# Patient Record
Sex: Male | Born: 1960 | Race: White | Hispanic: No | Marital: Married | Smoking: Current every day smoker
Health system: Southern US, Community
[De-identification: ages and names within clinical notes are randomized; demographics above are authoritative.]

## PROBLEM LIST (undated history)

## (undated) DIAGNOSIS — D126 Benign neoplasm of colon, unspecified: Secondary | ICD-10-CM

## (undated) DIAGNOSIS — N400 Enlarged prostate without lower urinary tract symptoms: Secondary | ICD-10-CM

## (undated) DIAGNOSIS — E039 Hypothyroidism, unspecified: Secondary | ICD-10-CM

## (undated) DIAGNOSIS — K222 Esophageal obstruction: Secondary | ICD-10-CM

## (undated) DIAGNOSIS — K449 Diaphragmatic hernia without obstruction or gangrene: Secondary | ICD-10-CM

## (undated) DIAGNOSIS — M1990 Unspecified osteoarthritis, unspecified site: Secondary | ICD-10-CM

## (undated) DIAGNOSIS — I639 Cerebral infarction, unspecified: Secondary | ICD-10-CM

## (undated) DIAGNOSIS — D0462 Carcinoma in situ of skin of left upper limb, including shoulder: Secondary | ICD-10-CM

## (undated) DIAGNOSIS — K219 Gastro-esophageal reflux disease without esophagitis: Secondary | ICD-10-CM

## (undated) DIAGNOSIS — E785 Hyperlipidemia, unspecified: Secondary | ICD-10-CM

## (undated) DIAGNOSIS — M1712 Unilateral primary osteoarthritis, left knee: Secondary | ICD-10-CM

## (undated) DIAGNOSIS — I10 Essential (primary) hypertension: Secondary | ICD-10-CM

## (undated) DIAGNOSIS — Q613 Polycystic kidney, unspecified: Secondary | ICD-10-CM

## (undated) DIAGNOSIS — Z8673 Personal history of transient ischemic attack (TIA), and cerebral infarction without residual deficits: Secondary | ICD-10-CM

## (undated) DIAGNOSIS — K5790 Diverticulosis of intestine, part unspecified, without perforation or abscess without bleeding: Secondary | ICD-10-CM

## (undated) DIAGNOSIS — R911 Solitary pulmonary nodule: Principal | ICD-10-CM

## (undated) HISTORY — DX: Carcinoma in situ of skin of left upper limb, including shoulder: D04.62

## (undated) HISTORY — DX: Diverticulosis of intestine, part unspecified, without perforation or abscess without bleeding: K57.90

## (undated) HISTORY — DX: Cerebral infarction, unspecified: I63.9

## (undated) HISTORY — PX: COLONOSCOPY: SHX174

## (undated) HISTORY — DX: Diaphragmatic hernia without obstruction or gangrene: K44.9

## (undated) HISTORY — DX: Hyperlipidemia, unspecified: E78.5

## (undated) HISTORY — DX: Benign prostatic hyperplasia without lower urinary tract symptoms: N40.0

## (undated) HISTORY — PX: CARPAL TUNNEL RELEASE: SHX101

## (undated) HISTORY — DX: Esophageal obstruction: K22.2

## (undated) HISTORY — PX: NASAL POLYP EXCISION: SHX2068

## (undated) HISTORY — DX: Hypothyroidism, unspecified: E03.9

## (undated) HISTORY — DX: Benign neoplasm of colon, unspecified: D12.6

## (undated) HISTORY — DX: Personal history of transient ischemic attack (TIA), and cerebral infarction without residual deficits: Z86.73

## (undated) HISTORY — PX: POLYPECTOMY: SHX149

## (undated) HISTORY — DX: Gastro-esophageal reflux disease without esophagitis: K21.9

## (undated) HISTORY — DX: Solitary pulmonary nodule: R91.1

---

## 2002-12-26 DIAGNOSIS — Z8673 Personal history of transient ischemic attack (TIA), and cerebral infarction without residual deficits: Secondary | ICD-10-CM

## 2002-12-26 HISTORY — DX: Personal history of transient ischemic attack (TIA), and cerebral infarction without residual deficits: Z86.73

## 2003-12-27 HISTORY — PX: HERNIA REPAIR: SHX51

## 2008-08-17 ENCOUNTER — Emergency Department (HOSPITAL_BASED_OUTPATIENT_CLINIC_OR_DEPARTMENT_OTHER): Admission: EM | Admit: 2008-08-17 | Discharge: 2008-08-17 | Payer: Self-pay | Admitting: Emergency Medicine

## 2009-05-10 ENCOUNTER — Emergency Department (HOSPITAL_BASED_OUTPATIENT_CLINIC_OR_DEPARTMENT_OTHER): Admission: EM | Admit: 2009-05-10 | Discharge: 2009-05-10 | Payer: Self-pay | Admitting: Emergency Medicine

## 2009-09-06 ENCOUNTER — Emergency Department (HOSPITAL_BASED_OUTPATIENT_CLINIC_OR_DEPARTMENT_OTHER): Admission: EM | Admit: 2009-09-06 | Discharge: 2009-09-06 | Payer: Self-pay | Admitting: Emergency Medicine

## 2009-09-06 ENCOUNTER — Ambulatory Visit: Payer: Self-pay | Admitting: Interventional Radiology

## 2010-05-22 ENCOUNTER — Emergency Department (HOSPITAL_BASED_OUTPATIENT_CLINIC_OR_DEPARTMENT_OTHER): Admission: EM | Admit: 2010-05-22 | Discharge: 2010-05-22 | Payer: Self-pay | Admitting: Emergency Medicine

## 2010-05-22 ENCOUNTER — Ambulatory Visit: Payer: Self-pay | Admitting: Interventional Radiology

## 2010-09-13 ENCOUNTER — Ambulatory Visit: Payer: Self-pay | Admitting: Radiology

## 2010-09-13 ENCOUNTER — Emergency Department (HOSPITAL_BASED_OUTPATIENT_CLINIC_OR_DEPARTMENT_OTHER): Admission: EM | Admit: 2010-09-13 | Discharge: 2010-09-13 | Payer: Self-pay | Admitting: Emergency Medicine

## 2011-01-10 ENCOUNTER — Emergency Department (HOSPITAL_BASED_OUTPATIENT_CLINIC_OR_DEPARTMENT_OTHER)
Admission: EM | Admit: 2011-01-10 | Discharge: 2011-01-10 | Payer: Self-pay | Source: Home / Self Care | Admitting: Emergency Medicine

## 2011-01-12 LAB — BASIC METABOLIC PANEL
BUN: 18 mg/dL (ref 6–23)
CO2: 26 mEq/L (ref 19–32)
Calcium: 9.5 mg/dL (ref 8.4–10.5)
Chloride: 104 mEq/L (ref 96–112)
Creatinine, Ser: 0.9 mg/dL (ref 0.4–1.5)
GFR calc Af Amer: >60 mL/min (ref >60)
GFR calc non Af Amer: >60 mL/min (ref >60)
Glucose, Bld: 107 mg/dL — ABNORMAL HIGH (ref 70–99)
Potassium: 4.1 mEq/L (ref 3.5–5.1)
Sodium: 143 mEq/L (ref 135–145)

## 2011-03-10 LAB — URINALYSIS, ROUTINE W REFLEX MICROSCOPIC
Bilirubin Urine: NEGATIVE
Glucose, UA: NEGATIVE mg/dL
Hgb urine dipstick: NEGATIVE
Ketones, ur: NEGATIVE mg/dL
Nitrite: NEGATIVE
Protein, ur: NEGATIVE mg/dL
Specific Gravity, Urine: 1.006 (ref 1.005–1.030)
Urobilinogen, UA: 0.2 mg/dL (ref 0.0–1.0)
pH: 6.5 (ref 5.0–8.0)

## 2011-03-10 LAB — BASIC METABOLIC PANEL
BUN: 26 mg/dL — ABNORMAL HIGH (ref 6–23)
CO2: 24 mEq/L (ref 19–32)
Calcium: 10 mg/dL (ref 8.4–10.5)
Chloride: 102 mEq/L (ref 96–112)
Creatinine, Ser: 0.9 mg/dL (ref 0.4–1.5)
GFR calc Af Amer: >60 mL/min (ref >60)
GFR calc non Af Amer: >60 mL/min (ref >60)
Glucose, Bld: 120 mg/dL — ABNORMAL HIGH (ref 70–99)
Potassium: 4.2 mEq/L (ref 3.5–5.1)
Sodium: 139 mEq/L (ref 135–145)

## 2011-03-14 LAB — CBC
HCT: 47.5 % (ref 39.0–52.0)
Hemoglobin: 16.7 g/dL (ref 13.0–17.0)
MCHC: 35.1 g/dL (ref 30.0–36.0)
MCV: 86.8 fL (ref 78.0–100.0)
Platelets: 152 10*3/uL (ref 150–400)
RBC: 5.47 MIL/uL (ref 4.22–5.81)
RDW: 12.2 % (ref 11.5–15.5)
WBC: 5.5 10*3/uL (ref 4.0–10.5)

## 2011-03-14 LAB — BASIC METABOLIC PANEL
BUN: 18 mg/dL (ref 6–23)
CO2: 28 mEq/L (ref 19–32)
Calcium: 9.8 mg/dL (ref 8.4–10.5)
Chloride: 103 mEq/L (ref 96–112)
Creatinine, Ser: 0.9 mg/dL (ref 0.4–1.5)
GFR calc Af Amer: >60 mL/min (ref >60)
GFR calc non Af Amer: >60 mL/min (ref >60)
Glucose, Bld: 97 mg/dL (ref 70–99)
Potassium: 3.9 mEq/L (ref 3.5–5.1)
Sodium: 142 mEq/L (ref 135–145)

## 2011-03-14 LAB — DIFFERENTIAL
Basophils Absolute: 0 10*3/uL (ref 0.0–0.1)
Basophils Relative: 0 % (ref 0–1)
Eosinophils Absolute: 0 10*3/uL (ref 0.0–0.7)
Eosinophils Relative: 0 % (ref 0–5)
Lymphocytes Relative: 23 % (ref 12–46)
Lymphs Abs: 1.2 10*3/uL (ref 0.7–4.0)
Monocytes Absolute: 0.6 10*3/uL (ref 0.1–1.0)
Monocytes Relative: 10 % (ref 3–12)
Neutro Abs: 3.7 10*3/uL (ref 1.7–7.7)
Neutrophils Relative %: 66 % (ref 43–77)

## 2011-03-14 LAB — POCT CARDIAC MARKERS
CKMB, poc: 1.4 ng/mL (ref 1.0–8.0)
Myoglobin, poc: 82.7 ng/mL (ref 12–200)
Troponin i, poc: <0.05 ng/mL (ref 0.00–0.09)

## 2011-03-14 LAB — D-DIMER, QUANTITATIVE: D-Dimer, Quant: 0.33 ug/mL-FEU (ref 0.00–0.48)

## 2011-04-01 LAB — URINALYSIS, ROUTINE W REFLEX MICROSCOPIC
Bilirubin Urine: NEGATIVE
Glucose, UA: NEGATIVE mg/dL
Hgb urine dipstick: NEGATIVE
Ketones, ur: NEGATIVE mg/dL
Nitrite: NEGATIVE
Protein, ur: NEGATIVE mg/dL
Specific Gravity, Urine: 1.007 (ref 1.005–1.030)
Urobilinogen, UA: 0.2 mg/dL (ref 0.0–1.0)
pH: 7 (ref 5.0–8.0)

## 2011-06-22 ENCOUNTER — Other Ambulatory Visit: Payer: Self-pay | Admitting: Nephrology

## 2011-06-22 DIAGNOSIS — R109 Unspecified abdominal pain: Secondary | ICD-10-CM

## 2011-06-28 ENCOUNTER — Ambulatory Visit
Admission: RE | Admit: 2011-06-28 | Discharge: 2011-06-28 | Disposition: A | Discharge status: Home / Self Care | Payer: Medicare Other | Source: Ambulatory Visit | Attending: Nephrology | Admitting: Nephrology

## 2011-06-28 DIAGNOSIS — R109 Unspecified abdominal pain: Secondary | ICD-10-CM

## 2011-10-04 ENCOUNTER — Emergency Department (HOSPITAL_BASED_OUTPATIENT_CLINIC_OR_DEPARTMENT_OTHER): Payer: Medicare Other

## 2011-10-04 ENCOUNTER — Emergency Department (HOSPITAL_BASED_OUTPATIENT_CLINIC_OR_DEPARTMENT_OTHER)
Admission: EM | Admit: 2011-10-04 | Discharge: 2011-10-04 | Disposition: A | Discharge status: Home / Self Care | Payer: Medicare Other | Attending: Emergency Medicine | Admitting: Emergency Medicine

## 2011-10-04 ENCOUNTER — Emergency Department (INDEPENDENT_AMBULATORY_CARE_PROVIDER_SITE_OTHER): Payer: Medicare Other

## 2011-10-04 ENCOUNTER — Encounter: Payer: Self-pay | Admitting: Emergency Medicine

## 2011-10-04 DIAGNOSIS — M25469 Effusion, unspecified knee: Secondary | ICD-10-CM

## 2011-10-04 DIAGNOSIS — J45909 Unspecified asthma, uncomplicated: Secondary | ICD-10-CM | POA: Insufficient documentation

## 2011-10-04 DIAGNOSIS — M25569 Pain in unspecified knee: Secondary | ICD-10-CM | POA: Insufficient documentation

## 2011-10-04 HISTORY — DX: Polycystic kidney, unspecified: Q61.3

## 2011-10-04 MED ORDER — OXYCODONE-ACETAMINOPHEN 5-325 MG PO TABS
2.0000 | ORAL_TABLET | Freq: Once | ORAL | Status: AC
  Administered 2011-10-04: 2 via ORAL
  Filled 2011-10-04: qty 2

## 2011-10-04 MED ORDER — OXYCODONE-ACETAMINOPHEN 5-325 MG PO TABS: 2.0000 | ORAL_TABLET | ORAL | Status: AC | PRN

## 2011-10-04 NOTE — ED Provider Notes (Signed)
History     CSN: [id number] Arrival date & time: 10/04/2011  5:26 PM  Chief Complaint  Patient presents with  . Knee Pain    (Consider location/radiation/quality/duration/timing/severity/associated sxs/prior treatment) HPI This 50 year old white male overused his right knee a few days ago he was walking up and down bleachers and has had constant 24 hours a day of aching right knee pain with mild swelling and stiffness since that time without radiation or associated symptoms such as weakness or numbness. He had no falling no direct trauma to the knee he just use it much more than usual the other day. He has no thigh pain or swelling no calf pain or swelling no chest pain lightheadedness shortness breath or other concerns. He has not tried any prior treatment. Past Medical History  Diagnosis Date  . Asthma   . Polycystic kidney disease     Past Surgical History  Procedure Date  . Hernia repair     History reviewed. No pertinent family history.  History  Substance Use Topics  . Smoking status: Former Games developer  . Smokeless tobacco: Not on file  . Alcohol Use: Yes     rarely      Review of Systems  Constitutional: Negative for fever.       10 Systems reviewed and are negative for acute change except as noted in the HPI.  HENT: Negative for congestion.   Eyes: Negative for discharge and redness.  Respiratory: Negative for cough and shortness of breath.   Cardiovascular: Negative for chest pain.  Gastrointestinal: Negative for vomiting and abdominal pain.  Musculoskeletal: Positive for arthralgias. Negative for back pain.  Skin: Negative for rash.  Neurological: Negative for syncope, numbness and headaches.  Psychiatric/Behavioral:       No behavior change.    Allergies  Aspirin; Ibuprofen; and Keflex  Home Medications   Current Outpatient Rx  Name Route Sig Dispense Refill  . ALBUTEROL SULFATE HFA 108 (90 BASE) MCG/ACT IN AERS Inhalation Inhale 2 puffs into the lungs  every 6 (six) hours as needed.      Marland Kitchen FLUTICASONE-SALMETEROL 115-21 MCG/ACT IN AERO Inhalation Inhale 2 puffs into the lungs 2 (two) times daily.      Marland Kitchen LEVOTHYROXINE SODIUM 200 MCG PO TABS Oral Take 200 mcg by mouth daily.      Marland Kitchen LOVASTATIN 10 MG PO TABS Oral Take 10 mg by mouth at bedtime.      Marland Kitchen MONTELUKAST SODIUM 10 MG PO TABS Oral Take 10 mg by mouth at bedtime.      . OXYCODONE-ACETAMINOPHEN 5-325 MG PO TABS Oral Take 2 tablets by mouth every 4 (four) hours as needed for pain. 20 tablet 0    BP 136/88  Pulse 80  Temp(Src) 98 F (36.7 C) (Oral)  Resp 16  SpO2 96%  Physical Exam  Nursing note and vitals reviewed. Constitutional:       Awake, alert, nontoxic appearance.  HENT:  Head: Atraumatic.  Eyes: Right eye exhibits no discharge. Left eye exhibits no discharge.  Neck: Neck supple.  Cardiovascular: Normal rate and regular rhythm.   No murmur heard. Pulmonary/Chest: Effort normal and breath sounds normal. No respiratory distress. He has no wheezes. He has no rales. He exhibits no tenderness.  Abdominal: Soft. Bowel sounds are normal. There is no tenderness. There is no rebound.  Musculoskeletal: He exhibits tenderness. He exhibits no edema.       Baseline ROM and no obvious new focal weakness to his arms or left  leg. His right leg is no tenderness to the hip thigh calf ankle or foot. His right foot has normal light touch with dorsalis pedis pulse intact and capillary refill less than 2 seconds with normal light touch and good movement of his right foot with intact strength. His right knee has mild swelling to the superior and lateral aspects consistent with an effusion without erythema or induration. His patella is nontender he has full extension but decreased flexion and cannot flex to 90 due to pain. He is negative Lachman's testing, positive McMurray's testing, his ligaments appear stable and without pain tenderness and valgus stress testing, which his exam most consistent with  an internal derangement from suspected cartilage injury. Clinically I doubt deep venous thrombosis. Limited bedside emergency department ultrasound reveals effusion to the right knee over a swollen supra-patellar and lateral to patellar regions.  Neurological:       Mental status and motor strength appears baseline for patient and situation.  Skin: No rash noted.  Psychiatric: He has a normal mood and affect.    ED Course  Procedures (including critical care time)  Labs Reviewed - No data to display No results found.   1. Knee pain, acute       MDM  I doubt any other EMC precluding discharge at this time including, but not necessarily limited to the following:septic joint.        Hurman Horn, MD 10/11/11 [phone]

## 2011-10-04 NOTE — ED Notes (Addendum)
Pt states he is having right knee pain since yesterday.  Pt states he has a lump on right knee.  Noted swollen soft area to knee.  Painful ambulation.  Recent trip to [facility].  No SOB.

## 2011-12-13 ENCOUNTER — Ambulatory Visit: Payer: Medicare Other | Admitting: Family

## 2011-12-13 ENCOUNTER — Ambulatory Visit (INDEPENDENT_AMBULATORY_CARE_PROVIDER_SITE_OTHER): Payer: Medicare Other | Admitting: Family

## 2011-12-13 ENCOUNTER — Encounter: Payer: Self-pay | Admitting: Family

## 2011-12-13 DIAGNOSIS — J45909 Unspecified asthma, uncomplicated: Secondary | ICD-10-CM | POA: Insufficient documentation

## 2011-12-13 DIAGNOSIS — Q613 Polycystic kidney, unspecified: Secondary | ICD-10-CM

## 2011-12-13 DIAGNOSIS — K219 Gastro-esophageal reflux disease without esophagitis: Secondary | ICD-10-CM

## 2011-12-13 DIAGNOSIS — N400 Enlarged prostate without lower urinary tract symptoms: Secondary | ICD-10-CM

## 2011-12-13 DIAGNOSIS — E039 Hypothyroidism, unspecified: Secondary | ICD-10-CM

## 2011-12-13 DIAGNOSIS — E785 Hyperlipidemia, unspecified: Secondary | ICD-10-CM

## 2011-12-13 LAB — HEPATIC FUNCTION PANEL
ALT: 28 U/L (ref 0–53)
Albumin: 4.6 g/dL (ref 3.5–5.2)
Alkaline Phosphatase: 76 U/L (ref 39–117)
Indirect Bilirubin: 0.5 mg/dL (ref 0.0–0.9)
Total Protein: 7 g/dL (ref 6.0–8.3)

## 2011-12-13 LAB — BASIC METABOLIC PANEL
CO2: 25 mEq/L (ref 19–32)
Chloride: 106 mEq/L (ref 96–112)
Creat: 0.88 mg/dL (ref 0.50–1.35)
Glucose, Bld: 98 mg/dL (ref 70–99)
Sodium: 137 mEq/L (ref 135–145)

## 2011-12-13 NOTE — Assessment & Plan Note (Signed)
Stable on PPI 

## 2011-12-13 NOTE — Assessment & Plan Note (Signed)
Stable on advair, singulair, albuterol prn.  Monitor.

## 2011-12-13 NOTE — Assessment & Plan Note (Signed)
On Flomax. Clinically stable. Will obtain glucose to exclude DM given his history of urinary frequency.

## 2011-12-13 NOTE — Assessment & Plan Note (Signed)
This is being managed by Dr. Abel Presto.  Obtain  BMET.

## 2011-12-13 NOTE — Assessment & Plan Note (Signed)
On synthroid, obtain TSH. 

## 2011-12-13 NOTE — Progress Notes (Signed)
Subjective:    Patient ID: Kyle Blair, male    DOB: 29-Jul-1961, 50 y.o.   MRN: [id number]  HPI  Mr.  Kyle Blair is a 50 year old male who presents today to establish care.    He was previously followed by Dr. Wende Bushy who relocated.    Asthma- He is maintained on Singulair, advair and PRN albuterol.  He reports that he often has flares of his Asthma due to URIs.  He has had 2-3 hospitalizations in the last 10 yrs.  Reports that this is currently stable.    Polycystic kidney disease-  He reports that this is currently being monitored by Dr. Abel Presto.  He has blood work drawn every 6 months.  Next apt is in February.    GERD- He is maintained on prevacid. Reports + hx of esophageal stricture. Last dilation was in October.  He denies dysphagia.  He is followed by Dr. [name] Levels.  He follows with him once a year.    Hypothyroid- his is maintained on levothyroxine. Reports that this was last checked about 2 months ago and was normal.   Hyperlipidemia-  He is maintained on Lovastatin.  He denies myalgias.  BPH- he is maintained on flomax.  He reports sometimes has hesitancy.  Notes some improvement in his symptoms since he started it back in September.  He reports that his nocturia is 2x a night.  Was worse.     Review of Systems  Constitutional:       Reports recent 10 pound weight gain  HENT: Negative for hearing loss.   Eyes: Negative for visual disturbance.  Respiratory: Negative for cough and shortness of breath.   Cardiovascular: Negative for leg swelling.  Gastrointestinal: Negative for nausea, vomiting and diarrhea.  Genitourinary: Positive for frequency. Negative for dysuria.  Musculoskeletal: Negative for myalgias and arthralgias.  Skin: Negative for rash.       Reports occasional hives in the spring which he has had since a child and is relieved by benadryl  Neurological:       Occasional headaches.  Hematological: Negative for adenopathy.  Psychiatric/Behavioral:       He  denies history of depression.  Occasional anxiety related to finances.    Past Medical History  Diagnosis Date  . Asthma   . Polycystic kidney disease   . Hypothyroid   . GERD with stricture   . BPH (benign prostatic hypertrophy)   . Hyperlipidemia     History   Social History  . Marital Status: Married    Spouse Name: N/A    Number of Children: 2  . Years of Education: N/A   Occupational History  . DISABLED    Social History Main Topics  . Smoking status: Former Games developer  . Smokeless tobacco: Not on file  . Alcohol Use: Yes     rarely  . Drug Use: No  . Sexually Active: Not on file   Other Topics Concern  . Not on file   Social History Narrative   Caffeine use:  1 pot coffee daily, 1 glass teaRegular exercise:  No. 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      Past Surgical History  Procedure Date  . Hernia repair 2005    with mesh  . Nasal polyp excision 1992 or 93    Family History  Problem Relation Age of Onset  . Heart disease Mother  pacemaker  . Diabetes Father   . Kidney disease Father   . Diabetes Sister     Allergies  Allergen Reactions  . Aspirin Anaphylaxis  . Ibuprofen Anaphylaxis  . Keflex Rash    Current Outpatient Prescriptions on File Prior to Visit  Medication Sig Dispense Refill  . albuterol (PROVENTIL HFA;VENTOLIN HFA) 108 (90 BASE) MCG/ACT inhaler Inhale 2 puffs into the lungs every 6 (six) hours as needed.        . montelukast (SINGULAIR) 10 MG tablet Take 10 mg by mouth at bedtime.          BP 130/96  Pulse 78  Temp(Src) 98 F (36.7 C) (Oral)  Resp 16  Ht 5' 5.5" (1.664 m)  Wt 228 lb 1.9 oz (103.475 kg)  BMI 37.38 kg/m2       Objective:   Physical Exam  Constitutional: He appears well-developed and well-nourished. No distress.  HENT:  Head: Normocephalic and atraumatic.  Eyes: Conjunctivae are normal. Pupils  are equal, round, and reactive to light.  Cardiovascular: Normal rate and regular rhythm.   No murmur heard. Pulmonary/Chest: Effort normal and breath sounds normal. No respiratory distress. He has no wheezes. He has no rales. He exhibits no tenderness.  Abdominal: Soft. Bowel sounds are normal. He exhibits no distension. There is no tenderness. There is no rebound and no guarding.  Musculoskeletal: He exhibits no edema.  Lymphadenopathy:    He has no cervical adenopathy.  Skin: Skin is warm and dry.  Psychiatric: He has a normal mood and affect. His behavior is normal. Judgment and thought content normal.          Assessment & Plan:

## 2011-12-13 NOTE — Assessment & Plan Note (Signed)
Tolerating statin.  Obtain LFT's.  Will also request his records from Dr. Chase Picket office as he tells me that he has hepatic cysts in addition to renal cysts.

## 2011-12-13 NOTE — Patient Instructions (Signed)
Please complete your lab work prior to leaving today. Follow up in 1 month for a fasting physical. Welcome to [facility]!

## 2011-12-14 ENCOUNTER — Encounter: Payer: Self-pay | Admitting: Family

## 2012-01-05 ENCOUNTER — Telehealth: Payer: Self-pay | Admitting: *Deleted

## 2012-01-05 NOTE — Telephone Encounter (Signed)
Received records from Dr Chase Picket office from 01/06/09 to 12/12/11 and forwarded to Provider for review.

## 2012-01-20 ENCOUNTER — Telehealth: Payer: Self-pay | Admitting: Family

## 2012-01-20 DIAGNOSIS — R7309 Other abnormal glucose: Secondary | ICD-10-CM

## 2012-01-20 NOTE — Telephone Encounter (Signed)
Pls let patient know that I reviewed lab work from kidney doctor. Sugar was up a bit- 119  I would like for him to complete an A1C to check him for diabetes please.  Diagnosis hyperglycemia.

## 2012-01-20 NOTE — Telephone Encounter (Signed)
Notified pt and placed future lab order for Tuesday next week. Copy has been forwarded to the lab.

## 2012-01-24 NOTE — Telephone Encounter (Signed)
Pt presented to the lab and order was released. 

## 2012-01-24 NOTE — Telephone Encounter (Signed)
Addended by: Isbella Arline A on: 12/03/2012 09:33 AM   Modules accepted: Orders  

## 2012-01-25 ENCOUNTER — Encounter: Payer: Self-pay | Admitting: Family

## 2012-01-28 ENCOUNTER — Emergency Department (INDEPENDENT_AMBULATORY_CARE_PROVIDER_SITE_OTHER): Payer: Medicare Other

## 2012-01-28 ENCOUNTER — Encounter (HOSPITAL_BASED_OUTPATIENT_CLINIC_OR_DEPARTMENT_OTHER): Payer: Self-pay | Admitting: *Deleted

## 2012-01-28 ENCOUNTER — Emergency Department (HOSPITAL_BASED_OUTPATIENT_CLINIC_OR_DEPARTMENT_OTHER)
Admission: EM | Admit: 2012-01-28 | Discharge: 2012-01-28 | Disposition: A | Discharge status: Home / Self Care | Payer: Medicare Other | Attending: Emergency Medicine | Admitting: Emergency Medicine

## 2012-01-28 DIAGNOSIS — K219 Gastro-esophageal reflux disease without esophagitis: Secondary | ICD-10-CM | POA: Insufficient documentation

## 2012-01-28 DIAGNOSIS — E039 Hypothyroidism, unspecified: Secondary | ICD-10-CM | POA: Insufficient documentation

## 2012-01-28 DIAGNOSIS — E785 Hyperlipidemia, unspecified: Secondary | ICD-10-CM | POA: Insufficient documentation

## 2012-01-28 DIAGNOSIS — I289 Disease of pulmonary vessels, unspecified: Secondary | ICD-10-CM

## 2012-01-28 DIAGNOSIS — J45909 Unspecified asthma, uncomplicated: Secondary | ICD-10-CM | POA: Insufficient documentation

## 2012-01-28 DIAGNOSIS — R0602 Shortness of breath: Secondary | ICD-10-CM | POA: Insufficient documentation

## 2012-01-28 DIAGNOSIS — R0989 Other specified symptoms and signs involving the circulatory and respiratory systems: Secondary | ICD-10-CM

## 2012-01-28 MED ORDER — AZITHROMYCIN 250 MG PO TABS: 250.0000 mg | ORAL_TABLET | Freq: Every day | ORAL | Status: AC

## 2012-01-28 MED ORDER — PREDNISONE 50 MG PO TABS
60.0000 mg | ORAL_TABLET | Freq: Once | ORAL | Status: AC
  Administered 2012-01-28: 60 mg via ORAL
  Filled 2012-01-28: qty 1

## 2012-01-28 MED ORDER — IPRATROPIUM BROMIDE 0.02 % IN SOLN
0.5000 mg | Freq: Once | RESPIRATORY_TRACT | Status: DC
  Filled 2012-01-28: qty 2.5

## 2012-01-28 MED ORDER — ALBUTEROL SULFATE (5 MG/ML) 0.5% IN NEBU
5.0000 mg | INHALATION_SOLUTION | Freq: Once | RESPIRATORY_TRACT | Status: AC
  Administered 2012-01-28: 5 mg via RESPIRATORY_TRACT
  Filled 2012-01-28: qty 1

## 2012-01-28 MED ORDER — PREDNISONE 20 MG PO TABS: 40.0000 mg | ORAL_TABLET | Freq: Every day | ORAL | Status: DC

## 2012-01-28 MED ORDER — ALBUTEROL SULFATE (5 MG/ML) 0.5% IN NEBU
INHALATION_SOLUTION | RESPIRATORY_TRACT | Status: AC
  Administered 2012-01-28: 5 mg via RESPIRATORY_TRACT
  Filled 2012-01-28: qty 1

## 2012-01-28 MED ORDER — ALBUTEROL SULFATE (5 MG/ML) 0.5% IN NEBU
5.0000 mg | INHALATION_SOLUTION | Freq: Once | RESPIRATORY_TRACT | Status: AC
  Administered 2012-01-28: 5 mg via RESPIRATORY_TRACT

## 2012-01-28 NOTE — ED Provider Notes (Signed)
History     CSN: [id number]  Arrival date & time 01/28/12  4098   First MD Initiated Contact with Patient 01/28/12 1817      Chief Complaint  Patient presents with  . Shortness of Breath    (Consider location/radiation/quality/duration/timing/severity/associated sxs/prior treatment) HPI Comments: Pt states that he has been having increased sob and his inhaler isn't working:pt states that he had a cold and productive cough prior to this starting  Patient is a 51 y.o. male presenting with shortness of breath. The history is provided by the patient. No language interpreter was used.  Shortness of Breath  The current episode started more than 1 week ago. The problem occurs continuously. The problem has been gradually worsening. The problem is moderate. The symptoms are aggravated by nothing. Associated symptoms include cough and shortness of breath. Pertinent negatives include no chest pressure and no fever. He has not inhaled smoke recently. He has had intermittent steroid use. He has had no prior hospitalizations. He has had no prior ICU admissions. He has had no prior intubations.    Past Medical History  Diagnosis Date  . Asthma   . Polycystic kidney disease   . Hypothyroid   . GERD with stricture   . BPH (benign prostatic hypertrophy)   . Hyperlipidemia     Past Surgical History  Procedure Date  . Hernia repair 2005    with mesh  . Nasal polyp excision 1992 or 93    Family History  Problem Relation Age of Onset  . Heart disease Mother     pacemaker  . Diabetes Father   . Kidney disease Father   . Diabetes Sister     History  Substance Use Topics  . Smoking status: Former Games developer  . Smokeless tobacco: Not on file  . Alcohol Use: Yes     rarely      Review of Systems  Constitutional: Negative for fever.  Respiratory: Positive for cough and shortness of breath.   All other systems reviewed and are negative.    Allergies  Aspirin; Ibuprofen; Peanut butter  flavor; and Keflex  Home Medications   Current Outpatient Rx  Name Route Sig Dispense Refill  . ALBUTEROL SULFATE HFA 108 (90 BASE) MCG/ACT IN AERS Inhalation Inhale 2 puffs into the lungs every 6 (six) hours as needed. For shortness of breath and wheezing    . ALBUTEROL SULFATE (2.5 MG/3ML) 0.083% IN NEBU Nebulization Take 2.5 mg by nebulization every 6 (six) hours as needed. For shortness of breath and wheezing    . FLUTICASONE-SALMETEROL 250-50 MCG/DOSE IN AEPB Inhalation Inhale 1 puff into the lungs every 12 (twelve) hours.      Marland Kitchen LANSOPRAZOLE 30 MG PO CPDR Oral Take 30 mg by mouth daily.      Marland Kitchen LEVOTHYROXINE SODIUM 175 MCG PO TABS Oral Take 175 mcg by mouth daily.      Marland Kitchen LOVASTATIN 40 MG PO TABS Oral Take 40 mg by mouth at bedtime.      Marland Kitchen MONTELUKAST SODIUM 10 MG PO TABS Oral Take 10 mg by mouth at bedtime.      . TAMSULOSIN HCL 0.4 MG PO CAPS Oral Take 0.4 mg by mouth at bedtime.        BP 172/116  Pulse 90  Temp(Src) 97.9 F (36.6 C) (Oral)  Resp 28  Ht 5\' 6"  (1.676 m)  Wt 220 lb (99.791 kg)  BMI 35.51 kg/m2  SpO2 98%  Physical Exam  Nursing note and vitals  reviewed. Constitutional: He is oriented to person, place, and time. He appears well-developed and well-nourished.  HENT:  Head: Normocephalic and atraumatic.  Right Ear: External ear normal.  Left Ear: External ear normal.  Neck: Neck supple.  Cardiovascular: Normal rate and regular rhythm.   Pulmonary/Chest: No respiratory distress.       Decreased lung sound throughout  Abdominal: Soft. Bowel sounds are normal. There is no tenderness.  Musculoskeletal: Normal range of motion. He exhibits no edema.  Neurological: He is alert and oriented to person, place, and time.  Skin: Skin is warm and dry.  Psychiatric: He has a normal mood and affect.    ED Course  Procedures (including critical care time)  Labs Reviewed - No data to display Dg Chest 2 View  01/28/2012  *RADIOLOGY REPORT*  Clinical Data: Shortness of  breath.  Chest congestion.  CHEST - 2 VIEW  Comparison: 01/10/2011  Findings: Heart size is normal.  However, there is pulmonary vascular congestion with distention of the azygos vein.  No acute infiltrates or effusions.  No acute osseous abnormality.  IMPRESSION: Pulmonary vascular congestion.  Original Report Authenticated By: Gwynn Burly, M.D.     1. Asthma       MDM  Pt feeling better after the treatment:clinically concern for chf low as pt has no crackles and is not edematous:x-ray today compared with old is unchanged        Teressa Lower, NP 01/28/12 2034

## 2012-01-28 NOTE — ED Notes (Signed)
Lonna Cobb, NP informed of patient's RA sat, orders placed for albuterol tx

## 2012-01-28 NOTE — ED Notes (Signed)
Pt states he has a hx of asthma and this episode has been coming on for a few days. "Now getting to where I can't walk to the BR." No relief with usual meds. Also c/o pain with deep inspiration and hands and feet feel numb. Have had to be admitted befoer for this.

## 2012-01-29 NOTE — ED Provider Notes (Signed)
Medical screening examination/treatment/procedure(s) were performed by non-physician practitioner and as supervising physician I was immediately available for consultation/collaboration.  Gerhard Munch, MD 01/29/12 [phone]

## 2012-01-30 ENCOUNTER — Telehealth: Payer: Self-pay | Admitting: Family

## 2012-01-30 NOTE — Telephone Encounter (Signed)
Please call pt to arrange a 1 week follow up apt from ED visit for his asthma.

## 2012-01-31 ENCOUNTER — Ambulatory Visit (INDEPENDENT_AMBULATORY_CARE_PROVIDER_SITE_OTHER): Payer: Medicare Other | Admitting: Family

## 2012-01-31 ENCOUNTER — Encounter: Payer: Self-pay | Admitting: Family

## 2012-01-31 DIAGNOSIS — J45901 Unspecified asthma with (acute) exacerbation: Secondary | ICD-10-CM

## 2012-01-31 DIAGNOSIS — J45909 Unspecified asthma, uncomplicated: Secondary | ICD-10-CM

## 2012-01-31 MED ORDER — PREDNISONE 10 MG PO TABS: 10.0000 mg | ORAL_TABLET | Freq: Every day | ORAL | Status: DC

## 2012-01-31 MED ORDER — FUROSEMIDE 20 MG PO TABS: 20.0000 mg | ORAL_TABLET | Freq: Every day | ORAL | Status: DC

## 2012-01-31 MED ORDER — METHYLPREDNISOLONE SODIUM SUCC 125 MG IJ SOLR: 125.0000 mg | Freq: Once | INTRAMUSCULAR | Status: DC

## 2012-01-31 NOTE — Patient Instructions (Signed)
Change to advair 500/50 twice daily for the next week then you can return to the 250/50 dose. Furosemide 20mg  once daily for 3 days- until I see you back in the office.   Prednisone 40mg  tomorrow, then 30mg  once daily for 3 days, then 20mg  once daily for 3 days, then 10 mg once daily for 3 days then stop. Complete zithromax, continue your albuterol nebulizer every 4-6 hours.  Follow up in the office on Friday.  Go to ER if worsening shortness of breath.

## 2012-01-31 NOTE — Progress Notes (Signed)
Subjective:    Patient ID: Kyle Blair, male    DOB: February 27, 1961, 51 y.o.   MRN: [id number]  HPI  [name] is a 51 yr old male who presents today for follow up.  He was seen in the ED on 2/2 for acute asthma exacerbation. Was prescribed zithromax and prednisone taper.  He reports "not feeling much better." Feels weak, has ongoing wheezing and shortness of breath. Shortness of breath is worse with exertion.  Denies associated fever.  Does have associated cough which is dry. CXR in the ED is reviewed and noted no infiltrates but some pulmonary vascular congestion. He has been using his albuterol nebulizer at home- used 4-5 times yesterday with brief improvement.       Review of Systems See HPI  Past Medical History  Diagnosis Date  . Asthma   . Polycystic kidney disease   . Hypothyroid   . GERD with stricture   . BPH (benign prostatic hypertrophy)   . Hyperlipidemia     History   Social History  . Marital Status: Married    Spouse Name: N/A    Number of Children: 2  . Years of Education: N/A   Occupational History  . DISABLED    Social History Main Topics  . Smoking status: Former Games developer  . Smokeless tobacco: Not on file  . Alcohol Use: Yes     rarely  . Drug Use: No  . Sexually Active: Not on file   Other Topics Concern  . Not on file   Social History Narrative   Caffeine use:  1 pot coffee daily, 1 glass teaRegular exercise:  No. 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      Past Surgical History  Procedure Date  . Hernia repair 2005    with mesh  . Nasal polyp excision 1992 or 93    Family History  Problem Relation Age of Onset  . Heart disease Mother     pacemaker  . Diabetes Father   . Kidney disease Father   . Diabetes Sister     Allergies  Allergen Reactions  . Aspirin Anaphylaxis  . Ibuprofen Anaphylaxis  . Peanut Butter Flavor  Anaphylaxis  . Keflex Rash    Current Outpatient Prescriptions on File Prior to Visit  Medication Sig Dispense Refill  . albuterol (PROVENTIL HFA;VENTOLIN HFA) 108 (90 BASE) MCG/ACT inhaler Inhale 2 puffs into the lungs every 6 (six) hours as needed. For shortness of breath and wheezing      . albuterol (PROVENTIL) (2.5 MG/3ML) 0.083% nebulizer solution Take 2.5 mg by nebulization every 6 (six) hours as needed. For shortness of breath and wheezing      . azithromycin (ZITHROMAX) 250 MG tablet Take 1 tablet (250 mg total) by mouth daily. Take first 2 tablets together, then 1 every day until finished.  6 tablet  0  . Fluticasone-Salmeterol (ADVAIR) 250-50 MCG/DOSE AEPB Inhale 1 puff into the lungs every 12 (twelve) hours.        . lansoprazole (PREVACID) 30 MG capsule Take 30 mg by mouth daily.        Marland Kitchen levothyroxine (SYNTHROID, LEVOTHROID) 175 MCG tablet Take 175 mcg by mouth daily.        Marland Kitchen lovastatin (MEVACOR) 40 MG tablet Take 40 mg by mouth at bedtime.        . montelukast (SINGULAIR) 10 MG tablet Take 10  mg by mouth at bedtime.        . predniSONE (DELTASONE) 20 MG tablet Take 2 tablets (40 mg total) by mouth daily.  10 tablet  0  . Tamsulosin HCl (FLOMAX) 0.4 MG CAPS Take 0.4 mg by mouth at bedtime.          BP 108/78  Pulse 83  Temp(Src) 97.5 F (36.4 C) (Oral)  Resp 16  Wt 231 lb 0.6 oz (104.799 kg)  SpO2 95%       Objective:   Physical Exam  Constitutional: He appears well-developed and well-nourished. No distress.  HENT:  Head: Normocephalic and atraumatic.  Right Ear: Tympanic membrane and ear canal normal.  Left Ear: Tympanic membrane and ear canal normal.  Mouth/Throat: No posterior oropharyngeal edema or posterior oropharyngeal erythema.  Neck: Normal range of motion. Neck supple.  Cardiovascular: Normal rate and regular rhythm.   No murmur heard. Pulmonary/Chest: No accessory muscle usage. Not tachypneic. No respiratory distress. He has wheezes in the right upper  field and the right middle field. He has no rhonchi. He has no rales.          Assessment & Plan:

## 2012-01-31 NOTE — Assessment & Plan Note (Signed)
Unchanged.  Will extend prednisone taper as outlined in AVS.  Increase Advair to 500/50 bid for the next week, IM solumedrol given in office.  Try gentle diuresis to see if this will help with his SOB given note of congestion on CXR.   Close follow up on Friday- pt instructed to go to the ED if symptoms worsen and he verbalizes understanding.

## 2012-01-31 NOTE — Telephone Encounter (Signed)
Pt was evaluated in the office today.  ?

## 2012-02-02 DIAGNOSIS — J45901 Unspecified asthma with (acute) exacerbation: Secondary | ICD-10-CM

## 2012-02-02 MED ORDER — METHYLPREDNISOLONE SODIUM SUCC 125 MG IJ SOLR
125.0000 mg | Freq: Once | INTRAMUSCULAR | Status: AC
  Administered 2012-02-02: 125 mg via INTRAMUSCULAR

## 2012-02-02 NOTE — Progress Notes (Signed)
Addended by: Mervin Kung A on: 02/02/2012 02:17 PM   Modules accepted: Orders

## 2012-02-03 ENCOUNTER — Encounter: Payer: Self-pay | Admitting: Family

## 2012-02-03 ENCOUNTER — Telehealth: Payer: Self-pay | Admitting: Family

## 2012-02-03 ENCOUNTER — Ambulatory Visit (HOSPITAL_BASED_OUTPATIENT_CLINIC_OR_DEPARTMENT_OTHER)
Admission: RE | Admit: 2012-02-03 | Discharge: 2012-02-03 | Disposition: A | Discharge status: Home / Self Care | Payer: Medicare Other | Source: Ambulatory Visit | Attending: Family | Admitting: Family

## 2012-02-03 ENCOUNTER — Ambulatory Visit (INDEPENDENT_AMBULATORY_CARE_PROVIDER_SITE_OTHER): Payer: Medicare Other | Admitting: Family

## 2012-02-03 VITALS — BP 144/80 | HR 71 | Temp 97.7°F | Resp 16 | Wt 232.0 lb

## 2012-02-03 DIAGNOSIS — R0989 Other specified symptoms and signs involving the circulatory and respiratory systems: Secondary | ICD-10-CM

## 2012-02-03 DIAGNOSIS — R911 Solitary pulmonary nodule: Secondary | ICD-10-CM

## 2012-02-03 DIAGNOSIS — R0602 Shortness of breath: Secondary | ICD-10-CM

## 2012-02-03 DIAGNOSIS — R0609 Other forms of dyspnea: Secondary | ICD-10-CM | POA: Insufficient documentation

## 2012-02-03 DIAGNOSIS — J45901 Unspecified asthma with (acute) exacerbation: Secondary | ICD-10-CM

## 2012-02-03 DIAGNOSIS — R071 Chest pain on breathing: Secondary | ICD-10-CM | POA: Insufficient documentation

## 2012-02-03 DIAGNOSIS — B370 Candidal stomatitis: Secondary | ICD-10-CM

## 2012-02-03 DIAGNOSIS — J984 Other disorders of lung: Secondary | ICD-10-CM | POA: Insufficient documentation

## 2012-02-03 HISTORY — DX: Solitary pulmonary nodule: R91.1

## 2012-02-03 MED ORDER — IOHEXOL 350 MG/ML SOLN
80.0000 mL | Freq: Once | INTRAVENOUS | Status: AC | PRN
  Administered 2012-02-03: 80 mL via INTRAVENOUS

## 2012-02-03 NOTE — Telephone Encounter (Signed)
Spoke with pt. Reviewed CT result and plan for f/u CT in 3-6 mos. He verbalizes understanding.

## 2012-02-03 NOTE — Progress Notes (Signed)
Subjective:    Patient ID: Kyle Blair, male    DOB: 12-23-1961, 51 y.o.   MRN: [id number]  HPI  Pt presents today for follow up of his asthma.  He reports that his asthma symptoms are improving but improved.  Notes that he feels winded when he tries to do the stairs. He continues the prednisone taper.  He continues albuterol neb tid.  Denies fevers or mucous.  He reports that he has gained 7 pounds over the last few weeks.  He has been taking furosemide.  He completed the zithromax.  Review of Systems see HPI    Past Medical History  Diagnosis Date  . Asthma   . Polycystic kidney disease   . Hypothyroid   . GERD with stricture   . BPH (benign prostatic hypertrophy)   . Hyperlipidemia   . Lung nodule 02/03/2012    History   Social History  . Marital Status: Married    Spouse Name: N/A    Number of Children: 2  . Years of Education: N/A   Occupational History  . DISABLED    Social History Main Topics  . Smoking status: Former Games developer  . Smokeless tobacco: Not on file  . Alcohol Use: Yes     rarely  . Drug Use: No  . Sexually Active: Not on file   Other Topics Concern  . Not on file   Social History Narrative   Caffeine use:  1 pot coffee daily, 1 glass teaRegular exercise:  No. 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      Past Surgical History  Procedure Date  . Hernia repair 2005    with mesh  . Nasal polyp excision 1992 or 93    Family History  Problem Relation Age of Onset  . Heart disease Mother     pacemaker  . Diabetes Father   . Kidney disease Father   . Diabetes Sister     Allergies  Allergen Reactions  . Aspirin Anaphylaxis  . Ibuprofen Anaphylaxis  . Peanut Butter Flavor Anaphylaxis  . Keflex Rash    Current Outpatient Prescriptions on File Prior to Visit  Medication Sig Dispense Refill  . albuterol (PROVENTIL HFA;VENTOLIN HFA) 108  (90 BASE) MCG/ACT inhaler Inhale 2 puffs into the lungs every 6 (six) hours as needed. For shortness of breath and wheezing      . albuterol (PROVENTIL) (2.5 MG/3ML) 0.083% nebulizer solution Take 2.5 mg by nebulization every 6 (six) hours as needed. For shortness of breath and wheezing      . Fluticasone-Salmeterol (ADVAIR) 250-50 MCG/DOSE AEPB Inhale 1 puff into the lungs every 12 (twelve) hours.        . lansoprazole (PREVACID) 30 MG capsule Take 30 mg by mouth daily.        Marland Kitchen levothyroxine (SYNTHROID, LEVOTHROID) 175 MCG tablet Take 175 mcg by mouth daily.        Marland Kitchen lovastatin (MEVACOR) 40 MG tablet Take 40 mg by mouth at bedtime.        . montelukast (SINGULAIR) 10 MG tablet Take 10 mg by mouth at bedtime.        . predniSONE (DELTASONE) 10 MG tablet Take 1 tablet (10 mg total) by mouth daily.  10 tablet  0  . Tamsulosin HCl (FLOMAX) 0.4 MG CAPS Take 0.4 mg by mouth at bedtime.        Marland Kitchen azithromycin (  ZITHROMAX) 250 MG tablet Take 1 tablet (250 mg total) by mouth daily. Take first 2 tablets together, then 1 every day until finished.  6 tablet  0   No current facility-administered medications on file prior to visit.    BP 144/80  Pulse 71  Temp(Src) 97.7 F (36.5 C) (Oral)  Resp 16  Wt 232 lb 0.6 oz (105.253 kg)  SpO2 95%    Objective:   Physical Exam  Constitutional: He appears well-developed and well-nourished. No distress.  Cardiovascular: Normal rate and regular rhythm.   No murmur heard. Pulmonary/Chest: Effort normal and breath sounds normal. No respiratory distress. He has no wheezes. He has no rales. He exhibits no tenderness.  Musculoskeletal: He exhibits no edema.  Skin: Skin is warm and dry.  Psychiatric: He has a normal mood and affect. His behavior is normal. Judgment and thought content normal.          Assessment & Plan:

## 2012-02-03 NOTE — Assessment & Plan Note (Addendum)
Due to slow improvement, a CTA chest was obtained to rule out PE.  CT neg for PE but did not lung nodule.  Recommended that  Pt complete pred taper, albuterol, advair. Stop furosemide.

## 2012-02-03 NOTE — Assessment & Plan Note (Signed)
New, noted on CT today. Pulmonary nodule in the left lung measures 7.4 mm.  Needs follow up ct in 5/13.

## 2012-02-03 NOTE — Patient Instructions (Addendum)
Please complete your CT scan on the first floor. We will contact you with your results. 

## 2012-02-06 ENCOUNTER — Telehealth: Payer: Self-pay | Admitting: *Deleted

## 2012-02-06 DIAGNOSIS — B370 Candidal stomatitis: Secondary | ICD-10-CM | POA: Insufficient documentation

## 2012-02-06 MED ORDER — NYSTATIN 100000 UNIT/ML MT SUSP: 500000.0000 [IU] | Freq: Four times a day (QID) | OROMUCOSAL | Status: DC

## 2012-02-06 NOTE — Assessment & Plan Note (Signed)
Will treat with nystatin

## 2012-02-06 NOTE — Progress Notes (Signed)
  Subjective:    Patient ID: Kyle Blair, male    DOB: April 01, 1961, 51 y.o.   MRN: [id number]  HPI    Review of Systems     Objective:   Physical Exam  HENT:       Oral thrush noted on exam.           Assessment & Plan:

## 2012-02-06 NOTE — Progress Notes (Signed)
Addended by: [name] on: 02/06/2012 04:59 PM   Modules accepted: Orders

## 2012-02-06 NOTE — Telephone Encounter (Signed)
Spoke with pt's wife, she states pt is still short of breath and weak. Not much improvement since Friday and sometimes feels worse. Scheduled pt f/u for tomorrow at 1:30pm.

## 2012-02-07 ENCOUNTER — Ambulatory Visit (INDEPENDENT_AMBULATORY_CARE_PROVIDER_SITE_OTHER): Payer: Medicare Other | Admitting: Family

## 2012-02-07 VITALS — BP 124/86 | HR 83 | Temp 97.7°F | Resp 18 | Wt 230.0 lb

## 2012-02-07 DIAGNOSIS — B370 Candidal stomatitis: Secondary | ICD-10-CM

## 2012-02-07 DIAGNOSIS — R0602 Shortness of breath: Secondary | ICD-10-CM

## 2012-02-07 MED ORDER — DEXLANSOPRAZOLE 60 MG PO CPDR: 60.0000 mg | DELAYED_RELEASE_CAPSULE | Freq: Every day | ORAL | Status: DC

## 2012-02-07 NOTE — Progress Notes (Signed)
Subjective:    Patient ID: Kyle Blair, male    DOB: 01-Oct-1961, 51 y.o.   MRN: [id number]  HPI  Mr.  Tagle is a 51 yr old male who presents today for follow up of his Asthma exacerbation.  Reports that he continues to wheeze "like a set of bag pipes." Taking 4-5 albuterol treatments a day and is trying to keep activity level very low.  Denies swelling.  Reports that he always sleeps on 3 pillows- this is not new.  He has been on the Advair 500mg  this week, completed prednisone,  Z-pak completed.  S/p Solumedrol injection.  No improvement. Shortness of breath is worse with activity.  He reports hx of gerd symptoms, but notes that he has been taking prevacid "religiously."  Oral Thrush- started nystatin suspension- Denies sore throat.   GERD- reports hx of esophageal stricture/dilation.  Has seen Dr. Reader.  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   Review of Systems    see HPI  Past Medical History  Diagnosis Date  . Asthma   . Polycystic kidney disease   . Hypothyroid   . GERD with stricture   . BPH (benign prostatic hypertrophy)   . Hyperlipidemia   . Lung nodule 02/03/2012    History   Social History  . Marital Status: Married    Spouse Name: N/A    Number of Children: 2  . Years of Education: N/A   Occupational History  . DISABLED    Social History Main Topics  . Smoking status: Former Games developer  . Smokeless tobacco: Not on file  . Alcohol Use: Yes     rarely  . Drug Use: No  . Sexually Active: Not on file   Other Topics Concern  . Not on file   Social History Narrative   Caffeine use:  1 pot coffee daily, 1 glass teaRegular exercise:  No. 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      Past Surgical History  Procedure Date  . Hernia repair 2005    with mesh  . Nasal polyp excision 1992 or 93    Family History  Problem Relation Age of Onset  . Heart  disease Mother     pacemaker  . Diabetes Father   . Kidney disease Father   . Diabetes Sister     Allergies  Allergen Reactions  . Aspirin Anaphylaxis  . Ibuprofen Anaphylaxis  . Peanut Butter Flavor Anaphylaxis  . Keflex Rash    Current Outpatient Prescriptions on File Prior to Visit  Medication Sig Dispense Refill  . albuterol (PROVENTIL HFA;VENTOLIN HFA) 108 (90 BASE) MCG/ACT inhaler Inhale 2 puffs into the lungs every 6 (six) hours as needed. For shortness of breath and wheezing      . albuterol (PROVENTIL) (2.5 MG/3ML) 0.083% nebulizer solution Take 2.5 mg by nebulization every 6 (six) hours as needed. For shortness of breath and wheezing      . lansoprazole (PREVACID) 30 MG capsule Take 30 mg by mouth daily.        Marland Kitchen levothyroxine (SYNTHROID, LEVOTHROID) 175 MCG tablet Take 175 mcg by mouth daily.        Marland Kitchen lovastatin (MEVACOR) 40 MG tablet Take 40 mg by mouth at bedtime.        . montelukast (SINGULAIR) 10 MG tablet Take 10 mg by mouth at bedtime.        Marland Kitchen nystatin (MYCOSTATIN)  100000 UNIT/ML suspension Take 5 mLs (500,000 Units total) by mouth 4 (four) times daily.  180 mL  0  . Tamsulosin HCl (FLOMAX) 0.4 MG CAPS Take 0.4 mg by mouth at bedtime.        . Fluticasone-Salmeterol (ADVAIR) 250-50 MCG/DOSE AEPB Inhale 1 puff into the lungs every 12 (twelve) hours.        . predniSONE (DELTASONE) 10 MG tablet Take 1 tablet (10 mg total) by mouth daily.  10 tablet  0    BP 124/86  Pulse 83  Temp(Src) 97.7 F (36.5 C) (Oral)  Resp 18  Wt 230 lb (104.327 kg)  SpO2 94%    Objective:   Physical Exam  Constitutional: He is oriented to person, place, and time. He appears well-developed and well-nourished. No distress.  HENT:  Head: Normocephalic and atraumatic.  Eyes: Conjunctivae are normal.  Cardiovascular: Normal rate and regular rhythm.   No murmur heard. Pulmonary/Chest: Effort normal. No respiratory distress.       Upper airway, Pseudo wheeze noted.      Musculoskeletal: He exhibits no edema.  Neurological: He is alert and oriented to person, place, and time.  Skin: Skin is warm and dry. No erythema.  Psychiatric: He has a normal mood and affect. His behavior is normal. Judgment and thought content normal.          Assessment & Plan:

## 2012-02-07 NOTE — Patient Instructions (Signed)
You will be contacted about your referral to Pulmonology. You will be contacted about your referral for your echocardiogram to check your heart. Go to the ER if you develop worsening shortness of breath or chest pain.  Follow up in 1 month.

## 2012-02-08 NOTE — Assessment & Plan Note (Signed)
Improving.  Continue nystatin.

## 2012-02-08 NOTE — Assessment & Plan Note (Addendum)
He has had a neg CTA chest.  Will plan to refer for 2-D echo- try to obtain copy of his stress test performed at [facility].  Refer to Pulmonary for further evaluation. Change prevacid to dexilant for possible vocal cord dysfunction.  EKG and PFT's performed in the office and both have been personally reviewed and are normal.

## 2012-02-09 ENCOUNTER — Telehealth: Payer: Self-pay | Admitting: Family

## 2012-02-09 NOTE — Telephone Encounter (Signed)
Patient said the pharmacy didn't know how to instruct patient on how to take the nystatin mouthwash, please contact patient, he doesn't know if he is supposed to gargle or swallow it

## 2012-02-09 NOTE — Telephone Encounter (Signed)
Should  One teaspoon swish and swallow qid please.

## 2012-02-10 NOTE — Telephone Encounter (Signed)
Call placed to [facility] , spoke with pharmacist, he states patient has picked up Rx the issue was resolved.

## 2012-02-14 ENCOUNTER — Ambulatory Visit (HOSPITAL_COMMUNITY): Payer: Medicare Other | Attending: Cardiology

## 2012-02-14 ENCOUNTER — Telehealth: Payer: Self-pay | Admitting: *Deleted

## 2012-02-14 ENCOUNTER — Other Ambulatory Visit: Payer: Self-pay

## 2012-02-14 DIAGNOSIS — R0609 Other forms of dyspnea: Secondary | ICD-10-CM | POA: Insufficient documentation

## 2012-02-14 DIAGNOSIS — J45909 Unspecified asthma, uncomplicated: Secondary | ICD-10-CM | POA: Insufficient documentation

## 2012-02-14 DIAGNOSIS — R0602 Shortness of breath: Secondary | ICD-10-CM

## 2012-02-14 DIAGNOSIS — E785 Hyperlipidemia, unspecified: Secondary | ICD-10-CM | POA: Insufficient documentation

## 2012-02-14 DIAGNOSIS — Q613 Polycystic kidney, unspecified: Secondary | ICD-10-CM | POA: Insufficient documentation

## 2012-02-14 DIAGNOSIS — I10 Essential (primary) hypertension: Secondary | ICD-10-CM | POA: Insufficient documentation

## 2012-02-14 DIAGNOSIS — I059 Rheumatic mitral valve disease, unspecified: Secondary | ICD-10-CM | POA: Insufficient documentation

## 2012-02-14 DIAGNOSIS — R0989 Other specified symptoms and signs involving the circulatory and respiratory systems: Secondary | ICD-10-CM | POA: Insufficient documentation

## 2012-02-14 DIAGNOSIS — I079 Rheumatic tricuspid valve disease, unspecified: Secondary | ICD-10-CM | POA: Insufficient documentation

## 2012-02-14 DIAGNOSIS — E669 Obesity, unspecified: Secondary | ICD-10-CM | POA: Insufficient documentation

## 2012-02-14 NOTE — Telephone Encounter (Signed)
He should keep his upcoming appointment with Dr. Delford Field on Thursday. I will await the echo results and let him know how they look.

## 2012-02-14 NOTE — Telephone Encounter (Signed)
Notified pt and advised him to let us know if his BP reading is still elevated on Thursday as his previous BP readings with [place] have been normal. Pt voices understanding.

## 2012-02-14 NOTE — Telephone Encounter (Signed)
Received call from pt that he completed his Echocardiogram today. BP at that visit was 164/101per pt and he reports that his shortness of breath is still the same. Please advise.

## 2012-02-15 ENCOUNTER — Telehealth: Payer: Self-pay | Admitting: Family

## 2012-02-15 NOTE — Telephone Encounter (Signed)
Reviewed 2-D echo results.  Recommended that pt pt keep apt tomorrow with pulmonary. Let me know if symptoms worsen or if not improvement in 1 week. He verbalizes understanding.

## 2012-02-16 ENCOUNTER — Encounter: Payer: Self-pay | Admitting: Critical Care Medicine

## 2012-02-16 ENCOUNTER — Other Ambulatory Visit: Payer: Self-pay | Admitting: Critical Care Medicine

## 2012-02-16 ENCOUNTER — Ambulatory Visit (INDEPENDENT_AMBULATORY_CARE_PROVIDER_SITE_OTHER): Payer: Medicare Other | Admitting: Critical Care Medicine

## 2012-02-16 DIAGNOSIS — J45909 Unspecified asthma, uncomplicated: Secondary | ICD-10-CM

## 2012-02-16 DIAGNOSIS — R911 Solitary pulmonary nodule: Secondary | ICD-10-CM

## 2012-02-16 DIAGNOSIS — J45902 Unspecified asthma with status asthmaticus: Secondary | ICD-10-CM

## 2012-02-16 MED ORDER — FLUTICASONE PROPIONATE 50 MCG/ACT NA SUSP: 2.0000 | Freq: Every day | NASAL | Status: DC

## 2012-02-16 MED ORDER — AZITHROMYCIN 250 MG PO TABS: 250.0000 mg | ORAL_TABLET | Freq: Every day | ORAL | Status: AC

## 2012-02-16 MED ORDER — PREDNISONE 10 MG PO TABS: ORAL_TABLET | ORAL | Status: DC

## 2012-02-16 MED ORDER — MOMETASONE FURO-FORMOTEROL FUM 200-5 MCG/ACT IN AERO: 2.0000 | INHALATION_SPRAY | Freq: Two times a day (BID) | RESPIRATORY_TRACT | Status: DC

## 2012-02-16 NOTE — Assessment & Plan Note (Signed)
Severe persistent asthma with flare , nasal polyps, chronic sinusitis, GERD and atopy as ppt factors Plan  Stay on Dexilant Azithromycin x 5days Pulse prednisone 10mg  Take 4 for four days 3 for four days 2 for four days 1 for four days D/c Advair Start Dulera 200 two puff bid flonase nasal spray

## 2012-02-16 NOTE — Progress Notes (Signed)
Subjective:    Patient ID: Kyle Blair, male    DOB: 10/13/1961, 51 y.o.   MRN: [id number]  HPI Comments: Dx Asthma  Lifelong.  Getting worse .  Has been to ED recently,  Two weeks ago. Rx neb med. Gets worse then pred or depomedrol helps   Asthma He complains of chest tightness, cough, difficulty breathing, hoarse voice, shortness of breath, sputum production and wheezing. There is no frequent throat clearing or hemoptysis. Primary symptoms comments: Dyspneic with exertion, having to use albuterol 4-6 rx per day Has sore throat with thrush with advair Notes heartburn daily. This is a chronic problem. The current episode started more than 1 year ago. The problem occurs 2 to 4 times per day. The problem has been gradually worsening. The cough is productive of purulent sputum, productive and dry. Associated symptoms include chest pain, dyspnea on exertion, heartburn, nasal congestion, postnasal drip, rhinorrhea, sneezing, a sore throat and trouble swallowing. Pertinent negatives include no appetite change, ear congestion, ear pain, fever, headaches, myalgias or PND. Associated symptoms comments: Hx of nasal polyps. His symptoms are aggravated by change in weather, any activity, strenuous activity, URI, exposure to smoke and exposure to fumes. His symptoms are alleviated by oral steroids and beta-agonist. He reports moderate improvement on treatment. Risk factors for lung disease include smoking/tobacco exposure. His past medical history is significant for asthma and pneumonia. There is no history of bronchiectasis, bronchitis, COPD or emphysema. Past medical history comments: Pna in the past , last two years ago.    PUL ASTHMA HISTORY 02/16/2012  Symptoms Throughout the day  Nighttime awakenings >1/wk but not nightly  Interference with activity Extreme limitations  SABA use Several times/day  Exacerbations requiring oral steroids 2 or more / year   Past Medical History  Diagnosis Date  .  Asthma   . Polycystic kidney disease   . Hypothyroid   . GERD with stricture   . BPH (benign prostatic hypertrophy)   . Hyperlipidemia   . Lung nodule 02/03/2012  . History of stroke 2004     Family History  Problem Relation Age of Onset  . Heart disease Mother     pacemaker  . Diabetes Father   . Kidney disease Father   . Diabetes Sister      History   Social History  . Marital Status: Married    Spouse Name: N/A    Number of Children: 2  . Years of Education: N/A   Occupational History  . DISABLED    Social History Main Topics  . Smoking status: Former Smoker -- 1.0 packs/day for 20 years    Types: Cigarettes    Quit date: 12/26/2004  . Smokeless tobacco: Never Used  . Alcohol Use: Yes     rarely  . Drug Use: No  . Sexually Active: Not on file   Other Topics Concern  . Not on file   Social History Narrative   Caffeine use:  1 pot coffee daily, 1 glass teaRegular exercise:  No. 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       Allergies  Allergen Reactions  . Aspirin Anaphylaxis  . Ibuprofen Anaphylaxis  . Peanut Butter Flavor Anaphylaxis  . Keflex Rash     Outpatient Prescriptions Prior to Visit  Medication Sig Dispense Refill  . albuterol (PROVENTIL HFA;VENTOLIN HFA) 108 (90 BASE) MCG/ACT inhaler Inhale 2 puffs into the lungs  every 6 (six) hours as needed. For shortness of breath and wheezing      . albuterol (PROVENTIL) (2.5 MG/3ML) 0.083% nebulizer solution Take 2.5 mg by nebulization every 6 (six) hours as needed. For shortness of breath and wheezing      . dexlansoprazole (DEXILANT) 60 MG capsule Take 1 capsule (60 mg total) by mouth daily.  30 capsule  0  . levothyroxine (SYNTHROID, LEVOTHROID) 175 MCG tablet Take 175 mcg by mouth daily.        Marland Kitchen lovastatin (MEVACOR) 40 MG tablet Take 40 mg by mouth at bedtime.        . montelukast (SINGULAIR) 10 MG  tablet Take 10 mg by mouth at bedtime.        . Tamsulosin HCl (FLOMAX) 0.4 MG CAPS Take 0.4 mg by mouth at bedtime.        . Fluticasone-Salmeterol (ADVAIR) 250-50 MCG/DOSE AEPB Inhale 1 puff into the lungs every 12 (twelve) hours.        . lansoprazole (PREVACID) 30 MG capsule Take 30 mg by mouth daily.        Marland Kitchen nystatin (MYCOSTATIN) 100000 UNIT/ML suspension Take 5 mLs (500,000 Units total) by mouth 4 (four) times daily.  180 mL  0  . predniSONE (DELTASONE) 10 MG tablet Take 1 tablet (10 mg total) by mouth daily.  10 tablet  0      Review of Systems  Constitutional: Positive for diaphoresis, activity change, fatigue and unexpected weight change. Negative for fever, chills and appetite change.  HENT: Positive for congestion, sore throat, hoarse voice, rhinorrhea, sneezing, trouble swallowing and postnasal drip. Negative for hearing loss, ear pain, nosebleeds, facial swelling, mouth sores, neck pain, neck stiffness, dental problem, voice change, sinus pressure, tinnitus and ear discharge.   Eyes: Negative for photophobia, discharge, itching and visual disturbance.  Respiratory: Positive for cough, sputum production, chest tightness, shortness of breath and wheezing. Negative for apnea, hemoptysis, choking and stridor.   Cardiovascular: Positive for chest pain, dyspnea on exertion and palpitations. Negative for leg swelling and PND.  Gastrointestinal: Positive for heartburn and abdominal distention. Negative for nausea, vomiting, abdominal pain, constipation and blood in stool.  Genitourinary: Positive for difficulty urinating. Negative for dysuria, urgency, frequency, hematuria, flank pain and decreased urine volume.  Musculoskeletal: Positive for gait problem. Negative for myalgias, back pain, joint swelling and arthralgias.  Skin: Positive for pallor. Negative for color change and rash.  Neurological: Positive for dizziness, light-headedness and numbness. Negative for tremors, seizures,  syncope, speech difficulty, weakness and headaches.  Hematological: Negative for adenopathy. Does not bruise/bleed easily.  Psychiatric/Behavioral: Positive for confusion, sleep disturbance and agitation. The patient is nervous/anxious.        Objective:   Physical Exam  Filed Vitals:   02/16/12 1422  BP: 128/80  Pulse: 81  Temp: 98.1 F (36.7 C)  TempSrc: Oral  Height: 5\' 6"  (1.676 m)  Weight: 234 lb (106.142 kg)  SpO2: 96%    Gen: obese WM , in no distress,  normal affect  ENT: bilat nasal polyps, severe inflammation R naris, R>L nasal purulence,  mouth clear,  oropharynx clear, +++  postnasal drip  Neck: No JVD, no TMG, no carotid bruits  Lungs: No use of accessory muscles, no dullness to percussion, exp wheeze, poor airflow  Cardiovascular: RRR, heart sounds normal, no murmur or gallops, no peripheral edema  Abdomen: soft and NT, no HSM,  BS normal  Musculoskeletal: No deformities, no cyanosis or clubbing  Neuro: alert,  non focal  Skin: Warm, no lesions or rashes  CT Chest 02/03/12 Findings:  No enlarged axillary or supraclavicular lymph nodes.  There is no mediastinal or hilar lymph nodes identified.  No pericardial or pleural effusions identified.  No abnormal filling defects identified within the main pulmonary  artery or its branches to suggest acute pulmonary embolus.  No airspace consolidation identified.  There is a 7.4 mm nodule within the superior segment of the left  lower lobe, image 32.  Review of the visualized osseous structures is unremarkable.  No worrisome bone abnormalities noted.  Limited imaging through the upper abdomen demonstrates multiple low  density structures within the liver parenchyma.  IMPRESSION:  1. Negative for acute pulmonary embolus  2. Pulmonary nodule in the left lung measures 7.4 mm. If the  patient is at high risk for bronchogenic carcinoma, follow-up chest  CT at 3-6 months is recommended.   02/07/12: Cleda Daub: Mild  peripheral airway obstruction      Assessment & Plan:   Extrinsic asthma with status asthmaticus Severe persistent asthma with flare , nasal polyps, chronic sinusitis, GERD and atopy as ppt factors Plan  Stay on Dexilant Azithromycin x 5days Pulse prednisone 10mg  Take 4 for four days 3 for four days 2 for four days 1 for four days D/c Advair Start Dulera 200 two puff bid flonase nasal spray   Lung nodule LLL nodule likely benign F/u CT scan scheduled 8/13, ok.      Updated Medication List Outpatient Encounter Prescriptions as of 02/16/2012  Medication Sig Dispense Refill  . albuterol (PROVENTIL HFA;VENTOLIN HFA) 108 (90 BASE) MCG/ACT inhaler Inhale 2 puffs into the lungs every 6 (six) hours as needed. For shortness of breath and wheezing      . albuterol (PROVENTIL) (2.5 MG/3ML) 0.083% nebulizer solution Take 2.5 mg by nebulization every 6 (six) hours as needed. For shortness of breath and wheezing      . dexlansoprazole (DEXILANT) 60 MG capsule Take 1 capsule (60 mg total) by mouth daily.  30 capsule  0  . levothyroxine (SYNTHROID, LEVOTHROID) 175 MCG tablet Take 175 mcg by mouth daily.        Marland Kitchen lovastatin (MEVACOR) 40 MG tablet Take 40 mg by mouth at bedtime.        . montelukast (SINGULAIR) 10 MG tablet Take 10 mg by mouth at bedtime.        . Tamsulosin HCl (FLOMAX) 0.4 MG CAPS Take 0.4 mg by mouth at bedtime.        Marland Kitchen DISCONTD: Fluticasone-Salmeterol (ADVAIR) 250-50 MCG/DOSE AEPB Inhale 1 puff into the lungs every 12 (twelve) hours.        Marland Kitchen azithromycin (ZITHROMAX) 250 MG tablet Take 1 tablet (250 mg total) by mouth daily. Take two once then one daily until gone  6 each  0  . fluticasone (FLONASE) 50 MCG/ACT nasal spray Place 2 sprays into the nose daily.  16 g  6  . Mometasone Furo-Formoterol Fum (DULERA) 200-5 MCG/ACT AERO Inhale 2 puffs into the lungs 2 (two) times daily.  1 Inhaler  6  . predniSONE (DELTASONE) 10 MG tablet Take 4 for four days 3 for four days 2 for  four days 1 for four days  40 tablet  0  . DISCONTD: lansoprazole (PREVACID) 30 MG capsule Take 30 mg by mouth daily.        Marland Kitchen DISCONTD: nystatin (MYCOSTATIN) 100000 UNIT/ML suspension Take 5 mLs (500,000 Units total) by mouth 4 (four) times daily.  180 mL  0  . DISCONTD: predniSONE (DELTASONE) 10 MG tablet Take 1 tablet (10 mg total) by mouth daily.  10 tablet  0

## 2012-02-16 NOTE — Patient Instructions (Signed)
Follow reflux diet Start Dulera two puff twice daily Stop advair Prednisone 10mg  Take 4 for four days 3 for four days 2 for four days 1 for four days Azithromycin 250mg  Take two once then one daily until gone Start flonase two puff each nostril daily Stay on Dexilant Labs today We will follow the nodule in the lung and repeat CT Chest in 6 months Return 6 weeks

## 2012-02-16 NOTE — Assessment & Plan Note (Signed)
LLL nodule likely benign F/u CT scan scheduled 8/13, ok.

## 2012-02-17 LAB — ALLERGY FULL PROFILE
Alternaria Alternata: <0.1 kU/L (ref <0.35)
Bahia Grass: <0.1 kU/L (ref <0.35)
Bermuda Grass: <0.1 kU/L (ref <0.35)
Box Elder IgE: <0.1 kU/L (ref <0.35)
Candida Albicans: <0.1 kU/L (ref <0.35)
Cat Dander: <0.1 kU/L (ref <0.35)
Curvularia lunata: <0.1 kU/L (ref <0.35)
Dog Dander: <0.1 kU/L (ref <0.35)
Fescue: <0.1 kU/L (ref <0.35)
G005 Rye, Perennial: <0.1 kU/L (ref <0.35)
G009 Red Top: <0.1 kU/L (ref <0.35)
Goldenrod: <0.1 kU/L (ref <0.35)
Helminthosporium halodes: <0.1 kU/L (ref <0.35)
Lamb's Quarters: <0.1 kU/L (ref <0.35)
Sycamore Tree: <0.1 kU/L (ref <0.35)

## 2012-02-17 NOTE — Progress Notes (Signed)
Quick Note:  Call pt and tell him allergy labs normal. No severe allergies at this time ______

## 2012-02-20 NOTE — Progress Notes (Signed)
Quick Note:  Called, spoke with pt. I informed him allergy labs are normal; no sever allergies at this time per Dr. Delford Field. He verbalized understanding of this and voiced no further questions/concerns at this time. ______

## 2012-03-06 ENCOUNTER — Encounter: Payer: Self-pay | Admitting: Family

## 2012-03-06 ENCOUNTER — Ambulatory Visit (INDEPENDENT_AMBULATORY_CARE_PROVIDER_SITE_OTHER): Payer: Medicare Other | Admitting: Family

## 2012-03-06 DIAGNOSIS — J45909 Unspecified asthma, uncomplicated: Secondary | ICD-10-CM

## 2012-03-06 DIAGNOSIS — J339 Nasal polyp, unspecified: Secondary | ICD-10-CM

## 2012-03-06 DIAGNOSIS — B370 Candidal stomatitis: Secondary | ICD-10-CM

## 2012-03-06 DIAGNOSIS — Q613 Polycystic kidney, unspecified: Secondary | ICD-10-CM

## 2012-03-06 DIAGNOSIS — J45902 Unspecified asthma with status asthmaticus: Secondary | ICD-10-CM

## 2012-03-06 MED ORDER — MOMETASONE FURO-FORMOTEROL FUM 200-5 MCG/ACT IN AERO: 2.0000 | INHALATION_SPRAY | Freq: Two times a day (BID) | RESPIRATORY_TRACT | Status: DC

## 2012-03-06 MED ORDER — OMEPRAZOLE 40 MG PO CPDR: 40.0000 mg | DELAYED_RELEASE_CAPSULE | Freq: Every day | ORAL | Status: DC

## 2012-03-06 NOTE — Progress Notes (Signed)
Subjective:    Patient ID: Kyle Blair, male    DOB: Oct 22, 1961, 51 y.o.   MRN: [id number]  HPI  Mr. Groninger is a 51 yr old male who presents today for follow up.  Asthma- saw Dr. Delford Field, was treated with z-pak an prednisone. Was told that he has nasal polyps.  Wants referral to ENT. Reports that he continues to wheeze, but improving. Has not using albuterol neb x 1 week.  Advair has been switched to [facility].  He has not yet used flonase.   BPH- reports that the flomax is improving his symptoms.  Review of Systems See HPI  Past Medical History  Diagnosis Date  . Asthma   . Polycystic kidney disease   . Hypothyroid   . GERD with stricture   . BPH (benign prostatic hypertrophy)   . Hyperlipidemia   . Lung nodule 02/03/2012  . History of stroke 2004    History   Social History  . Marital Status: Married    Spouse Name: N/A    Number of Children: 2  . Years of Education: N/A   Occupational History  . DISABLED    Social History Main Topics  . Smoking status: Former Smoker -- 1.0 packs/day for 20 years    Types: Cigarettes    Quit date: 12/26/2004  . Smokeless tobacco: Never Used  . Alcohol Use: Yes     rarely  . Drug Use: No  . Sexually Active: Not on file   Other Topics Concern  . Not on file   Social History Narrative   Caffeine use:  1 pot coffee daily, 1 glass teaRegular exercise:  No. 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      Past Surgical History  Procedure Date  . Hernia repair 2005    with mesh  . Nasal polyp excision 1992 or 93    Family History  Problem Relation Age of Onset  . Heart disease Mother     pacemaker  . Diabetes Father   . Kidney disease Father   . Diabetes Sister     Allergies  Allergen Reactions  . Aspirin Anaphylaxis  . Ibuprofen Anaphylaxis  . Peanut Butter Flavor Anaphylaxis  . Keflex Rash    Current Outpatient  Prescriptions on File Prior to Visit  Medication Sig Dispense Refill  . albuterol (PROVENTIL HFA;VENTOLIN HFA) 108 (90 BASE) MCG/ACT inhaler Inhale 2 puffs into the lungs every 6 (six) hours as needed. For shortness of breath and wheezing      . dexlansoprazole (DEXILANT) 60 MG capsule Take 1 capsule (60 mg total) by mouth daily.  30 capsule  0  . levothyroxine (SYNTHROID, LEVOTHROID) 175 MCG tablet Take 175 mcg by mouth daily.        Marland Kitchen lovastatin (MEVACOR) 40 MG tablet Take 40 mg by mouth at bedtime.        . montelukast (SINGULAIR) 10 MG tablet Take 10 mg by mouth at bedtime.        . Tamsulosin HCl (FLOMAX) 0.4 MG CAPS Take 0.4 mg by mouth at bedtime.        Marland Kitchen albuterol (PROVENTIL) (2.5 MG/3ML) 0.083% nebulizer solution Take 2.5 mg by nebulization every 6 (six) hours as needed. For shortness of breath and wheezing      . fluticasone (FLONASE) 50 MCG/ACT nasal spray Place 2 sprays into the nose daily.  16 g  6  BP 148/86  Pulse 82  Temp(Src) 98 F (36.7 C) (Oral)  Resp 16  Ht 5' 5.98" (1.676 m)  Wt 232 lb 1.3 oz (105.271 kg)  BMI 37.48 kg/m2  SpO2 96%       Objective:   Physical Exam  Constitutional: He is oriented to person, place, and time. He appears well-developed and well-nourished. No distress.  HENT:  Head: Normocephalic and atraumatic.  Right Ear: Tympanic membrane and ear canal normal.  Left Ear: Tympanic membrane and ear canal normal.       No visible polyps in nose.  Cardiovascular: Normal rate and regular rhythm.   No murmur heard. Pulmonary/Chest: Effort normal and breath sounds normal. No respiratory distress. He has no wheezes. He has no rales. He exhibits no tenderness.  Neurological: He is alert and oriented to person, place, and time.  Skin: Skin is warm and dry.  Psychiatric: He has a normal mood and affect. His behavior is normal. Judgment and thought content normal.          Assessment & Plan:

## 2012-03-06 NOTE — Assessment & Plan Note (Signed)
Improved.  Continue dulera and prn albuterol.  Refer to ENT due to hx of nasal polyps.

## 2012-03-06 NOTE — Assessment & Plan Note (Signed)
He has upcoming appointment with Nephrology.

## 2012-03-06 NOTE — Assessment & Plan Note (Signed)
Resolved

## 2012-03-06 NOTE — Patient Instructions (Signed)
Please follow up in 3 months. Please call sooner if needed.   You will be contacted about your referral to ENT.

## 2012-03-29 ENCOUNTER — Ambulatory Visit: Payer: Medicare Other | Admitting: Critical Care Medicine

## 2012-04-26 ENCOUNTER — Telehealth: Payer: Self-pay | Admitting: *Deleted

## 2012-04-26 DIAGNOSIS — R911 Solitary pulmonary nodule: Secondary | ICD-10-CM

## 2012-04-26 NOTE — Telephone Encounter (Signed)
Received call from pt's wife stating it is time to repeat pt's chest ct to follow up on his lung nodule.  Please advise.

## 2012-04-30 ENCOUNTER — Encounter: Payer: Self-pay | Admitting: Family

## 2012-04-30 ENCOUNTER — Ambulatory Visit (INDEPENDENT_AMBULATORY_CARE_PROVIDER_SITE_OTHER): Payer: Medicare Other | Admitting: Family

## 2012-04-30 VITALS — BP 104/70 | HR 84 | Temp 97.7°F | Resp 16 | Wt 234.0 lb

## 2012-04-30 DIAGNOSIS — Q613 Polycystic kidney, unspecified: Secondary | ICD-10-CM

## 2012-04-30 DIAGNOSIS — M25561 Pain in right knee: Secondary | ICD-10-CM

## 2012-04-30 DIAGNOSIS — M25569 Pain in unspecified knee: Secondary | ICD-10-CM

## 2012-04-30 LAB — BASIC METABOLIC PANEL
Glucose, Bld: 111 mg/dL — ABNORMAL HIGH (ref 70–99)
Potassium: 4.2 mEq/L (ref 3.5–5.3)
Sodium: 139 mEq/L (ref 135–145)

## 2012-04-30 MED ORDER — TRAMADOL HCL 50 MG PO TABS: 50.0000 mg | ORAL_TABLET | Freq: Three times a day (TID) | ORAL | Status: AC | PRN

## 2012-04-30 NOTE — Progress Notes (Signed)
Subjective:    Patient ID: Kyle Blair, male    DOB: [birth year], 51 y.o.   MRN: [id number]  HPI  [name] is a 51 yr old male who presents today with chief complaint of right knee pain.  Pain started 5/4 after he completed some yard work.  That night, the knee became very stiff.  Since that time he has developed some mild swelling of the right knee.  Knee is more painful with walking.   Review of Systems See HPI  Past Medical History  Diagnosis Date  . Asthma   . Polycystic kidney disease   . Hypothyroid   . GERD with stricture   . BPH (benign prostatic hypertrophy)   . Hyperlipidemia   . Lung nodule 02/03/2012  . History of stroke 2004    History   Social History  . Marital Status: Married    Spouse Name: N/A    Number of Children: 2  . Years of Education: N/A   Occupational History  . DISABLED    Social History Main Topics  . Smoking status: Former Smoker -- 1.0 packs/day for 20 years    Types: Cigarettes    Quit date: 12/26/2004  . Smokeless tobacco: Never Used  . Alcohol Use: Yes     rarely  . Drug Use: No  . Sexually Active: Not on file   Other Topics Concern  . Not on file   Social History Narrative   Caffeine use:  1 pot coffee daily, 1 glass teaRegular exercise:  No. 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      Past Surgical History  Procedure Date  . Hernia repair 2005    with mesh  . Nasal polyp excision 1992 or 93    Family History  Problem Relation Age of Onset  . Heart disease Mother     pacemaker  . Diabetes Father   . Kidney disease Father   . Diabetes Sister     Allergies  Allergen Reactions  . Aspirin Anaphylaxis  . Ibuprofen Anaphylaxis  . Peanut Butter Flavor Anaphylaxis  . Cephalexin Rash    Current Outpatient Prescriptions on File Prior to Visit  Medication Sig Dispense Refill  . albuterol (PROVENTIL  HFA;VENTOLIN HFA) 108 (90 BASE) MCG/ACT inhaler Inhale 2 puffs into the lungs every 6 (six) hours as needed. For shortness of breath and wheezing      . albuterol (PROVENTIL) (2.5 MG/3ML) 0.083% nebulizer solution Take 2.5 mg by nebulization every 6 (six) hours as needed. For shortness of breath and wheezing      . dexlansoprazole (DEXILANT) 60 MG capsule Take 60 mg by mouth daily.      . fluticasone (FLONASE) 50 MCG/ACT nasal spray Place 2 sprays into the nose daily.  16 g  6  . levothyroxine (SYNTHROID, LEVOTHROID) 175 MCG tablet Take 175 mcg by mouth daily.        Marland Kitchen lovastatin (MEVACOR) 40 MG tablet Take 40 mg by mouth at bedtime.        . Mometasone Furo-Formoterol Fum (DULERA) 200-5 MCG/ACT AERO Inhale 2 puffs into the lungs 2 (two) times daily.  1 Inhaler  6  . montelukast (SINGULAIR) 10 MG tablet Take 10 mg by mouth at bedtime.        Marland Kitchen omeprazole (PRILOSEC) 40 MG capsule Take 1 capsule (40 mg total) by mouth daily.  30 capsule  3  .  Tamsulosin HCl (FLOMAX) 0.4 MG CAPS Take 0.4 mg by mouth at bedtime.          BP 104/70  Pulse 84  Temp(Src) 97.7 F (36.5 C) (Oral)  Resp 16  Wt 234 lb (106.142 kg)  SpO2 95%       Objective:   Physical Exam  Constitutional: He appears well-developed and well-nourished. No distress.  Cardiovascular: Normal rate and regular rhythm.   No murmur heard. Pulmonary/Chest: Effort normal and breath sounds normal. No respiratory distress. He has no wheezes. He has no rales. He exhibits no tenderness.  Musculoskeletal:       Mild edema around the right knee cap.  No tenderness to palpation, no erythema or warmth.            Assessment & Plan:

## 2012-04-30 NOTE — Telephone Encounter (Signed)
Pt notified at appt today

## 2012-04-30 NOTE — Telephone Encounter (Signed)
I have ordered CT.  Myriam Jacobson will call him with the scheduling.

## 2012-04-30 NOTE — Patient Instructions (Signed)

## 2012-05-01 ENCOUNTER — Encounter: Payer: Self-pay | Admitting: Family

## 2012-05-02 DIAGNOSIS — M25561 Pain in right knee: Secondary | ICD-10-CM | POA: Insufficient documentation

## 2012-05-02 NOTE — Assessment & Plan Note (Signed)
Pt is unable to take NSAIDS.  I recommended ice/ace wrap to knee.  If symptoms worsen or if no improvement, consider referral to sports medicine for further evaluation.

## 2012-05-07 ENCOUNTER — Other Ambulatory Visit (HOSPITAL_BASED_OUTPATIENT_CLINIC_OR_DEPARTMENT_OTHER): Payer: Medicare Other

## 2012-05-08 ENCOUNTER — Other Ambulatory Visit (HOSPITAL_BASED_OUTPATIENT_CLINIC_OR_DEPARTMENT_OTHER): Payer: Medicare Other

## 2012-05-08 ENCOUNTER — Ambulatory Visit (HOSPITAL_BASED_OUTPATIENT_CLINIC_OR_DEPARTMENT_OTHER)
Admission: RE | Admit: 2012-05-08 | Discharge: 2012-05-08 | Disposition: A | Discharge status: Home / Self Care | Payer: Medicare Other | Source: Ambulatory Visit | Attending: Family | Admitting: Family

## 2012-05-08 DIAGNOSIS — J984 Other disorders of lung: Secondary | ICD-10-CM | POA: Insufficient documentation

## 2012-05-08 DIAGNOSIS — R911 Solitary pulmonary nodule: Secondary | ICD-10-CM

## 2012-05-08 DIAGNOSIS — Q619 Cystic kidney disease, unspecified: Secondary | ICD-10-CM

## 2012-05-08 DIAGNOSIS — Z0389 Encounter for observation for other suspected diseases and conditions ruled out: Secondary | ICD-10-CM

## 2012-05-08 MED ORDER — IOHEXOL 300 MG/ML  SOLN
80.0000 mL | Freq: Once | INTRAMUSCULAR | Status: AC | PRN
  Administered 2012-05-08: 80 mL via INTRAVENOUS

## 2012-05-14 ENCOUNTER — Other Ambulatory Visit (HOSPITAL_BASED_OUTPATIENT_CLINIC_OR_DEPARTMENT_OTHER): Payer: Medicare Other

## 2012-05-14 ENCOUNTER — Telehealth: Payer: Self-pay | Admitting: Family

## 2012-05-14 NOTE — Telephone Encounter (Signed)
Notified Chad at [employer] per instruction below.

## 2012-05-14 NOTE — Telephone Encounter (Signed)
OK to change to generic levothyroxine please.

## 2012-05-14 NOTE — Telephone Encounter (Signed)
DRUG CHANGE REQUEST  Levothyroxine 0.175mg ( ) tabs. Take one tablet by mouth daily. Qty 90 last fill 1.4.13  Pharmacy comments: patient normally gets brand levoxyl, but its on long term manuf back order. Can we change to brand name synthroid or generic levothyroxine. Please advise. Thanks.

## 2012-06-21 ENCOUNTER — Telehealth: Payer: Self-pay | Admitting: *Deleted

## 2012-06-21 NOTE — Telephone Encounter (Signed)
Patient called and left voice message stating he has blood work done at his urologist and the labs were faxed to our office. He is requesting the results from those labs

## 2012-06-25 NOTE — Telephone Encounter (Signed)
Notified pt's wife that we have not received recent lab results from nephrologist and recommended that he contact them for interpretation of labs that they have ordered. She voices understanding.

## 2012-07-02 ENCOUNTER — Telehealth: Payer: Self-pay | Admitting: *Deleted

## 2012-07-02 NOTE — Telephone Encounter (Signed)
Received form from pt for completion of handicap placard (parking permit). Form forwarded to Provider for signature.

## 2012-07-03 NOTE — Telephone Encounter (Signed)
Left detailed message on pt's home # that form is ready for pick up at the front desk and to call if any questions.

## 2012-09-26 ENCOUNTER — Ambulatory Visit (INDEPENDENT_AMBULATORY_CARE_PROVIDER_SITE_OTHER): Payer: Medicare Other | Admitting: Family

## 2012-09-26 ENCOUNTER — Encounter: Payer: Self-pay | Admitting: Family

## 2012-09-26 ENCOUNTER — Telehealth: Payer: Self-pay | Admitting: Family

## 2012-09-26 ENCOUNTER — Ambulatory Visit: Payer: Medicare Other | Admitting: Family

## 2012-09-26 VITALS — BP 114/78 | HR 75 | Temp 97.8°F | Resp 16 | Wt 223.1 lb

## 2012-09-26 DIAGNOSIS — E785 Hyperlipidemia, unspecified: Secondary | ICD-10-CM

## 2012-09-26 DIAGNOSIS — E039 Hypothyroidism, unspecified: Secondary | ICD-10-CM

## 2012-09-26 DIAGNOSIS — Q613 Polycystic kidney, unspecified: Secondary | ICD-10-CM

## 2012-09-26 DIAGNOSIS — Z8739 Personal history of other diseases of the musculoskeletal system and connective tissue: Secondary | ICD-10-CM

## 2012-09-26 DIAGNOSIS — M549 Dorsalgia, unspecified: Secondary | ICD-10-CM

## 2012-09-26 NOTE — Patient Instructions (Addendum)
You will be contact about your referral to Neurosurgery (Dr. Gerlene Fee) Please let us know if you have not heard back within 1 week about your referral. Please follow up tomorrow morning fasting for lab work.  Please schedule a follow up appointment in 3 months.

## 2012-09-26 NOTE — Assessment & Plan Note (Signed)
Continues mevacor.  He will return tomorrow for flp.

## 2012-09-26 NOTE — Assessment & Plan Note (Addendum)
Clinically stable on synthroid. Obtain tsh.  

## 2012-09-26 NOTE — Telephone Encounter (Addendum)
Pls call pt and let him know that I reviewed his current meds and think that he should remain on omeprazole for his reflux.

## 2012-09-26 NOTE — Assessment & Plan Note (Signed)
Refer to Dr. Gerlene Fee for follow up.

## 2012-09-26 NOTE — Assessment & Plan Note (Signed)
Kidney function is stable.  Management per renal.

## 2012-09-26 NOTE — Progress Notes (Signed)
Subjective:    Patient ID: Kyle Blair, male    DOB: 1961-10-08, 51 y.o.   MRN: [id number]  HPI  Kyle Blair is a 51 yr old male who presents today for follow up.  Hypothyroid- he continues levothyroxine. 10 pound weight loss with waling 1 hr a day.    Back Pain- has followed with Dr. Gerlene Fee.  Some scrotal "buring and electric type pain."   Hyperlipidemia- Pt is maintained on mevacor.  Polycystic Kidney Disease- Recently had appointment with Dr. Abel Presto.  Kidney function is stable. (labs reviewed from his apt)    Review of Systems see HPI    Past Medical History  Diagnosis Date  . Asthma   . Polycystic kidney disease   . Hypothyroid   . GERD with stricture   . BPH (benign prostatic hypertrophy)   . Hyperlipidemia   . Lung nodule 02/03/2012  . History of stroke 2004    History   Social History  . Marital Status: Married    Spouse Name: N/A    Number of Children: 2  . Years of Education: N/A   Occupational History  . DISABLED    Social History Main Topics  . Smoking status: Former Smoker -- 1.0 packs/day for 20 years    Types: Cigarettes    Quit date: 12/26/2004  . Smokeless tobacco: Never Used  . Alcohol Use: Yes     rarely  . Drug Use: No  . Sexually Active: Not on file   Other Topics Concern  . Not on file   Social History Narrative   Caffeine use:  1 pot coffee daily, 1 glass teaRegular exercise:  No. 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      Past Surgical History  Procedure Date  . Hernia repair 2005    with mesh  . Nasal polyp excision 1992 or 93    Family History  Problem Relation Age of Onset  . Heart disease Mother     pacemaker  . Diabetes Father   . Kidney disease Father   . Diabetes Sister     Allergies  Allergen Reactions  . Aspirin Anaphylaxis  . Ibuprofen Anaphylaxis  . Peanut Butter Flavor Anaphylaxis  .  Cephalexin Rash    Current Outpatient Prescriptions on File Prior to Visit  Medication Sig Dispense Refill  . ADVAIR DISKUS 250-50 MCG/DOSE AEPB Take 1 puff by mouth Twice daily.      Marland Kitchen albuterol (PROVENTIL HFA;VENTOLIN HFA) 108 (90 BASE) MCG/ACT inhaler Inhale 2 puffs into the lungs every 6 (six) hours as needed. For shortness of breath and wheezing      . albuterol (PROVENTIL) (2.5 MG/3ML) 0.083% nebulizer solution Take 2.5 mg by nebulization every 6 (six) hours as needed. For shortness of breath and wheezing      . amLODipine-benazepril (LOTREL) 5-10 MG per capsule Take 1 capsule by mouth daily.      Marland Kitchen levothyroxine (SYNTHROID, LEVOTHROID) 175 MCG tablet Take 175 mcg by mouth daily.        Marland Kitchen lovastatin (MEVACOR) 40 MG tablet Take 40 mg by mouth at bedtime.        . montelukast (SINGULAIR) 10 MG tablet Take 10 mg by mouth at bedtime.        . Tamsulosin HCl (FLOMAX) 0.4 MG CAPS Take 0.4 mg by mouth at bedtime.        Marland Kitchen dexlansoprazole (DEXILANT) 60  MG capsule Take 60 mg by mouth daily.      . fluticasone (FLONASE) 50 MCG/ACT nasal spray Place 2 sprays into the nose daily.  16 g  6  . lansoprazole (PREVACID) 30 MG capsule Take 30 mg by mouth daily.      . Mometasone Furo-Formoterol Fum (DULERA) 200-5 MCG/ACT AERO Inhale 2 puffs into the lungs 2 (two) times daily.  1 Inhaler  6  . omeprazole (PRILOSEC) 40 MG capsule Take 1 capsule (40 mg total) by mouth daily.  30 capsule  3  . PRESCRIPTION MEDICATION BLOOD PRESSURE MED        BP 114/78  Pulse 75  Temp 97.8 F (36.6 C) (Oral)  Resp 16  Wt 223 lb 1.9 oz (101.207 kg)  SpO2 95%    Objective:   Physical Exam  Constitutional: He appears well-developed and well-nourished. No distress.  Cardiovascular: Normal rate and regular rhythm.   No murmur heard. Pulmonary/Chest: Effort normal and breath sounds normal. No respiratory distress. He has no wheezes. He has no rales. He exhibits no tenderness.  Musculoskeletal: He exhibits no edema.    Psychiatric: He has a normal mood and affect. His behavior is normal. Judgment and thought content normal.          Assessment & Plan:

## 2012-10-01 MED ORDER — FLUTICASONE-SALMETEROL 250-50 MCG/DOSE IN AEPB: 1.0000 | INHALATION_SPRAY | Freq: Two times a day (BID) | RESPIRATORY_TRACT | Status: DC

## 2012-10-01 MED ORDER — LEVOTHYROXINE SODIUM 175 MCG PO TABS: 175.0000 ug | ORAL_TABLET | Freq: Every day | ORAL | Status: DC

## 2012-10-01 MED ORDER — FLUTICASONE PROPIONATE 50 MCG/ACT NA SUSP: 2.0000 | Freq: Every day | NASAL | Status: DC

## 2012-10-01 MED ORDER — MONTELUKAST SODIUM 10 MG PO TABS: 10.0000 mg | ORAL_TABLET | Freq: Every day | ORAL | Status: DC

## 2012-10-01 MED ORDER — ALBUTEROL SULFATE HFA 108 (90 BASE) MCG/ACT IN AERS: 2.0000 | INHALATION_SPRAY | Freq: Four times a day (QID) | RESPIRATORY_TRACT | Status: DC | PRN

## 2012-10-01 MED ORDER — OMEPRAZOLE 40 MG PO CPDR: 40.0000 mg | DELAYED_RELEASE_CAPSULE | Freq: Every day | ORAL | Status: DC

## 2012-10-01 MED ORDER — LOVASTATIN 40 MG PO TABS: 40.0000 mg | ORAL_TABLET | Freq: Every day | ORAL | Status: DC

## 2012-10-01 MED ORDER — AMLODIPINE BESY-BENAZEPRIL HCL 5-10 MG PO CAPS: 1.0000 | ORAL_CAPSULE | Freq: Every day | ORAL | Status: DC

## 2012-10-01 MED ORDER — TAMSULOSIN HCL 0.4 MG PO CAPS: 0.4000 mg | ORAL_CAPSULE | Freq: Every day | ORAL | Status: DC

## 2012-10-01 NOTE — Telephone Encounter (Signed)
Notified pt and pt's wife. He requests refills of all medications. Refills sent for 30 day supply x 3 refills each. Refill not provided on Dulera as pt reported he is using advair.

## 2012-10-04 ENCOUNTER — Telehealth: Payer: Self-pay | Admitting: Family

## 2012-10-04 MED ORDER — FLUTICASONE-SALMETEROL 250-50 MCG/DOSE IN AEPB: 1.0000 | INHALATION_SPRAY | Freq: Two times a day (BID) | RESPIRATORY_TRACT | Status: DC

## 2012-10-04 MED ORDER — ALBUTEROL SULFATE HFA 108 (90 BASE) MCG/ACT IN AERS: 2.0000 | INHALATION_SPRAY | Freq: Four times a day (QID) | RESPIRATORY_TRACT | Status: DC | PRN

## 2012-10-04 MED ORDER — LOVASTATIN 40 MG PO TABS: 40.0000 mg | ORAL_TABLET | Freq: Every day | ORAL | Status: DC

## 2012-10-04 MED ORDER — AMLODIPINE BESY-BENAZEPRIL HCL 5-10 MG PO CAPS: 1.0000 | ORAL_CAPSULE | Freq: Every day | ORAL | Status: DC

## 2012-10-04 MED ORDER — LEVOTHYROXINE SODIUM 175 MCG PO TABS: 175.0000 ug | ORAL_TABLET | Freq: Every day | ORAL | Status: DC

## 2012-10-04 MED ORDER — MONTELUKAST SODIUM 10 MG PO TABS: 10.0000 mg | ORAL_TABLET | Freq: Every day | ORAL | Status: DC

## 2012-10-04 MED ORDER — TAMSULOSIN HCL 0.4 MG PO CAPS: 0.4000 mg | ORAL_CAPSULE | Freq: Every day | ORAL | Status: DC

## 2012-10-04 MED ORDER — OMEPRAZOLE 40 MG PO CPDR: 40.0000 mg | DELAYED_RELEASE_CAPSULE | Freq: Every day | ORAL | Status: DC

## 2012-10-04 NOTE — Telephone Encounter (Signed)
Refills sent, detailed message left on home #.

## 2012-10-04 NOTE — Telephone Encounter (Signed)
Patients wife is requesting that we resend prescription refills to [facility] at [address] for 90 days supply, not 30 days.

## 2012-12-31 ENCOUNTER — Encounter: Payer: Self-pay | Admitting: Family

## 2012-12-31 ENCOUNTER — Ambulatory Visit (INDEPENDENT_AMBULATORY_CARE_PROVIDER_SITE_OTHER): Payer: Medicare Other | Admitting: Family

## 2012-12-31 ENCOUNTER — Telehealth: Payer: Self-pay | Admitting: Family

## 2012-12-31 VITALS — BP 120/82 | HR 74 | Temp 97.7°F | Resp 16 | Ht 65.0 in | Wt 223.1 lb

## 2012-12-31 DIAGNOSIS — E785 Hyperlipidemia, unspecified: Secondary | ICD-10-CM

## 2012-12-31 DIAGNOSIS — Q613 Polycystic kidney, unspecified: Secondary | ICD-10-CM

## 2012-12-31 DIAGNOSIS — E039 Hypothyroidism, unspecified: Secondary | ICD-10-CM

## 2012-12-31 DIAGNOSIS — I10 Essential (primary) hypertension: Secondary | ICD-10-CM

## 2012-12-31 LAB — BASIC METABOLIC PANEL
BUN: 16 mg/dL (ref 6–23)
Calcium: 9.5 mg/dL (ref 8.4–10.5)
Creat: 0.98 mg/dL (ref 0.50–1.35)

## 2012-12-31 LAB — HEPATIC FUNCTION PANEL
Albumin: 4.8 g/dL (ref 3.5–5.2)
Total Bilirubin: 0.5 mg/dL (ref 0.3–1.2)

## 2012-12-31 LAB — LIPID PANEL
Cholesterol: 168 mg/dL (ref 0–200)
HDL: 46 mg/dL (ref >39)
Total CHOL/HDL Ratio: 3.7 Ratio
Triglycerides: 131 mg/dL (ref <150)

## 2012-12-31 LAB — TSH: TSH: 5.362 u[IU]/mL — ABNORMAL HIGH (ref 0.350–4.500)

## 2012-12-31 MED ORDER — LEVOTHYROXINE SODIUM 200 MCG PO TABS: 200.0000 ug | ORAL_TABLET | Freq: Every day | ORAL | Status: DC

## 2012-12-31 NOTE — Progress Notes (Signed)
Subjective:    Patient ID: Kyle Blair, male    DOB: 11-23-1961, 52 y.o.   MRN: [id number]  HPI  Kyle Blair is a 52 yr old male who presents today for follow up.  1) Hypothyroid- he continues synthroid.  2) Hyperlipidemia- he continues mevacor.  Denies myalgia.    3) Back pain- he "overdid it" during a trip to the mountains.  Now trying to "take it easy."   Review of Systems    see HPI  Past Medical History  Diagnosis Date  . Asthma   . Polycystic kidney disease   . Hypothyroid   . GERD with stricture   . BPH (benign prostatic hypertrophy)   . Hyperlipidemia   . Lung nodule 02/03/2012  . History of stroke 2004    History   Social History  . Marital Status: Married    Spouse Name: N/A    Number of Children: 2  . Years of Education: N/A   Occupational History  . DISABLED    Social History Main Topics  . Smoking status: Former Smoker -- 1.0 packs/day for 20 years    Types: Cigarettes    Quit date: 12/26/2004  . Smokeless tobacco: Never Used  . Alcohol Use: Yes     Comment: rarely  . Drug Use: No  . Sexually Active: Not on file   Other Topics Concern  . Not on file   Social History Narrative   Caffeine use:  1 pot coffee daily, 1 glass teaRegular exercise:  No. 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      Past Surgical History  Procedure Date  . Hernia repair 2005    with mesh  . Nasal polyp excision 1992 or 93    Family History  Problem Relation Age of Onset  . Heart disease Mother     pacemaker  . Diabetes Father   . Kidney disease Father   . Diabetes Sister     Allergies  Allergen Reactions  . Aspirin Anaphylaxis  . Ibuprofen Anaphylaxis  . Peanut Butter Flavor Anaphylaxis  . Cephalexin Rash    Current Outpatient Prescriptions on File Prior to Visit  Medication Sig Dispense Refill  . albuterol (PROVENTIL HFA;VENTOLIN HFA) 108  (90 BASE) MCG/ACT inhaler Inhale 2 puffs into the lungs every 6 (six) hours as needed. For shortness of breath and wheezing  3 Inhaler  0  . albuterol (PROVENTIL) (2.5 MG/3ML) 0.083% nebulizer solution Take 2.5 mg by nebulization every 6 (six) hours as needed. For shortness of breath and wheezing      . amLODipine-benazepril (LOTREL) 5-10 MG per capsule Take 1 capsule by mouth daily.  90 capsule  0  . fluticasone (FLONASE) 50 MCG/ACT nasal spray Place 2 sprays into the nose daily.  16 g  3  . Fluticasone-Salmeterol (ADVAIR DISKUS) 250-50 MCG/DOSE AEPB Inhale 1 puff into the lungs 2 (two) times daily.  180 each  0  . lansoprazole (PREVACID) 30 MG capsule Take 30 mg by mouth daily.      Marland Kitchen levothyroxine (SYNTHROID, LEVOTHROID) 175 MCG tablet Take 1 tablet (175 mcg total) by mouth daily.  90 tablet  0  . lovastatin (MEVACOR) 40 MG tablet Take 1 tablet (40 mg total) by mouth at bedtime.  90 tablet  0  . Mometasone Furo-Formoterol Fum (DULERA) 200-5 MCG/ACT AERO Inhale 2 puffs into the lungs 2 (two) times daily.  1  Inhaler  6  . montelukast (SINGULAIR) 10 MG tablet Take 1 tablet (10 mg total) by mouth at bedtime.  90 tablet  0  . omeprazole (PRILOSEC) 40 MG capsule Take 1 capsule (40 mg total) by mouth daily.  90 capsule  0  . Tamsulosin HCl (FLOMAX) 0.4 MG CAPS Take 1 capsule (0.4 mg total) by mouth at bedtime.  90 capsule  0    BP 120/82  Pulse 74  Temp 97.7 F (36.5 C) (Oral)  Resp 16  Ht 5\' 5"  (1.651 m)  Wt 223 lb 1.3 oz (101.188 kg)  BMI 37.12 kg/m2  SpO2 98%    Objective:   Physical Exam  Constitutional: He is oriented to person, place, and time. He appears well-developed and well-nourished. No distress.  HENT:  Head: Normocephalic and atraumatic.  Cardiovascular: Normal rate and regular rhythm.   No murmur heard. Pulmonary/Chest: Effort normal and breath sounds normal. No respiratory distress. He has no wheezes. He has no rales. He exhibits no tenderness.  Musculoskeletal: He  exhibits no edema.  Neurological: He is alert and oriented to person, place, and time.  Skin: Skin is warm and dry.  Psychiatric: He has a normal mood and affect. His behavior is normal. Judgment and thought content normal.          Assessment & Plan:

## 2012-12-31 NOTE — Assessment & Plan Note (Signed)
Continue synthroid, obtain TSH. 

## 2012-12-31 NOTE — Assessment & Plan Note (Signed)
Obtain bmet 

## 2012-12-31 NOTE — Telephone Encounter (Signed)
Reviewed labs.  Please call pt and let him know- Cholesterol, liver, kidney function looks good.  Thyroid medicine should be increased from to , repeat tsh in 1 month (dx hypothyroid).

## 2012-12-31 NOTE — Assessment & Plan Note (Signed)
Continue mevacor, obtain lipid panel, LFT.

## 2012-12-31 NOTE — Patient Instructions (Addendum)
Please complete your lab work prior to leaving. Please schedule a follow up appointment in 3 months.  

## 2013-01-01 NOTE — Telephone Encounter (Signed)
Notified pt's wife. Future lab order entered and copy given to the lab. Copy mailed to pt as reminder.

## 2013-01-22 NOTE — Telephone Encounter (Signed)
Pt presented to the lab, future order released. 

## 2013-01-22 NOTE — Addendum Note (Signed)
Addended by: Mervin Kung A on: 01/22/2013 04:19 PM   Modules accepted: Orders

## 2013-01-23 ENCOUNTER — Encounter: Payer: Self-pay | Admitting: Family

## 2013-02-25 ENCOUNTER — Telehealth: Payer: Self-pay | Admitting: Family

## 2013-02-25 MED ORDER — ALBUTEROL SULFATE HFA 108 (90 BASE) MCG/ACT IN AERS: 2.0000 | INHALATION_SPRAY | Freq: Four times a day (QID) | RESPIRATORY_TRACT | Status: DC | PRN

## 2013-02-25 MED ORDER — LOVASTATIN 40 MG PO TABS: 40.0000 mg | ORAL_TABLET | Freq: Every day | ORAL | Status: DC

## 2013-02-25 NOTE — Telephone Encounter (Signed)
Refill- lovastatin 40mg  tablets. Take one atblet by mouth at bedtime. Qty 30 last fill 2.2.14

## 2013-02-25 NOTE — Telephone Encounter (Signed)
Refill sent.

## 2013-02-25 NOTE — Telephone Encounter (Signed)
Notified pt's wife  

## 2013-02-25 NOTE — Telephone Encounter (Signed)
Note, rx sent for proair.

## 2013-02-25 NOTE — Telephone Encounter (Signed)
Received notification from [facility] that pt has received a 30 day supply of Proventil but medication is non-formulary. Formulary printed and forwarded to Provider to determine appropriate alternative if available. Please advise.

## 2013-02-28 ENCOUNTER — Telehealth: Payer: Self-pay | Admitting: Family

## 2013-02-28 MED ORDER — LEVOTHYROXINE SODIUM 200 MCG PO TABS: 200.0000 ug | ORAL_TABLET | Freq: Every day | ORAL | Status: DC

## 2013-02-28 NOTE — Telephone Encounter (Signed)
Refill sent.

## 2013-02-28 NOTE — Telephone Encounter (Signed)
refill-levothyroxine sod 0.2mg ( ). take one tablet by mouth every day. qty 30 last fill 2.4.14

## 2013-03-26 ENCOUNTER — Telehealth: Payer: Self-pay | Admitting: Family

## 2013-03-26 MED ORDER — FLUTICASONE-SALMETEROL 250-50 MCG/DOSE IN AEPB: 1.0000 | INHALATION_SPRAY | Freq: Two times a day (BID) | RESPIRATORY_TRACT | Status: DC

## 2013-03-26 NOTE — Telephone Encounter (Signed)
Refill sent.

## 2013-03-26 NOTE — Telephone Encounter (Signed)
Refill- advair diskus 250/46mcg(yellow). Inhale 1 puff into lungs twice daily. Qty 60 last fill 3.4.14

## 2013-03-27 ENCOUNTER — Telehealth: Payer: Self-pay | Admitting: Family

## 2013-03-27 MED ORDER — MONTELUKAST SODIUM 10 MG PO TABS: 10.0000 mg | ORAL_TABLET | Freq: Every day | ORAL | Status: DC

## 2013-03-27 NOTE — Telephone Encounter (Signed)
Refill- montelukast 10mg  tab. Take one tablet by mouth at bedtime. Qty 30 last fill 3.4.14  Refill- levothyroxine 0.175mg (143mcg)tab. Take one tablet by mouth daily. Qty 30 last fill 3.4.14

## 2013-03-27 NOTE — Telephone Encounter (Signed)
Levothyroxine refill last sent 02/28/13 #30 x 2 refills, should still have refills on file. Refill sent for montelukast. Left detailed message on pharmacy voicemail to use refills on file from 02/28/13 and to call if any questions.

## 2013-04-02 ENCOUNTER — Encounter: Payer: Self-pay | Admitting: Internal Medicine

## 2013-04-02 ENCOUNTER — Ambulatory Visit (INDEPENDENT_AMBULATORY_CARE_PROVIDER_SITE_OTHER): Payer: Medicare Other | Admitting: Family

## 2013-04-02 ENCOUNTER — Encounter: Payer: Self-pay | Admitting: Family

## 2013-04-02 VITALS — BP 126/86 | HR 67 | Temp 97.9°F | Resp 16

## 2013-04-02 DIAGNOSIS — E785 Hyperlipidemia, unspecified: Secondary | ICD-10-CM

## 2013-04-02 DIAGNOSIS — K529 Noninfective gastroenteritis and colitis, unspecified: Secondary | ICD-10-CM | POA: Insufficient documentation

## 2013-04-02 DIAGNOSIS — E039 Hypothyroidism, unspecified: Secondary | ICD-10-CM

## 2013-04-02 DIAGNOSIS — I10 Essential (primary) hypertension: Secondary | ICD-10-CM

## 2013-04-02 DIAGNOSIS — J45909 Unspecified asthma, uncomplicated: Secondary | ICD-10-CM

## 2013-04-02 DIAGNOSIS — R197 Diarrhea, unspecified: Secondary | ICD-10-CM

## 2013-04-02 NOTE — Patient Instructions (Addendum)
You will be contacted about your referral to GI.  Please let us know if you have not heard back within 1 week about your referral. Follow up in 3 months.

## 2013-04-02 NOTE — Assessment & Plan Note (Addendum)
?   IBS,, Will refer to GI.

## 2013-04-02 NOTE — Progress Notes (Signed)
Subjective:    Patient ID: Kyle Blair, male    DOB: [birth year], 52 y.o.   MRN: [id number]  HPI Kyle Blair is a 52 yr old male who presents today for follow up.  1) HTN- He is maintained on lotrel.   2) Hypothyroid- currently on synthroid . TSH normal last visit.  3) Hyperlipidemia- lipids at goal on mevacor. Denies myalgia.    4) asthma- He continues advair and prn albuterol. He is using albuterol 3 times a day.    5) Requests referral to Dr. Rhea Belton due to chronic diarrhea- average day 5 loose BM's a day. He has used pepto bismol and imodium now "plugged up." Denies bloody stools.   Review of Systems See HPI  Past Medical History  Diagnosis Date  . Asthma   . Polycystic kidney disease   . Hypothyroid   . GERD with stricture   . BPH (benign prostatic hypertrophy)   . Hyperlipidemia   . Lung nodule 02/03/2012  . History of stroke 2004    History   Social History  . Marital Status: Married    Spouse Name: N/A    Number of Children: 2  . Years of Education: N/A   Occupational History  . DISABLED    Social History Main Topics  . Smoking status: Former Smoker -- 1.00 packs/day for 20 years    Types: Cigarettes    Quit date: 12/26/2004  . Smokeless tobacco: Never Used  . Alcohol Use: Yes     Comment: rarely  . Drug Use: No  . Sexually Active: Not on file   Other Topics Concern  . Not on file   Social History Narrative   Caffeine use:  1 pot coffee daily, 1 glass tea   Regular exercise:  No. Has active lifestyle.   2 biological and 1 stepson.   Former smoker- quit in 2007   He is on disability- reports that he has a history of heat stroke.               Past Surgical History  Procedure Laterality Date  . Hernia repair  2005    with mesh  . Nasal polyp excision  1992 or 93    Family History  Problem Relation Age of Onset  . Heart disease Mother     pacemaker  . Diabetes Father   . Kidney disease Father   . Diabetes Sister      Allergies  Allergen Reactions  . Aspirin Anaphylaxis  . Ibuprofen Anaphylaxis  . Peanut Butter Flavor Anaphylaxis  . Cephalexin Rash    Current Outpatient Prescriptions on File Prior to Visit  Medication Sig Dispense Refill  . albuterol (PROAIR HFA) 108 (90 BASE) MCG/ACT inhaler Inhale 2 puffs into the lungs every 6 (six) hours as needed for wheezing.  1 Inhaler  5  . albuterol (PROVENTIL) (2.5 MG/3ML) 0.083% nebulizer solution Take 2.5 mg by nebulization every 6 (six) hours as needed. For shortness of breath and wheezing      . amLODipine-benazepril (LOTREL) 5-10 MG per capsule Take 1 capsule by mouth daily.  90 capsule  0  . Fluticasone-Salmeterol (ADVAIR DISKUS) 250-50 MCG/DOSE AEPB Inhale 1 puff into the lungs 2 (two) times daily.  180 each  0  . levothyroxine (SYNTHROID, LEVOTHROID) 200 MCG tablet Take 1 tablet (200 mcg total) by mouth daily.  30 tablet  2  . lovastatin (MEVACOR) 40 MG tablet Take 1 tablet (40 mg total) by mouth at bedtime.  90 tablet  0  . montelukast (SINGULAIR) 10 MG tablet Take 1 tablet (10 mg total) by mouth at bedtime.  90 tablet  0  . omeprazole (PRILOSEC) 40 MG capsule Take 1 capsule (40 mg total) by mouth daily.  90 capsule  0  . Tamsulosin HCl (FLOMAX) 0.4 MG CAPS Take 1 capsule (0.4 mg total) by mouth at bedtime.  90 capsule  0   No current facility-administered medications on file prior to visit.    BP 126/86  Pulse 67  Temp(Src) 97.9 F (36.6 C) (Oral)  Resp 16  SpO2 96%       Objective:   Physical Exam  Constitutional: He appears well-developed and well-nourished. No distress.  Cardiovascular: Normal rate and regular rhythm.   No murmur heard. Pulmonary/Chest: Effort normal and breath sounds normal. No respiratory distress. He has no wheezes. He has no rales. He exhibits no tenderness.  Musculoskeletal: He exhibits no edema.  Neurological: He is alert.  Skin: Skin is warm and dry.  Psychiatric: He has a normal mood and affect. His  behavior is normal. Judgment and thought content normal.          Assessment & Plan:

## 2013-04-02 NOTE — Assessment & Plan Note (Signed)
Stable on lotrel, cont same.

## 2013-04-02 NOTE — Assessment & Plan Note (Signed)
Fair control, continue singulair, advair, albuterol prn.

## 2013-04-02 NOTE — Assessment & Plan Note (Signed)
TSH stable on current dose, continue synthroid.

## 2013-04-02 NOTE — Assessment & Plan Note (Signed)
Lipids stable on mevacor, continue same.

## 2013-04-05 ENCOUNTER — Telehealth: Payer: Self-pay | Admitting: Family

## 2013-04-05 NOTE — Telephone Encounter (Signed)
Patients wife michelle called in stating that patient would like a 90 day supply of all medications to be sent to [facility]

## 2013-04-06 ENCOUNTER — Other Ambulatory Visit: Payer: Self-pay | Admitting: Family

## 2013-04-08 ENCOUNTER — Other Ambulatory Visit: Payer: Self-pay

## 2013-04-08 MED ORDER — ALBUTEROL SULFATE HFA 108 (90 BASE) MCG/ACT IN AERS: 2.0000 | INHALATION_SPRAY | Freq: Four times a day (QID) | RESPIRATORY_TRACT | Status: DC | PRN

## 2013-04-08 MED ORDER — OMEPRAZOLE 40 MG PO CPDR: 40.0000 mg | DELAYED_RELEASE_CAPSULE | Freq: Every day | ORAL | Status: DC

## 2013-04-08 MED ORDER — LOVASTATIN 40 MG PO TABS: 40.0000 mg | ORAL_TABLET | Freq: Every day | ORAL | Status: DC

## 2013-04-08 MED ORDER — AMLODIPINE BESY-BENAZEPRIL HCL 5-10 MG PO CAPS: 1.0000 | ORAL_CAPSULE | Freq: Every day | ORAL | Status: DC

## 2013-04-08 MED ORDER — LEVOTHYROXINE SODIUM 200 MCG PO TABS: 200.0000 ug | ORAL_TABLET | Freq: Every day | ORAL | Status: DC

## 2013-04-08 MED ORDER — FLUTICASONE-SALMETEROL 250-50 MCG/DOSE IN AEPB: 1.0000 | INHALATION_SPRAY | Freq: Two times a day (BID) | RESPIRATORY_TRACT | Status: DC

## 2013-04-08 MED ORDER — MONTELUKAST SODIUM 10 MG PO TABS: 10.0000 mg | ORAL_TABLET | Freq: Every day | ORAL | Status: DC

## 2013-04-08 MED ORDER — TAMSULOSIN HCL 0.4 MG PO CAPS: 0.4000 mg | ORAL_CAPSULE | Freq: Every day | ORAL | Status: DC

## 2013-04-08 MED ORDER — ALBUTEROL SULFATE (2.5 MG/3ML) 0.083% IN NEBU: 2.5000 mg | INHALATION_SOLUTION | Freq: Four times a day (QID) | RESPIRATORY_TRACT | Status: DC | PRN

## 2013-04-08 NOTE — Telephone Encounter (Signed)
Spoke to pharmacist at [facility] and cancelled the RX that were sent there today. Will resend to [facility]

## 2013-04-24 ENCOUNTER — Encounter: Payer: Self-pay | Admitting: Internal Medicine

## 2013-04-29 ENCOUNTER — Ambulatory Visit (INDEPENDENT_AMBULATORY_CARE_PROVIDER_SITE_OTHER): Payer: Medicare Other | Admitting: Internal Medicine

## 2013-04-29 ENCOUNTER — Encounter: Payer: Self-pay | Admitting: Internal Medicine

## 2013-04-29 ENCOUNTER — Other Ambulatory Visit (INDEPENDENT_AMBULATORY_CARE_PROVIDER_SITE_OTHER): Payer: Medicare Other

## 2013-04-29 VITALS — BP 110/72 | HR 80 | Ht 69.0 in | Wt 221.2 lb

## 2013-04-29 DIAGNOSIS — K219 Gastro-esophageal reflux disease without esophagitis: Secondary | ICD-10-CM

## 2013-04-29 DIAGNOSIS — R197 Diarrhea, unspecified: Secondary | ICD-10-CM

## 2013-04-29 DIAGNOSIS — Z1211 Encounter for screening for malignant neoplasm of colon: Secondary | ICD-10-CM

## 2013-04-29 DIAGNOSIS — K2270 Barrett's esophagus without dysplasia: Secondary | ICD-10-CM

## 2013-04-29 DIAGNOSIS — K529 Noninfective gastroenteritis and colitis, unspecified: Secondary | ICD-10-CM

## 2013-04-29 LAB — CBC
HCT: 47 % (ref 39.0–52.0)
Hemoglobin: 16.5 g/dL (ref 13.0–17.0)
MCHC: 35.1 g/dL (ref 30.0–36.0)
MCV: 85.4 fl (ref 78.0–100.0)
RDW: 12.7 % (ref 11.5–14.6)

## 2013-04-29 MED ORDER — PEG-KCL-NACL-NASULF-NA ASC-C 100 G PO SOLR: 1.0000 | Freq: Once | ORAL | Status: DC

## 2013-04-29 NOTE — Progress Notes (Signed)
Patient ID: Kyle Blair, male   DOB: Mar 01, 1961, 52 y.o.   MRN: [id number] HPI: Kyle Blair is a 52 yo male with PMH of GERD with Barrett's esophagus, polycystic kidney, hypothyroidism, BPH, hyperlipidemia who is seen in consultation at the request of Kyle Blair for evaluation of chronic diarrhea and history of Barrett's esophagus. He did come in today by his wife Kyle Blair (whom I see for her history of obesity, now status post Roux-en-Y complicated by anastomotic ulcer).  The patient reports issues with chronic diarrhea over the last 2 years. This is a daily occurrence and he reports bowel movements 5-6 times per day but can occur up to 10 times per day. He reports his stools are nonbloody with no history of melena. They can be quite explosive and urgent. Occasionally they do wake him from sleep but not often. Occasionally he has lower abdominal cramping associated with the diarrhea. Otherwise no abdominal pain. No fevers or chills. No new medications. He began Prilosec 3 and half years ago. He has used Pepto-Bismol and loperamide at times. Loperamide seemed to work well but at times "stopped me up too much").  He is taking Prilosec 40 mg daily for history of heartburn but also Barrett's esophagus. He reports his Barrett's esophagus was diagnosed approximately 3 years ago in [facility]. He also has a history of esophageal stricture, and describes 2 previous acute food impactions requiring endoscopy. He also remembers an esophageal dilation. Now he denies dysphagia or odynophagia. Occasionally he does have breakthrough heartburn, approximately every "a few weeks" for which he uses TUMS. He has lost approx 7 pounds in the last 3-6 months. He reports a good appetite. Denies early satiety.  Past Medical History  Diagnosis Date  . Asthma   . Polycystic kidney disease   . Hypothyroid   . GERD with stricture   . BPH (benign prostatic hypertrophy)   . Hyperlipidemia   . Lung nodule  02/03/2012  . History of stroke 2004    Past Surgical History  Procedure Laterality Date  . Hernia repair  2005    with mesh  . Nasal polyp excision  1992 or 93    Current Outpatient Prescriptions  Medication Sig Dispense Refill  . albuterol (PROAIR HFA) 108 (90 BASE) MCG/ACT inhaler Inhale 2 puffs into the lungs every 6 (six) hours as needed for wheezing.  3 Inhaler  1  . albuterol (PROVENTIL) (2.5 MG/3ML) 0.083% nebulizer solution Take 3 mLs (2.5 mg total) by nebulization every 6 (six) hours as needed. For shortness of breath and wheezing  75 mL  1  . amLODipine-benazepril (LOTREL) 5-10 MG per capsule Take 1 capsule by mouth daily.  90 capsule  0  . Fluticasone-Salmeterol (ADVAIR DISKUS) 250-50 MCG/DOSE AEPB Inhale 1 puff into the lungs 2 (two) times daily.  180 each  1  . levothyroxine (SYNTHROID, LEVOTHROID) 200 MCG tablet Take 1 tablet (200 mcg total) by mouth daily.  90 tablet  1  . lovastatin (MEVACOR) 40 MG tablet Take 1 tablet (40 mg total) by mouth at bedtime.  90 tablet  1  . montelukast (SINGULAIR) 10 MG tablet Take 1 tablet (10 mg total) by mouth at bedtime.  90 tablet  1  . omeprazole (PRILOSEC) 40 MG capsule Take 1 capsule (40 mg total) by mouth daily.  90 capsule  1  . tamsulosin (FLOMAX) 0.4 MG CAPS Take 1 capsule (0.4 mg total) by mouth at bedtime.  90 capsule  1  . peg 3350  powder (MOVIPREP) 100 G SOLR Take 1 kit (100 g total) by mouth once.  1 kit  0   No current facility-administered medications for this visit.    Allergies  Allergen Reactions  . Aspirin Anaphylaxis  . Ibuprofen Anaphylaxis  . Peanut Butter Flavor Anaphylaxis  . Cephalexin Rash    Family History  Problem Relation Age of Onset  . Heart disease Mother     pacemaker  . Diabetes Father   . Kidney disease Father   . Diabetes Sister     History  Substance Use Topics  . Smoking status: Former Smoker -- 1.00 packs/day for 20 years    Types: Cigarettes    Quit date: 12/26/2004  . Smokeless  tobacco: Never Used  . Alcohol Use: Yes     Comment: rarely    ROS: As per history of present illness, otherwise negative  BP 110/72  Pulse 80  Ht 5\' 9"  (1.753 m)  Wt 221 lb 3.2 oz (100.336 kg)  BMI 32.65 kg/m2 Constitutional: Well-developed and well-nourished. No distress. HEENT: Normocephalic and atraumatic. Oropharynx is clear and moist. No oropharyngeal exudate. Conjunctivae are normal.  No scleral icterus. Neck: Neck supple. Trachea midline. Cardiovascular: Normal rate, regular rhythm and intact distal pulses. No M/R/G Pulmonary/chest: Effort normal and breath sounds normal. No wheezing, rales or rhonchi. Abdominal: Soft, nontender, nondistended. Bowel sounds active throughout. There are no masses palpable. No hepatosplenomegaly. Extremities: no clubbing, cyanosis, or edema Lymphadenopathy: No cervical adenopathy noted. Neurological: Alert and oriented to person place and time. Skin: Skin is warm and dry. No rashes noted. Psychiatric: Normal mood and affect. Behavior is normal.  RELEVANT LABS AND IMAGING: CBC    Component Value Date/Time   WBC 5.5 05/22/2010 1330   RBC 5.47 05/22/2010 1330   HGB 16.7 05/22/2010 1330   HCT 47.5 05/22/2010 1330   PLT 152 05/22/2010 1330   MCV 86.8 05/22/2010 1330   MCHC 35.1 05/22/2010 1330   RDW 12.2 05/22/2010 1330   LYMPHSABS 1.2 05/22/2010 1330   MONOABS 0.6 05/22/2010 1330   EOSABS 0.0 05/22/2010 1330   BASOSABS 0.0 05/22/2010 1330    CMP     Component Value Date/Time   NA 140 12/31/2012 0959   K 4.1 12/31/2012 0959   CL 102 12/31/2012 0959   CO2 32 12/31/2012 0959   GLUCOSE 92 12/31/2012 0959   BUN 16 12/31/2012 0959   CREATININE 0.98 12/31/2012 0959   CREATININE .9 01/10/2011 1702   CALCIUM 9.5 12/31/2012 0959   PROT 6.8 12/31/2012 0959   ALBUMIN 4.8 12/31/2012 0959   AST 22 12/31/2012 0959   ALT 21 12/31/2012 0959   ALKPHOS 61 12/31/2012 0959   BILITOT 0.5 12/31/2012 0959   GFRNONAA >60 01/10/2011 1702   GFRAA  Value: >60        The eGFR has been  calculated using the MDRD equation. This calculation has not been validated in all clinical situations. eGFR's persistently <60 mL/min signify possible Chronic Kidney Disease. 01/10/2011 1702    ASSESSMENT/PLAN: 52 yo male with PMH of GERD with Barrett's esophagus, polycystic kidney, hypothyroidism, BPH, hyperlipidemia who is seen in consultation at the request of Kyle Blair for evaluation of chronic diarrhea and history of Barrett's esophagus.  1.  Chronic diarrhea/CRC screening -- his diarrhea/loose stools has been going on for approximately 2 years making it chronic. The differential includes irritable bowel, medication-induced diarrhea, microscopic colitis, and less likely inflammatory bowel disease.  He is due a screening colonoscopy, given that  he is 52 years old and never had one. The colonoscopy also can be performed to better evaluate his chronic diarrhea. If the colon is macroscopically normal I will plan biopsies to exclude microscopic colitis.  He can continue to use loperamide per box instructions for severe symptoms. I will also like to check a celiac panel and CBC today. His thyroid has been checked recently  2.  GERD with hx of Barrett's -- it sounds that he is due for Barrett's surveillance.  I will request his previous endoscopy and pathology records from his former GI doctor in [facility].  We will plan repeat upper endoscopy for Barrett's surveillance at the time of his colonoscopy. We will also examine the small bowel this time given his chronic diarrhea.  Further recommendations after labs and endoscopies

## 2013-04-29 NOTE — Patient Instructions (Addendum)
You have been scheduled for a colonoscopy with propofol. Please follow written instructions given to you at your visit today.  Please pick up your prep kit at the pharmacy within the next 1-3 days. If you use inhalers (even only as needed), please bring them with you on the day of your procedure. Your physician has requested that you go to www.[email or web address] and enter the access code given to you at your visit today. This web site gives a general overview about your procedure. However, you should still follow specific instructions given to you by our office regarding your preparation for the procedure.  Your physician has requested that you go to the basement for the following lab work before leaving today:   Continue taking Omeprazole and you can take Imodium over the counter for diarrhea                                               We are excited to introduce MyChart, a new best-in-class service that provides you online access to important information in your electronic medical record. We want to make it easier for you to view your health information - all in one secure location - when and where you need it. We expect MyChart will enhance the quality of care and service we provide.  When you register for MyChart, you can:    View your test results.    Request appointments and receive appointment reminders via email.    Request medication renewals.    View your medical history, allergies, medications and immunizations.    Communicate with your physician's office through a password-protected site.    Conveniently print information such as your medication lists.  To find out if MyChart is right for you, please talk to a member of our clinical staff today. We will gladly answer your questions about this free health and wellness tool.  If you are age 52 or older and want a member of your family to have access to your record, you must provide written consent by completing a proxy form  available at our office. Please speak to our clinical staff about guidelines regarding accounts for patients younger than age 27.  As you activate your MyChart account and need any technical assistance, please call the MyChart technical support line at [phone]-[phone] 8675092343) or email your question to [email or web address]. If you email your question(s), please include your name, a return phone number and the best time to reach you.  If you have non-urgent health-related questions, you can send a message to our office through MyChart at [email or web address]. If you have a medical emergency, call 911.  Thank you for using MyChart as your new health and wellness resource!   MyChart licensed from [employer],  [id number]. Patents Pending.

## 2013-04-30 ENCOUNTER — Encounter: Payer: Self-pay | Admitting: Internal Medicine

## 2013-05-03 ENCOUNTER — Encounter: Payer: Self-pay | Admitting: Internal Medicine

## 2013-05-03 ENCOUNTER — Ambulatory Visit (AMBULATORY_SURGERY_CENTER): Payer: Medicare Other | Admitting: Internal Medicine

## 2013-05-03 VITALS — BP 133/85 | HR 76 | Temp 97.1°F | Resp 19 | Ht 69.0 in | Wt 221.0 lb

## 2013-05-03 DIAGNOSIS — R197 Diarrhea, unspecified: Secondary | ICD-10-CM

## 2013-05-03 DIAGNOSIS — K219 Gastro-esophageal reflux disease without esophagitis: Secondary | ICD-10-CM

## 2013-05-03 DIAGNOSIS — K2270 Barrett's esophagus without dysplasia: Secondary | ICD-10-CM

## 2013-05-03 DIAGNOSIS — D126 Benign neoplasm of colon, unspecified: Secondary | ICD-10-CM

## 2013-05-03 DIAGNOSIS — K635 Polyp of colon: Secondary | ICD-10-CM

## 2013-05-03 DIAGNOSIS — Z1211 Encounter for screening for malignant neoplasm of colon: Secondary | ICD-10-CM

## 2013-05-03 DIAGNOSIS — K529 Noninfective gastroenteritis and colitis, unspecified: Secondary | ICD-10-CM

## 2013-05-03 DIAGNOSIS — D1330 Benign neoplasm of unspecified part of small intestine: Secondary | ICD-10-CM

## 2013-05-03 MED ORDER — SODIUM CHLORIDE 0.9 % IV SOLN: 500.0000 mL | INTRAVENOUS | Status: DC

## 2013-05-03 NOTE — Progress Notes (Addendum)
1252- pt ambulated around nurse's station- then taken to the bathroom to pass air.  States he was able to pass some more and now rates abd pain as a "5."  States, "I feel better so I want to go home."  Product/process development scientist, "It just keep getting better."  Abdomen distended but palpable.

## 2013-05-03 NOTE — Patient Instructions (Addendum)

## 2013-05-03 NOTE — Op Note (Addendum)
[facility] [address] [place], [address]   COLONOSCOPY PROCEDURE REPORT  PATIENT: Kyle, Blair  MR#: [id number] BIRTHDATE: Jan 18, 1961 , 51  yrs. old GENDER: Male ENDOSCOPIST: Beverley Fiedler, MD REFERRED XB:[id number] Peggyann Juba, FNP PROCEDURE DATE:  05/03/2013 PROCEDURE:   Colonoscopy with biopsy, Colonoscopy with snare polypectomy, and Submucosal injection, any substance ASA CLASS:   Class II INDICATIONS:average risk screening, chronic diarrhea, and first colonoscopy. MEDICATIONS: MAC sedation, administered by CRNA, Propofol (Diprivan), and Propofol (Diprivan) 650 mg IV  DESCRIPTION OF PROCEDURE:   After the risks benefits and alternatives of the procedure were thoroughly explained, informed consent was obtained.  A digital rectal exam revealed no rectal mass.   The LB [id number]-H180AL [id number]  endoscope was introduced through the anus and advanced to the ileocecal valve. No adverse events experienced.   The quality of the prep was good, using MoviPrep The instrument was then slowly withdrawn as the colon was fully examined.    COLON FINDINGS: Tortuous colon, despite manual pressure the scope was not able to be seated in the cecal base.  The cecum was incompletely visualized. A flat polyp measuring 10 mm in size was found in the proximal transverse colon.  A polypectomy was performed using snare cautery.  The resection was complete and the polyp tissue was completely retrieved.   A sessile polyp measuring 20 mm in size was found in the transverse colon.  Endoscopic mucosal resection was performed in a piecemeal fashion by injecting saline and indigo carmine into the submucosa to raise the lesion and polypectomy was performed with snare cautery.  The polyp lifted well after submucosal injection.  The resection was complete and the polyp tissue was completely retrieved.  There was no 'target sign' at EMR site.  There was no blood loss from polypectomy.    A sessile polyp measuring 8 mm in size was found in the rectosigmoid colon.  A polypectomy was performed using snare cautery.  The resection was complete and the polyp tissue was completely retrieved.   There was moderate diverticulosis noted at the hepatic flexure, in the descending colon, and sigmoid colon with associated muscular hypertrophy.  Otherwise normal colonic mucosa without evidence for inflammation.  Random biopsies were taken throughout the colon given chronic diarrhea.   Small internal hemorrhoids were found.  Retroflexed views revealed internal hemorrhoids.        The scope was withdrawn and the procedure completed.  COMPLICATIONS: There were no complications.  ENDOSCOPIC IMPRESSION: 1.   Flat polyp measuring 10 mm in size was found in the proximal transverse colon; polypectomy was performed using snare cautery 2.   Sessile polyp measuring 20 mm in size was found in the transverse colon; endoscopic mucosal resection was performed 3.   Sessile polyp measuring 8 mm in size was found in the rectosigmoid colon; polypectomy was performed using snare cautery 4.   There was moderate diverticulosis noted at the hepatic flexure, in the descending colon, and sigmoid colon 5.    Otherwise normal colonic mucosa; random biopsies performed. 6.   Small internal hemorrhoids  RECOMMENDATIONS: 1.  Hold aspirin, aspirin products, and anti-inflammatory medication for 2 weeks. 2.  Await pathology results 3.  High fiber diet 4.  Repeat colonoscopy in 3-6 months to ensure complete resection (1 hour slot) 5.  Can use loperamide per box instructions to control diarrhea/loose stools   eSigned:  Beverley Fiedler, MD 05/03/2013 12:08 PM Revised: 05/03/2013 12:08 PM  cc: The Patient  PATIENT NAME:  Kyle, Blair MR#: [id number]

## 2013-05-03 NOTE — Progress Notes (Addendum)
Called to room to assist during endoscopic procedure.  Patient ID and intended procedure confirmed with present staff. Received instructions for my participation in the procedure from the performing physician. ewm  Abdominal pressure applied by tech and RN to help advance scope to the cecum. Pt tolerated well. ewm

## 2013-05-03 NOTE — Op Note (Signed)
[facility] [address] [place], [address]   ENDOSCOPY PROCEDURE REPORT  PATIENT: Kyle, Blair  MR#: [id number] BIRTHDATE: [birth year] , 51  yrs. old GENDER: Male ENDOSCOPIST: Beverley Fiedler, MD REFERRED BY:  [name], FNP PROCEDURE DATE:  05/03/2013 PROCEDURE:  EGD w/ biopsy ASA CLASS:     Class II INDICATIONS:  history of GERD.   history of Barrett's esophagus. MEDICATIONS: MAC sedation, administered by CRNA and propofol (Diprivan) 200mg  IV TOPICAL ANESTHETIC: Cetacaine Spray  DESCRIPTION OF PROCEDURE: After the risks benefits and alternatives of the procedure were thoroughly explained, informed consent was obtained.  The LB GIF-H180 [id number] and LB GIF-H180 [id number] endoscope was introduced through the mouth and advanced to the second portion of the duodenum. Without limitations.  The instrument was slowly withdrawn as the mucosa was fully examined.   ESOPHAGUS: The mucosa of the esophagus appeared normal.   A variable Z-line was observed 40 cm from the incisors.  No definite evidence of Barrett's mucosa was seen.  Multiple biopsies were performed using cold forceps.  Sample obtained to rule out Barrett's esophagus given history of Barrett's metaplasia without dysplasia.   STOMACH: The mucosa of the stomach appeared normal.  DUODENUM: The duodenal mucosa showed no abnormalities in the bulb and second portion of the duodenum.  Cold forceps biopsies were taken in the bulb and second portion.  Retroflexed views revealed no abnormalities.     The scope was then withdrawn from the patient and the procedure completed.  COMPLICATIONS: There were no complications. ENDOSCOPIC IMPRESSION: 1.   The mucosa of the esophagus appeared normal 2.   Variable Z-line was observed 40 cm from the incisors; multiple biopsies 3.   The mucosa of the stomach appeared normal 4.   The duodenal mucosa showed no abnormalities in the bulb and second portion of the  duodenum  RECOMMENDATIONS: 1.  Await pathology results; repeat EGD based on pathology results. 2.  Continue taking your PPI (antiacid medicine) once daily.  It is best to be taken 20-30 minutes prior to breakfast meal.  eSigned:  Beverley Fiedler, MD 05/03/2013 11:40 AM CC:The Patient

## 2013-05-03 NOTE — Progress Notes (Addendum)
Patient did not experience any of the following events: a burn prior to discharge; a fall within the facility; wrong site/side/patient/procedure/implant event; or a hospital transfer or hospital admission upon discharge from the facility. (G8907)Patient did not have preoperative order for IV antibiotic SSI prophylaxis. [phone])  Pt was unable to pass gas in recovery for sometime, had pt lay on right side and pt finally started passing air, once 30 mins was over let pt get up and go to restroom to try to get rid of rest of air, pt stated he released some air while in restroom, pt is walking around recovery to help get the rest of the air out, adb is still distended and firm-adm  Baxter Hire- will take over helping pt walk to get rid of more air   Can not set up Repeat colonosocpy in 3-6 months due to schedule not being set up yet, told pt to call back when closer to time

## 2013-05-06 ENCOUNTER — Telehealth: Payer: Self-pay | Admitting: *Deleted

## 2013-05-06 NOTE — Telephone Encounter (Signed)
  Follow up Call-  Call back number 05/03/2013  Post procedure Call Back phone  # [phone]  Permission to leave phone message Yes     Patient questions:  Do you have a fever, pain , or abdominal swelling? no Pain Score  0 *  Have you tolerated food without any problems? yes  Have you been able to return to your normal activities? yes  Do you have any questions about your discharge instructions: Diet   no Medications  no Follow up visit  no  Do you have questions or concerns about your Care? no  Actions: * If pain score is 4 or above: No action needed, pain <4.

## 2013-05-08 ENCOUNTER — Encounter: Payer: Self-pay | Admitting: Internal Medicine

## 2013-05-08 DIAGNOSIS — Z86010 Personal history of colonic polyps: Secondary | ICD-10-CM | POA: Insufficient documentation

## 2013-05-15 LAB — TISSUE TRANSGLUTAMINASE, IGA: Tissue Transglutaminase Ab, IgA: 1 U/mL (ref <4)

## 2013-05-27 ENCOUNTER — Encounter: Payer: Self-pay | Admitting: Internal Medicine

## 2013-05-27 ENCOUNTER — Telehealth: Payer: Self-pay | Admitting: Family

## 2013-05-27 MED ORDER — LOVASTATIN 40 MG PO TABS: 40.0000 mg | ORAL_TABLET | Freq: Every day | ORAL | Status: DC

## 2013-05-27 NOTE — Telephone Encounter (Signed)
LOVASTATIN 40 MG TABLETS TAKE 1 TABLET BY MOUTH AT BEDTIME QTY 90 LAST FILL 02-23-2013

## 2013-05-27 NOTE — Telephone Encounter (Signed)
Refill sent to pharmacy.   

## 2013-06-24 ENCOUNTER — Ambulatory Visit (INDEPENDENT_AMBULATORY_CARE_PROVIDER_SITE_OTHER): Payer: Medicare Other | Admitting: Family

## 2013-06-24 ENCOUNTER — Encounter: Payer: Self-pay | Admitting: Family

## 2013-06-24 VITALS — BP 110/80 | HR 78 | Temp 98.4°F | Resp 16

## 2013-06-24 DIAGNOSIS — E039 Hypothyroidism, unspecified: Secondary | ICD-10-CM

## 2013-06-24 DIAGNOSIS — J4540 Moderate persistent asthma, uncomplicated: Secondary | ICD-10-CM

## 2013-06-24 DIAGNOSIS — J45909 Unspecified asthma, uncomplicated: Secondary | ICD-10-CM

## 2013-06-24 DIAGNOSIS — E785 Hyperlipidemia, unspecified: Secondary | ICD-10-CM

## 2013-06-24 DIAGNOSIS — I10 Essential (primary) hypertension: Secondary | ICD-10-CM

## 2013-06-24 MED ORDER — BUDESONIDE-FORMOTEROL FUMARATE 160-4.5 MCG/ACT IN AERO: 2.0000 | INHALATION_SPRAY | Freq: Two times a day (BID) | RESPIRATORY_TRACT | Status: DC

## 2013-06-24 NOTE — Assessment & Plan Note (Signed)
Clinically stable on current dose of synthroid- will continue. Obtain TSH.

## 2013-06-24 NOTE — Patient Instructions (Addendum)
Please follow up in 3 months.  

## 2013-06-24 NOTE — Progress Notes (Signed)
Subjective:    Patient ID: Kyle Blair, male    DOB: May 26, 1961, 52 y.o.   MRN: [id number]  HPI  Kyle Blair is a 52 yr old male who presents today for follow up.   Asthma- reports that this has generally been well controlled. He continues the advair and singulair. Reports that he uses his albuterol 3-4 times daily.    Hypothyroid- reports tolerating synthroid.   Hx hyperplastic colon polyp- on recent colo. He is scheduled for a 3 month follow up colonoscopy.   Review of Systems See HPI  Past Medical History  Diagnosis Date  . Asthma   . Polycystic kidney disease   . Hypothyroid   . GERD with stricture   . BPH (benign prostatic hypertrophy)   . Hyperlipidemia   . Lung nodule 02/03/2012  . History of stroke 2004    History   Social History  . Marital Status: Married    Spouse Name: N/A    Number of Children: 2  . Years of Education: N/A   Occupational History  . DISABLED    Social History Main Topics  . Smoking status: Former Smoker -- 1.00 packs/day for 20 years    Types: Cigarettes    Quit date: 12/26/2004  . Smokeless tobacco: Never Used  . Alcohol Use: Yes     Comment: rarely  . Drug Use: No  . Sexually Active: Not on file   Other Topics Concern  . Not on file   Social History Narrative   Caffeine use:  1 pot coffee daily, 1 glass tea   Regular exercise:  No. Has active lifestyle.   2 biological and 1 stepson.   Former smoker- quit in 2007   He is on disability- reports that he has a history of heat stroke.               Past Surgical History  Procedure Laterality Date  . Hernia repair  2005    with mesh  . Nasal polyp excision  1992 or 93  . Carpal tunnel release      Family History  Problem Relation Age of Onset  . Heart disease Mother     pacemaker  . Diabetes Father   . Kidney disease Father   . Diabetes Sister   . Kidney disease Sister     Allergies  Allergen Reactions  . Aspirin Anaphylaxis  . Ibuprofen Anaphylaxis  .  Peanut Butter Flavor Anaphylaxis  . Cephalexin Rash    Current Outpatient Prescriptions on File Prior to Visit  Medication Sig Dispense Refill  . albuterol (PROAIR HFA) 108 (90 BASE) MCG/ACT inhaler Inhale 2 puffs into the lungs every 6 (six) hours as needed for wheezing.  3 Inhaler  1  . albuterol (PROVENTIL) (2.5 MG/3ML) 0.083% nebulizer solution Take 3 mLs (2.5 mg total) by nebulization every 6 (six) hours as needed. For shortness of breath and wheezing  75 mL  1  . amLODipine-benazepril (LOTREL) 5-10 MG per capsule Take 1 capsule by mouth daily.  90 capsule  0  . levothyroxine (SYNTHROID, LEVOTHROID) 200 MCG tablet Take 1 tablet (200 mcg total) by mouth daily.  90 tablet  1  . lovastatin (MEVACOR) 40 MG tablet Take 1 tablet (40 mg total) by mouth at bedtime.  90 tablet  0  . montelukast (SINGULAIR) 10 MG tablet Take 1 tablet (10 mg total) by mouth at bedtime.  90 tablet  1  . omeprazole (PRILOSEC) 40 MG capsule Take 1 capsule (40  mg total) by mouth daily.  90 capsule  1  . tamsulosin (FLOMAX) 0.4 MG CAPS Take 1 capsule (0.4 mg total) by mouth at bedtime.  90 capsule  1   No current facility-administered medications on file prior to visit.    BP 110/80  Pulse 78  Temp(Src) 98.4 F (36.9 C) (Oral)  Resp 16  SpO2 96%       Objective:   Physical Exam  Constitutional: He appears well-developed and well-nourished. No distress.  HENT:  Head: Normocephalic and atraumatic.  Cardiovascular: Normal rate and regular rhythm.   No murmur heard. Pulmonary/Chest: Effort normal and breath sounds normal. No respiratory distress. He has no wheezes. He has no rales. He exhibits no tenderness.  Musculoskeletal: He exhibits no edema.  Skin: Skin is warm and dry.  Psychiatric: He has a normal mood and affect. His behavior is normal. Judgment and thought content normal.          Assessment & Plan:

## 2013-06-24 NOTE — Assessment & Plan Note (Signed)
BP Readings from Last 3 Encounters:  06/24/13 110/80  05/03/13 133/85  04/29/13 110/72   BP stable on lotrel.  Continue same, obtain bmet.

## 2013-06-24 NOTE — Assessment & Plan Note (Signed)
Deteriorated. Recommended trial of symbicort in place of advair to see if this helps.  Continue singulair.

## 2013-06-25 ENCOUNTER — Telehealth: Payer: Self-pay | Admitting: Family

## 2013-06-25 DIAGNOSIS — E039 Hypothyroidism, unspecified: Secondary | ICD-10-CM

## 2013-06-25 LAB — BASIC METABOLIC PANEL WITH GFR
Calcium: 9.4 mg/dL (ref 8.4–10.5)
Creat: 0.71 mg/dL (ref 0.50–1.35)
GFR, Est African American: >89 mL/min
GFR, Est Non African American: >89 mL/min
Sodium: 138 mEq/L (ref 135–145)

## 2013-06-25 LAB — HEPATIC FUNCTION PANEL
Albumin: 4.2 g/dL (ref 3.5–5.2)
Bilirubin, Direct: 0.1 mg/dL (ref 0.0–0.3)
Total Bilirubin: 0.5 mg/dL (ref 0.3–1.2)

## 2013-06-25 LAB — TSH: TSH: 0.187 u[IU]/mL — ABNORMAL LOW (ref 0.350–4.500)

## 2013-06-25 NOTE — Telephone Encounter (Signed)
Pls call pt and let him know that his thyroid medication is a little to strong.  I would like him to take one tablet by mouth daily except 1 day a week take 1/2 tablet only.  Repeat TSH in 6 weeks. Dx is hypothyroid.

## 2013-06-26 NOTE — Telephone Encounter (Signed)
Notified pt's wife, [name]. Future lab order entered.

## 2013-06-26 NOTE — Telephone Encounter (Signed)
LMOM with contact name and number for return call RE: results and further provider instructions/SLS  

## 2013-07-01 ENCOUNTER — Ambulatory Visit: Payer: Medicare Other | Admitting: Family

## 2013-07-02 ENCOUNTER — Telehealth: Payer: Self-pay | Admitting: Family

## 2013-07-02 MED ORDER — OMEPRAZOLE 40 MG PO CPDR: 40.0000 mg | DELAYED_RELEASE_CAPSULE | Freq: Every day | ORAL | Status: DC

## 2013-07-02 NOTE — Telephone Encounter (Signed)
Refill- omeprazole 40mg  capsules. Take one capsule by mouth daily. Qty 90 last fill 4.10.14

## 2013-07-22 ENCOUNTER — Telehealth: Payer: Self-pay | Admitting: Internal Medicine

## 2013-07-22 NOTE — Telephone Encounter (Signed)
Scheduled pt for 6th month COLON recall; PV 07/26/13, Colon 07/31/13.

## 2013-07-26 ENCOUNTER — Encounter: Payer: Self-pay | Admitting: Internal Medicine

## 2013-07-26 ENCOUNTER — Ambulatory Visit (AMBULATORY_SURGERY_CENTER): Payer: Medicare Other

## 2013-07-26 VITALS — Ht 66.0 in | Wt 229.0 lb

## 2013-07-26 DIAGNOSIS — Z860100 Personal history of colon polyps, unspecified: Secondary | ICD-10-CM

## 2013-07-26 MED ORDER — MOVIPREP 100 G PO SOLR: 1.0000 | Freq: Once | ORAL | Status: DC

## 2013-07-31 ENCOUNTER — Encounter: Payer: Medicare Other | Admitting: Internal Medicine

## 2013-08-01 ENCOUNTER — Other Ambulatory Visit: Payer: Self-pay | Admitting: Family

## 2013-08-07 ENCOUNTER — Encounter: Payer: Self-pay | Admitting: Internal Medicine

## 2013-08-07 ENCOUNTER — Ambulatory Visit (AMBULATORY_SURGERY_CENTER): Payer: Medicare Other | Admitting: Internal Medicine

## 2013-08-07 VITALS — BP 125/82 | HR 73 | Temp 97.0°F | Resp 23 | Ht 66.0 in | Wt 229.0 lb

## 2013-08-07 DIAGNOSIS — D126 Benign neoplasm of colon, unspecified: Secondary | ICD-10-CM

## 2013-08-07 DIAGNOSIS — Z86010 Personal history of colonic polyps: Secondary | ICD-10-CM

## 2013-08-07 MED ORDER — SODIUM CHLORIDE 0.9 % IV SOLN: 500.0000 mL | INTRAVENOUS | Status: DC

## 2013-08-07 NOTE — Patient Instructions (Addendum)
YOU HAD AN ENDOSCOPIC PROCEDURE TODAY AT THE [facility]: Refer to the procedure report that was given to you for any specific questions about what was found during the examination.  If the procedure report does not answer your questions, please call your gastroenterologist to clarify.  If you requested that your care partner not be given the details of your procedure findings, then the procedure report has been included in a sealed envelope for you to review at your convenience later.  YOU SHOULD EXPECT: Some feelings of bloating in the abdomen. Passage of more gas than usual.  Walking can help get rid of the air that was put into your GI tract during the procedure and reduce the bloating. If you had a lower endoscopy (such as a colonoscopy or flexible sigmoidoscopy) you may notice spotting of blood in your stool or on the toilet paper. If you underwent a bowel prep for your procedure, then you may not have a normal bowel movement for a few days.  DIET: Your first meal following the procedure should be a light meal and then it is ok to progress to your normal diet.  A half-sandwich or bowl of soup is an example of a good first meal.  Heavy or fried foods are harder to digest and may make you feel nauseous or bloated.  Likewise meals heavy in dairy and vegetables can cause extra gas to form and this can also increase the bloating.  Drink plenty of fluids but you should avoid alcoholic beverages for 24 hours.  ACTIVITY: Your care partner should take you home directly after the procedure.  You should plan to take it easy, moving slowly for the rest of the day.  You can resume normal activity the day after the procedure however you should NOT DRIVE or use heavy machinery for 24 hours (because of the sedation medicines used during the test).    SYMPTOMS TO REPORT IMMEDIATELY: A gastroenterologist can be reached at any hour.  During normal business hours, 8:30 AM to 5:00 PM Monday through Friday,  call [phone].  After hours and on weekends, please call the GI answering service at [phone] who will take a message and have the physician on call contact you.   Following lower endoscopy (colonoscopy or flexible sigmoidoscopy):  Excessive amounts of blood in the stool  Significant tenderness or worsening of abdominal pains  Swelling of the abdomen that is new, acute  Fever of 100F or higher    FOLLOW UP: If any biopsies were taken you will be contacted by phone or by letter within the next 1-3 weeks.  Call your gastroenterologist if you have not heard about the biopsies in 3 weeks.  Our staff will call the home number listed on your records the next business day following your procedure to check on you and address any questions or concerns that you may have at that time regarding the information given to you following your procedure. This is a courtesy call and so if there is no answer at the home number and we have not heard from you through the emergency physician on call, we will assume that you have returned to your regular daily activities without incident.  SIGNATURES/CONFIDENTIALITY: You and/or your care partner have signed paperwork which will be entered into your electronic medical record.  These signatures attest to the fact that that the information above on your After Visit Summary has been reviewed and is understood.  Full responsibility of the confidentiality   of this discharge information lies with you and/or your care-partner.   Information on polyps & diverticulosis & high fiber diet given to you today

## 2013-08-07 NOTE — Op Note (Signed)
[facility] [address] [place], [address]   COLONOSCOPY PROCEDURE REPORT  PATIENT: Kyle Blair, Kyle Blair Blair  MR#: [id number] BIRTHDATE: February 20, 1961 , 51  yrs. old GENDER: Male ENDOSCOPIST: Beverley Fiedler, MD PROCEDURE DATE:  08/07/2013 PROCEDURE:   Colonoscopy with snare polypectomy and Colonoscopy with cold biopsy polypectomy First Screening Colonoscopy - Avg.  risk and is 50 yrs.  old or older - No.  Prior Negative Screening - Now for repeat screening. N/A  History of Adenoma - Now for follow-up colonoscopy & has been > or = to 3 yrs.  Yes hx of adenoma.  Has been 3 or more years since last colonoscopy.  History of Adenoma - Now for follow-up colonoscopy & has been > or = to 3 yrs.  No.  It has been less than 3 yrs since last colonoscopy.  Medical reason.  Polyps Removed Today? Yes. ASA CLASS:   Class II INDICATIONS:Patient's personal history of adenomatous colon polyps, last colonoscopy with piecemeal resection 3 months ago. MEDICATIONS: MAC sedation, administered by CRNA, propofol (Diprivan) 450mg  IV, and Versed 8 mg IV  DESCRIPTION OF PROCEDURE:   After the risks benefits and alternatives of the procedure were thoroughly explained, informed consent was obtained.  A digital rectal exam revealed no rectal mass.   The LB [id number]  endoscope was introduced through the anus and advanced to the cecum, which was identified by both the appendix and ileocecal valve. No adverse events experienced. The quality of the prep was good, using MoviPrep  The instrument was then slowly withdrawn as the colon was fully examined.   COLON FINDINGS: There was moderate diverticulosis noted in the ascending colon, at the hepatic flexure, in the descending colon, and sigmoid colon with associated tortuosity, angulation and muscular hypertrophy.  Redundant colon. Two sessile polyps measuring 4-5 mm in size were found at the ileocecal valve and in the ascending colon.  Polypectomy  was performed with cold forceps (1) and using cold snare (1).  All resections were complete and all polyp tissue was completely retrieved.   A post-polypectomy scar with tattoo was noted in the proximal transverse colon.  Multiple biopsies of the area were performed using a cold snare and cold forceps to exclude residual adenoma.   Three sessile polyps measuring 4-7 mm in size were found in the transverse colon and at the splenic flexure.  Polypectomy was performed using cold snare (1) and with cold forceps (2).  All resections were complete and all polyp tissue was completely retrieved.  Retroflexed views revealed no abnormalities.      The scope was withdrawn and the procedure completed.  COMPLICATIONS: There were no complications.  ENDOSCOPIC IMPRESSION: 1.   There was moderate diverticulosis noted in the ascending colon, at the hepatic flexure, in the descending colon, and sigmoid colon 2.   Two sessile polyps measuring 4-5 mm in size were found at the ileocecal valve and in the ascending colon; Polypectomy was performed with cold forceps and using cold snare 3.   Post-polypectomy scar in the proximal transverse colon; multiple biopsies of the area were performed using a cold snare and forceps 4.   Three sessile polyps measuring 4-7 mm in size were found in the transverse colon and at the splenic flexure; Polypectomy was performed using cold snare and with cold forceps  RECOMMENDATIONS: 1.  Await pathology results 2.  High fiber diet 3.  Timing of repeat colonoscopy will be determined by pathology findings. 4.  You will receive a  letter within 1-2 weeks with the results of your biopsy as well as final recommendations.  Please call my office if you have not received a letter after 3 weeks.   eSigned:  Beverley Fiedler, MD 08/07/2013 3:19 PM   cc: The Patient and [name], Mena Goes   PATIENT NAME:  Kyle Blair, Kyle Blair Blair MR#: [id number]

## 2013-08-07 NOTE — Progress Notes (Signed)
Pt stable through procedure, oral airway in to maintain airway 6 L, IV meds given slowly as indicated. 1515- Transported to RR awake responsive comfotable

## 2013-08-07 NOTE — Progress Notes (Signed)
Patient did not experience any of the following events: a burn prior to discharge; a fall within the facility; wrong site/side/patient/procedure/implant event; or a hospital transfer or hospital admission upon discharge from the facility. (G8907) Patient did not have preoperative order for IV antibiotic SSI prophylaxis. (G8918)  

## 2013-08-07 NOTE — Progress Notes (Signed)
Called to room to assist during endoscopic procedure.  Patient ID and intended procedure confirmed with present staff. Received instructions for my participation in the procedure from the performing physician.  

## 2013-08-08 ENCOUNTER — Telehealth: Payer: Self-pay | Admitting: *Deleted

## 2013-08-08 NOTE — Telephone Encounter (Signed)
  Follow up Call-  Call back number 08/07/2013 05/03/2013  Post procedure Call Back phone  # [phone] hm [phone]  Permission to leave phone message Yes Yes     Patient questions:  Do you have a fever, pain , or abdominal swelling? no Pain Score  0 *  Have you tolerated food without any problems? yes  Have you been able to return to your normal activities? yes  Do you have any questions about your discharge instructions: Diet   no Medications  no Follow up visit  no  Do you have questions or concerns about your Care? no  Actions: * If pain score is 4 or above: No action needed, pain <4.

## 2013-08-13 ENCOUNTER — Encounter: Payer: Self-pay | Admitting: Internal Medicine

## 2013-09-10 ENCOUNTER — Emergency Department (HOSPITAL_BASED_OUTPATIENT_CLINIC_OR_DEPARTMENT_OTHER)
Admission: EM | Admit: 2013-09-10 | Discharge: 2013-09-10 | Disposition: A | Discharge status: Home / Self Care | Payer: Medicare Other | Attending: Emergency Medicine | Admitting: Emergency Medicine

## 2013-09-10 ENCOUNTER — Encounter (HOSPITAL_BASED_OUTPATIENT_CLINIC_OR_DEPARTMENT_OTHER): Payer: Self-pay | Admitting: *Deleted

## 2013-09-10 ENCOUNTER — Emergency Department (HOSPITAL_BASED_OUTPATIENT_CLINIC_OR_DEPARTMENT_OTHER): Payer: Medicare Other

## 2013-09-10 DIAGNOSIS — M1711 Unilateral primary osteoarthritis, right knee: Secondary | ICD-10-CM

## 2013-09-10 DIAGNOSIS — M1710 Unilateral primary osteoarthritis, unspecified knee: Secondary | ICD-10-CM | POA: Insufficient documentation

## 2013-09-10 DIAGNOSIS — K219 Gastro-esophageal reflux disease without esophagitis: Secondary | ICD-10-CM | POA: Insufficient documentation

## 2013-09-10 DIAGNOSIS — M25669 Stiffness of unspecified knee, not elsewhere classified: Secondary | ICD-10-CM | POA: Insufficient documentation

## 2013-09-10 DIAGNOSIS — Z8673 Personal history of transient ischemic attack (TIA), and cerebral infarction without residual deficits: Secondary | ICD-10-CM | POA: Insufficient documentation

## 2013-09-10 DIAGNOSIS — E039 Hypothyroidism, unspecified: Secondary | ICD-10-CM | POA: Insufficient documentation

## 2013-09-10 DIAGNOSIS — Z79899 Other long term (current) drug therapy: Secondary | ICD-10-CM | POA: Insufficient documentation

## 2013-09-10 DIAGNOSIS — J45909 Unspecified asthma, uncomplicated: Secondary | ICD-10-CM | POA: Insufficient documentation

## 2013-09-10 DIAGNOSIS — IMO0002 Reserved for concepts with insufficient information to code with codable children: Secondary | ICD-10-CM | POA: Insufficient documentation

## 2013-09-10 DIAGNOSIS — Z87891 Personal history of nicotine dependence: Secondary | ICD-10-CM | POA: Insufficient documentation

## 2013-09-10 DIAGNOSIS — N400 Enlarged prostate without lower urinary tract symptoms: Secondary | ICD-10-CM | POA: Insufficient documentation

## 2013-09-10 HISTORY — DX: Unspecified osteoarthritis, unspecified site: M19.90

## 2013-09-10 MED ORDER — MORPHINE SULFATE 4 MG/ML IJ SOLN
6.0000 mg | Freq: Once | INTRAMUSCULAR | Status: AC
  Administered 2013-09-10: 6 mg via INTRAMUSCULAR
  Filled 2013-09-10: qty 1

## 2013-09-10 MED ORDER — MORPHINE SULFATE 2 MG/ML IJ SOLN
INTRAMUSCULAR | Status: AC
  Filled 2013-09-10: qty 1

## 2013-09-10 MED ORDER — HYDROCODONE-ACETAMINOPHEN 5-325 MG PO TABS: 1.0000 | ORAL_TABLET | ORAL | Status: DC | PRN

## 2013-09-10 NOTE — ED Notes (Signed)
left knee pain x 2 days. Hx of bad knees per pt.

## 2013-09-10 NOTE — ED Notes (Signed)
Patient transported to X-ray 

## 2013-09-11 ENCOUNTER — Telehealth: Payer: Self-pay | Admitting: Family

## 2013-09-11 NOTE — Telephone Encounter (Signed)
pls arrange ed folow up in 1-2 weeks

## 2013-09-11 NOTE — Telephone Encounter (Signed)
Appointment scheduled for 09/25/13

## 2013-09-11 NOTE — ED Provider Notes (Signed)
CSN: [id number]     Arrival date & time 09/10/13  1623 History   First MD Initiated Contact with Patient 09/10/13 1823     Chief Complaint  Patient presents with  . Knee Pain   (Consider location/radiation/quality/duration/timing/severity/associated sxs/prior Treatment) Patient is a 52 y.o. male presenting with knee pain. The history is provided by the patient.  Knee Pain Location:  Knee Injury: no   Knee location:  L knee Pain details:    Quality:  Aching   Radiates to:  Does not radiate   Severity:  Severe   Onset quality:  Gradual   Duration:  2 days   Timing:  Constant   Progression:  Unchanged Chronicity:  Recurrent (similar episodes over past 2 years) Dislocation: no   Foreign body present:  No foreign bodies Tetanus status:  Unknown Prior injury to area:  No Relieved by:  Nothing Worsened by:  Bearing weight and flexion Ineffective treatments: tylenol. Associated symptoms: decreased ROM and stiffness   Associated symptoms: no back pain, no fatigue and no fever   Risk factors: no concern for non-accidental trauma, no frequent fractures, no known bone disorder, no obesity and no recent illness     Past Medical History  Diagnosis Date  . Asthma   . Polycystic kidney disease   . Hypothyroid   . GERD with stricture   . BPH (benign prostatic hypertrophy)   . Hyperlipidemia   . Lung nodule 02/03/2012  . History of stroke 2004  . Stroke   . Arthritis    Past Surgical History  Procedure Laterality Date  . Hernia repair  2005    with mesh  . Nasal polyp excision  1992 or 93  . Carpal tunnel release     Family History  Problem Relation Age of Onset  . Heart disease Mother     pacemaker  . Diabetes Father   . Kidney disease Father   . Diabetes Sister   . Kidney disease Sister   . Colon cancer Neg Hx   . Esophageal cancer Neg Hx   . Rectal cancer Neg Hx   . Stomach cancer Neg Hx    History  Substance Use Topics  . Smoking status: Former Smoker -- 1.00  packs/day for 20 years    Types: Cigarettes    Quit date: 12/26/2004  . Smokeless tobacco: Never Used  . Alcohol Use: Yes     Comment: rarely    Review of Systems  Constitutional: Negative for fever, activity change, appetite change and fatigue.  HENT: Negative for congestion, facial swelling, rhinorrhea and trouble swallowing.   Eyes: Negative for photophobia and pain.  Respiratory: Negative for cough, chest tightness and shortness of breath.   Cardiovascular: Negative for chest pain and leg swelling.  Gastrointestinal: Negative for nausea, vomiting, abdominal pain, diarrhea and constipation.  Endocrine: Negative for polydipsia and polyuria.  Genitourinary: Negative for dysuria, urgency, decreased urine volume and difficulty urinating.  Musculoskeletal: Positive for arthralgias and stiffness. Negative for back pain and gait problem.  Skin: Negative for color change, rash and wound.  Allergic/Immunologic: Negative for immunocompromised state.  Neurological: Negative for dizziness, facial asymmetry, speech difficulty, weakness, numbness and headaches.  Psychiatric/Behavioral: Negative for confusion, decreased concentration and agitation.    Allergies  Aspirin; Ibuprofen; Peanut butter flavor; and Cephalexin  Home Medications   Current Outpatient Rx  Name  Route  Sig  Dispense  Refill  . albuterol (PROAIR HFA) 108 (90 BASE) MCG/ACT inhaler   Inhalation  Inhale 2 puffs into the lungs every 6 (six) hours as needed for wheezing.   25.5 g   1   . albuterol (PROVENTIL) (2.5 MG/3ML) 0.083% nebulizer solution   Nebulization   Take 3 mLs (2.5 mg total) by nebulization every 6 (six) hours as needed. For shortness of breath and wheezing   75 mL   1   . amLODipine-benazepril (LOTREL) 5-10 MG per capsule   Oral   Take 1 capsule by mouth daily.   90 capsule   0   . budesonide-formoterol (SYMBICORT) 160-4.5 MCG/ACT inhaler   Inhalation   Inhale 2 puffs into the lungs 2 (two) times  daily.   1 Inhaler   3   . HYDROcodone-acetaminophen (NORCO) 5-325 MG per tablet   Oral   Take 1 tablet by mouth every 4 (four) hours as needed for pain.   15 tablet   0   . levothyroxine (SYNTHROID, LEVOTHROID) 200 MCG tablet   Oral   Take 200 mcg by mouth daily. 1 tab daily six days a week, 1/2 tablet one day a week.         . lovastatin (MEVACOR) 40 MG tablet   Oral   Take 1 tablet (40 mg total) by mouth at bedtime.   90 tablet   0   . montelukast (SINGULAIR) 10 MG tablet   Oral   Take 1 tablet (10 mg total) by mouth at bedtime.   90 tablet   0   . omeprazole (PRILOSEC) 40 MG capsule   Oral   Take 1 capsule (40 mg total) by mouth daily.   90 capsule   0   . tamsulosin (FLOMAX) 0.4 MG CAPS capsule   Oral   Take 1 capsule (0.4 mg total) by mouth at bedtime.   90 capsule   0    BP 140/87  Pulse 84  Temp(Src) 98 F (36.7 C) (Oral)  Resp 20  Ht 5\' 6"  (1.676 m)  Wt 229 lb (103.874 kg)  BMI 36.98 kg/m2  SpO2 98% Physical Exam  Constitutional: He is oriented to person, place, and time. He appears well-developed and well-nourished. No distress.  HENT:  Head: Normocephalic and atraumatic.  Mouth/Throat: No oropharyngeal exudate.  Eyes: Pupils are equal, round, and reactive to light.  Neck: Normal range of motion. Neck supple.  Cardiovascular: Normal rate, regular rhythm and normal heart sounds.  Exam reveals no gallop and no friction rub.   No murmur heard. Pulmonary/Chest: Effort normal and breath sounds normal. No respiratory distress. He has no wheezes. He has no rales.  Abdominal: Soft. Bowel sounds are normal. He exhibits no distension and no mass. There is no tenderness. There is no rebound and no guarding.  Musculoskeletal: He exhibits no edema.       Left knee: He exhibits decreased range of motion. He exhibits no swelling, no effusion, no deformity, no laceration, no erythema, normal alignment, no LCL laxity, normal patellar mobility and no MCL laxity.  Tenderness found. Medial joint line tenderness noted.  Neurological: He is alert and oriented to person, place, and time.  Skin: Skin is warm and dry.  Psychiatric: He has a normal mood and affect.    ED Course  Procedures (including critical care time) Labs Review Labs Reviewed - No data to display Imaging Review Dg Knee Complete 4 Views Left  09/10/2013   CLINICAL DATA:  Left knee pain. Difficulty with flexion.  EXAM: LEFT KNEE - COMPLETE 4+ VIEW  COMPARISON:  None.  FINDINGS: Mild marginal spurring in the medial compartment and along the tibial spine. Mild patellar spurring noted. No overt knee effusion.  No specific patellar malpositioning. No fracture or acute bony findings.  IMPRESSION: 1. Osteoarthritis with patellofemoral, medial compartmental, and tibial spine spurring. 2. No acute bony findings. If pain persists despite conservative therapy, MRI may be warranted for further characterization.   Electronically Signed   By: Herbie Baltimore   On: 09/10/2013 20:16    MDM   1. Osteoarthritis of right knee    Pt is a 52 y.o. male with Pmhx as above who presents with 2 days of recurrent left medial knee pain w/o known injury.  No s/sx on infection on exam. +ttp medial joint line w/o ligamentous laxity.  XR ordered which shows evidence of OA.  Have placed on norco for pain, pt will f/u with PCP.  Return precautions given for new or worsening symptoms including s/sx on infection.   1. Osteoarthritis of right knee         Shanna Cisco, MD 09/11/13 [phone]

## 2013-09-13 ENCOUNTER — Encounter: Payer: Self-pay | Admitting: Family

## 2013-09-13 ENCOUNTER — Ambulatory Visit (INDEPENDENT_AMBULATORY_CARE_PROVIDER_SITE_OTHER): Payer: Medicare Other | Admitting: Family

## 2013-09-13 VITALS — BP 140/90 | HR 85 | Temp 98.1°F | Resp 18 | Ht 65.0 in | Wt 229.0 lb

## 2013-09-13 DIAGNOSIS — IMO0002 Reserved for concepts with insufficient information to code with codable children: Secondary | ICD-10-CM

## 2013-09-13 DIAGNOSIS — M1710 Unilateral primary osteoarthritis, unspecified knee: Secondary | ICD-10-CM | POA: Insufficient documentation

## 2013-09-13 MED ORDER — HYDROCODONE-ACETAMINOPHEN 5-325 MG PO TABS: 1.0000 | ORAL_TABLET | Freq: Four times a day (QID) | ORAL | Status: DC | PRN

## 2013-09-13 NOTE — Patient Instructions (Addendum)
You will be contacted about your referral to the orthopedist. Please keep your upcoming follow up appointment.

## 2013-09-13 NOTE — Assessment & Plan Note (Addendum)
Will refer to orthopedics.  Refill provided for hydrocodone. Advised pt to avoid use of tylenol while on hydrocodone. Also advised pt to continue to avoid NSAIDS due to his polycystic kidney disease.

## 2013-09-13 NOTE — Progress Notes (Signed)
Subjective:    Patient ID: Elenor Legato, male    DOB: July 28, 1961, 52 y.o.   MRN: [id number]  HPI  Mr. Lack is a 52 yr old male who presents today with chief complaint of left knee pain. Pain has been present x 10 days.  Pain is improved slightly by pain medication. Pain is worsened by bending of the left knee. Reports that he has been taking 9 tylenol at a time. Pain worsened last week and he was seen in the ER. X ray was performed of the left knee and noted: Osteoarthritis with patellofemoral, medial compartmental, and tibial spine spurring. Reports pain improved briefly with morphine in the ER and then returned. Was given rx for hydrocodone.  He has run out of the 15 hydrocodone that were given. Since that time he has been taking up to 9 tylenol a day due to pain.     Review of Systems See HPI  Past Medical History  Diagnosis Date  . Asthma   . Polycystic kidney disease   . Hypothyroid   . GERD with stricture   . BPH (benign prostatic hypertrophy)   . Hyperlipidemia   . Lung nodule 02/03/2012  . History of stroke 2004  . Stroke   . Arthritis     History   Social History  . Marital Status: Married    Spouse Name: N/A    Number of Children: 2  . Years of Education: N/A   Occupational History  . DISABLED    Social History Main Topics  . Smoking status: Former Smoker -- 1.00 packs/day for 20 years    Types: Cigarettes    Quit date: 12/26/2004  . Smokeless tobacco: Never Used  . Alcohol Use: Yes     Comment: rarely  . Drug Use: No  . Sexual Activity: Not on file   Other Topics Concern  . Not on file   Social History Narrative   Caffeine use:  1 pot coffee daily, 1 glass tea   Regular exercise:  No. Has active lifestyle.   2 biological and 1 stepson.   Former smoker- quit in 2007   He is on disability- reports that he has a history of heat stroke.               Past Surgical History  Procedure Laterality Date  . Hernia repair  2005    with mesh  .  Nasal polyp excision  1992 or 93  . Carpal tunnel release      Family History  Problem Relation Age of Onset  . Heart disease Mother     pacemaker  . Diabetes Father   . Kidney disease Father   . Diabetes Sister   . Kidney disease Sister   . Colon cancer Neg Hx   . Esophageal cancer Neg Hx   . Rectal cancer Neg Hx   . Stomach cancer Neg Hx     Allergies  Allergen Reactions  . Aspirin Anaphylaxis  . Ibuprofen Anaphylaxis  . Peanut Butter Flavor Anaphylaxis  . Cephalexin Rash    Current Outpatient Prescriptions on File Prior to Visit  Medication Sig Dispense Refill  . albuterol (PROAIR HFA) 108 (90 BASE) MCG/ACT inhaler Inhale 2 puffs into the lungs every 6 (six) hours as needed for wheezing.  25.5 g  1  . albuterol (PROVENTIL) (2.5 MG/3ML) 0.083% nebulizer solution Take 3 mLs (2.5 mg total) by nebulization every 6 (six) hours as needed. For shortness of breath and wheezing  75 mL  1  . amLODipine-benazepril (LOTREL) 5-10 MG per capsule Take 1 capsule by mouth daily.  90 capsule  0  . budesonide-formoterol (SYMBICORT) 160-4.5 MCG/ACT inhaler Inhale 2 puffs into the lungs 2 (two) times daily.  1 Inhaler  3  . levothyroxine (SYNTHROID, LEVOTHROID) 200 MCG tablet Take 200 mcg by mouth daily. 1 tab daily six days a week, 1/2 tablet one day a week.      . lovastatin (MEVACOR) 40 MG tablet Take 1 tablet (40 mg total) by mouth at bedtime.  90 tablet  0  . montelukast (SINGULAIR) 10 MG tablet Take 1 tablet (10 mg total) by mouth at bedtime.  90 tablet  0  . omeprazole (PRILOSEC) 40 MG capsule Take 1 capsule (40 mg total) by mouth daily.  90 capsule  0  . tamsulosin (FLOMAX) 0.4 MG CAPS capsule Take 1 capsule (0.4 mg total) by mouth at bedtime.  90 capsule  0   No current facility-administered medications on file prior to visit.    BP 140/90  Pulse 85  Temp(Src) 98.1 F (36.7 C) (Oral)  Resp 18  Ht 5\' 5"  (1.651 m)  Wt 229 lb (103.874 kg)  BMI 38.11 kg/m2  SpO2 96%        Objective:   Physical Exam  Constitutional: He is oriented to person, place, and time. He appears well-developed and well-nourished. No distress.  HENT:  Head: Normocephalic and atraumatic.  Cardiovascular: Normal rate and regular rhythm.   No murmur heard. Pulmonary/Chest: Effort normal and breath sounds normal. No respiratory distress. He has no wheezes. He has no rales. He exhibits no tenderness.  Musculoskeletal:  Mild swelling left knee. Decreased ROM due to pain  Neurological: He is alert and oriented to person, place, and time.  Psychiatric: He has a normal mood and affect. His behavior is normal. Judgment and thought content normal.          Assessment & Plan:

## 2013-09-25 ENCOUNTER — Ambulatory Visit (INDEPENDENT_AMBULATORY_CARE_PROVIDER_SITE_OTHER): Payer: Medicare Other | Admitting: Family

## 2013-09-25 ENCOUNTER — Encounter: Payer: Self-pay | Admitting: Family

## 2013-09-25 ENCOUNTER — Ambulatory Visit: Payer: Medicare Other | Admitting: Family

## 2013-09-25 VITALS — BP 120/82 | HR 86 | Temp 97.7°F | Resp 16 | Ht 65.0 in | Wt 220.0 lb

## 2013-09-25 DIAGNOSIS — IMO0002 Reserved for concepts with insufficient information to code with codable children: Secondary | ICD-10-CM

## 2013-09-25 DIAGNOSIS — I10 Essential (primary) hypertension: Secondary | ICD-10-CM

## 2013-09-25 DIAGNOSIS — M179 Osteoarthritis of knee, unspecified: Secondary | ICD-10-CM

## 2013-09-25 DIAGNOSIS — E785 Hyperlipidemia, unspecified: Secondary | ICD-10-CM

## 2013-09-25 DIAGNOSIS — Z23 Encounter for immunization: Secondary | ICD-10-CM

## 2013-09-25 DIAGNOSIS — E039 Hypothyroidism, unspecified: Secondary | ICD-10-CM

## 2013-09-25 DIAGNOSIS — Q613 Polycystic kidney, unspecified: Secondary | ICD-10-CM

## 2013-09-25 DIAGNOSIS — M1710 Unilateral primary osteoarthritis, unspecified knee: Secondary | ICD-10-CM

## 2013-09-25 LAB — HEPATIC FUNCTION PANEL
ALT: 26 U/L (ref 0–53)
AST: 22 U/L (ref 0–37)
Albumin: 4.8 g/dL (ref 3.5–5.2)

## 2013-09-25 NOTE — Patient Instructions (Addendum)
Please complete your lab work prior to leaving. Keep apt with ortho. Follow up in 3 months.

## 2013-09-25 NOTE — Assessment & Plan Note (Signed)
Check tsh 

## 2013-09-25 NOTE — Assessment & Plan Note (Signed)
Check bmet 

## 2013-09-25 NOTE — Assessment & Plan Note (Signed)
Awaiting consult with ortho.

## 2013-09-25 NOTE — Assessment & Plan Note (Signed)
BP Readings from Last 3 Encounters:  09/25/13 120/82  09/13/13 140/90  09/10/13 140/87   BP stable on current meds.

## 2013-09-25 NOTE — Progress Notes (Signed)
Subjective:    Patient ID: Kyle Blair, male    DOB: 11/26/1961, 52 y.o.   MRN: [id number]  HPI  Kyle Blair is a 52 yr old male who presents today for follow up of multiple medical problems.  1) HTN-  Currently maintained on lotrel.  2) L knee pain- using hydrocodone for pain.   He has upcoming appointment with Ortho.    3) Hypothyroid- Currently on levothyroxine.  Due for follow up TSH today.   4) Asthma- pt reports that asthma is well controlled. He continues symbicort.    Review of Systems See HPI  Past Medical History  Diagnosis Date  . Asthma   . Polycystic kidney disease   . Hypothyroid   . GERD with stricture   . BPH (benign prostatic hypertrophy)   . Hyperlipidemia   . Lung nodule 02/03/2012  . History of stroke 2004  . Stroke   . Arthritis     History   Social History  . Marital Status: Married    Spouse Name: N/A    Number of Children: 2  . Years of Education: N/A   Occupational History  . DISABLED    Social History Main Topics  . Smoking status: Former Smoker -- 1.00 packs/day for 20 years    Types: Cigarettes    Quit date: 12/26/2004  . Smokeless tobacco: Never Used  . Alcohol Use: Yes     Comment: rarely  . Drug Use: No  . Sexual Activity: Not on file   Other Topics Concern  . Not on file   Social History Narrative   Caffeine use:  1 pot coffee daily, 1 glass tea   Regular exercise:  No. Has active lifestyle.   2 biological and 1 stepson.   Former smoker- quit in 2007   He is on disability- reports that he has a history of heat stroke.               Past Surgical History  Procedure Laterality Date  . Hernia repair  2005    with mesh  . Nasal polyp excision  1992 or 93  . Carpal tunnel release      Family History  Problem Relation Age of Onset  . Heart disease Mother     pacemaker  . Diabetes Father   . Kidney disease Father   . Diabetes Sister   . Kidney disease Sister   . Colon cancer Neg Hx   . Esophageal cancer  Neg Hx   . Rectal cancer Neg Hx   . Stomach cancer Neg Hx     Allergies  Allergen Reactions  . Aspirin Anaphylaxis  . Ibuprofen Anaphylaxis  . Peanut Butter Flavor Anaphylaxis  . Cephalexin Rash    Current Outpatient Prescriptions on File Prior to Visit  Medication Sig Dispense Refill  . albuterol (PROAIR HFA) 108 (90 BASE) MCG/ACT inhaler Inhale 2 puffs into the lungs every 6 (six) hours as needed for wheezing.  25.5 g  1  . albuterol (PROVENTIL) (2.5 MG/3ML) 0.083% nebulizer solution Take 3 mLs (2.5 mg total) by nebulization every 6 (six) hours as needed. For shortness of breath and wheezing  75 mL  1  . amLODipine-benazepril (LOTREL) 5-10 MG per capsule Take 1 capsule by mouth daily.  90 capsule  0  . budesonide-formoterol (SYMBICORT) 160-4.5 MCG/ACT inhaler Inhale 2 puffs into the lungs 2 (two) times daily.  1 Inhaler  3  . HYDROcodone-acetaminophen (NORCO) 5-325 MG per tablet Take 1 tablet by  mouth every 6 (six) hours as needed for pain.  40 tablet  0  . levothyroxine (SYNTHROID, LEVOTHROID) 200 MCG tablet Take 200 mcg by mouth daily. 1 tab daily six days a week, 1/2 tablet one day a week.      . lovastatin (MEVACOR) 40 MG tablet Take 1 tablet (40 mg total) by mouth at bedtime.  90 tablet  0  . montelukast (SINGULAIR) 10 MG tablet Take 1 tablet (10 mg total) by mouth at bedtime.  90 tablet  0  . omeprazole (PRILOSEC) 40 MG capsule Take 1 capsule (40 mg total) by mouth daily.  90 capsule  0  . tamsulosin (FLOMAX) 0.4 MG CAPS capsule Take 1 capsule (0.4 mg total) by mouth at bedtime.  90 capsule  0   No current facility-administered medications on file prior to visit.    BP 120/82  Pulse 86  Temp(Src) 97.7 F (36.5 C) (Oral)  Resp 16  Ht 5\' 5"  (1.651 m)  Wt 220 lb (99.791 kg)  BMI 36.61 kg/m2  SpO2 97%       Objective:   Physical Exam  Constitutional: He is oriented to person, place, and time. He appears well-developed and well-nourished. No distress.  HENT:  Head:  Normocephalic and atraumatic.  Cardiovascular: Normal rate and regular rhythm.   No murmur heard. Pulmonary/Chest: Effort normal and breath sounds normal. No respiratory distress. He has no wheezes. He has no rales. He exhibits no tenderness.  Musculoskeletal:  Decrease flexion of the left knee.   Neurological: He is alert and oriented to person, place, and time.  Psychiatric: He has a normal mood and affect. His behavior is normal. Thought content normal.          Assessment & Plan:

## 2013-09-26 ENCOUNTER — Encounter: Payer: Self-pay | Admitting: Family

## 2013-09-26 LAB — TSH: TSH: 1.518 u[IU]/mL (ref 0.350–4.500)

## 2013-10-14 ENCOUNTER — Ambulatory Visit: Payer: Medicare Other

## 2013-10-21 ENCOUNTER — Telehealth: Payer: Self-pay | Admitting: Family

## 2013-10-21 MED ORDER — HYDROCODONE-ACETAMINOPHEN 5-325 MG PO TABS: 1.0000 | ORAL_TABLET | Freq: Four times a day (QID) | ORAL | Status: DC | PRN

## 2013-10-21 NOTE — Telephone Encounter (Signed)
Patient wife called in stating that patient has been having extreme knee pain. She says that he has been taking tylenol for the pain but that is not helping. She would like to know if Melissa would call in pain medication for him? [facility]

## 2013-10-21 NOTE — Telephone Encounter (Signed)
See rx for hydrocodone. 

## 2013-10-21 NOTE — Telephone Encounter (Signed)
Rx placed at front desk for pick up and spouse has been notified.

## 2013-12-05 HISTORY — PX: KNEE ARTHROSCOPY: SUR90

## 2013-12-30 ENCOUNTER — Encounter: Payer: Self-pay | Admitting: Family

## 2013-12-30 ENCOUNTER — Ambulatory Visit: Payer: Medicare Other | Admitting: Physician Assistant

## 2013-12-30 ENCOUNTER — Ambulatory Visit (INDEPENDENT_AMBULATORY_CARE_PROVIDER_SITE_OTHER): Payer: Medicare Other | Admitting: Family

## 2013-12-30 VITALS — BP 100/82 | HR 78 | Temp 97.8°F | Resp 16 | Ht 65.0 in | Wt 218.0 lb

## 2013-12-30 DIAGNOSIS — M1710 Unilateral primary osteoarthritis, unspecified knee: Secondary | ICD-10-CM

## 2013-12-30 DIAGNOSIS — M179 Osteoarthritis of knee, unspecified: Secondary | ICD-10-CM

## 2013-12-30 DIAGNOSIS — IMO0002 Reserved for concepts with insufficient information to code with codable children: Secondary | ICD-10-CM

## 2013-12-30 DIAGNOSIS — J45909 Unspecified asthma, uncomplicated: Secondary | ICD-10-CM

## 2013-12-30 DIAGNOSIS — E039 Hypothyroidism, unspecified: Secondary | ICD-10-CM

## 2013-12-30 DIAGNOSIS — I10 Essential (primary) hypertension: Secondary | ICD-10-CM

## 2013-12-30 MED ORDER — FLUTICASONE-SALMETEROL 250-50 MCG/DOSE IN AEPB: 1.0000 | INHALATION_SPRAY | Freq: Two times a day (BID) | RESPIRATORY_TRACT | Status: DC

## 2013-12-30 NOTE — Assessment & Plan Note (Signed)
Stable.  Continue advair in place of symbicort (reminded pt to rinse mouth and gargle after use to prevent thrush) continue singulair, albuterol prn.

## 2013-12-30 NOTE — Assessment & Plan Note (Signed)
Stable on synthroid, continue same.   ?

## 2013-12-30 NOTE — Assessment & Plan Note (Signed)
Improving post arthroscopy- defer management to ortho.

## 2013-12-30 NOTE — Progress Notes (Signed)
Subjective:    Patient ID: Kyle Blair, male    DOB: 21-Mar-1961, 53 y.o.   MRN: [id number]  HPI  Kyle Blair is a 53 yr old male who presents today for follow up of multiple medical problems:  1) HTN- he continues lotrel.  He denies CP.  2) Hypothyroid- continues levothyroxine.  3) Asthma-  Reports that he stopped symbicort due to recurrent thrush.  Using advair instead and has not had any further thrush issues. Reports that his symptoms are improved on advair. Mild sob with exertion- such as going up/down the stairs.   4) OA- s/p bilateral Knee arthroscopy in December- reports symptoms improved.    Review of Systems See HPI  Past Medical History  Diagnosis Date  . Asthma   . Polycystic kidney disease   . Hypothyroid   . GERD with stricture   . BPH (benign prostatic hypertrophy)   . Hyperlipidemia   . Lung nodule 02/03/2012  . History of stroke 2004  . Stroke   . Arthritis     History   Social History  . Marital Status: Married    Spouse Name: N/A    Number of Children: 2  . Years of Education: N/A   Occupational History  . DISABLED    Social History Main Topics  . Smoking status: Former Smoker -- 1.00 packs/day for 20 years    Types: Cigarettes    Quit date: 12/26/2004  . Smokeless tobacco: Never Used  . Alcohol Use: Yes     Comment: rarely  . Drug Use: No  . Sexual Activity: Not on file   Other Topics Concern  . Not on file   Social History Narrative   Caffeine use:  1 pot coffee daily, 1 glass tea   Regular exercise:  No. Has active lifestyle.   2 biological and 1 stepson.   Former smoker- quit in 2007   He is on disability- reports that he has a history of heat stroke.               Past Surgical History  Procedure Laterality Date  . Hernia repair  2005    with mesh  . Nasal polyp excision  1992 or 93  . Carpal tunnel release    . Knee arthroscopy Bilateral 12/05/13    cyst and bone spur removed from left knee per pt.    Family  History  Problem Relation Age of Onset  . Heart disease Mother     pacemaker  . Diabetes Father   . Kidney disease Father   . Diabetes Sister   . Kidney disease Sister   . Colon cancer Neg Hx   . Esophageal cancer Neg Hx   . Rectal cancer Neg Hx   . Stomach cancer Neg Hx     Allergies  Allergen Reactions  . Aspirin Anaphylaxis  . Ibuprofen Anaphylaxis  . Peanut Butter Flavor Anaphylaxis  . Cephalexin Rash    Current Outpatient Prescriptions on File Prior to Visit  Medication Sig Dispense Refill  . albuterol (PROAIR HFA) 108 (90 BASE) MCG/ACT inhaler Inhale 2 puffs into the lungs every 6 (six) hours as needed for wheezing.  25.5 g  1  . albuterol (PROVENTIL) (2.5 MG/3ML) 0.083% nebulizer solution Take 3 mLs (2.5 mg total) by nebulization every 6 (six) hours as needed. For shortness of breath and wheezing  75 mL  1  . amLODipine-benazepril (LOTREL) 5-10 MG per capsule Take 1 capsule by mouth daily.  Naomi  capsule  0  . HYDROcodone-acetaminophen (NORCO) 5-325 MG per tablet Take 1 tablet by mouth every 6 (six) hours as needed for pain.  40 tablet  0  . levothyroxine (SYNTHROID, LEVOTHROID) 200 MCG tablet Take 200 mcg by mouth daily. 1 tab daily six days a week, 1/2 tablet one day a week.      . lovastatin (MEVACOR) 40 MG tablet Take 1 tablet (40 mg total) by mouth at bedtime.  90 tablet  0  . montelukast (SINGULAIR) 10 MG tablet Take 1 tablet (10 mg total) by mouth at bedtime.  90 tablet  0  . omeprazole (PRILOSEC) 40 MG capsule Take 1 capsule (40 mg total) by mouth daily.  90 capsule  0  . tamsulosin (FLOMAX) 0.4 MG CAPS capsule Take 1 capsule (0.4 mg total) by mouth at bedtime.  90 capsule  0  . budesonide-formoterol (SYMBICORT) 160-4.5 MCG/ACT inhaler Inhale 2 puffs into the lungs 2 (two) times daily.  1 Inhaler  3   No current facility-administered medications on file prior to visit.    BP 100/82  Pulse 78  Temp(Src) 97.8 F (36.6 C) (Oral)  Resp 16  Ht 5\' 5"  (1.651 m)  Wt  218 lb (98.884 kg)  BMI 36.28 kg/m2  SpO2 98%       Objective:   Physical Exam  Constitutional: He appears well-developed and well-nourished. No distress.  Cardiovascular: Normal rate and regular rhythm.   No murmur heard. Pulmonary/Chest: Effort normal and breath sounds normal. No respiratory distress. He has no wheezes. He has no rales. He exhibits no tenderness.  Musculoskeletal:  Mild knee swelling right knee          Assessment & Plan:

## 2013-12-30 NOTE — Assessment & Plan Note (Signed)
BP Readings from Last 3 Encounters:  12/30/13 100/82  09/25/13 120/82  09/13/13 140/90   BP stable, continue lotrel, obtain bmet.

## 2013-12-30 NOTE — Patient Instructions (Signed)
Please complete lab work prior to leaving. Follow up in 3 months.  

## 2013-12-31 ENCOUNTER — Other Ambulatory Visit: Payer: Self-pay | Admitting: Family

## 2014-01-04 ENCOUNTER — Other Ambulatory Visit: Payer: Self-pay | Admitting: Family

## 2014-01-10 LAB — BASIC METABOLIC PANEL
BUN: 11 mg/dL (ref 6–23)
CHLORIDE: 104 meq/L (ref 96–112)
CO2: 26 meq/L (ref 19–32)
Calcium: 9.2 mg/dL (ref 8.4–10.5)
Creat: 0.87 mg/dL (ref 0.50–1.35)
GLUCOSE: 85 mg/dL (ref 70–99)
POTASSIUM: 3.8 meq/L (ref 3.5–5.3)
Sodium: 138 mEq/L (ref 135–145)

## 2014-01-12 ENCOUNTER — Encounter: Payer: Self-pay | Admitting: Family

## 2014-02-18 ENCOUNTER — Other Ambulatory Visit: Payer: Self-pay | Admitting: Family

## 2014-03-03 ENCOUNTER — Other Ambulatory Visit: Payer: Self-pay | Admitting: Family

## 2014-03-13 ENCOUNTER — Encounter: Payer: Self-pay | Admitting: Physician Assistant

## 2014-03-13 ENCOUNTER — Ambulatory Visit (INDEPENDENT_AMBULATORY_CARE_PROVIDER_SITE_OTHER): Payer: Medicare Other | Admitting: Physician Assistant

## 2014-03-13 VITALS — BP 126/86 | HR 76 | Temp 97.8°F | Resp 20 | Ht 65.0 in | Wt 218.2 lb

## 2014-03-13 DIAGNOSIS — J45901 Unspecified asthma with (acute) exacerbation: Secondary | ICD-10-CM | POA: Insufficient documentation

## 2014-03-13 MED ORDER — PREDNISONE 20 MG PO TABS: ORAL_TABLET | ORAL | Status: DC

## 2014-03-13 MED ORDER — AZITHROMYCIN 250 MG PO TABS: ORAL_TABLET | ORAL | Status: DC

## 2014-03-13 NOTE — Patient Instructions (Signed)
Please take prednisone as directed.  Take antibiotic as prescribed.  Increase fluid intake.  Rest.  Continue asthma medications as directed.  Place a humidifier in the bedroom.  Plain Mucinex.  Take a multivitamin.  Call or return to clinic if symptoms are not improving.

## 2014-03-13 NOTE — Assessment & Plan Note (Signed)
Rx Azithromycin.  Rx PRednisone burst x 5 days.  Continue current asthma regimen.  Increase fluids. Rest.  Probiotic.  Plain Mucinex.  Humidifier in bedroom.

## 2014-03-13 NOTE — Progress Notes (Signed)
Pre visit review using our clinic review tool, if applicable. No additional management support is needed unless otherwise documented below in the visit note/SLS  

## 2014-03-13 NOTE — Progress Notes (Signed)
Patient presents to clinic today c/o exacerbation of asthma x 1 week.  Endorses increase sob with cough productive of sputum. Patient endorses wheeze and has increased use of albuterol rescue inhaler.  Denies pleuritic chest pain, fever or chills.  Past Medical History  Diagnosis Date  . Asthma   . Polycystic kidney disease   . Hypothyroid   . GERD with stricture   . BPH (benign prostatic hypertrophy)   . Hyperlipidemia   . Lung nodule 02/03/2012  . History of stroke 2004  . Stroke   . Arthritis     Current Outpatient Prescriptions on File Prior to Visit  Medication Sig Dispense Refill  . albuterol (PROAIR HFA) 108 (90 BASE) MCG/ACT inhaler Inhale 2 puffs into the lungs every 6 (six) hours as needed for wheezing.  25.5 g  1  . albuterol (PROVENTIL) (2.5 MG/3ML) 0.083% nebulizer solution Take 3 mLs (2.5 mg total) by nebulization every 6 (six) hours as needed. For shortness of breath and wheezing  75 mL  1  . amLODipine-benazepril (LOTREL) 5-10 MG per capsule Take 1 capsule by mouth daily.  90 capsule  0  . Fluticasone-Salmeterol (ADVAIR DISKUS) 250-50 MCG/DOSE AEPB Inhale 1 puff into the lungs 2 (two) times daily.  1 each  3  . HYDROcodone-acetaminophen (NORCO) 5-325 MG per tablet Take 1 tablet by mouth every 6 (six) hours as needed for pain.  40 tablet  0  . levothyroxine (SYNTHROID, LEVOTHROID) 200 MCG tablet Take 200 mcg by mouth daily. 1 tab daily six days a week, 1/2 tablet one day a week.      . levothyroxine (SYNTHROID, LEVOTHROID) 200 MCG tablet TAKE 1 TABLET BY MOUTH DAILY  30 tablet  2  . lovastatin (MEVACOR) 40 MG tablet TAKE 1 TABLET BY MOUTH EVERY NIGHT AT BEDTIME  90 tablet  1  . montelukast (SINGULAIR) 10 MG tablet Take 1 tablet (10 mg total) by mouth at bedtime.  90 tablet  0  . omeprazole (PRILOSEC) 40 MG capsule Take 1 capsule (40 mg total) by mouth daily.  90 capsule  0  . tamsulosin (FLOMAX) 0.4 MG CAPS capsule TAKE ONE CAPSULE BY MOUTH AT BEDTIME DAILY  90 capsule  0    No current facility-administered medications on file prior to visit.    Allergies  Allergen Reactions  . Aspirin Anaphylaxis  . Ibuprofen Anaphylaxis  . Peanut Butter Flavor Anaphylaxis  . Cephalexin Rash    Family History  Problem Relation Age of Onset  . Heart disease Mother     pacemaker  . Diabetes Father   . Kidney disease Father   . Diabetes Sister   . Kidney disease Sister   . Colon cancer Neg Hx   . Esophageal cancer Neg Hx   . Rectal cancer Neg Hx   . Stomach cancer Neg Hx     History   Social History  . Marital Status: Married    Spouse Name: N/A    Number of Children: 2  . Years of Education: N/A   Occupational History  . DISABLED    Social History Main Topics  . Smoking status: Former Smoker -- 1.00 packs/day for 20 years    Types: Cigarettes    Quit date: 12/26/2004  . Smokeless tobacco: Never Used  . Alcohol Use: Yes     Comment: rarely  . Drug Use: No  . Sexual Activity: None   Other Topics Concern  . None   Social History Narrative   Caffeine use:  1 pot coffee daily, 1 glass tea   Regular exercise:  No. Has active lifestyle.   2 biological and 1 stepson.   Former smoker- quit in 2007   He is on disability- reports that he has a history of heat stroke.              Review of Systems - See HPI.  All other ROS are negative.  BP 126/86  Pulse 76  Temp(Src) 97.8 F (36.6 C) (Oral)  Resp 20  Ht 5\' 5"  (1.651 m)  Wt 218 lb 4 oz (98.998 kg)  BMI 36.32 kg/m2  SpO2 98%  Physical Exam  Vitals reviewed. Constitutional: He is oriented to person, place, and time and well-developed, well-nourished, and in no distress.  HENT:  Head: Normocephalic and atraumatic.  Eyes: Conjunctivae are normal. Pupils are equal, round, and reactive to light.  Neck: Neck supple.  Cardiovascular: Normal rate, regular rhythm, normal heart sounds and intact distal pulses.   Pulmonary/Chest: Effort normal. No respiratory distress. He has wheezes. He has no  rales. He exhibits no tenderness.  Lymphadenopathy:    He has no cervical adenopathy.  Neurological: He is alert and oriented to person, place, and time.  Skin: Skin is warm and dry. No rash noted.  Psychiatric: Affect normal.    Recent Results (from the past 2160 hour(s))  BASIC METABOLIC PANEL     Status: None   Collection Time    01/10/14  9:53 AM      Result Value Ref Range   Sodium 138  135 - 145 mEq/L   Potassium 3.8  3.5 - 5.3 mEq/L   Chloride 104  96 - 112 mEq/L   CO2 26  19 - 32 mEq/L   Glucose, Bld 85  70 - 99 mg/dL   BUN 11  6 - 23 mg/dL   Creat 0.87  0.50 - 1.35 mg/dL   Calcium 9.2  8.4 - 10.5 mg/dL   Assessment/Plan: Asthma with acute exacerbation Rx Azithromycin.  Rx PRednisone burst x 5 days.  Continue current asthma regimen.  Increase fluids. Rest.  Probiotic.  Plain Mucinex.  Humidifier in bedroom.

## 2014-03-26 ENCOUNTER — Other Ambulatory Visit: Payer: Self-pay | Admitting: Family

## 2014-04-01 ENCOUNTER — Ambulatory Visit: Payer: Medicare Other | Admitting: Family

## 2014-04-08 ENCOUNTER — Ambulatory Visit: Payer: Medicare Other | Admitting: Family

## 2014-04-28 ENCOUNTER — Telehealth: Payer: Self-pay | Admitting: Family

## 2014-04-28 MED ORDER — AMLODIPINE BESY-BENAZEPRIL HCL 5-10 MG PO CAPS: 1.0000 | ORAL_CAPSULE | Freq: Every day | ORAL | Status: DC

## 2014-04-28 NOTE — Telephone Encounter (Signed)
Refill amlodipine

## 2014-04-28 NOTE — Telephone Encounter (Signed)
Refill sent.

## 2014-05-02 ENCOUNTER — Encounter: Payer: Self-pay | Admitting: Family

## 2014-05-02 ENCOUNTER — Ambulatory Visit (INDEPENDENT_AMBULATORY_CARE_PROVIDER_SITE_OTHER): Payer: Medicare Other | Admitting: Family

## 2014-05-02 VITALS — BP 114/72 | HR 66 | Temp 97.9°F | Resp 16 | Ht 65.0 in | Wt 216.0 lb

## 2014-05-02 DIAGNOSIS — G8929 Other chronic pain: Secondary | ICD-10-CM

## 2014-05-02 DIAGNOSIS — E039 Hypothyroidism, unspecified: Secondary | ICD-10-CM

## 2014-05-02 DIAGNOSIS — M1710 Unilateral primary osteoarthritis, unspecified knee: Secondary | ICD-10-CM

## 2014-05-02 DIAGNOSIS — IMO0002 Reserved for concepts with insufficient information to code with codable children: Secondary | ICD-10-CM

## 2014-05-02 DIAGNOSIS — M179 Osteoarthritis of knee, unspecified: Secondary | ICD-10-CM

## 2014-05-02 DIAGNOSIS — Z23 Encounter for immunization: Secondary | ICD-10-CM

## 2014-05-02 DIAGNOSIS — I10 Essential (primary) hypertension: Secondary | ICD-10-CM

## 2014-05-02 DIAGNOSIS — M25569 Pain in unspecified knee: Secondary | ICD-10-CM

## 2014-05-02 LAB — BASIC METABOLIC PANEL
BUN: 18 mg/dL (ref 6–23)
CALCIUM: 9.8 mg/dL (ref 8.4–10.5)
CO2: 25 meq/L (ref 19–32)
CREATININE: 0.84 mg/dL (ref 0.50–1.35)
Chloride: 102 mEq/L (ref 96–112)
GLUCOSE: 96 mg/dL (ref 70–99)
Potassium: 4.1 mEq/L (ref 3.5–5.3)
Sodium: 139 mEq/L (ref 135–145)

## 2014-05-02 LAB — TSH: TSH: 0.052 u[IU]/mL — ABNORMAL LOW (ref 0.350–4.500)

## 2014-05-02 MED ORDER — HYDROCODONE-ACETAMINOPHEN 5-325 MG PO TABS: 1.0000 | ORAL_TABLET | Freq: Four times a day (QID) | ORAL | Status: DC | PRN

## 2014-05-02 NOTE — Patient Instructions (Addendum)
You will be contacted re: your referral to pain management. Complete lab work prior to leaving.  Follow up in 3 months.

## 2014-05-02 NOTE — Progress Notes (Signed)
Pre visit review using our clinic review tool, if applicable. No additional management support is needed unless otherwise documented below in the visit note. 

## 2014-05-02 NOTE — Progress Notes (Signed)
Subjective:    Patient ID: Kyle Blair, male    DOB: 09-19-1961, 53 y.o.   MRN: [id number]  HPI  Kyle Blair is a 53 yr old male who presents today for follow up of multiple medical problems.  1) HTN- maintained on amlodpine-benazapril.  BP Readings from Last 3 Encounters:  05/02/14 114/72  03/13/14 126/86  12/30/13 100/82   2) Knee pain-  Has had "3 shots in each knee."  "Cleaned them out." Told he is not ready for a TKA.  Was referred to pain management by ortho. He didn't understand what this meant and asked them to cancel referral. Reports that on bad days he is taking up to 12 tylenol arthritis pills a day.    3) Immunizations- due for tetanus.   Review of Systems See HPI  Past Medical History  Diagnosis Date  . Asthma   . Polycystic kidney disease   . Hypothyroid   . GERD with stricture   . BPH (benign prostatic hypertrophy)   . Hyperlipidemia   . Lung nodule 02/03/2012  . History of stroke 2004  . Stroke   . Arthritis     History   Social History  . Marital Status: Married    Spouse Name: N/A    Number of Children: 2  . Years of Education: N/A   Occupational History  . DISABLED    Social History Main Topics  . Smoking status: Former Smoker -- 1.00 packs/day for 20 years    Types: Cigarettes    Quit date: 12/26/2004  . Smokeless tobacco: Never Used  . Alcohol Use: Yes     Comment: rarely  . Drug Use: No  . Sexual Activity: Not on file   Other Topics Concern  . Not on file   Social History Narrative   Caffeine use:  1 pot coffee daily, 1 glass tea   Regular exercise:  No. Has active lifestyle.   2 biological and 1 stepson.   Former smoker- quit in 2007   He is on disability- reports that he has a history of heat stroke.               Past Surgical History  Procedure Laterality Date  . Hernia repair  2005    with mesh  . Nasal polyp excision  1992 or 93  . Carpal tunnel release    . Knee arthroscopy Bilateral 12/05/13    cyst and  bone spur removed from left knee per pt.    Family History  Problem Relation Age of Onset  . Heart disease Mother     pacemaker  . Diabetes Father   . Kidney disease Father   . Diabetes Sister   . Kidney disease Sister   . Colon cancer Neg Hx   . Esophageal cancer Neg Hx   . Rectal cancer Neg Hx   . Stomach cancer Neg Hx     Allergies  Allergen Reactions  . Aspirin Anaphylaxis  . Ibuprofen Anaphylaxis  . Peanut Butter Flavor Anaphylaxis  . Cephalexin Rash    Current Outpatient Prescriptions on File Prior to Visit  Medication Sig Dispense Refill  . albuterol (PROAIR HFA) 108 (90 BASE) MCG/ACT inhaler Inhale 2 puffs into the lungs every 6 (six) hours as needed for wheezing.  25.5 g  1  . albuterol (PROVENTIL) (2.5 MG/3ML) 0.083% nebulizer solution Take 3 mLs (2.5 mg total) by nebulization every 6 (six) hours as needed. For shortness of breath and wheezing  75  mL  1  . amLODipine-benazepril (LOTREL) 5-10 MG per capsule Take 1 capsule by mouth daily.  90 capsule  0  . azithromycin (ZITHROMAX Z-PAK) 250 MG tablet Take 2 tablets (500 mg) on  Day 1,  followed by 1 tablet (250 mg) once daily on Days 2 through 5.  6 each  0  . Fluticasone-Salmeterol (ADVAIR DISKUS) 250-50 MCG/DOSE AEPB Inhale 1 puff into the lungs 2 (two) times daily.  1 each  3  . levothyroxine (SYNTHROID, LEVOTHROID) 200 MCG tablet Take 200 mcg by mouth daily. 1 tab daily six days a week, 1/2 tablet one day a week.      . levothyroxine (SYNTHROID, LEVOTHROID) 200 MCG tablet TAKE 1 TABLET BY MOUTH DAILY  30 tablet  2  . lovastatin (MEVACOR) 40 MG tablet TAKE 1 TABLET BY MOUTH EVERY NIGHT AT BEDTIME  90 tablet  1  . montelukast (SINGULAIR) 10 MG tablet Take 1 tablet (10 mg total) by mouth at bedtime.  90 tablet  0  . omeprazole (PRILOSEC) 40 MG capsule TAKE 1 CAPSULE BY MOUTH DAILY.  90 capsule  0  . tamsulosin (FLOMAX) 0.4 MG CAPS capsule TAKE ONE CAPSULE BY MOUTH AT BEDTIME DAILY  90 capsule  0   No current  facility-administered medications on file prior to visit.    BP 114/72  Pulse 66  Temp(Src) 97.9 F (36.6 C) (Oral)  Resp 16  Ht 5\' 5"  (1.651 m)  Wt 216 lb 0.6 oz (97.995 kg)  BMI 35.95 kg/m2  SpO2 96%       Objective:   Physical Exam  Constitutional: He is oriented to person, place, and time. He appears well-developed and well-nourished. No distress.  HENT:  Head: Normocephalic and atraumatic.  Cardiovascular: Normal rate and regular rhythm.   No murmur heard. Pulmonary/Chest: Effort normal and breath sounds normal. No respiratory distress. He has no wheezes. He has no rales. He exhibits no tenderness.  Musculoskeletal: He exhibits no edema.  Neurological: He is alert and oriented to person, place, and time.  Psychiatric: He has a normal mood and affect. His behavior is normal. Judgment and thought content normal.          Assessment & Plan:

## 2014-05-03 ENCOUNTER — Telehealth: Payer: Self-pay | Admitting: Family

## 2014-05-03 DIAGNOSIS — E039 Hypothyroidism, unspecified: Secondary | ICD-10-CM

## 2014-05-03 MED ORDER — LEVOTHYROXINE SODIUM 175 MCG PO TABS: 175.0000 ug | ORAL_TABLET | Freq: Every day | ORAL | Status: DC

## 2014-05-03 NOTE — Telephone Encounter (Signed)
Labs show levothyroxine should be decreased to 114mcg.  Repeat TSH in 6 weeks, dx hypothyroid. Kidney function and electrolytes are normal.

## 2014-05-03 NOTE — Assessment & Plan Note (Signed)
Stable on current meds, continue same.  

## 2014-05-03 NOTE — Assessment & Plan Note (Signed)
Advised pt to limit tylenol to no greater than 3000mg  a day including tylenol included in hydrocodone tabs.  Will refer to pain management. Advised pt I will cover hydrocodone rx until he is established with pain management.

## 2014-05-03 NOTE — Assessment & Plan Note (Signed)
Clinically stable, obtain follow up tsh.  

## 2014-05-03 NOTE — Telephone Encounter (Signed)
Relevant patient education mailed to patient.  

## 2014-05-05 NOTE — Telephone Encounter (Signed)
Notified pt's wife. Entered lab order.

## 2014-05-28 ENCOUNTER — Other Ambulatory Visit: Payer: Self-pay | Admitting: Family

## 2014-06-27 ENCOUNTER — Other Ambulatory Visit: Payer: Self-pay | Admitting: Family

## 2014-06-28 ENCOUNTER — Other Ambulatory Visit: Payer: Self-pay | Admitting: Family

## 2014-07-27 ENCOUNTER — Other Ambulatory Visit: Payer: Self-pay | Admitting: Family

## 2014-08-05 ENCOUNTER — Ambulatory Visit: Payer: Medicare Other | Admitting: Family

## 2014-08-19 ENCOUNTER — Ambulatory Visit (INDEPENDENT_AMBULATORY_CARE_PROVIDER_SITE_OTHER): Payer: Medicare Other | Admitting: Family

## 2014-08-19 ENCOUNTER — Encounter: Payer: Self-pay | Admitting: Family

## 2014-08-19 VITALS — BP 120/88 | HR 76 | Temp 97.8°F | Resp 16 | Ht 65.0 in | Wt 223.1 lb

## 2014-08-19 DIAGNOSIS — M1710 Unilateral primary osteoarthritis, unspecified knee: Secondary | ICD-10-CM

## 2014-08-19 DIAGNOSIS — J45909 Unspecified asthma, uncomplicated: Secondary | ICD-10-CM

## 2014-08-19 DIAGNOSIS — M1711 Unilateral primary osteoarthritis, right knee: Secondary | ICD-10-CM

## 2014-08-19 DIAGNOSIS — Z23 Encounter for immunization: Secondary | ICD-10-CM

## 2014-08-19 DIAGNOSIS — E039 Hypothyroidism, unspecified: Secondary | ICD-10-CM

## 2014-08-19 DIAGNOSIS — I10 Essential (primary) hypertension: Secondary | ICD-10-CM

## 2014-08-19 DIAGNOSIS — J4540 Moderate persistent asthma, uncomplicated: Secondary | ICD-10-CM

## 2014-08-19 LAB — BASIC METABOLIC PANEL WITH GFR
BUN: 17 mg/dL (ref 6–23)
CALCIUM: 9.3 mg/dL (ref 8.4–10.5)
CO2: 27 mEq/L (ref 19–32)
CREATININE: 0.93 mg/dL (ref 0.50–1.35)
Chloride: 103 mEq/L (ref 96–112)
GFR, Est African American: >89 mL/min
Glucose, Bld: 92 mg/dL (ref 70–99)
Potassium: 4 mEq/L (ref 3.5–5.3)
Sodium: 138 mEq/L (ref 135–145)

## 2014-08-19 LAB — TSH: TSH: 24.123 u[IU]/mL — AB (ref 0.350–4.500)

## 2014-08-19 NOTE — Progress Notes (Signed)
Subjective:    Patient ID: Kyle Blair, male    DOB: May 05, 1961, 53 y.o.   MRN: [id number]  HPI  Kyle Blair is a 53 yr old male who presents today for follow up.    1) HTN- maintained on lotrel.  BP Readings from Last 3 Encounters:  08/19/14 120/88  05/02/14 114/72  03/13/14 126/86   2) Hypothyroid-maintained on synthroid.  Reports tolerating current dose.    3) Asthma-on advair, singulair and albuterol.  Reports that his asthma has been "OK." reports that he uses albuterol 2-3 times a day.  No thrush symptoms on advair.    4) OA- seeing pain management and [facility].  Had cortisone injection in his right knee. Report only helped x 2 days. He is starting the weekly cell therapy x 3 weeks.  Has been using his cane and his knee brace.     Review of Systems See HPI  Past Medical History  Diagnosis Date  . Asthma   . Polycystic kidney disease   . Hypothyroid   . GERD with stricture   . BPH (benign prostatic hypertrophy)   . Hyperlipidemia   . Lung nodule 02/03/2012  . History of stroke 2004  . Stroke   . Arthritis     History   Social History  . Marital Status: Married    Spouse Name: N/A    Number of Children: 2  . Years of Education: N/A   Occupational History  . DISABLED    Social History Main Topics  . Smoking status: Former Smoker -- 1.00 packs/day for 20 years    Types: Cigarettes    Quit date: 12/26/2004  . Smokeless tobacco: Never Used  . Alcohol Use: Yes     Comment: rarely  . Drug Use: No  . Sexual Activity: Not on file   Other Topics Concern  . Not on file   Social History Narrative   Caffeine use:  1 pot coffee daily, 1 glass tea   Regular exercise:  No. Has active lifestyle.   2 biological and 1 stepson.   Former smoker- quit in 2007   He is on disability- reports that he has a history of heat stroke.               Past Surgical History  Procedure Laterality Date  . Hernia repair  2005    with mesh  . Nasal polyp  excision  1992 or 93  . Carpal tunnel release    . Knee arthroscopy Bilateral 12/05/13    cyst and bone spur removed from left knee per pt.    Family History  Problem Relation Age of Onset  . Heart disease Mother     pacemaker  . Diabetes Father   . Kidney disease Father   . Diabetes Sister   . Kidney disease Sister   . Colon cancer Neg Hx   . Esophageal cancer Neg Hx   . Rectal cancer Neg Hx   . Stomach cancer Neg Hx     Allergies  Allergen Reactions  . Aspirin Anaphylaxis  . Ibuprofen Anaphylaxis  . Peanut Butter Flavor Anaphylaxis  . Cephalexin Rash    Current Outpatient Prescriptions on File Prior to Visit  Medication Sig Dispense Refill  . albuterol (PROAIR HFA) 108 (90 BASE) MCG/ACT inhaler Inhale 2 puffs into the lungs every 6 (six) hours as needed for wheezing.  25.5 g  1  . albuterol (PROVENTIL) (2.5 MG/3ML) 0.083% nebulizer solution Take 3 mLs (  2.5 mg total) by nebulization every 6 (six) hours as needed. For shortness of breath and wheezing  75 mL  1  . amLODipine-benazepril (LOTREL) 5-10 MG per capsule TAKE 1 CAPSULE BY MOUTH DAILY  90 capsule  0  . Fluticasone-Salmeterol (ADVAIR DISKUS) 250-50 MCG/DOSE AEPB Inhale 1 puff into the lungs 2 (two) times daily.  1 each  3  . levothyroxine (SYNTHROID, LEVOTHROID) 175 MCG tablet Take 1 tablet (175 mcg total) by mouth daily before breakfast.  30 tablet  1  . lovastatin (MEVACOR) 40 MG tablet TAKE 1 TABLET BY MOUTH EVERY NIGHT AT BEDTIME  90 tablet  1  . montelukast (SINGULAIR) 10 MG tablet Take 1 tablet (10 mg total) by mouth at bedtime.  90 tablet  0  . omeprazole (PRILOSEC) 40 MG capsule TAKE 1 CAPSULE BY MOUTH DAILY.  90 capsule  0  . tamsulosin (FLOMAX) 0.4 MG CAPS capsule TAKE 1 CAPSULE BY MOUTH EVERY NIGHT AT BEDTIME  90 capsule  0   No current facility-administered medications on file prior to visit.    BP 120/88  Pulse 76  Temp(Src) 97.8 F (36.6 C) (Oral)  Resp 16  Ht 5\' 5"  (1.651 m)  Wt 223 lb 1.9 oz  (101.207 kg)  BMI 37.13 kg/m2  SpO2 97%       Objective:   Physical Exam  Constitutional: He is oriented to person, place, and time. He appears well-developed and well-nourished. No distress.  HENT:  Head: Normocephalic and atraumatic.  Cardiovascular: Normal rate and regular rhythm.   No murmur heard. Pulmonary/Chest: Effort normal and breath sounds normal. No respiratory distress. He has no wheezes. He has no rales. He exhibits no tenderness.  Neurological: He is alert and oriented to person, place, and time.  Psychiatric: He has a normal mood and affect. His behavior is normal. Judgment and thought content normal.          Assessment & Plan:

## 2014-08-19 NOTE — Progress Notes (Signed)
Pre visit review using our clinic review tool, if applicable. No additional management support is needed unless otherwise documented below in the visit note. 

## 2014-08-19 NOTE — Patient Instructions (Signed)
Please complete lab work prior to leaving. Follow up in 3 months.  

## 2014-08-21 ENCOUNTER — Telehealth: Payer: Self-pay | Admitting: Family

## 2014-08-21 DIAGNOSIS — E039 Hypothyroidism, unspecified: Secondary | ICD-10-CM

## 2014-08-21 MED ORDER — LEVOTHYROXINE SODIUM 200 MCG PO TABS: 200.0000 ug | ORAL_TABLET | Freq: Every day | ORAL | Status: DC

## 2014-08-21 NOTE — Assessment & Plan Note (Addendum)
Management per ortho. 

## 2014-08-21 NOTE — Assessment & Plan Note (Signed)
Stable on current regimen, continue same.

## 2014-08-21 NOTE — Assessment & Plan Note (Signed)
TSH elevated, resume synthroid 258mcg once daily repeat tsh in 6 weeks.

## 2014-08-21 NOTE — Assessment & Plan Note (Signed)
BP stable on current meds. Continue same.  

## 2014-08-21 NOTE — Telephone Encounter (Signed)
Please confirm that pt has not missed any dose of synthroid.  Labs are showing that we need to increase his synthroid.  I would like him to return to 226mcg once daily and plan to repeat tsh in 6 weeks, dx hypothyroid.

## 2014-08-25 NOTE — Telephone Encounter (Signed)
Notified pt. He reports compliance with medication and voices understanding re: need to increase dose. Pt not able to schedule lab appt at this time and will call back to schedule. Lab order entered.

## 2014-08-26 ENCOUNTER — Encounter: Payer: Self-pay | Admitting: Internal Medicine

## 2014-08-26 ENCOUNTER — Other Ambulatory Visit: Payer: Self-pay | Admitting: Family

## 2014-08-27 ENCOUNTER — Telehealth: Payer: Self-pay

## 2014-08-27 ENCOUNTER — Other Ambulatory Visit: Payer: Self-pay | Admitting: Family

## 2014-08-27 NOTE — Telephone Encounter (Signed)
90 day supply already sent earlier this afternoon.

## 2014-08-27 NOTE — Telephone Encounter (Signed)
Caller name:Michelle Relation to OY:DXAJOI Call back number:[phone] Pharmacy:[facility] [address]  Reason for call: Sharyn Lull called and stated that [name] needs his omeprazole (PRILOSEC) 40 MG capsule and he needs 90 day supply

## 2014-09-02 ENCOUNTER — Telehealth: Payer: Self-pay | Admitting: Internal Medicine

## 2014-09-02 NOTE — Telephone Encounter (Signed)
There was no Barrett's at the time of his last colonoscopy But if he is having trouble swallowing can repeat EGD at the time of colonoscopy without need for office visit. Would consider for dilation along with upper endoscopy

## 2014-09-02 NOTE — Telephone Encounter (Signed)
Pt received recall colon letter. Pt states he has barrett's and he is having problems with reflux. States he is also starting to have problems swallowing and thinks he may need a dilation. Pt would like to have EGD with dil and colon done together. Wants to know if he needs an OV or can just be direct procedure.

## 2014-09-08 NOTE — Telephone Encounter (Signed)
Spoke with Sharyn Lull and she will have pt call back to schedule.  Pt called back and previsit and ECL with possible dil scheduled. Pt aware of appt.

## 2014-09-12 ENCOUNTER — Telehealth: Payer: Self-pay | Admitting: Family

## 2014-09-12 MED ORDER — FUROSEMIDE 20 MG PO TABS: 20.0000 mg | ORAL_TABLET | Freq: Every day | ORAL | Status: DC

## 2014-09-12 NOTE — Telephone Encounter (Signed)
Left message on pt's cell#.  Also, spoke with pt's wife and she states pt has been taking medication daily and was previously prescribed by kidney doctor.

## 2014-09-12 NOTE — Telephone Encounter (Signed)
Rx sent, has he been taking every day?  If not, he will need follow up bmet in 1-2 weeks dx (therapeutic drug monitoring).

## 2014-09-12 NOTE — Telephone Encounter (Signed)
Caller name: Sharyn Lull Relation to pt: wife Call back [place]: [facility] [address]  Reason for call:  Pt is wanting to get the Rx furosemide (LASIX) 20 MG tablet .  Pt's other pcp did not fill this Rx; pt's wife states other docotor forgot to fill it.

## 2014-09-23 ENCOUNTER — Other Ambulatory Visit: Payer: Self-pay | Admitting: Family

## 2014-09-25 ENCOUNTER — Ambulatory Visit (AMBULATORY_SURGERY_CENTER): Payer: Self-pay | Admitting: *Deleted

## 2014-09-25 VITALS — Ht 66.0 in | Wt 220.4 lb

## 2014-09-25 DIAGNOSIS — Z86010 Personal history of colonic polyps: Secondary | ICD-10-CM

## 2014-09-25 MED ORDER — MOVIPREP 100 G PO SOLR: 1.0000 | Freq: Once | ORAL | Status: DC

## 2014-09-25 NOTE — Progress Notes (Signed)
No home 02 use. ewm No egg or soy allergy. ewm No diet pills. ewm No problems with past sedation. ewm Pt declined emmi video. ewm

## 2014-09-26 ENCOUNTER — Telehealth: Payer: Self-pay | Admitting: *Deleted

## 2014-09-26 MED ORDER — LOVASTATIN 40 MG PO TABS: ORAL_TABLET | ORAL | Status: DC

## 2014-09-26 NOTE — Telephone Encounter (Signed)
Lovastatin refill sent.

## 2014-10-06 ENCOUNTER — Ambulatory Visit (AMBULATORY_SURGERY_CENTER): Payer: Medicare Other | Admitting: Internal Medicine

## 2014-10-06 ENCOUNTER — Encounter: Payer: Self-pay | Admitting: Internal Medicine

## 2014-10-06 VITALS — BP 133/88 | HR 81 | Temp 98.2°F | Resp 19 | Ht 66.0 in | Wt 220.0 lb

## 2014-10-06 DIAGNOSIS — D124 Benign neoplasm of descending colon: Secondary | ICD-10-CM

## 2014-10-06 DIAGNOSIS — D127 Benign neoplasm of rectosigmoid junction: Secondary | ICD-10-CM

## 2014-10-06 DIAGNOSIS — K219 Gastro-esophageal reflux disease without esophagitis: Secondary | ICD-10-CM

## 2014-10-06 DIAGNOSIS — Z86010 Personal history of colonic polyps: Secondary | ICD-10-CM

## 2014-10-06 DIAGNOSIS — R1319 Other dysphagia: Secondary | ICD-10-CM

## 2014-10-06 DIAGNOSIS — R1314 Dysphagia, pharyngoesophageal phase: Secondary | ICD-10-CM

## 2014-10-06 DIAGNOSIS — K621 Rectal polyp: Secondary | ICD-10-CM

## 2014-10-06 DIAGNOSIS — R1310 Dysphagia, unspecified: Secondary | ICD-10-CM

## 2014-10-06 MED ORDER — SODIUM CHLORIDE 0.9 % IV SOLN: 500.0000 mL | INTRAVENOUS | Status: DC

## 2014-10-06 NOTE — Progress Notes (Signed)
Procedure ends, to recovery, report given and VSS. 

## 2014-10-06 NOTE — Op Note (Signed)
[place]  [address] [place], [address]   COLONOSCOPY PROCEDURE REPORT  PATIENT: Kyle Blair, Kyle Blair  MR#: [id number] BIRTHDATE: 1961/05/02 , 31  yrs. old GENDER: male ENDOSCOPIST: Jerene Bears, MD PROCEDURE DATE:  10/06/2014 PROCEDURE:   Colonoscopy with snare polypectomy First Screening Colonoscopy - Avg.  risk and is 50 yrs.  old or older - No.  Prior Negative Screening - Now for repeat screening. N/A  History of Adenoma - Now for follow-up colonoscopy & has been > or = to 3 yrs.  No.  It has been less than 3 yrs since last colonoscopy.  Medical reason.  Polyps Removed Today? Yes. ASA CLASS:   Class III INDICATIONS: high risk personal history of colonic polyps, last colonoscopy Aug 2014 (initial colonoscopy May 2014 with piecemeal resection of transverse colon sessile serrated polyp) MEDICATIONS: Monitored anesthesia care and Propofol 370 mg IV  DESCRIPTION OF PROCEDURE:   After the risks benefits and alternatives of the procedure were thoroughly explained, informed consent was obtained.  The digital rectal exam revealed no rectal mass.   The LB [id number]  endoscope was introduced through the anus and advanced to the cecum, which was identified by both the appendix and ileocecal valve. No adverse events experienced. The quality of the prep was good, using MoviPrep  The instrument was then slowly withdrawn as the colon was fully examined.    COLON FINDINGS: Previously placed tattoo seen in transverse colon. This area was inspected carefully and no residual polyp tissue was seen.   Three sessile polyps ranging from 4 to 31mm in size were found in the descending colon and rectosigmoid colon. Polypectomies were performed with a cold snare.  The resection was complete, the polyp tissue was completely retrieved and sent to histology.   There was moderate diverticulosis noted in the transverse colon, ascending colon, sigmoid colon, and descending colon  with associated tortuosity.  Retroflexed views revealed internal hemorrhoids. The time to cecum=6 minutes 10 seconds. Withdrawal time=17 minutes 10 seconds.  The scope was withdrawn and the procedure completed.  COMPLICATIONS: There were no immediate complications.   ENDOSCOPIC IMPRESSION: 1.   Previously placed tattoo seen in transverse colon.  This area was inspected carefully and no residual polyp tissue was seen 2.   Three sessile polyps ranging from 4 to 43mm in size were found in the descending colon and rectosigmoid colon; polypectomies were performed with a cold snare 3.   There was moderate diverticulosis noted in the transverse colon, ascending colon, sigmoid colon, and descending colon  RECOMMENDATIONS: 1.  Await pathology results 2.  High fiber diet 3.  Timing of repeat colonoscopy will be determined by pathology findings. 4.  You will receive a letter within 1-2 weeks with the results of your biopsy as well as final recommendations.  Please call my office if you have not received a letter after 3 weeks.  eSigned:  Jerene Bears, MD 10/06/2014 4:33 PM   cc: The Patient and [name] FNP   PATIENT NAME:  Kyle Blair, Kyle Blair MR#: [id number]

## 2014-10-06 NOTE — Patient Instructions (Signed)
Discharge instructions given with verbal understanding. Handouts on polyps,diverticulosis, and a dilatation diet. Resume previous medications. YOU HAD AN ENDOSCOPIC PROCEDURE TODAY AT [facility]: Refer to the procedure report that was given to you for any specific questions about what was found during the examination.  If the procedure report does not answer your questions, please call your gastroenterologist to clarify.  If you requested that your care partner not be given the details of your procedure findings, then the procedure report has been included in a sealed envelope for you to review at your convenience later.  YOU SHOULD EXPECT: Some feelings of bloating in the abdomen. Passage of more gas than usual.  Walking can help get rid of the air that was put into your GI tract during the procedure and reduce the bloating. If you had a lower endoscopy (such as a colonoscopy or flexible sigmoidoscopy) you may notice spotting of blood in your stool or on the toilet paper. If you underwent a bowel prep for your procedure, then you may not have a normal bowel movement for a few days.  DIET: Your first meal following the procedure should be a light meal and then it is ok to progress to your normal diet.  A half-sandwich or bowl of soup is an example of a good first meal.  Heavy or fried foods are harder to digest and may make you feel nauseous or bloated.  Likewise meals heavy in dairy and vegetables can cause extra gas to form and this can also increase the bloating.  Drink plenty of fluids but you should avoid alcoholic beverages for 24 hours.  ACTIVITY: Your care partner should take you home directly after the procedure.  You should plan to take it easy, moving slowly for the rest of the day.  You can resume normal activity the day after the procedure however you should NOT DRIVE or use heavy machinery for 24 hours (because of the sedation medicines used during the test).    SYMPTOMS TO  REPORT IMMEDIATELY: A gastroenterologist can be reached at any hour.  During normal business hours, 8:30 AM to 5:00 PM Monday through Friday, call [phone].  After hours and on weekends, please call the GI answering service at [phone] who will take a message and have the physician on call contact you.   Following lower endoscopy (colonoscopy or flexible sigmoidoscopy):  Excessive amounts of blood in the stool  Significant tenderness or worsening of abdominal pains  Swelling of the abdomen that is new, acute  Fever of 100F or higher  Following upper endoscopy (EGD)  Vomiting of blood or coffee ground material  New chest pain or pain under the shoulder blades  Painful or persistently difficult swallowing  New shortness of breath  Fever of 100F or higher  Black, tarry-looking stools  FOLLOW UP: If any biopsies were taken you will be contacted by phone or by letter within the next 1-3 weeks.  Call your gastroenterologist if you have not heard about the biopsies in 3 weeks.  Our staff will call the home number listed on your records the next business day following your procedure to check on you and address any questions or concerns that you may have at that time regarding the information given to you following your procedure. This is a courtesy call and so if there is no answer at the home number and we have not heard from you through the emergency physician on call, we will assume that  you have returned to your regular daily activities without incident.  SIGNATURES/CONFIDENTIALITY: You and/or your care partner have signed paperwork which will be entered into your electronic medical record.  These signatures attest to the fact that that the information above on your After Visit Summary has been reviewed and is understood.  Full responsibility of the confidentiality of this discharge information lies with you and/or your care-partner.

## 2014-10-06 NOTE — Op Note (Signed)
[place]  [address]. [place], [address]   ENDOSCOPY PROCEDURE REPORT  PATIENT: Perez, Dirico  MR#: [id number] BIRTHDATE: Dec 22, 1961 , 32  yrs. old GENDER: male ENDOSCOPIST: Jerene Bears, MD PROCEDURE DATE:  10/06/2014 PROCEDURE:  EGD w/ balloon dilation ASA CLASS:     Class III INDICATIONS:  heartburn, history of GERD, and dysphagia. MEDICATIONS: Monitored anesthesia care and Propofol 350 mg IV TOPICAL ANESTHETIC: none  DESCRIPTION OF PROCEDURE: After the risks benefits and alternatives of the procedure were thoroughly explained, informed consent was obtained.  The LB [id number] endoscope was introduced through the mouth and advanced to the second portion of the duodenum , Without limitations.  The instrument was slowly withdrawn as the mucosa was fully examined.   ESOPHAGUS: A nonobstructing Schatzki ring was found at the gastroesophageal junction.   No evidence for Barrett's esophagus seen today. Remainder of esophageal mucosa unremarkable.  Given symptoms balloon dilation was performed using TTS balloon to 18 mm. Post dilation appearance without heme.  STOMACH: The mucosa of the stomach appeared normal.  DUODENUM: The duodenal mucosa showed no abnormalities in the bulb and 2nd part of the duodenum.  Retroflexed views revealed no abnormalities.     The scope was then withdrawn from the patient and the procedure completed.  COMPLICATIONS: There were no immediate complications.  ENDOSCOPIC IMPRESSION: 1.   Schatzki ring was found at the gastroesophageal junction; balloon dilation to 18 mm 2.   The mucosa of the stomach appeared normal 3.   The duodenal mucosa showed no abnormalities in the bulb and 2nd part of the duodenum  RECOMMENDATIONS: 1.  Change PPI to pantoprazole 40 mg daily (discontinue omeprazole) to see if this is more effective for heartburn/GERD.  Can also add ranitidine 150 mg in the evenings if needed for  breakthrough heartburn. 2.  Repeat dilation as needed 3.  Repeat EGD in 3 years (given remote history of Barrett's esophagus)  eSigned:  Jerene Bears, MD 10/06/2014 4:25 PM  CC:The Patient and [name], N.  P.

## 2014-10-07 ENCOUNTER — Telehealth: Payer: Self-pay

## 2014-10-07 NOTE — Telephone Encounter (Signed)
  Follow up Call-  Call back number 10/06/2014 08/07/2013 05/03/2013  Post procedure Call Back phone  # [phone] Sharyn Lull - wife [phone] hm [phone]  Permission to leave phone message Yes Yes Yes     Patient questions:  Do you have a fever, pain , or abdominal swelling? No. Pain Score  0 *  Have you tolerated food without any problems? Yes.    Have you been able to return to your normal activities? Yes.    Do you have any questions about your discharge instructions: Diet   No. Medications  No. Follow up visit  No.  Do you have questions or concerns about your Care? No.  Actions: * If pain score is 4 or above: No action needed, pain <4.

## 2014-10-13 ENCOUNTER — Encounter: Payer: Self-pay | Admitting: Internal Medicine

## 2014-10-14 ENCOUNTER — Telehealth: Payer: Self-pay | Admitting: Internal Medicine

## 2014-10-14 MED ORDER — PANTOPRAZOLE SODIUM 40 MG PO TBEC: 40.0000 mg | DELAYED_RELEASE_TABLET | Freq: Every day | ORAL | Status: DC

## 2014-10-14 NOTE — Telephone Encounter (Signed)
Rx sent 

## 2014-10-21 ENCOUNTER — Other Ambulatory Visit: Payer: Self-pay | Admitting: Family

## 2014-10-23 ENCOUNTER — Other Ambulatory Visit: Payer: Self-pay | Admitting: Family

## 2014-11-06 ENCOUNTER — Telehealth: Payer: Self-pay | Admitting: Family

## 2014-11-06 NOTE — Telephone Encounter (Signed)
Spoke with pt and advised him that he will need to be seen in the oiffice for evaluation before medication can be prescribed.  Pt declines appt today as PCP has no availability and he declines to see anyone except his PCP.  Appt scheduled for 11/07/14 at 1:45pm.

## 2014-11-06 NOTE — Telephone Encounter (Signed)
Caller name: Druckenmiller,Michelle Relation to pt: self  Call back number: [phone] Pharmacy: [facility]   Reason for call: pt requesting a refill of predisone and a z-pack. (advised pt do not see predisone on medication med list)

## 2014-11-07 ENCOUNTER — Ambulatory Visit (HOSPITAL_BASED_OUTPATIENT_CLINIC_OR_DEPARTMENT_OTHER)
Admission: RE | Admit: 2014-11-07 | Discharge: 2014-11-07 | Disposition: A | Discharge status: Home / Self Care | Payer: Medicare Other | Source: Ambulatory Visit | Attending: Family | Admitting: Family

## 2014-11-07 ENCOUNTER — Encounter: Payer: Self-pay | Admitting: Family

## 2014-11-07 ENCOUNTER — Ambulatory Visit (INDEPENDENT_AMBULATORY_CARE_PROVIDER_SITE_OTHER): Payer: Medicare Other | Admitting: Family

## 2014-11-07 VITALS — BP 122/80 | HR 86 | Temp 98.3°F | Resp 16 | Ht 65.0 in | Wt 214.0 lb

## 2014-11-07 DIAGNOSIS — R05 Cough: Secondary | ICD-10-CM | POA: Diagnosis present

## 2014-11-07 DIAGNOSIS — R062 Wheezing: Secondary | ICD-10-CM | POA: Diagnosis not present

## 2014-11-07 DIAGNOSIS — R0989 Other specified symptoms and signs involving the circulatory and respiratory systems: Secondary | ICD-10-CM | POA: Diagnosis not present

## 2014-11-07 DIAGNOSIS — R059 Cough, unspecified: Secondary | ICD-10-CM

## 2014-11-07 DIAGNOSIS — J4541 Moderate persistent asthma with (acute) exacerbation: Secondary | ICD-10-CM

## 2014-11-07 MED ORDER — PREDNISONE 10 MG PO TABS: ORAL_TABLET | ORAL | Status: DC

## 2014-11-07 NOTE — Assessment & Plan Note (Signed)
In setting or URI. CXR neg for pneumonia. Recommended:  Start prednisone, complete chest x ray on the first floor. Start prednisone taper. Use albuterol every 6 hours until symptoms are improved.  Call if symptoms worsen or if not improved in 2-3 days.

## 2014-11-07 NOTE — Patient Instructions (Signed)
Start prednisone, complete chest x ray on the first floor. Start prednisone taper. Use albuterol every 6 hours until symptoms are improved.  Call if symptoms worsen or if not improved in 2-3 days.

## 2014-11-07 NOTE — Progress Notes (Signed)
Notified pt. 

## 2014-11-07 NOTE — Progress Notes (Signed)
Subjective:    Patient ID: Kyle Blair, male    DOB: 08/29/1961, 53 y.o.   MRN: [id number]  HPI  Cough and chest congestion x 4 days.  Reports cough is productive of a dark yellow sputum.  Reports that he uses albuterol, advair, singulair. Reports some improvement with albuterol. Reports that yesterday he had chills/sweats.  Has tried dayquil/nyquil.  These have been helping his symptoms.  Reports + nasal drainage which is clear.  Reports sinus pressure, hoarseness.  Reports that his 2 family members recently had similar symptoms which resolved.    Review of Systems    see HPI  Past Medical History  Diagnosis Date  . Asthma   . Polycystic kidney disease   . Hypothyroid   . GERD with stricture   . BPH (benign prostatic hypertrophy)   . Hyperlipidemia   . Lung nodule 02/03/2012  . History of stroke 2004  . Stroke   . Arthritis     History   Social History  . Marital Status: Married    Spouse Name: N/A    Number of Children: 2  . Years of Education: N/A   Occupational History  . DISABLED    Social History Main Topics  . Smoking status: Former Smoker -- 1.00 packs/day for 20 years    Types: Cigarettes    Quit date: 12/26/2004  . Smokeless tobacco: Never Used  . Alcohol Use: Yes     Comment: rarely  . Drug Use: No  . Sexual Activity: Not on file   Other Topics Concern  . Not on file   Social History Narrative   Caffeine use:  1 pot coffee daily, 1 glass tea   Regular exercise:  No. Has active lifestyle.   2 biological and 1 stepson.   Former smoker- quit in 2007   He is on disability- reports that he has a history of heat stroke.               Past Surgical History  Procedure Laterality Date  . Hernia repair  2005    with mesh  . Nasal polyp excision  1992 or 93  . Carpal tunnel release    . Knee arthroscopy Bilateral 12/05/13    cyst and bone spur removed from left knee per pt.  . Colonoscopy    . Polypectomy      Family History  Problem  Relation Age of Onset  . Heart disease Mother     pacemaker  . Diabetes Father   . Kidney disease Father   . Diabetes Sister   . Kidney disease Sister   . Colon cancer Neg Hx   . Esophageal cancer Neg Hx   . Rectal cancer Neg Hx   . Stomach cancer Neg Hx   . Prostate cancer Neg Hx   . Pancreatic cancer Neg Hx     Allergies  Allergen Reactions  . Aspirin Anaphylaxis  . Ibuprofen Anaphylaxis  . Peanut Butter Flavor Anaphylaxis  . Cephalexin Rash    Current Outpatient Prescriptions on File Prior to Visit  Medication Sig Dispense Refill  . albuterol (PROAIR HFA) 108 (90 BASE) MCG/ACT inhaler Inhale 2 puffs into the lungs every 6 (six) hours as needed for wheezing. 25.5 g 1  . albuterol (PROVENTIL) (2.5 MG/3ML) 0.083% nebulizer solution Take 3 mLs (2.5 mg total) by nebulization every 6 (six) hours as needed. For shortness of breath and wheezing 75 mL 1  . amLODipine-benazepril (LOTREL) 5-10 MG per capsule TAKE  ONE CAPSULE BY MOUTH DAILY 90 capsule 0  . Fluticasone-Salmeterol (ADVAIR DISKUS) 250-50 MCG/DOSE AEPB Inhale 1 puff into the lungs 2 (two) times daily. 1 each 3  . furosemide (LASIX) 20 MG tablet Take 1 tablet (20 mg total) by mouth daily. 30 tablet 3  . levothyroxine (SYNTHROID, LEVOTHROID) 200 MCG tablet TAKE 1 TABLET BY MOUTH DAILY BEFORE BREAKFAST 30 tablet 0  . lovastatin (MEVACOR) 40 MG tablet TAKE 1 TABLET BY MOUTH EVERY NIGHT AT BEDTIME 90 tablet 1  . montelukast (SINGULAIR) 10 MG tablet TAKE 1 TABLET BY MOUTH EVERY NIGHT AT BEDTIME 90 tablet 0  . oxyCODONE-acetaminophen (PERCOCET) 10-325 MG per tablet Take 1 tablet by mouth daily.    . pantoprazole (PROTONIX) 40 MG tablet Take 1 tablet (40 mg total) by mouth daily. 30 tablet 4  . tamsulosin (FLOMAX) 0.4 MG CAPS capsule TAKE 1 CAPSULE BY MOUTH EVERY NIGHT AT BEDTIME 90 capsule 1   No current facility-administered medications on file prior to visit.    BP 122/80 mmHg  Pulse 86  Temp(Src) 98.3 F (36.8 C) (Oral)   Resp 16  Ht 5\' 5"  (1.651 m)  Wt 214 lb (97.07 kg)  BMI 35.61 kg/m2  SpO2 95%    Objective:   Physical Exam  Constitutional: He is oriented to person, place, and time. He appears well-developed and well-nourished. No distress.  HENT:  Head: Normocephalic and atraumatic.  Right Ear: Tympanic membrane and ear canal normal.  Left Ear: Tympanic membrane and ear canal normal.  Mouth/Throat: No oropharyngeal exudate, posterior oropharyngeal edema or posterior oropharyngeal erythema.  Cardiovascular: Normal rate and regular rhythm.   No murmur heard. Pulmonary/Chest: Effort normal. He has wheezes.  Musculoskeletal: He exhibits no edema.  Neurological: He is alert and oriented to person, place, and time.  Skin: Skin is warm and dry.  Psychiatric: He has a normal mood and affect. His behavior is normal. Judgment and thought content normal.          Assessment & Plan:

## 2014-11-07 NOTE — Progress Notes (Signed)
Pre visit review using our clinic review tool, if applicable. No additional management support is needed unless otherwise documented below in the visit note. 

## 2014-11-11 ENCOUNTER — Telehealth: Payer: Self-pay | Admitting: Family

## 2014-11-11 MED ORDER — AZITHROMYCIN 250 MG PO TABS: ORAL_TABLET | ORAL | Status: DC

## 2014-11-11 NOTE — Telephone Encounter (Signed)
rx sent for zpak. Needs to be seen back in office if not improved in 2-3 days.

## 2014-11-11 NOTE — Telephone Encounter (Signed)
Caller name: Nason Relation to pt: self Call back number: [phone] Pharmacy: [facility] on [address]   Reason for call:   Patient is requesting that an antiobiotic be called. He says that he is down to 3 prednisone left and is not feeling any better.

## 2014-11-11 NOTE — Telephone Encounter (Signed)
Notified pt's wife and she voices understanding. Wanted to know why we were not sending in more Prednisone. Advised her that it may be too soon to do another round of Prednisone and pt should see if symptoms approve with abx, if not then he will need to seen in the office for re-evaluation.

## 2014-11-17 ENCOUNTER — Ambulatory Visit (INDEPENDENT_AMBULATORY_CARE_PROVIDER_SITE_OTHER): Payer: Medicare Other | Admitting: Family

## 2014-11-17 ENCOUNTER — Encounter: Payer: Self-pay | Admitting: Family

## 2014-11-17 VITALS — BP 133/94 | HR 85 | Temp 98.1°F | Resp 16 | Ht 65.0 in | Wt 216.0 lb

## 2014-11-17 DIAGNOSIS — J4541 Moderate persistent asthma with (acute) exacerbation: Secondary | ICD-10-CM

## 2014-11-17 DIAGNOSIS — B370 Candidal stomatitis: Secondary | ICD-10-CM | POA: Insufficient documentation

## 2014-11-17 MED ORDER — NYSTATIN 100000 UNIT/ML MT SUSP: 5.0000 mL | Freq: Four times a day (QID) | OROMUCOSAL | Status: DC

## 2014-11-17 NOTE — Progress Notes (Signed)
Subjective:    Patient ID: Kyle Blair, male    DOB: [birth year], 53 y.o.   MRN: [id number]  HPI  Kyle Blair is a 53 yr old male who presents today for follow up of his acute asthma exacerbation. He was initially seen on 11/13.  H was placed on prednisone taper, albuterol.  CXR was negative for pneumonia. Symptoms did not improve and an rx was sent for zpak on 11/17.  Reports that he is "rid of the cold."  Though he has some thick sputum and has trouble clearing it.  He has not tried mucinex.   Reports wheezing is improved.  Denies fever.     Review of Systems See HPI  Past Medical History  Diagnosis Date  . Asthma   . Polycystic kidney disease   . Hypothyroid   . GERD with stricture   . BPH (benign prostatic hypertrophy)   . Hyperlipidemia   . Lung nodule 02/03/2012  . History of stroke 2004  . Stroke   . Arthritis     History   Social History  . Marital Status: Married    Spouse Name: N/A    Number of Children: 2  . Years of Education: N/A   Occupational History  . DISABLED    Social History Main Topics  . Smoking status: Former Smoker -- 1.00 packs/day for 20 years    Types: Cigarettes    Quit date: 12/26/2004  . Smokeless tobacco: Never Used  . Alcohol Use: Yes     Comment: rarely  . Drug Use: No  . Sexual Activity: Not on file   Other Topics Concern  . Not on file   Social History Narrative   Caffeine use:  1 pot coffee daily, 1 glass tea   Regular exercise:  No. Has active lifestyle.   2 biological and 1 stepson.   Former smoker- quit in 2007   He is on disability- reports that he has a history of heat stroke.               Past Surgical History  Procedure Laterality Date  . Hernia repair  2005    with mesh  . Nasal polyp excision  1992 or 93  . Carpal tunnel release    . Knee arthroscopy Bilateral 12/05/13    cyst and bone spur removed from left knee per pt.  . Colonoscopy    . Polypectomy      Family History  Problem Relation Age  of Onset  . Heart disease Mother     pacemaker  . Diabetes Father   . Kidney disease Father   . Diabetes Sister   . Kidney disease Sister   . Colon cancer Neg Hx   . Esophageal cancer Neg Hx   . Rectal cancer Neg Hx   . Stomach cancer Neg Hx   . Prostate cancer Neg Hx   . Pancreatic cancer Neg Hx     Allergies  Allergen Reactions  . Aspirin Anaphylaxis  . Ibuprofen Anaphylaxis  . Peanut Butter Flavor Anaphylaxis  . Cephalexin Rash    Current Outpatient Prescriptions on File Prior to Visit  Medication Sig Dispense Refill  . albuterol (PROAIR HFA) 108 (90 BASE) MCG/ACT inhaler Inhale 2 puffs into the lungs every 6 (six) hours as needed for wheezing. 25.5 g 1  . albuterol (PROVENTIL) (2.5 MG/3ML) 0.083% nebulizer solution Take 3 mLs (2.5 mg total) by nebulization every 6 (six) hours as needed. For shortness of breath and  wheezing 75 mL 1  . amLODipine-benazepril (LOTREL) 5-10 MG per capsule TAKE ONE CAPSULE BY MOUTH DAILY 90 capsule 0  . Fluticasone-Salmeterol (ADVAIR DISKUS) 250-50 MCG/DOSE AEPB Inhale 1 puff into the lungs 2 (two) times daily. 1 each 3  . furosemide (LASIX) 20 MG tablet Take 1 tablet (20 mg total) by mouth daily. 30 tablet 3  . levothyroxine (SYNTHROID, LEVOTHROID) 200 MCG tablet TAKE 1 TABLET BY MOUTH DAILY BEFORE BREAKFAST 30 tablet 0  . lovastatin (MEVACOR) 40 MG tablet TAKE 1 TABLET BY MOUTH EVERY NIGHT AT BEDTIME 90 tablet 1  . montelukast (SINGULAIR) 10 MG tablet TAKE 1 TABLET BY MOUTH EVERY NIGHT AT BEDTIME 90 tablet 0  . omeprazole (PRILOSEC) 40 MG capsule Take 40 mg by mouth daily.  1  . oxyCODONE-acetaminophen (PERCOCET) 10-325 MG per tablet Take 1 tablet by mouth daily.    . pantoprazole (PROTONIX) 40 MG tablet Take 1 tablet (40 mg total) by mouth daily. 30 tablet 4  . tamsulosin (FLOMAX) 0.4 MG CAPS capsule TAKE 1 CAPSULE BY MOUTH EVERY NIGHT AT BEDTIME 90 capsule 1   No current facility-administered medications on file prior to visit.    BP 133/94  mmHg  Pulse 85  Temp(Src) 98.1 F (36.7 C) (Oral)  Resp 16  Ht 5\' 5"  (1.651 m)  Wt 216 lb (97.977 kg)  BMI 35.94 kg/m2  SpO2 97%       Objective:   Physical Exam  Constitutional: He is oriented to person, place, and time. He appears well-developed and well-nourished. No distress.  HENT:  Head: Normocephalic.  Mouth/Throat: Posterior oropharyngeal erythema present. No posterior oropharyngeal edema.  Mild white exudate noted on uvula  Cardiovascular: Normal rate and regular rhythm.   No murmur heard. Pulmonary/Chest: Effort normal and breath sounds normal. No respiratory distress. He has no wheezes. He has no rales. He exhibits no tenderness.  Musculoskeletal: He exhibits no edema.  Neurological: He is alert and oriented to person, place, and time.  Skin: Skin is warm and dry.  Psychiatric: He has a normal mood and affect. His behavior is normal. Judgment and thought content normal.          Assessment & Plan:

## 2014-11-17 NOTE — Patient Instructions (Signed)
Add mucinex 600mg  twice daily. You can also take a hot steamy shower prior to bed at night to help break up the chest congestion.   Call if symptoms do not continue to improve.

## 2014-11-17 NOTE — Assessment & Plan Note (Signed)
Resolved, having some residual chest congestion.  Advised to continue inhalers and:  Add mucinex 600mg  twice daily. You can also take a hot steamy shower prior to bed at night to help break up the chest congestion.   Call if symptoms do not continue to improve.

## 2014-11-17 NOTE — Assessment & Plan Note (Signed)
Mild, rx with nystatin.

## 2014-11-17 NOTE — Progress Notes (Signed)
Pre visit review using our clinic review tool, if applicable. No additional management support is needed unless otherwise documented below in the visit note. 

## 2014-11-19 ENCOUNTER — Other Ambulatory Visit: Payer: Self-pay | Admitting: Family

## 2014-12-01 ENCOUNTER — Other Ambulatory Visit: Payer: Self-pay | Admitting: Family

## 2015-01-08 ENCOUNTER — Telehealth: Payer: Self-pay | Admitting: Family

## 2015-01-08 NOTE — Telephone Encounter (Signed)
Please advise 

## 2015-01-08 NOTE — Telephone Encounter (Signed)
Caller name: Masai Relation to pt: self Call back number: [phone] Pharmacy: [facility]  Reason for call:   Patient states that he has been smoking and would like melissa to prescribe chantix for him to help him quit.

## 2015-01-09 NOTE — Telephone Encounter (Signed)
Notified pt and spouse and scheduled appt for 01/15/15 at 10:30am.

## 2015-01-09 NOTE — Telephone Encounter (Signed)
I am happy to, but will need to see him in the office to discuss first.

## 2015-01-15 ENCOUNTER — Ambulatory Visit: Payer: Self-pay | Admitting: Family

## 2015-01-18 ENCOUNTER — Other Ambulatory Visit: Payer: Self-pay | Admitting: Family

## 2015-01-22 ENCOUNTER — Other Ambulatory Visit (HOSPITAL_BASED_OUTPATIENT_CLINIC_OR_DEPARTMENT_OTHER): Payer: Self-pay | Admitting: Orthopedic Surgery

## 2015-01-22 DIAGNOSIS — M25561 Pain in right knee: Secondary | ICD-10-CM

## 2015-01-22 DIAGNOSIS — M25562 Pain in left knee: Secondary | ICD-10-CM

## 2015-01-24 ENCOUNTER — Ambulatory Visit (HOSPITAL_BASED_OUTPATIENT_CLINIC_OR_DEPARTMENT_OTHER)
Admission: RE | Admit: 2015-01-24 | Discharge: 2015-01-24 | Disposition: A | Discharge status: Home / Self Care | Payer: Medicare Other | Source: Ambulatory Visit | Attending: Orthopedic Surgery | Admitting: Orthopedic Surgery

## 2015-01-24 DIAGNOSIS — M25861 Other specified joint disorders, right knee: Secondary | ICD-10-CM | POA: Insufficient documentation

## 2015-01-24 DIAGNOSIS — M25862 Other specified joint disorders, left knee: Secondary | ICD-10-CM | POA: Insufficient documentation

## 2015-01-24 DIAGNOSIS — M25561 Pain in right knee: Secondary | ICD-10-CM

## 2015-01-24 DIAGNOSIS — M170 Bilateral primary osteoarthritis of knee: Secondary | ICD-10-CM | POA: Diagnosis not present

## 2015-01-24 DIAGNOSIS — M25562 Pain in left knee: Secondary | ICD-10-CM

## 2015-01-26 HISTORY — PX: KNEE ARTHROSCOPY: SHX127

## 2015-01-27 ENCOUNTER — Telehealth: Payer: Self-pay | Admitting: *Deleted

## 2015-01-27 MED ORDER — AMLODIPINE BESY-BENAZEPRIL HCL 5-10 MG PO CAPS: 1.0000 | ORAL_CAPSULE | Freq: Every day | ORAL | Status: DC

## 2015-01-27 NOTE — Telephone Encounter (Signed)
Received fax from [facility] for amlodipine / benazepril 5/10mg , #90.  Refill sent.

## 2015-01-30 ENCOUNTER — Telehealth: Payer: Self-pay | Admitting: Family

## 2015-01-30 NOTE — Telephone Encounter (Signed)
Caller name: michelle Relation to pt: wife Call back number: [phone] Pharmacy: [facility]  Reason for call:   Wanting to try to quit smoking. Wants to try chantix again.

## 2015-01-31 MED ORDER — VARENICLINE TARTRATE 0.5 MG X 11 & 1 MG X 42 PO MISC: ORAL | Status: DC

## 2015-01-31 NOTE — Telephone Encounter (Signed)
Please fax starting month pak.

## 2015-02-02 NOTE — Telephone Encounter (Signed)
Med refill Varenicline (Chantix) was faxed to pharmacy.

## 2015-02-03 ENCOUNTER — Telehealth: Payer: Self-pay | Admitting: Family

## 2015-02-03 NOTE — Telephone Encounter (Signed)
Spoke with Sam at [facility]. He states he never received Chantix Rx we faxed on 02/02/15. Gave verbal per medication order in EPIC. Pt not on xanax. Rx faxed on 02/02/15 was for Chantix.

## 2015-02-03 NOTE — Telephone Encounter (Signed)
Caller name:Terry Relation to HU:OHFG Call back [phone] [facility]  Reason for call: pharmacy states they never received the rx for xanax, pharmacy states pt informed them that Lenna Sciara was calling it in on yesterday, please send rx to pharmacy

## 2015-02-04 ENCOUNTER — Ambulatory Visit (INDEPENDENT_AMBULATORY_CARE_PROVIDER_SITE_OTHER): Payer: Medicare Other | Admitting: Family

## 2015-02-04 ENCOUNTER — Encounter: Payer: Self-pay | Admitting: Family

## 2015-02-04 VITALS — BP 128/84 | HR 71 | Temp 98.1°F | Resp 18 | Ht 65.0 in | Wt 215.4 lb

## 2015-02-04 DIAGNOSIS — J4520 Mild intermittent asthma, uncomplicated: Secondary | ICD-10-CM

## 2015-02-04 DIAGNOSIS — E785 Hyperlipidemia, unspecified: Secondary | ICD-10-CM

## 2015-02-04 DIAGNOSIS — I10 Essential (primary) hypertension: Secondary | ICD-10-CM

## 2015-02-04 DIAGNOSIS — E039 Hypothyroidism, unspecified: Secondary | ICD-10-CM

## 2015-02-04 NOTE — Assessment & Plan Note (Signed)
Tolerating statin, obtain lipid panel.  

## 2015-02-04 NOTE — Assessment & Plan Note (Signed)
BP stable on current meds, continue same.  

## 2015-02-04 NOTE — Progress Notes (Signed)
Subjective:    Patient ID: Kyle Blair, male    DOB: 10/05/1961, 54 y.o.   MRN: [id number]  HPI Mr. Marsico is here today for follow up.  1. HYPERTENSION: Reports compliance with lotrel. Denies chest pain, dyspnea, or ankle/foot edema. BP Readings from Last 3 Encounters:  02/04/15 128/84  11/17/14 133/94  11/07/14 122/80   2. Asthma: reports breathing is good. Compliant with meds.   3. Hypothyroidism. Compliant with levothyroxine. Reports some constipation. Denies fatigue. Lab Results  Component Value Date   TSH 24.123* 08/19/2014    4. Hyperlipidemia: Reports compliance with lovastatin. Denies myalgia. Diet: reports eating what he wants. Does not avoid fat. Exercise: unable to exercise due to knee pain.  Lab Results  Component Value Date   CHOL 168 12/31/2012   HDL 46 12/31/2012   LDLCALC 96 12/31/2012   TRIG 131 12/31/2012   CHOLHDL 3.7 12/31/2012   5. Tobacco use: Has begun smoking again about 1 pack every 3 days. Daughter has lost 2 babies this year and he has had a lot of stress. Plans on beginning Chantix that was called in for him.  6. GERD: Reports heartburn/reflux 1-2 x per week.  Reports taking omeprazole daily.  Uses Tums if needed.  Review of Systems See HPI  Past Medical History  Diagnosis Date  . Asthma   . Polycystic kidney disease   . Hypothyroid   . GERD with stricture   . BPH (benign prostatic hypertrophy)   . Hyperlipidemia   . Lung nodule 02/03/2012  . History of stroke 2004  . Stroke   . Arthritis     History   Social History  . Marital Status: Married    Spouse Name: N/A  . Number of Children: 2  . Years of Education: N/A   Occupational History  . DISABLED    Social History Main Topics  . Smoking status: Former Smoker -- 1.00 packs/day for 20 years    Types: Cigarettes    Quit date: 12/26/2004  . Smokeless tobacco: Never Used  . Alcohol Use: Yes     Comment: rarely  . Drug Use: No  . Sexual Activity: Not on file    Other Topics Concern  . Not on file   Social History Narrative   Caffeine use:  1 pot coffee daily, 1 glass tea   Regular exercise:  No. Has active lifestyle.   2 biological and 1 stepson.   Former smoker- quit in 2007   He is on disability- reports that he has a history of heat stroke.               Past Surgical History  Procedure Laterality Date  . Hernia repair  2005    with mesh  . Nasal polyp excision  1992 or 93  . Carpal tunnel release    . Knee arthroscopy Bilateral 12/05/13    cyst and bone spur removed from left knee per pt.  . Colonoscopy    . Polypectomy      Family History  Problem Relation Age of Onset  . Heart disease Mother     pacemaker  . Diabetes Father   . Kidney disease Father   . Diabetes Sister   . Kidney disease Sister   . Colon cancer Neg Hx   . Esophageal cancer Neg Hx   . Rectal cancer Neg Hx   . Stomach cancer Neg Hx   . Prostate cancer Neg Hx   . Pancreatic cancer Neg  Hx     Allergies  Allergen Reactions  . Aspirin Anaphylaxis  . Ibuprofen Anaphylaxis  . Peanut Butter Flavor Anaphylaxis  . Cephalexin Rash    Current Outpatient Prescriptions on File Prior to Visit  Medication Sig Dispense Refill  . ADVAIR DISKUS 250-50 MCG/DOSE AEPB INHALE 1 PUFF BY MOUTH TWICE DAILY 1 each 2  . albuterol (PROAIR HFA) 108 (90 BASE) MCG/ACT inhaler Inhale 2 puffs into the lungs every 6 (six) hours as needed for wheezing. 25.5 g 1  . albuterol (PROVENTIL) (2.5 MG/3ML) 0.083% nebulizer solution Take 3 mLs (2.5 mg total) by nebulization every 6 (six) hours as needed. For shortness of breath and wheezing 75 mL 1  . amLODipine-benazepril (LOTREL) 5-10 MG per capsule Take 1 capsule by mouth daily. 90 capsule 0  . furosemide (LASIX) 20 MG tablet Take 1 tablet (20 mg total) by mouth daily. 30 tablet 3  . levothyroxine (SYNTHROID, LEVOTHROID) 200 MCG tablet TAKE 1 TABLET BY MOUTH DAILY BEFORE BREAKFAST 30 tablet 3  . lovastatin (MEVACOR) 40 MG tablet  TAKE 1 TABLET BY MOUTH EVERY NIGHT AT BEDTIME 90 tablet 1  . montelukast (SINGULAIR) 10 MG tablet TAKE 1 TABLET BY MOUTH EVERY NIGHT AT BEDTIME 90 tablet 1  . nystatin (MYCOSTATIN) 100000 UNIT/ML suspension Take 5 mLs (500,000 Units total) by mouth 4 (four) times daily. 240 mL 0  . omeprazole (PRILOSEC) 40 MG capsule Take 40 mg by mouth daily.  1  . oxyCODONE-acetaminophen (PERCOCET) 10-325 MG per tablet Take 1 tablet by mouth daily.    . pantoprazole (PROTONIX) 40 MG tablet Take 1 tablet (40 mg total) by mouth daily. 30 tablet 4  . tamsulosin (FLOMAX) 0.4 MG CAPS capsule TAKE 1 CAPSULE BY MOUTH EVERY NIGHT AT BEDTIME 90 capsule 1  . varenicline (CHANTIX STARTING MONTH PAK) 0.5 MG X 11 & 1 MG X 42 tablet Take one 0.5 mg tablet by mouth once daily for 3 days, then increase to one 0.5 mg tablet twice daily for 4 days, then increase to one 1 mg tablet twice daily. 53 tablet 0   No current facility-administered medications on file prior to visit.    BP 128/84 mmHg  Pulse 71  Temp(Src) 98.1 F (36.7 C) (Oral)  Resp 18  Ht 5\' 5"  (1.651 m)  Wt 215 lb 6 oz (97.693 kg)  BMI 35.84 kg/m2  SpO2 96%       Objective:   Physical Exam  Constitutional: He is oriented to person, place, and time. He appears well-developed and well-nourished. No distress.  HENT:  Head: Normocephalic and atraumatic.  Cardiovascular: Normal rate and regular rhythm.   No murmur heard. Pulmonary/Chest: Effort normal and breath sounds normal. No respiratory distress. He has no wheezes. He has no rales. He exhibits no tenderness.  Musculoskeletal: He exhibits no edema.  Neurological: He is alert and oriented to person, place, and time.  Skin: Skin is warm and dry.  Psychiatric: He has a normal mood and affect. His behavior is normal. Judgment and thought content normal.          Assessment & Plan:

## 2015-02-04 NOTE — Patient Instructions (Signed)
Please complete lab work prior to leaving. Work hard on quitting smoking. Follow up in 3 months.

## 2015-02-04 NOTE — Progress Notes (Signed)
Pre visit review using our clinic review tool, if applicable. No additional management support is needed unless otherwise documented below in the visit note. 

## 2015-02-04 NOTE — Assessment & Plan Note (Signed)
Stable on proair and advair, continue same.

## 2015-02-04 NOTE — Assessment & Plan Note (Signed)
Obtain follow up tsh.  

## 2015-02-05 ENCOUNTER — Other Ambulatory Visit: Payer: Medicare Other

## 2015-02-05 DIAGNOSIS — E039 Hypothyroidism, unspecified: Secondary | ICD-10-CM

## 2015-02-05 DIAGNOSIS — E038 Other specified hypothyroidism: Secondary | ICD-10-CM

## 2015-02-05 LAB — LIPID PANEL
CHOL/HDL RATIO: 3
Cholesterol: 183 mg/dL (ref 0–200)
HDL: 57.4 mg/dL (ref >39.00)
LDL Cholesterol: 105 mg/dL — ABNORMAL HIGH (ref 0–99)
NONHDL: 125.6
Triglycerides: 103 mg/dL (ref 0.0–149.0)
VLDL: 20.6 mg/dL (ref 0.0–40.0)

## 2015-02-05 LAB — BASIC METABOLIC PANEL
BUN: 15 mg/dL (ref 6–23)
CHLORIDE: 107 meq/L (ref 96–112)
CO2: 25 mEq/L (ref 19–32)
CREATININE: 0.81 mg/dL (ref 0.40–1.50)
Calcium: 9.6 mg/dL (ref 8.4–10.5)
GFR: 105.9 mL/min (ref >60.00)
Glucose, Bld: 128 mg/dL — ABNORMAL HIGH (ref 70–99)
Potassium: 4 mEq/L (ref 3.5–5.1)
Sodium: 139 mEq/L (ref 135–145)

## 2015-02-05 LAB — TSH: TSH: 0.29 u[IU]/mL — ABNORMAL LOW (ref 0.35–4.50)

## 2015-02-05 NOTE — Addendum Note (Signed)
Addended by: Peggyann Shoals on: 02/05/2015 02:15 PM   Modules accepted: Orders

## 2015-02-08 ENCOUNTER — Other Ambulatory Visit: Payer: Self-pay | Admitting: Family

## 2015-02-08 DIAGNOSIS — R739 Hyperglycemia, unspecified: Secondary | ICD-10-CM

## 2015-02-08 DIAGNOSIS — E039 Hypothyroidism, unspecified: Secondary | ICD-10-CM

## 2015-02-08 NOTE — Telephone Encounter (Signed)
Sugar is high.   Need a1C, dx hyperglycemia. Can lab add on?  Pt should work on avoiding concentrated sweets, and limiting white carbs (rice/bread/pasta/potatoes). Instead substitute whole grain versions with reasonable portions.   Synthroid needs to be decreased from 259mcg to 188 mcg.  I would like him to take 154mcg + 88 mcg once daily. Repeat tsh in 6 weeks.

## 2015-02-09 ENCOUNTER — Telehealth: Payer: Self-pay | Admitting: *Deleted

## 2015-02-09 MED ORDER — FUROSEMIDE 20 MG PO TABS: 20.0000 mg | ORAL_TABLET | Freq: Every day | ORAL | Status: DC

## 2015-02-09 MED ORDER — LEVOTHYROXINE SODIUM 100 MCG PO TABS: 100.0000 ug | ORAL_TABLET | Freq: Every day | ORAL | Status: DC

## 2015-02-09 MED ORDER — LEVOTHYROXINE SODIUM 88 MCG PO TABS: 88.0000 ug | ORAL_TABLET | Freq: Every day | ORAL | Status: DC

## 2015-02-09 NOTE — Telephone Encounter (Signed)
Received fax refill from [facility] for furosemide 20mg .

## 2015-02-09 NOTE — Telephone Encounter (Signed)
Notified pt and he voices understanding. Rxs sent to [facility] per pt request. Unable to add hgb a1c to previous labs and scheduled pt lab visit for 02/12/15 at 1:30pm.  Also scheduled 6 wk tsh for 03/23/15 at 1:30pm. Future orders entered.

## 2015-02-12 ENCOUNTER — Other Ambulatory Visit: Payer: Medicare Other

## 2015-02-17 ENCOUNTER — Ambulatory Visit: Payer: Medicare Other | Admitting: Family

## 2015-02-25 ENCOUNTER — Telehealth: Payer: Self-pay | Admitting: *Deleted

## 2015-02-25 NOTE — Telephone Encounter (Signed)
Prior authorization for amlodipine initiated. Awaiting determination. JG//CMA

## 2015-03-14 ENCOUNTER — Other Ambulatory Visit: Payer: Self-pay | Admitting: Family

## 2015-03-16 ENCOUNTER — Telehealth: Payer: Self-pay | Admitting: Family

## 2015-03-16 MED ORDER — AMLODIPINE BESYLATE 5 MG PO TABS: 5.0000 mg | ORAL_TABLET | Freq: Every day | ORAL | Status: DC

## 2015-03-16 MED ORDER — BENAZEPRIL HCL 10 MG PO TABS: 10.0000 mg | ORAL_TABLET | Freq: Every day | ORAL | Status: DC

## 2015-03-16 NOTE — Telephone Encounter (Signed)
Caller name:Roshaun Chavana Relationship to patient:self Can be [phone] Pharmacy:[facility] in [place] [address]  Reason for call:Insurance no longer covers BP meds

## 2015-03-16 NOTE — Telephone Encounter (Signed)
Currently on lotrel 5/10mg .  Please advise?

## 2015-03-16 NOTE — Telephone Encounter (Signed)
Left detailed message on voicemail re: below instructions and to call if any questions or if individual meds not covered by insurance.

## 2015-03-16 NOTE — Telephone Encounter (Signed)
D/c lotrel, instead I broke it into amlodipine and benazapril, hopefully his insurance will cover the separate meds.

## 2015-03-16 NOTE — Telephone Encounter (Signed)
Rx request to pharmacy/[facility]  

## 2015-03-23 ENCOUNTER — Other Ambulatory Visit: Payer: Medicare Other

## 2015-03-24 ENCOUNTER — Other Ambulatory Visit: Payer: Self-pay | Admitting: *Deleted

## 2015-03-24 MED ORDER — FLUTICASONE-SALMETEROL 250-50 MCG/DOSE IN AEPB: 1.0000 | INHALATION_SPRAY | Freq: Two times a day (BID) | RESPIRATORY_TRACT | Status: DC

## 2015-03-24 NOTE — Telephone Encounter (Signed)
Rx request to pharmacy/[facility]  

## 2015-04-02 ENCOUNTER — Other Ambulatory Visit: Payer: Self-pay | Admitting: Family

## 2015-04-10 ENCOUNTER — Other Ambulatory Visit: Payer: Self-pay | Admitting: Family

## 2015-04-10 NOTE — Telephone Encounter (Signed)
Received levothyroxine request from pharmacy.  Pt should still have refills at pharmacy. Pt was due for TSH level on 03/18/15 and did not complete. Please call pt to arrange lab appt as soon as possible. Thanks!

## 2015-04-13 ENCOUNTER — Other Ambulatory Visit (INDEPENDENT_AMBULATORY_CARE_PROVIDER_SITE_OTHER): Payer: Medicare Other

## 2015-04-13 DIAGNOSIS — E039 Hypothyroidism, unspecified: Secondary | ICD-10-CM | POA: Diagnosis not present

## 2015-04-13 DIAGNOSIS — R739 Hyperglycemia, unspecified: Secondary | ICD-10-CM

## 2015-04-13 LAB — TSH: TSH: 0.31 u[IU]/mL — AB (ref 0.35–4.50)

## 2015-04-13 LAB — HEMOGLOBIN A1C: HEMOGLOBIN A1C: 5.4 % (ref 4.6–6.5)

## 2015-04-15 ENCOUNTER — Ambulatory Visit (INDEPENDENT_AMBULATORY_CARE_PROVIDER_SITE_OTHER): Payer: Medicare Other | Admitting: Family

## 2015-04-15 ENCOUNTER — Ambulatory Visit (HOSPITAL_BASED_OUTPATIENT_CLINIC_OR_DEPARTMENT_OTHER)
Admission: RE | Admit: 2015-04-15 | Discharge: 2015-04-15 | Disposition: A | Discharge status: Home / Self Care | Payer: Medicare Other | Source: Ambulatory Visit | Attending: Family | Admitting: Family

## 2015-04-15 ENCOUNTER — Encounter: Payer: Self-pay | Admitting: Family

## 2015-04-15 VITALS — BP 120/88 | HR 76 | Temp 98.4°F | Resp 16 | Ht 65.0 in | Wt 222.4 lb

## 2015-04-15 DIAGNOSIS — I10 Essential (primary) hypertension: Secondary | ICD-10-CM | POA: Diagnosis not present

## 2015-04-15 DIAGNOSIS — J4520 Mild intermittent asthma, uncomplicated: Secondary | ICD-10-CM

## 2015-04-15 DIAGNOSIS — N400 Enlarged prostate without lower urinary tract symptoms: Secondary | ICD-10-CM

## 2015-04-15 DIAGNOSIS — K219 Gastro-esophageal reflux disease without esophagitis: Secondary | ICD-10-CM

## 2015-04-15 DIAGNOSIS — Z01818 Encounter for other preprocedural examination: Secondary | ICD-10-CM

## 2015-04-15 DIAGNOSIS — R010 Benign and innocent cardiac murmurs: Secondary | ICD-10-CM | POA: Diagnosis not present

## 2015-04-15 DIAGNOSIS — M1711 Unilateral primary osteoarthritis, right knee: Secondary | ICD-10-CM | POA: Insufficient documentation

## 2015-04-15 DIAGNOSIS — E039 Hypothyroidism, unspecified: Secondary | ICD-10-CM | POA: Diagnosis not present

## 2015-04-15 DIAGNOSIS — R011 Cardiac murmur, unspecified: Secondary | ICD-10-CM

## 2015-04-15 LAB — URINALYSIS, ROUTINE W REFLEX MICROSCOPIC
BILIRUBIN URINE: NEGATIVE
HGB URINE DIPSTICK: NEGATIVE
KETONES UR: NEGATIVE
Leukocytes, UA: NEGATIVE
NITRITE: NEGATIVE
RBC / HPF: NONE SEEN (ref 0–?)
Specific Gravity, Urine: <=1.005 — AB (ref 1.000–1.030)
TOTAL PROTEIN, URINE-UPE24: NEGATIVE
Urine Glucose: NEGATIVE
Urobilinogen, UA: 0.2 (ref 0.0–1.0)
pH: 6 (ref 5.0–8.0)

## 2015-04-15 LAB — BASIC METABOLIC PANEL
BUN: 19 mg/dL (ref 6–23)
CHLORIDE: 105 meq/L (ref 96–112)
CO2: 26 mEq/L (ref 19–32)
Calcium: 9.4 mg/dL (ref 8.4–10.5)
Creatinine, Ser: 0.75 mg/dL (ref 0.40–1.50)
GFR: 115.65 mL/min (ref >60.00)
GLUCOSE: 89 mg/dL (ref 70–99)
Potassium: 4.1 mEq/L (ref 3.5–5.1)
SODIUM: 138 meq/L (ref 135–145)

## 2015-04-15 LAB — CBC WITH DIFFERENTIAL/PLATELET
Basophils Absolute: 0 10*3/uL (ref 0.0–0.1)
Basophils Relative: 0.1 % (ref 0.0–3.0)
EOS PCT: 0.2 % (ref 0.0–5.0)
Eosinophils Absolute: 0 10*3/uL (ref 0.0–0.7)
HEMATOCRIT: 46.2 % (ref 39.0–52.0)
Hemoglobin: 16.1 g/dL (ref 13.0–17.0)
LYMPHS ABS: 1.4 10*3/uL (ref 0.7–4.0)
Lymphocytes Relative: 24.6 % (ref 12.0–46.0)
MCHC: 34.8 g/dL (ref 30.0–36.0)
MCV: 86.2 fl (ref 78.0–100.0)
Monocytes Absolute: 0.6 10*3/uL (ref 0.1–1.0)
Monocytes Relative: 9.9 % (ref 3.0–12.0)
Neutro Abs: 3.6 10*3/uL (ref 1.4–7.7)
Neutrophils Relative %: 65.2 % (ref 43.0–77.0)
PLATELETS: 176 10*3/uL (ref 150.0–400.0)
RBC: 5.36 Mil/uL (ref 4.22–5.81)
RDW: 12.9 % (ref 11.5–15.5)
WBC: 5.6 10*3/uL (ref 4.0–10.5)

## 2015-04-15 LAB — PROTIME-INR
INR: 0.9 ratio (ref 0.8–1.0)
PROTHROMBIN TIME: 10.5 s (ref 9.6–13.1)

## 2015-04-15 LAB — HEPATIC FUNCTION PANEL
ALK PHOS: 82 U/L (ref 39–117)
ALT: 25 U/L (ref 0–53)
AST: 24 U/L (ref 0–37)
Albumin: 4.3 g/dL (ref 3.5–5.2)
BILIRUBIN DIRECT: 0.1 mg/dL (ref 0.0–0.3)
TOTAL PROTEIN: 6.6 g/dL (ref 6.0–8.3)
Total Bilirubin: 0.5 mg/dL (ref 0.2–1.2)

## 2015-04-15 LAB — TSH: TSH: 0.35 u[IU]/mL (ref 0.35–4.50)

## 2015-04-15 LAB — APTT: aPTT: 29.5 s (ref 23.4–32.7)

## 2015-04-15 NOTE — Assessment & Plan Note (Signed)
Obtain routine lab work, cxr,ekg.

## 2015-04-15 NOTE — Assessment & Plan Note (Signed)
Stable, continue current meds 

## 2015-04-15 NOTE — Assessment & Plan Note (Signed)
New.  Will obtain 2d echo to further evaluate prior to surgical clearance.

## 2015-04-15 NOTE — Patient Instructions (Addendum)
Please complete lab work prior to leaving and complete chest x-ray on the first floor. You will be contacted about your echocardiogram.  Please schedule a follow up appointment in 3 months.

## 2015-04-15 NOTE — Progress Notes (Signed)
Subjective:    Patient ID: Kyle Blair, male    DOB: 1961/07/28, 54 y.o.   MRN: [id number]  HPI  Kyle Blair is a 54 yr old male who presents today for pre-operative exam.  He is planning a right TKA.  He has been following with pain management.  He has been receiving steroid injections and gel injections without improvement in his pain. Dr. Alvan Dame will be performing his surgery. Date has not been set yet.    HTN-  Maintained on amlodipine, lasix. Denies CP/SOB or swelling. BP Readings from Last 3 Encounters:  04/15/15 120/88  02/04/15 128/84  11/17/14 133/94   Asthma-  Maintained on advair  singulair and prn albuterol. Reports + compliance with advair and singulair.  Reports that he uses his albuterol bid- just "a force of habit." but notes that his asthma symptoms are controlled.    BPH- maintained on flomax. Denies current urinary issues.   GERD- reports controlled on PPI but certain foods such as white rice will worsen symptoms so he avoids.   Review of Systems  Constitutional: Negative for unexpected weight change.  HENT: Negative for rhinorrhea.   Respiratory: Negative for cough and shortness of breath.   Cardiovascular: Negative for chest pain and leg swelling.  Gastrointestinal: Negative for diarrhea, constipation and blood in stool.  Genitourinary: Negative for dysuria and frequency.  Musculoskeletal: Positive for arthralgias.       Occasional knee swelling  Skin: Negative for rash.  Neurological: Negative for headaches.  Hematological: Negative for adenopathy.       Past Medical History  Diagnosis Date  . Asthma   . Polycystic kidney disease   . Hypothyroid   . GERD with stricture   . BPH (benign prostatic hypertrophy)   . Hyperlipidemia   . Lung nodule 02/03/2012  . History of stroke 2004  . Stroke   . Arthritis     History   Social History  . Marital Status: Married    Spouse Name: N/A  . Number of Children: 2  . Years of Education: N/A    Occupational History  . DISABLED    Social History Main Topics  . Smoking status: Former Smoker -- 1.00 packs/day for 20 years    Types: Cigarettes    Quit date: 03/15/2015  . Smokeless tobacco: Never Used  . Alcohol Use: 0.0 oz/week    0 Standard drinks or equivalent per week     Comment: rarely  . Drug Use: No  . Sexual Activity: Not on file   Other Topics Concern  . Not on file   Social History Narrative   Caffeine use:  1 pot coffee daily, 1 glass tea   Regular exercise:  No. Has active lifestyle.   2 biological and 1 stepson.   Former smoker- quit in 2007   He is on disability- reports that he has a history of heat stroke.               Past Surgical History  Procedure Laterality Date  . Hernia repair  2005    with mesh  . Nasal polyp excision  1992 or 93  . Carpal tunnel release    . Knee arthroscopy Bilateral 12/05/13    cyst and bone spur removed from left knee per pt.  . Colonoscopy    . Polypectomy    . Knee arthroscopy Left 01/2015    Family History  Problem Relation Age of Onset  . Heart disease Mother  pacemaker  . Diabetes Father   . Kidney disease Father   . Diabetes Sister   . Kidney disease Sister   . Colon cancer Neg Hx   . Esophageal cancer Neg Hx   . Rectal cancer Neg Hx   . Stomach cancer Neg Hx   . Prostate cancer Neg Hx   . Pancreatic cancer Neg Hx     Allergies  Allergen Reactions  . Aspirin Anaphylaxis  . Ibuprofen Anaphylaxis  . Peanut Butter Flavor Anaphylaxis  . Cephalexin Rash    Current Outpatient Prescriptions on File Prior to Visit  Medication Sig Dispense Refill  . albuterol (PROAIR HFA) 108 (90 BASE) MCG/ACT inhaler Inhale 2 puffs into the lungs every 6 (six) hours as needed for wheezing. 25.5 g 1  . albuterol (PROVENTIL) (2.5 MG/3ML) 0.083% nebulizer solution Take 3 mLs (2.5 mg total) by nebulization every 6 (six) hours as needed. For shortness of breath and wheezing 75 mL 1  . amLODipine (NORVASC) 5 MG  tablet Take 1 tablet (5 mg total) by mouth daily. 30 tablet 2  . benazepril (LOTENSIN) 10 MG tablet Take 1 tablet (10 mg total) by mouth daily. 30 tablet 2  . Fluticasone-Salmeterol (ADVAIR DISKUS) 250-50 MCG/DOSE AEPB Inhale 1 puff into the lungs 2 (two) times daily. 1 each 2  . furosemide (LASIX) 20 MG tablet Take 1 tablet (20 mg total) by mouth daily. 30 tablet 3  . levothyroxine (SYNTHROID, LEVOTHROID) 100 MCG tablet Take 1 tablet (100 mcg total) by mouth daily. 30 tablet 2  . levothyroxine (SYNTHROID, LEVOTHROID) 88 MCG tablet Take 1 tablet (88 mcg total) by mouth daily. 30 tablet 2  . lovastatin (MEVACOR) 40 MG tablet Take 1 tablet (40 mg total) by mouth at bedtime. 90 tablet 0  . montelukast (SINGULAIR) 10 MG tablet TAKE 1 TABLET BY MOUTH EVERY NIGHT AT BEDTIME 90 tablet 1  . nystatin (MYCOSTATIN) 100000 UNIT/ML suspension Take 5 mLs (500,000 Units total) by mouth 4 (four) times daily. 240 mL 0  . omeprazole (PRILOSEC) 40 MG capsule TAKE ONE CAPSULE BY MOUTH EVERY DAY 90 capsule 0  . oxyCODONE-acetaminophen (PERCOCET) 10-325 MG per tablet Take 1 tablet by mouth daily.    . pantoprazole (PROTONIX) 40 MG tablet Take 1 tablet (40 mg total) by mouth daily. 30 tablet 4  . tamsulosin (FLOMAX) 0.4 MG CAPS capsule Take 1 capsule (0.4 mg total) by mouth at bedtime. 90 capsule 0   No current facility-administered medications on file prior to visit.    BP 120/88 mmHg  Pulse 76  Temp(Src) 98.4 F (36.9 C) (Oral)  Resp 16  Ht 5\' 5"  (1.651 m)  Wt 222 lb 6.4 oz (100.88 kg)  BMI 37.01 kg/m2  SpO2 97%    Objective:   Physical Exam  Constitutional: He is oriented to person, place, and time. He appears well-developed and well-nourished. No distress.  HENT:  Head: Normocephalic and atraumatic.  Cardiovascular: Normal rate and regular rhythm.   Murmur heard.  Crescendo systolic murmur is present with a grade of 1/6  Pulmonary/Chest: Effort normal and breath sounds normal. No respiratory  distress. He has no wheezes. He has no rales.  Musculoskeletal: He exhibits no edema.  Neurological: He is alert and oriented to person, place, and time.  Skin: Skin is warm and dry.  Psychiatric: He has a normal mood and affect. His behavior is normal. Thought content normal.          Assessment & Plan:

## 2015-04-15 NOTE — Assessment & Plan Note (Signed)
Stable on PPI, continue same.  

## 2015-04-15 NOTE — Assessment & Plan Note (Signed)
BP is stable on current meds, continue same.  

## 2015-04-15 NOTE — Assessment & Plan Note (Addendum)
Stable on flomax. Continue same.  

## 2015-04-15 NOTE — Progress Notes (Signed)
Pre visit review using our clinic review tool, if applicable. No additional management support is needed unless otherwise documented below in the visit note. 

## 2015-04-16 ENCOUNTER — Other Ambulatory Visit: Payer: Self-pay | Admitting: Family

## 2015-04-16 DIAGNOSIS — E039 Hypothyroidism, unspecified: Secondary | ICD-10-CM

## 2015-04-16 NOTE — Telephone Encounter (Signed)
Lab work shows synthroid dose a little too strong. Our records show he is taking 167mcg + 88 mcg.  D/c these tabs and start synthroid 111mcg once daily. Repeat tsh in 6 weeks, dx hypothyroid pls. CXR and other pre-op labs look good.

## 2015-04-17 ENCOUNTER — Telehealth: Payer: Self-pay | Admitting: *Deleted

## 2015-04-17 MED ORDER — LEVOTHYROXINE SODIUM 175 MCG PO TABS: 175.0000 ug | ORAL_TABLET | Freq: Every day | ORAL | Status: DC

## 2015-04-17 NOTE — Telephone Encounter (Signed)
Patient already had a surgical clearance appointment on 04/15/15

## 2015-04-17 NOTE — Telephone Encounter (Signed)
Notified pt's wife. She will have pt call us to schedule lab appt in 6 weeks. Future lab order entered.

## 2015-04-17 NOTE — Telephone Encounter (Signed)
Received surgical clearance form via fax from [place]. Pt has TKA-Medial & Lateral w/wo patella resurfacing surgery scheduled for 05/19/2015. Please call and schedule surgical clearance appt with Melissa. Thanks, JG//CMA

## 2015-04-22 ENCOUNTER — Ambulatory Visit (HOSPITAL_BASED_OUTPATIENT_CLINIC_OR_DEPARTMENT_OTHER)
Admission: RE | Admit: 2015-04-22 | Discharge: 2015-04-22 | Disposition: A | Discharge status: Home / Self Care | Payer: Medicare Other | Source: Ambulatory Visit | Attending: Family | Admitting: Family

## 2015-04-22 DIAGNOSIS — R010 Benign and innocent cardiac murmurs: Secondary | ICD-10-CM | POA: Insufficient documentation

## 2015-04-22 DIAGNOSIS — R011 Cardiac murmur, unspecified: Secondary | ICD-10-CM | POA: Diagnosis not present

## 2015-04-22 NOTE — Progress Notes (Signed)
Echocardiogram 2D Echocardiogram has been performed.  Sharline Lehane 04/22/2015, 1:30 PM

## 2015-04-22 NOTE — Telephone Encounter (Signed)
Form forwarded to Provider for signature.

## 2015-04-27 NOTE — Telephone Encounter (Signed)
Clearance form has been signed.  Please fax and notify patient. Thanks!

## 2015-04-27 NOTE — Telephone Encounter (Signed)
Form faxed successfully to [place]. Sent for scanning. JG//CMA

## 2015-05-05 ENCOUNTER — Ambulatory Visit: Payer: Medicare Other | Admitting: Family

## 2015-05-13 NOTE — Patient Instructions (Addendum)
Kyle Blair  05/13/2015   Your procedure is scheduled on:   05/19/2015    Report to [facility] Main  Entrance and follow signs to               [place] at     0700 AM.  Call this number if you have problems the morning of surgery [phone]   Remember: ONLY 1 PERSON MAY GO WITH YOU TO SHORT STAY TO GET  READY MORNING OF [name]  Do not eat food or drink liquids :After Midnight.     Take these medicines the morning of surgery with A SIP OF WATER:   Albuterol inhaler if needed and bring, Albuterol nebulizer if needed, Amlodipine ( Norvasc), Advair Diskus, Synthroi d, singuliar, Prilosec, Oxycodone if needed                                You may not have any metal on your body including hair pins and              piercings  Do not wear jewelry,, lotions, powders or perfumes, deodorant                       May shave face and neck   Do not bring valuables to the hospital. [employer].  Contacts, dentures or bridgework may not be worn into surgery.  Leave suitcase in the car. After surgery it may be brought to your room.       Special Instructions: coughing and deep breathing exercises, leg exercises               Please read over the following fact sheets you were given: _____________________________________________________________________             [facility] - Preparing for Surgery Before surgery, you can play an important role.  Because skin is not sterile, your skin needs to be as free of germs as possible.  You can reduce the number of germs on your skin by washing with CHG (chlorahexidine gluconate) soap before surgery.  CHG is an antiseptic cleaner which kills germs and bonds with the skin to continue killing germs even after washing. Please DO NOT use if you have an allergy to CHG or antibacterial soaps.  If your skin becomes reddened/irritated stop using the CHG and inform your  nurse when you arrive at Short Stay. Do not shave (including legs and underarms) for at least 48 hours prior to the first CHG shower.  You may shave your face/neck. Please follow these instructions carefully:  1.  Shower with CHG Soap the night before surgery and the  morning of Surgery.  2.  If you choose to wash your hair, wash your hair first as usual with your  normal  shampoo.  3.  After you shampoo, rinse your hair and body thoroughly to remove the  shampoo.                           4.  Use CHG as you would any other liquid soap.  You can apply chg directly  to the skin and wash  Gently with a scrungie or clean washcloth.  5.  Apply the CHG Soap to your body ONLY FROM THE NECK DOWN.   Do not use on face/ open                           Wound or open sores. Avoid contact with eyes, ears mouth and genitals (private parts).                       Wash face,  Genitals (private parts) with your normal soap.             6.  Wash thoroughly, paying special attention to the area where your surgery  will be performed.  7.  Thoroughly rinse your body with warm water from the neck down.  8.  DO NOT shower/wash with your normal soap after using and rinsing off  the CHG Soap.                9.  Pat yourself dry with a clean towel.            10.  Wear clean pajamas.            11.  Place clean sheets on your bed the night of your first shower and do not  sleep with pets. Day of Surgery : Do not apply any lotions/deodorants the morning of surgery.  Please wear clean clothes to the hospital/surgery center.  FAILURE TO FOLLOW THESE INSTRUCTIONS MAY RESULT IN THE CANCELLATION OF YOUR SURGERY PATIENT SIGNATURE_________________________________  NURSE SIGNATURE__________________________________  ________________________________________________________________________  WHAT IS A BLOOD TRANSFUSION? Blood Transfusion Information  A transfusion is the replacement of blood or some of its  parts. Blood is made up of multiple cells which provide different functions.  Red blood cells carry oxygen and are used for blood loss replacement.  White blood cells fight against infection.  Platelets control bleeding.  Plasma helps clot blood.  Other blood products are available for specialized needs, such as hemophilia or other clotting disorders. BEFORE THE TRANSFUSION  Who gives blood for transfusions?   Healthy volunteers who are fully evaluated to make sure their blood is safe. This is blood bank blood. Transfusion therapy is the safest it has ever been in the practice of medicine. Before blood is taken from a donor, a complete history is taken to make sure that person has no history of diseases nor engages in risky social behavior (examples are intravenous drug use or sexual activity with multiple partners). The donor's travel history is screened to minimize risk of transmitting infections, such as malaria. The donated blood is tested for signs of infectious diseases, such as HIV and hepatitis. The blood is then tested to be sure it is compatible with you in order to minimize the chance of a transfusion reaction. If you or a relative donates blood, this is often done in anticipation of surgery and is not appropriate for emergency situations. It takes many days to process the donated blood. RISKS AND COMPLICATIONS Although transfusion therapy is very safe and saves many lives, the main dangers of transfusion include:  1. Getting an infectious disease. 2. Developing a transfusion reaction. This is an allergic reaction to something in the blood you were given. Every precaution is taken to prevent this. The decision to have a blood transfusion has been considered carefully by your caregiver before blood is given. Blood is not given unless the benefits outweigh  the risks. AFTER THE TRANSFUSION  Right after receiving a blood transfusion, you will usually feel much better and more energetic.  This is especially true if your red blood cells have gotten low (anemic). The transfusion raises the level of the red blood cells which carry oxygen, and this usually causes an energy increase.  The nurse administering the transfusion will monitor you carefully for complications. HOME CARE INSTRUCTIONS  No special instructions are needed after a transfusion. You may find your energy is better. Speak with your caregiver about any limitations on activity for underlying diseases you may have. SEEK MEDICAL CARE IF:   Your condition is not improving after your transfusion.  You develop redness or irritation at the intravenous (IV) site. SEEK IMMEDIATE MEDICAL CARE IF:  Any of the following symptoms occur over the next 12 hours:  Shaking chills.  You have a temperature by mouth above 102 F (38.9 C), not controlled by medicine.  Chest, back, or muscle pain.  People around you feel you are not acting correctly or are confused.  Shortness of breath or difficulty breathing.  Dizziness and fainting.  You get a rash or develop hives.  You have a decrease in urine output.  Your urine turns a dark color or changes to pink, red, or brown. Any of the following symptoms occur over the next 10 days:  You have a temperature by mouth above 102 F (38.9 C), not controlled by medicine.  Shortness of breath.  Weakness after normal activity.  The white part of the eye turns yellow (jaundice).  You have a decrease in the amount of urine or are urinating less often.  Your urine turns a dark color or changes to pink, red, or brown. Document Released: 12/09/2000 Document Revised: 03/05/2012 Document Reviewed: 07/28/2008 Exit[employer] Patient Information 2014 [place].  _______________________________________________________________________  Incentive Spirometer  An incentive spirometer is a tool that can help keep your lungs clear and active. This tool measures how well you are filling  your lungs with each breath. Taking long deep breaths may help reverse or decrease the chance of developing breathing (pulmonary) problems (especially infection) following:  A long period of time when you are unable to move or be active. BEFORE THE PROCEDURE   If the spirometer includes an indicator to show your best effort, your nurse or respiratory therapist will set it to a desired goal.  If possible, sit up straight or lean slightly forward. Try not to slouch.  Hold the incentive spirometer in an upright position. INSTRUCTIONS FOR USE  3. Sit on the edge of your bed if possible, or sit up as far as you can in bed or on a chair. 4. Hold the incentive spirometer in an upright position. 5. Breathe out normally. 6. Place the mouthpiece in your mouth and seal your lips tightly around it. 7. Breathe in slowly and as deeply as possible, raising the piston or the ball toward the top of the column. 8. Hold your breath for 3-5 seconds or for as long as possible. Allow the piston or ball to fall to the bottom of the column. 9. Remove the mouthpiece from your mouth and breathe out normally. 10. Rest for a few seconds and repeat Steps 1 through 7 at least 10 times every 1-2 hours when you are awake. Take your time and take a few normal breaths between deep breaths. 11. The spirometer may include an indicator to show your best effort. Use the indicator as a goal to work  toward during each repetition. 12. After each set of 10 deep breaths, practice coughing to be sure your lungs are clear. If you have an incision (the cut made at the time of surgery), support your incision when coughing by placing a pillow or rolled up towels firmly against it. Once you are able to get out of bed, walk around indoors and cough well. You may stop using the incentive spirometer when instructed by your caregiver.  RISKS AND COMPLICATIONS  Take your time so you do not get dizzy or light-headed.  If you are in pain, you  may need to take or ask for pain medication before doing incentive spirometry. It is harder to take a deep breath if you are having pain. AFTER USE  Rest and breathe slowly and easily.  It can be helpful to keep track of a log of your progress. Your caregiver can provide you with a simple table to help with this. If you are using the spirometer at home, follow these instructions: [place] IF:   You are having difficultly using the spirometer.  You have trouble using the spirometer as often as instructed.  Your pain medication is not giving enough relief while using the spirometer.  You develop fever of 100.5 F (38.1 C) or higher. SEEK IMMEDIATE MEDICAL CARE IF:   You cough up bloody sputum that had not been present before.  You develop fever of 102 F (38.9 C) or greater.  You develop worsening pain at or near the incision site. MAKE SURE YOU:   Understand these instructions.  Will watch your condition.  Will get help right away if you are not doing well or get worse. Document Released: 04/24/2007 Document Revised: 03/05/2012 Document Reviewed: 06/25/2007 [facility] Patient Information 2014 [place], [place].   ________________________________________________________________________

## 2015-05-14 ENCOUNTER — Encounter (HOSPITAL_COMMUNITY): Payer: Self-pay

## 2015-05-14 ENCOUNTER — Encounter (HOSPITAL_COMMUNITY)
Admission: RE | Admit: 2015-05-14 | Discharge: 2015-05-14 | Disposition: A | Discharge status: Home / Self Care | Payer: Medicare Other | Source: Ambulatory Visit | Attending: Orthopedic Surgery | Admitting: Orthopedic Surgery

## 2015-05-14 DIAGNOSIS — Z01812 Encounter for preprocedural laboratory examination: Secondary | ICD-10-CM | POA: Insufficient documentation

## 2015-05-14 HISTORY — DX: Essential (primary) hypertension: I10

## 2015-05-14 LAB — BASIC METABOLIC PANEL
Anion gap: 8 (ref 5–15)
BUN: 20 mg/dL (ref 6–20)
CHLORIDE: 105 mmol/L (ref 101–111)
CO2: 27 mmol/L (ref 22–32)
CREATININE: 0.67 mg/dL (ref 0.61–1.24)
Calcium: 9.4 mg/dL (ref 8.9–10.3)
GFR calc non Af Amer: >60 mL/min (ref >60)
Glucose, Bld: 97 mg/dL (ref 65–99)
Potassium: 3.8 mmol/L (ref 3.5–5.1)
Sodium: 140 mmol/L (ref 135–145)

## 2015-05-14 LAB — URINALYSIS, ROUTINE W REFLEX MICROSCOPIC
BILIRUBIN URINE: NEGATIVE
Glucose, UA: NEGATIVE mg/dL
Hgb urine dipstick: NEGATIVE
KETONES UR: NEGATIVE mg/dL
Leukocytes, UA: NEGATIVE
NITRITE: NEGATIVE
Protein, ur: NEGATIVE mg/dL
SPECIFIC GRAVITY, URINE: 1.013 (ref 1.005–1.030)
UROBILINOGEN UA: 0.2 mg/dL (ref 0.0–1.0)
pH: 7 (ref 5.0–8.0)

## 2015-05-14 LAB — CBC
HCT: 45.2 % (ref 39.0–52.0)
HEMOGLOBIN: 15.4 g/dL (ref 13.0–17.0)
MCH: 30 pg (ref 26.0–34.0)
MCHC: 34.1 g/dL (ref 30.0–36.0)
MCV: 88.1 fL (ref 78.0–100.0)
PLATELETS: 139 10*3/uL — AB (ref 150–400)
RBC: 5.13 MIL/uL (ref 4.22–5.81)
RDW: 13.1 % (ref 11.5–15.5)
WBC: 4.9 10*3/uL (ref 4.0–10.5)

## 2015-05-14 LAB — APTT: aPTT: 31 seconds (ref 24–37)

## 2015-05-14 LAB — PROTIME-INR
INR: 1 (ref 0.00–1.49)
Prothrombin Time: 13.4 seconds (ref 11.6–15.2)

## 2015-05-14 LAB — SURGICAL PCR SCREEN
MRSA, PCR: NEGATIVE
STAPHYLOCOCCUS AUREUS: NEGATIVE

## 2015-05-14 LAB — ABO/RH: ABO/RH(D): O POS

## 2015-05-14 NOTE — Progress Notes (Signed)
   05/14/15 1025  OBSTRUCTIVE SLEEP APNEA  Have you ever been diagnosed with sleep apnea through a sleep study? No  Do you snore loudly (loud enough to be heard through closed doors)?  1  Do you often feel tired, fatigued, or sleepy during the daytime? 0  Has anyone observed you stop breathing during your sleep? 0  Do you have, or are you being treated for high blood pressure? 1  BMI more than 35 kg/m2? 1  Age over 54 years old? 1  Neck circumference greater than 40 cm/16 inches? 1  Gender: 1  Obstructive Sleep Apnea Score 6

## 2015-05-14 NOTE — Progress Notes (Signed)
EKG 04-15-15 EP[facility] Echo 04-22-15 EP[facility] CXR 04-15-15 EPIC LOV 09-04-14 [place] [facility] on chart Surgical Clearance 04-17-15 Telephone note EPIC 04-15-15 Preop OV note -NP- EPIC

## 2015-05-15 NOTE — H&P (Addendum)
TOTAL KNEE ADMISSION H&P  Patient is being admitted for right total knee arthroplasty and cortisone injection in the left knee.  Subjective:  Chief Complaint:     Right knee primary OA / pain.    Left knee primary OA / pain  HPI: Kyle Blair, 54 y.o. male, has a history of pain and functional disability in the right knee due to arthritis and has failed non-surgical conservative treatments for greater than 12 weeks to include NSAID's and/or analgesics, corticosteriod injections and activity modification.  Onset of symptoms was gradual, starting  years ago with gradually worsening course since that time. The patient noted no past surgery on the right knee(s).  Patient currently rates pain in the bilaterally knee(s) at 9 out of 10 with activity. Patient has worsening of pain with activity and weight bearing, pain that interferes with activities of daily living, pain with passive range of motion, crepitus and joint swelling.  Patient has evidence of periarticular osteophytes and joint space narrowing by imaging studies.  Do to the OA and pain in the left knee it is felt that he would benefit from a cortisone injection in the left knee to help that knee as the right knee recovers form surgery.   There is no active infection.  Risks, benefits and expectations were discussed with the patient.  Risks including but not limited to the risk of anesthesia, blood clots, nerve damage, blood vessel damage, failure of the prosthesis, infection and up to and including death.  Patient understand the risks, benefits and expectations and wishes to proceed with surgery.   PCP: Nance Pear., NP  D/C Plans:      Home with HHPT  Post-op Meds:       No Rx given  Tranexamic Acid:      To be given - topically (previous stroke)  Decadron:      Is to be given  FYI:     ASA post-op  Norco post-op    Patient Active Problem List   Diagnosis Date Noted  . Preoperative evaluation to rule out surgical  contraindication 04/15/2015  . Undiagnosed cardiac murmurs 04/15/2015  . OA (osteoarthritis) of knee 09/13/2013  . Hx of adenomatous colonic polyps 05/08/2013  . Barrett's esophagus 05/03/2013  . Chronic diarrhea 04/02/2013  . HTN (hypertension) 04/02/2013  . Hx of low back pain 09/26/2012  . Knee joint pain, right 05/02/2012  . Lung nodule 02/03/2012  . Asthma, chronic 12/13/2011  . BPH (benign prostatic hypertrophy) 12/13/2011  . Hyperlipidemia 12/13/2011  . GERD (gastroesophageal reflux disease) 12/13/2011  . Hypothyroid   . Polycystic kidney disease    Past Medical History  Diagnosis Date  . Asthma   . Polycystic kidney disease   . Hypothyroid   . GERD with stricture   . BPH (benign prostatic hypertrophy)   . Hyperlipidemia   . Lung nodule 02/03/2012  . History of stroke 2004  . Stroke   . Arthritis   . Hypertension     Past Surgical History  Procedure Laterality Date  . Hernia repair  2005    with mesh  . Nasal polyp excision  1992 or 93  . Carpal tunnel release    . Knee arthroscopy Bilateral 12/05/13    cyst and bone spur removed from left knee per pt.  . Colonoscopy    . Polypectomy    . Knee arthroscopy Left 01/2015    No prescriptions prior to admission   Allergies  Allergen Reactions  . Aspirin Anaphylaxis  .  Ibuprofen Anaphylaxis  . Peanut Butter Flavor Anaphylaxis  . Cephalexin Rash    History  Substance Use Topics  . Smoking status: Former Smoker -- 1.00 packs/day for 20 years    Types: Cigarettes    Quit date: 03/15/2015  . Smokeless tobacco: Never Used  . Alcohol Use: 0.0 oz/week    0 Standard drinks or equivalent per week     Comment: rarely    Family History  Problem Relation Age of Onset  . Heart disease Mother     pacemaker  . Diabetes Father   . Kidney disease Father   . Diabetes Sister   . Kidney disease Sister   . Colon cancer Neg Hx   . Esophageal cancer Neg Hx   . Rectal cancer Neg Hx   . Stomach cancer Neg Hx   .  Prostate cancer Neg Hx   . Pancreatic cancer Neg Hx      Review of Systems  Constitutional: Negative.   HENT: Negative.   Eyes: Negative.   Respiratory: Negative.   Cardiovascular: Negative.   Gastrointestinal: Positive for heartburn.  Genitourinary: Negative.   Musculoskeletal: Positive for back pain and joint pain.  Skin: Negative.   Neurological: Negative.   Endo/Heme/Allergies: Negative.   Psychiatric/Behavioral: Negative.     Objective:  Physical Exam  Constitutional: He is oriented to person, place, and time. He appears well-developed and well-nourished.  HENT:  Head: Normocephalic.  Eyes: Pupils are equal, round, and reactive to light.  Neck: Neck supple. No JVD present. No tracheal deviation present. No thyromegaly present.  Cardiovascular: Normal rate and intact distal pulses.   Respiratory: Effort normal. No respiratory distress.  GI: Soft. There is no tenderness. There is no guarding.  Musculoskeletal:       Right knee: He exhibits decreased range of motion, swelling and bony tenderness. He exhibits no ecchymosis, no deformity, no laceration and no erythema. Tenderness found.  Lymphadenopathy:    He has no cervical adenopathy.  Neurological: He is alert and oriented to person, place, and time.  Skin: Skin is warm and dry.  Psychiatric: He has a normal mood and affect.     Labs:  Estimated body mass index is 37.01 kg/(m^2) as calculated from the following:   Height as of 04/15/15: 5\' 5"  (1.651 m).   Weight as of 04/15/15: 100.88 kg (222 lb 6.4 oz).   Imaging Review Plain radiographs demonstrate severe degenerative joint disease of the bilateral knee(s).The bone quality appears to be good for age and reported activity level.  Assessment/Plan:  End stage arthritis, bilateral knee   The patient history, physical examination, clinical judgment of the provider and imaging studies are consistent with end stage degenerative joint disease of the right knee(s) and  total knee arthroplasty is deemed medically necessary as well as a left knee cortisone injection. The treatment options including medical management, injection therapy arthroscopy and arthroplasty were discussed at length. The risks and benefits of total knee arthroplasty were presented and reviewed. The risks due to aseptic loosening, infection, stiffness, patella tracking problems, thromboembolic complications and other imponderables were discussed. The patient acknowledged the explanation, agreed to proceed with the plan and consent was signed. Patient is being admitted for inpatient treatment for surgery, pain control, PT, OT, prophylactic antibiotics, VTE prophylaxis, progressive ambulation and ADL's and discharge planning. The patient is planning to be discharged home with home health services.      Kyle Pugh Trumaine Wimer   PA-C  05/15/2015, 10:46 PM

## 2015-05-18 MED ORDER — VANCOMYCIN HCL 10 G IV SOLR
1500.0000 mg | INTRAVENOUS | Status: AC
  Administered 2015-05-19 (×2): 1500 mg via INTRAVENOUS
  Filled 2015-05-18 (×2): qty 1500

## 2015-05-19 ENCOUNTER — Encounter (HOSPITAL_COMMUNITY): Payer: Self-pay | Admitting: *Deleted

## 2015-05-19 ENCOUNTER — Inpatient Hospital Stay (HOSPITAL_COMMUNITY)
Admission: RE | Admit: 2015-05-19 | Discharge: 2015-05-21 | DRG: 470 | Disposition: A | Discharge status: Home / Self Care | Payer: Medicare Other | Source: Ambulatory Visit | Attending: Orthopedic Surgery | Admitting: Orthopedic Surgery

## 2015-05-19 ENCOUNTER — Encounter (HOSPITAL_COMMUNITY)
Admission: RE | Disposition: A | Discharge status: Home / Self Care | Payer: Self-pay | Source: Ambulatory Visit | Attending: Orthopedic Surgery

## 2015-05-19 ENCOUNTER — Inpatient Hospital Stay (HOSPITAL_COMMUNITY): Payer: Medicare Other | Admitting: Anesthesiology

## 2015-05-19 DIAGNOSIS — J45909 Unspecified asthma, uncomplicated: Secondary | ICD-10-CM | POA: Diagnosis present

## 2015-05-19 DIAGNOSIS — Z91018 Allergy to other foods: Secondary | ICD-10-CM

## 2015-05-19 DIAGNOSIS — E039 Hypothyroidism, unspecified: Secondary | ICD-10-CM | POA: Diagnosis present

## 2015-05-19 DIAGNOSIS — Z8673 Personal history of transient ischemic attack (TIA), and cerebral infarction without residual deficits: Secondary | ICD-10-CM

## 2015-05-19 DIAGNOSIS — E785 Hyperlipidemia, unspecified: Secondary | ICD-10-CM | POA: Diagnosis present

## 2015-05-19 DIAGNOSIS — Z91010 Allergy to peanuts: Secondary | ICD-10-CM

## 2015-05-19 DIAGNOSIS — Z96651 Presence of right artificial knee joint: Secondary | ICD-10-CM

## 2015-05-19 DIAGNOSIS — Z6836 Body mass index (BMI) 36.0-36.9, adult: Secondary | ICD-10-CM | POA: Diagnosis not present

## 2015-05-19 DIAGNOSIS — M65861 Other synovitis and tenosynovitis, right lower leg: Secondary | ICD-10-CM | POA: Diagnosis present

## 2015-05-19 DIAGNOSIS — Z886 Allergy status to analgesic agent status: Secondary | ICD-10-CM

## 2015-05-19 DIAGNOSIS — K219 Gastro-esophageal reflux disease without esophagitis: Secondary | ICD-10-CM | POA: Diagnosis present

## 2015-05-19 DIAGNOSIS — M25561 Pain in right knee: Secondary | ICD-10-CM | POA: Diagnosis present

## 2015-05-19 DIAGNOSIS — E669 Obesity, unspecified: Secondary | ICD-10-CM | POA: Diagnosis present

## 2015-05-19 DIAGNOSIS — N400 Enlarged prostate without lower urinary tract symptoms: Secondary | ICD-10-CM | POA: Diagnosis present

## 2015-05-19 DIAGNOSIS — Z87891 Personal history of nicotine dependence: Secondary | ICD-10-CM | POA: Diagnosis not present

## 2015-05-19 DIAGNOSIS — I10 Essential (primary) hypertension: Secondary | ICD-10-CM | POA: Diagnosis present

## 2015-05-19 DIAGNOSIS — M170 Bilateral primary osteoarthritis of knee: Secondary | ICD-10-CM | POA: Diagnosis present

## 2015-05-19 DIAGNOSIS — Z96659 Presence of unspecified artificial knee joint: Secondary | ICD-10-CM

## 2015-05-19 HISTORY — PX: TOTAL KNEE ARTHROPLASTY: SHX125

## 2015-05-19 LAB — TYPE AND SCREEN
ABO/RH(D): O POS
Antibody Screen: NEGATIVE

## 2015-05-19 SURGERY — ARTHROPLASTY, KNEE, TOTAL
Anesthesia: General | Site: Knee | Laterality: Right

## 2015-05-19 MED ORDER — ROPIVACAINE HCL 5 MG/ML IJ SOLN
INTRAMUSCULAR | Status: AC
  Filled 2015-05-19: qty 30

## 2015-05-19 MED ORDER — DOCUSATE SODIUM 100 MG PO CAPS: 100.0000 mg | ORAL_CAPSULE | Freq: Two times a day (BID) | ORAL | Status: DC

## 2015-05-19 MED ORDER — SUCCINYLCHOLINE CHLORIDE 20 MG/ML IJ SOLN
INTRAMUSCULAR | Status: DC | PRN
  Administered 2015-05-19: 100 mg via INTRAVENOUS

## 2015-05-19 MED ORDER — POLYETHYLENE GLYCOL 3350 17 G PO PACK: 17.0000 g | PACK | Freq: Two times a day (BID) | ORAL | Status: DC

## 2015-05-19 MED ORDER — DOCUSATE SODIUM 100 MG PO CAPS
100.0000 mg | ORAL_CAPSULE | Freq: Two times a day (BID) | ORAL | Status: DC
  Administered 2015-05-19 – 2015-05-21 (×4): 100 mg via ORAL

## 2015-05-19 MED ORDER — HYDROMORPHONE HCL 1 MG/ML IJ SOLN
0.5000 mg | INTRAMUSCULAR | Status: DC | PRN
  Administered 2015-05-19 – 2015-05-20 (×3): 1 mg via INTRAVENOUS
  Administered 2015-05-20: 2 mg via INTRAVENOUS
  Administered 2015-05-20 – 2015-05-21 (×5): 1 mg via INTRAVENOUS
  Filled 2015-05-19: qty 1
  Filled 2015-05-19: qty 2
  Filled 2015-05-19 (×7): qty 1

## 2015-05-19 MED ORDER — BUPIVACAINE-EPINEPHRINE (PF) 0.25% -1:200000 IJ SOLN
INTRAMUSCULAR | Status: AC
  Filled 2015-05-19: qty 30

## 2015-05-19 MED ORDER — PROPOFOL 10 MG/ML IV BOLUS
INTRAVENOUS | Status: AC
  Filled 2015-05-19: qty 20

## 2015-05-19 MED ORDER — MIDAZOLAM HCL 5 MG/5ML IJ SOLN
INTRAMUSCULAR | Status: DC | PRN
  Administered 2015-05-19 (×4): 1 mg via INTRAVENOUS

## 2015-05-19 MED ORDER — ONDANSETRON HCL 4 MG/2ML IJ SOLN: 4.0000 mg | Freq: Four times a day (QID) | INTRAMUSCULAR | Status: DC | PRN

## 2015-05-19 MED ORDER — SODIUM CHLORIDE 0.9 % IV SOLN
INTRAVENOUS | Status: DC
  Administered 2015-05-19 – 2015-05-20 (×2): via INTRAVENOUS
  Filled 2015-05-19 (×11): qty 1000

## 2015-05-19 MED ORDER — METHOCARBAMOL 500 MG PO TABS
500.0000 mg | ORAL_TABLET | Freq: Four times a day (QID) | ORAL | Status: DC | PRN
  Administered 2015-05-19 – 2015-05-21 (×6): 500 mg via ORAL
  Filled 2015-05-19 (×6): qty 1

## 2015-05-19 MED ORDER — FERROUS SULFATE 325 (65 FE) MG PO TABS
325.0000 mg | ORAL_TABLET | Freq: Three times a day (TID) | ORAL | Status: DC
  Administered 2015-05-20 – 2015-05-21 (×3): 325 mg via ORAL
  Filled 2015-05-19 (×7): qty 1

## 2015-05-19 MED ORDER — RIVAROXABAN 10 MG PO TABS: 10.0000 mg | ORAL_TABLET | ORAL | Status: DC

## 2015-05-19 MED ORDER — FERROUS SULFATE 325 (65 FE) MG PO TABS: 325.0000 mg | ORAL_TABLET | Freq: Three times a day (TID) | ORAL | Status: DC

## 2015-05-19 MED ORDER — CHLORHEXIDINE GLUCONATE 4 % EX LIQD: 60.0000 mL | Freq: Once | CUTANEOUS | Status: DC

## 2015-05-19 MED ORDER — OXYCODONE HCL 5 MG PO TABS: 5.0000 mg | ORAL_TABLET | ORAL | Status: DC | PRN

## 2015-05-19 MED ORDER — FENTANYL CITRATE (PF) 100 MCG/2ML IJ SOLN
INTRAMUSCULAR | Status: DC | PRN
  Administered 2015-05-19 (×2): 50 ug via INTRAVENOUS
  Administered 2015-05-19: 25 ug via INTRAVENOUS
  Administered 2015-05-19: 50 ug via INTRAVENOUS
  Administered 2015-05-19: 25 ug via INTRAVENOUS

## 2015-05-19 MED ORDER — FENTANYL CITRATE (PF) 100 MCG/2ML IJ SOLN
INTRAMUSCULAR | Status: AC
  Filled 2015-05-19: qty 2

## 2015-05-19 MED ORDER — KETOROLAC TROMETHAMINE 30 MG/ML IJ SOLN
INTRAMUSCULAR | Status: AC
  Filled 2015-05-19: qty 1

## 2015-05-19 MED ORDER — LACTATED RINGERS IV SOLN
INTRAVENOUS | Status: DC
  Administered 2015-05-19 (×3): via INTRAVENOUS

## 2015-05-19 MED ORDER — DEXAMETHASONE SODIUM PHOSPHATE 4 MG/ML IJ SOLN
INTRAMUSCULAR | Status: DC | PRN
  Administered 2015-05-19: 10 mg via INTRAVENOUS

## 2015-05-19 MED ORDER — METHYLPREDNISOLONE ACETATE 80 MG/ML IJ SUSP
INTRAMUSCULAR | Status: DC | PRN
  Administered 2015-05-19: 12:00:00 via INTRA_ARTICULAR

## 2015-05-19 MED ORDER — MIDAZOLAM HCL 2 MG/2ML IJ SOLN
INTRAMUSCULAR | Status: AC
  Filled 2015-05-19: qty 2

## 2015-05-19 MED ORDER — ROPIVACAINE HCL 5 MG/ML IJ SOLN
INTRAMUSCULAR | Status: DC | PRN
  Administered 2015-05-19: 30 mL via PERINEURAL

## 2015-05-19 MED ORDER — MENTHOL 3 MG MT LOZG: 1.0000 | LOZENGE | OROMUCOSAL | Status: DC | PRN

## 2015-05-19 MED ORDER — METOCLOPRAMIDE HCL 5 MG/ML IJ SOLN: 5.0000 mg | Freq: Three times a day (TID) | INTRAMUSCULAR | Status: DC | PRN

## 2015-05-19 MED ORDER — ONDANSETRON HCL 4 MG/2ML IJ SOLN
INTRAMUSCULAR | Status: AC
  Filled 2015-05-19: qty 2

## 2015-05-19 MED ORDER — LIDOCAINE HCL 1 % IJ SOLN
INTRAMUSCULAR | Status: AC
  Filled 2015-05-19: qty 20

## 2015-05-19 MED ORDER — POLYETHYLENE GLYCOL 3350 17 G PO PACK
17.0000 g | PACK | Freq: Two times a day (BID) | ORAL | Status: DC
  Administered 2015-05-19 – 2015-05-21 (×4): 17 g via ORAL

## 2015-05-19 MED ORDER — EPHEDRINE SULFATE 50 MG/ML IJ SOLN
INTRAMUSCULAR | Status: DC | PRN
  Administered 2015-05-19: 10 mg via INTRAVENOUS
  Administered 2015-05-19: 15 mg via INTRAVENOUS
  Administered 2015-05-19 (×5): 5 mg via INTRAVENOUS

## 2015-05-19 MED ORDER — VANCOMYCIN HCL IN DEXTROSE 1-5 GM/200ML-% IV SOLN
1000.0000 mg | Freq: Two times a day (BID) | INTRAVENOUS | Status: AC
  Administered 2015-05-19: 1000 mg via INTRAVENOUS
  Filled 2015-05-19: qty 200

## 2015-05-19 MED ORDER — ONDANSETRON HCL 4 MG PO TABS: 4.0000 mg | ORAL_TABLET | Freq: Four times a day (QID) | ORAL | Status: DC | PRN

## 2015-05-19 MED ORDER — ONDANSETRON HCL 4 MG/2ML IJ SOLN
INTRAMUSCULAR | Status: DC | PRN
  Administered 2015-05-19: 4 mg via INTRAVENOUS

## 2015-05-19 MED ORDER — METHOCARBAMOL 1000 MG/10ML IJ SOLN
500.0000 mg | Freq: Four times a day (QID) | INTRAVENOUS | Status: DC | PRN
  Administered 2015-05-19: 500 mg via INTRAVENOUS
  Filled 2015-05-19 (×2): qty 5

## 2015-05-19 MED ORDER — SODIUM CHLORIDE 0.9 % IJ SOLN
INTRAMUSCULAR | Status: DC | PRN
Start: 2015-05-19 — End: 2015-05-19
  Administered 2015-05-19: 30 mL

## 2015-05-19 MED ORDER — LACTATED RINGERS IV SOLN: INTRAVENOUS | Status: DC

## 2015-05-19 MED ORDER — AMLODIPINE BESYLATE 5 MG PO TABS
5.0000 mg | ORAL_TABLET | Freq: Every day | ORAL | Status: DC
  Administered 2015-05-20 – 2015-05-21 (×2): 5 mg via ORAL
  Filled 2015-05-19 (×2): qty 1

## 2015-05-19 MED ORDER — ACETAMINOPHEN 325 MG PO TABS: 325.0000 mg | ORAL_TABLET | Freq: Four times a day (QID) | ORAL | Status: DC | PRN

## 2015-05-19 MED ORDER — TRANEXAMIC ACID 1000 MG/10ML IV SOLN
2000.0000 mg | Freq: Once | INTRAVENOUS | Status: DC
  Filled 2015-05-19: qty 20

## 2015-05-19 MED ORDER — OMEPRAZOLE 20 MG PO CPDR
40.0000 mg | DELAYED_RELEASE_CAPSULE | Freq: Every day | ORAL | Status: DC
  Administered 2015-05-20 – 2015-05-21 (×2): 40 mg via ORAL
  Filled 2015-05-19 (×3): qty 2

## 2015-05-19 MED ORDER — METOCLOPRAMIDE HCL 10 MG PO TABS
5.0000 mg | ORAL_TABLET | Freq: Three times a day (TID) | ORAL | Status: DC | PRN
Start: 2015-05-19 — End: 2015-05-21

## 2015-05-19 MED ORDER — KETOROLAC TROMETHAMINE 30 MG/ML IJ SOLN
INTRAMUSCULAR | Status: DC | PRN
Start: 2015-05-19 — End: 2015-05-19
  Administered 2015-05-19: 30 mg via INTRAVENOUS

## 2015-05-19 MED ORDER — LEVOTHYROXINE SODIUM 175 MCG PO TABS
175.0000 ug | ORAL_TABLET | Freq: Every day | ORAL | Status: DC
  Administered 2015-05-20 – 2015-05-21 (×2): 175 ug via ORAL
  Filled 2015-05-19 (×3): qty 1

## 2015-05-19 MED ORDER — MAGNESIUM CITRATE PO SOLN: 1.0000 | Freq: Once | ORAL | Status: AC | PRN

## 2015-05-19 MED ORDER — PHENYLEPHRINE 40 MCG/ML (10ML) SYRINGE FOR IV PUSH (FOR BLOOD PRESSURE SUPPORT)
PREFILLED_SYRINGE | INTRAVENOUS | Status: AC
  Filled 2015-05-19: qty 10

## 2015-05-19 MED ORDER — DIPHENHYDRAMINE HCL 25 MG PO CAPS
25.0000 mg | ORAL_CAPSULE | Freq: Four times a day (QID) | ORAL | Status: DC | PRN
  Filled 2015-05-19: qty 1

## 2015-05-19 MED ORDER — PHENOL 1.4 % MT LIQD
1.0000 | OROMUCOSAL | Status: DC | PRN
  Filled 2015-05-19: qty 177

## 2015-05-19 MED ORDER — METOCLOPRAMIDE HCL 5 MG/ML IJ SOLN
INTRAMUSCULAR | Status: AC
  Filled 2015-05-19: qty 2

## 2015-05-19 MED ORDER — METHOCARBAMOL 500 MG PO TABS: 500.0000 mg | ORAL_TABLET | Freq: Four times a day (QID) | ORAL | Status: DC | PRN

## 2015-05-19 MED ORDER — ALBUTEROL SULFATE (2.5 MG/3ML) 0.083% IN NEBU
3.0000 mL | INHALATION_SOLUTION | Freq: Four times a day (QID) | RESPIRATORY_TRACT | Status: DC | PRN
  Administered 2015-05-19 – 2015-05-20 (×2): 3 mL via RESPIRATORY_TRACT
  Filled 2015-05-19 (×2): qty 3

## 2015-05-19 MED ORDER — PROPOFOL 10 MG/ML IV BOLUS
INTRAVENOUS | Status: DC | PRN
  Administered 2015-05-19: 200 mg via INTRAVENOUS
  Administered 2015-05-19: 50 mg via INTRAVENOUS

## 2015-05-19 MED ORDER — PHENYLEPHRINE HCL 10 MG/ML IJ SOLN
INTRAMUSCULAR | Status: DC | PRN
  Administered 2015-05-19: 80 ug via INTRAVENOUS
  Administered 2015-05-19 (×2): 120 ug via INTRAVENOUS
  Administered 2015-05-19: 80 ug via INTRAVENOUS

## 2015-05-19 MED ORDER — METHYLPREDNISOLONE ACETATE 40 MG/ML IJ SUSP
INTRAMUSCULAR | Status: AC
  Filled 2015-05-19: qty 2

## 2015-05-19 MED ORDER — ALUM & MAG HYDROXIDE-SIMETH 200-200-20 MG/5ML PO SUSP: 30.0000 mL | ORAL | Status: DC | PRN

## 2015-05-19 MED ORDER — HYDROMORPHONE HCL 1 MG/ML IJ SOLN
INTRAMUSCULAR | Status: AC
  Administered 2015-05-19: 1 mg via INTRAVENOUS
  Filled 2015-05-19: qty 2

## 2015-05-19 MED ORDER — BISACODYL 10 MG RE SUPP: 10.0000 mg | Freq: Every day | RECTAL | Status: DC | PRN

## 2015-05-19 MED ORDER — ALBUTEROL SULFATE (2.5 MG/3ML) 0.083% IN NEBU: 2.5000 mg | INHALATION_SOLUTION | Freq: Four times a day (QID) | RESPIRATORY_TRACT | Status: DC | PRN

## 2015-05-19 MED ORDER — MONTELUKAST SODIUM 10 MG PO TABS
10.0000 mg | ORAL_TABLET | Freq: Every morning | ORAL | Status: DC
  Administered 2015-05-20 – 2015-05-21 (×2): 10 mg via ORAL
  Filled 2015-05-19 (×2): qty 1

## 2015-05-19 MED ORDER — SODIUM CHLORIDE 0.9 % IR SOLN
Status: DC | PRN
  Administered 2015-05-19: 1000 mL

## 2015-05-19 MED ORDER — TAMSULOSIN HCL 0.4 MG PO CAPS
0.4000 mg | ORAL_CAPSULE | Freq: Every day | ORAL | Status: DC
  Administered 2015-05-19 – 2015-05-20 (×2): 0.4 mg via ORAL
  Filled 2015-05-19 (×3): qty 1

## 2015-05-19 MED ORDER — DEXAMETHASONE SODIUM PHOSPHATE 10 MG/ML IJ SOLN
10.0000 mg | Freq: Once | INTRAMUSCULAR | Status: AC
  Administered 2015-05-20: 10 mg via INTRAVENOUS
  Filled 2015-05-19 (×2): qty 1

## 2015-05-19 MED ORDER — OXYCODONE HCL 5 MG PO TABS
5.0000 mg | ORAL_TABLET | ORAL | Status: DC
  Administered 2015-05-19: 5 mg via ORAL
  Administered 2015-05-19 (×2): 15 mg via ORAL
  Administered 2015-05-19: 10 mg via ORAL
  Administered 2015-05-20 – 2015-05-21 (×9): 15 mg via ORAL
  Filled 2015-05-19: qty 3
  Filled 2015-05-19: qty 2
  Filled 2015-05-19: qty 3
  Filled 2015-05-19: qty 1
  Filled 2015-05-19 (×2): qty 3
  Filled 2015-05-19: qty 2
  Filled 2015-05-19 (×6): qty 3
  Filled 2015-05-19: qty 1

## 2015-05-19 MED ORDER — 0.9 % SODIUM CHLORIDE (POUR BTL) OPTIME
TOPICAL | Status: DC | PRN
  Administered 2015-05-19: 1000 mL

## 2015-05-19 MED ORDER — HYDROMORPHONE HCL 1 MG/ML IJ SOLN
0.2500 mg | INTRAMUSCULAR | Status: DC | PRN
  Administered 2015-05-19 (×3): 0.5 mg via INTRAVENOUS

## 2015-05-19 MED ORDER — METOCLOPRAMIDE HCL 5 MG/ML IJ SOLN
INTRAMUSCULAR | Status: DC | PRN
  Administered 2015-05-19: 10 mg via INTRAVENOUS

## 2015-05-19 MED ORDER — PRAVASTATIN SODIUM 40 MG PO TABS
40.0000 mg | ORAL_TABLET | Freq: Every day | ORAL | Status: DC
  Administered 2015-05-20: 40 mg via ORAL
  Filled 2015-05-19 (×2): qty 1

## 2015-05-19 MED ORDER — DEXAMETHASONE SODIUM PHOSPHATE 10 MG/ML IJ SOLN: 10.0000 mg | Freq: Once | INTRAMUSCULAR | Status: DC

## 2015-05-19 MED ORDER — FUROSEMIDE 20 MG PO TABS
20.0000 mg | ORAL_TABLET | ORAL | Status: DC
  Administered 2015-05-20: 20 mg via ORAL
  Filled 2015-05-19 (×2): qty 1

## 2015-05-19 MED ORDER — EPHEDRINE SULFATE 50 MG/ML IJ SOLN
INTRAMUSCULAR | Status: AC
  Filled 2015-05-19: qty 1

## 2015-05-19 MED ORDER — RIVAROXABAN 10 MG PO TABS
10.0000 mg | ORAL_TABLET | ORAL | Status: DC
Start: 2015-05-20 — End: 2015-05-21
  Administered 2015-05-20 – 2015-05-21 (×2): 10 mg via ORAL
  Filled 2015-05-19 (×3): qty 1

## 2015-05-19 MED ORDER — BUPIVACAINE-EPINEPHRINE (PF) 0.25% -1:200000 IJ SOLN
INTRAMUSCULAR | Status: DC | PRN
  Administered 2015-05-19: 27 mL

## 2015-05-19 MED ORDER — SODIUM CHLORIDE 0.9 % IJ SOLN
INTRAMUSCULAR | Status: AC
  Filled 2015-05-19: qty 50

## 2015-05-19 MED ORDER — MOMETASONE FURO-FORMOTEROL FUM 100-5 MCG/ACT IN AERO
2.0000 | INHALATION_SPRAY | Freq: Two times a day (BID) | RESPIRATORY_TRACT | Status: DC
  Administered 2015-05-19 – 2015-05-21 (×4): 2 via RESPIRATORY_TRACT
  Filled 2015-05-19: qty 8.8

## 2015-05-19 SURGICAL SUPPLY — 52 items
BAG DECANTER FOR FLEXI CONT (MISCELLANEOUS) IMPLANT
BAG ZIPLOCK 12X15 (MISCELLANEOUS) IMPLANT
BANDAGE ELASTIC 6 VELCRO ST LF (GAUZE/BANDAGES/DRESSINGS) ×2 IMPLANT
BANDAGE ESMARK 6X9 LF (GAUZE/BANDAGES/DRESSINGS) ×1 IMPLANT
BLADE SAW SGTL 13.0X1.19X90.0M (BLADE) ×2 IMPLANT
BNDG ESMARK 6X9 LF (GAUZE/BANDAGES/DRESSINGS) ×2
BOWL SMART MIX CTS (DISPOSABLE) ×2 IMPLANT
CAPT KNEE TOTAL 3 ATTUNE ×2 IMPLANT
CEMENT HV SMART SET (Cement) ×4 IMPLANT
CUFF TOURN SGL QUICK 34 (TOURNIQUET CUFF) ×1
CUFF TRNQT CYL 34X4X40X1 (TOURNIQUET CUFF) ×1 IMPLANT
DECANTER SPIKE VIAL GLASS SM (MISCELLANEOUS) ×2 IMPLANT
DRAPE EXTREMITY T 121X128X90 (DRAPE) ×2 IMPLANT
DRAPE POUCH INSTRU U-SHP 10X18 (DRAPES) ×2 IMPLANT
DRAPE U-SHAPE 47X51 STRL (DRAPES) ×2 IMPLANT
DRSG AQUACEL AG ADV 3.5X10 (GAUZE/BANDAGES/DRESSINGS) ×2 IMPLANT
DURAPREP 26ML APPLICATOR (WOUND CARE) ×4 IMPLANT
ELECT REM PT RETURN 9FT ADLT (ELECTROSURGICAL) ×2
ELECTRODE REM PT RTRN 9FT ADLT (ELECTROSURGICAL) ×1 IMPLANT
FACESHIELD WRAPAROUND (MASK) ×12 IMPLANT
GLOVE BIOGEL PI IND STRL 7.5 (GLOVE) ×1 IMPLANT
GLOVE BIOGEL PI IND STRL 8.5 (GLOVE) ×1 IMPLANT
GLOVE BIOGEL PI INDICATOR 7.5 (GLOVE) ×1
GLOVE BIOGEL PI INDICATOR 8.5 (GLOVE) ×1
GLOVE ECLIPSE 8.0 STRL XLNG CF (GLOVE) ×2 IMPLANT
GLOVE ORTHO TXT STRL SZ7.5 (GLOVE) ×4 IMPLANT
GOWN SPEC L3 XXLG W/TWL (GOWN DISPOSABLE) ×2 IMPLANT
GOWN STRL REUS W/TWL LRG LVL3 (GOWN DISPOSABLE) ×2 IMPLANT
HANDPIECE INTERPULSE COAX TIP (DISPOSABLE) ×1
KIT BASIN OR (CUSTOM PROCEDURE TRAY) ×2 IMPLANT
LIQUID BAND (GAUZE/BANDAGES/DRESSINGS) ×2 IMPLANT
MANIFOLD NEPTUNE II (INSTRUMENTS) ×2 IMPLANT
NDL SAFETY ECLIPSE 18X1.5 (NEEDLE) ×1 IMPLANT
NEEDLE HYPO 18GX1.5 SHARP (NEEDLE) ×1
PACK TOTAL JOINT (CUSTOM PROCEDURE TRAY) ×2 IMPLANT
PEN SKIN MARKING BROAD (MISCELLANEOUS) ×2 IMPLANT
POSITIONER SURGICAL ARM (MISCELLANEOUS) ×2 IMPLANT
SET HNDPC FAN SPRY TIP SCT (DISPOSABLE) ×1 IMPLANT
SET PAD KNEE POSITIONER (MISCELLANEOUS) ×2 IMPLANT
SUCTION FRAZIER 12FR DISP (SUCTIONS) ×2 IMPLANT
SUT MNCRL AB 4-0 PS2 18 (SUTURE) ×2 IMPLANT
SUT VIC AB 1 CT1 36 (SUTURE) ×2 IMPLANT
SUT VIC AB 2-0 CT1 27 (SUTURE) ×3
SUT VIC AB 2-0 CT1 TAPERPNT 27 (SUTURE) ×3 IMPLANT
SUT VLOC 180 0 24IN GS25 (SUTURE) ×2 IMPLANT
SYR 50ML LL SCALE MARK (SYRINGE) ×2 IMPLANT
TOWEL OR 17X26 10 PK STRL BLUE (TOWEL DISPOSABLE) ×2 IMPLANT
TOWEL OR NON WOVEN STRL DISP B (DISPOSABLE) IMPLANT
TRAY FOLEY CATH 16FRSI W/METER (SET/KITS/TRAYS/PACK) ×2 IMPLANT
WATER STERILE IRR 1500ML POUR (IV SOLUTION) ×2 IMPLANT
WRAP KNEE MAXI GEL POST OP (GAUZE/BANDAGES/DRESSINGS) ×2 IMPLANT
YANKAUER SUCT BULB TIP 10FT TU (MISCELLANEOUS) ×2 IMPLANT

## 2015-05-19 NOTE — Discharge Instructions (Addendum)
INSTRUCTIONS AFTER JOINT REPLACEMENT  ° °o Remove items at home which could result in a fall. This includes throw rugs or furniture in walking pathways °o ICE to the affected joint every three hours while awake for 30 minutes at a time, for at least the first 3-5 days, and then as needed for pain and swelling.  Continue to use ice for pain and swelling. You may notice swelling that will progress down to the foot and ankle.  This is normal after surgery.  Elevate your leg when you are not up walking on it.   °o Continue to use the breathing machine you got in the hospital (incentive spirometer) which will help keep your temperature down.  It is common for your temperature to cycle up and down following surgery, especially at night when you are not up moving around and exerting yourself.  The breathing machine keeps your lungs expanded and your temperature down. ° ° °DIET:  As you were doing prior to hospitalization, we recommend a well-balanced diet. ° °DRESSING / WOUND CARE / SHOWERING ° °Keep the surgical dressing until follow up.  The dressing is water proof, so you can shower without any extra covering.  IF THE DRESSING FALLS OFF or the wound gets wet inside, change the dressing with sterile gauze.  Please use good hand washing techniques before changing the dressing.  Do not use any lotions or creams on the incision until instructed by your surgeon.   ° °ACTIVITY ° °o Increase activity slowly as tolerated, but follow the weight bearing instructions below.   °o No driving for 6 weeks or until further direction given by your physician.  You cannot drive while taking narcotics.  °o No lifting or carrying greater than 10 lbs. until further directed by your surgeon. °o Avoid periods of inactivity such as sitting longer than an hour when not asleep. This helps prevent blood clots.  °o You may return to work once you are authorized by your doctor.  ° ° ° °WEIGHT BEARING  ° °Weight bearing as tolerated with assist  device (walker, cane, etc) as directed, use it as long as suggested by your surgeon or therapist, typically at least 4-6 weeks. ° ° °EXERCISES ° °Results after joint replacement surgery are often greatly improved when you follow the exercise, range of motion and muscle strengthening exercises prescribed by your doctor. Safety measures are also important to protect the joint from further injury. Any time any of these exercises cause you to have increased pain or swelling, decrease what you are doing until you are comfortable again and then slowly increase them. If you have problems or questions, call your caregiver or physical therapist for advice.  ° °Rehabilitation is important following a joint replacement. After just a few days of immobilization, the muscles of the leg can become weakened and shrink (atrophy).  These exercises are designed to build up the tone and strength of the thigh and leg muscles and to improve motion. Often times heat used for twenty to thirty minutes before working out will loosen up your tissues and help with improving the range of motion but do not use heat for the first two weeks following surgery (sometimes heat can increase post-operative swelling).  ° °These exercises can be done on a training (exercise) mat, on the floor, on a table or on a bed. Use whatever works the best and is most comfortable for you.    Use music or television while you are exercising so that   the exercises are a pleasant break in your day. This will make your life better with the exercises acting as a break in your routine that you can look forward to.   Perform all exercises about fifteen times, three times per day or as directed.  You should exercise both the operative leg and the other leg as well. ° °Exercises include: °  °• Quad Sets - Tighten up the muscle on the front of the thigh (Quad) and hold for 5-10 seconds.   °• Straight Leg Raises - With your knee straight (if you were given a brace, keep it on),  lift the leg to 60 degrees, hold for 3 seconds, and slowly lower the leg.  Perform this exercise against resistance later as your leg gets stronger.  °• Leg Slides: Lying on your back, slowly slide your foot toward your buttocks, bending your knee up off the floor (only go as far as is comfortable). Then slowly slide your foot back down until your leg is flat on the floor again.  °• Angel Wings: Lying on your back spread your legs to the side as far apart as you can without causing discomfort.  °• Hamstring Strength:  Lying on your back, push your heel against the floor with your leg straight by tightening up the muscles of your buttocks.  Repeat, but this time bend your knee to a comfortable angle, and push your heel against the floor.  You may put a pillow under the heel to make it more comfortable if necessary.  ° °A rehabilitation program following joint replacement surgery can speed recovery and prevent re-injury in the future due to weakened muscles. Contact your doctor or a physical therapist for more information on knee rehabilitation.  ° ° °CONSTIPATION ° °Constipation is defined medically as fewer than three stools per week and severe constipation as less than one stool per week.  Even if you have a regular bowel pattern at home, your normal regimen is likely to be disrupted due to multiple reasons following surgery.  Combination of anesthesia, postoperative narcotics, change in appetite and fluid intake all can affect your bowels.  ° °YOU MUST use at least one of the following options; they are listed in order of increasing strength to get the job done.  They are all available over the counter, and you may need to use some, POSSIBLY even all of these options:   ° °Drink plenty of fluids (prune juice may be helpful) and high fiber foods °Colace 100 mg by mouth twice a day  °Senokot for constipation as directed and as needed Dulcolax (bisacodyl), take with full glass of water  °Miralax (polyethylene glycol)  once or twice a day as needed. ° °If you have tried all these things and are unable to have a bowel movement in the first 3-4 days after surgery call either your surgeon or your primary doctor.   ° °If you experience loose stools or diarrhea, hold the medications until you stool forms back up.  If your symptoms do not get better within 1 week or if they get worse, check with your doctor.  If you experience "the worst abdominal pain ever" or develop nausea or vomiting, please contact the office immediately for further recommendations for treatment. ° ° °ITCHING:  If you experience itching with your medications, try taking only a single pain pill, or even half a pain pill at a time.  You can also use Benadryl over the counter for itching or also to   help with sleep.   TED HOSE STOCKINGS:  Use stockings on both legs until for at least 2 weeks or as directed by physician office. They may be removed at night for sleeping.  MEDICATIONS:  See your medication summary on the After Visit Summary that nursing will review with you.  You may have some home medications which will be placed on hold until you complete the course of blood thinner medication.  It is important for you to complete the blood thinner medication as prescribed.  PRECAUTIONS:  If you experience chest pain or shortness of breath - call 911 immediately for transfer to the hospital emergency department.   If you develop a fever greater that 101 F, purulent drainage from wound, increased redness or drainage from wound, foul odor from the wound/dressing, or calf pain - CONTACT YOUR SURGEON.                                                   FOLLOW-UP APPOINTMENTS:  If you do not already have a post-op appointment, please call the office for an appointment to be seen by your surgeon.  Guidelines for how soon to be seen are listed in your After Visit Summary, but are typically between 1-4 weeks after surgery.  OTHER INSTRUCTIONS:   Knee  Replacement:  Do not place pillow under knee, focus on keeping the knee straight while resting.   MAKE SURE YOU:   Understand these instructions.   Get help right away if you are not doing well or get worse.    Thank you for letting us be a part of your medical care team.  It is a privilege we respect greatly.  We hope these instructions will help you stay on track for a fast and full recovery!    Information on my medicine - XARELTO (Rivaroxaban)  This medication education was reviewed with me or my healthcare representative as part of my discharge preparation.  The pharmacist that spoke with me during my hospital stay was:  [facility]  Why was Xarelto prescribed for you? Xarelto was prescribed for you to reduce the risk of blood clots forming after orthopedic surgery. The medical term for these abnormal blood clots is venous thromboembolism (VTE).  What do you need to know about xarelto ? Take your Xarelto ONCE DAILY at the same time every day. You may take it either with or without food.  If you have difficulty swallowing the tablet whole, you may crush it and mix in applesauce just prior to taking your dose.  Take Xarelto exactly as prescribed by your doctor and DO NOT stop taking Xarelto without talking to the doctor who prescribed the medication.  Stopping without other VTE prevention medication to take the place of Xarelto may increase your risk of developing a clot.  After discharge, you should have regular check-up appointments with your healthcare provider that is prescribing your Xarelto.    What do you do if you miss a dose? If you miss a dose, take it as soon as you remember on the same day then continue your regularly scheduled once daily regimen the next day. Do not take two doses of Xarelto on the same day.   Important Safety Information A possible side effect of Xarelto is bleeding. You should call your healthcare provider right away if you  experience any of the following: ? Bleeding from an injury or your nose that does not stop. ? Unusual colored urine (red or dark brown) or unusual colored stools (red or black). ? Unusual bruising for unknown reasons. ? A serious fall or if you hit your head (even if there is no bleeding).  Some medicines may interact with Xarelto and might increase your risk of bleeding while on Xarelto. To help avoid this, consult your healthcare provider or pharmacist prior to using any new prescription or non-prescription medications, including herbals, vitamins, non-steroidal anti-inflammatory drugs (NSAIDs) and supplements.  This website has more information on Xarelto: [email or web address]

## 2015-05-19 NOTE — Op Note (Signed)
NAME:  Kyle Blair                      MEDICAL RECORD NO.:  [id number]                             FACILITY:  [facility]      PHYSICIAN:  Pietro Cassis. Alvan Dame, M.D.  DATE OF BIRTH:  08/19/1961      DATE OF PROCEDURE:  05/19/2015                                     OPERATIVE REPORT         PREOPERATIVE DIAGNOSIS:  Right knee osteoarthritis.      POSTOPERATIVE DIAGNOSIS:  Right knee osteoarthritis.      FINDINGS:  The patient was noted to have complete loss of cartilage and   bone-on-bone arthritis with associated osteophytes in the medial and patellofemoral compartments of   the knee with a significant synovitis and associated effusion.      PROCEDURE:  Right total knee replacement.      COMPONENTS USED:  DePuy Attune rotating platform posterior stabilized knee   system, a size 6N femur, 6 tibia, size 6 mm PS AOX insert, and 38 patellar   button.      SURGEON:  Pietro Cassis. Alvan Dame, M.D.      ASSISTANT:  Danae Orleans, PA-C.      ANESTHESIA:  General and Regional.      SPECIMENS:  None.      COMPLICATION:  None.      DRAINS:   None.  EBL: <150cc      TOURNIQUET TIME:   Total Tourniquet Time Documented: Thigh (Right) - 29 minutes Total: Thigh (Right) - 29 minutes  .      The patient was stable to the recovery room.      INDICATION FOR PROCEDURE:  Kyle Blair is a 54 y.o. male patient of   mine.  The patient had been seen, evaluated, and treated conservatively in the   office with medication, activity modification, and injections.  The patient had   radiographic changes of bone-on-bone arthritis with endplate sclerosis and osteophytes noted.      The patient failed conservative measures including medication, injections, and activity modification, and at this point was ready for more definitive measures.   Based on the radiographic changes and failed conservative measures, the patient   decided to proceed with total knee replacement.  Risks of infection,   DVT, component  failure, need for revision surgery, postop course, and   expectations were all   discussed and reviewed.  Consent was obtained for benefit of pain   relief.      PROCEDURE IN DETAIL:  The patient was brought to the operative theater.   Once adequate anesthesia, preoperative antibiotics, 1 gm of Vancomycin, 1gm of Tranexamic Acid, and 10mg  of Decadron administered, the patient was positioned supine with the right thigh tourniquet placed.  The  right lower extremity was prepped and draped in sterile fashion.  A time-   out was performed identifying the patient, planned procedure, and   extremity.      The right lower extremity was placed in the [facility] leg holder.  The leg was   exsanguinated, tourniquet elevated to 250 mmHg.  A midline incision was   made followed by  median parapatellar arthrotomy.  Following initial   exposure, attention was first directed to the patella.  Precut   measurement was noted to be 24 mm.  I resected down to 15 mm and used a   38 patellar button to restore patellar height as well as cover the cut   surface.      The lug holes were drilled and a metal shim was placed to protect the   patella from retractors and saw blades.      At this point, attention was now directed to the femur.  The femoral   canal was opened with a drill, irrigated to try to prevent fat emboli.  An   intramedullary rod was passed at 3 degrees valgus, 9 mm of bone was   resected off the distal femur.  Following this resection, the tibia was   subluxated anteriorly.  Using the extramedullary guide, 6 mm of bone was resected off   the proximal medial tibia.  We confirmed the gap would be   stable medially and laterally with a size 22mm insert as well as confirmed   the cut was perpendicular in the coronal plane, checking with an alignment rod.      Once this was done, I sized the femur to be a size 6 in the anterior-   posterior dimension, chose a narrow component based on medial and    lateral dimension.  The size 6 rotation block was then pinned in   position anterior referenced using the C-clamp to set rotation.  The   anterior, posterior, and  chamfer cuts were made without difficulty nor   notching making certain that I was along the anterior cortex to help   with flexion gap stability.      The final box cut was made off the lateral aspect of distal femur.      At this point, the tibia was sized to be a size 6, the size 6 tray was   then pinned in position through the medial third of the tubercle,   drilled, and keel punched.  Trial reduction was now carried with a 6 femur,  6 tibia, a size 6 mm insert, and the 38 patella botton.  The knee was brought to   extension, full extension with good flexion stability with the patella   tracking through the trochlea without application of pressure.  Given   all these findings, the trial components removed.  Final components were   opened and cement was mixed.  The knee was irrigated with normal saline   solution and pulse lavage.  The synovial lining was   then injected with 30cc of 0.25% Marcaine with epinephrine and 1 cc of Toradol plus 30cc of NS for a total of 61 cc.      The knee was irrigated.  Final implants were then cemented onto clean and   dried cut surfaces of bone with the knee brought to extension with a size 6   mm trial insert.      Once the cement had fully cured, the excess cement was removed   throughout the knee.  I confirmed I was satisfied with the range of   motion and stability, and the final size 6 mm PS AOX insert was chosen.  It was   placed into the knee.      The tourniquet had been let down at 29 minutes.  No significant   hemostasis required.  The   extensor mechanism was  then reapproximated using #1 Vicryl with the knee   in flexion.  The   remaining wound was closed with 2-0 Vicryl and running 4-0 Monocryl.   The knee was cleaned, dried, dressed sterilely using Dermabond and   Aquacel  dressing.  The patient was then   brought to recovery room in stable condition, tolerating the procedure   well.   Please note that Physician Assistant, Danae Orleans PA-C, was present for the entirety of the case, and was utilized for pre-operative positioning, peri-operative retractor management, general facilitation of the procedure.  He was also utilized for primary wound closure at the end of the case.              Pietro Cassis Alvan Dame, M.D.    05/19/2015 11:30 AM

## 2015-05-19 NOTE — Progress Notes (Signed)
AssistedDr. Landry Dyke with right femoral block. Side rails up, monitors on throughout procedure. See vital signs in flow sheet. Tolerated Procedure well. Sedation administered by CRNA.

## 2015-05-19 NOTE — Interval H&P Note (Signed)
History and Physical Interval Note:  05/19/2015 8:37 AM  Kyle Blair  has presented today for surgery, with the diagnosis of RIGHT KNEE OA  The various methods of treatment have been discussed with the patient and family. After consideration of risks, benefits and other options for treatment, the patient has consented to  Procedure(s): RIGHT TOTAL KNEE ARTHROPLASTY (Right) as a surgical intervention .  The patient's history has been reviewed, patient examined, no change in status, stable for surgery.  I have reviewed the patient's chart and labs.  Questions were answered to the patient's satisfaction.     Mauri Pole

## 2015-05-19 NOTE — Anesthesia Procedure Notes (Addendum)
Anesthesia Regional Block:  Femoral nerve block  Pre-Anesthetic Checklist: ,, timeout performed, Correct Patient, Correct Site, Correct Laterality, Correct Procedure, Correct Position, site marked, Risks and benefits discussed,  Surgical consent,  Pre-op evaluation,  At surgeon's request and post-op pain management  Laterality: Right  Prep: chloraprep       Needles:  Injection technique: Single-shot  Needle Type: Echogenic Stimulator Needle     Needle Length: 13cm 13 cm Needle Gauge: 20 and 20 G    Additional Needles:  Procedures: ultrasound guided (picture in chart) and nerve stimulator Femoral nerve block  Nerve Stimulator or Paresthesia:  Response: 0.5 mA,   Additional Responses:   Narrative:  Start time: 05/19/2015 9:40 AM End time: 05/19/2015 9:50 AM Injection made incrementally with aspirations every 5 mL.  Performed by: Personally  Anesthesiologist: [name]  Additional Notes: Patient tolerated the procedure well without complications   Procedure Name: Intubation Date/Time: 05/19/2015 10:26 AM Performed by: [name] Pre-anesthesia Checklist: Patient identified, Emergency Drugs available, Suction available and Patient being monitored Patient Re-evaluated:Patient Re-evaluated prior to inductionOxygen Delivery Method: Circle System Utilized Preoxygenation: Pre-oxygenation with 100% oxygen Intubation Type: IV induction Ventilation: Mask ventilation without difficulty Laryngoscope Size: Mac and 4 Grade View: Grade I Tube type: Oral Tube size: 7.5 mm Number of attempts: 1 Airway Equipment and Method: Stylet and Oral airway Placement Confirmation: ETT inserted through vocal cords under direct vision,  positive ETCO2 and breath sounds checked- equal and bilateral Tube secured with: Tape Dental Injury: Teeth and Oropharynx as per pre-operative assessment

## 2015-05-19 NOTE — Evaluation (Signed)
Physical Therapy Evaluation Patient Details Name: Kyle Blair MRN: [id number] DOB: November 19, 1961 Today's Date: 05/19/2015   History of Present Illness  s/p RTKA and also got an injection into the L knee as well.   Clinical Impression  Pt s/p R TKA with decreased ROM and strength and decreased mobility to benefit from PT to be able to return home at Modified I level with PRN assist.     Follow Up Recommendations Home health PT    Equipment Recommendations  Rolling walker with 5" wheels (pt has a rollator and would prefer not to get a new RW if he is safe with the rollator that he has. Will try with our PT sessions)    Recommendations for Other Services       Precautions / Restrictions Precautions Precautions: Knee Precaution Comments: educated pt about knee precaitions and to NOT place a pillow under the knee adn why. Pt still couldn't understand why he couldnt place one under there for comfort and stted he will do that at home. So continue to educate pt about knee precuations and no pillow under the knee.  Restrictions Weight Bearing Restrictions: No      Mobility  Bed Mobility Overal bed mobility: Needs Assistance Bed Mobility: Supine to Sit;Sit to Supine     Supine to sit: Mod assist     General bed mobility comments: pt with HOD elevated and use of trapeze bar and assisted upper bodya dn LE. But once pt knew what he was doing, he moved very quickly to edge of bed. Not consistent.   Transfers Overall transfer level: Needs assistance Equipment used: Rolling walker (2 wheeled) Transfers: Sit to/from Stand Sit to Stand: Min assist         General transfer comment: sit to stand , pt with very quick, irratic movment to stand (educated to take a little more controlled) and stand to sit , assited with RLE and VC for hand and foot placement.   Ambulation/Gait Ambulation/Gait assistance: Min assist Ambulation Distance (Feet): 5 Feet Assistive device: Rolling walker (2  wheeled) Gait Pattern/deviations: Step-to pattern     General Gait Details: slow and pt a=only wanted to go to chair tonight .Marland Kitchen "I will walk tomorrow, not tonight".   Stairs            Wheelchair Mobility    Modified Rankin (Stroke Patients Only)       Balance                                             Pertinent Vitals/Pain Pain Assessment: 0-10 Pain Score: 9  Pain Location: R knee.. stated 9/10 after PT session  Pain Descriptors / Indicators: Aching Pain Intervention(s): Monitored during session;Ice applied;Patient requesting pain meds-RN notified (RN was going to give more pain meds once he made it to the chair. )    Home Living Family/patient expects to be discharged to:: Private residence Living Arrangements: Spouse/significant other (and children) Available Help at Discharge: Family Type of Home: Apartment Home Access: Level entry     Home Layout: One level Home Equipment: Environmental consultant - 4 wheels;Cane - single point (has rollator and was not pleased to hear he may need a regular RW to start with. )      Prior Function Level of Independence: Independent with assistive device(s)         Comments: difficult to  understand pt's tru PLOF due to the way he continued to answer the questions and not in a direct manner. Sounds like pt used rollator and cane at time, however stated he waled the dog the day before surgery, but pain was so bad he came back and cried.      Hand Dominance        Extremity/Trunk Assessment               Lower Extremity Assessment: RLE deficits/detail RLE Deficits / Details: limtied 10-50 degrees ROM, difficult to get pt to understand knee extension /quad set for straightening. grossly 3/5 for strength.        Communication   Communication: No difficulties  Cognition Arousal/Alertness: Awake/alert Behavior During Therapy: WFL for tasks assessed/performed Overall Cognitive Status: Within Functional Limits for  tasks assessed                      General Comments      Exercises Total Joint Exercises Quad Sets: AAROM;Right;5 reps;Supine (very difficult to get him to understand this exercise) Heel Slides: Supine;AAROM;Right;10 reps Hip ABduction/ADduction: AROM;Supine;Right;10 reps Straight Leg Raises: AAROM;Supine;Right;5 reps Goniometric ROM: 10-50 supine      Assessment/Plan    PT Assessment Patient needs continued PT services  PT Diagnosis Difficulty walking   PT Problem List Decreased strength;Decreased range of motion;Decreased activity tolerance;Decreased mobility;Decreased knowledge of use of DME  PT Treatment Interventions Gait training;Stair training;Functional mobility training;Therapeutic activities;DME instruction;Therapeutic exercise;Patient/family education   PT Goals (Current goals can be found in the Care Plan section) Acute Rehab PT Goals Patient Stated Goal: I want to have less pain and be able to do things. PT Goal Formulation: With patient Time For Goal Achievement: 06/02/15 Potential to Achieve Goals: Good    Frequency 7X/week   Barriers to discharge        Co-evaluation               End of Session   Activity Tolerance: Patient tolerated treatment well Patient left: in chair Nurse Communication: Mobility status         Time: 1655-1730 PT Time Calculation (min) (ACUTE ONLY): 35 min   Charges:   PT Evaluation $Initial PT Evaluation Tier I: 1 Procedure PT Treatments $Therapeutic Exercise: 8-22 mins   PT G CodesClide Dales 2015-06-04, 5:50 PM Clide Dales, PT Pager: [phone] Jun 04, 2015

## 2015-05-19 NOTE — Transfer of Care (Signed)
Immediate Anesthesia Transfer of Care Note  Patient: Kyle Blair  Procedure(s) Performed: Procedure(s): RIGHT TOTAL KNEE ARTHROPLASTY (Right)  Patient Location: PACU  Anesthesia Type:GA combined with regional for post-op pain  Level of Consciousness: awake and alert   Airway & Oxygen Therapy: Patient Spontanous Breathing and Patient connected to face mask oxygen  Post-op Assessment: Report given to RN and Post -op Vital signs reviewed and stable  Post vital signs: Reviewed and stable  Last Vitals:  Filed Vitals:   05/19/15 0959  BP:   Pulse: 70  Temp:   Resp: 11    Complications: No apparent anesthesia complications

## 2015-05-19 NOTE — Anesthesia Postprocedure Evaluation (Signed)
  Anesthesia Post-op Note  Patient: Kyle Blair  Procedure(s) Performed: Procedure(s) (LRB): RIGHT TOTAL KNEE ARTHROPLASTY (Right)  Patient Location: PACU  Anesthesia Type: GA combined with regional for post-op pain  Level of Consciousness: awake and alert   Airway and Oxygen Therapy: Patient Spontanous Breathing  Post-op Pain: mild  Post-op Assessment: Post-op Vital signs reviewed, Patient's Cardiovascular Status Stable, Respiratory Function Stable, Patent Airway and No signs of Nausea or vomiting  Last Vitals:  Filed Vitals:   05/19/15 1307  BP: 126/76  Pulse: 87  Temp: 36.7 C  Resp: 15    Post-op Vital Signs: stable   Complications: No apparent anesthesia complications

## 2015-05-19 NOTE — Anesthesia Preprocedure Evaluation (Addendum)
Anesthesia Evaluation  Patient identified by MRN, date of birth, ID band Patient awake    Reviewed: Allergy & Precautions, H&P , NPO status , Patient's Chart, lab work & pertinent test results, reviewed documented beta blocker date and time   Airway Mallampati: II  TM Distance: >3 FB Neck ROM: full    Dental no notable dental hx. (+) Teeth Intact, Dental Advisory Given   Pulmonary asthma , former smoker,  breath sounds clear to auscultation  Pulmonary exam normal       Cardiovascular hypertension, Pt. on medications Normal cardiovascular examRhythm:regular Rate:Normal     Neuro/Psych CVA negative psych ROS   GI/Hepatic negative GI ROS, Neg liver ROS, GERD-  Medicated and Controlled,Barrett's esophagus   Endo/Other  Hypothyroidism   Renal/GU negative Renal ROS  negative genitourinary   Musculoskeletal   Abdominal   Peds  Hematology negative hematology ROS (+) Platelets 139K   Anesthesia Other Findings   Reproductive/Obstetrics negative OB ROS                            Anesthesia Physical Anesthesia Plan  ASA: III  Anesthesia Plan: General   Post-op Pain Management:    Induction: Intravenous  Airway Management Planned: Oral ETT  Additional Equipment:   Intra-op Plan:   Post-operative Plan: Extubation in OR  Informed Consent: I have reviewed the patients History and Physical, chart, labs and discussed the procedure including the risks, benefits and alternatives for the proposed anesthesia with the patient or authorized representative who has indicated his/her understanding and acceptance.   Dental Advisory Given  Plan Discussed with: CRNA and Surgeon  Anesthesia Plan Comments:        Anesthesia Quick Evaluation

## 2015-05-20 DIAGNOSIS — E669 Obesity, unspecified: Secondary | ICD-10-CM | POA: Diagnosis present

## 2015-05-20 LAB — BASIC METABOLIC PANEL
Anion gap: 8 (ref 5–15)
BUN: 14 mg/dL (ref 6–20)
CALCIUM: 8.7 mg/dL — AB (ref 8.9–10.3)
CHLORIDE: 104 mmol/L (ref 101–111)
CO2: 27 mmol/L (ref 22–32)
Creatinine, Ser: 0.78 mg/dL (ref 0.61–1.24)
GFR calc non Af Amer: >60 mL/min (ref >60)
Glucose, Bld: 164 mg/dL — ABNORMAL HIGH (ref 65–99)
Potassium: 4.5 mmol/L (ref 3.5–5.1)
Sodium: 139 mmol/L (ref 135–145)

## 2015-05-20 LAB — CBC
HCT: 39.3 % (ref 39.0–52.0)
HEMOGLOBIN: 13.2 g/dL (ref 13.0–17.0)
MCH: 29.8 pg (ref 26.0–34.0)
MCHC: 33.6 g/dL (ref 30.0–36.0)
MCV: 88.7 fL (ref 78.0–100.0)
PLATELETS: 158 10*3/uL (ref 150–400)
RBC: 4.43 MIL/uL (ref 4.22–5.81)
RDW: 13.2 % (ref 11.5–15.5)
WBC: 11.8 10*3/uL — ABNORMAL HIGH (ref 4.0–10.5)

## 2015-05-20 NOTE — Care Management Note (Signed)
Case Management Note  Patient Details  Name: Kyle Blair MRN: [id number] Date of Birth: 01/19/1961  Subjective/Objective:                   RIGHT TOTAL KNEE ARTHROPLASTY (Right)  Action/Plan: Discharge planning  Expected Discharge Date:  05/21/15               Expected Discharge Plan:  [place]  In-House Referral:     Discharge planning Services  CM Consult  Post Acute Care Choice:  Home Health Choice offered to:     DME Arranged:    DME Agency:     HH Arranged:  PT [place]:  [place]  Status of Service:  Completed, signed off  Medicare Important Message Given:    Date Medicare IM Given:    Medicare IM give by:    Date Additional Medicare IM Given:    Additional Medicare Important Message give by:     If discussed at [place] of Stay Meetings, dates discussed:    Additional Comments: 11:15 CM met with pt in room to offer choice of home health agency.  Pt chooses [facility] to render HHPT.  No DME is needed.  Address and contact information verified by pt.  Referral called to [facility] rep, Kristen.  No other CM needs were communicated. Dellie Catholic, RN 05/20/2015, 11:23 AM

## 2015-05-20 NOTE — Progress Notes (Signed)
OT Cancellation Note  Patient Details Name: Kyle Blair MRN: [id number] DOB: 03-25-1961   Cancelled Treatment:    Reason Eval/Treat Not Completed: Other (comment) Pt just finishing working with PT when OT came by. Per PT, pt with 8/10 pain after PT session also. Will check back later time.  [place], [place] 05/20/2015, 10:35 AM

## 2015-05-20 NOTE — Progress Notes (Signed)
Physical Therapy Treatment Patient Details Name: Kyle Blair MRN: [id number] DOB: Dec 29, 1960 Today's Date: 06/15/2015    History of Present Illness s/p RTKA and also got an injection into the L knee as well.     PT Comments    Pt cooperative and motivated but continues impulsive with mod cues to redirect.  Follow Up Recommendations  Home health PT     Equipment Recommendations  Rolling walker with 5" wheels    Recommendations for Other Services OT consult     Precautions / Restrictions Precautions Precautions: Knee Restrictions Weight Bearing Restrictions: No RLE Weight Bearing: Weight bearing as tolerated    Mobility  Bed Mobility               General bed mobility comments: OOB with nursing  Transfers Overall transfer level: Needs assistance Equipment used: Rolling walker (2 wheeled) Transfers: Sit to/from Stand Sit to Stand: Min assist         General transfer comment: Cues to SLOW DOWN and for saftey awareness, LE management and use of UEs to self assist.   Ambulation/Gait Ambulation/Gait assistance: Min assist Ambulation Distance (Feet): 56 Feet Assistive device: Rolling walker (2 wheeled) Gait Pattern/deviations: Step-to pattern;Shuffle;Trunk flexed     General Gait Details: Step by step cues for sequence, posture, position from RW and increased WB on L LE   Stairs            Wheelchair Mobility    Modified Rankin (Stroke Patients Only)       Balance                                    Cognition Arousal/Alertness: Awake/alert Behavior During Therapy: Impulsive;WFL for tasks assessed/performed Overall Cognitive Status: Within Functional Limits for tasks assessed                      Exercises Total Joint Exercises Ankle Circles/Pumps: AROM;Both;20 reps;Supine Quad Sets: AAROM;Supine;10 reps;Both Heel Slides: Supine;AAROM;Right;10 reps Straight Leg Raises: AAROM;Supine;Right;10 reps    General  Comments        Pertinent Vitals/Pain Pain Assessment: 0-10 Pain Score: 8  Pain Location: R knee Pain Descriptors / Indicators: Aching;Sore Pain Intervention(s): Limited activity within patient's tolerance;Monitored during session;Premedicated before session;Ice applied;Patient requesting pain meds-RN notified    Home Living                      Prior Function            PT Goals (current goals can now be found in the care plan section) Acute Rehab PT Goals Patient Stated Goal: Go hiking PT Goal Formulation: With patient Time For Goal Achievement: 06/02/15 Potential to Achieve Goals: Good Progress towards PT goals: Progressing toward goals    Frequency  7X/week    PT Plan Current plan remains appropriate    Co-evaluation             End of Session Equipment Utilized During Treatment: Gait belt Activity Tolerance: Patient tolerated treatment well Patient left: in chair     Time: 1003-1030 PT Time Calculation (min) (ACUTE ONLY): 27 min  Charges:  $Gait Training: 8-22 mins $Therapeutic Exercise: 8-22 mins                    G Codes:      Kyle Blair 15-Jun-2015, 12:19 PM

## 2015-05-20 NOTE — Progress Notes (Addendum)
Pt told RT this am that his wife was bringing his Albuterol inhaler that he takes BID at home. That he dose not want to call Respiratory for a neb. RT told pt that medication could not be left in room for him to take as needed that if he puffs on his inhaler all day and he becomes distressed we want know how to treat him. Pt stated he knows what distress is. RT told pt medication has to go through proper channels and asked if it was brought to let nursing know. Rt spoke with Pam RN she is aware.

## 2015-05-20 NOTE — Progress Notes (Signed)
     Subjective: 1 Day Post-Op Procedure(s) (LRB): RIGHT TOTAL KNEE ARTHROPLASTY (Right)   Patient reports pain as moderate, pain controlled. Still had to get a few doses of Dilaudid last night. Otherwise no events.  We discussed stopping the use of Dilaudid and controlling with oral meds.   Objective:   VITALS:   Filed Vitals:   05/20/15 0612  BP: 99/67  Pulse: 74  Temp: 97.5 F (36.4 C)  Resp: 16    Dorsiflexion/Plantar flexion intact Incision: dressing C/D/I No cellulitis present Compartment soft  LABS  Recent Labs  05/20/15 0410  HGB 13.2  HCT 39.3  WBC 11.8*  PLT 158     Recent Labs  05/20/15 0410  NA 139  K 4.5  BUN 14  CREATININE 0.78  GLUCOSE 164*     Assessment/Plan: 1 Day Post-Op Procedure(s) (LRB): RIGHT TOTAL KNEE ARTHROPLASTY (Right) Foley cath d/c'ed Advance diet Up with therapy D/C IV fluids Discharge home with home health eventually, when ready  Obese (BMI 30-39.9) Estimated body mass index is 36.33 kg/(m^2) as calculated from the following:   Height as of this encounter: 5\' 6"  (1.676 m).   Weight as of this encounter: 102.059 kg (225 lb). Patient also counseled that weight may inhibit the healing process Patient counseled that losing weight will help with future health issues        West Pugh. Reagan Klemz   PAC  05/20/2015, 7:46 AM

## 2015-05-21 LAB — CBC
HCT: 36.5 % — ABNORMAL LOW (ref 39.0–52.0)
Hemoglobin: 12.1 g/dL — ABNORMAL LOW (ref 13.0–17.0)
MCH: 29.8 pg (ref 26.0–34.0)
MCHC: 33.2 g/dL (ref 30.0–36.0)
MCV: 89.9 fL (ref 78.0–100.0)
PLATELETS: 152 10*3/uL (ref 150–400)
RBC: 4.06 MIL/uL — ABNORMAL LOW (ref 4.22–5.81)
RDW: 13.4 % (ref 11.5–15.5)
WBC: 8.4 10*3/uL (ref 4.0–10.5)

## 2015-05-21 LAB — BASIC METABOLIC PANEL
Anion gap: 8 (ref 5–15)
BUN: 14 mg/dL (ref 6–20)
CHLORIDE: 105 mmol/L (ref 101–111)
CO2: 29 mmol/L (ref 22–32)
Calcium: 8.8 mg/dL — ABNORMAL LOW (ref 8.9–10.3)
Creatinine, Ser: 0.75 mg/dL (ref 0.61–1.24)
GFR calc Af Amer: >60 mL/min (ref >60)
GLUCOSE: 137 mg/dL — AB (ref 65–99)
POTASSIUM: 3.8 mmol/L (ref 3.5–5.1)
Sodium: 142 mmol/L (ref 135–145)

## 2015-05-21 MED ORDER — HYDROMORPHONE HCL 2 MG PO TABS: 2.0000 mg | ORAL_TABLET | Freq: Every evening | ORAL | Status: DC | PRN

## 2015-05-21 NOTE — Progress Notes (Signed)
Physical Therapy Treatment Patient Details Name: Kyle Blair MRN: [id number] DOB: 22-Apr-1961 Today's Date: 05/21/2015    History of Present Illness s/p RTKA and also got an injection into the L knee as well.     PT Comments    Pt ltd this am by pain.  Pt ambulated limited distance to practice step up to get into high vehicle but unable to participate further.  Pt in bed, cold packs in place and RN providing meds.  Follow Up Recommendations  Home health PT     Equipment Recommendations  Rolling walker with 5" wheels    Recommendations for Other Services OT consult     Precautions / Restrictions Precautions Precautions: Knee Precaution Comments: Pt states he understands need to maintain knee in extension when resting Restrictions Weight Bearing Restrictions: No RLE Weight Bearing: Weight bearing as tolerated    Mobility  Bed Mobility Overal bed mobility: Needs Assistance Bed Mobility: Sit to Supine       Sit to supine: Min assist   General bed mobility comments: cues for sequence  Transfers Overall transfer level: Needs assistance Equipment used: Rolling walker (2 wheeled) Transfers: Sit to/from Stand Sit to Stand: Supervision         General transfer comment: cues for LE management and use of UEs to self assist  Ambulation/Gait Ambulation/Gait assistance: Min guard;Supervision Ambulation Distance (Feet): 35 Feet Assistive device: Rolling walker (2 wheeled) Gait Pattern/deviations: Step-to pattern;Decreased step length - right;Decreased step length - left;Shuffle;Trunk flexed     General Gait Details: min cues for posture and position from RW   Stairs Stairs: Yes Stairs assistance: Min assist Stair Management: No rails;Backwards;With walker;Step to pattern Number of Stairs: 2 General stair comments: single step twice to simulate stool to get into high JEEP.  Cues for sequence and foot placement  Wheelchair Mobility    Modified Rankin (Stroke  Patients Only)       Balance                                    Cognition Arousal/Alertness: Awake/alert Behavior During Therapy: WFL for tasks assessed/performed Overall Cognitive Status: Within Functional Limits for tasks assessed                      Exercises      General Comments        Pertinent Vitals/Pain Pain Assessment: 0-10 Pain Score: 8  Pain Location: R knee Pain Descriptors / Indicators: Burning;Aching;Sore;Tightness Pain Intervention(s): Limited activity within patient's tolerance;Monitored during session;Premedicated before session;Patient requesting pain meds-RN notified;Ice applied    Home Living Family/patient expects to be discharged to:: Private residence Living Arrangements: Spouse/significant other Available Help at Discharge: Family Type of Home: Apartment       Home Equipment: Bedside commode      Prior Function Level of Independence: Independent with assistive device(s)          PT Goals (current goals can now be found in the care plan section) Acute Rehab PT Goals Patient Stated Goal: Go hiking PT Goal Formulation: With patient Time For Goal Achievement: 06/02/15 Potential to Achieve Goals: Good Progress towards PT goals: Progressing toward goals    Frequency  7X/week    PT Plan Current plan remains appropriate    Co-evaluation             End of Session Equipment Utilized During Treatment: Gait belt Activity Tolerance:  Patient limited by pain Patient left: in bed;with call bell/phone within reach;with nursing/sitter in room     Time: 7673-4193 PT Time Calculation (min) (ACUTE ONLY): 11 min  Charges:  $Gait Training: 8-22 mins                    G Codes:      Kyle Blair 06-06-15, 12:44 PM

## 2015-05-21 NOTE — Progress Notes (Signed)
     Subjective: 2 Days Post-Op Procedure(s) (LRB): RIGHT TOTAL KNEE ARTHROPLASTY (Right)   Patient reports pain as moderate, pain controlled. No events throughout the night. Ready to be discharged home.  Objective:   VITALS:   Filed Vitals:   05/21/15 0532  BP: 138/86  Pulse: 83  Temp: 98.1 F (36.7 C)  Resp: 18    Dorsiflexion/Plantar flexion intact Incision: dressing C/D/I No cellulitis present Compartment soft  LABS  Recent Labs  05/20/15 0410 05/21/15 0420  HGB 13.2 12.1*  HCT 39.3 36.5*  WBC 11.8* 8.4  PLT 158 152     Recent Labs  05/20/15 0410 05/21/15 0420  NA 139 142  K 4.5 3.8  BUN 14 14  CREATININE 0.78 0.75  GLUCOSE 164* 137*     Assessment/Plan: 2 Days Post-Op Procedure(s) (LRB): RIGHT TOTAL KNEE ARTHROPLASTY (Right) Up with therapy Discharge home with home health  Follow up in 2 weeks at [facility]. Follow up with OLIN,Amybeth Sieg D in 2 weeks.  Contact information:  [facility] [address], [place] [phone]    Obese (BMI 30-39.9) Estimated body mass index is 36.33 kg/(m^2) as calculated from the following:   Height as of this encounter: 5\' 6"  (1.676 m).   Weight as of this encounter: 102.059 kg (225 lb). Patient also counseled that weight may inhibit the healing process Patient counseled that losing weight will help with future health issues      West Pugh. Alistair Senft   PAC  05/21/2015, 9:30 AM

## 2015-05-21 NOTE — Evaluation (Signed)
Occupational Therapy Evaluation Patient Details Name: Kyle Blair MRN: [id number] DOB: 02/26/1961 Today's Date: 05/21/2015    History of Present Illness s/p RTKA and also got an injection into the L knee as well.    Clinical Impression   Pt was admitted for the above.  Will follow in acute to continue education for bathroom transfers.  Pt plans to have wife assist with ADLs.  Educated on AE on this visit.  Goals are for supervision to min A.    Follow Up Recommendations  No OT follow up;Supervision/Assistance - 24 hour    Equipment Recommendations  None recommended by OT    Recommendations for Other Services       Precautions / Restrictions Precautions Precautions: Knee Restrictions Weight Bearing Restrictions: No RLE Weight Bearing: Weight bearing as tolerated      Mobility Bed Mobility               General bed mobility comments: OOB  Transfers   Equipment used: Rolling walker (2 wheeled) Transfers: Sit to/from Stand Sit to Stand: Min guard         General transfer comment: cues to extend leg when sitting    Balance                                            ADL Overall ADL's : Needs assistance/impaired             Lower Body Bathing: Sit to/from stand;Moderate assistance       Lower Body Dressing: Maximal assistance;Sit to/from stand   Toilet Transfer: Min guard;Ambulation (back to chair)             General ADL Comments: educated on AE for adls and leg lifter.  Pt feels he will wait for his wife to assist--she gets up late.  He was afraid to use tub prior to sx.  Will review tub bench as another option on next visit.  PA came in--will return later or tomorrow.  Pt's wife has a 3:1 which she didn't use.  Pt will use this over commode.  Wanted to perform grooming in sitting even though pain was less in standing than sitting     Vision     Perception     Praxis      Pertinent Vitals/Pain Pain Score: 8   Pain Location: R knee Pain Descriptors / Indicators: Aching Pain Intervention(s): Limited activity within patient's tolerance;Monitored during session;Premedicated before session;Repositioned;Ice applied     Hand Dominance     Extremity/Trunk Assessment Upper Extremity Assessment Upper Extremity Assessment: Overall WFL for tasks assessed           Communication Communication Communication: No difficulties   Cognition Arousal/Alertness: Awake/alert Behavior During Therapy: WFL for tasks assessed/performed Overall Cognitive Status: Within Functional Limits for tasks assessed                     General Comments       Exercises       Shoulder Instructions      Home Living Family/patient expects to be discharged to:: Private residence Living Arrangements: Spouse/significant other Available Help at Discharge: Family Type of Home: Apartment             Bathroom Shower/Tub: Tub/shower unit Shower/tub characteristics: Architectural technologist: Standard     Home Equipment: Bedside commode  Prior Functioning/Environment Level of Independence: Independent with assistive device(s)             OT Diagnosis: Generalized weakness   OT Problem List: Decreased strength;Decreased activity tolerance;Decreased knowledge of use of DME or AE;Pain   OT Treatment/Interventions: Self-care/ADL training;DME and/or AE instruction;Patient/family education    OT Goals(Current goals can be found in the care plan section) Acute Rehab OT Goals Patient Stated Goal: Go hiking OT Goal Formulation: With patient Time For Goal Achievement: 05/28/15 Potential to Achieve Goals: Good ADL Goals Pt Will Transfer to Toilet: with supervision;ambulating;bedside commode Pt Will Perform Tub/Shower Transfer: Tub transfer;tub bench;with min assist (vs verbalize understanding)  OT Frequency: Min 2X/week   Barriers to D/C:            Co-evaluation              End  of Session    Activity Tolerance: Patient tolerated treatment well;No increased pain Patient left: in chair;with call bell/phone within reach   Time: 0849-0904 OT Time Calculation (min): 15 min Charges:  OT General Charges $OT Visit: 1 Procedure OT Evaluation $Initial OT Evaluation Tier I: 1 Procedure G-Codes:    Vennesa Bastedo 23-May-2015, 9:34 AM  Lesle Chris, OTR/L [phone] May 23, 2015

## 2015-05-21 NOTE — Progress Notes (Signed)
OT Note:  Returned to talk to pt about tub transfer safety and tub bench vs. Sponge bathing initially.  He will sponge bathe.  No further OT needs.  [place], [place] [phone] 05/21/2015

## 2015-05-21 NOTE — Discharge Summary (Signed)
Physician Discharge Summary  Patient ID: Kyle Blair MRN: [id number] DOB/AGE: 1961-06-23 54 y.o.  Admit date: 05/19/2015 Discharge date: 05/21/2015   Procedures:  Procedure(s) (LRB): RIGHT TOTAL KNEE ARTHROPLASTY (Right)  Attending Physician:  Dr. 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 Asthma   . Polycystic kidney disease   . Hypothyroid   . GERD with stricture   . BPH (benign prostatic hypertrophy)   . Hyperlipidemia   . Lung nodule 02/03/2012  . History of stroke 2004  . Stroke   . Arthritis   . Hypertension     HPI:    Kyle Blair, 54 y.o. male, has a history of pain and functional disability in the right knee due to arthritis and has failed non-surgical conservative treatments for greater than 12 weeks to include NSAID's and/or analgesics, corticosteriod injections and activity modification. Onset of symptoms was gradual, starting years ago with gradually worsening course since that time. The patient noted no past surgery on the right knee(s). Patient currently rates pain in the bilaterally knee(s) at 9 out of 10 with activity. Patient has worsening of pain with activity and weight bearing, pain that interferes with activities of daily living, pain with passive range of motion, crepitus and joint swelling. Patient has evidence of periarticular osteophytes and joint space narrowing by imaging studies. Do to the OA and pain in the left knee it is felt that he would benefit from a cortisone injection in the left knee to help that knee as the right knee recovers form surgery. There is no active infection. Risks, benefits and expectations were discussed with the patient. Risks including but not limited to the risk of anesthesia, blood clots, nerve damage, blood  vessel damage, failure of the prosthesis, infection and up to and including death. Patient understand the risks, benefits and expectations and wishes to proceed with surgery.   PCP: Nance Pear., NP   Discharged Condition: good  Hospital Course:  Patient underwent the above stated procedure on 05/19/2015. Patient tolerated the procedure well and brought to the recovery room in good condition and subsequently to the floor.  POD #1 BP: 99/67 ; Pulse: 74 ; Temp: 97.5 F (36.4 C) ; Resp: 16 Patient reports pain as moderate, pain controlled. Still had to get a few doses of Dilaudid last night. Otherwise no events. We discussed stopping the use of Dilaudid and controlling with oral meds.  Dorsiflexion/plantar flexion intact, incision: dressing C/D/I, no cellulitis present and compartment soft.   LABS  Basename    HGB  13.2  HCT  39.3   POD #2  BP: 138/86 ; Pulse: 83 ; Temp: 98.1 F (36.7 C) ; Resp: 18 Patient reports pain as moderate, pain controlled. No events throughout the night. Ready to be discharged home. Dorsiflexion/plantar flexion intact, incision: dressing C/D/I, no cellulitis present and compartment soft.   LABS  Basename    HGB  12.1  HCT  36.5    Discharge Exam: General appearance: alert, cooperative and no distress Extremities: Homans sign is negative, no sign of DVT, no edema, redness or tenderness in the calves or thighs and no ulcers, gangrene or trophic changes  Disposition: Home with follow up in 2 weeks   Follow-up Information    Follow  up with Mauri Pole, MD. Schedule an appointment as soon as possible for a visit in 2 weeks.   Specialty:  Orthopedic Surgery   Contact information:   [address] [place] [address] [phone]       Follow up with [facility].   Why:  home health physical therapy   Contact information:   [address] [place] [address] [phone]       Discharge  Instructions    Call MD / Call 911    Complete by:  As directed   If you experience chest pain or shortness of breath, CALL 911 and be transported to the hospital emergency room.  If you develope a fever above 101 F, pus (white drainage) or increased drainage or redness at the wound, or calf pain, call your surgeon's office.     Change dressing    Complete by:  As directed   Maintain surgical dressing until follow up in the clinic. If the edges start to pull up, may reinforce with tape. If the dressing is no longer working, may remove and cover with gauze and tape, but must keep the area dry and clean.  Call with any questions or concerns.     Constipation Prevention    Complete by:  As directed   Drink plenty of fluids.  Prune juice may be helpful.  You may use a stool softener, such as Colace (over the counter) 100 mg twice a day.  Use MiraLax (over the counter) for constipation as needed.     Diet - low sodium heart healthy    Complete by:  As directed      Discharge instructions    Complete by:  As directed   Maintain surgical dressing until follow up in the clinic. If the edges start to pull up, may reinforce with tape. If the dressing is no longer working, may remove and cover with gauze and tape, but must keep the area dry and clean.  Follow up in 2 weeks at [facility]. Call with any questions or concerns.     Increase activity slowly as tolerated    Complete by:  As directed      TED hose    Complete by:  As directed   Use stockings (TED hose) for 2 weeks on both leg(s).  You may remove them at night for sleeping.     Weight bearing as tolerated    Complete by:  As directed   Laterality:  right  Extremity:  Lower             Medication List    STOP taking these medications        oxyCODONE-acetaminophen 10-325 MG per tablet  Commonly known as:  PERCOCET     pantoprazole 40 MG tablet  Commonly known as:  PROTONIX      TAKE these medications         acetaminophen 325 MG tablet  Commonly known as:  TYLENOL  Take 1-2 tablets (325-650 mg total) by mouth every 6 (six) hours as needed.     albuterol (2.5 MG/3ML) 0.083% nebulizer solution  Commonly known as:  PROVENTIL  Take 3 mLs (2.5 mg total) by nebulization every 6 (six) hours as needed. For shortness of breath and wheezing     albuterol 108 (90 BASE) MCG/ACT inhaler  Commonly known as:  PROAIR HFA  Inhale 2 puffs into the lungs every 6 (six) hours as needed for  wheezing.     amLODipine 5 MG tablet  Commonly known as:  NORVASC  Take 1 tablet (5 mg total) by mouth daily.     benazepril 10 MG tablet  Commonly known as:  LOTENSIN  Take 1 tablet (10 mg total) by mouth daily.     diphenhydrAMINE 25 MG tablet  Commonly known as:  SOMINEX  Take 25 mg by mouth at bedtime as needed for sleep.     diphenhydrAMINE 25 MG tablet  Commonly known as:  BENADRYL  Take 25 mg by mouth at bedtime as needed.     docusate sodium 100 MG capsule  Commonly known as:  COLACE  Take 1 capsule (100 mg total) by mouth 2 (two) times daily.     ferrous sulfate 325 (65 FE) MG tablet  Take 1 tablet (325 mg total) by mouth 3 (three) times daily after meals.     Fluticasone-Salmeterol 250-50 MCG/DOSE Aepb  Commonly known as:  ADVAIR DISKUS  Inhale 1 puff into the lungs 2 (two) times daily.     furosemide 20 MG tablet  Commonly known as:  LASIX  Take 1 tablet (20 mg total) by mouth daily.     HYDROmorphone 2 MG tablet  Commonly known as:  DILAUDID  Take 1 tablet (2 mg total) by mouth at bedtime as needed for severe pain.     levothyroxine 175 MCG tablet  Commonly known as:  SYNTHROID, LEVOTHROID  Take 1 tablet (175 mcg total) by mouth daily before breakfast.     lovastatin 40 MG tablet  Commonly known as:  MEVACOR  Take 1 tablet (40 mg total) by mouth at bedtime.     methocarbamol 500 MG tablet  Commonly known as:  ROBAXIN  Take 1 tablet (500 mg total) by mouth every 6 (six) hours as needed  for muscle spasms.     montelukast 10 MG tablet  Commonly known as:  SINGULAIR  TAKE 1 TABLET BY MOUTH EVERY NIGHT AT BEDTIME     nystatin 100000 UNIT/ML suspension  Commonly known as:  MYCOSTATIN  Take 5 mLs (500,000 Units total) by mouth 4 (four) times daily.     omeprazole 40 MG capsule  Commonly known as:  PRILOSEC  TAKE ONE CAPSULE BY MOUTH EVERY DAY     oxyCODONE 5 MG immediate release tablet  Commonly known as:  Oxy IR/ROXICODONE  Take 1-3 tablets (5-15 mg total) by mouth every 4 (four) hours as needed for severe pain.     polyethylene glycol packet  Commonly known as:  MIRALAX / GLYCOLAX  Take 17 g by mouth 2 (two) times daily.     rivaroxaban 10 MG Tabs tablet  Commonly known as:  XARELTO  Take 1 tablet (10 mg total) by mouth daily.     tamsulosin 0.4 MG Caps capsule  Commonly known as:  FLOMAX  Take 1 capsule (0.4 mg total) by mouth at bedtime.         Signed: [place] Pugh. Mackensey Bolte   PA-C  05/21/2015, 5:47 PM

## 2015-06-12 ENCOUNTER — Other Ambulatory Visit: Payer: Self-pay | Admitting: Family

## 2015-06-12 NOTE — Telephone Encounter (Signed)
Medication Detail      Disp Refills Start End     amLODipine (NORVASC) 5 MG tablet 30 tablet 2 03/16/2015     Sig - Route: Take 1 tablet (5 mg total) by mouth daily. - Oral    E-Prescribing Status: Receipt confirmed by pharmacy (03/16/2015 12:03 PM EDT)    Medication Detail      Disp Refills Start End     benazepril (LOTENSIN) 10 MG tablet 30 tablet 2 03/16/2015     Sig - Route: Take 1 tablet (10 mg total) by mouth daily. - Oral    E-Prescribing Status: Receipt confirmed by pharmacy (03/16/2015 12:03 PM EDT)     Pharmacy    [id number] - [place], [place] - [address]   Rx request to pharmacy/SLS

## 2015-06-18 ENCOUNTER — Ambulatory Visit: Payer: Medicare Other

## 2015-07-02 ENCOUNTER — Other Ambulatory Visit: Payer: Self-pay | Admitting: Family

## 2015-07-07 ENCOUNTER — Other Ambulatory Visit: Payer: Self-pay | Admitting: Family

## 2015-07-08 ENCOUNTER — Telehealth: Payer: Self-pay

## 2015-07-08 NOTE — Telephone Encounter (Signed)
Called to schedule Medicare Wellness Visit.  Left a message for call back.

## 2015-07-12 ENCOUNTER — Other Ambulatory Visit: Payer: Self-pay | Admitting: Family

## 2015-07-15 ENCOUNTER — Ambulatory Visit: Payer: Medicare Other | Admitting: Family

## 2015-07-20 ENCOUNTER — Encounter: Payer: Self-pay | Admitting: Family

## 2015-07-20 ENCOUNTER — Emergency Department (HOSPITAL_BASED_OUTPATIENT_CLINIC_OR_DEPARTMENT_OTHER): Payer: Medicare Other

## 2015-07-20 ENCOUNTER — Ambulatory Visit (INDEPENDENT_AMBULATORY_CARE_PROVIDER_SITE_OTHER): Payer: Medicare Other | Admitting: Family

## 2015-07-20 ENCOUNTER — Emergency Department (HOSPITAL_BASED_OUTPATIENT_CLINIC_OR_DEPARTMENT_OTHER)
Admission: EM | Admit: 2015-07-20 | Discharge: 2015-07-20 | Disposition: A | Discharge status: Home / Self Care | Payer: Medicare Other | Attending: Emergency Medicine | Admitting: Emergency Medicine

## 2015-07-20 ENCOUNTER — Encounter (HOSPITAL_BASED_OUTPATIENT_CLINIC_OR_DEPARTMENT_OTHER): Payer: Self-pay | Admitting: *Deleted

## 2015-07-20 VITALS — BP 126/84 | HR 72 | Temp 97.8°F | Resp 16 | Ht 65.0 in | Wt 240.0 lb

## 2015-07-20 DIAGNOSIS — I509 Heart failure, unspecified: Secondary | ICD-10-CM

## 2015-07-20 DIAGNOSIS — E039 Hypothyroidism, unspecified: Secondary | ICD-10-CM | POA: Insufficient documentation

## 2015-07-20 DIAGNOSIS — N400 Enlarged prostate without lower urinary tract symptoms: Secondary | ICD-10-CM | POA: Insufficient documentation

## 2015-07-20 DIAGNOSIS — R911 Solitary pulmonary nodule: Secondary | ICD-10-CM | POA: Insufficient documentation

## 2015-07-20 DIAGNOSIS — K219 Gastro-esophageal reflux disease without esophagitis: Secondary | ICD-10-CM | POA: Insufficient documentation

## 2015-07-20 DIAGNOSIS — J4541 Moderate persistent asthma with (acute) exacerbation: Secondary | ICD-10-CM

## 2015-07-20 DIAGNOSIS — Z79899 Other long term (current) drug therapy: Secondary | ICD-10-CM | POA: Insufficient documentation

## 2015-07-20 DIAGNOSIS — M1990 Unspecified osteoarthritis, unspecified site: Secondary | ICD-10-CM | POA: Diagnosis not present

## 2015-07-20 DIAGNOSIS — E785 Hyperlipidemia, unspecified: Secondary | ICD-10-CM | POA: Diagnosis not present

## 2015-07-20 DIAGNOSIS — Q613 Polycystic kidney, unspecified: Secondary | ICD-10-CM | POA: Insufficient documentation

## 2015-07-20 DIAGNOSIS — I10 Essential (primary) hypertension: Secondary | ICD-10-CM | POA: Insufficient documentation

## 2015-07-20 DIAGNOSIS — Z87891 Personal history of nicotine dependence: Secondary | ICD-10-CM | POA: Diagnosis not present

## 2015-07-20 DIAGNOSIS — Z8673 Personal history of transient ischemic attack (TIA), and cerebral infarction without residual deficits: Secondary | ICD-10-CM | POA: Diagnosis not present

## 2015-07-20 DIAGNOSIS — J45901 Unspecified asthma with (acute) exacerbation: Secondary | ICD-10-CM | POA: Diagnosis not present

## 2015-07-20 DIAGNOSIS — Z7901 Long term (current) use of anticoagulants: Secondary | ICD-10-CM | POA: Insufficient documentation

## 2015-07-20 DIAGNOSIS — R0602 Shortness of breath: Secondary | ICD-10-CM | POA: Diagnosis present

## 2015-07-20 DIAGNOSIS — R918 Other nonspecific abnormal finding of lung field: Secondary | ICD-10-CM

## 2015-07-20 DIAGNOSIS — B370 Candidal stomatitis: Secondary | ICD-10-CM | POA: Diagnosis not present

## 2015-07-20 DIAGNOSIS — M7989 Other specified soft tissue disorders: Secondary | ICD-10-CM

## 2015-07-20 LAB — CBC WITH DIFFERENTIAL/PLATELET
BASOS PCT: 0 % (ref 0–1)
Basophils Absolute: 0 10*3/uL (ref 0.0–0.1)
Eosinophils Absolute: 0 10*3/uL (ref 0.0–0.7)
Eosinophils Relative: 0 % (ref 0–5)
HCT: 39.5 % (ref 39.0–52.0)
HEMOGLOBIN: 13.5 g/dL (ref 13.0–17.0)
Lymphocytes Relative: 16 % (ref 12–46)
Lymphs Abs: 0.9 10*3/uL (ref 0.7–4.0)
MCH: 30.3 pg (ref 26.0–34.0)
MCHC: 34.2 g/dL (ref 30.0–36.0)
MCV: 88.8 fL (ref 78.0–100.0)
MONO ABS: 0.5 10*3/uL (ref 0.1–1.0)
Monocytes Relative: 9 % (ref 3–12)
NEUTROS ABS: 4.2 10*3/uL (ref 1.7–7.7)
NEUTROS PCT: 75 % (ref 43–77)
PLATELETS: 210 10*3/uL (ref 150–400)
RBC: 4.45 MIL/uL (ref 4.22–5.81)
RDW: 14.3 % (ref 11.5–15.5)
WBC: 5.7 10*3/uL (ref 4.0–10.5)

## 2015-07-20 LAB — BASIC METABOLIC PANEL
ANION GAP: 6 (ref 5–15)
BUN: 14 mg/dL (ref 6–20)
CO2: 30 mmol/L (ref 22–32)
CREATININE: 1.29 mg/dL — AB (ref 0.61–1.24)
Calcium: 9.1 mg/dL (ref 8.9–10.3)
Chloride: 99 mmol/L — ABNORMAL LOW (ref 101–111)
GFR calc Af Amer: >60 mL/min (ref >60)
Glucose, Bld: 97 mg/dL (ref 65–99)
POTASSIUM: 3.8 mmol/L (ref 3.5–5.1)
Sodium: 135 mmol/L (ref 135–145)

## 2015-07-20 LAB — BRAIN NATRIURETIC PEPTIDE: B Natriuretic Peptide: 6.1 pg/mL (ref 0.0–100.0)

## 2015-07-20 LAB — PROTIME-INR
INR: 0.95 (ref 0.00–1.49)
PROTHROMBIN TIME: 12.9 s (ref 11.6–15.2)

## 2015-07-20 LAB — TROPONIN I

## 2015-07-20 MED ORDER — IPRATROPIUM-ALBUTEROL 0.5-2.5 (3) MG/3ML IN SOLN
RESPIRATORY_TRACT | Status: AC
  Filled 2015-07-20: qty 3

## 2015-07-20 MED ORDER — ALBUTEROL SULFATE (2.5 MG/3ML) 0.083% IN NEBU
INHALATION_SOLUTION | RESPIRATORY_TRACT | Status: AC
  Filled 2015-07-20: qty 3

## 2015-07-20 MED ORDER — OXYCODONE-ACETAMINOPHEN 5-325 MG PO TABS
1.0000 | ORAL_TABLET | Freq: Once | ORAL | Status: AC
  Administered 2015-07-20: 1 via ORAL
  Filled 2015-07-20: qty 1

## 2015-07-20 MED ORDER — NYSTATIN 100000 UNIT/ML MT SUSP: 5.0000 mL | Freq: Four times a day (QID) | OROMUCOSAL | Status: DC

## 2015-07-20 MED ORDER — PREDNISONE 10 MG PO TABS: 60.0000 mg | ORAL_TABLET | Freq: Every day | ORAL | Status: DC

## 2015-07-20 MED ORDER — IPRATROPIUM-ALBUTEROL 0.5-2.5 (3) MG/3ML IN SOLN
3.0000 mL | Freq: Once | RESPIRATORY_TRACT | Status: AC
  Administered 2015-07-20: 3 mL via RESPIRATORY_TRACT

## 2015-07-20 MED ORDER — ALBUTEROL SULFATE (2.5 MG/3ML) 0.083% IN NEBU
2.5000 mg | INHALATION_SOLUTION | Freq: Once | RESPIRATORY_TRACT | Status: AC
  Administered 2015-07-20: 2.5 mg via RESPIRATORY_TRACT

## 2015-07-20 MED ORDER — IOHEXOL 350 MG/ML SOLN
100.0000 mL | Freq: Once | INTRAVENOUS | Status: AC | PRN
  Administered 2015-07-20: 100 mL via INTRAVENOUS

## 2015-07-20 MED ORDER — SODIUM CHLORIDE 0.9 % IV BOLUS (SEPSIS)
500.0000 mL | Freq: Once | INTRAVENOUS | Status: AC
  Administered 2015-07-20: 500 mL via INTRAVENOUS

## 2015-07-20 NOTE — Discharge Instructions (Signed)
Pulmonary Nodule A pulmonary nodule is a small, round growth of tissue in the lung. Pulmonary nodules can range in size from less than 1/5 inch (4 mm) to a little bigger than an inch (25 mm). Most pulmonary nodules are detected when imaging tests of the lung are being performed for a different problem. Pulmonary nodules are usually not cancerous (benign). However, some pulmonary nodules are cancerous (malignant). Follow-up treatment or testing is based on the size of the pulmonary nodule and your risk of getting lung cancer.  CAUSES Benign pulmonary nodules can be caused by various things. Some of the causes include:   Bacterial, fungal, or viral infections. This is usually an old infection that is no longer active, but it can sometimes be a current, active infection.  A benign mass of tissue.  Inflammation from conditions such as rheumatoid arthritis.   Abnormal blood vessels in the lungs. Malignant pulmonary nodules can result from lung cancer or from cancers that spread to the lung from other places in the body. SIGNS AND SYMPTOMS Pulmonary nodules usually do not cause symptoms. DIAGNOSIS Most often, pulmonary nodules are found incidentally when an X-ray or CT scan is performed to look for some other problem in the lung area. To help determine whether a pulmonary nodule is benign or malignant, your health care provider will take a medical history and order a variety of tests. Tests done may include:   Blood tests.  A skin test called a tuberculin test. This test is used to determine if you have been exposed to the germ that causes tuberculosis.   Chest X-rays. If possible, a new X-ray may be compared with X-rays you have had in the past.   CT scan. This test shows smaller pulmonary nodules more clearly than an X-ray.   Positron emission tomography (PET) scan. In this test, a safe amount of a radioactive substance is injected into the bloodstream. Then, the scan takes a picture of  the pulmonary nodule. The radioactive substance is eliminated from your body in your urine.   Biopsy. A tiny piece of the pulmonary nodule is removed so it can be checked under a microscope. TREATMENT  Pulmonary nodules that are benign normally do not require any treatment because they usually do not cause symptoms or breathing problems. Your health care provider may want to monitor the pulmonary nodule through follow-up CT scans. The frequency of these CT scans will vary based on the size of the nodule and the risk factors for lung cancer. For example, CT scans will need to be done more frequently if the pulmonary nodule is larger and if you have a history of smoking and a family history of cancer. Further testing or biopsies may be done if any follow-up CT scan shows that the size of the pulmonary nodule has increased. HOME CARE INSTRUCTIONS  Only take over-the-counter or prescription medicines as directed by your health care provider.  Keep all follow-up appointments with your health care provider. SEEK MEDICAL CARE IF:  You have trouble breathing when you are active.   You feel sick or unusually tired.   You do not feel like eating.   You lose weight without trying to.   You develop chills or night sweats.  SEEK IMMEDIATE MEDICAL CARE IF:  You cannot catch your breath, or you begin wheezing.   You cannot stop coughing.   You cough up blood.   You become dizzy or feel like you are going to pass out.   You  have sudden chest pain.   You have a fever or persistent symptoms for more than 2-3 days.   You have a fever and your symptoms suddenly get worse. MAKE SURE YOU:  Understand these instructions.  Will watch your condition.  Will get help right away if you are not doing well or get worse. Document Released: 10/09/2009 Document Revised: 08/14/2013 Document Reviewed: 06/03/2013 [facility] Patient Information 2015 [place], [place]. This information is not intended  to replace advice given to you by your health care provider. Make sure you discuss any questions you have with your health care provider.  Shortness of Breath Shortness of breath means you have trouble breathing. It could also mean that you have a medical problem. You should get immediate medical care for shortness of breath. CAUSES   Not enough oxygen in the air such as with high altitudes or a smoke-filled room.  Certain lung diseases, infections, or problems.  Heart disease or conditions, such as angina or heart failure.  Low red blood cells (anemia).  Poor physical fitness, which can cause shortness of breath when you exercise.  Chest or back injuries or stiffness.  Being overweight.  Smoking.  Anxiety, which can make you feel like you are not getting enough air. DIAGNOSIS  Serious medical problems can often be found during your physical exam. Tests may also be done to determine why you are having shortness of breath. Tests may include:  Chest X-rays.  Lung function tests.  Blood tests.  An electrocardiogram (ECG).  An ambulatory electrocardiogram. An ambulatory ECG records your heartbeat patterns over a 24-hour period.  Exercise testing.  A transthoracic echocardiogram (TTE). During echocardiography, sound waves are used to evaluate how blood flows through your heart.  A transesophageal echocardiogram (TEE).  Imaging scans. Your health care provider may not be able to find a cause for your shortness of breath after your exam. In this case, it is important to have a follow-up exam with your health care provider as directed.  TREATMENT  Treatment for shortness of breath depends on the cause of your symptoms and can vary greatly. HOME CARE INSTRUCTIONS   Do not smoke. Smoking is a common cause of shortness of breath. If you smoke, ask for help to quit.  Avoid being around chemicals or things that may bother your breathing, such as paint fumes and dust.  Rest as  needed. Slowly resume your usual activities.  If medicines were prescribed, take them as directed for the full length of time directed. This includes oxygen and any inhaled medicines.  Keep all follow-up appointments as directed by your health care provider. SEEK MEDICAL CARE IF:   Your condition does not improve in the time expected.  You have a hard time doing your normal activities even with rest.  You have any new symptoms. SEEK IMMEDIATE MEDICAL CARE IF:   Your shortness of breath gets worse.  You feel light-headed, faint, or develop a cough not controlled with medicines.  You start coughing up blood.  You have pain with breathing.  You have chest pain or pain in your arms, shoulders, or abdomen.  You have a fever.  You are unable to walk up stairs or exercise the way you normally do. MAKE SURE YOU:  Understand these instructions.  Will watch your condition.  Will get help right away if you are not doing well or get worse. Document Released: 09/06/2001 Document Revised: 12/17/2013 Document Reviewed: 02/27/2012 [facility] Patient Information 2015 [place], [place]. This information is not  intended to replace advice given to you by your health care provider. Make sure you discuss any questions you have with your health care provider. ° °

## 2015-07-20 NOTE — ED Notes (Signed)
Placed on cont cardiac monitoring with cont POX, VS q34min

## 2015-07-20 NOTE — ED Provider Notes (Signed)
CSN: [id number]     Arrival date & time 07/20/15  1359 History   First MD Initiated Contact with Patient 07/20/15 1409     Chief Complaint  Patient presents with  . Shortness of Breath     (Consider location/radiation/quality/duration/timing/severity/associated sxs/prior Treatment) HPI Comments: Pt. Is a 54 y/o gentleman with hx of HTN, HLD, Asthma, and recent knee replacement on the right in 04/2015. He also is taking lasix for peripheral edema of an unknown etiology at this time. He says that he started developing shortness of breath last night around 11pm. (> 12 hours ago). He says that he took albuterol every four hours overnight with little relief. He says that he feels as though he has some fluid overload and that he has been gaining weight recently. He says 20 lbs over the past week. He otherwise has no pain with deep inspiration, chest pain, nausea, vomiting, fever, chills. He has swelling in his right leg / knee that has occurred over the past 12 hours, with some slight redness of his right lower extremity. He says this is new. His leg is primarily tight as opposed to painful per the patient. He has no swelling of his left lower extremity. He has no back pain. No dizziness, no numbness, no tingling, no vision changes, some mild headache improved with NSAIDS. He is here primarily because of shortness of breath, and noted the swelling in his right leg annecdotally.   The history is provided by the patient.    Past Medical History  Diagnosis Date  . Asthma   . Polycystic kidney disease   . Hypothyroid   . GERD with stricture   . BPH (benign prostatic hypertrophy)   . Hyperlipidemia   . Lung nodule 02/03/2012  . History of stroke 2004  . Stroke   . Arthritis   . Hypertension    Past Surgical History  Procedure Laterality Date  . Hernia repair  2005    with mesh  . Nasal polyp excision  1992 or 93  . Carpal tunnel release    . Knee arthroscopy Bilateral 12/05/13    cyst and bone  spur removed from left knee per pt.  . Colonoscopy    . Polypectomy    . Knee arthroscopy Left 01/2015  . 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 Heart disease Mother     pacemaker  . Diabetes Father   . Kidney disease Father   . Diabetes Sister   . Kidney disease Sister   . Colon cancer Neg Hx   . Esophageal cancer Neg Hx   . Rectal cancer Neg Hx   . Stomach cancer Neg Hx   . Prostate cancer Neg Hx   . Pancreatic cancer Neg Hx    History  Substance Use Topics  . Smoking status: Former Smoker -- 1.00 packs/day for 20 years    Types: Cigarettes    Quit date: 03/15/2015  . Smokeless tobacco: Never Used  . Alcohol Use: 0.0 oz/week    0 Standard drinks or equivalent per week     Comment: rarely    Review of Systems  Constitutional: Positive for unexpected weight change. Negative for fever, chills, diaphoresis, activity change, appetite change and fatigue.  HENT: Negative.  Negative for congestion, facial swelling, rhinorrhea, sore throat, trouble  swallowing and voice change.   Eyes: Negative.  Negative for photophobia, pain, redness and visual disturbance.  Respiratory: Positive for shortness of breath. Negative for apnea, cough, chest tightness, wheezing and stridor.   Cardiovascular: Negative.  Negative for chest pain, palpitations and leg swelling.  Gastrointestinal: Negative.  Negative for nausea, vomiting, abdominal pain, diarrhea, constipation, abdominal distention and anal bleeding.  Endocrine: Negative.  Negative for cold intolerance, polydipsia, polyphagia and polyuria.  Genitourinary: Negative.  Negative for dysuria, urgency, frequency, hematuria and testicular pain.  Musculoskeletal: Negative.  Negative for myalgias, back pain, joint swelling, arthralgias, gait problem, neck pain and  neck stiffness.  Skin: Negative.  Negative for pallor and rash.  Allergic/Immunologic: Negative.   Neurological: Negative.  Negative for dizziness, seizures, syncope, weakness, light-headedness, numbness and headaches.  Hematological: Negative.   Psychiatric/Behavioral: Negative.       Allergies  Aspirin; Ibuprofen; Peanut butter flavor; and Cephalexin  Home Medications   Prior to Admission medications   Medication Sig Start Date End Date Taking? Authorizing Provider  acetaminophen (TYLENOL) 325 MG tablet Take 1-2 tablets (325-650 mg total) by mouth every 6 (six) hours as needed. 05/19/15   Danae Orleans, PA-C  ADVAIR DISKUS 250-50 MCG/DOSE AEPB INHALE 1 PUFF BY MOUTH TWICE DAILY 07/13/15   [name], NP  albuterol (PROAIR HFA) 108 (90 BASE) MCG/ACT inhaler Inhale 2 puffs into the lungs every 6 (six) hours as needed for wheezing. 08/01/13   [name], NP  albuterol (PROVENTIL) (2.5 MG/3ML) 0.083% nebulizer solution Take 3 mLs (2.5 mg total) by nebulization every 6 (six) hours as needed. For shortness of breath and wheezing Patient taking differently: Take 2.5 mg by nebulization every 6 (six) hours as needed for wheezing or shortness of breath.  04/08/13   [name], NP  amLODipine (NORVASC) 5 MG tablet TAKE 1 TABLET(5 MG) BY MOUTH DAILY 06/12/15   [name], NP  benazepril (LOTENSIN) 10 MG tablet TAKE 1 TABLET(10 MG) BY MOUTH DAILY 06/12/15   [name], NP  diphenhydrAMINE (BENADRYL) 25 MG tablet Take 25 mg by mouth at bedtime as needed.    Historical Provider, MD  diphenhydrAMINE (SOMINEX) 25 MG tablet Take 25 mg by mouth at bedtime as needed for sleep.    Historical Provider, MD  docusate sodium (COLACE) 100 MG capsule Take 1 capsule (100 mg total) by mouth 2 (two) times daily. 05/19/15   Danae Orleans, PA-C  ferrous sulfate 325 (65 FE) MG tablet Take 1 tablet (325 mg total) by mouth 3 (three) times daily after meals. 05/20/15   Danae Orleans, PA-C   furosemide (LASIX) 20 MG tablet Take 1 tablet (20 mg total) by mouth daily. Patient taking differently: Take 20 mg by mouth 3 (three) times a week. Wed, Sat, and Sunday. 02/09/15   [name], NP  HYDROmorphone (DILAUDID) 2 MG tablet Take 1 tablet (2 mg total) by mouth at bedtime as needed for severe pain. 05/21/15   Danae Orleans, PA-C  levothyroxine (SYNTHROID, LEVOTHROID) 175 MCG tablet Take 1 tablet (175 mcg total) by mouth daily before breakfast. 04/17/15   [name], NP  lovastatin (MEVACOR) 40 MG tablet TAKE 1 TABLET(40 MG) BY MOUTH AT BEDTIME 07/02/15   [name], NP  methocarbamol (ROBAXIN) 500 MG tablet Take 1 tablet (500 mg total) by mouth every 6 (six) hours as needed for muscle spasms. 05/19/15   Danae Orleans, PA-C  montelukast (SINGULAIR) 10 MG tablet TAKE 1 TABLET BY MOUTH EVERY NIGHT AT BEDTIME Patient taking differently: TAKE 1  TABLET BY MOUTH EVERY MORNING. 01/19/15   [name], NP  nystatin (MYCOSTATIN) 100000 UNIT/ML suspension Take 5 mLs (500,000 Units total) by mouth 4 (four) times daily. 07/20/15   [name], NP  omeprazole (PRILOSEC) 40 MG capsule TAKE 1 CAPSULE BY MOUTH EVERY DAY 07/08/15   [name], NP  oxyCODONE (OXY IR/ROXICODONE) 5 MG immediate release tablet Take 1-3 tablets (5-15 mg total) by mouth every 4 (four) hours as needed for severe pain. 05/19/15   Danae Orleans, PA-C  polyethylene glycol (MIRALAX / GLYCOLAX) packet Take 17 g by mouth 2 (two) times daily. 05/19/15   Danae Orleans, PA-C  rivaroxaban (XARELTO) 10 MG TABS tablet Take 1 tablet (10 mg total) by mouth daily. 05/20/15   Danae Orleans, PA-C  tamsulosin (FLOMAX) 0.4 MG CAPS capsule TAKE 1 CAPSULE(0.4 MG) BY MOUTH AT BEDTIME 07/02/15   [name], NP   BP 127/94 mmHg  Pulse 67  Temp(Src) 98.2 F (36.8 C) (Oral)  Resp 10  Ht 5\' 6"  (1.676 m)  Wt 240 lb (108.863 kg)  BMI 38.76 kg/m2  SpO2 95% Physical Exam  Constitutional: He is oriented to  person, place, and time. He appears well-developed and well-nourished. No distress.  HENT:  Head: Normocephalic and atraumatic.  Mouth/Throat: Oropharynx is clear and moist. No oropharyngeal exudate.  Eyes: Conjunctivae and EOM are normal. Pupils are equal, round, and reactive to light.  Neck: Normal range of motion. Neck supple. No JVD present. No thyromegaly present.  Cardiovascular: Normal rate, regular rhythm, normal heart sounds and intact distal pulses.  Exam reveals no gallop and no friction rub.   No murmur heard. Pulmonary/Chest: Effort normal and breath sounds normal. No respiratory distress. He has no decreased breath sounds. He has no wheezes. He has no rhonchi. He has no rales. He exhibits no tenderness.  Abdominal: Soft. Bowel sounds are normal. He exhibits distension. He exhibits no mass. There is no tenderness. There is no rebound and no guarding.  Musculoskeletal: He exhibits no tenderness.       Right knee: He exhibits decreased range of motion, swelling and erythema. He exhibits no ecchymosis, no deformity and normal alignment. No tenderness found.       Right lower leg: He exhibits swelling and edema. He exhibits no tenderness.  Lymphadenopathy:    He has no cervical adenopathy.  Neurological: He is alert and oriented to person, place, and time. No cranial nerve deficit. He exhibits normal muscle tone.  Skin: Skin is warm and dry. No rash noted. He is not diaphoretic. There is erythema.  Psychiatric: He has a normal mood and affect. His behavior is normal.    ED Course  Procedures (including critical care time) Labs Review Labs Reviewed  BASIC METABOLIC PANEL - Abnormal; Notable for the following:    Chloride 99 (*)    Creatinine, Ser 1.29 (*)    All other components within normal limits  CBC WITH DIFFERENTIAL/PLATELET  BRAIN NATRIURETIC PEPTIDE  TROPONIN I  PROTIME-INR    Imaging Review US Venous Img Lower Unilateral Right  07/20/2015   CLINICAL DATA:  Right  lower extremity pain and edema. History of smoking. Evaluate for DVT.  EXAM: RIGHT LOWER EXTREMITY VENOUS DOPPLER ULTRASOUND  TECHNIQUE: Gray-scale sonography with graded compression, as well as color Doppler and duplex ultrasound were performed to evaluate the lower extremity deep venous systems from the level of the common femoral vein and including the common femoral, femoral, profunda femoral, popliteal and calf veins including the posterior tibial,  peroneal and gastrocnemius veins when visible. The superficial great saphenous vein was also interrogated. Spectral Doppler was utilized to evaluate flow at rest and with distal augmentation maneuvers in the common femoral, femoral and popliteal veins.  COMPARISON:  None.  FINDINGS: Contralateral Common Femoral Vein: Respiratory phasicity is normal and symmetric with the symptomatic side. No evidence of thrombus. Normal compressibility.  Common Femoral Vein: No evidence of thrombus. Normal compressibility, respiratory phasicity and response to augmentation.  Saphenofemoral Junction: No evidence of thrombus. Normal compressibility and flow on color Doppler imaging.  Profunda Femoral Vein: No evidence of thrombus. Normal compressibility and flow on color Doppler imaging.  Femoral Vein: No evidence of thrombus. Normal compressibility, respiratory phasicity and response to augmentation.  Popliteal Vein: No evidence of thrombus. Normal compressibility, respiratory phasicity and response to augmentation.  Calf Veins: No evidence of thrombus. Normal compressibility and flow on color Doppler imaging.  Superficial Great Saphenous Vein: No evidence of thrombus. Normal compressibility and flow on color Doppler imaging.  Venous Reflux:  None.  Other Findings:  None.  IMPRESSION: No evidence of DVT within the right lower extremity.   Electronically Signed   By: Sandi Mariscal M.D.   On: 07/20/2015 15:42     EKG Interpretation   Date/Time:  Monday July 20 2015 14:13:26  EDT Ventricular Rate:  66 PR Interval:  154 QRS Duration: 104 QT Interval:  422 QTC Calculation: 442 R Axis:   54 Text Interpretation:  Normal sinus rhythm Cannot rule out Anterior infarct  , age undetermined Abnormal ECG Diffuse twave flaattening New since  previous tracing Confirmed by JACUBOWITZ  MD, SAM [phone]) on 07/20/2015  2:21:04 PM      MDM   Final diagnoses:  Swelling of right lower extremity  Shortness of breath    Pt. Is a 54 y/o M with hx of Asthma, HTN, HLD, s/p R Knee TKA and here with SOB and Swelling of RLE. He has not improved with albuterol. Lungs predominantly clear. Troponin negative. CBC, WNL. EKG without acute changes at this time. BMET with AKI and CR 1.29 from 0.75. Plan to get CXR for further evaluation of lung fields and ultrasound of RLE for evaluation for DVT.   3:45 pm - BNP - 6.1. CXR read and [place] of LE read pending. Patient signed out to Dr. Doy Mince with plan to await CXR read and Ultrasound read. If DVT is present on Ultrasound, then plan to admit for treatment of likely PE. If negative, then will get CTA to rule out PE. Will hand off care to Dr. Doy Mince at this time.     Aquilla Hacker, MD 07/20/15 1548  Aquilla Hacker, MD 07/20/15 [place], MD 07/20/15 [id number]

## 2015-07-20 NOTE — ED Provider Notes (Signed)
Complains of shortness of breath gradual onset last. Patient also reports increased swelling of his right leg for the past 2 days. Denies fever denies cough denies chest pain. No other associated symptoms. He treated himself with his -year-old inhaler without relief. On exam alert speaks in paragraphs no respiratory distress lungs clear auscultation heart regular rate and rhythm abdomen obese. Right lower extremity well-healed surgical wound over anterior knee. Knee is nontender. Calf is reddened, with 2+ edema. DP pulse 2+. Left lower extremity is not swollen or tender, neurovascularly intact  Orlie Dakin, MD 07/20/15 1513

## 2015-07-20 NOTE — Patient Instructions (Signed)
They are expecting you downstairs in the ED.  Hope you feel better soon!

## 2015-07-20 NOTE — ED Notes (Signed)
Called to CT room by Radiology Staff, pt states unable to lie down for CT scan per EDP orders, explained to pt the importance and need for test in order to properly evaluate and provide the best care to pt. Pt agreed, tolerated procedure well.

## 2015-07-20 NOTE — ED Provider Notes (Signed)
8:10 PM Care assumed from Dr. Winfred Leeds.  [place] was negative for DVT, CTA negative for PE.  IV fluid bolus given due to contrast load (eGFR still greater than 60.)  CT did show many pulmonary nodules concerning for malignancy.  I discussed this in detail with patient and wife.  He understands that he must get close follow up for further testing.  He was also wheezing, so will treat his SOB as mild asthma exacerbation.  Regarding his right leg swelling, his exam is not consistent with septic arthritis or cellulitis.  He has an ortho appointment tomorrow.    Clinical Impression: 1. Swelling of right lower extremity   2. Shortness of breath   3. Pulmonary nodules       Serita Grit, MD 07/20/15 2044

## 2015-07-20 NOTE — Progress Notes (Signed)
Subjective:    Patient ID: Kyle Blair, male    DOB: June 09, 1961, 54 y.o.   MRN: [id number]  HPI   Kyle Blair is a 54 yr old male who presents today with report of worsening asthma symptoms.  Reports associated watery eyes, cough, sputum. He reports "severe thrush." was doing Peak flow meter 450 last night and was 350 this AM.  No relief from his treatments.  Reports "I feel so bad I feel as if I will die if I stay at home tonight."   Pt is s/p R TKA on 05/19/15 with Dr. Alvan Dame. Reports that he was treated with amoxicillin x 10 days for   He scored high on OSA screening tool performed pre-operatively. He has gained 15 pounds since his surgery.  Reports that he snores.  Denies daytime somnolence.  Reports that he wakes up feeling rested.   Wt Readings from Last 3 Encounters:  07/20/15 240 lb (108.863 kg)  05/19/15 225 lb (102.059 kg)  05/14/15 225 lb (102.059 kg)     Review of Systems Past Medical History  Diagnosis Date  . Asthma   . Polycystic kidney disease   . Hypothyroid   . GERD with stricture   . BPH (benign prostatic hypertrophy)   . Hyperlipidemia   . Lung nodule 02/03/2012  . History of stroke 2004  . Stroke   . Arthritis   . Hypertension     History   Social History  . Marital Status: Married    Spouse Name: N/A  . Number of Children: 2  . Years of Education: N/A   Occupational History  . DISABLED    Social History Main Topics  . Smoking status: Former Smoker -- 1.00 packs/day for 20 years    Types: Cigarettes    Quit date: 03/15/2015  . Smokeless tobacco: Never Used  . Alcohol Use: 0.0 oz/week    0 Standard drinks or equivalent per week     Comment: rarely  . Drug Use: No  . Sexual Activity: Not on file   Other Topics Concern  . Not on file   Social History Narrative   Caffeine use:  1 pot coffee daily, 1 glass tea   Regular exercise:  No. Has active lifestyle.   2 biological and 1 stepson.   Former smoker- quit in 2007   He is on  disability- reports that he has a history of heat stroke.               Past Surgical History  Procedure Laterality Date  . Hernia repair  2005    with mesh  . Nasal polyp excision  1992 or 93  . Carpal tunnel release    . Knee arthroscopy Bilateral 12/05/13    cyst and bone spur removed from left knee per pt.  . Colonoscopy    . Polypectomy    . Knee arthroscopy Left 01/2015  . 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 Heart disease Mother     pacemaker  . Diabetes Father   . Kidney disease Father   . Diabetes Sister   . Kidney disease Sister   . Colon cancer Neg Hx   . Esophageal cancer Neg Hx   . Rectal cancer Neg Hx   . Stomach cancer Neg Hx   .  Prostate cancer Neg Hx   . Pancreatic cancer Neg Hx     Allergies  Allergen Reactions  . Aspirin Anaphylaxis  . Ibuprofen Anaphylaxis  . Peanut Butter Flavor Anaphylaxis  . Cephalexin Rash    Current Outpatient Prescriptions on File Prior to Visit  Medication Sig Dispense Refill  . acetaminophen (TYLENOL) 325 MG tablet Take 1-2 tablets (325-650 mg total) by mouth every 6 (six) hours as needed.    Marland Kitchen ADVAIR DISKUS 250-50 MCG/DOSE AEPB INHALE 1 PUFF BY MOUTH TWICE DAILY 60 each 5  . albuterol (PROAIR HFA) 108 (90 BASE) MCG/ACT inhaler Inhale 2 puffs into the lungs every 6 (six) hours as needed for wheezing. 25.5 g 1  . albuterol (PROVENTIL) (2.5 MG/3ML) 0.083% nebulizer solution Take 3 mLs (2.5 mg total) by nebulization every 6 (six) hours as needed. For shortness of breath and wheezing (Patient taking differently: Take 2.5 mg by nebulization every 6 (six) hours as needed for wheezing or shortness of breath. ) 75 mL 1  . amLODipine (NORVASC) 5 MG tablet TAKE 1 TABLET(5 MG) BY MOUTH DAILY 30 tablet 2  . benazepril (LOTENSIN) 10 MG tablet TAKE  1 TABLET(10 MG) BY MOUTH DAILY 30 tablet 2  . diphenhydrAMINE (BENADRYL) 25 MG tablet Take 25 mg by mouth at bedtime as needed.    . diphenhydrAMINE (SOMINEX) 25 MG tablet Take 25 mg by mouth at bedtime as needed for sleep.    Marland Kitchen docusate sodium (COLACE) 100 MG capsule Take 1 capsule (100 mg total) by mouth 2 (two) times daily. 10 capsule 0  . ferrous sulfate 325 (65 FE) MG tablet Take 1 tablet (325 mg total) by mouth 3 (three) times daily after meals.  3  . furosemide (LASIX) 20 MG tablet Take 1 tablet (20 mg total) by mouth daily. (Patient taking differently: Take 20 mg by mouth 3 (three) times a week. Wed, Sat, and Sunday.) 30 tablet 3  . HYDROmorphone (DILAUDID) 2 MG tablet Take 1 tablet (2 mg total) by mouth at bedtime as needed for severe pain. 6 tablet 0  . levothyroxine (SYNTHROID, LEVOTHROID) 175 MCG tablet Take 1 tablet (175 mcg total) by mouth daily before breakfast. 30 tablet 2  . lovastatin (MEVACOR) 40 MG tablet TAKE 1 TABLET(40 MG) BY MOUTH AT BEDTIME 90 tablet 1  . methocarbamol (ROBAXIN) 500 MG tablet Take 1 tablet (500 mg total) by mouth every 6 (six) hours as needed for muscle spasms. 50 tablet 0  . montelukast (SINGULAIR) 10 MG tablet TAKE 1 TABLET BY MOUTH EVERY NIGHT AT BEDTIME (Patient taking differently: TAKE 1 TABLET BY MOUTH EVERY MORNING.) 90 tablet 1  . nystatin (MYCOSTATIN) 100000 UNIT/ML suspension Take 5 mLs (500,000 Units total) by mouth 4 (four) times daily. 240 mL 0  . omeprazole (PRILOSEC) 40 MG capsule TAKE 1 CAPSULE BY MOUTH EVERY DAY 90 capsule 1  . oxyCODONE (OXY IR/ROXICODONE) 5 MG immediate release tablet Take 1-3 tablets (5-15 mg total) by mouth every 4 (four) hours as needed for severe pain. 120 tablet 0  . polyethylene glycol (MIRALAX / GLYCOLAX) packet Take 17 g by mouth 2 (two) times daily. 14 each 0  . rivaroxaban (XARELTO) 10 MG TABS tablet Take 1 tablet (10 mg total) by mouth daily. 14 tablet 0  . tamsulosin (FLOMAX) 0.4 MG CAPS capsule TAKE 1  CAPSULE(0.4 MG) BY MOUTH AT BEDTIME 90 capsule 1   No current facility-administered medications on file prior to visit.    BP 126/84 mmHg  Pulse 72  Temp(Src) 97.8 F (36.6 C) (Oral)  Resp 16  Ht 5\' 5"  (1.651 m)  Wt 240 lb (108.863 kg)  BMI 39.94 kg/m2  SpO2 94%       Objective:   Physical Exam  Constitutional: He is oriented to person, place, and time. He appears well-developed. He appears lethargic. He appears ill.  + facial edema noted. Pt is pale  HENT:  Head: Normocephalic and atraumatic.  Right Ear: Tympanic membrane and ear canal normal.  Left Ear: Tympanic membrane and ear canal normal.  Red irritated tongue  Eyes: No scleral icterus.  Cardiovascular: Normal rate and regular rhythm.   No murmur heard. Pulmonary/Chest:  Increased WOB, poor air movement noted with some expiratory wheezes.  Musculoskeletal:  RLE swelling is noted- 3-4+ LLE swelling is 2+  Neurological: He is oriented to person, place, and time. He appears lethargic.  Skin: Skin is warm and dry.          Assessment & Plan:  Asthma/CHF exacerbation (hx of grade1 diastolic dysfunction on 2D echo).  54 yr old male with hx of asthma presents with increased SOB, not responding to albuterol nebs. Now with significant RLE swelling concerning for DVT.  PE is in differential.  He also appears significantly volume overloaded. Advised pt that I think he should be evaluated in the ED where he can be given steroids/lasix and have further evaluation for possible RLE DVT.  He is agreeable to proceed to the ED. Report given to [facility]- charge nurse in [place] ED.    Will rx with nystatin for oral thrush.

## 2015-07-20 NOTE — ED Notes (Signed)
Pt c/o feeling SOB that began last night. Pt sts he has taken 4 aerosol tx but they are not helping. Pt also c/o leg swelling.

## 2015-07-20 NOTE — ED Notes (Signed)
Pt states he has acute weight increase as well

## 2015-07-20 NOTE — ED Notes (Signed)
12 lead EKG done 

## 2015-07-20 NOTE — ED Notes (Signed)
MD at bedside. 

## 2015-07-20 NOTE — ED Notes (Signed)
Patient transported to Ultrasound, via stretcher, sr x 2 up

## 2015-07-20 NOTE — ED Notes (Signed)
Pt requesting prednisone. MD made aware.

## 2015-07-20 NOTE — Progress Notes (Signed)
Pre visit review using our clinic review tool, if applicable. No additional management support is needed unless otherwise documented below in the visit note. 

## 2015-07-20 NOTE — ED Notes (Signed)
Pt still cont to c/o difficulty breathing "I have been here for hours and no body has done anything for me", Charge RN to see pt and this RN explained all care and test being done to ensure a proper plan of care is being provided. RT in to evaluate pt also.

## 2015-07-20 NOTE — ED Notes (Signed)
Family at bedside. 

## 2015-07-20 NOTE — ED Notes (Signed)
Pt concerned about his asthma.  Pt wanting a shot of steroids and something for his congestion.  No sob noted.  No wheezing noted.  No audible congestion.  Chest xray normal.  Informed pt that I would send respiratory therapist in the room to listen to his lungs.  Pt did not understand why he was here for 4 hours and that we had not found out why he was sob.  Informed pt that the CT scan was pending.

## 2015-07-20 NOTE — ED Notes (Signed)
Pt presents with SOB and light headiness

## 2015-07-21 ENCOUNTER — Telehealth: Payer: Self-pay | Admitting: Family

## 2015-07-21 DIAGNOSIS — R918 Other nonspecific abnormal finding of lung field: Secondary | ICD-10-CM

## 2015-07-21 NOTE — Telephone Encounter (Signed)
Spoke with pt. He states he received a prednisone Rx from ER and it seems to be helping his breathing some. Pt is very concerned about his ?fluid gain. Has only been taking furosemide 20mg , three times a week. Doesn't feel he is urinating enough. Also states he wants to hold off on PET scan. 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  Scheduled pt f/u with PCP tomorrow at 8:45am. Pt wants to know if furosemide dose should be adjusted.  Please advise.

## 2015-07-21 NOTE — Telephone Encounter (Signed)
Please contact pt and let him know that I reviewed his ED records and I would like for him to get a PET scan to further evaluate the pulmonary nodules noted on his CT scan.  Lets bring him in on Monday at 9:30 for ED follow up (ok to put two 15 min apts together).  How is he breathing today?  If he is not feeling better we should bring him in sooner.

## 2015-07-21 NOTE — Telephone Encounter (Signed)
Left message with pt's spouse to have pt return my call.

## 2015-07-21 NOTE — Telephone Encounter (Signed)
Per verbal from PCP, advised pt to take furosemide 40mg  today and tomorrow and keep appt for 8:45 tomorrow. Pt voices understanding.

## 2015-07-22 ENCOUNTER — Ambulatory Visit (INDEPENDENT_AMBULATORY_CARE_PROVIDER_SITE_OTHER): Payer: Medicare Other | Admitting: Family

## 2015-07-22 ENCOUNTER — Encounter: Payer: Self-pay | Admitting: Family

## 2015-07-22 ENCOUNTER — Telehealth: Payer: Self-pay | Admitting: Family

## 2015-07-22 VITALS — BP 120/76 | HR 72 | Temp 97.9°F | Resp 16 | Ht 65.0 in | Wt 235.4 lb

## 2015-07-22 DIAGNOSIS — J4521 Mild intermittent asthma with (acute) exacerbation: Secondary | ICD-10-CM

## 2015-07-22 DIAGNOSIS — R918 Other nonspecific abnormal finding of lung field: Secondary | ICD-10-CM

## 2015-07-22 DIAGNOSIS — J984 Other disorders of lung: Secondary | ICD-10-CM | POA: Diagnosis not present

## 2015-07-22 MED ORDER — POTASSIUM CHLORIDE CRYS ER 20 MEQ PO TBCR: 20.0000 meq | EXTENDED_RELEASE_TABLET | Freq: Every day | ORAL | Status: DC

## 2015-07-22 MED ORDER — ALPRAZOLAM 0.25 MG PO TABS: 0.2500 mg | ORAL_TABLET | Freq: Two times a day (BID) | ORAL | Status: DC | PRN

## 2015-07-22 NOTE — Telephone Encounter (Signed)
Wife updated. She requests med for pt for anxiety.  Please fax rx to pharmacy.

## 2015-07-22 NOTE — Patient Instructions (Addendum)
You will be contacted about your PET scan. Let me know if you have not been contacted about scheduling PET scan by Friday. Continue lasix 40mg  today and tomorrow and then continue lasix 20mg  once daily until we see you back in the office. Add Kdur 30mEQ once daily (potassium supplement).

## 2015-07-22 NOTE — Telephone Encounter (Signed)
Contacted number on file. No answer. Left message that I would try again later.

## 2015-07-22 NOTE — Progress Notes (Signed)
Subjective:    Patient ID: Kyle Blair, male    DOB: 08-10-1961, 54 y.o.   MRN: [id number]  HPI   Mr. Revolorio is a 54 yr old male who presents today for ED follow up.  He was seen in the office on 07/20/15 and had SOB, volume overload.  Was sent to the ED. He had RLE doppler to evaluate swelling which was negative for DVT.  CTA chest was performed which was neg for PE but noted innumerable new pulmonary nodules.  Note was made of new mediastinal and right hilar adenopathy which was concerning for metastatinc disease.   Wt Readings from Last 3 Encounters:  07/22/15 235 lb 6.4 oz (106.777 kg)  07/20/15 240 lb (108.863 kg)  07/20/15 240 lb (108.863 kg)   The patient was sent home with a prednisone taper.  Yesterday he started prednisone for volume overload. Reports slight improvement in his breathing.      Review of Systems    see HPI  Past Medical History  Diagnosis Date  . Asthma   . Polycystic kidney disease   . Hypothyroid   . GERD with stricture   . BPH (benign prostatic hypertrophy)   . Hyperlipidemia   . Lung nodule 02/03/2012  . History of stroke 2004  . Stroke   . Arthritis   . Hypertension     History   Social History  . Marital Status: Married    Spouse Name: N/A  . Number of Children: 2  . Years of Education: N/A   Occupational History  . DISABLED    Social History Main Topics  . Smoking status: Former Smoker -- 1.00 packs/day for 20 years    Types: Cigarettes    Quit date: 03/15/2015  . Smokeless tobacco: Never Used  . Alcohol Use: 0.0 oz/week    0 Standard drinks or equivalent per week     Comment: rarely  . Drug Use: No  . Sexual Activity: Not on file   Other Topics Concern  . Not on file   Social History Narrative   Caffeine use:  1 pot coffee daily, 1 glass tea   Regular exercise:  No. Has active lifestyle.   2 biological and 1 stepson.   Former smoker- quit in 2007   He is on disability- reports that he has a history of heat stroke.                Past Surgical History  Procedure Laterality Date  . Hernia repair  2005    with mesh  . Nasal polyp excision  1992 or 93  . Carpal tunnel release    . Knee arthroscopy Bilateral 12/05/13    cyst and bone spur removed from left knee per pt.  . Colonoscopy    . Polypectomy    . Knee arthroscopy Left 01/2015  . 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 Heart disease Mother     pacemaker  . Diabetes Father   . Kidney disease Father   . Diabetes Sister   . Kidney disease Sister   . Colon cancer Neg Hx   . Esophageal cancer Neg Hx   . Rectal cancer Neg Hx   . Stomach cancer Neg Hx   . Prostate cancer Neg Hx   . Pancreatic  cancer Neg Hx     Allergies  Allergen Reactions  . Aspirin Anaphylaxis  . Ibuprofen Anaphylaxis  . Peanut Butter Flavor Anaphylaxis  . Cephalexin Rash    Current Outpatient Prescriptions on File Prior to Visit  Medication Sig Dispense Refill  . acetaminophen (TYLENOL) 325 MG tablet Take 1-2 tablets (325-650 mg total) by mouth every 6 (six) hours as needed.    Marland Kitchen ADVAIR DISKUS 250-50 MCG/DOSE AEPB INHALE 1 PUFF BY MOUTH TWICE DAILY 60 each 5  . albuterol (PROAIR HFA) 108 (90 BASE) MCG/ACT inhaler Inhale 2 puffs into the lungs every 6 (six) hours as needed for wheezing. 25.5 g 1  . albuterol (PROVENTIL) (2.5 MG/3ML) 0.083% nebulizer solution Take 3 mLs (2.5 mg total) by nebulization every 6 (six) hours as needed. For shortness of breath and wheezing (Patient taking differently: Take 2.5 mg by nebulization every 6 (six) hours as needed for wheezing or shortness of breath. ) 75 mL 1  . amLODipine (NORVASC) 5 MG tablet TAKE 1 TABLET(5 MG) BY MOUTH DAILY 30 tablet 2  . benazepril (LOTENSIN) 10 MG tablet TAKE 1 TABLET(10 MG) BY MOUTH DAILY 30 tablet 2  .  diphenhydrAMINE (BENADRYL) 25 MG tablet Take 25 mg by mouth at bedtime as needed.    . diphenhydrAMINE (SOMINEX) 25 MG tablet Take 25 mg by mouth at bedtime as needed for sleep.    Marland Kitchen docusate sodium (COLACE) 100 MG capsule Take 1 capsule (100 mg total) by mouth 2 (two) times daily. 10 capsule 0  . ferrous sulfate 325 (65 FE) MG tablet Take 1 tablet (325 mg total) by mouth 3 (three) times daily after meals.  3  . furosemide (LASIX) 20 MG tablet Take 1 tablet (20 mg total) by mouth daily. (Patient taking differently: Take 40 mg by mouth daily. ) 30 tablet 3  . HYDROmorphone (DILAUDID) 2 MG tablet Take 1 tablet (2 mg total) by mouth at bedtime as needed for severe pain. 6 tablet 0  . levothyroxine (SYNTHROID, LEVOTHROID) 175 MCG tablet Take 1 tablet (175 mcg total) by mouth daily before breakfast. 30 tablet 2  . lovastatin (MEVACOR) 40 MG tablet TAKE 1 TABLET(40 MG) BY MOUTH AT BEDTIME 90 tablet 1  . methocarbamol (ROBAXIN) 500 MG tablet Take 1 tablet (500 mg total) by mouth every 6 (six) hours as needed for muscle spasms. 50 tablet 0  . montelukast (SINGULAIR) 10 MG tablet TAKE 1 TABLET BY MOUTH EVERY NIGHT AT BEDTIME (Patient taking differently: TAKE 1 TABLET BY MOUTH EVERY MORNING.) 90 tablet 1  . nystatin (MYCOSTATIN) 100000 UNIT/ML suspension Take 5 mLs (500,000 Units total) by mouth 4 (four) times daily. 240 mL 0  . omeprazole (PRILOSEC) 40 MG capsule TAKE 1 CAPSULE BY MOUTH EVERY DAY 90 capsule 1  . polyethylene glycol (MIRALAX / GLYCOLAX) packet Take 17 g by mouth 2 (two) times daily. 14 each 0  . predniSONE (DELTASONE) 10 MG tablet Take 6 tablets (60 mg total) by mouth daily. 30 tablet 0  . rivaroxaban (XARELTO) 10 MG TABS tablet Take 1 tablet (10 mg total) by mouth daily. 14 tablet 0  . tamsulosin (FLOMAX) 0.4 MG CAPS capsule TAKE 1 CAPSULE(0.4 MG) BY MOUTH AT BEDTIME 90 capsule 1   No current facility-administered medications on file prior to visit.    BP 120/76 mmHg  Pulse 72   Temp(Src) 97.9 F (36.6 C) (Oral)  Resp 16  Ht 5\' 5"  (1.651 m)  Wt 235 lb 6.4 oz (106.777 kg)  BMI  39.17 kg/m2  SpO2 94%    Objective:   Physical Exam  Constitutional: He is oriented to person, place, and time. He appears well-developed and well-nourished. No distress.  Some facial swelling is again noted  HENT:  Head: Normocephalic and atraumatic.  Cardiovascular: Normal rate and regular rhythm.   No murmur heard. RLE swelling is slightly improved.    Pulmonary/Chest: Effort normal. No respiratory distress. He has no rales.  + Wheeze bilaterally, diminished air movement throughout  Neurological: He is alert and oriented to person, place, and time.  Skin: Skin is warm and dry.  Psychiatric: His behavior is normal. Thought content normal.  Tearful upon discussion of CT results          Assessment & Plan:

## 2015-07-22 NOTE — Telephone Encounter (Signed)
Please call Kyle Blair with details from visit today as pt had a previous stroke and doesn't remember very well. Advised, per Gilmore Laroche, to expect call between 11:00am-12:00pm.

## 2015-07-22 NOTE — Telephone Encounter (Signed)
Rx faxed to [facility].

## 2015-07-23 DIAGNOSIS — R918 Other nonspecific abnormal finding of lung field: Secondary | ICD-10-CM | POA: Insufficient documentation

## 2015-07-23 NOTE — Assessment & Plan Note (Signed)
Discussed with pt that these could represent metastatic disease. Advised pt to follow through with PET scan.  He is agreeable. Support provided.

## 2015-07-23 NOTE — Assessment & Plan Note (Signed)
Continue prednisone, advair, albuterol, singular.  Improving slowly.    Also for volume overload- pt is instructed to take lasix 40mg  once daily x 3 days, then 20mg  once daily and add kdur 37meq once daily.

## 2015-07-24 ENCOUNTER — Telehealth: Payer: Self-pay | Admitting: Family

## 2015-07-24 ENCOUNTER — Telehealth: Payer: Self-pay | Admitting: *Deleted

## 2015-07-24 MED ORDER — ALBUTEROL SULFATE (2.5 MG/3ML) 0.083% IN NEBU: 2.5000 mg | INHALATION_SOLUTION | Freq: Four times a day (QID) | RESPIRATORY_TRACT | Status: DC | PRN

## 2015-07-24 NOTE — Telephone Encounter (Signed)
Relation to QU:IVHO Call back number: [phone] Pharmacy: [facility], [place] - [address] [phone] (Phone) [phone] (Fax)         Reason for call:  Patient requested albuterol (PROVENTIL) (2.5 MG/3ML) 0.083% nebulizer solution  please send to

## 2015-07-24 NOTE — Telephone Encounter (Signed)
Pt's spouse requests refill of albuterol nebulizer to [facility]. Refill sent.

## 2015-07-24 NOTE — Telephone Encounter (Signed)
Previous refill sent to [facility] in error. Cancelled rx at [facility] and re-sent to [facility]. Notified pt's spouse.

## 2015-07-26 ENCOUNTER — Encounter (HOSPITAL_COMMUNITY): Payer: Self-pay | Admitting: *Deleted

## 2015-07-26 ENCOUNTER — Emergency Department (HOSPITAL_COMMUNITY)
Admission: EM | Admit: 2015-07-26 | Discharge: 2015-07-27 | Disposition: A | Discharge status: Home / Self Care | Payer: Medicare Other | Attending: Emergency Medicine | Admitting: Emergency Medicine

## 2015-07-26 ENCOUNTER — Emergency Department (HOSPITAL_COMMUNITY): Payer: Medicare Other

## 2015-07-26 DIAGNOSIS — K222 Esophageal obstruction: Secondary | ICD-10-CM | POA: Diagnosis not present

## 2015-07-26 DIAGNOSIS — Z79899 Other long term (current) drug therapy: Secondary | ICD-10-CM | POA: Diagnosis not present

## 2015-07-26 DIAGNOSIS — E039 Hypothyroidism, unspecified: Secondary | ICD-10-CM | POA: Diagnosis not present

## 2015-07-26 DIAGNOSIS — Z7901 Long term (current) use of anticoagulants: Secondary | ICD-10-CM | POA: Diagnosis not present

## 2015-07-26 DIAGNOSIS — R531 Weakness: Secondary | ICD-10-CM | POA: Insufficient documentation

## 2015-07-26 DIAGNOSIS — M1990 Unspecified osteoarthritis, unspecified site: Secondary | ICD-10-CM | POA: Diagnosis not present

## 2015-07-26 DIAGNOSIS — Z87891 Personal history of nicotine dependence: Secondary | ICD-10-CM | POA: Diagnosis not present

## 2015-07-26 DIAGNOSIS — J4521 Mild intermittent asthma with (acute) exacerbation: Secondary | ICD-10-CM | POA: Diagnosis not present

## 2015-07-26 DIAGNOSIS — Q613 Polycystic kidney, unspecified: Secondary | ICD-10-CM | POA: Diagnosis not present

## 2015-07-26 DIAGNOSIS — Z7952 Long term (current) use of systemic steroids: Secondary | ICD-10-CM | POA: Insufficient documentation

## 2015-07-26 DIAGNOSIS — Z8673 Personal history of transient ischemic attack (TIA), and cerebral infarction without residual deficits: Secondary | ICD-10-CM | POA: Diagnosis not present

## 2015-07-26 DIAGNOSIS — R0602 Shortness of breath: Secondary | ICD-10-CM | POA: Diagnosis present

## 2015-07-26 DIAGNOSIS — I10 Essential (primary) hypertension: Secondary | ICD-10-CM | POA: Insufficient documentation

## 2015-07-26 DIAGNOSIS — N400 Enlarged prostate without lower urinary tract symptoms: Secondary | ICD-10-CM | POA: Diagnosis not present

## 2015-07-26 LAB — I-STAT CHEM 8, ED
BUN: 15 mg/dL (ref 6–20)
CHLORIDE: 98 mmol/L — AB (ref 101–111)
Calcium, Ion: 1.11 mmol/L — ABNORMAL LOW (ref 1.12–1.23)
Creatinine, Ser: 1.1 mg/dL (ref 0.61–1.24)
Glucose, Bld: 96 mg/dL (ref 65–99)
HCT: 46 % (ref 39.0–52.0)
HEMOGLOBIN: 15.6 g/dL (ref 13.0–17.0)
POTASSIUM: 3.1 mmol/L — AB (ref 3.5–5.1)
Sodium: 138 mmol/L (ref 135–145)
TCO2: 27 mmol/L (ref 0–100)

## 2015-07-26 LAB — CBC WITH DIFFERENTIAL/PLATELET
BASOS ABS: 0 10*3/uL (ref 0.0–0.1)
Basophils Relative: 0 % (ref 0–1)
EOS PCT: 0 % (ref 0–5)
Eosinophils Absolute: 0 10*3/uL (ref 0.0–0.7)
HEMATOCRIT: 42.7 % (ref 39.0–52.0)
Hemoglobin: 14.6 g/dL (ref 13.0–17.0)
LYMPHS ABS: 0.9 10*3/uL (ref 0.7–4.0)
LYMPHS PCT: 14 % (ref 12–46)
MCH: 30.5 pg (ref 26.0–34.0)
MCHC: 34.2 g/dL (ref 30.0–36.0)
MCV: 89.3 fL (ref 78.0–100.0)
MONO ABS: 0.5 10*3/uL (ref 0.1–1.0)
MONOS PCT: 7 % (ref 3–12)
Neutro Abs: 4.9 10*3/uL (ref 1.7–7.7)
Neutrophils Relative %: 79 % — ABNORMAL HIGH (ref 43–77)
Platelets: 255 10*3/uL (ref 150–400)
RBC: 4.78 MIL/uL (ref 4.22–5.81)
RDW: 14.5 % (ref 11.5–15.5)
WBC: 6.3 10*3/uL (ref 4.0–10.5)

## 2015-07-26 LAB — I-STAT TROPONIN, ED: Troponin i, poc: 0 ng/mL (ref 0.00–0.08)

## 2015-07-26 LAB — BRAIN NATRIURETIC PEPTIDE: B Natriuretic Peptide: 6.5 pg/mL (ref 0.0–100.0)

## 2015-07-26 MED ORDER — POTASSIUM CHLORIDE CRYS ER 20 MEQ PO TBCR
40.0000 meq | EXTENDED_RELEASE_TABLET | Freq: Once | ORAL | Status: AC
  Administered 2015-07-26: 40 meq via ORAL
  Filled 2015-07-26: qty 2

## 2015-07-26 MED ORDER — DEXAMETHASONE SODIUM PHOSPHATE 10 MG/ML IJ SOLN
10.0000 mg | Freq: Once | INTRAMUSCULAR | Status: AC
  Administered 2015-07-26: 10 mg via INTRAMUSCULAR
  Filled 2015-07-26: qty 1

## 2015-07-26 MED ORDER — IPRATROPIUM BROMIDE 0.02 % IN SOLN
0.5000 mg | Freq: Once | RESPIRATORY_TRACT | Status: AC
  Administered 2015-07-26: 0.5 mg via RESPIRATORY_TRACT
  Filled 2015-07-26: qty 2.5

## 2015-07-26 MED ORDER — ALBUTEROL SULFATE (2.5 MG/3ML) 0.083% IN NEBU
5.0000 mg | INHALATION_SOLUTION | Freq: Once | RESPIRATORY_TRACT | Status: AC
  Administered 2015-07-26: 5 mg via RESPIRATORY_TRACT
  Filled 2015-07-26: qty 6

## 2015-07-26 MED ORDER — OXYCODONE-ACETAMINOPHEN 5-325 MG PO TABS
1.0000 | ORAL_TABLET | Freq: Once | ORAL | Status: AC
  Administered 2015-07-26: 1 via ORAL
  Filled 2015-07-26: qty 1

## 2015-07-26 MED ORDER — PREDNISONE 20 MG PO TABS: ORAL_TABLET | ORAL | Status: DC

## 2015-07-26 NOTE — ED Provider Notes (Signed)
CSN: [id number]     Arrival date & time 07/26/15  1947 History   First MD Initiated Contact with Patient 07/26/15 2201     Chief Complaint  Patient presents with  . Shortness of Breath  . Weakness     (Consider location/radiation/quality/duration/timing/severity/associated sxs/prior Treatment) HPI   54 year old male with history of asthma, polycystic kidney disease, BPH, lung nodule, history of stroke, hypertension, presenting for evaluation of shortness of breath. Patient report for more than a week he has been having chest tightness, difficulty taking a full breath, generalized weakness, night sweats, abnormal weight gain, and increase bilateral leg swelling. He was seen in ED last week for shortness of breath. He also complaining of leg swelling. Doppler study of his lower extremities negative for DVT. A chest CTA performed showing no evidence of PE. He however had multiple pulmonary nodules concerning for malignancy. No evidence of infection at that time. Patient was discharged with steroid which which he has been taking for the past several days. He is scheduled to have a PET scan on August 8. Patient report normally when he takes prednisone, it usually causes him to be energetic and he is able to perform a lot of activities. However, with this particular course of steroid he continued to endorse generalized weakness, decreased appetite and still having chest tightness. He has been using his breathing treatment 4 times today without adequate relief. He is scheduled to be seen by his PCP in 3 days. Patient however request additional steroid as it has helped him in the past. He denies any prior history of cancer. Patient is a former smoker. He has right knee surgery back in May and still has residual pain to his right knee. He is requesting for pain medication. Patient currently denies productive cough, hemoptysis, active chest pain, abdominal pain, focal numbness or weakness, dysuria, or rash.  Patient also requesting for the phone number of our PET scan facility.    Past Medical History  Diagnosis Date  . Asthma   . Polycystic kidney disease   . Hypothyroid   . GERD with stricture   . BPH (benign prostatic hypertrophy)   . Hyperlipidemia   . Lung nodule 02/03/2012  . History of stroke 2004  . Stroke   . Arthritis   . Hypertension    Past Surgical History  Procedure Laterality Date  . Hernia repair  2005    with mesh  . Nasal polyp excision  1992 or 93  . Carpal tunnel release    . Knee arthroscopy Bilateral 12/05/13    cyst and bone spur removed from left knee per pt.  . Colonoscopy    . Polypectomy    . Knee arthroscopy Left 01/2015  . 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 Heart disease Mother     pacemaker  . Diabetes Father   . Kidney disease Father   . Diabetes Sister   . Kidney disease Sister   . Colon cancer Neg Hx   . Esophageal cancer Neg Hx   . Rectal cancer Neg Hx   . Stomach cancer Neg Hx   . Prostate cancer Neg Hx   . Pancreatic cancer Neg Hx    History  Substance Use Topics  . Smoking status: Former Smoker -- 1.00 packs/day for 20 years  Types: Cigarettes    Quit date: 03/15/2015  . Smokeless tobacco: Never Used  . Alcohol Use: 0.0 oz/week    0 Standard drinks or equivalent per week     Comment: rarely    Review of Systems  All other systems reviewed and are negative.     Allergies  Aspirin; Ibuprofen; Peanut butter flavor; and Cephalexin  Home Medications   Prior to Admission medications   Medication Sig Start Date End Date Taking? Authorizing Provider  acetaminophen (TYLENOL) 325 MG tablet Take 1-2 tablets (325-650 mg total) by mouth every 6 (six) hours as needed. 05/19/15   Danae Orleans, PA-C  ADVAIR DISKUS 250-50 MCG/DOSE AEPB INHALE 1  PUFF BY MOUTH TWICE DAILY 07/13/15   [name], NP  albuterol (PROVENTIL) (2.5 MG/3ML) 0.083% nebulizer solution Take 3 mLs (2.5 mg total) by nebulization every 6 (six) hours as needed for wheezing or shortness of breath. 07/24/15   [name], NP  ALPRAZolam Duanne Moron) 0.25 MG tablet Take 1 tablet (0.25 mg total) by mouth 2 (two) times daily as needed for anxiety. 07/22/15   [name], NP  amLODipine (NORVASC) 5 MG tablet TAKE 1 TABLET(5 MG) BY MOUTH DAILY 06/12/15   [name], NP  benazepril (LOTENSIN) 10 MG tablet TAKE 1 TABLET(10 MG) BY MOUTH DAILY 06/12/15   [name], NP  diphenhydrAMINE (BENADRYL) 25 MG tablet Take 25 mg by mouth at bedtime as needed.    Historical Provider, MD  docusate sodium (COLACE) 100 MG capsule Take 1 capsule (100 mg total) by mouth 2 (two) times daily. 05/19/15   Danae Orleans, PA-C  ferrous sulfate 325 (65 FE) MG tablet Take 1 tablet (325 mg total) by mouth 3 (three) times daily after meals. 05/20/15   Danae Orleans, PA-C  furosemide (LASIX) 20 MG tablet Take 1 tablet (20 mg total) by mouth daily. Patient taking differently: Take 40 mg by mouth daily.  02/09/15   [name], NP  HYDROmorphone (DILAUDID) 2 MG tablet Take 1 tablet (2 mg total) by mouth at bedtime as needed for severe pain. 05/21/15   Danae Orleans, PA-C  levothyroxine (SYNTHROID, LEVOTHROID) 175 MCG tablet Take 1 tablet (175 mcg total) by mouth daily before breakfast. 04/17/15   [name], NP  lovastatin (MEVACOR) 40 MG tablet TAKE 1 TABLET(40 MG) BY MOUTH AT BEDTIME 07/02/15   [name], NP  methocarbamol (ROBAXIN) 500 MG tablet Take 1 tablet (500 mg total) by mouth every 6 (six) hours as needed for muscle spasms. 05/19/15   Danae Orleans, PA-C  montelukast (SINGULAIR) 10 MG tablet TAKE 1 TABLET BY MOUTH EVERY NIGHT AT BEDTIME Patient taking differently: TAKE 1 TABLET BY MOUTH EVERY MORNING. 01/19/15   [name], NP  nystatin  (MYCOSTATIN) 100000 UNIT/ML suspension Take 5 mLs (500,000 Units total) by mouth 4 (four) times daily. 07/20/15   [name], NP  omeprazole (PRILOSEC) 40 MG capsule TAKE 1 CAPSULE BY MOUTH EVERY DAY 07/08/15   [name], NP  polyethylene glycol (MIRALAX / GLYCOLAX) packet Take 17 g by mouth 2 (two) times daily. 05/19/15   Danae Orleans, PA-C  potassium chloride SA (K-DUR,KLOR-CON) 20 MEQ tablet Take 1 tablet (20 mEq total) by mouth daily. 07/22/15   [name], NP  predniSONE (DELTASONE) 10 MG tablet Take 6 tablets (60 mg total) by mouth daily. 07/20/15   Serita Grit, MD  rivaroxaban (XARELTO) 10 MG TABS tablet Take 1 tablet (10 mg total) by mouth daily. 05/20/15   Danae Orleans, PA-C  tamsulosin [facility])  0.4 MG CAPS capsule TAKE 1 CAPSULE(0.4 MG) BY MOUTH AT BEDTIME 07/02/15   [name], NP   BP 114/78 mmHg  Pulse 75  Temp(Src) 98.1 F (36.7 C) (Oral)  Resp 16  SpO2 96% Physical Exam  Constitutional: He is oriented to person, place, and time. He appears well-developed and well-nourished. No distress.  Caucasian male, sitting in bed, appears to be in no acute distress, speaking in complete sentences.  HENT:  Head: Atraumatic.  Mouth/Throat: Oropharynx is clear and moist.  Eyes: Conjunctivae are normal.  Neck: Normal range of motion. Neck supple. No JVD present.  Cardiovascular: Normal rate and regular rhythm.   Pulmonary/Chest: He has wheezes (Faint expiratory wheezes.). He has no rales. He exhibits no tenderness.  Abdominal: Soft. There is no tenderness.  Musculoskeletal: He exhibits edema (bilateral pitting edema.) and tenderness (Right knee with diffuse tenderness to anterior knee on palpation. Normal knee flexion and extension. Normal surgical scar. No erythema or warmth.).  Lymphadenopathy:    He has no cervical adenopathy.  Neurological: He is alert and oriented to person, place, and time.  Skin: No rash noted.  Psychiatric: He has a normal mood and  affect.  Nursing note and vitals reviewed.   ED Course  Procedures (including critical care time)  10:57 PM Patient presents with subjective fever, night sweats, unexplained weight changes, and prior chest CTA showing multiple nodule in his lung concerning for metastatic disease. He also has history of asthma and presenting with mild wheezing. He is not hypoxic and does not look toxic. He is afebrile with stable normal vital sign. Patient will benefit from a course of dual nebs including steroid. Pain medication given for his knee pain. Knee pain does not appears to be a septic joint. He has had prior DVT and PE workup 5 days ago that was unremarkable.  11:50 PM Patient's labs are reassuring. Mild hypokalemia with potassium of 3.1, supplementation given here. EKG shows no concerning acute changes. Normal troponin. Normal BMP, no evidence of congestive heart failure. Repeat chest x-ray is without any acute finding concerning for infection. After receiving breathing treatment, patient felt much better. He ambulate while maintaining adequate oxygenation. Patient will be discharge with course steroid. He also request number to follow-up with radiology for PET scan. Number given. Return precautions discussed.  Labs Review Labs Reviewed  CBC WITH DIFFERENTIAL/PLATELET - Abnormal; Notable for the following:    Neutrophils Relative % 79 (*)    All other components within normal limits  I-STAT CHEM 8, ED - Abnormal; Notable for the following:    Potassium 3.1 (*)    Chloride 98 (*)    Calcium, Ion 1.11 (*)    All other components within normal limits  BRAIN NATRIURETIC PEPTIDE  I-STAT TROPOININ, ED    Imaging Review Dg Chest 2 View  07/26/2015   CLINICAL DATA:  Dyspnea for over a week.  EXAM: CHEST  2 VIEW  COMPARISON:  CT 07/20/2015, radiographs 07/20/2015  FINDINGS: There is mild linear basilar atelectasis bilaterally. The lungs are otherwise clear. Hilar and mediastinal contours are  unremarkable and unchanged. There is no pleural effusion.  IMPRESSION: Mild linear basilar atelectasis.   Electronically Signed   By: Andreas Newport M.D.   On: 07/26/2015 22:44     EKG Interpretation   Date/Time:  Sunday July 26 2015 23:07:48 EDT Ventricular Rate:  62 PR Interval:  150 QRS Duration: 107 QT Interval:  441 QTC Calculation: 448 R Axis:   39 Text Interpretation:  Sinus rhythm since last tracing no significant  change Confirmed by [facility]  MD, ELLIOTT [phone]) on 07/26/2015 11:23:10 PM      MDM   Final diagnoses:  Asthma exacerbation attacks, mild intermittent    BP 117/81 mmHg  Pulse 69  Temp(Src) 98.1 F (36.7 C) (Oral)  Resp 18  SpO2 96%  I have reviewed nursing notes and vital signs. I personally viewed the imaging tests through PACS system and agrees with radiologist's intepretation I reviewed available ER/hospitalization records through the EMR \    Domenic Moras, PA-C 07/26/15 [place], MD 07/27/15 [phone]

## 2015-07-26 NOTE — ED Notes (Addendum)
Pt complains of shortness of breath for more than a week. Pt was seen at the ED last week, had a CT of his chest that showed new nodules in his lungs. Pt states his breathing has deteriorated over the past week. Pt is scheduled for a PET scan on 8/8. Pt also complains of pain in his knee due to swelling from fluid overload. Pt states he has tried 4 breathing treatments today with no relief.

## 2015-07-26 NOTE — ED Notes (Signed)
UNABLE TO XOLLECT LABS AT THIS TIME PATIENT IS IN XRAY

## 2015-07-26 NOTE — Discharge Instructions (Signed)
Continue taking steroid for further management of your asthma.  Eat banana to help replenish your potassium level.  Call radiology office at [phone] for rescheduling if your PET scan study as needed.    Bronchospasm A bronchospasm is a spasm or tightening of the airways going into the lungs. During a bronchospasm breathing becomes more difficult because the airways get smaller. When this happens there can be coughing, a whistling sound when breathing (wheezing), and difficulty breathing. Bronchospasm is often associated with asthma, but not all patients who experience a bronchospasm have asthma. CAUSES  A bronchospasm is caused by inflammation or irritation of the airways. The inflammation or irritation may be triggered by:   Allergies (such as to animals, pollen, food, or mold). Allergens that cause bronchospasm may cause wheezing immediately after exposure or many hours later.   Infection. Viral infections are believed to be the most common cause of bronchospasm.   Exercise.   Irritants (such as pollution, cigarette smoke, strong odors, aerosol sprays, and paint fumes).   Weather changes. Winds increase molds and pollens in the air. Rain refreshes the air by washing irritants out. Cold air may cause inflammation.   Stress and emotional upset.  SIGNS AND SYMPTOMS   Wheezing.   Excessive nighttime coughing.   Frequent or severe coughing with a simple cold.   Chest tightness.   Shortness of breath.  DIAGNOSIS  Bronchospasm is usually diagnosed through a history and physical exam. Tests, such as chest X-rays, are sometimes done to look for other conditions. TREATMENT   Inhaled medicines can be given to open up your airways and help you breathe. The medicines can be given using either an inhaler or a nebulizer machine.  Corticosteroid medicines may be given for severe bronchospasm, usually when it is associated with asthma. HOME CARE INSTRUCTIONS   Always have a  plan prepared for seeking medical care. Know when to call your health care provider and local emergency services (911 in the [place] Know where you can access local emergency care.  Only take medicines as directed by your health care provider.  If you were prescribed an inhaler or nebulizer machine, ask your health care provider to explain how to use it correctly. Always use a spacer with your inhaler if you were given one.  It is necessary to remain calm during an attack. Try to relax and breathe more slowly.  Control your home environment in the following ways:   Change your heating and air conditioning filter at least once a month.   Limit your use of fireplaces and wood stoves.  Do not smoke and do not allow smoking in your home.   Avoid exposure to perfumes and fragrances.   Get rid of pests (such as roaches and mice) and their droppings.   Throw away plants if you see mold on them.   Keep your house clean and dust free.   Replace carpet with wood, tile, or vinyl flooring. Carpet can trap dander and dust.   Use allergy-proof pillows, mattress covers, and box spring covers.   Wash bed sheets and blankets every week in hot water and dry them in a dryer.   Use blankets that are made of polyester or cotton.   Wash hands frequently. SEEK MEDICAL CARE IF:   You have muscle aches.   You have chest pain.   The sputum changes from clear or white to yellow, green, gray, or bloody.   The sputum you cough up gets thicker.   There  are problems that may be related to the medicine you are given, such as a rash, itching, swelling, or trouble breathing.  SEEK IMMEDIATE MEDICAL CARE IF:   You have worsening wheezing and coughing even after taking your prescribed medicines.   You have increased difficulty breathing.   You develop severe chest pain. MAKE SURE YOU:   Understand these instructions.  Will watch your condition.  Will get help right away if you  are not doing well or get worse. Document Released: 12/15/2003 Document Revised: 12/17/2013 Document Reviewed: 06/03/2013 [facility] Patient Information 2015 [place], [place]. This information is not intended to replace advice given to you by your health care provider. Make sure you discuss any questions you have with your health care provider.

## 2015-07-27 ENCOUNTER — Telehealth: Payer: Self-pay | Admitting: Family

## 2015-07-27 MED ORDER — POTASSIUM CHLORIDE CRYS ER 20 MEQ PO TBCR: 20.0000 meq | EXTENDED_RELEASE_TABLET | Freq: Every day | ORAL | Status: DC

## 2015-07-27 NOTE — Telephone Encounter (Signed)
Sharyn Lull did 3 way call between [place] and the pharmacy. I advised pharmacy that RX was resent and confirmed as received 10:59am today. They are waiting on ok to fill from nurse/MD stating they received earlier call to d/c this rx.

## 2015-07-27 NOTE — Telephone Encounter (Signed)
Can we please try to get PET scan moved up sooner?  OK to change location.

## 2015-07-27 NOTE — Telephone Encounter (Signed)
Notified pt's spouse that Rx has been cancelled at [facility] and sent to [facility].

## 2015-07-27 NOTE — Telephone Encounter (Signed)
Spoke with pt this morning and he states that PET scan is scheduled for tomorrow at 10:30am.

## 2015-07-27 NOTE — Telephone Encounter (Signed)
Relation to pt: Lachney,Michelle (spouse) Call back [place], [place] - [address] [phone] (Phone) [phone] (Fax)         Reason for call:  Spouse called and was not happy potassium chloride SA (K-DUR,KLOR-CON) 20 MEQ tablet  Was sent to wrong pharmacy and insisted it be sent right away, advised patient NP is currently in clinic and it will be taken care of.

## 2015-07-27 NOTE — Telephone Encounter (Signed)
Spoke with Florentina Jenny at [facility] and gave her permission to fill Potassium Rx we sent today.

## 2015-07-27 NOTE — Telephone Encounter (Signed)
Great.  Thanks

## 2015-07-28 ENCOUNTER — Telehealth: Payer: Self-pay | Admitting: Family

## 2015-07-28 ENCOUNTER — Encounter (HOSPITAL_COMMUNITY)
Admission: RE | Admit: 2015-07-28 | Discharge: 2015-07-28 | Disposition: A | Discharge status: Home / Self Care | Payer: Medicare Other | Source: Ambulatory Visit | Attending: Family | Admitting: Family

## 2015-07-28 DIAGNOSIS — I2510 Atherosclerotic heart disease of native coronary artery without angina pectoris: Secondary | ICD-10-CM | POA: Insufficient documentation

## 2015-07-28 DIAGNOSIS — K5730 Diverticulosis of large intestine without perforation or abscess without bleeding: Secondary | ICD-10-CM | POA: Insufficient documentation

## 2015-07-28 DIAGNOSIS — N289 Disorder of kidney and ureter, unspecified: Secondary | ICD-10-CM | POA: Diagnosis not present

## 2015-07-28 DIAGNOSIS — R918 Other nonspecific abnormal finding of lung field: Secondary | ICD-10-CM | POA: Insufficient documentation

## 2015-07-28 DIAGNOSIS — K4090 Unilateral inguinal hernia, without obstruction or gangrene, not specified as recurrent: Secondary | ICD-10-CM | POA: Insufficient documentation

## 2015-07-28 DIAGNOSIS — R590 Localized enlarged lymph nodes: Secondary | ICD-10-CM | POA: Insufficient documentation

## 2015-07-28 DIAGNOSIS — K7689 Other specified diseases of liver: Secondary | ICD-10-CM | POA: Insufficient documentation

## 2015-07-28 LAB — GLUCOSE, CAPILLARY: GLUCOSE-CAPILLARY: 124 mg/dL — AB (ref 65–99)

## 2015-07-28 MED ORDER — FLUDEOXYGLUCOSE F - 18 (FDG) INJECTION
11.7500 | Freq: Once | INTRAVENOUS | Status: AC | PRN
  Administered 2015-07-28: 11.75 via INTRAVENOUS

## 2015-07-28 NOTE — Telephone Encounter (Signed)
Wt Readings from Last 3 Encounters:  07/22/15 235 lb 6.4 oz (106.777 kg)  07/20/15 240 lb (108.863 kg)  07/20/15 240 lb (108.863 kg)  weight down to 230  Reviewed PET results with pt and plan to repeat CT scan in 3 months. Pt is agreeable. He will keep apt tomorrow.

## 2015-07-28 NOTE — Telephone Encounter (Signed)
Pt declined

## 2015-07-29 ENCOUNTER — Ambulatory Visit (INDEPENDENT_AMBULATORY_CARE_PROVIDER_SITE_OTHER): Payer: Medicare Other | Admitting: Family

## 2015-07-29 ENCOUNTER — Encounter: Payer: Self-pay | Admitting: Family

## 2015-07-29 VITALS — BP 128/82 | HR 89 | Temp 98.2°F | Resp 18 | Ht 65.0 in | Wt 230.6 lb

## 2015-07-29 DIAGNOSIS — J4521 Mild intermittent asthma with (acute) exacerbation: Secondary | ICD-10-CM | POA: Diagnosis not present

## 2015-07-29 DIAGNOSIS — E876 Hypokalemia: Secondary | ICD-10-CM | POA: Diagnosis not present

## 2015-07-29 DIAGNOSIS — E8770 Fluid overload, unspecified: Secondary | ICD-10-CM

## 2015-07-29 DIAGNOSIS — R911 Solitary pulmonary nodule: Secondary | ICD-10-CM | POA: Diagnosis not present

## 2015-07-29 LAB — BASIC METABOLIC PANEL
BUN: 18 mg/dL (ref 6–23)
CALCIUM: 9.7 mg/dL (ref 8.4–10.5)
CHLORIDE: 98 meq/L (ref 96–112)
CO2: 28 meq/L (ref 19–32)
CREATININE: 0.97 mg/dL (ref 0.40–1.50)
GFR: 85.86 mL/min (ref >60.00)
GLUCOSE: 93 mg/dL (ref 70–99)
POTASSIUM: 3.6 meq/L (ref 3.5–5.1)
Sodium: 134 mEq/L — ABNORMAL LOW (ref 135–145)

## 2015-07-29 MED ORDER — ALBUTEROL SULFATE HFA 108 (90 BASE) MCG/ACT IN AERS: 2.0000 | INHALATION_SPRAY | RESPIRATORY_TRACT | Status: DC | PRN

## 2015-07-29 NOTE — Progress Notes (Signed)
Pre visit review using our clinic review tool, if applicable. No additional management support is needed unless otherwise documented below in the visit note. 

## 2015-07-29 NOTE — Progress Notes (Signed)
Subjective:    Patient ID: Kyle Blair, male    DOB: 03/09/1961, 54 y.o.   MRN: [id number]  HPI   Kyle Blair is a 54 yr old male who presents today for follow up.  1) Pulmonary nodules-  The patient underwent PET scan to evaluate numerous pulmonary nodules noted on CT chest.    Results as follows:  IMPRESSION: 1. Decreasing non-hypermetabolic mediastinal and right hilar lymphadenopathy, suggesting a resolving infectious/inflammatory process. 2. Minimally hypermetabolic 1.8 cm right upper lobe pulmonary nodule, which appears mildly decreased in size, which also suggests a resolving infectious/inflammatory process. 3. Otherwise no hypermetabolic disease in the neck, chest, abdomen or pelvis. Additional stable subcentimeter pulmonary nodules, which are below PET resolution. RECOMMENDATION:  Recommend a follow-up enhanced chest CT in 3 months.  2) Volume overload-   Wt Readings from Last 3 Encounters:  07/29/15 230 lb 9.6 oz (104.599 kg)  07/22/15 235 lb 6.4 oz (106.777 kg)  07/20/15 240 lb (108.863 kg)      Review of Systems See HPI  Past Medical History  Diagnosis Date  . Asthma   . Polycystic kidney disease   . Hypothyroid   . GERD with stricture   . BPH (benign prostatic hypertrophy)   . Hyperlipidemia   . Lung nodule 02/03/2012  . History of stroke 2004  . Stroke   . Arthritis   . Hypertension     History   Social History  . Marital Status: Married    Spouse Name: N/A  . Number of Children: 2  . Years of Education: N/A   Occupational History  . DISABLED    Social History Main Topics  . Smoking status: Former Smoker -- 1.00 packs/day for 20 years    Types: Cigarettes    Quit date: 03/15/2015  . Smokeless tobacco: Never Used  . Alcohol Use: 0.0 oz/week    0 Standard drinks or equivalent per week     Comment: rarely  . Drug Use: No  . Sexual Activity: Not on file   Other Topics Concern  . Not on file   Social History Narrative   Caffeine use:  1 pot coffee daily, 1 glass tea   Regular exercise:  No. Has active lifestyle.   2 biological and 1 stepson.   Former smoker- quit in 2007   He is on disability- reports that he has a history of heat stroke.               Past Surgical History  Procedure Laterality Date  . Hernia repair  2005    with mesh  . Nasal polyp excision  1992 or 93  . Carpal tunnel release    . Knee arthroscopy Bilateral 12/05/13    cyst and bone spur removed from left knee per pt.  . Colonoscopy    . Polypectomy    . Knee arthroscopy Left 01/2015  . 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 Heart disease Mother     pacemaker  . Diabetes Father   . Kidney disease Father   . Diabetes Sister   . Kidney disease Sister   . Colon cancer Neg Hx   . Esophageal cancer Neg Hx   . Rectal cancer Neg Hx   . Stomach cancer Neg Hx   . Prostate  cancer Neg Hx   . Pancreatic cancer Neg Hx     Allergies  Allergen Reactions  . Aspirin Anaphylaxis  . Ibuprofen Anaphylaxis  . Peanut Butter Flavor Anaphylaxis  . Cephalexin Rash    Current Outpatient Prescriptions on File Prior to Visit  Medication Sig Dispense Refill  . acetaminophen (TYLENOL) 325 MG tablet Take 1-2 tablets (325-650 mg total) by mouth every 6 (six) hours as needed.    Marland Kitchen ADVAIR DISKUS 250-50 MCG/DOSE AEPB INHALE 1 PUFF BY MOUTH TWICE DAILY 60 each 5  . albuterol (PROVENTIL) (2.5 MG/3ML) 0.083% nebulizer solution Take 3 mLs (2.5 mg total) by nebulization every 6 (six) hours as needed for wheezing or shortness of breath. 75 mL 3  . ALPRAZolam (XANAX) 0.25 MG tablet Take 1 tablet (0.25 mg total) by mouth 2 (two) times daily as needed for anxiety. 20 tablet 0  . amLODipine (NORVASC) 5 MG tablet TAKE 1 TABLET(5 MG) BY MOUTH DAILY 30 tablet 2  .  benazepril (LOTENSIN) 10 MG tablet TAKE 1 TABLET(10 MG) BY MOUTH DAILY 30 tablet 2  . diphenhydrAMINE (BENADRYL) 25 MG tablet Take 25 mg by mouth at bedtime as needed for sleep.     Marland Kitchen docusate sodium (COLACE) 100 MG capsule Take 1 capsule (100 mg total) by mouth 2 (two) times daily. 10 capsule 0  . ferrous sulfate 325 (65 FE) MG tablet Take 1 tablet (325 mg total) by mouth 3 (three) times daily after meals.  3  . Fluticasone-Salmeterol (ADVAIR) 250-50 MCG/DOSE AEPB Inhale 1 puff into the lungs 2 (two) times daily.    . furosemide (LASIX) 20 MG tablet Take 1 tablet (20 mg total) by mouth daily. 30 tablet 3  . HYDROmorphone (DILAUDID) 2 MG tablet Take 1 tablet (2 mg total) by mouth at bedtime as needed for severe pain. 6 tablet 0  . levothyroxine (SYNTHROID, LEVOTHROID) 175 MCG tablet Take 1 tablet (175 mcg total) by mouth daily before breakfast. 30 tablet 2  . lovastatin (MEVACOR) 40 MG tablet TAKE 1 TABLET(40 MG) BY MOUTH AT BEDTIME 90 tablet 1  . methocarbamol (ROBAXIN) 500 MG tablet Take 1 tablet (500 mg total) by mouth every 6 (six) hours as needed for muscle spasms. 50 tablet 0  . montelukast (SINGULAIR) 10 MG tablet TAKE 1 TABLET BY MOUTH EVERY NIGHT AT BEDTIME (Patient taking differently: TAKE 1 TABLET BY MOUTH EVERY MORNING.) 90 tablet 1  . nystatin (MYCOSTATIN) 100000 UNIT/ML suspension Take 5 mLs (500,000 Units total) by mouth 4 (four) times daily. 240 mL 0  . omeprazole (PRILOSEC) 40 MG capsule TAKE 1 CAPSULE BY MOUTH EVERY DAY 90 capsule 1  . oxyCODONE (ROXICODONE) 15 MG immediate release tablet Take 15 mg by mouth every 4 (four) hours as needed for pain.    . polyethylene glycol (MIRALAX / GLYCOLAX) packet Take 17 g by mouth 2 (two) times daily. 14 each 0  . potassium chloride SA (K-DUR,KLOR-CON) 20 MEQ tablet Take 1 tablet (20 mEq total) by mouth daily. 30 tablet 0  . predniSONE (DELTASONE) 20 MG tablet 3 tabs po day one, then 2 tabs daily x 4 days 11 tablet 0  . rivaroxaban (XARELTO)  10 MG TABS tablet Take 1 tablet (10 mg total) by mouth daily. 14 tablet 0  . tamsulosin (FLOMAX) 0.4 MG CAPS capsule TAKE 1 CAPSULE(0.4 MG) BY MOUTH AT BEDTIME 90 capsule 1   No current facility-administered medications on file prior to visit.    BP 128/82 mmHg  Pulse 89  Temp(Src) 98.2 F (36.8 C) (Oral)  Resp 18  Ht 5\' 5"  (1.651 m)  Wt 230 lb 9.6 oz (104.599 kg)  BMI 38.37 kg/m2  SpO2 96%       Objective:   Physical Exam  Constitutional: He is oriented to person, place, and time. He appears well-developed and well-nourished. No distress.  HENT:  Head: Normocephalic and atraumatic.  Cardiovascular: Normal rate and regular rhythm.   No murmur heard. Pulmonary/Chest: Effort normal and breath sounds normal. No respiratory distress. He has no wheezes. He has no rales.  Musculoskeletal:  1-2+ RLE edema, 1+ LLE edema  Neurological: He is alert and oriented to person, place, and time.  Skin: Skin is warm and dry.  Psychiatric: He has a normal mood and affect. His behavior is normal. Thought content normal.          Assessment & Plan:

## 2015-07-29 NOTE — Patient Instructions (Addendum)
Continue lasix 20mg  once daily. Complete lab work prior to leaving. Arrange follow up with your kidney doctor. You will be contacted about your referral to pulmonology.

## 2015-07-30 ENCOUNTER — Encounter: Payer: Self-pay | Admitting: Family

## 2015-07-30 DIAGNOSIS — E8770 Fluid overload, unspecified: Secondary | ICD-10-CM | POA: Insufficient documentation

## 2015-07-30 NOTE — Assessment & Plan Note (Signed)
Improving, continue daily lasix.  Obtain bmet. Advised daily weights.  I suspect he is a few pounds away from his dry weight still.

## 2015-07-30 NOTE — Assessment & Plan Note (Signed)
Plan referral to pulmonology and repeat CT in 3 months.

## 2015-07-30 NOTE — Assessment & Plan Note (Signed)
Improving, continue advair/singulair.  v

## 2015-07-31 ENCOUNTER — Ambulatory Visit (HOSPITAL_COMMUNITY): Payer: Medicare Other

## 2015-08-03 ENCOUNTER — Ambulatory Visit (HOSPITAL_COMMUNITY): Payer: Medicare Other

## 2015-08-04 ENCOUNTER — Other Ambulatory Visit: Payer: Self-pay | Admitting: Family

## 2015-08-11 ENCOUNTER — Telehealth: Payer: Self-pay | Admitting: *Deleted

## 2015-08-11 MED ORDER — NYSTATIN 100000 UNIT/ML MT SUSP: 5.0000 mL | Freq: Four times a day (QID) | OROMUCOSAL | Status: DC

## 2015-08-11 NOTE — Telephone Encounter (Signed)
Left detailed message on voicemail re: below instructions and per verbal from PCP see if SOB improves with increase of furosemide. If pt is having chest tightness or wheezing then she will need to re-evaluate him in the office before considering refill of prednisone. Scheduled pt f/u for Friday at 1pm with Melissa.

## 2015-08-11 NOTE — Telephone Encounter (Signed)
Increase lasix to 20mg  bid and please bring him back on Friday at [place]. He should call us sooner if further weight gain or worsening SOB. Refill sent on nystatin.

## 2015-08-11 NOTE — Telephone Encounter (Addendum)
Pt called back. States he cannot wait until Friday. Does not feel tight in chest or wheezing. States he is supposed to have a "diversion on his right knee to remove scar tissue" and re-do physical therapy. Scheduled pt appt with Melissa for tomorrow at 1pm.

## 2015-08-11 NOTE — Telephone Encounter (Signed)
Received call from pt. He states that nystatin originally cleared his thrush but it has now returned. He completed prednisone and has been taking furosemide 20mg  daily. States he initially had about 11 pound weight loss with the furosemide but home scales weigh him at 240 today. 10 pounds greater than our last wt at visit on 07/30/15. Continues to feel short of breath. Pt is requesting refill of nystatin and prednisone.  Please advise?

## 2015-08-11 NOTE — Telephone Encounter (Signed)
Pt would like refills sent to [facility].  Please advise below requests?

## 2015-08-12 ENCOUNTER — Ambulatory Visit (HOSPITAL_BASED_OUTPATIENT_CLINIC_OR_DEPARTMENT_OTHER)
Admission: RE | Admit: 2015-08-12 | Discharge: 2015-08-12 | Disposition: A | Discharge status: Home / Self Care | Payer: Medicare Other | Source: Ambulatory Visit | Attending: Family | Admitting: Family

## 2015-08-12 ENCOUNTER — Ambulatory Visit (INDEPENDENT_AMBULATORY_CARE_PROVIDER_SITE_OTHER): Payer: Medicare Other | Admitting: Family

## 2015-08-12 ENCOUNTER — Encounter: Payer: Self-pay | Admitting: Family

## 2015-08-12 ENCOUNTER — Telehealth: Payer: Self-pay | Admitting: Family

## 2015-08-12 VITALS — BP 98/78 | HR 68 | Temp 97.5°F | Resp 16 | Ht 65.0 in | Wt 235.4 lb

## 2015-08-12 DIAGNOSIS — J9811 Atelectasis: Secondary | ICD-10-CM | POA: Diagnosis not present

## 2015-08-12 DIAGNOSIS — E8770 Fluid overload, unspecified: Secondary | ICD-10-CM

## 2015-08-12 DIAGNOSIS — Z01818 Encounter for other preprocedural examination: Secondary | ICD-10-CM | POA: Insufficient documentation

## 2015-08-12 DIAGNOSIS — Z96651 Presence of right artificial knee joint: Secondary | ICD-10-CM

## 2015-08-12 DIAGNOSIS — R110 Nausea: Secondary | ICD-10-CM | POA: Diagnosis not present

## 2015-08-12 MED ORDER — FUROSEMIDE 20 MG PO TABS: 20.0000 mg | ORAL_TABLET | Freq: Two times a day (BID) | ORAL | Status: DC

## 2015-08-12 NOTE — Progress Notes (Signed)
Pre visit review using our clinic review tool, if applicable. No additional management support is needed unless otherwise documented below in the visit note. 

## 2015-08-12 NOTE — Patient Instructions (Addendum)
Please complete lab work prior to leaving.  Complete chest x ray on the first floor. Continue lasix 20mg  twice daily. Continue daily weight checks. Call me if weight goes above 238. Try to eat 3 meals a day.  Call if nausea worsens or does not improve.  Call if you develop recurrent low blood sugars.

## 2015-08-12 NOTE — Progress Notes (Signed)
Subjective:    Patient ID: Kyle Blair, male    DOB: 1961/01/22, 54 y.o.   MRN: [id number]  HPI  Kyle Blair is a 54 yr old male who presents today to discuss edema.  Reports that he was taking lasix once daily until yesterday.  Weight yesterday at home was 240.  Took 3 lasix yesterday.  Reports that at bedtime weight was 234.  He reported yesterday that he was short of breath. Today he reports improvement in his breathing.   Wt Readings from Last 3 Encounters:  08/12/15 235 lb 6.4 oz (106.777 kg)  07/29/15 230 lb 9.6 oz (104.599 kg)  07/22/15 235 lb 6.4 oz (106.777 kg)   Patient had sweating this AM.  Sugar was 66.  Came up after mountain dew with sugar in it.  Not eating much because of nausea with eating.  Just started oxycodone this month.  Denies abdominal pain or vomiting.    Review of Systems See HPI  Past Medical History  Diagnosis Date  . Asthma   . Polycystic kidney disease   . Hypothyroid   . GERD with stricture   . BPH (benign prostatic hypertrophy)   . Hyperlipidemia   . Lung nodule 02/03/2012  . History of stroke 2004  . Stroke   . Arthritis   . Hypertension     Social History   Social History  . Marital Status: Married    Spouse Name: N/A  . Number of Children: 2  . Years of Education: N/A   Occupational History  . DISABLED    Social History Main Topics  . Smoking status: Former Smoker -- 1.00 packs/day for 20 years    Types: Cigarettes    Quit date: 03/15/2015  . Smokeless tobacco: Never Used  . Alcohol Use: 0.0 oz/week    0 Standard drinks or equivalent per week     Comment: rarely  . Drug Use: No  . Sexual Activity: Not on file   Other Topics Concern  . Not on file   Social History Narrative   Caffeine use:  1 pot coffee daily, 1 glass tea   Regular exercise:  No. Has active lifestyle.   2 biological and 1 stepson.   Former smoker- quit in 2007   He is on disability- reports that he has a history of heat stroke.                Past Surgical History  Procedure Laterality Date  . Hernia repair  2005    with mesh  . Nasal polyp excision  1992 or 93  . Carpal tunnel release    . Knee arthroscopy Bilateral 12/05/13    cyst and bone spur removed from left knee per pt.  . Colonoscopy    . Polypectomy    . Knee arthroscopy Left 01/2015  . 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 Heart disease Mother     pacemaker  . Diabetes Father   . Kidney disease Father   . Diabetes Sister   . Kidney disease Sister   . Colon cancer Neg Hx   . Esophageal cancer Neg Hx   . Rectal cancer Neg Hx   . Stomach cancer Neg Hx   . Prostate cancer Neg Hx   . Pancreatic cancer Neg  Hx     Allergies  Allergen Reactions  . Aspirin Anaphylaxis  . Ibuprofen Anaphylaxis  . Peanut Butter Flavor Anaphylaxis  . Cephalexin Rash    Current Outpatient Prescriptions on File Prior to Visit  Medication Sig Dispense Refill  . acetaminophen (TYLENOL) 325 MG tablet Take 1-2 tablets (325-650 mg total) by mouth every 6 (six) hours as needed.    Marland Kitchen ADVAIR DISKUS 250-50 MCG/DOSE AEPB INHALE 1 PUFF BY MOUTH TWICE DAILY 60 each 5  . albuterol (PROVENTIL HFA;VENTOLIN HFA) 108 (90 BASE) MCG/ACT inhaler Inhale 2 puffs into the lungs every 4 (four) hours as needed for wheezing or shortness of breath. 1 Inhaler 5  . albuterol (PROVENTIL) (2.5 MG/3ML) 0.083% nebulizer solution Take 3 mLs (2.5 mg total) by nebulization every 6 (six) hours as needed for wheezing or shortness of breath. 75 mL 3  . ALPRAZolam (XANAX) 0.25 MG tablet Take 1 tablet (0.25 mg total) by mouth 2 (two) times daily as needed for anxiety. 20 tablet 0  . amLODipine (NORVASC) 5 MG tablet TAKE 1 TABLET(5 MG) BY MOUTH DAILY 30 tablet 2  . benazepril (LOTENSIN) 10 MG tablet TAKE 1 TABLET(10 MG) BY MOUTH  DAILY 30 tablet 2  . diphenhydrAMINE (BENADRYL) 25 MG tablet Take 25 mg by mouth at bedtime as needed for sleep.     Marland Kitchen docusate sodium (COLACE) 100 MG capsule Take 1 capsule (100 mg total) by mouth 2 (two) times daily. 10 capsule 0  . ferrous sulfate 325 (65 FE) MG tablet Take 1 tablet (325 mg total) by mouth 3 (three) times daily after meals.  3  . Fluticasone-Salmeterol (ADVAIR) 250-50 MCG/DOSE AEPB Inhale 1 puff into the lungs 2 (two) times daily.    . furosemide (LASIX) 20 MG tablet TAKE 1 TABLET BY MOUTH DAILY 30 tablet 3  . levothyroxine (SYNTHROID, LEVOTHROID) 175 MCG tablet Take 1 tablet (175 mcg total) by mouth daily before breakfast. 30 tablet 2  . lovastatin (MEVACOR) 40 MG tablet TAKE 1 TABLET(40 MG) BY MOUTH AT BEDTIME 90 tablet 1  . methocarbamol (ROBAXIN) 500 MG tablet Take 1 tablet (500 mg total) by mouth every 6 (six) hours as needed for muscle spasms. 50 tablet 0  . montelukast (SINGULAIR) 10 MG tablet TAKE 1 TABLET BY MOUTH EVERY NIGHT AT BEDTIME (Patient taking differently: TAKE 1 TABLET BY MOUTH EVERY MORNING.) 90 tablet 1  . nystatin (MYCOSTATIN) 100000 UNIT/ML suspension Take 5 mLs (500,000 Units total) by mouth 4 (four) times daily. 240 mL 0  . omeprazole (PRILOSEC) 40 MG capsule TAKE 1 CAPSULE BY MOUTH EVERY DAY 90 capsule 1  . oxyCODONE (ROXICODONE) 15 MG immediate release tablet Take 15 mg by mouth every 4 (four) hours as needed for pain.    . polyethylene glycol (MIRALAX / GLYCOLAX) packet Take 17 g by mouth 2 (two) times daily. 14 each 0  . potassium chloride SA (K-DUR,KLOR-CON) 20 MEQ tablet Take 1 tablet (20 mEq total) by mouth daily. 30 tablet 0  . rivaroxaban (XARELTO) 10 MG TABS tablet Take 1 tablet (10 mg total) by mouth daily. 14 tablet 0  . tamsulosin (FLOMAX) 0.4 MG CAPS capsule TAKE 1 CAPSULE(0.4 MG) BY MOUTH AT BEDTIME 90 capsule 1   No current facility-administered medications on file prior to visit.    BP 98/78 mmHg  Pulse 68  Temp(Src) 97.5 F (36.4  C) (Oral)  Resp 16  Ht 5\' 5"  (1.651 m)  Wt 235 lb 6.4 oz (106.777 kg)  BMI 39.17 kg/m2  SpO2 96%       Objective:   Physical Exam  Constitutional: He is oriented to person, place, and time. He appears well-developed and well-nourished. No distress.  HENT:  Head: Normocephalic and atraumatic.  Cardiovascular: Normal rate and regular rhythm.   No murmur heard. Pulmonary/Chest: Effort normal and breath sounds normal. No respiratory distress. He has no wheezes. He has no rales.  Musculoskeletal:  2-3+ RLE edema.  1+ LLE edema  Neurological: He is alert and oriented to person, place, and time.  Skin: Skin is warm and dry.  Psychiatric: He has a normal mood and affect. His behavior is normal. Thought content normal.          Assessment & Plan:

## 2015-08-12 NOTE — Telephone Encounter (Signed)
Left message for pt re: cxr results.

## 2015-08-13 DIAGNOSIS — R110 Nausea: Secondary | ICD-10-CM | POA: Insufficient documentation

## 2015-08-13 NOTE — Assessment & Plan Note (Signed)
Obtain LFT.  Could be secondary to narcotic use.  I suspect that his episode of hypoglycemia is due to his erratic eating schedule. Reports that he only eats one meal a day. Advised pt to try to get 3 meals a day in. If nausea does not improve consider abdominal US and GI referral.

## 2015-08-13 NOTE — Assessment & Plan Note (Addendum)
Clinically improved today.  CXR is performed and notes atelectasis, no infiltrate or frank edema.  No wheezing today. Advised pt to continue lasix 20mg  bid. 2D echo performed in April noted normal LV function and grade 1 diastolic dysfunction. BP is a little soft today, likely due to lasix.  Monitor BP.   Obtain Cmet to asses potassium and LFT's.

## 2015-08-13 NOTE — Assessment & Plan Note (Deleted)
Patient is scheduled for knee procedure tomorrow with Dr. Alvan Dame.

## 2015-08-14 ENCOUNTER — Ambulatory Visit: Payer: Medicare Other | Admitting: Family

## 2015-08-14 NOTE — Telephone Encounter (Signed)
Wt Readings from Last 3 Encounters:  08/12/15 235 lb 6.4 oz (106.777 kg)  07/29/15 230 lb 9.6 oz (104.599 kg)  07/22/15 235 lb 6.4 oz (106.777 kg)

## 2015-08-14 NOTE — Telephone Encounter (Signed)
Talked to [facility], he said he'll come in sometime on Monday for labs but he isn't sure what time he'll actually be here. He said he doesn't know what day he can f/u with Einar Pheasant, he'll let us know wen he comes for labs. He also stated he wants to switch PT to the office in our building and is going to look into how to do that.

## 2015-08-14 NOTE — Telephone Encounter (Signed)
Spoke to pt. Reports knee procedure went well. Began to start wheezing again yesterday.  Noted no improvement with albuterol. Weight 240 this AM.  Took 2 tabs of lasix  This AM. Advised pt to continue lasix 2 tabs PO in AM 1 tab PO in PM. Advised pt to come to the lab this afternoon for a lab draw.  Need to check K+. Will refer to cardiology due to ongoing volume management issues.  He has call into his nephrologist and would like to give them till the end of the day to call him back.  I would like him seen back early next week in the office for evaluation.  I am on vacation and he would like to see Elyn Aquas PA-C in my absence.   Steffanie Dunn- could you please contact pt and arrange lab appointment for this afternoon and office visit with [facility] for Monday or Tuesday of next week?

## 2015-08-14 NOTE — Telephone Encounter (Signed)
Pt called. States he had his knee procedure. Gaining fluid again. Yesterday morning he was weighing 237, had only eliminated 22mL of fluid by 2pm yesterday and felt like he was continuing gain fluid. Weight was 240 by midnight. Took 2 lasix and eliminated 1668mL of fluid. States he has left messages with current nephrologist but isn't getting return calls. Pt currently established with [facility] and is wanting referral to another nephrology group. Also wants to know what to do about his fluid retention. I did not discuss CXR result with pt. He is requesting that NP call him personally @ [phone].  Please advise.

## 2015-08-17 ENCOUNTER — Other Ambulatory Visit (INDEPENDENT_AMBULATORY_CARE_PROVIDER_SITE_OTHER): Payer: Medicare Other

## 2015-08-17 ENCOUNTER — Ambulatory Visit (INDEPENDENT_AMBULATORY_CARE_PROVIDER_SITE_OTHER): Payer: Medicare Other | Admitting: Physician Assistant

## 2015-08-17 ENCOUNTER — Other Ambulatory Visit: Payer: Medicare Other

## 2015-08-17 ENCOUNTER — Encounter: Payer: Self-pay | Admitting: Physician Assistant

## 2015-08-17 VITALS — BP 122/94 | HR 71 | Temp 98.1°F | Resp 18 | Ht 65.0 in | Wt 235.4 lb

## 2015-08-17 DIAGNOSIS — E8770 Fluid overload, unspecified: Secondary | ICD-10-CM

## 2015-08-17 DIAGNOSIS — R110 Nausea: Secondary | ICD-10-CM | POA: Diagnosis not present

## 2015-08-17 DIAGNOSIS — E039 Hypothyroidism, unspecified: Secondary | ICD-10-CM | POA: Diagnosis not present

## 2015-08-17 DIAGNOSIS — I519 Heart disease, unspecified: Principal | ICD-10-CM

## 2015-08-17 LAB — COMPREHENSIVE METABOLIC PANEL
ALBUMIN: 4.9 g/dL (ref 3.5–5.2)
ALT: 42 U/L (ref 0–53)
AST: 67 U/L — ABNORMAL HIGH (ref 0–37)
Alkaline Phosphatase: 69 U/L (ref 39–117)
BUN: 15 mg/dL (ref 6–23)
CO2: 31 mEq/L (ref 19–32)
Calcium: 9.9 mg/dL (ref 8.4–10.5)
Chloride: 96 mEq/L (ref 96–112)
Creatinine, Ser: 1.09 mg/dL (ref 0.40–1.50)
GFR: 75.03 mL/min (ref >60.00)
Glucose, Bld: 103 mg/dL — ABNORMAL HIGH (ref 70–99)
POTASSIUM: 3.4 meq/L — AB (ref 3.5–5.1)
Sodium: 137 mEq/L (ref 135–145)
TOTAL PROTEIN: 7.3 g/dL (ref 6.0–8.3)
Total Bilirubin: 0.6 mg/dL (ref 0.2–1.2)

## 2015-08-17 LAB — TSH: TSH: 562.491 u[IU]/mL — AB (ref 0.350–4.500)

## 2015-08-17 NOTE — Progress Notes (Signed)
Pre visit review using our clinic review tool, if applicable. No additional management support is needed unless otherwise documented below in the visit note/SLS  

## 2015-08-17 NOTE — Progress Notes (Signed)
Patient presents to clinic today for follow-up of hypervolemia, taking Lasix 40 mg each AM. Was instructed to take 20 mg PM each evening as well but has only been taking PRN. HAs continued potassium supplement as directed by PCP. Endorses weight stable overall, only increasing ~ 1 pound over past few days. Still endorses some orthopnea. Echo in April without sign of HF. Has Cardiology referral in place. Has upcoming appointment with [facility] on 9/10 for PKD. Denies new symptoms. Had labs drawn at lunch today to reassess potassium levels. Have not resulted.  Past Medical History  Diagnosis Date  . Asthma   . Polycystic kidney disease   . Hypothyroid   . GERD with stricture   . BPH (benign prostatic hypertrophy)   . Hyperlipidemia   . Lung nodule 02/03/2012  . History of stroke 2004  . Stroke   . Arthritis   . Hypertension     Current Outpatient Prescriptions on File Prior to Visit  Medication Sig Dispense Refill  . acetaminophen (TYLENOL) 325 MG tablet Take 1-2 tablets (325-650 mg total) by mouth every 6 (six) hours as needed.    Marland Kitchen ADVAIR DISKUS 250-50 MCG/DOSE AEPB INHALE 1 PUFF BY MOUTH TWICE DAILY 60 each 5  . albuterol (PROVENTIL HFA;VENTOLIN HFA) 108 (90 BASE) MCG/ACT inhaler Inhale 2 puffs into the lungs every 4 (four) hours as needed for wheezing or shortness of breath. 1 Inhaler 5  . albuterol (PROVENTIL) (2.5 MG/3ML) 0.083% nebulizer solution Take 3 mLs (2.5 mg total) by nebulization every 6 (six) hours as needed for wheezing or shortness of breath. 75 mL 3  . ALPRAZolam (XANAX) 0.25 MG tablet Take 1 tablet (0.25 mg total) by mouth 2 (two) times daily as needed for anxiety. 20 tablet 0  . amLODipine (NORVASC) 5 MG tablet TAKE 1 TABLET(5 MG) BY MOUTH DAILY 30 tablet 2  . benazepril (LOTENSIN) 10 MG tablet TAKE 1 TABLET(10 MG) BY MOUTH DAILY 30 tablet 2  . diphenhydrAMINE (BENADRYL) 25 MG tablet Take 25 mg by mouth at bedtime as needed for sleep.     Marland Kitchen docusate sodium  (COLACE) 100 MG capsule Take 1 capsule (100 mg total) by mouth 2 (two) times daily. 10 capsule 0  . Fluticasone-Salmeterol (ADVAIR) 250-50 MCG/DOSE AEPB Inhale 1 puff into the lungs 2 (two) times daily.    . furosemide (LASIX) 20 MG tablet Take 1 tablet (20 mg total) by mouth 2 (two) times daily. (Patient taking differently: Take 20 mg by mouth 3 (three) times daily. ) 60 tablet 3  . levothyroxine (SYNTHROID, LEVOTHROID) 175 MCG tablet Take 1 tablet (175 mcg total) by mouth daily before breakfast. 30 tablet 2  . lovastatin (MEVACOR) 40 MG tablet TAKE 1 TABLET(40 MG) BY MOUTH AT BEDTIME 90 tablet 1  . methocarbamol (ROBAXIN) 500 MG tablet Take 1 tablet (500 mg total) by mouth every 6 (six) hours as needed for muscle spasms. 50 tablet 0  . montelukast (SINGULAIR) 10 MG tablet TAKE 1 TABLET BY MOUTH EVERY NIGHT AT BEDTIME (Patient taking differently: TAKE 1 TABLET BY MOUTH EVERY MORNING.) 90 tablet 1  . nystatin (MYCOSTATIN) 100000 UNIT/ML suspension Take 5 mLs (500,000 Units total) by mouth 4 (four) times daily. 240 mL 0  . omeprazole (PRILOSEC) 40 MG capsule TAKE 1 CAPSULE BY MOUTH EVERY DAY 90 capsule 1  . oxyCODONE (ROXICODONE) 15 MG immediate release tablet Take 15 mg by mouth every 4 (four) hours as needed for pain.    . polyethylene glycol (MIRALAX /  GLYCOLAX) packet Take 17 g by mouth 2 (two) times daily. (Patient taking differently: Take 17 g by mouth 2 (two) times daily as needed. ) 14 each 0  . potassium chloride SA (K-DUR,KLOR-CON) 20 MEQ tablet Take 1 tablet (20 mEq total) by mouth daily. 30 tablet 0  . rivaroxaban (XARELTO) 10 MG TABS tablet Take 1 tablet (10 mg total) by mouth daily. 14 tablet 0  . tamsulosin (FLOMAX) 0.4 MG CAPS capsule TAKE 1 CAPSULE(0.4 MG) BY MOUTH AT BEDTIME 90 capsule 1   No current facility-administered medications on file prior to visit.    Allergies  Allergen Reactions  . Aspirin Anaphylaxis  . Doxycycline Hyclate Swelling    Facial Swelling  . Ibuprofen  Anaphylaxis  . Peanut Butter Flavor Anaphylaxis  . Cephalexin Rash    Family History  Problem Relation Age of Onset  . Heart disease Mother     pacemaker  . Diabetes Father   . Kidney disease Father   . Diabetes Sister   . Kidney disease Sister   . Colon cancer Neg Hx   . Esophageal cancer Neg Hx   . Rectal cancer Neg Hx   . Stomach cancer Neg Hx   . Prostate cancer Neg Hx   . Pancreatic cancer Neg Hx     Social History   Social History  . Marital Status: Married    Spouse Name: N/A  . Number of Children: 2  . Years of Education: N/A   Occupational History  . DISABLED    Social History Main Topics  . Smoking status: Former Smoker -- 1.00 packs/day for 20 years    Types: Cigarettes    Quit date: 03/15/2015  . Smokeless tobacco: Never Used  . Alcohol Use: 0.0 oz/week    0 Standard drinks or equivalent per week     Comment: rarely  . Drug Use: No  . Sexual Activity: Not Asked   Other Topics Concern  . None   Social History Narrative   Caffeine use:  1 pot coffee daily, 1 glass tea   Regular exercise:  No. Has active lifestyle.   2 biological and 1 stepson.   Former smoker- quit in 2007   He is on disability- reports that he has a history of heat stroke.              Review of Systems - See HPI.  All other ROS are negative.  BP 122/94 mmHg  Pulse 71  Temp(Src) 98.1 F (36.7 C) (Oral)  Resp 18  Ht _0  (1.651 m)  Wt 235 lb 6 oz (106.765 kg)  BMI 39.17 kg/m2  SpO2 97%  Physical Exam  Constitutional: He is oriented to person, place, and time and well-developed, well-nourished, and in no distress.  HENT:  Head: Normocephalic and atraumatic.  Eyes: Conjunctivae are normal.  Neck: Neck supple.  Cardiovascular: Normal rate, regular rhythm, normal heart sounds and intact distal pulses.   Negative for edema of lower extremities.  Pulmonary/Chest: Effort normal and breath sounds normal. No respiratory distress. He has no wheezes. He has no rales. He  exhibits no tenderness.  Neurological: He is alert and oriented to person, place, and time.  Skin: Skin is warm and dry. No rash noted.  Psychiatric: Affect normal.  Vitals reviewed.   Recent Results (from the past 2160 hour(s))  CBC     Status: Abnormal   Collection Time: 05/20/15  4:10 AM  Result Value Ref Range   WBC 11.8 (H)  4.0 - 10.5 K/uL   RBC 4.43 4.22 - 5.81 MIL/uL   Hemoglobin 13.2 13.0 - 17.0 g/dL   HCT 39.3 39.0 - 52.0 %   MCV 88.7 78.0 - 100.0 fL   MCH 29.8 26.0 - 34.0 pg   MCHC 33.6 30.0 - 36.0 g/dL   RDW 13.2 11.5 - 15.5 %   Platelets 158 150 - 400 K/uL  Basic metabolic panel     Status: Abnormal   Collection Time: 05/20/15  4:10 AM  Result Value Ref Range   Sodium 139 135 - 145 mmol/L   Potassium 4.5 3.5 - 5.1 mmol/L   Chloride 104 101 - 111 mmol/L   CO2 27 22 - 32 mmol/L   Glucose, Bld 164 (H) 65 - 99 mg/dL   BUN 14 6 - 20 mg/dL   Creatinine, Ser 0.78 0.61 - 1.24 mg/dL   Calcium 8.7 (L) 8.9 - 10.3 mg/dL   GFR calc non Af Amer >60 >60 mL/min   GFR calc Af Amer >60 >60 mL/min    Comment: (NOTE) The eGFR has been calculated using the CKD EPI equation. This calculation has not been validated in all clinical situations. eGFR's persistently <60 mL/min signify possible Chronic Kidney Disease.    Anion gap 8 5 - 15  CBC     Status: Abnormal   Collection Time: 05/21/15  4:20 AM  Result Value Ref Range   WBC 8.4 4.0 - 10.5 K/uL   RBC 4.06 (L) 4.22 - 5.81 MIL/uL   Hemoglobin 12.1 (L) 13.0 - 17.0 g/dL   HCT 36.5 (L) 39.0 - 52.0 %   MCV 89.9 78.0 - 100.0 fL   MCH 29.8 26.0 - 34.0 pg   MCHC 33.2 30.0 - 36.0 g/dL   RDW 13.4 11.5 - 15.5 %   Platelets 152 150 - 400 K/uL  Basic metabolic panel     Status: Abnormal   Collection Time: 05/21/15  4:20 AM  Result Value Ref Range   Sodium 142 135 - 145 mmol/L   Potassium 3.8 3.5 - 5.1 mmol/L   Chloride 105 101 - 111 mmol/L   CO2 29 22 - 32 mmol/L   Glucose, Bld 137 (H) 65 - 99 mg/dL   BUN 14 6 - 20 mg/dL    Creatinine, Ser 0.75 0.61 - 1.24 mg/dL   Calcium 8.8 (L) 8.9 - 10.3 mg/dL   GFR calc non Af Amer >60 >60 mL/min   GFR calc Af Amer >60 >60 mL/min    Comment: (NOTE) The eGFR has been calculated using the CKD EPI equation. This calculation has not been validated in all clinical situations. eGFR's persistently <60 mL/min signify possible Chronic Kidney Disease.    Anion gap 8 5 - 15  Basic metabolic panel     Status: Abnormal   Collection Time: 07/20/15  2:20 PM  Result Value Ref Range   Sodium 135 135 - 145 mmol/L   Potassium 3.8 3.5 - 5.1 mmol/L   Chloride 99 (L) 101 - 111 mmol/L   CO2 30 22 - 32 mmol/L   Glucose, Bld 97 65 - 99 mg/dL   BUN 14 6 - 20 mg/dL   Creatinine, Ser 1.29 (H) 0.61 - 1.24 mg/dL   Calcium 9.1 8.9 - 10.3 mg/dL   GFR calc non Af Amer >60 >60 mL/min   GFR calc Af Amer >60 >60 mL/min    Comment: (NOTE) The eGFR has been calculated using the CKD EPI equation. This calculation has not  been validated in all clinical situations. eGFR's persistently <60 mL/min signify possible Chronic Kidney Disease.    Anion gap 6 5 - 15  CBC with Differential     Status: None   Collection Time: 07/20/15  2:20 PM  Result Value Ref Range   WBC 5.7 4.0 - 10.5 K/uL   RBC 4.45 4.22 - 5.81 MIL/uL   Hemoglobin 13.5 13.0 - 17.0 g/dL   HCT 39.5 39.0 - 52.0 %   MCV 88.8 78.0 - 100.0 fL   MCH 30.3 26.0 - 34.0 pg   MCHC 34.2 30.0 - 36.0 g/dL   RDW 14.3 11.5 - 15.5 %   Platelets 210 150 - 400 K/uL   Neutrophils Relative % 75 43 - 77 %   Neutro Abs 4.2 1.7 - 7.7 K/uL   Lymphocytes Relative 16 12 - 46 %   Lymphs Abs 0.9 0.7 - 4.0 K/uL   Monocytes Relative 9 3 - 12 %   Monocytes Absolute 0.5 0.1 - 1.0 K/uL   Eosinophils Relative 0 0 - 5 %   Eosinophils Absolute 0.0 0.0 - 0.7 K/uL   Basophils Relative 0 0 - 1 %   Basophils Absolute 0.0 0.0 - 0.1 K/uL  Brain natriuretic peptide     Status: None   Collection Time: 07/20/15  2:20 PM  Result Value Ref Range   B Natriuretic Peptide  6.1 0.0 - 100.0 pg/mL  Troponin I     Status: None   Collection Time: 07/20/15  2:20 PM  Result Value Ref Range   Troponin I <0.03 <0.031 ng/mL    Comment:        NO INDICATION OF MYOCARDIAL INJURY.   Protime-INR     Status: None   Collection Time: 07/20/15  2:20 PM  Result Value Ref Range   Prothrombin Time 12.9 11.6 - 15.2 seconds   INR 0.95 0.00 - 1.49  CBC with Differential/Platelet     Status: Abnormal   Collection Time: 07/26/15 10:31 PM  Result Value Ref Range   WBC 6.3 4.0 - 10.5 K/uL   RBC 4.78 4.22 - 5.81 MIL/uL   Hemoglobin 14.6 13.0 - 17.0 g/dL   HCT 42.7 39.0 - 52.0 %   MCV 89.3 78.0 - 100.0 fL   MCH 30.5 26.0 - 34.0 pg   MCHC 34.2 30.0 - 36.0 g/dL   RDW 14.5 11.5 - 15.5 %   Platelets 255 150 - 400 K/uL   Neutrophils Relative % 79 (H) 43 - 77 %   Neutro Abs 4.9 1.7 - 7.7 K/uL   Lymphocytes Relative 14 12 - 46 %   Lymphs Abs 0.9 0.7 - 4.0 K/uL   Monocytes Relative 7 3 - 12 %   Monocytes Absolute 0.5 0.1 - 1.0 K/uL   Eosinophils Relative 0 0 - 5 %   Eosinophils Absolute 0.0 0.0 - 0.7 K/uL   Basophils Relative 0 0 - 1 %   Basophils Absolute 0.0 0.0 - 0.1 K/uL  Brain natriuretic peptide     Status: None   Collection Time: 07/26/15 10:31 PM  Result Value Ref Range   B Natriuretic Peptide 6.5 0.0 - 100.0 pg/mL  I-stat troponin, ED     Status: None   Collection Time: 07/26/15 10:38 PM  Result Value Ref Range   Troponin i, poc 0.00 0.00 - 0.08 ng/mL   Comment 3            Comment: Due to the release kinetics of cTnI,  a negative result within the first hours of the onset of symptoms does not rule out myocardial infarction with certainty. If myocardial infarction is still suspected, repeat the test at appropriate intervals.   I-stat chem 8, ed     Status: Abnormal   Collection Time: 07/26/15 10:40 PM  Result Value Ref Range   Sodium 138 135 - 145 mmol/L   Potassium 3.1 (L) 3.5 - 5.1 mmol/L   Chloride 98 (L) 101 - 111 mmol/L   BUN 15 6 - 20 mg/dL    Creatinine, Ser 1.10 0.61 - 1.24 mg/dL   Glucose, Bld 96 65 - 99 mg/dL   Calcium, Ion 1.11 (L) 1.12 - 1.23 mmol/L   TCO2 27 0 - 100 mmol/L   Hemoglobin 15.6 13.0 - 17.0 g/dL   HCT 46.0 39.0 - 52.0 %  Glucose, capillary     Status: Abnormal   Collection Time: 07/28/15 10:33 AM  Result Value Ref Range   Glucose-Capillary 124 (H) 65 - 99 mg/dL  Basic metabolic panel     Status: Abnormal   Collection Time: 07/29/15 11:51 AM  Result Value Ref Range   Sodium 134 (L) 135 - 145 mEq/L   Potassium 3.6 3.5 - 5.1 mEq/L   Chloride 98 96 - 112 mEq/L   CO2 28 19 - 32 mEq/L   Glucose, Bld 93 70 - 99 mg/dL   BUN 18 6 - 23 mg/dL   Creatinine, Ser 0.97 0.40 - 1.50 mg/dL   Calcium 9.7 8.4 - 10.5 mg/dL   GFR 85.86 >60.00 mL/min    Assessment/Plan: Volume overload Weight stable. Continue current regimen, adding on nightly Lasix dose. Continue potassium supplement. Follow-up with Cardiology as scheduled. Will assess potassium level once labs result and contact patient with further instructions. Follow-up will be based on results.

## 2015-08-17 NOTE — Assessment & Plan Note (Signed)
Weight stable. Continue current regimen, adding on nightly Lasix dose. Continue potassium supplement. Follow-up with Cardiology as scheduled. Will assess potassium level once labs result and contact patient with further instructions. Follow-up will be based on results.

## 2015-08-17 NOTE — Patient Instructions (Signed)
Please continue Lasix, taking the nighttime dose as directed. Please watch fluid intake and limit salt intake. Continue other medications as directed. Please go to appointment with Nephrology (Kidney MD) on 9/10 at 11 as scheduled. You will be contacted for assessment by Cardiology.  I will call you with your results and we will alter your regimen based on results. Continue potassium supplement.

## 2015-08-18 ENCOUNTER — Telehealth: Payer: Self-pay | Admitting: *Deleted

## 2015-08-18 ENCOUNTER — Ambulatory Visit: Payer: Medicare Other | Attending: Orthopedic Surgery | Admitting: Physical Therapy

## 2015-08-18 DIAGNOSIS — M25661 Stiffness of right knee, not elsewhere classified: Secondary | ICD-10-CM | POA: Diagnosis present

## 2015-08-18 DIAGNOSIS — M25561 Pain in right knee: Secondary | ICD-10-CM

## 2015-08-18 DIAGNOSIS — R269 Unspecified abnormalities of gait and mobility: Secondary | ICD-10-CM

## 2015-08-18 DIAGNOSIS — R262 Difficulty in walking, not elsewhere classified: Secondary | ICD-10-CM | POA: Diagnosis present

## 2015-08-18 MED ORDER — LEVOTHYROXINE SODIUM 175 MCG PO TABS
175.0000 ug | ORAL_TABLET | Freq: Every day | ORAL | Status: DC
Start: 2015-08-18 — End: 2015-11-09

## 2015-08-18 MED ORDER — POTASSIUM CHLORIDE CRYS ER 20 MEQ PO TBCR: 20.0000 meq | EXTENDED_RELEASE_TABLET | Freq: Every day | ORAL | Status: DC

## 2015-08-18 NOTE — Telephone Encounter (Signed)
Notified pt. He is unsure of medications. States he uses a pill box but is unsure of what he is really taking.  Our records indicate last Levothyroxine Rx 04/17/15 x 2 refills. Spoke with [facility] and pt last filled Rx in April 2016 only.  Spoke with [facility], pt hasn't filled levothyroxine with them since 2015.  Rx sent to [facility] for levothyroxine 156mcg and potassium refilled. Notified pt and he voices understanding. Pt has f/u with PCP 09/09/15.

## 2015-08-18 NOTE — Therapy (Signed)
[facility] [address]  [address] [place], [place], [address] Phone: [phone]   Fax:  [phone]  Physical Therapy Evaluation  Patient Details  Name: Kyle Blair MRN: [id number] Date of Birth: July 15, 1961 Referring Provider:  Paralee Cancel, MD  Encounter Date: 08/18/2015      PT End of Session - 08/18/15 1533    Visit Number 1   Number of Visits 14   Date for PT Re-Evaluation 09/29/15   PT Start Time 6387   PT Stop Time 1532   PT Time Calculation (min) 44 min      Past Medical History  Diagnosis Date  . Asthma   . Polycystic kidney disease   . Hypothyroid   . GERD with stricture   . BPH (benign prostatic hypertrophy)   . Hyperlipidemia   . Lung nodule 02/03/2012  . History of stroke 2004  . Stroke   . Arthritis   . Hypertension     Past Surgical History  Procedure Laterality Date  . Hernia repair  2005    with mesh  . Nasal polyp excision  1992 or 93  . Carpal tunnel release    . Knee arthroscopy Bilateral 12/05/13    cyst and bone spur removed from left knee per pt.  . Colonoscopy    . Polypectomy    . Knee arthroscopy Left 01/2015  . Total knee arthroplasty Right 05/19/2015    Procedure: RIGHT TOTAL KNEE ARTHROPLASTY;  Surgeon: Paralee Cancel, MD;  Location: [facility];  Service: Orthopedics;  Laterality: Right;    There were no vitals filed for this visit.  Visit Diagnosis:  Right knee pain  Knee stiffness, right  Difficulty walking  Abnormality of gait      Subjective Assessment - 08/18/15 1452    Subjective pt s/p R TKA May 2015.  Had 3 wks HHPT then had infection to R Knee amoung a wide range of other medical issues that pt states prevented him from using R LE.  Had MD f/u 2 weeks ago and due to poor progress with R knee function/ROM MD performd MUA on 08/13/15.   Patient Stated Goals walk and hike without pain   Currently in Pain? Yes   Pain Score --  4-5/10 on average, 8/10 at worst,  1-2/10 at best   Pain Location Knee   Pain Orientation Right   Pain Descriptors / Indicators Sharp   Pain Onset More than a month ago   Pain Frequency Constant            [facility] PT Assessment - 08/18/15 0001    Assessment   Medical Diagnosis R TKA   Onset Date/Surgical Date 05/19/15   Balance Screen   Has the patient fallen in the past 6 months Yes   How many times? 2   Has the patient had a decrease in activity level because of a fear of falling?  No   Is the patient reluctant to leave their home because of a fear of falling?  No   Home Environment   Living Environment Private residence   Living Arrangements Spouse/significant other;Children   Type of Home Apartment   Home Access Level entry   Prior Function   Leisure enjoys hiking and shooting   Observation/Other Assessments   Focus on Therapeutic Outcomes (FOTO)  68% limitation   ROM / Strength   AROM / PROM / Strength AROM;PROM;Strength   AROM   Overall AROM Comments R Knee 18-70  PROM   Overall PROM Comments R Knee 15-84         TODAY'S TREATMENT TherEx - NuStep lvl 4, 4' Leg Press 25# 20x (focus on increasing ROM) HEP instruction and perform (see HEP)         PT Education - 2015-09-09 1531    Education provided Yes   Education Details initial HEP   Person(s) Educated Patient   Methods Explanation;Demonstration;Handout   Comprehension Verbalized understanding;Returned demonstration          PT Short Term Goals - 2015-09-09 1538    PT SHORT TERM GOAL #1   Title R Knee PROM 5-100 by 09/01/15   Status New           PT Long Term Goals - 09-09-2015 1538    PT LONG TERM GOAL #1   Title R Knee AROM 5-110 or better by 09/29/15   Status New   PT LONG TERM GOAL #2   Title pt able to ambulate without need for AD with good mechanics by 09/29/15   Status New   PT LONG TERM GOAL #3   Title pt reports R knee pain no greater than 2/10 and infrequent in nature by 09/29/15   Status New                Plan - 09-09-15 1506    Clinical Impression Statement pt 3 months s/p R TKA.  Following surgery, pt had variety of medical complications that prevented participation in rehab for TKA.  He recently underwent manipulation under anesthesia to R knee and referred to OPPT.  R Knee PROM limited to 15-84 today and AROM 18-70.  Pt seems very motivated and am hopeful that he will progress quickly with ROM gains.   Pt will benefit from skilled therapeutic intervention in order to improve on the following deficits Pain;Postural dysfunction;Decreased mobility;Decreased strength;Improper body mechanics;Impaired flexibility;Hypomobility;Abnormal gait;Decreased range of motion;Difficulty walking   Rehab Potential Good   PT Frequency 2x / week  3x/wk for 1st 2wks for total of 14 treatments   PT Duration 6 weeks   PT Treatment/Interventions Therapeutic exercise;Therapeutic activities;Moist Heat;Cryotherapy;Electrical Stimulation;Stair training;Gait training;Balance training;Manual techniques;Taping;Vasopneumatic Device   PT Next Visit Plan R Knee ROM and [place]; Gait training; modalities PRN   Consulted and Agree with Plan of Care Patient          G-Codes - 09-09-2015 1540    Functional Assessment Tool Used foto 68% limitation   Functional Limitation Mobility: Walking and moving around   Mobility: Walking and Moving Around Current Status [phone]) At least 60 percent but less than 80 percent impaired, limited or restricted   Mobility: Walking and Moving Around Goal Status [phone]) At least 40 percent but less than 60 percent impaired, limited or restricted       Problem List Patient Active Problem List   Diagnosis Date Noted  . Nausea without vomiting 08/13/2015  . Volume overload 07/30/2015  . Indeterminate pulmonary nodules 07/23/2015  . Obese 05/20/2015  . S/P right TKA 05/19/2015  . S/P knee replacement 05/19/2015  . Preoperative evaluation to rule out surgical contraindication 04/15/2015  .  Undiagnosed cardiac murmurs 04/15/2015  . OA (osteoarthritis) of knee 09/13/2013  . Hx of adenomatous colonic polyps 05/08/2013  . Barrett's esophagus 05/03/2013  . Chronic diarrhea 04/02/2013  . HTN (hypertension) 04/02/2013  . Hx of low back pain 09/26/2012  . Knee joint pain, right 05/02/2012  . Lung nodule 02/03/2012  . Asthma with acute exacerbation 12/13/2011  .  BPH (benign prostatic hypertrophy) 12/13/2011  . Hyperlipidemia 12/13/2011  . GERD (gastroesophageal reflux disease) 12/13/2011  . Hypothyroid   . Polycystic kidney disease     Jaena Brocato PT, OCS 08/18/2015, 3:43 PM  [facility] [address]  [place], [place], [address] Phone: [phone]   Fax:  [phone]

## 2015-08-18 NOTE — Telephone Encounter (Signed)
-----   Message from Brunetta Jeans, PA-C sent at 08/18/2015  8:27 AM EDT ----- Lab results are in -- Renal function is stable. His liver enzymes are slightly elevated. Will have LFTs and Acute hep panel checked at follow-up visit. His potassium is borderline low so he needs to resume his Potassium supplement and take as directed. Ok to send in refill. Lastly I reviewed TSH results in Melissa's absence and it is severely elevated in 500 range. Is he taking his thyroid medication every day as directed? This is extremely important. If not, he needs to restart it. If he is taking as directed, will need to increase levothyroxine to 225 mcg daily. Follow-up 2 weeks for assessment of swelling and repeat labs.

## 2015-08-19 ENCOUNTER — Ambulatory Visit: Payer: Medicare Other | Admitting: Rehabilitation

## 2015-08-19 DIAGNOSIS — M25561 Pain in right knee: Secondary | ICD-10-CM | POA: Diagnosis not present

## 2015-08-19 DIAGNOSIS — R269 Unspecified abnormalities of gait and mobility: Secondary | ICD-10-CM

## 2015-08-19 DIAGNOSIS — R262 Difficulty in walking, not elsewhere classified: Secondary | ICD-10-CM

## 2015-08-19 DIAGNOSIS — M25661 Stiffness of right knee, not elsewhere classified: Secondary | ICD-10-CM

## 2015-08-19 NOTE — Therapy (Signed)
[facility] [address]  [place], [place], [address] Phone: [phone]   Fax:  [phone]  Physical Therapy Treatment  Patient Details  Name: Kyle Blair MRN: [id number] Date of Birth: 1961-01-03 Referring Provider:  Paralee Cancel, MD  Encounter Date: 08/19/2015      PT End of Session - 08/19/15 1645    Visit Number 2   Number of Visits 14   Date for PT Re-Evaluation 09/29/15   PT Start Time 6962   PT Stop Time 1725   PT Time Calculation (min) 40 min      Past Medical History  Diagnosis Date  . Asthma   . Polycystic kidney disease   . Hypothyroid   . GERD with stricture   . BPH (benign prostatic hypertrophy)   . Hyperlipidemia   . Lung nodule 02/03/2012  . History of stroke 2004  . Stroke   . Arthritis   . Hypertension     Past Surgical History  Procedure Laterality Date  . Hernia repair  2005    with mesh  . Nasal polyp excision  1992 or 93  . Carpal tunnel release    . Knee arthroscopy Bilateral 12/05/13    cyst and bone spur removed from left knee per pt.  . Colonoscopy    . Polypectomy    . Knee arthroscopy Left 01/2015  . Total knee arthroplasty Right 05/19/2015    Procedure: RIGHT TOTAL KNEE ARTHROPLASTY;  Surgeon: Paralee Cancel, MD;  Location: [facility];  Service: Orthopedics;  Laterality: Right;    There were no vitals filed for this visit.  Visit Diagnosis:  Right knee pain  Knee stiffness, right  Difficulty walking  Abnormality of gait      Subjective Assessment - 08/19/15 1648    Subjective Reports he hasn't done his exercises today but has been standing waxing his sons car for most of the day. Took some pain medication before to therapy.    Currently in Pain? Yes   Pain Score 4    Pain Location Knee   Pain Orientation Right      TODAY'S TREATMENT TherEx - NuStep lvl 4, 5' Leg Press 25# 20x (focus on increasing ROM, verbal cues to slow down speed and push through  heels) TRX DL Squats 15x (verbal cues for proper mechanics with pt needing repeated enforcement) Standing TKE with ball against wall 10x5"  PROM into Flexion and Extension to pt tolerance with and without pball  Seated Flexion Stretch with scooting forward 10x5" Standing with Rt foot on low row seat (lowest setting), forward lung for flexion stretch 10x3"  Manual- Patellar mobs grade 2-3 M-L, S-I Tibial AP/PA Grade 2-3 Contract Relax into Flexion to pt tolerance Knee Extension mobs grade 2-3 with knee extended.       PT Short Term Goals - 08/19/15 1651    PT SHORT TERM GOAL #1   Title R Knee PROM 5-100 by 09/01/15   Status On-going           PT Long Term Goals - 08/19/15 1652    PT LONG TERM GOAL #1   Title R Knee AROM 5-110 or better by 09/29/15   Status On-going   PT LONG TERM GOAL #2   Title pt able to ambulate without need for AD with good mechanics by 09/29/15   Status On-going   PT LONG TERM GOAL #3   Title pt reports R knee pain no greater than 2/10  and infrequent in nature by 09/29/15   Status On-going               Plan - 08/19/15 1726    Clinical Impression Statement Pt had a very difficult time relaxing during manual and PROM work today. Also needed copious cues for proper posture/mechanics with all exercises, especially with TRX squats. May try wall sits instead until pt learns proper mechanics. Encouraged pt to work on his HEP 2-3 times a day to assist with proper stretching.    PT Next Visit Plan R Knee ROM and Strenthening; Gait training; modalities PRN; wall sits   Consulted and Agree with Plan of Care Patient        Problem List Patient Active Problem List   Diagnosis Date Noted  . Nausea without vomiting 08/13/2015  . Volume overload 07/30/2015  . Indeterminate pulmonary nodules 07/23/2015  . Obese 05/20/2015  . S/P right TKA 05/19/2015  . S/P knee replacement 05/19/2015  . Preoperative evaluation to rule out surgical contraindication  04/15/2015  . Undiagnosed cardiac murmurs 04/15/2015  . OA (osteoarthritis) of knee 09/13/2013  . Hx of adenomatous colonic polyps 05/08/2013  . Barrett's esophagus 05/03/2013  . Chronic diarrhea 04/02/2013  . HTN (hypertension) 04/02/2013  . Hx of low back pain 09/26/2012  . Knee joint pain, right 05/02/2012  . Lung nodule 02/03/2012  . Asthma with acute exacerbation 12/13/2011  . BPH (benign prostatic hypertrophy) 12/13/2011  . Hyperlipidemia 12/13/2011  . GERD (gastroesophageal reflux disease) 12/13/2011  . Hypothyroid   . Polycystic kidney disease     Barbette Hair, [place] 08/19/2015, 5:32 PM  [facility] [address]  [place], [place], [address] Phone: [phone]   Fax:  [phone]

## 2015-08-20 ENCOUNTER — Ambulatory Visit: Payer: Medicare Other | Admitting: Rehabilitation

## 2015-08-20 DIAGNOSIS — M25561 Pain in right knee: Secondary | ICD-10-CM

## 2015-08-20 DIAGNOSIS — R269 Unspecified abnormalities of gait and mobility: Secondary | ICD-10-CM

## 2015-08-20 DIAGNOSIS — R262 Difficulty in walking, not elsewhere classified: Secondary | ICD-10-CM

## 2015-08-20 DIAGNOSIS — M25661 Stiffness of right knee, not elsewhere classified: Secondary | ICD-10-CM

## 2015-08-20 NOTE — Therapy (Signed)
[facility] [address]  [place], [place], [address] Phone: [phone]   Fax:  [phone]  Physical Therapy Treatment  Patient Details  Name: Kyle Blair MRN: [id number] Date of Birth: February 19, 1961 Referring Provider:  Paralee Cancel, MD  Encounter Date: 08/20/2015      PT End of Session - 08/20/15 1013    Visit Number 3   Number of Visits 14   Date for PT Re-Evaluation 09/29/15   PT Start Time 1010   PT Stop Time 1050   PT Time Calculation (min) 40 min      Past Medical History  Diagnosis Date  . Asthma   . Polycystic kidney disease   . Hypothyroid   . GERD with stricture   . BPH (benign prostatic hypertrophy)   . Hyperlipidemia   . Lung nodule 02/03/2012  . History of stroke 2004  . Stroke   . Arthritis   . Hypertension     Past Surgical History  Procedure Laterality Date  . Hernia repair  2005    with mesh  . Nasal polyp excision  1992 or 93  . Carpal tunnel release    . Knee arthroscopy Bilateral 12/05/13    cyst and bone spur removed from left knee per pt.  . Colonoscopy    . Polypectomy    . Knee arthroscopy Left 01/2015  . Total knee arthroplasty Right 05/19/2015    Procedure: RIGHT TOTAL KNEE ARTHROPLASTY;  Surgeon: Paralee Cancel, MD;  Location: [facility];  Service: Orthopedics;  Laterality: Right;    There were no vitals filed for this visit.  Visit Diagnosis:  Right knee pain  Knee stiffness, right  Difficulty walking  Abnormality of gait      Subjective Assessment - 08/20/15 1012    Subjective States he is feeling better than yesterday since he rested his knee. Thinks his knee is straightening out more now.    Currently in Pain? Yes   Pain Score 3    Pain Location Knee   Pain Orientation Right      TODAY'S TREATMENT TherEx - NuStep lvl 4, 5' (moving seat forward to increase knee flexion stretch) Supine Heel slides with strap 10x3" SAQ 0# 10x5", 2# 10x5" QS + Hip Ext  iso with 8" FR under ankles 10x5" (tactile cues for pt to contract quad muscles) PROM into Flexion and Extension to pt tolerance with and without pball  Door slide for knee flexion ROM 10x3" Standing TKE with ball against wall 10x5"  Standing with Rt foot on low row seat (lowest setting), forward lung for flexion stretch 10x5"  Manual- Patellar mobs grade 2-3 M-L, S-I Tibial AP/PA Grade 2-3 Knee Extension mobs grade 2-3 with knee extended.        PT Short Term Goals - 08/19/15 1651    PT SHORT TERM GOAL #1   Title R Knee PROM 5-100 by 09/01/15   Status On-going           PT Long Term Goals - 08/19/15 1652    PT LONG TERM GOAL #1   Title R Knee AROM 5-110 or better by 09/29/15   Status On-going   PT LONG TERM GOAL #2   Title pt able to ambulate without need for AD with good mechanics by 09/29/15   Status On-going   PT LONG TERM GOAL #3   Title pt reports R knee pain no greater than 2/10 and infrequent in nature by 09/29/15  Status On-going               Plan - 08/20/15 1038    Clinical Impression Statement Pt continunes to have a difficult time relaxing during PROM and manual work. Requires copious cues to relax and not resist the motion. Improved motion with wall sits compared to TRX squats yesterday but need cues to not lean trunk forward and allow LE's to perform the motion.     PT Next Visit Plan R Knee ROM and Strenthening; Gait training; modalities PRN   Consulted and Agree with Plan of Care Patient        Problem List Patient Active Problem List   Diagnosis Date Noted  . Nausea without vomiting 08/13/2015  . Volume overload 07/30/2015  . Indeterminate pulmonary nodules 07/23/2015  . Obese 05/20/2015  . S/P right TKA 05/19/2015  . S/P knee replacement 05/19/2015  . Preoperative evaluation to rule out surgical contraindication 04/15/2015  . Undiagnosed cardiac murmurs 04/15/2015  . OA (osteoarthritis) of knee 09/13/2013  . Hx of adenomatous colonic  polyps 05/08/2013  . Barrett's esophagus 05/03/2013  . Chronic diarrhea 04/02/2013  . HTN (hypertension) 04/02/2013  . Hx of low back pain 09/26/2012  . Knee joint pain, right 05/02/2012  . Lung nodule 02/03/2012  . Asthma with acute exacerbation 12/13/2011  . BPH (benign prostatic hypertrophy) 12/13/2011  . Hyperlipidemia 12/13/2011  . GERD (gastroesophageal reflux disease) 12/13/2011  . Hypothyroid   . Polycystic kidney disease     Barbette Hair, [place] 08/20/2015, 10:53 AM  [facility] [address]  [place], [place], [address] Phone: [phone]   Fax:  [phone]

## 2015-08-24 ENCOUNTER — Telehealth: Payer: Self-pay | Admitting: Family

## 2015-08-24 ENCOUNTER — Ambulatory Visit: Payer: Medicare Other | Admitting: Rehabilitation

## 2015-08-24 ENCOUNTER — Encounter: Payer: Self-pay | Admitting: Rehabilitation

## 2015-08-24 DIAGNOSIS — M25661 Stiffness of right knee, not elsewhere classified: Secondary | ICD-10-CM

## 2015-08-24 DIAGNOSIS — M25561 Pain in right knee: Secondary | ICD-10-CM

## 2015-08-24 DIAGNOSIS — R262 Difficulty in walking, not elsewhere classified: Secondary | ICD-10-CM

## 2015-08-24 MED ORDER — NYSTATIN 100000 UNIT/ML MT SUSP: 5.0000 mL | Freq: Four times a day (QID) | OROMUCOSAL | Status: DC

## 2015-08-24 NOTE — Therapy (Signed)
[facility] [address]  [place], [place], [address] Phone: [phone]   Fax:  [phone]  Physical Therapy Treatment  Patient Details  Name: Kyle Blair MRN: [id number] Date of Birth: [birth year] Referring Provider:  Paralee Cancel, MD  Encounter Date: 08/24/2015      PT End of Session - 08/24/15 1527    Visit Number 4   Number of Visits 14   Date for PT Re-Evaluation 09/29/15   PT Start Time 1500   PT Stop Time 1528   PT Time Calculation (min) 28 min   Equipment Utilized During Treatment Gait belt   Activity Tolerance Patient tolerated treatment well      Past Medical History  Diagnosis Date  . Asthma   . Polycystic kidney disease   . Hypothyroid   . GERD with stricture   . BPH (benign prostatic hypertrophy)   . Hyperlipidemia   . Lung nodule 02/03/2012  . History of stroke 2004  . Stroke   . Arthritis   . Hypertension     Past Surgical History  Procedure Laterality Date  . Hernia repair  2005    with mesh  . Nasal polyp excision  1992 or 93  . Carpal tunnel release    . Knee arthroscopy Bilateral 12/05/13    cyst and bone spur removed from left knee per pt.  . Colonoscopy    . Polypectomy    . Knee arthroscopy Left 01/2015  . Total knee arthroplasty Right 05/19/2015    Procedure: RIGHT TOTAL KNEE ARTHROPLASTY;  Surgeon: Paralee Cancel, MD;  Location: [facility];  Service: Orthopedics;  Laterality: Right;    There were no vitals filed for this visit.  Visit Diagnosis:  Right knee pain  Knee stiffness, right  Difficulty walking      Subjective Assessment - 08/24/15 1457    Subjective having issues with edema globally.  will be consulting cardiologist and kidney MD soon.  Feels that this is making stiffness worse.  [place] x 1 hour and had increased soreness   Currently in Pain? Yes   Pain Score 6    Pain Location Knee   Pain Orientation Right     pt arriving 12min late  today  TODAY'S TREATMENT TherEx - NuStep lvl 4, 6' (moving seat forward to increase knee flexion stretch) SAQ  2# 10x5" QS + Hip Ext iso with 8" FR under ankles 10x5" (tactile cues for pt to contract quad muscles) Door slide for knee flexion ROM 10x3" Standing TKE with ball against wall 10x5"  Standing with Rt foot on low row seat (lowest setting), forward lung for flexion stretch 10x5" Standing calf stretch x 60"  Gait cueing with SPC and without SPC to increase knee flexion and extension during swing and then stance.    Manual- PROM into flexion to tolerance.  Extension mobilization grade IV in full extension   AROM 20-65 today with pain                            PT Short Term Goals - 08/19/15 1651    PT SHORT TERM GOAL #1   Title R Knee PROM 5-100 by 09/01/15   Status On-going           PT Long Term Goals - 08/19/15 1652    PT LONG TERM GOAL #1   Title R Knee AROM 5-110 or better by  09/29/15   Status On-going   PT LONG TERM GOAL #2   Title pt able to ambulate without need for AD with good mechanics by 09/29/15   Status On-going   PT LONG TERM GOAL #3   Title pt reports R knee pain no greater than 2/10 and infrequent in nature by 09/29/15   Status On-going               Plan - 08/24/15 1518    Clinical Impression Statement pt with overall stiffness today continuing.  reports it is worse due to swelling today.  able to improve heel to toe gait pattern with cueing today   PT Next Visit Plan R Knee ROM and Strenthening; Gait training; modalities PRN        Problem List Patient Active Problem List   Diagnosis Date Noted  . Nausea without vomiting 08/13/2015  . Volume overload 07/30/2015  . Indeterminate pulmonary nodules 07/23/2015  . Obese 05/20/2015  . S/P right TKA 05/19/2015  . S/P knee replacement 05/19/2015  . Preoperative evaluation to rule out surgical contraindication 04/15/2015  . Undiagnosed cardiac murmurs 04/15/2015   . OA (osteoarthritis) of knee 09/13/2013  . Hx of adenomatous colonic polyps 05/08/2013  . Barrett's esophagus 05/03/2013  . Chronic diarrhea 04/02/2013  . HTN (hypertension) 04/02/2013  . Hx of low back pain 09/26/2012  . Knee joint pain, right 05/02/2012  . Lung nodule 02/03/2012  . Asthma with acute exacerbation 12/13/2011  . BPH (benign prostatic hypertrophy) 12/13/2011  . Hyperlipidemia 12/13/2011  . GERD (gastroesophageal reflux disease) 12/13/2011  . Hypothyroid   . Polycystic kidney disease     Stark Bray, DPT, CMP 08/24/2015, 3:29 PM  [facility] [address]  [place], [place], [address] Phone: [phone]   Fax:  [phone]

## 2015-08-24 NOTE — Telephone Encounter (Signed)
Ok to send refill  

## 2015-08-24 NOTE — Telephone Encounter (Signed)
Relation to pt: self  Call back [phone] Pharmacy: [facility], [place] - [address] [phone] (Phone) [phone] (Fax         Reason for call:  Patient requesting a refill nystatin (MYCOSTATIN) 100000 UNIT/ML suspension  Due to patient expercicing reoccuring thrush.

## 2015-08-24 NOTE — Telephone Encounter (Signed)
Spoke with Pt, informed him that he will need appt to see Melissa for steroid pack. Offered to schedule Pt an appt, which Pt declined to schedule at this time. Informed him to call office and schedule appt if not getting better in several days. Pt verbalized understanding.

## 2015-08-24 NOTE — Telephone Encounter (Signed)
Last Rx 08/11/15.  Please advise.

## 2015-08-24 NOTE — Telephone Encounter (Signed)
Rx sent.  Notified pt. He is requesting dose pack of prednisone as he "has no energy and feels weak".  Has appts with kidney and heart doctors coming up.  Please advise.

## 2015-08-24 NOTE — Telephone Encounter (Signed)
I will need to see him prior to prescribing steroids. Please arrange Office visit.

## 2015-08-25 ENCOUNTER — Encounter: Payer: Self-pay | Admitting: Physician Assistant

## 2015-08-25 ENCOUNTER — Ambulatory Visit (INDEPENDENT_AMBULATORY_CARE_PROVIDER_SITE_OTHER): Payer: Medicare Other | Admitting: Physician Assistant

## 2015-08-25 VITALS — BP 98/72 | HR 80 | Temp 98.0°F | Resp 18 | Ht 65.0 in | Wt 233.1 lb

## 2015-08-25 DIAGNOSIS — J4541 Moderate persistent asthma with (acute) exacerbation: Secondary | ICD-10-CM

## 2015-08-25 MED ORDER — METHYLPREDNISOLONE ACETATE 80 MG/ML IJ SUSP: 80.0000 mg | Freq: Once | INTRAMUSCULAR | Status: DC

## 2015-08-25 MED ORDER — PREDNISONE 20 MG PO TABS: 40.0000 mg | ORAL_TABLET | Freq: Every day | ORAL | Status: DC

## 2015-08-25 MED ORDER — ALPRAZOLAM 0.25 MG PO TABS: 0.2500 mg | ORAL_TABLET | Freq: Two times a day (BID) | ORAL | Status: DC | PRN

## 2015-08-25 NOTE — Patient Instructions (Signed)
Please continue medications as directed.  The shot given today should help improve your breathing. Make sure to keep cool and take chronic asthma medications.  Continue Lasix as directed along with potassium supplement. I will try to expedite your appointment with Cardiology. Please stay calm and take Xanax as directed if needed for anxiety.

## 2015-08-25 NOTE — Progress Notes (Signed)
Pre visit review using our clinic review tool, if applicable. No additional management support is needed unless otherwise documented below in the visit note/SLS  

## 2015-08-26 NOTE — Assessment & Plan Note (Signed)
Im depomedrol given. Prednisone burst x 3 days given to start tomorrow. Continue chronic medications. Humidifier in bedroom. Continue other medications as directed. Will attempt to expedite appointment with Cards for hypervolemia but this seems well controlled today.

## 2015-08-26 NOTE — Progress Notes (Signed)
Patient presents to clinic today c/o chest tightness and wheezing despite use of asthma medications. Denies productive cough or chest congestion. Has been taking Lasix as directed but still with some mild orthopnea. Has upcoming appointment with Cardiology but is requesting it be made sooner. Denies chest pain, palpitations or increased swelling with Lasix. Is anxious because he is trying hard to keep fluid off of legs.  Past Medical History  Diagnosis Date  . Asthma   . Polycystic kidney disease   . Hypothyroid   . GERD with stricture   . BPH (benign prostatic hypertrophy)   . Hyperlipidemia   . Lung nodule 02/03/2012  . History of stroke 2004  . Stroke   . Arthritis   . Hypertension     Current Outpatient Prescriptions on File Prior to Visit  Medication Sig Dispense Refill  . acetaminophen (TYLENOL) 325 MG tablet Take 1-2 tablets (325-650 mg total) by mouth every 6 (six) hours as needed.    Marland Kitchen albuterol (PROVENTIL HFA;VENTOLIN HFA) 108 (90 BASE) MCG/ACT inhaler Inhale 2 puffs into the lungs every 4 (four) hours as needed for wheezing or shortness of breath. 1 Inhaler 5  . albuterol (PROVENTIL) (2.5 MG/3ML) 0.083% nebulizer solution Take 3 mLs (2.5 mg total) by nebulization every 6 (six) hours as needed for wheezing or shortness of breath. 75 mL 3  . amLODipine (NORVASC) 5 MG tablet TAKE 1 TABLET(5 MG) BY MOUTH DAILY 30 tablet 2  . benazepril (LOTENSIN) 10 MG tablet TAKE 1 TABLET(10 MG) BY MOUTH DAILY 30 tablet 2  . diphenhydrAMINE (BENADRYL) 25 MG tablet Take 25 mg by mouth at bedtime as needed for sleep.     Marland Kitchen docusate sodium (COLACE) 100 MG capsule Take 1 capsule (100 mg total) by mouth 2 (two) times daily. 10 capsule 0  . Fluticasone-Salmeterol (ADVAIR) 250-50 MCG/DOSE AEPB Inhale 1 puff into the lungs 2 (two) times daily.    . furosemide (LASIX) 20 MG tablet Take 1 tablet (20 mg total) by mouth 2 (two) times daily. (Patient taking differently: Take 20 mg by mouth 3 (three) times  daily. ) 60 tablet 3  . levothyroxine (SYNTHROID, LEVOTHROID) 175 MCG tablet Take 1 tablet (175 mcg total) by mouth daily before breakfast. 30 tablet 2  . lovastatin (MEVACOR) 40 MG tablet TAKE 1 TABLET(40 MG) BY MOUTH AT BEDTIME 90 tablet 1  . methocarbamol (ROBAXIN) 500 MG tablet Take 1 tablet (500 mg total) by mouth every 6 (six) hours as needed for muscle spasms. 50 tablet 0  . montelukast (SINGULAIR) 10 MG tablet TAKE 1 TABLET BY MOUTH EVERY NIGHT AT BEDTIME (Patient taking differently: TAKE 1 TABLET BY MOUTH EVERY MORNING.) 90 tablet 1  . nystatin (MYCOSTATIN) 100000 UNIT/ML suspension Take 5 mLs (500,000 Units total) by mouth 4 (four) times daily. 240 mL 0  . omeprazole (PRILOSEC) 40 MG capsule TAKE 1 CAPSULE BY MOUTH EVERY DAY 90 capsule 1  . oxyCODONE (ROXICODONE) 15 MG immediate release tablet Take 15 mg by mouth every 4 (four) hours as needed for pain.    . polyethylene glycol (MIRALAX / GLYCOLAX) packet Take 17 g by mouth 2 (two) times daily. (Patient taking differently: Take 17 g by mouth 2 (two) times daily as needed. ) 14 each 0  . potassium chloride SA (K-DUR,KLOR-CON) 20 MEQ tablet Take 1 tablet (20 mEq total) by mouth daily. 30 tablet 1  . rivaroxaban (XARELTO) 10 MG TABS tablet Take 1 tablet (10 mg total) by mouth daily. 14 tablet  0  . tamsulosin (FLOMAX) 0.4 MG CAPS capsule TAKE 1 CAPSULE(0.4 MG) BY MOUTH AT BEDTIME 90 capsule 1   No current facility-administered medications on file prior to visit.    Allergies  Allergen Reactions  . Aspirin Anaphylaxis  . Doxycycline Hyclate Swelling    Facial Swelling  . Ibuprofen Anaphylaxis  . Peanut Butter Flavor Anaphylaxis  . Cephalexin Rash    Family History  Problem Relation Age of Onset  . Heart disease Mother     pacemaker  . Diabetes Father   . Kidney disease Father   . Diabetes Sister   . Kidney disease Sister   . Colon cancer Neg Hx   . Esophageal cancer Neg Hx   . Rectal cancer Neg Hx   . Stomach cancer Neg Hx     . Prostate cancer Neg Hx   . Pancreatic cancer Neg Hx     Social History   Social History  . Marital Status: Married    Spouse Name: N/A  . Number of Children: 2  . Years of Education: N/A   Occupational History  . DISABLED    Social History Main Topics  . Smoking status: Former Smoker -- 1.00 packs/day for 20 years    Types: Cigarettes    Quit date: 03/15/2015  . Smokeless tobacco: Never Used  . Alcohol Use: 0.0 oz/week    0 Standard drinks or equivalent per week     Comment: rarely  . Drug Use: No  . Sexual Activity: Not Asked   Other Topics Concern  . None   Social History Narrative   Caffeine use:  1 pot coffee daily, 1 glass tea   Regular exercise:  No. Has active lifestyle.   2 biological and 1 stepson.   Former smoker- quit in 2007   He is on disability- reports that he has a history of heat stroke.               Review of Systems - See HPI.  All other ROS are negative.  BP 98/72 mmHg  Pulse 80  Temp(Src) 98 F (36.7 C) (Oral)  Resp 18  Ht '5\' 5"'  (1.651 m)  Wt 233 lb 2 oz (105.745 kg)  BMI 38.79 kg/m2  SpO2 97%  Physical Exam  Constitutional: He is oriented to person, place, and time and well-developed, well-nourished, and in no distress.  HENT:  Head: Normocephalic and atraumatic.  Eyes: Conjunctivae are normal.  Neck: Neck supple.  Cardiovascular: Normal rate, regular rhythm, normal heart sounds and intact distal pulses.   Pulses:      Popliteal pulses are 2+ on the right side, and 2+ on the left side.       Dorsalis pedis pulses are 2+ on the right side, and 2+ on the left side.       Posterior tibial pulses are 2+ on the right side, and 2+ on the left side.  Negative for edema.  Pulmonary/Chest: No respiratory distress. He has wheezes. He has no rales. He exhibits no tenderness.  Neurological: He is alert and oriented to person, place, and time.  Skin: Skin is warm and dry. No rash noted.  Psychiatric: Affect normal.    Recent Results  (from the past 2160 hour(s))  Basic metabolic panel     Status: Abnormal   Collection Time: 07/20/15  2:20 PM  Result Value Ref Range   Sodium 135 135 - 145 mmol/L   Potassium 3.8 3.5 - 5.1 mmol/L   Chloride  99 (L) 101 - 111 mmol/L   CO2 30 22 - 32 mmol/L   Glucose, Bld 97 65 - 99 mg/dL   BUN 14 6 - 20 mg/dL   Creatinine, Ser 1.29 (H) 0.61 - 1.24 mg/dL   Calcium 9.1 8.9 - 10.3 mg/dL   GFR calc non Af Amer >60 >60 mL/min   GFR calc Af Amer >60 >60 mL/min    Comment: (NOTE) The eGFR has been calculated using the CKD EPI equation. This calculation has not been validated in all clinical situations. eGFR's persistently <60 mL/min signify possible Chronic Kidney Disease.    Anion gap 6 5 - 15  CBC with Differential     Status: None   Collection Time: 07/20/15  2:20 PM  Result Value Ref Range   WBC 5.7 4.0 - 10.5 K/uL   RBC 4.45 4.22 - 5.81 MIL/uL   Hemoglobin 13.5 13.0 - 17.0 g/dL   HCT 39.5 39.0 - 52.0 %   MCV 88.8 78.0 - 100.0 fL   MCH 30.3 26.0 - 34.0 pg   MCHC 34.2 30.0 - 36.0 g/dL   RDW 14.3 11.5 - 15.5 %   Platelets 210 150 - 400 K/uL   Neutrophils Relative % 75 43 - 77 %   Neutro Abs 4.2 1.7 - 7.7 K/uL   Lymphocytes Relative 16 12 - 46 %   Lymphs Abs 0.9 0.7 - 4.0 K/uL   Monocytes Relative 9 3 - 12 %   Monocytes Absolute 0.5 0.1 - 1.0 K/uL   Eosinophils Relative 0 0 - 5 %   Eosinophils Absolute 0.0 0.0 - 0.7 K/uL   Basophils Relative 0 0 - 1 %   Basophils Absolute 0.0 0.0 - 0.1 K/uL  Brain natriuretic peptide     Status: None   Collection Time: 07/20/15  2:20 PM  Result Value Ref Range   B Natriuretic Peptide 6.1 0.0 - 100.0 pg/mL  Troponin I     Status: None   Collection Time: 07/20/15  2:20 PM  Result Value Ref Range   Troponin I <0.03 <0.031 ng/mL    Comment:        NO INDICATION OF MYOCARDIAL INJURY.   Protime-INR     Status: None   Collection Time: 07/20/15  2:20 PM  Result Value Ref Range   Prothrombin Time 12.9 11.6 - 15.2 seconds   INR 0.95 0.00 -  1.49  CBC with Differential/Platelet     Status: Abnormal   Collection Time: 07/26/15 10:31 PM  Result Value Ref Range   WBC 6.3 4.0 - 10.5 K/uL   RBC 4.78 4.22 - 5.81 MIL/uL   Hemoglobin 14.6 13.0 - 17.0 g/dL   HCT 42.7 39.0 - 52.0 %   MCV 89.3 78.0 - 100.0 fL   MCH 30.5 26.0 - 34.0 pg   MCHC 34.2 30.0 - 36.0 g/dL   RDW 14.5 11.5 - 15.5 %   Platelets 255 150 - 400 K/uL   Neutrophils Relative % 79 (H) 43 - 77 %   Neutro Abs 4.9 1.7 - 7.7 K/uL   Lymphocytes Relative 14 12 - 46 %   Lymphs Abs 0.9 0.7 - 4.0 K/uL   Monocytes Relative 7 3 - 12 %   Monocytes Absolute 0.5 0.1 - 1.0 K/uL   Eosinophils Relative 0 0 - 5 %   Eosinophils Absolute 0.0 0.0 - 0.7 K/uL   Basophils Relative 0 0 - 1 %   Basophils Absolute 0.0 0.0 - 0.1 K/uL  Brain natriuretic peptide     Status: None   Collection Time: 07/26/15 10:31 PM  Result Value Ref Range   B Natriuretic Peptide 6.5 0.0 - 100.0 pg/mL  I-stat troponin, ED     Status: None   Collection Time: 07/26/15 10:38 PM  Result Value Ref Range   Troponin i, poc 0.00 0.00 - 0.08 ng/mL   Comment 3            Comment: Due to the release kinetics of cTnI, a negative result within the first hours of the onset of symptoms does not rule out myocardial infarction with certainty. If myocardial infarction is still suspected, repeat the test at appropriate intervals.   I-stat chem 8, ed     Status: Abnormal   Collection Time: 07/26/15 10:40 PM  Result Value Ref Range   Sodium 138 135 - 145 mmol/L   Potassium 3.1 (L) 3.5 - 5.1 mmol/L   Chloride 98 (L) 101 - 111 mmol/L   BUN 15 6 - 20 mg/dL   Creatinine, Ser 1.10 0.61 - 1.24 mg/dL   Glucose, Bld 96 65 - 99 mg/dL   Calcium, Ion 1.11 (L) 1.12 - 1.23 mmol/L   TCO2 27 0 - 100 mmol/L   Hemoglobin 15.6 13.0 - 17.0 g/dL   HCT 46.0 39.0 - 52.0 %  Glucose, capillary     Status: Abnormal   Collection Time: 07/28/15 10:33 AM  Result Value Ref Range   Glucose-Capillary 124 (H) 65 - 99 mg/dL  Basic metabolic  panel     Status: Abnormal   Collection Time: 07/29/15 11:51 AM  Result Value Ref Range   Sodium 134 (L) 135 - 145 mEq/L   Potassium 3.6 3.5 - 5.1 mEq/L   Chloride 98 96 - 112 mEq/L   CO2 28 19 - 32 mEq/L   Glucose, Bld 93 70 - 99 mg/dL   BUN 18 6 - 23 mg/dL   Creatinine, Ser 0.97 0.40 - 1.50 mg/dL   Calcium 9.7 8.4 - 10.5 mg/dL   GFR 85.86 >60.00 mL/min  Comprehensive metabolic panel     Status: Abnormal   Collection Time: 08/17/15  2:44 PM  Result Value Ref Range   Sodium 137 135 - 145 mEq/L   Potassium 3.4 (L) 3.5 - 5.1 mEq/L   Chloride 96 96 - 112 mEq/L   CO2 31 19 - 32 mEq/L   Glucose, Bld 103 (H) 70 - 99 mg/dL   BUN 15 6 - 23 mg/dL   Creatinine, Ser 1.09 0.40 - 1.50 mg/dL   Total Bilirubin 0.6 0.2 - 1.2 mg/dL   Alkaline Phosphatase 69 39 - 117 U/L   AST 67 (H) 0 - 37 U/L   ALT 42 0 - 53 U/L   Total Protein 7.3 6.0 - 8.3 g/dL   Albumin 4.9 3.5 - 5.2 g/dL   Calcium 9.9 8.4 - 10.5 mg/dL   GFR 75.03 >60.00 mL/min  TSH     Status: Abnormal   Collection Time: 08/17/15  2:50 PM  Result Value Ref Range   TSH 562.491 (H) 0.350 - 4.500 uIU/mL    Comment: Result repeated and verified. Result confirmed by automatic dilution.     Assessment/Plan: Asthma with acute exacerbation Im depomedrol given. Prednisone burst x 3 days given to start tomorrow. Continue chronic medications. Humidifier in bedroom. Continue other medications as directed. Will attempt to expedite appointment with Cards for hypervolemia but this seems well controlled today.

## 2015-08-27 ENCOUNTER — Ambulatory Visit: Payer: Medicare Other | Attending: Orthopedic Surgery | Admitting: Rehabilitation

## 2015-08-27 DIAGNOSIS — M25561 Pain in right knee: Secondary | ICD-10-CM | POA: Diagnosis not present

## 2015-08-27 DIAGNOSIS — R262 Difficulty in walking, not elsewhere classified: Secondary | ICD-10-CM | POA: Diagnosis present

## 2015-08-27 DIAGNOSIS — R269 Unspecified abnormalities of gait and mobility: Secondary | ICD-10-CM

## 2015-08-27 DIAGNOSIS — M25661 Stiffness of right knee, not elsewhere classified: Secondary | ICD-10-CM

## 2015-08-27 NOTE — Therapy (Signed)
[facility] [address]  [place], [place], [address] Phone: [phone]   Fax:  [phone]  Physical Therapy Treatment  Patient Details  Name: Kyle Blair MRN: [id number] Date of Birth: 31-May-1961 Referring Provider:  Paralee Cancel, MD  Encounter Date: 08/27/2015      PT End of Session - 08/27/15 1105    Visit Number 5   Number of Visits 14   Date for PT Re-Evaluation 09/29/15   PT Start Time 6160   PT Stop Time 1143   PT Time Calculation (min) 38 min      Past Medical History  Diagnosis Date  . Asthma   . Polycystic kidney disease   . Hypothyroid   . GERD with stricture   . BPH (benign prostatic hypertrophy)   . Hyperlipidemia   . Lung nodule 02/03/2012  . History of stroke 2004  . Stroke   . Arthritis   . Hypertension     Past Surgical History  Procedure Laterality Date  . Hernia repair  2005    with mesh  . Nasal polyp excision  1992 or 93  . Carpal tunnel release    . Knee arthroscopy Bilateral 12/05/13    cyst and bone spur removed from left knee per pt.  . Colonoscopy    . Polypectomy    . Knee arthroscopy Left 01/2015  . Total knee arthroplasty Right 05/19/2015    Procedure: RIGHT TOTAL KNEE ARTHROPLASTY;  Surgeon: Paralee Cancel, MD;  Location: [facility];  Service: Orthopedics;  Laterality: Right;    There were no vitals filed for this visit.  Visit Diagnosis:  Right knee pain  Knee stiffness, right  Difficulty walking  Abnormality of gait      Subjective Assessment - 08/27/15 1107    Subjective Denies knee pain today but states he feels like the swelling is going down. Pain at worse in the past week: 8.5/10, at best: 2-3/10.   Currently in Pain? No/denies      TODAY'S TREATMENT TherEx -  NuStep lvl 5, 5' (moving seat forward to increase knee flexion stretch) AROM/PROM for MD note  Manual- PROM into flexion and extension to tolerance Extension mobilization grade II-III in  full extension Tibial AP/PA mobilizations grade II-III with knee bent to full flexion   TherEx -  Standing TKE with ball against wall 10x5" Door slide for knee flexion ROM 10x3"  6" Fwd Step ups with TKE at top 10x with 2 pole assist Standing with Rt foot on low row seat (lowest setting), forward lung for flexion stretch 12x5" Seated Max flexion then scooting fwd 10x5" Door slide for knee flexion ROM 10x3"   AROM 18-74 PROM 14-85       PT Short Term Goals - 08/19/15 1651    PT SHORT TERM GOAL #1   Title R Knee PROM 5-100 by 09/01/15   Status On-going           PT Long Term Goals - 08/19/15 1652    PT LONG TERM GOAL #1   Title R Knee AROM 5-110 or better by 09/29/15   Status On-going   PT LONG TERM GOAL #2   Title pt able to ambulate without need for AD with good mechanics by 09/29/15   Status On-going   PT LONG TERM GOAL #3   Title pt reports R knee pain no greater than 2/10 and infrequent in nature by 09/29/15   Status On-going  Plan - 08/27/15 1123    Clinical Impression Statement Pt has attended 5 sessions of PT so far and has made slight progress with ROM (2-3 degrees increase). Pt requires verbal and tactiles cues for proper quad activation during TKE exercises and has a very difficult time relaxing during maual work. Pt continues to walk with [facility] and is slowly improving his heel/toe pattern. Pt asked to not perform too many exericse lying down due to difficulty breathing today.    PT Next Visit Plan R Knee ROM and Strenthening; Gait training; modalities PRN   Consulted and Agree with Plan of Care Patient        Problem List Patient Active Problem List   Diagnosis Date Noted  . Nausea without vomiting 08/13/2015  . Volume overload 07/30/2015  . Indeterminate pulmonary nodules 07/23/2015  . Obese 05/20/2015  . S/P right TKA 05/19/2015  . S/P knee replacement 05/19/2015  . Preoperative evaluation to rule out surgical contraindication  04/15/2015  . Undiagnosed cardiac murmurs 04/15/2015  . OA (osteoarthritis) of knee 09/13/2013  . Hx of adenomatous colonic polyps 05/08/2013  . Barrett's esophagus 05/03/2013  . Chronic diarrhea 04/02/2013  . HTN (hypertension) 04/02/2013  . Hx of low back pain 09/26/2012  . Knee joint pain, right 05/02/2012  . Lung nodule 02/03/2012  . Asthma with acute exacerbation 12/13/2011  . BPH (benign prostatic hypertrophy) 12/13/2011  . Hyperlipidemia 12/13/2011  . GERD (gastroesophageal reflux disease) 12/13/2011  . Hypothyroid   . Polycystic kidney disease     Barbette Hair, [place] 08/27/2015, 11:40 AM  [facility] [address]  [place], [place], [address] Phone: [phone]   Fax:  [phone]

## 2015-08-28 ENCOUNTER — Ambulatory Visit: Payer: Medicare Other | Admitting: Rehabilitation

## 2015-08-28 DIAGNOSIS — M25561 Pain in right knee: Secondary | ICD-10-CM

## 2015-08-28 DIAGNOSIS — R262 Difficulty in walking, not elsewhere classified: Secondary | ICD-10-CM

## 2015-08-28 DIAGNOSIS — M25661 Stiffness of right knee, not elsewhere classified: Secondary | ICD-10-CM

## 2015-08-28 DIAGNOSIS — R269 Unspecified abnormalities of gait and mobility: Secondary | ICD-10-CM

## 2015-08-28 NOTE — Therapy (Signed)
[facility] [address]  [place], [place], [address] Phone: [phone]   Fax:  [phone]  Physical Therapy Treatment  Patient Details  Name: Kyle Blair MRN: [id number] Date of Birth: 07-07-1961 Referring Provider:  Paralee Cancel, MD  Encounter Date: 08/28/2015      PT End of Session - 08/28/15 0929    Visit Number 6   Number of Visits 14   Date for PT Re-Evaluation 09/29/15   PT Start Time 0925   PT Stop Time 1005   PT Time Calculation (min) 40 min      Past Medical History  Diagnosis Date  . Asthma   . Polycystic kidney disease   . Hypothyroid   . GERD with stricture   . BPH (benign prostatic hypertrophy)   . Hyperlipidemia   . Lung nodule 02/03/2012  . History of stroke 2004  . Stroke   . Arthritis   . Hypertension     Past Surgical History  Procedure Laterality Date  . Hernia repair  2005    with mesh  . Nasal polyp excision  1992 or 93  . Carpal tunnel release    . Knee arthroscopy Bilateral 12/05/13    cyst and bone spur removed from left knee per pt.  . Colonoscopy    . Polypectomy    . Knee arthroscopy Left 01/2015  . Total knee arthroplasty Right 05/19/2015    Procedure: RIGHT TOTAL KNEE ARTHROPLASTY;  Surgeon: Paralee Cancel, MD;  Location: [facility];  Service: Orthopedics;  Laterality: Right;    There were no vitals filed for this visit.  Visit Diagnosis:  Right knee pain  Knee stiffness, right  Difficulty walking  Abnormality of gait      Subjective Assessment - 08/28/15 0928    Subjective Saw the PA yesterday and he seems ok with his progress and gave him a prescription for 4 more weeks. States he walking into the clinic without his cane today and felt funny walking without it.    Currently in Pain? No/denies       TODAY'S TREATMENT TherEx -  NuStep lvl 5, 6' (moving seat forward to increase knee flexion stretch) Leg Press 25# 20x (focus on increasing ROM, verbal  cues to slow down speed and push through heels) Standing with Rt foot on low row seat (lowest setting), forward lung for flexion stretch 12x5" Standing TKE with ball against wall 10x5" Door slide for knee flexion ROM 12x5"  Seated hamstring stretch with QS 10"x5  Manual- PROM into flexion and extension to tolerance Patellar mobs grade 2-3 M-L, S-I Extension mobilization grade II-III in full extension Tibial AP/PA mobilizations grade II-III with knee bent to full flexion        PT Short Term Goals - 08/19/15 1651    PT SHORT TERM GOAL #1   Title R Knee PROM 5-100 by 09/01/15   Status On-going           PT Long Term Goals - 08/19/15 1652    PT LONG TERM GOAL #1   Title R Knee AROM 5-110 or better by 09/29/15   Status On-going   PT LONG TERM GOAL #2   Title pt able to ambulate without need for AD with good mechanics by 09/29/15   Status On-going   PT LONG TERM GOAL #3   Title pt reports R knee pain no greater than 2/10 and infrequent in nature by 09/29/15   Status On-going  Plan - 08/28/15 0946    Clinical Impression Statement Pt seemed more motivated today and had better mechanics with exercises, requiring only minimal cues. Still needed cues to relax during PROM and stretches/manual work. Encouraged pt to continue with HEP and stretching every day, multiple times a day.    PT Next Visit Plan R Knee ROM and Strenthening; Gait training; modalities PRN   Consulted and Agree with Plan of Care Patient        Problem List Patient Active Problem List   Diagnosis Date Noted  . Nausea without vomiting 08/13/2015  . Volume overload 07/30/2015  . Indeterminate pulmonary nodules 07/23/2015  . Obese 05/20/2015  . S/P right TKA 05/19/2015  . S/P knee replacement 05/19/2015  . Preoperative evaluation to rule out surgical contraindication 04/15/2015  . Undiagnosed cardiac murmurs 04/15/2015  . OA (osteoarthritis) of knee 09/13/2013  . Hx of adenomatous  colonic polyps 05/08/2013  . Barrett's esophagus 05/03/2013  . Chronic diarrhea 04/02/2013  . HTN (hypertension) 04/02/2013  . Hx of low back pain 09/26/2012  . Knee joint pain, right 05/02/2012  . Lung nodule 02/03/2012  . Asthma with acute exacerbation 12/13/2011  . BPH (benign prostatic hypertrophy) 12/13/2011  . Hyperlipidemia 12/13/2011  . GERD (gastroesophageal reflux disease) 12/13/2011  . Hypothyroid   . Polycystic kidney disease     Barbette Hair, [place] 08/28/2015, 10:06 AM  [facility] [address]  [place], [place], [address] Phone: [phone]   Fax:  [phone]

## 2015-09-02 ENCOUNTER — Ambulatory Visit: Payer: Medicare Other | Admitting: Rehabilitation

## 2015-09-02 ENCOUNTER — Ambulatory Visit (INDEPENDENT_AMBULATORY_CARE_PROVIDER_SITE_OTHER): Payer: Medicare Other | Admitting: Family

## 2015-09-02 ENCOUNTER — Encounter: Payer: Self-pay | Admitting: Family

## 2015-09-02 VITALS — BP 132/78 | HR 78 | Temp 98.0°F | Resp 18 | Ht 65.0 in | Wt 228.0 lb

## 2015-09-02 DIAGNOSIS — M25561 Pain in right knee: Secondary | ICD-10-CM

## 2015-09-02 DIAGNOSIS — R269 Unspecified abnormalities of gait and mobility: Secondary | ICD-10-CM

## 2015-09-02 DIAGNOSIS — R262 Difficulty in walking, not elsewhere classified: Secondary | ICD-10-CM

## 2015-09-02 DIAGNOSIS — M25661 Stiffness of right knee, not elsewhere classified: Secondary | ICD-10-CM

## 2015-09-02 DIAGNOSIS — J45901 Unspecified asthma with (acute) exacerbation: Secondary | ICD-10-CM

## 2015-09-02 MED ORDER — PREDNISONE 10 MG PO TABS: ORAL_TABLET | ORAL | Status: DC

## 2015-09-02 MED ORDER — DOCUSATE SODIUM 100 MG PO CAPS: 100.0000 mg | ORAL_CAPSULE | Freq: Every day | ORAL | Status: DC

## 2015-09-02 MED ORDER — METHYLPREDNISOLONE SODIUM SUCC 125 MG IJ SOLR
125.0000 mg | Freq: Once | INTRAMUSCULAR | Status: AC
  Administered 2015-09-02: 125 mg via INTRAMUSCULAR

## 2015-09-02 NOTE — Patient Instructions (Signed)
Start prednisone. Continue albuterol every 6 hours. Call if your breathing/wheezing worsens or if not improved in 2-3 days.

## 2015-09-02 NOTE — Therapy (Signed)
[facility] [address]  [place], [place], [address] Phone: [phone]   Fax:  [phone]  Physical Therapy Treatment  Patient Details  Name: Kyle Blair MRN: [id number] Date of Birth: [birth year] Referring Brittan Butterbaugh:  Paralee Cancel, MD  Encounter Date: 09/02/2015      PT End of Session - 09/02/15 0857    Visit Number 7   Number of Visits 14   Date for PT Re-Evaluation 09/29/15   PT Start Time 5009   PT Stop Time 0924   PT Time Calculation (min) 40 min      Past Medical History  Diagnosis Date  . Asthma   . Polycystic kidney disease   . Hypothyroid   . GERD with stricture   . BPH (benign prostatic hypertrophy)   . Hyperlipidemia   . Lung nodule 02/03/2012  . History of stroke 2004  . Stroke   . Arthritis   . Hypertension     Past Surgical History  Procedure Laterality Date  . Hernia repair  2005    with mesh  . Nasal polyp excision  1992 or 93  . Carpal tunnel release    . Knee arthroscopy Bilateral 12/05/13    cyst and bone spur removed from left knee per pt.  . Colonoscopy    . Polypectomy    . Knee arthroscopy Left 01/2015  . Total knee arthroplasty Right 05/19/2015    Procedure: RIGHT TOTAL KNEE ARTHROPLASTY;  Surgeon: Paralee Cancel, MD;  Location: [facility];  Service: Orthopedics;  Laterality: Right;    There were no vitals filed for this visit.  Visit Diagnosis:  Right knee pain  Knee stiffness, right  Difficulty walking  Abnormality of gait      Subjective Assessment - 09/02/15 0855    Subjective Denies pain today but continues to note stiffness. States he is trying to walk more without the cane but advised him this is ok for short distances but we don't want him walking with a limp. Noted chest tightness this morning due to his asthma and will be seeing his MD this afternoon.    Currently in Pain? No/denies      TODAY'S TREATMENT TherEx -  NuStep lvl 5, 5' (moving seat  forward to increase knee flexion stretch) Wall sits 12x5"  DL Leg Press 25# 2x15 with 3 second pause for stretch  Standing TKE with Black TB 15x5" with verbal and tactile cues for proper motion and avoiding his compensation Prone Quad stretch 4x20" (pt had a hard time maintaining position)  Manual- PROM into flexion and extension to tolerance Extension mobilization grade II-III in full extension Tibial AP/PA mobilizations grade II-III with knee bent to full flexion  Patellar mobs M-L, S-I  TherEx -  Standing with Rt foot on low row seat (lowest setting), forward lung for flexion stretch 15x5" 8" Fwd step ups with 1 pole assist x10 (cues to not circumduct Rt LE onto step) 4" Eccentric Reach downs x10 with 2 pole assist      PT Short Term Goals - 08/19/15 1651    PT SHORT TERM GOAL #1   Title R Knee PROM 5-100 by 09/01/15   Status On-going           PT Long Term Goals - 08/19/15 1652    PT LONG TERM GOAL #1   Title R Knee AROM 5-110 or better by 09/29/15   Status On-going   PT LONG TERM GOAL #2  Title pt able to ambulate without need for AD with good mechanics by 09/29/15   Status On-going   PT LONG TERM GOAL #3   Title pt reports R knee pain no greater than 2/10 and infrequent in nature by 09/29/15   Status On-going               Plan - 09/02/15 0914    Clinical Impression Statement Needed copious cues during standing TKE with band today to prevent hip compensation. Noted good stretch with prone quad stretch but pt did have a hard time mantaining the position. Improved tolerance to manual work with pt able to relax more.    PT Next Visit Plan R Knee ROM and Strenthening; Gait training; modalities PRN. Give standing TKE as HEP if pt able to perform without copious cues.    Consulted and Agree with Plan of Care Patient        Problem List Patient Active Problem List   Diagnosis Date Noted  . Nausea without vomiting 08/13/2015  . Volume overload 07/30/2015  .  Indeterminate pulmonary nodules 07/23/2015  . Obese 05/20/2015  . S/P right TKA 05/19/2015  . S/P knee replacement 05/19/2015  . Preoperative evaluation to rule out surgical contraindication 04/15/2015  . Undiagnosed cardiac murmurs 04/15/2015  . OA (osteoarthritis) of knee 09/13/2013  . Hx of adenomatous colonic polyps 05/08/2013  . Barrett's esophagus 05/03/2013  . Chronic diarrhea 04/02/2013  . HTN (hypertension) 04/02/2013  . Hx of low back pain 09/26/2012  . Knee joint pain, right 05/02/2012  . Lung nodule 02/03/2012  . Asthma with acute exacerbation 12/13/2011  . BPH (benign prostatic hypertrophy) 12/13/2011  . Hyperlipidemia 12/13/2011  . GERD (gastroesophageal reflux disease) 12/13/2011  . Hypothyroid   . Polycystic kidney disease     Barbette Hair, [place] 09/02/2015, 9:25 AM  [facility] [address]  [place], [place], [address] Phone: [phone]   Fax:  [phone]

## 2015-09-02 NOTE — Progress Notes (Signed)
Pre visit review using our clinic review tool, if applicable. No additional management support is needed unless otherwise documented below in the visit note. 

## 2015-09-02 NOTE — Progress Notes (Signed)
Subjective:    Patient ID: Kyle Blair, male    DOB: 04/04/1961, 54 y.o.   MRN: [id number]  HPI  Kyle Blair is a 54 yr old male who presents today with report of chest tightness.  Notes that breathing tightened up yesterday after he walked past a flowering bush. He is using an albuterol inhaler without much improvement. He uses advair, "religiously."   Wt Readings from Last 3 Encounters:  09/02/15 228 lb (103.42 kg)  08/25/15 233 lb 2 oz (105.745 kg)  08/17/15 235 lb 6 oz (106.765 kg)     Review of Systems See HPI  Past Medical History  Diagnosis Date  . Asthma   . Polycystic kidney disease   . Hypothyroid   . GERD with stricture   . BPH (benign prostatic hypertrophy)   . Hyperlipidemia   . Lung nodule 02/03/2012  . History of stroke 2004  . Stroke   . Arthritis   . Hypertension     Social History   Social History  . Marital Status: Married    Spouse Name: N/A  . Number of Children: 2  . Years of Education: N/A   Occupational History  . DISABLED    Social History Main Topics  . Smoking status: Former Smoker -- 1.00 packs/day for 20 years    Types: Cigarettes    Quit date: 03/15/2015  . Smokeless tobacco: Never Used  . Alcohol Use: 0.0 oz/week    0 Standard drinks or equivalent per week     Comment: rarely  . Drug Use: No  . Sexual Activity: Not on file   Other Topics Concern  . Not on file   Social History Narrative   Caffeine use:  1 pot coffee daily, 1 glass tea   Regular exercise:  No. Has active lifestyle.   2 biological and 1 stepson.   Former smoker- quit in 2007   He is on disability- reports that he has a history of heat stroke.               Past Surgical History  Procedure Laterality Date  . Hernia repair  2005    with mesh  . Nasal polyp excision  1992 or 93  . Carpal tunnel release    . Knee arthroscopy Bilateral 12/05/13    cyst and bone spur removed from left knee per pt.  . Colonoscopy    . Polypectomy    . Knee  arthroscopy Left 01/2015  . 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 Heart disease Mother     pacemaker  . Diabetes Father   . Kidney disease Father   . Diabetes Sister   . Kidney disease Sister   . Colon cancer Neg Hx   . Esophageal cancer Neg Hx   . Rectal cancer Neg Hx   . Stomach cancer Neg Hx   . Prostate cancer Neg Hx   . Pancreatic cancer Neg Hx     Allergies  Allergen Reactions  . Aspirin Anaphylaxis  . Doxycycline Hyclate Swelling    Facial Swelling  . Ibuprofen Anaphylaxis  . Peanut Butter Flavor Anaphylaxis  . Cephalexin Rash    Current Outpatient Prescriptions on File Prior to Visit  Medication Sig Dispense Refill  . acetaminophen (TYLENOL) 325 MG tablet Take 1-2  tablets (325-650 mg total) by mouth every 6 (six) hours as needed.    Marland Kitchen albuterol (PROVENTIL HFA;VENTOLIN HFA) 108 (90 BASE) MCG/ACT inhaler Inhale 2 puffs into the lungs every 4 (four) hours as needed for wheezing or shortness of breath. 1 Inhaler 5  . albuterol (PROVENTIL) (2.5 MG/3ML) 0.083% nebulizer solution Take 3 mLs (2.5 mg total) by nebulization every 6 (six) hours as needed for wheezing or shortness of breath. 75 mL 3  . ALPRAZolam (XANAX) 0.25 MG tablet Take 1 tablet (0.25 mg total) by mouth 2 (two) times daily as needed for anxiety. 30 tablet 0  . amLODipine (NORVASC) 5 MG tablet TAKE 1 TABLET(5 MG) BY MOUTH DAILY 30 tablet 2  . benazepril (LOTENSIN) 10 MG tablet TAKE 1 TABLET(10 MG) BY MOUTH DAILY 30 tablet 2  . diphenhydrAMINE (BENADRYL) 25 MG tablet Take 25 mg by mouth at bedtime as needed for sleep.     Marland Kitchen Fluticasone-Salmeterol (ADVAIR) 250-50 MCG/DOSE AEPB Inhale 1 puff into the lungs 2 (two) times daily.    . furosemide (LASIX) 20 MG tablet Take 1 tablet (20 mg total) by mouth 2 (two) times daily. (Patient  taking differently: Take 20 mg by mouth 3 (three) times daily. ) 60 tablet 3  . levothyroxine (SYNTHROID, LEVOTHROID) 175 MCG tablet Take 1 tablet (175 mcg total) by mouth daily before breakfast. 30 tablet 2  . lovastatin (MEVACOR) 40 MG tablet TAKE 1 TABLET(40 MG) BY MOUTH AT BEDTIME 90 tablet 1  . methocarbamol (ROBAXIN) 500 MG tablet Take 1 tablet (500 mg total) by mouth every 6 (six) hours as needed for muscle spasms. 50 tablet 0  . montelukast (SINGULAIR) 10 MG tablet TAKE 1 TABLET BY MOUTH EVERY NIGHT AT BEDTIME (Patient taking differently: TAKE 1 TABLET BY MOUTH EVERY MORNING.) 90 tablet 1  . nystatin (MYCOSTATIN) 100000 UNIT/ML suspension Take 5 mLs (500,000 Units total) by mouth 4 (four) times daily. 240 mL 0  . omeprazole (PRILOSEC) 40 MG capsule TAKE 1 CAPSULE BY MOUTH EVERY DAY 90 capsule 1  . oxyCODONE (ROXICODONE) 15 MG immediate release tablet Take 15 mg by mouth every 4 (four) hours as needed for pain.    . polyethylene glycol (MIRALAX / GLYCOLAX) packet Take 17 g by mouth 2 (two) times daily. (Patient taking differently: Take 17 g by mouth 2 (two) times daily as needed. ) 14 each 0  . potassium chloride SA (K-DUR,KLOR-CON) 20 MEQ tablet Take 1 tablet (20 mEq total) by mouth daily. 30 tablet 1  . rivaroxaban (XARELTO) 10 MG TABS tablet Take 1 tablet (10 mg total) by mouth daily. 14 tablet 0  . tamsulosin (FLOMAX) 0.4 MG CAPS capsule TAKE 1 CAPSULE(0.4 MG) BY MOUTH AT BEDTIME 90 capsule 1   Current Facility-Administered Medications on File Prior to Visit  Medication Dose Route Frequency Provider Last Rate Last Dose  . methylPREDNISolone acetate (DEPO-MEDROL) injection 80 mg  80 mg Intramuscular Once Brunetta Jeans, PA-C        BP 132/78 mmHg  Pulse 78  Temp(Src) 98 F (36.7 C) (Oral)  Resp 18  Ht 5\' 5"  (1.651 m)  Wt 228 lb (103.42 kg)  BMI 37.94 kg/m2  SpO2 95%       Objective:   Physical Exam  Constitutional: He is oriented to person, place, and time. He appears  well-developed and well-nourished. No distress.  HENT:  Head: Normocephalic and atraumatic.  Cardiovascular: Normal rate and regular rhythm.   No murmur heard. 1+ bilateral LE edema  Pulmonary/Chest: Effort normal and breath sounds normal. No respiratory distress. He has no wheezes. He has no rales.  Neurological: He is alert and oriented to person, place, and time.  Skin: Skin is warm and dry.  Psychiatric: He has a normal mood and affect. His behavior is normal. Thought content normal.          Assessment & Plan:

## 2015-09-03 ENCOUNTER — Ambulatory Visit: Payer: Medicare Other | Admitting: Rehabilitation

## 2015-09-03 DIAGNOSIS — R269 Unspecified abnormalities of gait and mobility: Secondary | ICD-10-CM

## 2015-09-03 DIAGNOSIS — M25561 Pain in right knee: Secondary | ICD-10-CM

## 2015-09-03 DIAGNOSIS — R262 Difficulty in walking, not elsewhere classified: Secondary | ICD-10-CM

## 2015-09-03 DIAGNOSIS — M25661 Stiffness of right knee, not elsewhere classified: Secondary | ICD-10-CM

## 2015-09-03 NOTE — Therapy (Signed)
[facility] [address]  [place], [place], [address] Phone: [phone]   Fax:  [phone]  Physical Therapy Treatment  Patient Details  Name: Kyle Blair MRN: [id number] Date of Birth: Apr 08, 1961 Referring Provider:  Paralee Cancel, MD  Encounter Date: 09/03/2015      PT End of Session - 09/03/15 1312    Visit Number 8   Number of Visits 14   Date for PT Re-Evaluation 09/29/15   PT Start Time 4010   PT Stop Time 2725   PT Time Calculation (min) 40 min      Past Medical History  Diagnosis Date  . Asthma   . Polycystic kidney disease   . Hypothyroid   . GERD with stricture   . BPH (benign prostatic hypertrophy)   . Hyperlipidemia   . Lung nodule 02/03/2012  . History of stroke 2004  . Stroke   . Arthritis   . Hypertension     Past Surgical History  Procedure Laterality Date  . Hernia repair  2005    with mesh  . Nasal polyp excision  1992 or 93  . Carpal tunnel release    . Knee arthroscopy Bilateral 12/05/13    cyst and bone spur removed from left knee per pt.  . Colonoscopy    . Polypectomy    . Knee arthroscopy Left 01/2015  . Total knee arthroplasty Right 05/19/2015    Procedure: RIGHT TOTAL KNEE ARTHROPLASTY;  Surgeon: Paralee Cancel, MD;  Location: [facility];  Service: Orthopedics;  Laterality: Right;    There were no vitals filed for this visit.  Visit Diagnosis:  Right knee pain  Knee stiffness, right  Difficulty walking  Abnormality of gait      Subjective Assessment - 09/03/15 1314    Subjective Reports he feels his knee is getting straighter and is moving better. Denies pain currently and states he hasn't taken pain medication since this morning.    Currently in Pain? No/denies            [facility] PT Assessment - 09/03/15 1312    ROM / Strength   AROM / PROM / Strength AROM   AROM   Overall AROM Comments Rt knee 16-70  77 after manual work       TODAY'S  TREATMENT TherEx -  ROM check Prone Quad stretch 4x20"   Manual- PROM into flexion and extension to tolerance Extension mobilization grade II-III in full extension Tibial AP/PA mobilizations grade II-III with knee bent to full flexion  Patellar mobs M-L, S-I Supine contract/relax into knee Flexion with pt's knee over PTA's arm  TherEx -  Standing with Rt foot on low row seat (lowest setting), forward lung for flexion stretch 15x5" Wall sits 12x5"  Standing TKE with Black TB 15x5" with verbal and tactile cues for proper motion and avoiding his compensation DL Leg Press 35# 20x with 3 second pause for stretch Seated QS + overpressure 10x5"      PT Education - 09/03/15 1341    Education provided Yes   Education Details HEP   Person(s) Educated Patient   Methods Explanation;Demonstration;Handout   Comprehension Verbalized understanding;Returned demonstration          PT Short Term Goals - 08/19/15 1651    PT SHORT TERM GOAL #1   Title R Knee PROM 5-100 by 09/01/15   Status On-going           PT Long Term Goals -  08/19/15 1652    PT LONG TERM GOAL #1   Title R Knee AROM 5-110 or better by 09/29/15   Status On-going   PT LONG TERM GOAL #2   Title pt able to ambulate without need for AD with good mechanics by 09/29/15   Status On-going   PT LONG TERM GOAL #3   Title pt reports R knee pain no greater than 2/10 and infrequent in nature by 09/29/15   Status On-going               Plan - 09/03/15 1346    Clinical Impression Statement Increased HEP today as pt was able to perform TKE with less cues today. Performed more manual work today to try and help ROM due to minimal progress to date. Reinforced with pt that stretching and performing his HEP is important and some sort of stretching should be done every hour. He states he stays busy and thinks that helps but instructed him to perform stretches.    PT Next Visit Plan R Knee ROM and Strenthening; Gait training;  modalities PRN. Give standing TKE as HEP if pt able to perform without copious cues.    Consulted and Agree with Plan of Care Patient        Problem List Patient Active Problem List   Diagnosis Date Noted  . Nausea without vomiting 08/13/2015  . Volume overload 07/30/2015  . Indeterminate pulmonary nodules 07/23/2015  . Obese 05/20/2015  . S/P right TKA 05/19/2015  . S/P knee replacement 05/19/2015  . Preoperative evaluation to rule out surgical contraindication 04/15/2015  . Undiagnosed cardiac murmurs 04/15/2015  . OA (osteoarthritis) of knee 09/13/2013  . Hx of adenomatous colonic polyps 05/08/2013  . Barrett's esophagus 05/03/2013  . Chronic diarrhea 04/02/2013  . HTN (hypertension) 04/02/2013  . Hx of low back pain 09/26/2012  . Knee joint pain, right 05/02/2012  . Lung nodule 02/03/2012  . Asthma with acute exacerbation 12/13/2011  . BPH (benign prostatic hypertrophy) 12/13/2011  . Hyperlipidemia 12/13/2011  . GERD (gastroesophageal reflux disease) 12/13/2011  . Hypothyroid   . Polycystic kidney disease     Barbette Hair, [place] 09/03/2015, 1:52 PM  [facility] [address]  [place], [place], [address] Phone: [phone]   Fax:  [phone]

## 2015-09-03 NOTE — Assessment & Plan Note (Addendum)
Uncontrolled. We discussed referral to pulmonology and he declines at this time due to cost.  Will repeat steroid pack.  Continue advair, singulair, albuterol Q6 hours.   In regards to volume status, pt stopped lasix 20mg  tid because he felt that he was getting "too dry."  I would like him to resume bid to prevent recurrent fluid build up.  He verbalizes understanding.

## 2015-09-07 ENCOUNTER — Ambulatory Visit: Payer: Medicare Other | Admitting: Physical Therapy

## 2015-09-07 DIAGNOSIS — R262 Difficulty in walking, not elsewhere classified: Secondary | ICD-10-CM

## 2015-09-07 DIAGNOSIS — M25561 Pain in right knee: Secondary | ICD-10-CM | POA: Diagnosis not present

## 2015-09-07 DIAGNOSIS — R269 Unspecified abnormalities of gait and mobility: Secondary | ICD-10-CM

## 2015-09-07 DIAGNOSIS — M25661 Stiffness of right knee, not elsewhere classified: Secondary | ICD-10-CM

## 2015-09-07 NOTE — Therapy (Signed)
[facility] [address]  [place], [place], [address] Phone: [phone]   Fax:  [phone]  Physical Therapy Treatment  Patient Details  Name: Kyle Blair MRN: [id number] Date of Birth: 10-17-1961 Referring Provider:  Paralee Cancel, MD  Encounter Date: 09/07/2015      PT End of Session - 09/07/15 0935    Visit Number 9   Number of Visits 14   Date for PT Re-Evaluation 09/29/15   PT Start Time 0932   PT Stop Time 1018   PT Time Calculation (min) 46 min      Past Medical History  Diagnosis Date  . Asthma   . Polycystic kidney disease   . Hypothyroid   . GERD with stricture   . BPH (benign prostatic hypertrophy)   . Hyperlipidemia   . Lung nodule 02/03/2012  . History of stroke 2004  . Stroke   . Arthritis   . Hypertension     Past Surgical History  Procedure Laterality Date  . Hernia repair  2005    with mesh  . Nasal polyp excision  1992 or 93  . Carpal tunnel release    . Knee arthroscopy Bilateral 12/05/13    cyst and bone spur removed from left knee per pt.  . Colonoscopy    . Polypectomy    . Knee arthroscopy Left 01/2015  . Total knee arthroplasty Right 05/19/2015    Procedure: RIGHT TOTAL KNEE ARTHROPLASTY;  Surgeon: Paralee Cancel, MD;  Location: [facility];  Service: Orthopedics;  Laterality: Right;    There were no vitals filed for this visit.  Visit Diagnosis:  Right knee pain  Knee stiffness, right  Difficulty walking  Abnormality of gait      Subjective Assessment - 09/07/15 0934    Subjective Pt states he feels as though knee is improving and has noted that he can get leg under dining table now.  He states he's lost 10 pounds in the past week which he states is due to decreased fluid retention.   Currently in Pain? Yes   Pain Score 3   2-3/10   Pain Location Knee   Pain Orientation Right            [facility] PT Assessment - 09/07/15 0001    AROM   Overall AROM Comments  R knee 12-85   PROM   Overall PROM Comments R Knee 7-88     TODAY'S TREATMENT TherEx - NuStep lvl 5, 5' (increasing knee flexion throughout exercise) B FOB Tuck/Bridge combo for knee ROM 15x  Manual - R Knee Flexion and Extension mobes grade 4, R Patellar mobes, contract/relax into knee flex and extension  TherEx - B SAQ 10x3" Standing TKE ball into wall 20x3" Rocker Ankle DF and Knee Ext Stretch 3x20" TRX DL Squat with heel raise combo 20x with focus on full ROM (good performance) B Knee Flexion Machine 15# 15x (B Concentric, R Eccentric) 8" FW Step-up on R with Single Pole A 15x Seated Fitter Knee Flexion 1 Blue 12x Seated Fitter Kne Extension 2 Blue 12x                           PT Short Term Goals - 08/19/15 1651    PT SHORT TERM GOAL #1   Title R Knee PROM 5-100 by 09/01/15   Status On-going           PT Long Term  Goals - 08/19/15 1652    PT LONG TERM GOAL #1   Title R Knee AROM 5-110 or better by 09/29/15   Status On-going   PT LONG TERM GOAL #2   Title pt able to ambulate without need for AD with good mechanics by 09/29/15   Status On-going   PT LONG TERM GOAL #3   Title pt reports R knee pain no greater than 2/10 and infrequent in nature by 09/29/15   Status On-going               Plan - 09/07/15 0957    Clinical Impression Statement Best AROM and PROM to date but still quite restricted.  Tolerated treatment well but does require frequent VC with more complex exercises.   PT Next Visit Plan R Knee ROM and Strenthening; Gait training; modalities PRN.   Consulted and Agree with Plan of Care Patient        Problem List Patient Active Problem List   Diagnosis Date Noted  . Nausea without vomiting 08/13/2015  . Volume overload 07/30/2015  . Indeterminate pulmonary nodules 07/23/2015  . Obese 05/20/2015  . S/P right TKA 05/19/2015  . S/P knee replacement 05/19/2015  . Preoperative evaluation to rule out surgical contraindication  04/15/2015  . Undiagnosed cardiac murmurs 04/15/2015  . OA (osteoarthritis) of knee 09/13/2013  . Hx of adenomatous colonic polyps 05/08/2013  . Barrett's esophagus 05/03/2013  . Chronic diarrhea 04/02/2013  . HTN (hypertension) 04/02/2013  . Hx of low back pain 09/26/2012  . Knee joint pain, right 05/02/2012  . Lung nodule 02/03/2012  . Asthma with acute exacerbation 12/13/2011  . BPH (benign prostatic hypertrophy) 12/13/2011  . Hyperlipidemia 12/13/2011  . GERD (gastroesophageal reflux disease) 12/13/2011  . Hypothyroid   . Polycystic kidney disease     Tanice Petre PT, OCS 09/07/2015, 10:19 AM  [facility] [address]  [place], [place], [address] Phone: [phone]   Fax:  [phone]

## 2015-09-09 ENCOUNTER — Encounter: Payer: Self-pay | Admitting: Family

## 2015-09-09 ENCOUNTER — Ambulatory Visit (INDEPENDENT_AMBULATORY_CARE_PROVIDER_SITE_OTHER): Payer: Medicare Other | Admitting: Family

## 2015-09-09 ENCOUNTER — Ambulatory Visit: Payer: Medicare Other | Admitting: Physical Therapy

## 2015-09-09 VITALS — BP 121/82 | HR 88 | Temp 98.3°F | Resp 18 | Ht 65.0 in | Wt 220.0 lb

## 2015-09-09 DIAGNOSIS — J4541 Moderate persistent asthma with (acute) exacerbation: Secondary | ICD-10-CM | POA: Diagnosis not present

## 2015-09-09 DIAGNOSIS — M25561 Pain in right knee: Secondary | ICD-10-CM | POA: Diagnosis not present

## 2015-09-09 DIAGNOSIS — E8770 Fluid overload, unspecified: Secondary | ICD-10-CM | POA: Diagnosis not present

## 2015-09-09 DIAGNOSIS — E876 Hypokalemia: Secondary | ICD-10-CM

## 2015-09-09 DIAGNOSIS — Z23 Encounter for immunization: Secondary | ICD-10-CM

## 2015-09-09 DIAGNOSIS — R262 Difficulty in walking, not elsewhere classified: Secondary | ICD-10-CM

## 2015-09-09 DIAGNOSIS — R269 Unspecified abnormalities of gait and mobility: Secondary | ICD-10-CM

## 2015-09-09 DIAGNOSIS — M25661 Stiffness of right knee, not elsewhere classified: Secondary | ICD-10-CM

## 2015-09-09 DIAGNOSIS — E039 Hypothyroidism, unspecified: Secondary | ICD-10-CM | POA: Diagnosis not present

## 2015-09-09 LAB — COMPREHENSIVE METABOLIC PANEL
ALT: 16 U/L (ref 0–53)
AST: 14 U/L (ref 0–37)
Albumin: 4.7 g/dL (ref 3.5–5.2)
Alkaline Phosphatase: 66 U/L (ref 39–117)
BILIRUBIN TOTAL: 0.6 mg/dL (ref 0.2–1.2)
BUN: 19 mg/dL (ref 6–23)
CALCIUM: 9.7 mg/dL (ref 8.4–10.5)
CHLORIDE: 100 meq/L (ref 96–112)
CO2: 31 mEq/L (ref 19–32)
CREATININE: 0.94 mg/dL (ref 0.40–1.50)
GFR: 88.99 mL/min (ref >60.00)
Glucose, Bld: 94 mg/dL (ref 70–99)
Potassium: 3.6 mEq/L (ref 3.5–5.1)
Sodium: 140 mEq/L (ref 135–145)
TOTAL PROTEIN: 7.2 g/dL (ref 6.0–8.3)

## 2015-09-09 NOTE — Progress Notes (Signed)
Pre visit review using our clinic review tool, if applicable. No additional management support is needed unless otherwise documented below in the visit note. 

## 2015-09-09 NOTE — Progress Notes (Signed)
Subjective:    Patient ID: Kyle Blair, male    DOB: 1961-02-14, 54 y.o.   MRN: [id number]  HPI   Mr. Theiler is a 54 yr old male who presents today for follow up.    Asthma exacerbation-  Reports breathing is much better following pred pak.   Volume overload- pt is on lasix 20 mg bid.  Reports that he is feeling "too dry."   Wt Readings from Last 3 Encounters:  09/09/15 220 lb (99.791 kg)  09/02/15 228 lb (103.42 kg)  08/25/15 233 lb 2 oz (105.745 kg)     Review of Systems See HPI  Past Medical History  Diagnosis Date  . Asthma   . Polycystic kidney disease   . Hypothyroid   . GERD with stricture   . BPH (benign prostatic hypertrophy)   . Hyperlipidemia   . Lung nodule 02/03/2012  . History of stroke 2004  . Stroke   . Arthritis   . Hypertension     Social History   Social History  . Marital Status: Married    Spouse Name: N/A  . Number of Children: 2  . Years of Education: N/A   Occupational History  . DISABLED    Social History Main Topics  . Smoking status: Former Smoker -- 1.00 packs/day for 20 years    Types: Cigarettes    Quit date: 03/15/2015  . Smokeless tobacco: Never Used  . Alcohol Use: 0.0 oz/week    0 Standard drinks or equivalent per week     Comment: rarely  . Drug Use: No  . Sexual Activity: Not on file   Other Topics Concern  . Not on file   Social History Narrative   Caffeine use:  1 pot coffee daily, 1 glass tea   Regular exercise:  No. Has active lifestyle.   2 biological and 1 stepson.   Former smoker- quit in 2007   He is on disability- reports that he has a history of heat stroke.               Past Surgical History  Procedure Laterality Date  . Hernia repair  2005    with mesh  . Nasal polyp excision  1992 or 93  . Carpal tunnel release    . Knee arthroscopy Bilateral 12/05/13    cyst and bone spur removed from left knee per pt.  . Colonoscopy    . Polypectomy    . Knee arthroscopy Left 01/2015  . Total  knee arthroplasty Right 05/19/2015    Procedure: RIGHT TOTAL KNEE ARTHROPLASTY;  Surgeon: Paralee Cancel, MD;  Location: [facility];  Service: Orthopedics;  Laterality: Right;    Family History  Problem Relation Age of Onset  . Heart disease Mother     pacemaker  . Diabetes Father   . Kidney disease Father   . Diabetes Sister   . Kidney disease Sister   . Colon cancer Neg Hx   . Esophageal cancer Neg Hx   . Rectal cancer Neg Hx   . Stomach cancer Neg Hx   . Prostate cancer Neg Hx   . Pancreatic cancer Neg Hx     Allergies  Allergen Reactions  . Aspirin Anaphylaxis  . Doxycycline Hyclate Swelling    Facial Swelling  . Ibuprofen Anaphylaxis  . Peanut Butter Flavor Anaphylaxis  . Cephalexin Rash    Current Outpatient Prescriptions on File Prior to Visit  Medication Sig Dispense Refill  . acetaminophen (TYLENOL) 325 MG  tablet Take 1-2 tablets (325-650 mg total) by mouth every 6 (six) hours as needed.    Marland Kitchen albuterol (PROVENTIL HFA;VENTOLIN HFA) 108 (90 BASE) MCG/ACT inhaler Inhale 2 puffs into the lungs every 4 (four) hours as needed for wheezing or shortness of breath. 1 Inhaler 5  . albuterol (PROVENTIL) (2.5 MG/3ML) 0.083% nebulizer solution Take 3 mLs (2.5 mg total) by nebulization every 6 (six) hours as needed for wheezing or shortness of breath. 75 mL 3  . ALPRAZolam (XANAX) 0.25 MG tablet Take 1 tablet (0.25 mg total) by mouth 2 (two) times daily as needed for anxiety. 30 tablet 0  . amLODipine (NORVASC) 5 MG tablet TAKE 1 TABLET(5 MG) BY MOUTH DAILY 30 tablet 2  . benazepril (LOTENSIN) 10 MG tablet TAKE 1 TABLET(10 MG) BY MOUTH DAILY 30 tablet 2  . diphenhydrAMINE (BENADRYL) 25 MG tablet Take 25 mg by mouth at bedtime as needed for sleep.     Marland Kitchen docusate sodium (COLACE) 100 MG capsule Take 1 capsule (100 mg total) by mouth daily. 10 capsule 0  . Fluticasone-Salmeterol (ADVAIR) 250-50 MCG/DOSE AEPB Inhale 1 puff into the lungs 2 (two) times daily.    . furosemide (LASIX) 20 MG  tablet Take 1 tablet (20 mg total) by mouth 2 (two) times daily. (Patient taking differently: Take 20 mg by mouth 3 (three) times daily. ) 60 tablet 3  . levothyroxine (SYNTHROID, LEVOTHROID) 175 MCG tablet Take 1 tablet (175 mcg total) by mouth daily before breakfast. 30 tablet 2  . lovastatin (MEVACOR) 40 MG tablet TAKE 1 TABLET(40 MG) BY MOUTH AT BEDTIME 90 tablet 1  . methocarbamol (ROBAXIN) 500 MG tablet Take 1 tablet (500 mg total) by mouth every 6 (six) hours as needed for muscle spasms. 50 tablet 0  . montelukast (SINGULAIR) 10 MG tablet TAKE 1 TABLET BY MOUTH EVERY NIGHT AT BEDTIME (Patient taking differently: TAKE 1 TABLET BY MOUTH EVERY MORNING.) 90 tablet 1  . nystatin (MYCOSTATIN) 100000 UNIT/ML suspension Take 5 mLs (500,000 Units total) by mouth 4 (four) times daily. 240 mL 0  . omeprazole (PRILOSEC) 40 MG capsule TAKE 1 CAPSULE BY MOUTH EVERY DAY 90 capsule 1  . oxyCODONE (ROXICODONE) 15 MG immediate release tablet Take 15 mg by mouth every 4 (four) hours as needed for pain.    . polyethylene glycol (MIRALAX / GLYCOLAX) packet Take 17 g by mouth 2 (two) times daily. (Patient taking differently: Take 17 g by mouth 2 (two) times daily as needed. ) 14 each 0  . potassium chloride SA (K-DUR,KLOR-CON) 20 MEQ tablet Take 1 tablet (20 mEq total) by mouth daily. 30 tablet 1  . rivaroxaban (XARELTO) 10 MG TABS tablet Take 1 tablet (10 mg total) by mouth daily. 14 tablet 0  . tamsulosin (FLOMAX) 0.4 MG CAPS capsule TAKE 1 CAPSULE(0.4 MG) BY MOUTH AT BEDTIME 90 capsule 1   No current facility-administered medications on file prior to visit.    BP 121/82 mmHg  Pulse 88  Temp(Src) 98.3 F (36.8 C) (Oral)  Resp 18  Ht 5\' 5"  (1.651 m)  Wt 220 lb (99.791 kg)  BMI 36.61 kg/m2  SpO2 97%       Objective:   Physical Exam  Constitutional: He is oriented to person, place, and time. He appears well-developed and well-nourished. No distress.  HENT:  Head: Normocephalic and atraumatic.    Cardiovascular: Normal rate and regular rhythm.   No murmur heard. Pulmonary/Chest: Effort normal and breath sounds normal. No respiratory distress. He  has no wheezes. He has no rales.  Musculoskeletal:  1-2+ RLE swelling, 1+ LLE swelling. Resolution of facial swelling  Neurological: He is alert and oriented to person, place, and time.  Skin: Skin is warm and dry.  Psychiatric: He has a normal mood and affect. His behavior is normal. Thought content normal.          Assessment & Plan:

## 2015-09-09 NOTE — Patient Instructions (Addendum)
Decrease lasix to 20mg  once daily.  Complete lab work prior to leaving. Double check that you are taking synthroid 140mcg once daily. Follow up in 1 month.

## 2015-09-09 NOTE — Therapy (Addendum)
[facility] [address]  [place], [place], [address] Phone: [phone]   Fax:  [phone]  Physical Therapy Treatment  Patient Details  Name: Kyle Blair MRN: [id number] Date of Birth: Feb 08, 1961 Referring Provider:  Paralee Cancel, MD  Encounter Date: 09/09/2015      PT End of Session - 09/09/15 0938    Visit Number 10   Number of Visits 14   Date for PT Re-Evaluation 09/29/15   PT Start Time 0932   PT Stop Time 1020   PT Time Calculation (min) 48 min      Past Medical History  Diagnosis Date  . Asthma   . Polycystic kidney disease   . Hypothyroid   . GERD with stricture   . BPH (benign prostatic hypertrophy)   . Hyperlipidemia   . Lung nodule 02/03/2012  . History of stroke 2004  . Stroke   . Arthritis   . Hypertension     Past Surgical History  Procedure Laterality Date  . Hernia repair  2005    with mesh  . Nasal polyp excision  1992 or 93  . Carpal tunnel release    . Knee arthroscopy Bilateral 12/05/13    cyst and bone spur removed from left knee per pt.  . Colonoscopy    . Polypectomy    . Knee arthroscopy Left 01/2015  . Total knee arthroplasty Right 05/19/2015    Procedure: RIGHT TOTAL KNEE ARTHROPLASTY;  Surgeon: Paralee Cancel, MD;  Location: [facility];  Service: Orthopedics;  Laterality: Right;    There were no vitals filed for this visit.  Visit Diagnosis:  Right knee pain  Knee stiffness, right  Difficulty walking  Abnormality of gait      Subjective Assessment - 09/09/15 0937    Subjective states knee with minimal pain today rating 2/10 but did note increased soreness after last treatment.   Currently in Pain? Yes   Pain Score 2    Pain Location Knee   Pain Orientation Right           TODAY'S TREATMENT TherEx - NuStep lvl 5, 5' (increasing knee flexion throughout exercise) B FOB Tuck/Bridge combo for knee ROM 15x   Manual - R Knee Flexion and Extension mobes  grade 4, R Patellar mobes, contract/relax into knee flex and extension  TherEx -  Bridges 10x with increased knee flexion every 2 reps (increased flexion while in up position of bridge) Standing TKE ball into wall 20x3" Rocker Ankle DF and Knee Ext Stretch 3x20" TRX DL Squat with heel raise combo 15x with focus on full ROM DL Leg Press 25# 2x20 (focus on full ROM) B Knee Flexion Machine 20# 20x R Knee Extension Stretch on Knee Flexion Machine 20#, 2' (well tolerated) B Knee Extension Machine 15# 15x (B Concentric, R Eccentric; allowing machine to push into full flexion) TM Retro gait 2', 1.0 mph, 3.5% incline           G-code - Mobility, Walking around Current = 40% limitation per FOTO (CK); Goal was CK but increased to CJ due to good progress to date              PT Short Term Goals - 08/19/15 1651    PT SHORT TERM GOAL #1   Title R Knee PROM 5-100 by 09/01/15   Status On-going           PT Long Term Goals - 08/19/15 1652  PT LONG TERM GOAL #1   Title R Knee AROM 5-110 or better by 09/29/15   Status On-going   PT LONG TERM GOAL #2   Title pt able to ambulate without need for AD with good mechanics by 09/29/15   Status On-going   PT LONG TERM GOAL #3   Title pt reports R knee pain no greater than 2/10 and infrequent in nature by 09/29/15   Status On-going               Plan - 09/09/15 1110    Clinical Impression Statement good motivation and tolerance with treatments.  Prolonged extension stretch on knee flexion machine seemed quite beneficial today   PT Next Visit Plan R Knee ROM and Strenthening; Gait training; modalities PRN.   Consulted and Agree with Plan of Care Patient        Problem List Patient Active Problem List   Diagnosis Date Noted  . Nausea without vomiting 08/13/2015  . Volume overload 07/30/2015  . Indeterminate pulmonary nodules 07/23/2015  . Obese 05/20/2015  . S/P right TKA 05/19/2015  . S/P knee replacement 05/19/2015   . Preoperative evaluation to rule out surgical contraindication 04/15/2015  . Undiagnosed cardiac murmurs 04/15/2015  . OA (osteoarthritis) of knee 09/13/2013  . Hx of adenomatous colonic polyps 05/08/2013  . Barrett's esophagus 05/03/2013  . Chronic diarrhea 04/02/2013  . HTN (hypertension) 04/02/2013  . Hx of low back pain 09/26/2012  . Knee joint pain, right 05/02/2012  . Lung nodule 02/03/2012  . Asthma with acute exacerbation 12/13/2011  . BPH (benign prostatic hypertrophy) 12/13/2011  . Hyperlipidemia 12/13/2011  . GERD (gastroesophageal reflux disease) 12/13/2011  . Hypothyroid   . Polycystic kidney disease     Wynette Jersey PT, OCS 09/09/2015, 11:12 AM  [facility] [address]  [place], [place], [address] Phone: [phone]   Fax:  [phone]

## 2015-09-10 ENCOUNTER — Encounter: Payer: Self-pay | Admitting: Family

## 2015-09-10 ENCOUNTER — Other Ambulatory Visit: Payer: Self-pay | Admitting: Family

## 2015-09-10 NOTE — Assessment & Plan Note (Signed)
Lab Results  Component Value Date   TSH 562.491* 08/17/2015   Pt's last TSH was very high. I do not think he was taking synthroid.  I think that this may have contributed to his edema issues and that he may have had some myxedema features. He looks much better now and reports being back on synthroid.  Too soon to recheck tsh, continue synthroid.

## 2015-09-10 NOTE — Assessment & Plan Note (Signed)
Lungs are clear today.

## 2015-09-10 NOTE — Assessment & Plan Note (Signed)
Resolved.  Weight is down, decrease lasix 20mg  from bid to once daily.  Pt has upcoming apt with cardiology.

## 2015-09-14 ENCOUNTER — Ambulatory Visit: Payer: Medicare Other | Admitting: Physical Therapy

## 2015-09-14 ENCOUNTER — Telehealth: Payer: Self-pay | Admitting: *Deleted

## 2015-09-14 MED ORDER — BENAZEPRIL HCL 10 MG PO TABS: ORAL_TABLET | ORAL | Status: DC

## 2015-09-14 NOTE — Telephone Encounter (Signed)
Received fax from [facility] for benazepril refill.  Refills sent.

## 2015-09-16 ENCOUNTER — Ambulatory Visit: Payer: Medicare Other | Admitting: Cardiology

## 2015-09-16 ENCOUNTER — Ambulatory Visit: Payer: Medicare Other | Admitting: Physical Therapy

## 2015-09-16 DIAGNOSIS — M25661 Stiffness of right knee, not elsewhere classified: Secondary | ICD-10-CM

## 2015-09-16 DIAGNOSIS — R269 Unspecified abnormalities of gait and mobility: Secondary | ICD-10-CM

## 2015-09-16 DIAGNOSIS — R262 Difficulty in walking, not elsewhere classified: Secondary | ICD-10-CM

## 2015-09-16 DIAGNOSIS — M25561 Pain in right knee: Secondary | ICD-10-CM | POA: Diagnosis not present

## 2015-09-16 NOTE — Therapy (Addendum)
[facility] [address]  [place], [place], [address] Phone: [phone]   Fax:  [phone]  Physical Therapy Treatment  Patient Details  Name: Kyle Blair MRN: [id number] Date of Birth: May 27, 1961 Referring Provider:  Paralee Cancel, MD  Encounter Date: 09/16/2015      PT End of Session - 09/16/15 0935    Visit Number 11   Number of Visits 14   Date for PT Re-Evaluation 09/29/15   PT Start Time 0932   PT Stop Time 1021   PT Time Calculation (min) 49 min      Past Medical History  Diagnosis Date  . Asthma   . Polycystic kidney disease   . Hypothyroid   . GERD with stricture   . BPH (benign prostatic hypertrophy)   . Hyperlipidemia   . Lung nodule 02/03/2012  . History of stroke 2004  . Stroke   . Arthritis   . Hypertension     Past Surgical History  Procedure Laterality Date  . Hernia repair  2005    with mesh  . Nasal polyp excision  1992 or 93  . Carpal tunnel release    . Knee arthroscopy Bilateral 12/05/13    cyst and bone spur removed from left knee per pt.  . Colonoscopy    . Polypectomy    . Knee arthroscopy Left 01/2015  . Total knee arthroplasty Right 05/19/2015    Procedure: RIGHT TOTAL KNEE ARTHROPLASTY;  Surgeon: Paralee Cancel, MD;  Location: [facility];  Service: Orthopedics;  Laterality: Right;    There were no vitals filed for this visit.  Visit Diagnosis:  Right knee pain  Knee stiffness, right  Difficulty walking  Abnormality of gait      Subjective Assessment - 09/16/15 0935    Subjective states feels as though knee is improving stating he no longer uses SPC with walking; was ill earlier in the week but states still performed HEP.   Currently in Pain? Yes   Pain Score 3    Pain Location Knee   Pain Orientation Right            [facility] PT Assessment - 09/16/15 0001    Observation/Other Assessments   Focus on Therapeutic Outcomes (FOTO)  40% limitation   PROM   Overall PROM Comments R knee 9-90        TODAY'S TREATMENT TherEx - NuStep lvl 5, 5' (increasing knee flexion throughout exercise) B FOB Tuck/Bridge combo for knee ROM 15x Bridges 10x with increased knee flexion every 2 reps (increased flexion while in up position of bridge)  Manual - supine R Knee Flexion and Extension mobes grade 4, R Patellar mobes, contract/relax into knee flex and extension  TherEx -  DL Leg Press 25# 2x20 (focus on full ROM) B Knee Flexion Machine 25# 2x15 R Knee Extension Stretch on Knee Flexion Machine 25#, 2' (well tolerated) B Knee Extension Machine 15# 15x (B Concentric, R Eccentric; allowing machine to push into full flexion) TRX DL Squat with heel raise combo 17x with focus on full ROM                        PT Short Term Goals - 08/19/15 1651    PT SHORT TERM GOAL #1   Title R Knee PROM 5-100 by 09/01/15   Status On-going           PT Long Term Goals - 08/19/15 1652  PT LONG TERM GOAL #1   Title R Knee AROM 5-110 or better by 09/29/15   Status On-going   PT LONG TERM GOAL #2   Title pt able to ambulate without need for AD with good mechanics by 09/29/15   Status On-going   PT LONG TERM GOAL #3   Title pt reports R knee pain no greater than 2/10 and infrequent in nature by 09/29/15   Status On-going        G-code - Mobility, Walking around Current and Discharge = 40% limitation per FOTO (CK); Goal was initially CK but increased to CJ at 10th visit due to good progress but now pt self-discharged after 11th visit.        Plan - 09/16/15 1011    Clinical Impression Statement pt is frustrated with slow progress but he works very hard while in clinic and does seem to make small gains every week with regard to ROM but still quite stiff.  He does require quite a bit of cueing with exercises especially when performing new ones. Not much pain at this point other than with flexion stretching.   PT Next Visit Plan R Knee ROM and  Strenthening; Gait training; modalities PRN.   Consulted and Agree with Plan of Care Patient        Problem List Patient Active Problem List   Diagnosis Date Noted  . Volume overload 07/30/2015  . Indeterminate pulmonary nodules 07/23/2015  . Obese 05/20/2015  . S/P right TKA 05/19/2015  . S/P knee replacement 05/19/2015  . Preoperative evaluation to rule out surgical contraindication 04/15/2015  . Undiagnosed cardiac murmurs 04/15/2015  . OA (osteoarthritis) of knee 09/13/2013  . Hx of adenomatous colonic polyps 05/08/2013  . Barrett's esophagus 05/03/2013  . Chronic diarrhea 04/02/2013  . HTN (hypertension) 04/02/2013  . Hx of low back pain 09/26/2012  . Knee joint pain, right 05/02/2012  . Lung nodule 02/03/2012  . Asthma with acute exacerbation 12/13/2011  . BPH (benign prostatic hypertrophy) 12/13/2011  . Hyperlipidemia 12/13/2011  . GERD (gastroesophageal reflux disease) 12/13/2011  . Hypothyroid   . Polycystic kidney disease     Kyle Blair,Kyle Blair PT, OCS 09/16/2015, 10:25 AM  [facility] [address]  [place], [place], [address] Phone: [phone]   Fax:  [phone]   PHYSICAL THERAPY DISCHARGE SUMMARY  Visits from Start of Care: 11  Current functional level related to goals / functional outcomes: Some progress but goals not met   Remaining deficits: R Knee PROM 9-90; impaired gait mechanics   Education / Equipment: HEP Plan: Patient agrees to discharge.  Patient goals were not met. Patient is being discharged due to the patient's request.  ?????        Kyle Blair cancelled his 2 appointments following 09/16/15 appointment and then stated he didn't want to return.  Pt still with poor knee ROM and impaired gait mechanics but we will discharge from our care at this time per pt request.  Kyle Blair PT, OCS 09/30/2015 9:39 AM

## 2015-09-21 ENCOUNTER — Ambulatory Visit: Payer: Medicare Other | Admitting: Rehabilitation

## 2015-09-21 ENCOUNTER — Ambulatory Visit: Payer: Medicare Other | Admitting: Cardiology

## 2015-09-23 ENCOUNTER — Ambulatory Visit: Payer: Medicare Other | Admitting: Physical Therapy

## 2015-10-09 ENCOUNTER — Ambulatory Visit (INDEPENDENT_AMBULATORY_CARE_PROVIDER_SITE_OTHER): Payer: Medicare Other | Admitting: Family

## 2015-10-09 ENCOUNTER — Encounter: Payer: Self-pay | Admitting: Family

## 2015-10-09 VITALS — BP 122/80 | HR 83 | Temp 98.0°F | Resp 16 | Ht 65.0 in | Wt 210.0 lb

## 2015-10-09 DIAGNOSIS — J4541 Moderate persistent asthma with (acute) exacerbation: Secondary | ICD-10-CM

## 2015-10-09 DIAGNOSIS — E039 Hypothyroidism, unspecified: Secondary | ICD-10-CM | POA: Diagnosis not present

## 2015-10-09 LAB — TSH: TSH: 1.89 u[IU]/mL (ref 0.35–4.50)

## 2015-10-09 NOTE — Progress Notes (Signed)
Pre visit review using our clinic review tool, if applicable. No additional management support is needed unless otherwise documented below in the visit note. 

## 2015-10-09 NOTE — Patient Instructions (Signed)
Please complete lab work prior to leaving. Continue synthroid. Please schedule a follow up appointment in 3 months.Kyle Blair

## 2015-10-09 NOTE — Assessment & Plan Note (Signed)
Stable on current meds, continue same.  

## 2015-10-09 NOTE — Progress Notes (Signed)
Subjective:    Patient ID: Kyle Blair, male    DOB: [birth year], 54 y.o.   MRN: [id number]  HPI  Mr. Kyle Blair is a 54 yr old male who presents today for follow up.  Hypothyroid- Last visit his TSH was noted to be extremely elevated.  He had accidentally stopped taking synthroid for some time.  Lab Results  Component Value Date   TSH 562.491* 08/17/2015   Wt Readings from Last 3 Encounters:  10/09/15 210 lb (95.255 kg)  09/09/15 220 lb (99.791 kg)  09/02/15 228 lb (103.42 kg)   Asthma- reports that this has been stable. Denies CP/SOB. Energy is improved.    Review of Systems See HPI  Past Medical History  Diagnosis Date  . Asthma   . Polycystic kidney disease   . Hypothyroid   . GERD with stricture   . BPH (benign prostatic hypertrophy)   . Hyperlipidemia   . Lung nodule 02/03/2012  . History of stroke 2004  . Stroke ([place])   . Arthritis   . Hypertension     Social History   Social History  . Marital Status: Married    Spouse Name: N/A  . Number of Children: 2  . Years of Education: N/A   Occupational History  . DISABLED    Social History Main Topics  . Smoking status: Former Smoker -- 1.00 packs/day for 20 years    Types: Cigarettes    Quit date: 03/15/2015  . Smokeless tobacco: Never Used  . Alcohol Use: 0.0 oz/week    0 Standard drinks or equivalent per week     Comment: rarely  . Drug Use: No  . Sexual Activity: Not on file   Other Topics Concern  . Not on file   Social History Narrative   Caffeine use:  1 pot coffee daily, 1 glass tea   Regular exercise:  No. Has active lifestyle.   2 biological and 1 stepson.   Former smoker- quit in 2007   He is on disability- reports that he has a history of heat stroke.               Past Surgical History  Procedure Laterality Date  . Hernia repair  2005    with mesh  . Nasal polyp excision  1992 or 93  . Carpal tunnel release    . Knee arthroscopy Bilateral 12/05/13    cyst and bone spur  removed from left knee per pt.  . Colonoscopy    . Polypectomy    . Knee arthroscopy Left 01/2015  . 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 Heart disease Mother     pacemaker  . Diabetes Father   . Kidney disease Father   . Diabetes Sister   . Kidney disease Sister   . Colon cancer Neg Hx   . Esophageal cancer Neg Hx   . Rectal cancer Neg Hx   . Stomach cancer Neg Hx   . Prostate cancer Neg Hx   . Pancreatic cancer Neg Hx     Allergies  Allergen Reactions  . Aspirin Anaphylaxis  . Doxycycline Hyclate Swelling    Facial Swelling  . Ibuprofen Anaphylaxis  . Peanut Butter Flavor Anaphylaxis  . Cephalexin Rash    Current Outpatient Prescriptions on File Prior to  Visit  Medication Sig Dispense Refill  . acetaminophen (TYLENOL) 325 MG tablet Take 1-2 tablets (325-650 mg total) by mouth every 6 (six) hours as needed.    Marland Kitchen albuterol (PROVENTIL HFA;VENTOLIN HFA) 108 (90 BASE) MCG/ACT inhaler Inhale 2 puffs into the lungs every 4 (four) hours as needed for wheezing or shortness of breath. 1 Inhaler 5  . albuterol (PROVENTIL) (2.5 MG/3ML) 0.083% nebulizer solution Take 3 mLs (2.5 mg total) by nebulization every 6 (six) hours as needed for wheezing or shortness of breath. 75 mL 3  . ALPRAZolam (XANAX) 0.25 MG tablet Take 1 tablet (0.25 mg total) by mouth 2 (two) times daily as needed for anxiety. 30 tablet 0  . amLODipine (NORVASC) 5 MG tablet TAKE 1 TABLET(5 MG) BY MOUTH DAILY 30 tablet 5  . benazepril (LOTENSIN) 10 MG tablet TAKE 1 TABLET(10 MG) BY MOUTH DAILY 30 tablet 5  . diphenhydrAMINE (BENADRYL) 25 MG tablet Take 25 mg by mouth at bedtime as needed for sleep.     Marland Kitchen docusate sodium (COLACE) 100 MG capsule Take 1 capsule (100 mg total) by mouth daily. 10 capsule 0  . Fluticasone-Salmeterol  (ADVAIR) 250-50 MCG/DOSE AEPB Inhale 1 puff into the lungs 2 (two) times daily.    . furosemide (LASIX) 20 MG tablet Take 1 tablet (20 mg total) by mouth 2 (two) times daily. (Patient taking differently: Take 20 mg by mouth every other day. ) 60 tablet 3  . levothyroxine (SYNTHROID, LEVOTHROID) 175 MCG tablet Take 1 tablet (175 mcg total) by mouth daily before breakfast. 30 tablet 2  . lovastatin (MEVACOR) 40 MG tablet TAKE 1 TABLET(40 MG) BY MOUTH AT BEDTIME 90 tablet 1  . methocarbamol (ROBAXIN) 500 MG tablet Take 1 tablet (500 mg total) by mouth every 6 (six) hours as needed for muscle spasms. 50 tablet 0  . montelukast (SINGULAIR) 10 MG tablet TAKE 1 TABLET BY MOUTH EVERY NIGHT AT BEDTIME (Patient taking differently: TAKE 1 TABLET BY MOUTH EVERY MORNING.) 90 tablet 1  . nystatin (MYCOSTATIN) 100000 UNIT/ML suspension Take 5 mLs (500,000 Units total) by mouth 4 (four) times daily. 240 mL 0  . omeprazole (PRILOSEC) 40 MG capsule TAKE 1 CAPSULE BY MOUTH EVERY DAY 90 capsule 1  . oxyCODONE (ROXICODONE) 15 MG immediate release tablet Take 15 mg by mouth every 4 (four) hours as needed for pain.    . polyethylene glycol (MIRALAX / GLYCOLAX) packet Take 17 g by mouth 2 (two) times daily. (Patient taking differently: Take 17 g by mouth 2 (two) times daily as needed. ) 14 each 0  . rivaroxaban (XARELTO) 10 MG TABS tablet Take 1 tablet (10 mg total) by mouth daily. 14 tablet 0  . tamsulosin (FLOMAX) 0.4 MG CAPS capsule TAKE 1 CAPSULE(0.4 MG) BY MOUTH AT BEDTIME 90 capsule 1   No current facility-administered medications on file prior to visit.    BP 122/80 mmHg  Pulse 83  Temp(Src) 98 F (36.7 C) (Oral)  Resp 16  Ht 5\' 5"  (1.651 m)  Wt 210 lb (95.255 kg)  BMI 34.95 kg/m2  SpO2 97%       Objective:   Physical Exam  Constitutional: He is oriented to person, place, and time. He appears well-developed and well-nourished. No distress.  HENT:  Head: Normocephalic and atraumatic.  Cardiovascular:  Normal rate and regular rhythm.   No murmur heard. Pulmonary/Chest: Effort normal and breath sounds normal. No respiratory distress. He has no wheezes. He has no rales.  Musculoskeletal:  Trace  RLE/R knee swelling  Lymphadenopathy:    He has no cervical adenopathy.  Neurological: He is alert and oriented to person, place, and time.  Skin: Skin is warm and dry.  Psychiatric: He has a normal mood and affect. His behavior is normal. Thought content normal.          Assessment & Plan:

## 2015-10-09 NOTE — Assessment & Plan Note (Signed)
Clinically improved back on synthroid. Obtain TSH.

## 2015-10-10 ENCOUNTER — Encounter: Payer: Self-pay | Admitting: Family

## 2015-10-12 ENCOUNTER — Telehealth: Payer: Self-pay | Admitting: *Deleted

## 2015-10-12 MED ORDER — MONTELUKAST SODIUM 10 MG PO TABS: 10.0000 mg | ORAL_TABLET | Freq: Every morning | ORAL | Status: DC

## 2015-10-12 NOTE — Telephone Encounter (Signed)
Received request from [facility] for montelukast 10mg  at bedtime.  REfills sent.

## 2015-11-09 ENCOUNTER — Telehealth: Payer: Self-pay | Admitting: Family

## 2015-11-09 MED ORDER — VARENICLINE TARTRATE 0.5 MG X 11 & 1 MG X 42 PO MISC: ORAL | Status: DC

## 2015-11-09 MED ORDER — LEVOTHYROXINE SODIUM 175 MCG PO TABS: 175.0000 ug | ORAL_TABLET | Freq: Every day | ORAL | Status: DC

## 2015-11-09 NOTE — Addendum Note (Signed)
Addended by: Kelle Darting A on: 11/09/2015 02:33 PM   Modules accepted: Orders

## 2015-11-09 NOTE — Telephone Encounter (Signed)
rx sent for chantix.  Advise pt to stop smoking after 1 week on chantix, call if depression symptoms occur while on chantix. Go to ER if thoughts of hurting self or others. contact us prior to completion of the starter month pack for an rx for the continuation month pack.

## 2015-11-09 NOTE — Telephone Encounter (Signed)
Caller name: Shale  Relationship to patient: Self  Can be reached: [phone]  Pharmacy: [facility], [place] - [address]  Reason for call: Pt says that he need a refill on his LEVOTHROID Rx and he also started back smoking so he need a Rx for Chantix. He says that he would like to speak with CMA directly before sending in script if possible.    Please advise pt.    Thanks.

## 2015-11-09 NOTE — Addendum Note (Signed)
Addended by: [name] on: 11/09/2015 06:06 PM   Modules accepted: Orders

## 2015-11-09 NOTE — Telephone Encounter (Signed)
Levothyroxine refill sent. Please advise re: smoking cessation?

## 2015-11-09 NOTE — Telephone Encounter (Signed)
Notified pt and he voices understanding. Rx faxed to pharmacy.

## 2015-11-10 ENCOUNTER — Other Ambulatory Visit: Payer: Self-pay | Admitting: Family

## 2016-01-11 ENCOUNTER — Telehealth: Payer: Self-pay | Admitting: Family

## 2016-01-11 ENCOUNTER — Ambulatory Visit (INDEPENDENT_AMBULATORY_CARE_PROVIDER_SITE_OTHER): Payer: Medicare Other | Admitting: Family

## 2016-01-11 ENCOUNTER — Encounter: Payer: Self-pay | Admitting: Family

## 2016-01-11 VITALS — BP 137/91 | HR 89 | Temp 98.6°F | Resp 16 | Ht 65.0 in | Wt 218.6 lb

## 2016-01-11 DIAGNOSIS — R911 Solitary pulmonary nodule: Secondary | ICD-10-CM

## 2016-01-11 DIAGNOSIS — I10 Essential (primary) hypertension: Secondary | ICD-10-CM | POA: Diagnosis not present

## 2016-01-11 DIAGNOSIS — Z96651 Presence of right artificial knee joint: Secondary | ICD-10-CM | POA: Diagnosis not present

## 2016-01-11 DIAGNOSIS — M1711 Unilateral primary osteoarthritis, right knee: Secondary | ICD-10-CM

## 2016-01-11 DIAGNOSIS — E785 Hyperlipidemia, unspecified: Secondary | ICD-10-CM | POA: Diagnosis not present

## 2016-01-11 DIAGNOSIS — E039 Hypothyroidism, unspecified: Secondary | ICD-10-CM

## 2016-01-11 LAB — BASIC METABOLIC PANEL
BUN: 13 mg/dL (ref 6–23)
CHLORIDE: 102 meq/L (ref 96–112)
CO2: 26 mEq/L (ref 19–32)
Calcium: 9.5 mg/dL (ref 8.4–10.5)
Creatinine, Ser: 0.76 mg/dL (ref 0.40–1.50)
GFR: 113.58 mL/min (ref >60.00)
GLUCOSE: 112 mg/dL — AB (ref 70–99)
Potassium: 3.8 mEq/L (ref 3.5–5.1)
Sodium: 138 mEq/L (ref 135–145)

## 2016-01-11 LAB — LIPID PANEL
CHOL/HDL RATIO: 3
Cholesterol: 141 mg/dL (ref 0–200)
HDL: 40.3 mg/dL (ref >39.00)
LDL CALC: 68 mg/dL (ref 0–99)
NONHDL: 100.64
Triglycerides: 164 mg/dL — ABNORMAL HIGH (ref 0.0–149.0)
VLDL: 32.8 mg/dL (ref 0.0–40.0)

## 2016-01-11 MED ORDER — MONTELUKAST SODIUM 10 MG PO TABS: 10.0000 mg | ORAL_TABLET | Freq: Every morning | ORAL | Status: DC

## 2016-01-11 MED ORDER — FLUTICASONE-SALMETEROL 250-50 MCG/DOSE IN AEPB: 1.0000 | INHALATION_SPRAY | Freq: Two times a day (BID) | RESPIRATORY_TRACT | Status: DC

## 2016-01-11 MED ORDER — LOVASTATIN 40 MG PO TABS
ORAL_TABLET | ORAL | Status: DC
Start: 2016-01-11 — End: 2016-04-12

## 2016-01-11 MED ORDER — OMEPRAZOLE 40 MG PO CPDR: DELAYED_RELEASE_CAPSULE | ORAL | Status: DC

## 2016-01-11 NOTE — Assessment & Plan Note (Addendum)
Fair BP on benazepril, continue same. Obtain bmet.

## 2016-01-11 NOTE — Telephone Encounter (Signed)
See mychart.  

## 2016-01-11 NOTE — Patient Instructions (Signed)
Please complete lab work prior to leaving.   

## 2016-01-11 NOTE — Progress Notes (Signed)
Pre visit review using our clinic review tool, if applicable. No additional management support is needed unless otherwise documented below in the visit note. 

## 2016-01-11 NOTE — Assessment & Plan Note (Signed)
Tolerating lovastatin, continue same, obtain lipid panel.

## 2016-01-11 NOTE — Assessment & Plan Note (Signed)
Decreased mobility of right knee.  Pt desires referral to a different ortho. Will arrange.

## 2016-01-11 NOTE — Progress Notes (Signed)
Subjective:    Patient ID: Kyle Blair, male    DOB: 1961-12-19, 55 y.o.   MRN: [id number]  HPI   Kyle Blair is a 55 yr old male who presents today for follow up.  HTN-  On benazepril.   BP Readings from Last 3 Encounters:  01/11/16 137/91  10/09/15 122/80  09/09/15 121/82   Hypothyroid- maintained on synthroid.   Lab Results  Component Value Date   TSH 1.89 10/09/2015   Hyperlipidemia- lovastatin.  Denies myalgia.  Lab Results  Component Value Date   CHOL 183 02/05/2015   HDL 57.40 02/05/2015   LDLCALC 105* 02/05/2015   TRIG 103.0 02/05/2015   CHOLHDL 3 02/05/2015   Knee pain-  Pt is s/p R TKA. Has seen Dr. Alvan Dame in the past.  Would like referral to [facility]    Review of Systems See HPI  Past Medical History  Diagnosis Date  . Asthma   . Polycystic kidney disease   . Hypothyroid   . GERD with stricture   . BPH (benign prostatic hypertrophy)   . Hyperlipidemia   . Lung nodule 02/03/2012  . History of stroke 2004  . Stroke ([place])   . Arthritis   . Hypertension     Social History   Social History  . Marital Status: Married    Spouse Name: N/A  . Number of Children: 2  . Years of Education: N/A   Occupational History  . DISABLED    Social History Main Topics  . Smoking status: Former Smoker -- 1.00 packs/day for 20 years    Types: Cigarettes    Quit date: 03/15/2015  . Smokeless tobacco: Never Used  . Alcohol Use: 0.0 oz/week    0 Standard drinks or equivalent per week     Comment: rarely  . Drug Use: No  . Sexual Activity: Not on file   Other Topics Concern  . Not on file   Social History Narrative   Caffeine use:  1 pot coffee daily, 1 glass tea   Regular exercise:  No. Has active lifestyle.   2 biological and 1 stepson.   Former smoker- quit in 2007   He is on disability- reports that he has a history of heat stroke.               Past Surgical History  Procedure Laterality Date  . Hernia repair  2005    with mesh  .  Nasal polyp excision  1992 or 93  . Carpal tunnel release    . Knee arthroscopy Bilateral 12/05/13    cyst and bone spur removed from left knee per pt.  . Colonoscopy    . Polypectomy    . Knee arthroscopy Left 01/2015  . 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 Heart disease Mother     pacemaker  . Diabetes Father   . Kidney disease Father   . Diabetes Sister   . Kidney disease Sister   . Colon cancer Neg Hx   . Esophageal cancer Neg Hx   . Rectal cancer Neg Hx   . Stomach cancer Neg Hx   . Prostate cancer Neg Hx   . Pancreatic cancer Neg Hx     Allergies  Allergen Reactions  . Aspirin Anaphylaxis  . Doxycycline  Hyclate Swelling    Facial Swelling  . Ibuprofen Anaphylaxis  . Peanut Butter Flavor Anaphylaxis  . Cephalexin Rash    Current Outpatient Prescriptions on File Prior to Visit  Medication Sig Dispense Refill  . acetaminophen (TYLENOL) 325 MG tablet Take 1-2 tablets (325-650 mg total) by mouth every 6 (six) hours as needed.    Marland Kitchen albuterol (PROVENTIL HFA;VENTOLIN HFA) 108 (90 BASE) MCG/ACT inhaler Inhale 2 puffs into the lungs every 4 (four) hours as needed for wheezing or shortness of breath. 1 Inhaler 5  . albuterol (PROVENTIL) (2.5 MG/3ML) 0.083% nebulizer solution Take 3 mLs (2.5 mg total) by nebulization every 6 (six) hours as needed for wheezing or shortness of breath. 75 mL 3  . amLODipine (NORVASC) 5 MG tablet TAKE 1 TABLET(5 MG) BY MOUTH DAILY 30 tablet 5  . benazepril (LOTENSIN) 10 MG tablet TAKE 1 TABLET(10 MG) BY MOUTH DAILY 30 tablet 5  . diphenhydrAMINE (BENADRYL) 25 MG tablet Take 25 mg by mouth at bedtime as needed for sleep.     Marland Kitchen docusate sodium (COLACE) 100 MG capsule Take 1 capsule (100 mg total) by mouth daily. 10 capsule 0  . furosemide (LASIX) 20 MG tablet Take  1 tablet (20 mg total) by mouth 2 (two) times daily. (Patient taking differently: Take 20 mg by mouth every other day. ) 60 tablet 3  . gabapentin (NEURONTIN) 300 MG capsule Take 300 mg by mouth 2 (two) times daily.  0  . levothyroxine (SYNTHROID, LEVOTHROID) 175 MCG tablet Take 1 tablet (175 mcg total) by mouth daily before breakfast. 90 tablet 0  . methocarbamol (ROBAXIN) 500 MG tablet Take 1 tablet (500 mg total) by mouth every 6 (six) hours as needed for muscle spasms. 50 tablet 0  . nystatin (MYCOSTATIN) 100000 UNIT/ML suspension Take 5 mLs (500,000 Units total) by mouth 4 (four) times daily. 240 mL 0  . oxyCODONE (ROXICODONE) 15 MG immediate release tablet Take 15 mg by mouth every 4 (four) hours as needed for pain.    . polyethylene glycol (MIRALAX / GLYCOLAX) packet Take 17 g by mouth 2 (two) times daily. (Patient taking differently: Take 17 g by mouth 2 (two) times daily as needed. ) 14 each 0  . rivaroxaban (XARELTO) 10 MG TABS tablet Take 1 tablet (10 mg total) by mouth daily. 14 tablet 0  . tamsulosin (FLOMAX) 0.4 MG CAPS capsule TAKE 1 CAPSULE(0.4 MG) BY MOUTH AT BEDTIME 90 capsule 1   No current facility-administered medications on file prior to visit.    BP 137/91 mmHg  Pulse 89  Temp(Src) 98.6 F (37 C) (Oral)  Resp 16  Ht 5\' 5"  (1.651 m)  Wt 218 lb 9.6 oz (99.156 kg)  BMI 36.38 kg/m2  SpO2 95%       Objective:   Physical Exam  Constitutional: He is oriented to person, place, and time. He appears well-developed and well-nourished. No distress.  HENT:  Head: Normocephalic and atraumatic.  Cardiovascular: Normal rate and regular rhythm.   No murmur heard. Pulmonary/Chest: Effort normal and breath sounds normal. No respiratory distress. He has no wheezes. He has no rales.  Musculoskeletal: He exhibits no edema.  Neurological: He is alert and oriented to person, place, and time.  Skin: Skin is warm and dry.  Psychiatric: He has a normal mood and affect. His behavior  is normal. Thought content normal.          Assessment & Plan:

## 2016-01-11 NOTE — Assessment & Plan Note (Signed)
Stable on synthroid, continue same.   ?

## 2016-01-25 NOTE — Telephone Encounter (Signed)
Please contact pt re: unread mychart.

## 2016-01-26 ENCOUNTER — Other Ambulatory Visit: Payer: Self-pay | Admitting: Family

## 2016-01-26 NOTE — Telephone Encounter (Signed)
Mailed letter to pt

## 2016-02-03 ENCOUNTER — Telehealth: Payer: Self-pay | Admitting: *Deleted

## 2016-02-03 ENCOUNTER — Other Ambulatory Visit: Payer: Self-pay | Admitting: Family

## 2016-02-03 DIAGNOSIS — M1710 Unilateral primary osteoarthritis, unspecified knee: Secondary | ICD-10-CM

## 2016-02-03 DIAGNOSIS — M179 Osteoarthritis of knee, unspecified: Secondary | ICD-10-CM

## 2016-02-03 NOTE — Telephone Encounter (Signed)
Pt states he is changing meds over to [facility] and not requesting refill through [facility]. Denial sent to [facility] pharmacy.

## 2016-02-03 NOTE — Telephone Encounter (Signed)
Stanton Kidney or Delsa Sale-- can you help with ortho referral from January and let pt know the outcome?  Please see below.  Received call from pt that he was not able to keep initial appt with [facility] in January due to spouse being in the hospital. He called them today to r/s and they told him to go back to his  previous ortho (Dr Durward Fortes). Pt is requesting that we call [facility] to get another consultation for 2nd opinion per referral from January.

## 2016-02-11 NOTE — Telephone Encounter (Signed)
Sharyn Lull called to f/u on referral to [facility] for 2nd opinion on knee. They weren't able to go in January due to Sharyn Lull being in the hospital. Please call her at [phone].

## 2016-02-12 NOTE — Telephone Encounter (Signed)
Contacted [facility] again, they are still awaiting approval from drs there. I called patients wife and left detail message explaining what I was told by [facility]. Detailed message ok per [facility]

## 2016-02-15 NOTE — Telephone Encounter (Signed)
Pt in office today with spouse. Upset that 2nd opinion is taking so long to be contacted for appt. Please advise.

## 2016-02-15 NOTE — Telephone Encounter (Signed)
Spoke with patient. Will initiate referral with another ortho group.

## 2016-04-05 ENCOUNTER — Other Ambulatory Visit: Payer: Self-pay | Admitting: Family

## 2016-04-12 ENCOUNTER — Encounter: Payer: Self-pay | Admitting: Family

## 2016-04-12 ENCOUNTER — Ambulatory Visit (HOSPITAL_BASED_OUTPATIENT_CLINIC_OR_DEPARTMENT_OTHER)
Admission: RE | Admit: 2016-04-12 | Discharge: 2016-04-12 | Disposition: A | Discharge status: Home / Self Care | Payer: Medicare Other | Source: Ambulatory Visit | Attending: Family | Admitting: Family

## 2016-04-12 ENCOUNTER — Ambulatory Visit (INDEPENDENT_AMBULATORY_CARE_PROVIDER_SITE_OTHER): Payer: Medicare Other | Admitting: Family

## 2016-04-12 VITALS — BP 112/73 | HR 71 | Temp 97.9°F | Resp 18 | Ht 65.0 in | Wt 214.8 lb

## 2016-04-12 DIAGNOSIS — J9811 Atelectasis: Secondary | ICD-10-CM | POA: Diagnosis not present

## 2016-04-12 DIAGNOSIS — I10 Essential (primary) hypertension: Secondary | ICD-10-CM

## 2016-04-12 DIAGNOSIS — R0989 Other specified symptoms and signs involving the circulatory and respiratory systems: Secondary | ICD-10-CM

## 2016-04-12 DIAGNOSIS — E039 Hypothyroidism, unspecified: Secondary | ICD-10-CM

## 2016-04-12 DIAGNOSIS — M25561 Pain in right knee: Secondary | ICD-10-CM | POA: Diagnosis not present

## 2016-04-12 LAB — TSH: TSH: 4.24 u[IU]/mL (ref 0.35–4.50)

## 2016-04-12 MED ORDER — AMLODIPINE BESYLATE 5 MG PO TABS: ORAL_TABLET | ORAL | Status: DC

## 2016-04-12 MED ORDER — FUROSEMIDE 20 MG PO TABS: 20.0000 mg | ORAL_TABLET | Freq: Two times a day (BID) | ORAL | Status: DC

## 2016-04-12 NOTE — Patient Instructions (Signed)
Complete lab work prior to leaving. Complete chest x ray on the first floor. Follow up in 3 months.

## 2016-04-12 NOTE — Progress Notes (Signed)
Pre visit review using our clinic review tool, if applicable. No additional management support is needed unless otherwise documented below in the visit note. 

## 2016-04-12 NOTE — Progress Notes (Signed)
Subjective:    Patient ID: Kyle Blair, male    DOB: 14-Jan-1961, 55 y.o.   MRN: [id number]  HPI  Kyle Blair is a 56 yr old male who presents today for follow up.  HTN- current medications include amlodipine 5mg , benazepril 10mg . BP Readings from Last 3 Encounters:  04/12/16 112/73  01/11/16 137/91  10/09/15 122/80   Hypothyroid- feels good on current dose of synthroid.  Reports good compliance with medication. Lab Results  Component Value Date   TSH 1.89 10/09/2015   R knee pain/swelling- he is working on obtaining records from his previous ortho in order to schedule appointment with new ortho for second opinion.    Review of Systems  Respiratory: Negative for shortness of breath.   Cardiovascular: Negative for chest pain and leg swelling.      see HPI  Past Medical History  Diagnosis Date  . Asthma   . Polycystic kidney disease   . Hypothyroid   . GERD with stricture   . BPH (benign prostatic hypertrophy)   . Hyperlipidemia   . Lung nodule 02/03/2012  . History of stroke 2004  . Stroke ([place])   . Arthritis   . Hypertension      Social History   Social History  . Marital Status: Married    Spouse Name: N/A  . Number of Children: 2  . Years of Education: N/A   Occupational History  . DISABLED    Social History Main Topics  . Smoking status: Former Smoker -- 1.00 packs/day for 20 years    Types: Cigarettes    Quit date: 03/15/2015  . Smokeless tobacco: Never Used  . Alcohol Use: 0.0 oz/week    0 Standard drinks or equivalent per week     Comment: rarely  . Drug Use: No  . Sexual Activity: Not on file   Other Topics Concern  . Not on file   Social History Narrative   Caffeine use:  1 pot coffee daily, 1 glass tea   Regular exercise:  No. Has active lifestyle.   2 biological and 1 stepson.   Former smoker- quit in 2007   He is on disability- reports that he has a history of heat stroke.               Past Surgical History  Procedure  Laterality Date  . Hernia repair  2005    with mesh  . Nasal polyp excision  1992 or 93  . Carpal tunnel release    . Knee arthroscopy Bilateral 12/05/13    cyst and bone spur removed from left knee per pt.  . Colonoscopy    . Polypectomy    . Knee arthroscopy Left 01/2015  . 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 Heart disease Mother     pacemaker  . Diabetes Father   . Kidney disease Father   . Diabetes Sister   . Kidney disease Sister   . Colon cancer Neg Hx   . Esophageal cancer Neg Hx   . Rectal cancer Neg Hx   . Stomach cancer Neg Hx   . Prostate cancer Neg Hx   . Pancreatic cancer Neg Hx     Allergies  Allergen Reactions  . Aspirin Anaphylaxis  . Doxycycline Hyclate Swelling  Facial Swelling  . Ibuprofen Anaphylaxis  . Peanut Butter Flavor Anaphylaxis  . Cephalexin Rash    Current Outpatient Prescriptions on File Prior to Visit  Medication Sig Dispense Refill  . acetaminophen (TYLENOL) 325 MG tablet Take 1-2 tablets (325-650 mg total) by mouth every 6 (six) hours as needed.    Marland Kitchen albuterol (PROAIR HFA) 108 (90 Base) MCG/ACT inhaler Inhale 2 puffs into the lungs every 4 (four) hours as needed for wheezing or shortness of breath. 8.5 g 0  . amLODipine (NORVASC) 5 MG tablet TAKE 1 TABLET(5 MG) BY MOUTH DAILY 30 tablet 5  . benazepril (LOTENSIN) 10 MG tablet TAKE 1 TABLET(10 MG) BY MOUTH DAILY 30 tablet 5  . diphenhydrAMINE (BENADRYL) 25 MG tablet Take 25 mg by mouth at bedtime as needed for sleep.     Marland Kitchen docusate sodium (COLACE) 100 MG capsule Take 1 capsule (100 mg total) by mouth daily. 10 capsule 0  . Fluticasone-Salmeterol (ADVAIR) 250-50 MCG/DOSE AEPB Inhale 1 puff into the lungs 2 (two) times daily. 60 each 5  . furosemide (LASIX) 20 MG tablet Take 1 tablet (20 mg total) by  mouth 2 (two) times daily. (Patient taking differently: Take 20 mg by mouth every other day. ) 60 tablet 3  . gabapentin (NEURONTIN) 300 MG capsule Take 300 mg by mouth 2 (two) times daily.  0  . levothyroxine (SYNTHROID, LEVOTHROID) 175 MCG tablet Take 1 tablet (175 mcg total) by mouth daily before breakfast. 90 tablet 0  . lovastatin (MEVACOR) 40 MG tablet TAKE 1 TABLET(40 MG) BY MOUTH AT BEDTIME 90 tablet 0  . methocarbamol (ROBAXIN) 500 MG tablet Take 1 tablet (500 mg total) by mouth every 6 (six) hours as needed for muscle spasms. 50 tablet 0  . montelukast (SINGULAIR) 10 MG tablet Take 1 tablet (10 mg total) by mouth every morning. 90 tablet 1  . nystatin (MYCOSTATIN) 100000 UNIT/ML suspension Take 5 mLs (500,000 Units total) by mouth 4 (four) times daily. 240 mL 0  . omeprazole (PRILOSEC) 40 MG capsule TAKE 1 CAPSULE BY MOUTH EVERY DAY 90 capsule 1  . oxyCODONE (ROXICODONE) 15 MG immediate release tablet Take 15 mg by mouth every 4 (four) hours as needed for pain.    . polyethylene glycol (MIRALAX / GLYCOLAX) packet Take 17 g by mouth 2 (two) times daily. (Patient taking differently: Take 17 g by mouth 2 (two) times daily as needed. ) 14 each 0  . rivaroxaban (XARELTO) 10 MG TABS tablet Take 1 tablet (10 mg total) by mouth daily. 14 tablet 0  . tamsulosin (FLOMAX) 0.4 MG CAPS capsule TAKE 1 CAPSULE(0.4 MG) BY MOUTH AT BEDTIME 90 capsule 0   No current facility-administered medications on file prior to visit.    BP 112/73 mmHg  Pulse 71  Temp(Src) 97.9 F (36.6 C) (Oral)  Resp 18  Ht 5\' 5"  (1.651 m)  Wt 214 lb 12.8 oz (97.433 kg)  BMI 35.74 kg/m2  SpO2 97%    Objective:   Physical Exam  Constitutional: He is oriented to person, place, and time. He appears well-developed and well-nourished. No distress.  HENT:  Head: Normocephalic and atraumatic.  Cardiovascular: Normal rate and regular rhythm.   No murmur heard. Pulmonary/Chest: Effort normal. No respiratory distress. He has  no wheezes. He has rales in the right lower field.  Musculoskeletal: He exhibits no edema.  Neurological: He is alert and oriented to person, place, and time.  Skin: Skin is warm and dry.  Psychiatric: He  has a normal mood and affect. His behavior is normal. Thought content normal.          Assessment & Plan:  Pulmonary nodule- reviewed chart and see that pt did not complete follow up CT chest ordered in January.  Will reschedule.  CXR performed due to RLL rales is neg for infiltrate.

## 2016-04-14 ENCOUNTER — Other Ambulatory Visit: Payer: Self-pay | Admitting: Family

## 2016-04-14 ENCOUNTER — Telehealth: Payer: Self-pay | Admitting: Family

## 2016-04-14 DIAGNOSIS — R911 Solitary pulmonary nodule: Secondary | ICD-10-CM

## 2016-04-14 NOTE — Assessment & Plan Note (Addendum)
Pt is waiting on his x ray records and will bring to new ortho for evaluaiton.

## 2016-04-14 NOTE — Assessment & Plan Note (Signed)
BP stable on current meds.   

## 2016-04-14 NOTE — Telephone Encounter (Signed)
New insurance auth# [id number], order sent to [facility], they will contact patient directly

## 2016-04-14 NOTE — Telephone Encounter (Signed)
Please let pt know that I reviewed his chart and see that he did not complete CT chest in January to follow up on pulmonary nodule.  Please reschedule CT chest.

## 2016-04-14 NOTE — Assessment & Plan Note (Signed)
Lab Results  Component Value Date   TSH 4.24 04/12/2016   TSH stable.  Continue current dose of synthroid.

## 2016-04-15 ENCOUNTER — Other Ambulatory Visit: Payer: Self-pay | Admitting: Family

## 2016-04-15 NOTE — Telephone Encounter (Signed)
-----   Message from Synthia Innocent sent at 04/15/2016  9:35 AM EDT ----- Can you please place order for lab work, patient is having CT scan. Thanks

## 2016-04-15 NOTE — Telephone Encounter (Signed)
Rx request to pharmacy/[facility]  

## 2016-04-15 NOTE — Telephone Encounter (Signed)
amLODipine (NORVASC) 5 MG tablet [id number]      Order Details    Dose, Route, Frequency: As Directed    Dispense Quantity:  90 tablet Refills:  1 Fills Remaining:  1          Sig: TAKE 1 TABLET(5 MG) BY MOUTH DAILY         Written Date:  04/12/16 Expiration Date:  04/12/17     Start Date:  04/12/16 End Date:  --     Ordering Provider:  -- Authorizing Provider:  [name], NP Ordering User:  [name], NP                    Original Order:  amLODipine (NORVASC) 5 MG tablet [id number]        Pharmacy:  [facility], [place] - [id number]

## 2016-04-15 NOTE — Telephone Encounter (Signed)
Lab order placed.

## 2016-04-18 ENCOUNTER — Other Ambulatory Visit (INDEPENDENT_AMBULATORY_CARE_PROVIDER_SITE_OTHER): Payer: Medicare Other

## 2016-04-18 DIAGNOSIS — R911 Solitary pulmonary nodule: Secondary | ICD-10-CM

## 2016-04-18 LAB — BASIC METABOLIC PANEL
BUN: 12 mg/dL (ref 6–23)
CO2: 29 mEq/L (ref 19–32)
CREATININE: 0.81 mg/dL (ref 0.40–1.50)
Calcium: 9.5 mg/dL (ref 8.4–10.5)
Chloride: 102 mEq/L (ref 96–112)
GFR: 105.42 mL/min (ref >60.00)
Glucose, Bld: 82 mg/dL (ref 70–99)
Potassium: 3.9 mEq/L (ref 3.5–5.1)
SODIUM: 139 meq/L (ref 135–145)

## 2016-04-22 ENCOUNTER — Ambulatory Visit (HOSPITAL_BASED_OUTPATIENT_CLINIC_OR_DEPARTMENT_OTHER)
Admission: RE | Admit: 2016-04-22 | Discharge: 2016-04-22 | Disposition: A | Discharge status: Home / Self Care | Payer: Medicare Other | Source: Ambulatory Visit | Attending: Family | Admitting: Family

## 2016-04-22 DIAGNOSIS — R911 Solitary pulmonary nodule: Secondary | ICD-10-CM

## 2016-04-25 ENCOUNTER — Telehealth: Payer: Self-pay | Admitting: Family

## 2016-04-25 IMAGING — CT NM PET TUM IMG INITIAL (PI) SKULL BASE T - THIGH
1 of 8 series · 1 of 25 positions shown · non-contrast
Comparison: Chest CT angiogram from 07/20/2015.

CLINICAL DATA: 53-year-old male with new innumerable pulmonary
nodules and right hilar lymphadenopathy, presenting for initial
staging.

EXAM:
NUCLEAR MEDICINE PET SKULL BASE TO THIGH
TECHNIQUE: 11.75 mCi F-18 FDG was injected intravenously. Full-ring PET imaging
was performed from the skull base to thigh after the radiotracer. CT
data was obtained and used for attenuation correction and anatomic
localization.
FASTING BLOOD GLUCOSE:  Value: 124 mg/dl

[Series 4: ct sk_thigh 5.0 hd_fov · axial · 5.0mm · 1.07mm/px · 1 of 233 slices shown]
[im 233/233  brain]
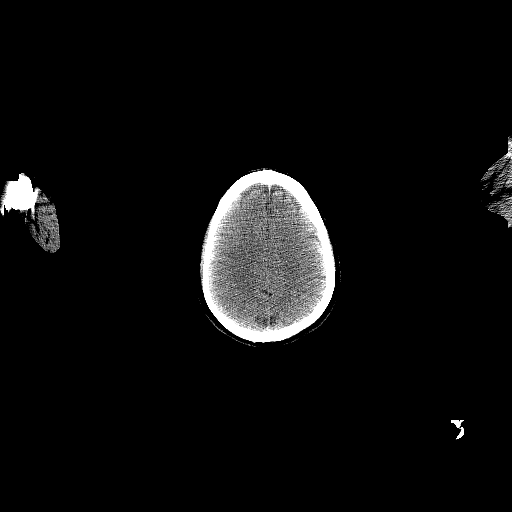

[1 of 25 positions shown; findings below may reference images not displayed]

FINDINGS: NECK

No hypermetabolic lymph nodes in the neck. A non FDG avid sclerotic
focus centered in the anterior right ethmoidal air cells is
suggestive of an osteoma. Mild mucosal thickening/mucous retention
cysts in the basilar right greater than left maxillary sinuses.

CHEST

No hypermetabolic mediastinal or hilar nodes. No axillary
lymphadenopathy. The previously described mildly enlarged
mediastinal nodes are mildly decreased in size and are not enlarged
by size criteria, for example a non hypermetabolic 0.9 cm short axis
right paratracheal node (series 4/image 69), previously 1.2 cm,
decreased. A non hypermetabolic 0.9 cm short axis subcarinal node
(4/88) is decreased from 1.3 cm. The previously described right
hilar lymphadenopathy is poorly assessed on this noncontrast study,
however appears mildly decreased in size, for example a non
hypermetabolic 1.6 cm short axis right hilar node (4/81), previously
2.0 cm, decreased. There is atherosclerosis of the left anterior
descending, left circumflex and right coronary arteries. Mildly
dilated main pulmonary artery (3.0 cm diameter), unchanged. Minimal
pericardial fluid/thickening, stable. Redemonstrated are at least 10
scattered pulmonary nodules in the right greater than left lungs,
the largest of which measures 1.8 x 1.6 cm in the right upper lobe
(series 4/image 70), which is minimally hypermetabolic (max SUV 2.8,
similar to the mediastinal blood pool), and which appears mildly
decreased in size from 2.1 x 1.8 cm on 07/20/15. No additional
hypermetabolic areas in the lungs, although the remaining pulmonary
nodules are subcentimeter and below PET resolution. Mild bilateral
lower lobe atelectasis, with no new focal lung consolidation.

ABDOMEN/PELVIS

No abnormal hypermetabolic activity within the liver, pancreas,
adrenal glands, or spleen. No hypermetabolic lymph nodes in the
abdomen or pelvis. Re- demonstrated are several simple liver cysts
throughout the liver, largest 2.4 cm in the anterior upper liver.
Additional subcentimeter hypodense liver lesions, too small to
characterize, not appreciably changed, likely benign cysts.
Hyperdense 1.9 cm anterior upper right renal lesion, hyperdense
cm exophytic left lower renal lesion, hyperdense 1.6 cm medial upper
left renal lesion and minimally complex 4.7 cm exophytic posterior
lower left renal cyst with tiny focus of mural hyperdensity, all
characterized as hemorrhagic cysts on 4214 MRI. Moderate colonic
diverticulosis. Status post right inguinal hernia repair. Small fat
containing left inguinal hernia.

SKELETON

No focal hypermetabolic activity to suggest skeletal metastasis.
IMPRESSION: 1. Decreasing non-hypermetabolic mediastinal and right hilar
lymphadenopathy, suggesting a resolving infectious/inflammatory
process.
2. Minimally hypermetabolic 1.8 cm right upper lobe pulmonary
nodule, which appears mildly decreased in size, which also suggests
a resolving infectious/inflammatory process.
3. Otherwise no hypermetabolic disease in the neck, chest, abdomen
or pelvis. Additional stable subcentimeter pulmonary nodules, which
are below PET resolution.
RECOMMENDATION:

Recommend a follow-up enhanced chest CT in 3 months.

## 2016-04-25 NOTE — Telephone Encounter (Signed)
-----   Message from Katha Hamming sent at 04/25/2016 12:42 PM EDT ----- Regarding: CT   CHEST Kyle Blair was scheduled for his CT on Friday.  Unfortunately, the CT scanner went down and he waited at least 30 minutes.   He stated he did not want the scan in the first place.  He left and did not want to reschedule.    Thanks, Hoyle Sauer

## 2016-04-26 ENCOUNTER — Other Ambulatory Visit: Payer: Self-pay | Admitting: Family

## 2016-04-27 ENCOUNTER — Telehealth: Payer: Self-pay | Admitting: Family

## 2016-04-27 NOTE — Telephone Encounter (Signed)
See my chart message

## 2016-04-28 ENCOUNTER — Other Ambulatory Visit: Payer: Self-pay | Admitting: Family

## 2016-04-29 ENCOUNTER — Encounter: Payer: Self-pay | Admitting: Family

## 2016-04-29 ENCOUNTER — Encounter (HOSPITAL_BASED_OUTPATIENT_CLINIC_OR_DEPARTMENT_OTHER): Payer: Self-pay

## 2016-04-29 ENCOUNTER — Ambulatory Visit (HOSPITAL_BASED_OUTPATIENT_CLINIC_OR_DEPARTMENT_OTHER)
Admission: RE | Admit: 2016-04-29 | Discharge: 2016-04-29 | Disposition: A | Discharge status: Home / Self Care | Payer: Medicare Other | Source: Ambulatory Visit | Attending: Family | Admitting: Family

## 2016-04-29 DIAGNOSIS — R918 Other nonspecific abnormal finding of lung field: Secondary | ICD-10-CM | POA: Diagnosis not present

## 2016-04-29 DIAGNOSIS — R911 Solitary pulmonary nodule: Secondary | ICD-10-CM | POA: Diagnosis not present

## 2016-04-29 DIAGNOSIS — I2510 Atherosclerotic heart disease of native coronary artery without angina pectoris: Secondary | ICD-10-CM | POA: Diagnosis not present

## 2016-04-29 MED ORDER — IOPAMIDOL (ISOVUE-300) INJECTION 61%
80.0000 mL | Freq: Once | INTRAVENOUS | Status: AC | PRN
  Administered 2016-04-29: 80 mL via INTRAVENOUS

## 2016-05-24 ENCOUNTER — Ambulatory Visit (INDEPENDENT_AMBULATORY_CARE_PROVIDER_SITE_OTHER): Payer: Medicare Other | Admitting: Family

## 2016-05-24 ENCOUNTER — Ambulatory Visit (HOSPITAL_BASED_OUTPATIENT_CLINIC_OR_DEPARTMENT_OTHER)
Admission: RE | Admit: 2016-05-24 | Discharge: 2016-05-24 | Disposition: A | Discharge status: Home / Self Care | Payer: Medicare Other | Source: Ambulatory Visit | Attending: Family | Admitting: Family

## 2016-05-24 ENCOUNTER — Encounter: Payer: Self-pay | Admitting: Family

## 2016-05-24 DIAGNOSIS — J45909 Unspecified asthma, uncomplicated: Secondary | ICD-10-CM

## 2016-05-24 DIAGNOSIS — B370 Candidal stomatitis: Secondary | ICD-10-CM | POA: Diagnosis not present

## 2016-05-24 DIAGNOSIS — J209 Acute bronchitis, unspecified: Secondary | ICD-10-CM

## 2016-05-24 DIAGNOSIS — R05 Cough: Secondary | ICD-10-CM | POA: Insufficient documentation

## 2016-05-24 DIAGNOSIS — R918 Other nonspecific abnormal finding of lung field: Secondary | ICD-10-CM | POA: Diagnosis not present

## 2016-05-24 DIAGNOSIS — R059 Cough, unspecified: Secondary | ICD-10-CM

## 2016-05-24 MED ORDER — TAMSULOSIN HCL 0.4 MG PO CAPS
ORAL_CAPSULE | ORAL | Status: DC
Start: 2016-05-24 — End: 2016-12-01

## 2016-05-24 MED ORDER — AZITHROMYCIN 250 MG PO TABS: ORAL_TABLET | ORAL | Status: DC

## 2016-05-24 MED ORDER — NYSTATIN 100000 UNIT/ML MT SUSP: 5.0000 mL | Freq: Four times a day (QID) | OROMUCOSAL | Status: DC

## 2016-05-24 MED ORDER — PREDNISONE 10 MG PO TABS: ORAL_TABLET | ORAL | Status: DC

## 2016-05-24 MED ORDER — METHYLPREDNISOLONE SODIUM SUCC 125 MG IJ SOLR
125.0000 mg | Freq: Once | INTRAMUSCULAR | Status: AC
  Administered 2016-05-24: 125 mg via INTRAMUSCULAR

## 2016-05-24 NOTE — Patient Instructions (Addendum)
Start zpak today. (antibiotic) Start prednisone tomorrow AM. Start nystatin 4 times daily for thrush. Continue albuterol every 6 hours. Continue advair.  Call if symptoms wirsen or if symptoms do not improve in 3 days.

## 2016-05-24 NOTE — Progress Notes (Signed)
Pre visit review using our clinic review tool, if applicable. No additional management support is needed unless otherwise documented below in the visit note. 

## 2016-05-24 NOTE — Addendum Note (Signed)
Addended by: Kelle Darting A on: 05/24/2016 03:14 PM   Modules accepted: Orders

## 2016-05-24 NOTE — Progress Notes (Signed)
Subjective:    Patient ID: Kyle Blair, male    DOB: 03-29-1961, 55 y.o.   MRN: [id number]  HPI  Kyle Blair is a 55 yr old male who presents today with chief complaint of cough.  Initially developed cold/congestion symptoms began Tuesday/wednesday as a cold. Then "settled in my chest." Has tried nyquil/dayquil/mucinex.  Denies fever, Nausea/vomitting or diarrhea.   Review of Systems See HPI  Past Medical History  Diagnosis Date  . Asthma   . Polycystic kidney disease   . Hypothyroid   . GERD with stricture   . BPH (benign prostatic hypertrophy)   . Hyperlipidemia   . Lung nodule 02/03/2012  . History of stroke 2004  . Stroke ([place])   . Arthritis   . Hypertension      Social History   Social History  . Marital Status: Married    Spouse Name: N/A  . Number of Children: 2  . Years of Education: N/A   Occupational History  . DISABLED    Social History Main Topics  . Smoking status: Former Smoker -- 1.00 packs/day for 20 years    Types: Cigarettes    Quit date: 03/15/2015  . Smokeless tobacco: Never Used  . Alcohol Use: 0.0 oz/week    0 Standard drinks or equivalent per week     Comment: rarely  . Drug Use: No  . Sexual Activity: Not on file   Other Topics Concern  . Not on file   Social History Narrative   Caffeine use:  1 pot coffee daily, 1 glass tea   Regular exercise:  No. Has active lifestyle.   2 biological and 1 stepson.   Former smoker- quit in 2007   He is on disability- reports that he has a history of heat stroke.               Past Surgical History  Procedure Laterality Date  . Hernia repair  2005    with mesh  . Nasal polyp excision  1992 or 93  . Carpal tunnel release    . Knee arthroscopy Bilateral 12/05/13    cyst and bone spur removed from left knee per pt.  . Colonoscopy    . Polypectomy    . Knee arthroscopy Left 01/2015  . 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 Heart disease Mother     pacemaker  . Diabetes Father   . Kidney disease Father   . Diabetes Sister   . Kidney disease Sister   . Colon cancer Neg Hx   . Esophageal cancer Neg Hx   . Rectal cancer Neg Hx   . Stomach cancer Neg Hx   . Prostate cancer Neg Hx   . Pancreatic cancer Neg Hx     Allergies  Allergen Reactions  . Aspirin Anaphylaxis  . Doxycycline Hyclate Swelling    Facial Swelling  . Ibuprofen Anaphylaxis  . Peanut Butter Flavor Anaphylaxis  . Cephalexin Rash    Current Outpatient Prescriptions on File Prior to Visit  Medication Sig Dispense Refill  . acetaminophen (TYLENOL) 325 MG tablet Take 1-2 tablets (325-650 mg total) by mouth every 6 (six) hours as needed.    Marland Kitchen albuterol (PROAIR HFA) 108 (90 Base) MCG/ACT inhaler Inhale 2 puffs into the lungs every 4 (four)  hours as needed for wheezing or shortness of breath. 8.5 g 0  . amLODipine (NORVASC) 5 MG tablet TAKE 1 TABLET(5 MG) BY MOUTH DAILY 90 tablet 1  . benazepril (LOTENSIN) 10 MG tablet TAKE 1 TABLET(10 MG) BY MOUTH DAILY 30 tablet 5  . diphenhydrAMINE (BENADRYL) 25 MG tablet Take 25 mg by mouth at bedtime as needed for sleep.     Marland Kitchen docusate sodium (COLACE) 100 MG capsule Take 1 capsule (100 mg total) by mouth daily. 10 capsule 0  . Fluticasone-Salmeterol (ADVAIR) 250-50 MCG/DOSE AEPB Inhale 1 puff into the lungs 2 (two) times daily. 60 each 5  . furosemide (LASIX) 20 MG tablet Take 1 tablet (20 mg total) by mouth 2 (two) times daily. 180 tablet 0  . gabapentin (NEURONTIN) 300 MG capsule Take 300 mg by mouth 2 (two) times daily.  0  . levothyroxine (SYNTHROID, LEVOTHROID) 175 MCG tablet TAKE ONE (1) TABLET EACH DAY BEFORE BREAKFAST 90 tablet 0  . lovastatin (MEVACOR) 40 MG tablet TAKE 1 TABLET(40 MG) BY MOUTH AT BEDTIME 90 tablet 0  . methocarbamol (ROBAXIN) 500 MG tablet Take 1  tablet (500 mg total) by mouth every 6 (six) hours as needed for muscle spasms. 50 tablet 0  . montelukast (SINGULAIR) 10 MG tablet Take 1 tablet (10 mg total) by mouth every morning. 90 tablet 1  . nystatin (MYCOSTATIN) 100000 UNIT/ML suspension Take 5 mLs (500,000 Units total) by mouth 4 (four) times daily. 240 mL 0  . omeprazole (PRILOSEC) 40 MG capsule TAKE 1 CAPSULE BY MOUTH EVERY DAY 90 capsule 1  . oxyCODONE (OXYCONTIN) 40 mg 12 hr tablet Take 1 tablet by mouth 2 (two) times daily.  0  . oxyCODONE (ROXICODONE) 15 MG immediate release tablet Take 15 mg by mouth every 4 (four) hours as needed for pain.    . polyethylene glycol (MIRALAX / GLYCOLAX) packet Take 17 g by mouth 2 (two) times daily. (Patient taking differently: Take 17 g by mouth 2 (two) times daily as needed. ) 14 each 0  . rivaroxaban (XARELTO) 10 MG TABS tablet Take 1 tablet (10 mg total) by mouth daily. 14 tablet 0   No current facility-administered medications on file prior to visit.    BP 132/82 mmHg  Pulse 72  Temp(Src) 98.2 F (36.8 C) (Oral)  Resp 16  Ht 5\' 5"  (1.651 m)  Wt 218 lb (98.884 kg)  BMI 36.28 kg/m2  SpO2 95%       Objective:   Physical Exam  Constitutional: He is oriented to person, place, and time. He appears well-developed and well-nourished. No distress.  HENT:  Head: Normocephalic and atraumatic.  Right Ear: Tympanic membrane and ear canal normal.  Left Ear: Tympanic membrane and ear canal normal.  Mouth/Throat: Posterior oropharyngeal erythema present. No posterior oropharyngeal edema.  + oral thrush noted on tongue  Cardiovascular: Normal rate and regular rhythm.   No murmur heard. Pulmonary/Chest: Effort normal and breath sounds normal. No respiratory distress. He has no wheezes. He has no rales.  Musculoskeletal: He exhibits no edema.  Neurological: He is alert and oriented to person, place, and time.  Skin: Skin is warm and dry.  Psychiatric: He has a normal mood and affect. His  behavior is normal. Thought content normal.          Assessment & Plan:  Bronchitis with acute asthma exacerbation- IM solumedrol here today, to be followed by slow pred taper. Encouraged pt to continue Q6 hr albuterol nebs, advair.  Rx with empiric Zpak, obtain CXR to exclude pneumonia.   Oral thrush- rx with nystatin

## 2016-05-25 ENCOUNTER — Encounter: Payer: Self-pay | Admitting: Family

## 2016-07-07 ENCOUNTER — Other Ambulatory Visit: Payer: Self-pay | Admitting: Family

## 2016-07-08 NOTE — Telephone Encounter (Signed)
Rx for the Levothyroxine was filled and sent to the pharmacy by e-script.  Rx for Amlodipine was denied-Filled on (04/12/16) #90,1.//AB/CMA

## 2016-07-09 ENCOUNTER — Other Ambulatory Visit: Payer: Self-pay | Admitting: Physician Assistant

## 2016-07-10 NOTE — Telephone Encounter (Signed)
Will defer further refills of patient's medications to PCP  

## 2016-07-12 ENCOUNTER — Ambulatory Visit: Payer: Medicare Other | Admitting: Family

## 2016-07-12 NOTE — Telephone Encounter (Signed)
Refills sent

## 2016-07-13 ENCOUNTER — Encounter: Payer: Self-pay | Admitting: Family

## 2016-07-13 ENCOUNTER — Ambulatory Visit (INDEPENDENT_AMBULATORY_CARE_PROVIDER_SITE_OTHER): Payer: Medicare Other | Admitting: Family

## 2016-07-13 VITALS — BP 130/84 | HR 60 | Temp 97.9°F | Resp 16 | Ht 65.0 in | Wt 224.5 lb

## 2016-07-13 DIAGNOSIS — J45901 Unspecified asthma with (acute) exacerbation: Secondary | ICD-10-CM | POA: Diagnosis not present

## 2016-07-13 DIAGNOSIS — I10 Essential (primary) hypertension: Secondary | ICD-10-CM | POA: Diagnosis not present

## 2016-07-13 DIAGNOSIS — E039 Hypothyroidism, unspecified: Secondary | ICD-10-CM | POA: Diagnosis not present

## 2016-07-13 MED ORDER — FLUTICASONE-SALMETEROL 250-50 MCG/DOSE IN AEPB: 1.0000 | INHALATION_SPRAY | Freq: Two times a day (BID) | RESPIRATORY_TRACT | Status: DC

## 2016-07-13 MED ORDER — PREDNISONE 10 MG PO TABS: ORAL_TABLET | ORAL | Status: DC

## 2016-07-13 MED ORDER — ALBUTEROL SULFATE HFA 108 (90 BASE) MCG/ACT IN AERS: 2.0000 | INHALATION_SPRAY | RESPIRATORY_TRACT | Status: DC | PRN

## 2016-07-13 NOTE — Progress Notes (Signed)
Pre visit review using our clinic review tool, if applicable. No additional management support is needed unless otherwise documented below in the visit note. 

## 2016-07-13 NOTE — Assessment & Plan Note (Signed)
BP stable on current medications, continue same. 

## 2016-07-13 NOTE — Progress Notes (Signed)
Subjective:    Patient ID: Kyle Blair, male    DOB: 27-Jul-1961, 55 y.o.   MRN: [id number]  HPI  Kyle Blair is a 55 yr old male who presents today for follow up.  1) Hypothyroid- maintained on synthroid 175 mcg. Lab Results  Component Value Date   TSH 4.24 04/12/2016   2) HTN- on benazepril, furosemide, amlodipine.  BP Readings from Last 3 Encounters:  07/13/16 130/84  05/24/16 132/82  04/12/16 112/73   3) asthma- reports chest has been tight x 1 week.    Review of Systems See HPI  Past Medical History  Diagnosis Date  . Asthma   . Polycystic kidney disease   . Hypothyroid   . GERD with stricture   . BPH (benign prostatic hypertrophy)   . Hyperlipidemia   . Lung nodule 02/03/2012  . History of stroke 2004  . Stroke ([place])   . Arthritis   . Hypertension      Social History   Social History  . Marital Status: Married    Spouse Name: N/A  . Number of Children: 2  . Years of Education: N/A   Occupational History  . DISABLED    Social History Main Topics  . Smoking status: Former Smoker -- 1.00 packs/day for 20 years    Types: Cigarettes    Quit date: 03/15/2015  . Smokeless tobacco: Never Used  . Alcohol Use: 0.0 oz/week    0 Standard drinks or equivalent per week     Comment: rarely  . Drug Use: No  . Sexual Activity: Not on file   Other Topics Concern  . Not on file   Social History Narrative   Caffeine use:  1 pot coffee daily, 1 glass tea   Regular exercise:  No. Has active lifestyle.   2 biological and 1 stepson.   Former smoker- quit in 2007   Kyle Blair is on disability- reports that Kyle Blair has a history of heat stroke.               Past Surgical History  Procedure Laterality Date  . Hernia repair  2005    with mesh  . Nasal polyp excision  1992 or 93  . Carpal tunnel release    . Knee arthroscopy Bilateral 12/05/13    cyst and bone spur removed from left knee per pt.  . Colonoscopy    . Polypectomy    . Knee arthroscopy Left 01/2015    . 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 Heart disease Mother     pacemaker  . Diabetes Father   . Kidney disease Father   . Diabetes Sister   . Kidney disease Sister   . Colon cancer Neg Hx   . Esophageal cancer Neg Hx   . Rectal cancer Neg Hx   . Stomach cancer Neg Hx   . Prostate cancer Neg Hx   . Pancreatic cancer Neg Hx     Allergies  Allergen Reactions  . Aspirin Anaphylaxis  . Doxycycline Hyclate Swelling    Facial Swelling  . Ibuprofen Anaphylaxis  . Peanut Butter Flavor Anaphylaxis  . Cephalexin Rash    Current Outpatient Prescriptions on File Prior to Visit  Medication Sig Dispense Refill  . acetaminophen (TYLENOL) 325 MG tablet Take 1-2 tablets (325-650  mg total) by mouth every 6 (six) hours as needed.    Marland Kitchen amLODipine (NORVASC) 5 MG tablet TAKE 1 TABLET(5 MG) BY MOUTH DAILY 90 tablet 1  . azithromycin (ZITHROMAX) 250 MG tablet 2 tabs by mouth today, then once tab once daily for 4 more day. 6 tablet 0  . benazepril (LOTENSIN) 10 MG tablet TAKE 1 TABLET(10 MG) BY MOUTH DAILY 30 tablet 5  . diphenhydrAMINE (BENADRYL) 25 MG tablet Take 25 mg by mouth at bedtime as needed for sleep.     Marland Kitchen docusate sodium (COLACE) 100 MG capsule Take 1 capsule (100 mg total) by mouth daily. 10 capsule 0  . Fluticasone-Salmeterol (ADVAIR) 250-50 MCG/DOSE AEPB Inhale 1 puff into the lungs 2 (two) times daily. 60 each 5  . furosemide (LASIX) 20 MG tablet Take 1 tablet (20 mg total) by mouth 2 (two) times daily. 180 tablet 0  . gabapentin (NEURONTIN) 300 MG capsule Take 300 mg by mouth 2 (two) times daily.  0  . levothyroxine (SYNTHROID, LEVOTHROID) 175 MCG tablet TAKE ONE (1) TABLET BY MOUTH EVERY DAY BEFORE BREAKFAST 90 tablet 1  . lovastatin (MEVACOR) 40 MG tablet TAKE 1 TABLET(40 MG) BY MOUTH AT  BEDTIME 90 tablet 1  . methocarbamol (ROBAXIN) 500 MG tablet Take 1 tablet (500 mg total) by mouth every 6 (six) hours as needed for muscle spasms. 50 tablet 0  . montelukast (SINGULAIR) 10 MG tablet Take 1 tablet (10 mg total) by mouth every morning. 90 tablet 1  . nystatin (MYCOSTATIN) 100000 UNIT/ML suspension Take 5 mLs (500,000 Units total) by mouth 4 (four) times daily. 240 mL 0  . omeprazole (PRILOSEC) 40 MG capsule TAKE 1 CAPSULE BY MOUTH EVERY DAY 90 capsule 1  . oxyCODONE (OXYCONTIN) 40 mg 12 hr tablet Take 1 tablet by mouth 2 (two) times daily.  0  . oxyCODONE (ROXICODONE) 15 MG immediate release tablet Take 15 mg by mouth every 4 (four) hours as needed for pain.    . polyethylene glycol (MIRALAX / GLYCOLAX) packet Take 17 g by mouth 2 (two) times daily. (Patient taking differently: Take 17 g by mouth 2 (two) times daily as needed. ) 14 each 0  . predniSONE (DELTASONE) 10 MG tablet 4 tabs once daily for 3 days, then 3 tabs once daily x 3 days, then 2 tabs once daily x 3 days, then 1 tab daily x 3 days then stop 30 tablet 0  . rivaroxaban (XARELTO) 10 MG TABS tablet Take 1 tablet (10 mg total) by mouth daily. 14 tablet 0  . tamsulosin (FLOMAX) 0.4 MG CAPS capsule TAKE 1 CAPSULE(0.4 MG) BY MOUTH AT BEDTIME 90 capsule 1   No current facility-administered medications on file prior to visit.    BP 130/84 mmHg  Pulse 60  Temp(Src) 97.9 F (36.6 C) (Oral)  Resp 16  Ht 5\' 5"  (1.651 m)  Wt 224 lb 8 oz (101.833 kg)  BMI 37.36 kg/m2  SpO2 96%       Objective:   Physical Exam  Constitutional: Kyle Blair is oriented to person, place, and time. Kyle Blair appears well-developed and well-nourished. No distress.  HENT:  Head: Normocephalic and atraumatic.  Cardiovascular: Normal rate and regular rhythm.   No murmur heard. Pulmonary/Chest: Effort normal. No respiratory distress. Kyle Blair has no rales.  Diminished breath sounds at the bases  Musculoskeletal: Kyle Blair exhibits no edema.  Neurological: Kyle Blair is alert  and oriented to person, place, and time.  Skin: Skin is warm and dry.  Psychiatric:  Kyle Blair has a normal mood and affect. His behavior is normal. Thought content normal.          Assessment & Plan:  Asthma with acute exacerbation- continue albuterol/advair- rx with pred taper. Pt is instructed to call if symptoms worsen or if symptoms do not improve.

## 2016-07-13 NOTE — Patient Instructions (Signed)
Please start prednisone, continue advair and albuterol. Call if breathing worsens or if it does not improve.

## 2016-07-13 NOTE — Assessment & Plan Note (Signed)
Stable on synthroid, continue same.   ?

## 2016-07-30 ENCOUNTER — Other Ambulatory Visit: Payer: Self-pay | Admitting: Family

## 2016-08-25 ENCOUNTER — Other Ambulatory Visit: Payer: Self-pay | Admitting: Family

## 2016-08-25 NOTE — Telephone Encounter (Signed)
Medication filled to pharmacy as requested.   

## 2016-10-11 ENCOUNTER — Ambulatory Visit (INDEPENDENT_AMBULATORY_CARE_PROVIDER_SITE_OTHER): Payer: Medicare Other | Admitting: Family

## 2016-10-11 ENCOUNTER — Encounter: Payer: Self-pay | Admitting: Family

## 2016-10-11 VITALS — BP 109/76 | HR 82 | Temp 98.3°F | Resp 18 | Ht 65.0 in | Wt 216.6 lb

## 2016-10-11 DIAGNOSIS — Z23 Encounter for immunization: Secondary | ICD-10-CM | POA: Diagnosis not present

## 2016-10-11 DIAGNOSIS — I10 Essential (primary) hypertension: Secondary | ICD-10-CM

## 2016-10-11 DIAGNOSIS — E039 Hypothyroidism, unspecified: Secondary | ICD-10-CM

## 2016-10-11 DIAGNOSIS — N401 Enlarged prostate with lower urinary tract symptoms: Secondary | ICD-10-CM

## 2016-10-11 DIAGNOSIS — E785 Hyperlipidemia, unspecified: Secondary | ICD-10-CM

## 2016-10-11 DIAGNOSIS — J45909 Unspecified asthma, uncomplicated: Secondary | ICD-10-CM

## 2016-10-11 LAB — LIPID PANEL
CHOL/HDL RATIO: 5
CHOLESTEROL: 157 mg/dL (ref 0–200)
HDL: 32.3 mg/dL — AB (ref >39.00)
NonHDL: 124.62
TRIGLYCERIDES: 241 mg/dL — AB (ref 0.0–149.0)
VLDL: 48.2 mg/dL — ABNORMAL HIGH (ref 0.0–40.0)

## 2016-10-11 LAB — BASIC METABOLIC PANEL
BUN: 9 mg/dL (ref 6–23)
CALCIUM: 9.7 mg/dL (ref 8.4–10.5)
CHLORIDE: 104 meq/L (ref 96–112)
CO2: 28 meq/L (ref 19–32)
CREATININE: 0.8 mg/dL (ref 0.40–1.50)
GFR: 106.75 mL/min (ref >60.00)
Glucose, Bld: 95 mg/dL (ref 70–99)
Potassium: 4.1 mEq/L (ref 3.5–5.1)
Sodium: 140 mEq/L (ref 135–145)

## 2016-10-11 LAB — TSH: TSH: 4.74 u[IU]/mL — ABNORMAL HIGH (ref 0.35–4.50)

## 2016-10-11 LAB — LDL CHOLESTEROL, DIRECT: LDL DIRECT: 97 mg/dL

## 2016-10-11 MED ORDER — VARENICLINE TARTRATE 0.5 MG X 11 & 1 MG X 42 PO MISC: ORAL | 0 refills | Status: DC

## 2016-10-11 MED FILL — CHANTIX STARTING MONTH BOX: 0.5 MG X 11 | 28 days supply | Qty: 53 | Fill #0

## 2016-10-11 NOTE — Assessment & Plan Note (Signed)
BP stable on current meds, continue same.  

## 2016-10-11 NOTE — Progress Notes (Signed)
Pre visit review using our clinic review tool, if applicable. No additional management support is needed unless otherwise documented below in the visit note. 

## 2016-10-11 NOTE — Assessment & Plan Note (Signed)
Clinically stable on synthroid. Obtain follow up tsh. 

## 2016-10-11 NOTE — Progress Notes (Signed)
Subjective:    Patient ID: Kyle Blair, male    DOB: [birth year], 55 y.o.   MRN: [id number]  HPI  Mr. Casselman is a 55 yr old male who presents today for follow up.  1) Asthma- has started smoking again, requests rx for chantix. As had a lot of stress.  He is smoking about 1/2 ppd x 2 months.  He is maintained on advair.  2) hypothyroid- Continues synthroid.  Lab Results  Component Value Date   TSH 4.24 04/12/2016   3) HTN-  Maintained on amlodipine, furosemide BP Readings from Last 3 Encounters:  10/11/16 109/76  07/13/16 130/84  05/24/16 132/82   4) Hyperlipidemia - continues lovastatin. Denies myalgia.  Lab Results  Component Value Date   CHOL 141 01/11/2016   HDL 40.30 01/11/2016   LDLCALC 68 01/11/2016   TRIG 164.0 (H) 01/11/2016   CHOLHDL 3 01/11/2016   5) BPH- on flomax, denies urinary problems.    Review of Systems See HPI  Past Medical History:  Diagnosis Date  . Arthritis   . Asthma   . BPH (benign prostatic hypertrophy)   . GERD with stricture   . History of stroke 2004  . Hyperlipidemia   . Hypertension   . Hypothyroid   . Lung nodule 02/03/2012  . Polycystic kidney disease   . Stroke [facility])      Social History   Social History  . Marital status: Married    Spouse name: N/A  . Number of children: 2  . Years of education: N/A   Occupational History  . DISABLED Unemployed   Social History Main Topics  . Smoking status: Current Every Day Smoker    Packs/day: 0.50    Years: 20.00    Types: Cigarettes    Last attempt to quit: 03/15/2015  . Smokeless tobacco: Never Used  . Alcohol use No     Comment: rarely  . Drug use: No  . Sexual activity: Not on file   Other Topics Concern  . Not on file   Social History Narrative   Caffeine use:  1 pot coffee daily, 1 glass tea   Regular exercise:  No. Has active lifestyle.   2 biological and 1 stepson.   Former smoker- quit in 2007   He is on disability- reports that he has a history of  heat stroke.               Past Surgical History:  Procedure Laterality Date  . CARPAL TUNNEL RELEASE    . COLONOSCOPY    . HERNIA REPAIR  2005   with mesh  . KNEE ARTHROSCOPY Bilateral 12/05/13   cyst and bone spur removed from left knee per pt.  Marland Kitchen KNEE ARTHROSCOPY Left 01/2015  . NASAL POLYP EXCISION  1992 or 93  . POLYPECTOMY    . TOTAL KNEE ARTHROPLASTY Right 05/19/2015   Procedure: RIGHT TOTAL KNEE ARTHROPLASTY;  Surgeon: Paralee Cancel, MD;  Location: [facility];  Service: Orthopedics;  Laterality: Right;    Family History  Problem Relation Age of Onset  . Heart disease Mother     pacemaker  . Diabetes Father   . Kidney disease Father   . Diabetes Sister   . Kidney disease Sister   . Colon cancer Neg Hx   . Esophageal cancer Neg Hx   . Rectal cancer Neg Hx   . Stomach cancer Neg Hx   . Prostate cancer Neg Hx   . Pancreatic cancer Neg Hx  Allergies  Allergen Reactions  . Aspirin Anaphylaxis  . Doxycycline Hyclate Swelling    Facial Swelling  . Ibuprofen Anaphylaxis  . Peanut Butter Flavor Anaphylaxis  . Cephalexin Rash    Current Outpatient Prescriptions on File Prior to Visit  Medication Sig Dispense Refill  . acetaminophen (TYLENOL) 325 MG tablet Take 1-2 tablets (325-650 mg total) by mouth every 6 (six) hours as needed.    Marland Kitchen albuterol (PROVENTIL HFA;VENTOLIN HFA) 108 (90 Base) MCG/ACT inhaler Inhale 2 puffs into the lungs every 4 (four) hours as needed for wheezing or shortness of breath. 8 g 5  . amLODipine (NORVASC) 5 MG tablet TAKE 1 TABLET(5 MG) BY MOUTH DAILY 90 tablet 1  . benazepril (LOTENSIN) 10 MG tablet TAKE 1 TABLET(10 MG) BY MOUTH DAILY 30 tablet 5  . diphenhydrAMINE (BENADRYL) 25 MG tablet Take 25 mg by mouth at bedtime as needed for sleep.     Marland Kitchen docusate sodium (COLACE) 100 MG capsule Take 1 capsule (100 mg total) by mouth daily. 10 capsule 0  . Fluticasone-Salmeterol (ADVAIR) 250-50 MCG/DOSE AEPB Inhale 1 puff into the lungs 2 (two) times  daily. 60 each 5  . furosemide (LASIX) 20 MG tablet Take 1 tablet (20 mg total) by mouth 2 (two) times daily. 180 tablet 0  . gabapentin (NEURONTIN) 300 MG capsule Take 300 mg by mouth 2 (two) times daily.  0  . levothyroxine (SYNTHROID, LEVOTHROID) 175 MCG tablet TAKE ONE (1) TABLET BY MOUTH EVERY DAY BEFORE BREAKFAST 90 tablet 1  . lovastatin (MEVACOR) 40 MG tablet TAKE 1 TABLET(40 MG) BY MOUTH AT BEDTIME 90 tablet 1  . methocarbamol (ROBAXIN) 500 MG tablet Take 1 tablet (500 mg total) by mouth every 6 (six) hours as needed for muscle spasms. 50 tablet 0  . montelukast (SINGULAIR) 10 MG tablet TAKE ONE TABLET DAILY IN THE MORNING 90 tablet 1  . omeprazole (PRILOSEC) 40 MG capsule TAKE ONE (1) CAPSULE EACH DAY 90 capsule 0  . oxyCODONE (OXYCONTIN) 40 mg 12 hr tablet Take 1 tablet by mouth 2 (two) times daily.  0  . oxyCODONE (ROXICODONE) 15 MG immediate release tablet Take 15 mg by mouth every 4 (four) hours as needed for pain.    . polyethylene glycol (MIRALAX / GLYCOLAX) packet Take 17 g by mouth 2 (two) times daily. (Patient taking differently: Take 17 g by mouth 2 (two) times daily as needed. ) 14 each 0  . tamsulosin (FLOMAX) 0.4 MG CAPS capsule TAKE 1 CAPSULE(0.4 MG) BY MOUTH AT BEDTIME 90 capsule 1   No current facility-administered medications on file prior to visit.     BP 109/76 (BP Location: Left Arm, Patient Position: Sitting, Cuff Size: Large)   Pulse 82   Temp 98.3 F (36.8 C) (Oral)   Resp 18   Ht 5\' 5"  (1.651 m)   Wt 216 lb 9.6 oz (98.2 kg)   SpO2 95% Comment: room air  BMI 36.04 kg/m       Objective:   Physical Exam  Constitutional: He is oriented to person, place, and time. He appears well-developed and well-nourished. No distress.  HENT:  Head: Normocephalic and atraumatic.  Cardiovascular: Normal rate and regular rhythm.   No murmur heard. Pulmonary/Chest: Effort normal and breath sounds normal. No respiratory distress. He has no wheezes. He has no rales.    Musculoskeletal: He exhibits no edema.  Neurological: He is alert and oriented to person, place, and time.  Skin: Skin is warm and dry.  Psychiatric:  He has a normal mood and affect. His behavior is normal. Thought content normal.          Assessment & Plan:

## 2016-10-11 NOTE — Assessment & Plan Note (Signed)
Tolerating statin, obtain follow-up lipid panel. 

## 2016-10-11 NOTE — Assessment & Plan Note (Signed)
Stable, continue advair and prn albuterol.

## 2016-10-11 NOTE — Patient Instructions (Addendum)
Please begin chantix. Quit smoking 1 week after started.   Complete lab work prior to leaving.

## 2016-10-11 NOTE — Assessment & Plan Note (Signed)
Stable on flomax. Continue same.  

## 2016-10-12 ENCOUNTER — Telehealth: Payer: Self-pay | Admitting: Family

## 2016-10-12 MED ORDER — LEVOTHYROXINE SODIUM 200 MCG PO TABS: 200.0000 ug | ORAL_TABLET | Freq: Every day | ORAL | 2 refills | Status: DC

## 2016-10-12 NOTE — Telephone Encounter (Signed)
Please let pt know that lab work shows we should increase his synthroid to 200 mcg. Repeat TSH in 6 weeks. Kidney function and electrolytes are normal.  Your triglycerides are mildly elevated. Please work on avoiding concentrated sweets, and limiting white carbs (rice/bread/pasta/potatoes). Instead substitute whole grain versions with reasonable portions.

## 2016-10-12 NOTE — Telephone Encounter (Signed)
Attempted to reach pt's home # and call was answered by spouse who is in [place]. Attempted to reach pt's cell and received recording that call could not be completed as dialed. Mailed letter.

## 2016-10-13 ENCOUNTER — Other Ambulatory Visit: Payer: Self-pay | Admitting: Family

## 2016-10-13 NOTE — Telephone Encounter (Signed)
Medication filled to pharmacy as requested.   

## 2016-11-09 ENCOUNTER — Ambulatory Visit (INDEPENDENT_AMBULATORY_CARE_PROVIDER_SITE_OTHER): Payer: Medicare Other | Admitting: Family

## 2016-11-09 ENCOUNTER — Encounter: Payer: Self-pay | Admitting: Family

## 2016-11-09 VITALS — BP 117/73 | HR 80 | Temp 98.1°F | Resp 16 | Ht 65.0 in | Wt 217.4 lb

## 2016-11-09 DIAGNOSIS — I10 Essential (primary) hypertension: Secondary | ICD-10-CM | POA: Diagnosis not present

## 2016-11-09 DIAGNOSIS — Z23 Encounter for immunization: Secondary | ICD-10-CM | POA: Diagnosis not present

## 2016-11-09 DIAGNOSIS — Z0000 Encounter for general adult medical examination without abnormal findings: Secondary | ICD-10-CM

## 2016-11-09 DIAGNOSIS — R739 Hyperglycemia, unspecified: Secondary | ICD-10-CM | POA: Diagnosis not present

## 2016-11-09 DIAGNOSIS — E039 Hypothyroidism, unspecified: Secondary | ICD-10-CM

## 2016-11-09 LAB — TSH: TSH: 3 u[IU]/mL (ref 0.35–4.50)

## 2016-11-09 LAB — BASIC METABOLIC PANEL
BUN: 12 mg/dL (ref 6–23)
CALCIUM: 9.7 mg/dL (ref 8.4–10.5)
CO2: 28 mEq/L (ref 19–32)
Chloride: 103 mEq/L (ref 96–112)
Creatinine, Ser: 0.76 mg/dL (ref 0.40–1.50)
GFR: 113.23 mL/min (ref >60.00)
Glucose, Bld: 113 mg/dL — ABNORMAL HIGH (ref 70–99)
POTASSIUM: 4 meq/L (ref 3.5–5.1)
SODIUM: 139 meq/L (ref 135–145)

## 2016-11-09 LAB — HEMOGLOBIN A1C: HEMOGLOBIN A1C: 5.6 % (ref 4.6–6.5)

## 2016-11-09 MED ORDER — LEVOTHYROXINE SODIUM 200 MCG PO TABS: 200.0000 ug | ORAL_TABLET | Freq: Every day | ORAL | 2 refills | Status: DC

## 2016-11-09 MED ORDER — VARENICLINE TARTRATE 1 MG PO TABS: 1.0000 mg | ORAL_TABLET | Freq: Two times a day (BID) | ORAL | 1 refills | Status: DC

## 2016-11-09 NOTE — Progress Notes (Signed)
Pre visit review using our clinic review tool, if applicable. No additional management support is needed unless otherwise documented below in the visit note. 

## 2016-11-09 NOTE — Patient Instructions (Signed)
Continue chantix.  Please complete lab work prior to leaving.  Continue to work on diet/exercise and weight loss.

## 2016-11-09 NOTE — Progress Notes (Signed)
Subjective:    Patient ID: Kyle Blair, male    DOB: 1961-02-16, 55 y.o.   MRN: [id number]  HPI  Patient presents today for complete physical.  Immunizations: flu shot up to date.  Tetanus up to date. Pneumovax due Diet:  Trying ot eat healthy Wt Readings from Last 3 Encounters:  11/09/16 217 lb 6.4 oz (98.6 kg)  10/11/16 216 lb 9.6 oz (98.2 kg)  07/13/16 224 lb 8 oz (101.8 kg)  Exercise: reports that he stays active but no formal exercise.   Colonoscopy: due 2018  Review of Systems  Constitutional: Negative for unexpected weight change.  HENT: Negative for hearing loss and rhinorrhea.   Eyes: Negative for visual disturbance.  Respiratory: Negative for cough.   Cardiovascular: Negative for leg swelling.  Gastrointestinal: Negative for diarrhea.       Occasional constipation- resolves with prn stool softner  Genitourinary:       Denies voiding issues on flomax  Musculoskeletal: Negative for back pain.       Reports some chronic knee swelling R>L  Skin: Negative for rash.  Neurological: Negative for headaches.  Hematological: Negative for adenopathy.  Psychiatric/Behavioral:       Denies current depression/anxiety   Past Medical History:  Diagnosis Date  . Arthritis   . Asthma   . BPH (benign prostatic hypertrophy)   . GERD with stricture   . History of stroke 2004  . Hyperlipidemia   . Hypertension   . Hypothyroid   . Lung nodule 02/03/2012  . Polycystic kidney disease   . Stroke [facility])      Social History   Social History  . Marital status: Married    Spouse name: N/A  . Number of children: 2  . Years of education: N/A   Occupational History  . DISABLED Unemployed   Social History Main Topics  . Smoking status: Current Every Day Smoker    Packs/day: 0.50    Years: 20.00    Types: Cigarettes    Last attempt to quit: 03/15/2015  . Smokeless tobacco: Never Used  . Alcohol use No     Comment: rarely  . Drug use: No  . Sexual activity: Not on file     Other Topics Concern  . Not on file   Social History Narrative   Caffeine use:  1 pot coffee daily, 1 glass tea   Regular exercise:  No. Has active lifestyle.   2 biological and 1 stepson.   Former smoker- quit in 2007   He is on disability- reports that he has a history of heat stroke.               Past Surgical History:  Procedure Laterality Date  . CARPAL TUNNEL RELEASE    . COLONOSCOPY    . HERNIA REPAIR  2005   with mesh  . KNEE ARTHROSCOPY Bilateral 12/05/13   cyst and bone spur removed from left knee per pt.  Marland Kitchen KNEE ARTHROSCOPY Left 01/2015  . NASAL POLYP EXCISION  1992 or 93  . POLYPECTOMY    . TOTAL KNEE ARTHROPLASTY Right 05/19/2015   Procedure: RIGHT TOTAL KNEE ARTHROPLASTY;  Surgeon: Paralee Cancel, MD;  Location: [facility];  Service: Orthopedics;  Laterality: Right;    Family History  Problem Relation Age of Onset  . Heart disease Mother     pacemaker  . Diabetes Father   . Kidney disease Father   . Diabetes Sister   . Kidney disease Sister   .  Colon cancer Neg Hx   . Esophageal cancer Neg Hx   . Rectal cancer Neg Hx   . Stomach cancer Neg Hx   . Prostate cancer Neg Hx   . Pancreatic cancer Neg Hx     Allergies  Allergen Reactions  . Aspirin Anaphylaxis  . Doxycycline Hyclate Swelling    Facial Swelling  . Ibuprofen Anaphylaxis  . Peanut Butter Flavor Anaphylaxis  . Cephalexin Rash    Current Outpatient Prescriptions on File Prior to Visit  Medication Sig Dispense Refill  . acetaminophen (TYLENOL) 325 MG tablet Take 1-2 tablets (325-650 mg total) by mouth every 6 (six) hours as needed.    Marland Kitchen albuterol (PROVENTIL HFA;VENTOLIN HFA) 108 (90 Base) MCG/ACT inhaler Inhale 2 puffs into the lungs every 4 (four) hours as needed for wheezing or shortness of breath. 8 g 5  . amLODipine (NORVASC) 5 MG tablet TAKE ONE (1) TABLET EACH DAY 90 tablet 1  . benazepril (LOTENSIN) 10 MG tablet TAKE 1 TABLET(10 MG) BY MOUTH DAILY 30 tablet 5  . diphenhydrAMINE  (BENADRYL) 25 MG tablet Take 25 mg by mouth at bedtime as needed for sleep.     Marland Kitchen docusate sodium (COLACE) 100 MG capsule Take 1 capsule (100 mg total) by mouth daily. 10 capsule 0  . Fluticasone-Salmeterol (ADVAIR) 250-50 MCG/DOSE AEPB Inhale 1 puff into the lungs 2 (two) times daily. 60 each 5  . furosemide (LASIX) 20 MG tablet Take 1 tablet (20 mg total) by mouth 2 (two) times daily. 180 tablet 0  . gabapentin (NEURONTIN) 300 MG capsule Take 300 mg by mouth 2 (two) times daily.  0  . levothyroxine (SYNTHROID) 200 MCG tablet Take 1 tablet (200 mcg total) by mouth daily before breakfast. 30 tablet 2  . lovastatin (MEVACOR) 40 MG tablet TAKE ONE (1) TABLET AT BEDTIME 90 tablet 1  . methocarbamol (ROBAXIN) 500 MG tablet Take 1 tablet (500 mg total) by mouth every 6 (six) hours as needed for muscle spasms. 50 tablet 0  . montelukast (SINGULAIR) 10 MG tablet TAKE ONE TABLET DAILY IN THE MORNING 90 tablet 1  . omeprazole (PRILOSEC) 40 MG capsule TAKE ONE (1) CAPSULE EACH DAY 90 capsule 0  . oxyCODONE (OXYCONTIN) 40 mg 12 hr tablet Take 1 tablet by mouth 2 (two) times daily.  0  . oxyCODONE (ROXICODONE) 15 MG immediate release tablet Take 15 mg by mouth every 4 (four) hours as needed for pain.    . polyethylene glycol (MIRALAX / GLYCOLAX) packet Take 17 g by mouth 2 (two) times daily. (Patient taking differently: Take 17 g by mouth 2 (two) times daily as needed. ) 14 each 0  . tamsulosin (FLOMAX) 0.4 MG CAPS capsule TAKE 1 CAPSULE(0.4 MG) BY MOUTH AT BEDTIME 90 capsule 1  . varenicline (CHANTIX STARTING MONTH PAK) 0.5 MG X 11 & 1 MG X 42 tablet Take one 0.5 mg tablet by mouth once daily for 3 days, then increase to one 0.5 mg tablet twice daily for 4 days, then increase to one 1 mg tablet twice daily. 53 tablet 0   No current facility-administered medications on file prior to visit.     BP 117/73 (BP Location: Right Arm, Patient Position: Sitting, Cuff Size: Large)   Pulse 80   Temp 98.1 F (36.7 C)  (Oral)   Resp 16   Ht 5\' 5"  (1.651 m)   Wt 217 lb 6.4 oz (98.6 kg)   SpO2 96% Comment: RA  BMI 36.18 kg/m  Objective:   Physical Exam  Constitutional: He is oriented to person, place, and time. He appears well-developed and well-nourished. No distress.  HENT:  Head: Normocephalic and atraumatic.  Cardiovascular: Normal rate and regular rhythm.   No murmur heard. Pulmonary/Chest: Effort normal and breath sounds normal. No respiratory distress. He has no wheezes. He has no rales.  Musculoskeletal: He exhibits no edema.  Neurological: He is alert and oriented to person, place, and time.  Skin: Skin is warm and dry.  Psychiatric: He has a normal mood and affect. His behavior is normal. Thought content normal.          Assessment & Plan:  Preventative Care-  Immunizations reviewed- pneumovax given today.Colo up to date.  Will obtain routine lab work. Pt will schedule medicare wellness with RN. He is to continue to work on smoking cessation- refill provided for chantix.

## 2016-11-16 ENCOUNTER — Other Ambulatory Visit: Payer: Self-pay | Admitting: Family

## 2016-11-16 NOTE — Telephone Encounter (Signed)
Rx request Denied; last Rx to pharmacy 08/01/16, #[id number] Soon/SLS 11/22 Medication Detail   Disp Refills Start End   montelukast (SINGULAIR) 10 MG tablet 90 tablet 1 08/01/2016    Sig: TAKE ONE TABLET DAILY IN THE MORNING   E-Prescribing Status: Receipt confirmed by pharmacy (08/01/2016 1:11 PM EDT)   [place] [place] - [id number]

## 2016-12-01 ENCOUNTER — Other Ambulatory Visit: Payer: Self-pay | Admitting: Family

## 2016-12-02 ENCOUNTER — Encounter: Payer: Self-pay | Admitting: *Deleted

## 2016-12-02 ENCOUNTER — Ambulatory Visit (INDEPENDENT_AMBULATORY_CARE_PROVIDER_SITE_OTHER): Payer: Medicare Other | Admitting: *Deleted

## 2016-12-02 VITALS — BP 140/86 | HR 86 | Resp 18 | Ht 65.0 in | Wt 221.4 lb

## 2016-12-02 DIAGNOSIS — Z0000 Encounter for general adult medical examination without abnormal findings: Secondary | ICD-10-CM

## 2016-12-02 NOTE — Patient Instructions (Signed)
Follow-up w/ Kyle Alar, NP as scheduled. Bring a copy of your advance directives to your next office visit. Try to remember to use your cane.   Fall Prevention in the Home Introduction Falls can cause injuries. They can happen to people of all ages. There are many things you can do to make your home safe and to help prevent falls. What can I do on the outside of my home?  Regularly fix the edges of walkways and driveways and fix any cracks.  Remove anything that might make you trip as you walk through a door, such as a raised step or threshold.  Trim any bushes or trees on the path to your home.  Use bright outdoor lighting.  Clear any walking paths of anything that might make someone trip, such as rocks or tools.  Regularly check to see if handrails are loose or broken. Make sure that both sides of any steps have handrails.  Any raised decks and porches should have guardrails on the edges.  Have any leaves, snow, or ice cleared regularly.  Use sand or salt on walking paths during winter.  Clean up any spills in your garage right away. This includes oil or grease spills. What can I do in the bathroom?  Use night lights.  Install grab bars by the toilet and in the tub and shower. Do not use towel bars as grab bars.  Use non-skid mats or decals in the tub or shower.  If you need to sit down in the shower, use a plastic, non-slip stool.  Keep the floor dry. Clean up any water that spills on the floor as soon as it happens.  Remove soap buildup in the tub or shower regularly.  Attach bath mats securely with double-sided non-slip rug tape.  Do not have throw rugs and other things on the floor that can make you trip. What can I do in the bedroom?  Use night lights.  Make sure that you have a light by your bed that is easy to reach.  Do not use any sheets or blankets that are too big for your bed. They should not hang down onto the floor.  Have a firm chair  that has side arms. You can use this for support while you get dressed.  Do not have throw rugs and other things on the floor that can make you trip. What can I do in the kitchen?  Clean up any spills right away.  Avoid walking on wet floors.  Keep items that you use a lot in easy-to-reach places.  If you need to reach something above you, use a strong step stool that has a grab bar.  Keep electrical cords out of the way.  Do not use floor polish or wax that makes floors slippery. If you must use wax, use non-skid floor wax.  Do not have throw rugs and other things on the floor that can make you trip. What can I do with my stairs?  Do not leave any items on the stairs.  Make sure that there are handrails on both sides of the stairs and use them. Fix handrails that are broken or loose. Make sure that handrails are as long as the stairways.  Check any carpeting to make sure that it is firmly attached to the stairs. Fix any carpet that is loose or worn.  Avoid having throw rugs at the top or bottom of the stairs. If you do have throw rugs, attach them to  the floor with carpet tape.  Make sure that you have a light switch at the top of the stairs and the bottom of the stairs. If you do not have them, ask someone to add them for you. What else can I do to help prevent falls?  Wear shoes that:  Do not have high heels.  Have rubber bottoms.  Are comfortable and fit you well.  Are closed at the toe. Do not wear sandals.  If you use a stepladder:  Make sure that it is fully opened. Do not climb a closed stepladder.  Make sure that both sides of the stepladder are locked into place.  Ask someone to hold it for you, if possible.  Clearly mark and make sure that you can see:  Any grab bars or handrails.  First and last steps.  Where the edge of each step is.  Use tools that help you move around (mobility aids) if they are needed. These  include:  Canes.  Walkers.  Scooters.  Crutches.  Turn on the lights when you go into a dark area. Replace any light bulbs as soon as they burn out.  Set up your furniture so you have a clear path. Avoid moving your furniture around.  If any of your floors are uneven, fix them.  If there are any pets around you, be aware of where they are.  Review your medicines with your doctor. Some medicines can make you feel dizzy. This can increase your chance of falling. Ask your doctor what other things that you can do to help prevent falls. This information is not intended to replace advice given to you by your health care provider. Make sure you discuss any questions you have with your health care provider. Document Released: 10/08/2009 Document Revised: 05/19/2016 Document Reviewed: 01/16/2015  2017 Elsevier

## 2016-12-02 NOTE — Progress Notes (Signed)
Pre visit review using our clinic review tool, if applicable. No additional management support is needed unless otherwise documented below in the visit note. 

## 2016-12-02 NOTE — Progress Notes (Signed)
Subjective:   Kyle Blair is a 55 y.o. male who presents for an Initial Medicare Annual Wellness Visit.  Review of Systems  No ROS.  Medicare Wellness Visit.  Cardiac Risk Factors include: advanced age (>86men, >53 women);hypertension;male gender;sedentary lifestyle;obesity (BMI >30kg/m2);smoking/ tobacco exposure  Sleep patterns: has interrupted sleep. Wife is a restless sleeper and interrupts pts sleep. Sometimes feels rested on waking and sometimes does not. Gets up twice nightly to void. Was having some incontinent episodes when taking Flomax at night, so started taking Flomax in the morning and incontinence resolved.   Home Safety/Smoke Alarms: Feels safe in home. Smoke alarms in place.  Living environment; residence and Firearm Safety: 1-story house/ trailer, number of outside stairs: 0, number of inside stairs: 0, walk-in shower. Lives in home w/ wife and 62 y/o son. Safe storage of firearms discussed. Seat Belt Safety/Bike Helmet: Wears seat belt.   Counseling:   Eye Exam- Last eye exam 3 years ago. Due for exam, but pt states cannot afford right now. Dental- Does not see dentist regularly. Broken tooth about 2 weeks ago, using Orajel w/ relief of pain.  Male:   [facility]- last 10/06/14 w/ Dr. Zenovia Jarred. 3 sessile polyps removed, moderate diverticulosis. 3 year recall.  PSA- Not on file. Screening recommendations per PCP.       Objective:    Today's Vitals   12/02/16 1154 12/02/16 1156  BP: 140/86   Pulse: 86   Resp: 18   SpO2: 94%   Weight: 221 lb 6.4 oz (100.4 kg)   Height: 5\' 5"  (1.651 m)   PainSc:  5    Body mass index is 36.84 kg/m.  Current Medications (verified) Outpatient Encounter Prescriptions as of 12/02/2016  Medication Sig  . acetaminophen (TYLENOL) 325 MG tablet Take 1-2 tablets (325-650 mg total) by mouth every 6 (six) hours as needed.  Marland Kitchen albuterol (PROVENTIL HFA;VENTOLIN HFA) 108 (90 Base) MCG/ACT inhaler Inhale 2 puffs into the lungs every 4 (four)  hours as needed for wheezing or shortness of breath.  Marland Kitchen amLODipine (NORVASC) 5 MG tablet TAKE ONE (1) TABLET EACH DAY  . benazepril (LOTENSIN) 10 MG tablet TAKE 1 TABLET(10 MG) BY MOUTH DAILY  . diphenhydrAMINE (BENADRYL) 25 MG tablet Take 25 mg by mouth at bedtime as needed for sleep.   Marland Kitchen docusate sodium (COLACE) 100 MG capsule Take 1 capsule (100 mg total) by mouth daily. (Patient taking differently: Take 100 mg by mouth daily as needed. )  . Fluticasone-Salmeterol (ADVAIR) 250-50 MCG/DOSE AEPB Inhale 1 puff into the lungs 2 (two) times daily.  . furosemide (LASIX) 20 MG tablet Take 1 tablet (20 mg total) by mouth 2 (two) times daily. (Patient taking differently: Take 20 mg by mouth daily as needed. )  . gabapentin (NEURONTIN) 300 MG capsule Take 300 mg by mouth 4 (four) times daily.   Marland Kitchen levothyroxine (SYNTHROID) 200 MCG tablet Take 1 tablet (200 mcg total) by mouth daily before breakfast.  . lovastatin (MEVACOR) 40 MG tablet TAKE ONE (1) TABLET AT BEDTIME  . methocarbamol (ROBAXIN) 500 MG tablet Take 1 tablet (500 mg total) by mouth every 6 (six) hours as needed for muscle spasms.  . montelukast (SINGULAIR) 10 MG tablet TAKE ONE TABLET DAILY IN THE MORNING  . omeprazole (PRILOSEC) 40 MG capsule TAKE ONE CAPSULE BY MOUTH DAILY  . oxyCODONE (OXYCONTIN) 40 mg 12 hr tablet Take 1 tablet by mouth 2 (two) times daily.  Marland Kitchen oxyCODONE (ROXICODONE) 15 MG immediate release tablet Take  15 mg by mouth every 4 (four) hours as needed for pain.  . tamsulosin (FLOMAX) 0.4 MG CAPS capsule TAKE ONE CAPSULE BY MOUTH EVERY NIGHT AT BEDTIME (Patient taking differently: TAKE ONE CAPSULE BY MOUTH EVERY DAY)  . varenicline (CHANTIX CONTINUING MONTH PAK) 1 MG tablet Take 1 tablet (1 mg total) by mouth 2 (two) times daily.  . [DISCONTINUED] polyethylene glycol (MIRALAX / GLYCOLAX) packet Take 17 g by mouth 2 (two) times daily. (Patient not taking: Reported on 12/02/2016)   No facility-administered encounter medications on  file as of 12/02/2016.     Allergies (verified) Aspirin; Doxycycline hyclate; Ibuprofen; Peanut butter flavor; and Cephalexin   History: Past Medical History:  Diagnosis Date  . Arthritis   . Asthma   . BPH (benign prostatic hypertrophy)   . GERD with stricture   . History of stroke 2004  . Hyperlipidemia   . Hypertension   . Hypothyroid   . Lung nodule 02/03/2012  . Polycystic kidney disease   . Stroke [facility])    Past Surgical History:  Procedure Laterality Date  . CARPAL TUNNEL RELEASE    . COLONOSCOPY    . HERNIA REPAIR  2005   with mesh  . KNEE ARTHROSCOPY Bilateral 12/05/13   cyst and bone spur removed from left knee per pt.  Marland Kitchen KNEE ARTHROSCOPY Left 01/2015  . NASAL POLYP EXCISION  1992 or 93  . POLYPECTOMY    . TOTAL KNEE ARTHROPLASTY Right 05/19/2015   Procedure: RIGHT TOTAL KNEE ARTHROPLASTY;  Surgeon: Paralee Cancel, MD;  Location: [facility];  Service: Orthopedics;  Laterality: Right;   Family History  Problem Relation Age of Onset  . Heart disease Mother     pacemaker  . Diabetes Father   . Kidney disease Father   . Diabetes Sister   . Kidney disease Sister   . Colon cancer Neg Hx   . Esophageal cancer Neg Hx   . Rectal cancer Neg Hx   . Stomach cancer Neg Hx   . Prostate cancer Neg Hx   . Pancreatic cancer Neg Hx    Social History   Occupational History  . DISABLED Unemployed   Social History Main Topics  . Smoking status: Current Every Day Smoker    Packs/day: 0.00    Years: 20.00    Types: Cigarettes    Last attempt to quit: 03/15/2015  . Smokeless tobacco: Never Used     Comment: 2 cigarettes/day  . Alcohol use No  . Drug use: No  . Sexual activity: Yes   Tobacco Counseling Ready to quit: Yes Counseling given: Yes   Activities of Daily Living In your present state of health, do you have any difficulty performing the following activities: 12/02/2016  Hearing? N  Vision? N  Difficulty concentrating or making decisions? N  Walking or climbing  stairs? Y  Dressing or bathing? Y  Doing errands, shopping? N  Preparing Food and eating ? N  Using the Toilet? N  In the past six months, have you accidently leaked urine? Y  Do you have problems with loss of bowel control? N  Managing your Medications? N  Managing your Finances? Y  Housekeeping or managing your Housekeeping? N  Some recent data might be hidden    Immunizations and Health Maintenance Immunization History  Administered Date(s) Administered  . Influenza Split 09/26/2011, 09/19/2012  . Influenza,inj,Quad PF,36+ Mos 09/25/2013, 08/19/2014, 09/09/2015, 10/11/2016  . Pneumococcal Conjugate-13 12/26/2008  . Pneumococcal Polysaccharide-23 11/09/2016  . Tdap 05/02/2014  There are no preventive care reminders to display for this patient.  Patient Care Team: [name], NP as PCP - General (Internal Medicine) Donato Heinz, MD as Consulting Physician (Nephrology) Dorna Leitz, MD as Consulting Physician (Orthopedic Surgery)  Indicate any recent Medical Services you may have received from other than [facility] providers in the past year (date may be approximate).    Assessment:   This is a routine wellness examination for Kyle Blair. Physical assessment deferred to PCP.  Hearing/Vision screen  Visual Acuity Screening   Right eye Left eye Both eyes  Without correction:     With correction: 20/30 20/40 20/25   Comments: Wears glasses. Current glasses rx x3 years.  Hearing Screening Comments: Able to hear conversational tones w/o difficulty. No issues reported. Passes whisper test.  Dietary issues and exercise activities discussed: Current Exercise Habits: The patient has a physically strenous job, but has no regular exercise apart from work., Intensity: Mild, Exercise limited by: orthopedic condition(s)   Diet (meal preparation, eat out, water intake, caffeinated beverages, dairy products, fruits and vegetables): on average, 2 meals per day. Does not like to cook.  Wife does most of the cooking. Eats lots of spaghetti, lasagna, hamburgers, steak. Likes beets, greens beans, corn, spinach. Cutting back on sweet tea and increasing water intake. 2 cups coffee daily.  Goals    . Prevent Falls          Falls r/t decreased mobility of R knee. Following w/ Dr. Berenice Primas for possible repeat knee replacement.    . Quit smoking / using tobacco      Depression Screen PHQ 2/9 Scores 12/02/2016 10/11/2016  PHQ - 2 Score 0 0    Fall Risk Fall Risk  12/02/2016  Falls in the past year? Yes  Number falls in past yr: 2 or more  Risk Factor Category  High Fall Risk  Risk for fall due to : History of fall(s);Impaired balance/gait;Impaired mobility  Follow up Falls evaluation completed;Falls prevention discussed    Cognitive Function: MMSE - Mini Mental State Exam 12/02/2016  Orientation to time 5  Orientation to Place 5  Registration 3  Attention/ Calculation 3  Recall 3  Language- name 2 objects 2  Language- repeat 1  Language- follow 3 step command 3  Language- read & follow direction 1  Write a sentence 1  Copy design 1  Total score 28        Screening Tests Health Maintenance  Topic Date Due  . Hepatitis C Screening  04/12/2017 (Originally 03/05/1961)  . HIV Screening  10/11/2017 (Originally 12/25/1976)  . COLONOSCOPY  10/06/2017  . TETANUS/TDAP  05/02/2024  . INFLUENZA VACCINE  Completed        Plan:   Follow-up w/ [name], NP as scheduled. Bring a copy of your advance directives to your next office visit. Try to remember to use your cane. Continue smoking cessation efforts. Dental Resources list provided.  During the course of the visit Kyle Blair was educated and counseled about the following appropriate screening and preventive services:   Vaccines to include Pneumoccal, Influenza, Hepatitis B, Td, Zostavax, HCV  Colorectal cancer screening  Cardiovascular disease screening  Diabetes screening  Glaucoma screening    Nutrition counseling  Smoking cessation counseling  Patient Instructions (the written plan) were given to the patient.   Dorrene German, RN   12/02/2016

## 2016-12-05 NOTE — Progress Notes (Signed)
Noted and agree. 

## 2017-01-16 ENCOUNTER — Other Ambulatory Visit: Payer: Self-pay | Admitting: Family

## 2017-01-17 ENCOUNTER — Emergency Department (HOSPITAL_BASED_OUTPATIENT_CLINIC_OR_DEPARTMENT_OTHER): Payer: Medicare Other

## 2017-01-17 ENCOUNTER — Telehealth: Payer: Self-pay | Admitting: Family

## 2017-01-17 ENCOUNTER — Encounter (HOSPITAL_BASED_OUTPATIENT_CLINIC_OR_DEPARTMENT_OTHER): Payer: Self-pay

## 2017-01-17 DIAGNOSIS — E039 Hypothyroidism, unspecified: Secondary | ICD-10-CM | POA: Diagnosis present

## 2017-01-17 DIAGNOSIS — Z888 Allergy status to other drugs, medicaments and biological substances status: Secondary | ICD-10-CM

## 2017-01-17 DIAGNOSIS — M1990 Unspecified osteoarthritis, unspecified site: Secondary | ICD-10-CM | POA: Diagnosis present

## 2017-01-17 DIAGNOSIS — Z886 Allergy status to analgesic agent status: Secondary | ICD-10-CM

## 2017-01-17 DIAGNOSIS — R05 Cough: Secondary | ICD-10-CM | POA: Diagnosis not present

## 2017-01-17 DIAGNOSIS — J9601 Acute respiratory failure with hypoxia: Principal | ICD-10-CM | POA: Diagnosis present

## 2017-01-17 DIAGNOSIS — J439 Emphysema, unspecified: Secondary | ICD-10-CM | POA: Diagnosis present

## 2017-01-17 DIAGNOSIS — R0989 Other specified symptoms and signs involving the circulatory and respiratory systems: Secondary | ICD-10-CM | POA: Diagnosis present

## 2017-01-17 DIAGNOSIS — Z91010 Allergy to peanuts: Secondary | ICD-10-CM

## 2017-01-17 DIAGNOSIS — Z6833 Body mass index (BMI) 33.0-33.9, adult: Secondary | ICD-10-CM

## 2017-01-17 DIAGNOSIS — E785 Hyperlipidemia, unspecified: Secondary | ICD-10-CM | POA: Diagnosis present

## 2017-01-17 DIAGNOSIS — Z96651 Presence of right artificial knee joint: Secondary | ICD-10-CM | POA: Diagnosis present

## 2017-01-17 DIAGNOSIS — J1000 Influenza due to other identified influenza virus with unspecified type of pneumonia: Secondary | ICD-10-CM | POA: Diagnosis present

## 2017-01-17 DIAGNOSIS — E669 Obesity, unspecified: Secondary | ICD-10-CM | POA: Diagnosis present

## 2017-01-17 DIAGNOSIS — K219 Gastro-esophageal reflux disease without esophagitis: Secondary | ICD-10-CM | POA: Diagnosis present

## 2017-01-17 DIAGNOSIS — I119 Hypertensive heart disease without heart failure: Secondary | ICD-10-CM | POA: Diagnosis present

## 2017-01-17 DIAGNOSIS — Z881 Allergy status to other antibiotic agents status: Secondary | ICD-10-CM

## 2017-01-17 DIAGNOSIS — N400 Enlarged prostate without lower urinary tract symptoms: Secondary | ICD-10-CM | POA: Diagnosis present

## 2017-01-17 DIAGNOSIS — F17210 Nicotine dependence, cigarettes, uncomplicated: Secondary | ICD-10-CM | POA: Diagnosis present

## 2017-01-17 DIAGNOSIS — J45901 Unspecified asthma with (acute) exacerbation: Secondary | ICD-10-CM | POA: Diagnosis present

## 2017-01-17 NOTE — ED Triage Notes (Signed)
C/o prod cough x 3 days-last albuterol tx approx 930pm-NAD-slow steady gait

## 2017-01-17 NOTE — Telephone Encounter (Signed)
Caller name:Sunderlin,Michelle Relation to [phone]  Call back number:[phone] Pharmacy: [facility] [place] - [address] [phone] (Phone) [phone] (Fax)     Reason for call:  Patient experiencing coughing x2 and coughing up mucus requesting prednisone and zpack, please advise

## 2017-01-17 NOTE — Telephone Encounter (Signed)
Advised pt's spouse that pt needs to be seen for evaluation prior to medications being prescribed. She voices understanding and has appt with PCP at 10:45 tomorrow and wants to bring spouse with her for evaluation. There is a block in PCP schedule at 11am tomorrow. I advised pt's spouse to bring pt with her to her appt and I will let her know if we are not able to work him in. Is it ok to bring pt in at that time?

## 2017-01-17 NOTE — Telephone Encounter (Signed)
Ok

## 2017-01-18 ENCOUNTER — Encounter (HOSPITAL_BASED_OUTPATIENT_CLINIC_OR_DEPARTMENT_OTHER): Payer: Self-pay | Admitting: Emergency Medicine

## 2017-01-18 ENCOUNTER — Inpatient Hospital Stay (HOSPITAL_BASED_OUTPATIENT_CLINIC_OR_DEPARTMENT_OTHER)
Admission: EM | Admit: 2017-01-18 | Discharge: 2017-01-21 | DRG: 189 | Disposition: A | Discharge status: Home / Self Care | Payer: Medicare Other | Attending: Internal Medicine | Admitting: Internal Medicine

## 2017-01-18 ENCOUNTER — Ambulatory Visit: Payer: Medicare Other | Admitting: Family

## 2017-01-18 DIAGNOSIS — J45901 Unspecified asthma with (acute) exacerbation: Secondary | ICD-10-CM | POA: Diagnosis present

## 2017-01-18 DIAGNOSIS — N400 Enlarged prostate without lower urinary tract symptoms: Secondary | ICD-10-CM | POA: Diagnosis present

## 2017-01-18 DIAGNOSIS — J189 Pneumonia, unspecified organism: Secondary | ICD-10-CM | POA: Diagnosis present

## 2017-01-18 DIAGNOSIS — J9600 Acute respiratory failure, unspecified whether with hypoxia or hypercapnia: Secondary | ICD-10-CM | POA: Diagnosis not present

## 2017-01-18 DIAGNOSIS — J439 Emphysema, unspecified: Secondary | ICD-10-CM | POA: Diagnosis present

## 2017-01-18 DIAGNOSIS — Z91010 Allergy to peanuts: Secondary | ICD-10-CM | POA: Diagnosis not present

## 2017-01-18 DIAGNOSIS — M1990 Unspecified osteoarthritis, unspecified site: Secondary | ICD-10-CM | POA: Diagnosis present

## 2017-01-18 DIAGNOSIS — J9601 Acute respiratory failure with hypoxia: Secondary | ICD-10-CM | POA: Diagnosis present

## 2017-01-18 DIAGNOSIS — R0989 Other specified symptoms and signs involving the circulatory and respiratory systems: Secondary | ICD-10-CM | POA: Diagnosis present

## 2017-01-18 DIAGNOSIS — Z888 Allergy status to other drugs, medicaments and biological substances status: Secondary | ICD-10-CM | POA: Diagnosis not present

## 2017-01-18 DIAGNOSIS — J1000 Influenza due to other identified influenza virus with unspecified type of pneumonia: Secondary | ICD-10-CM | POA: Diagnosis present

## 2017-01-18 DIAGNOSIS — Z6833 Body mass index (BMI) 33.0-33.9, adult: Secondary | ICD-10-CM | POA: Diagnosis not present

## 2017-01-18 DIAGNOSIS — I10 Essential (primary) hypertension: Secondary | ICD-10-CM | POA: Diagnosis not present

## 2017-01-18 DIAGNOSIS — J1100 Influenza due to unidentified influenza virus with unspecified type of pneumonia: Secondary | ICD-10-CM | POA: Diagnosis present

## 2017-01-18 DIAGNOSIS — J45909 Unspecified asthma, uncomplicated: Secondary | ICD-10-CM | POA: Diagnosis present

## 2017-01-18 DIAGNOSIS — E039 Hypothyroidism, unspecified: Secondary | ICD-10-CM | POA: Diagnosis present

## 2017-01-18 DIAGNOSIS — Z881 Allergy status to other antibiotic agents status: Secondary | ICD-10-CM | POA: Diagnosis not present

## 2017-01-18 DIAGNOSIS — E669 Obesity, unspecified: Secondary | ICD-10-CM | POA: Diagnosis present

## 2017-01-18 DIAGNOSIS — I119 Hypertensive heart disease without heart failure: Secondary | ICD-10-CM | POA: Diagnosis present

## 2017-01-18 DIAGNOSIS — Z96651 Presence of right artificial knee joint: Secondary | ICD-10-CM | POA: Diagnosis present

## 2017-01-18 DIAGNOSIS — K219 Gastro-esophageal reflux disease without esophagitis: Secondary | ICD-10-CM | POA: Diagnosis present

## 2017-01-18 DIAGNOSIS — R0602 Shortness of breath: Secondary | ICD-10-CM

## 2017-01-18 DIAGNOSIS — Z886 Allergy status to analgesic agent status: Secondary | ICD-10-CM | POA: Diagnosis not present

## 2017-01-18 DIAGNOSIS — R05 Cough: Secondary | ICD-10-CM | POA: Diagnosis present

## 2017-01-18 DIAGNOSIS — E785 Hyperlipidemia, unspecified: Secondary | ICD-10-CM | POA: Diagnosis present

## 2017-01-18 DIAGNOSIS — F17210 Nicotine dependence, cigarettes, uncomplicated: Secondary | ICD-10-CM | POA: Diagnosis present

## 2017-01-18 DIAGNOSIS — Z8739 Personal history of other diseases of the musculoskeletal system and connective tissue: Secondary | ICD-10-CM

## 2017-01-18 HISTORY — DX: Pneumonia, unspecified organism: J18.9

## 2017-01-18 LAB — CBC WITH DIFFERENTIAL/PLATELET
BASOS ABS: 0 10*3/uL (ref 0.0–0.1)
BASOS PCT: 0 %
Eosinophils Absolute: 0 10*3/uL (ref 0.0–0.7)
Eosinophils Relative: 0 %
HEMATOCRIT: 42.3 % (ref 39.0–52.0)
Hemoglobin: 14.7 g/dL (ref 13.0–17.0)
LYMPHS PCT: 8 %
Lymphs Abs: 0.3 10*3/uL — ABNORMAL LOW (ref 0.7–4.0)
MCH: 30.2 pg (ref 26.0–34.0)
MCHC: 34.8 g/dL (ref 30.0–36.0)
MCV: 87 fL (ref 78.0–100.0)
Monocytes Absolute: 0.7 10*3/uL (ref 0.1–1.0)
Monocytes Relative: 16 %
NEUTROS ABS: 3.1 10*3/uL (ref 1.7–7.7)
Neutrophils Relative %: 76 %
PLATELETS: 133 10*3/uL — AB (ref 150–400)
RBC: 4.86 MIL/uL (ref 4.22–5.81)
RDW: 13.1 % (ref 11.5–15.5)
WBC: 4.1 10*3/uL (ref 4.0–10.5)

## 2017-01-18 LAB — CBC
HEMATOCRIT: 41.7 % (ref 39.0–52.0)
HEMOGLOBIN: 14.3 g/dL (ref 13.0–17.0)
MCH: 29.4 pg (ref 26.0–34.0)
MCHC: 34.3 g/dL (ref 30.0–36.0)
MCV: 85.8 fL (ref 78.0–100.0)
Platelets: 121 10*3/uL — ABNORMAL LOW (ref 150–400)
RBC: 4.86 MIL/uL (ref 4.22–5.81)
RDW: 13.2 % (ref 11.5–15.5)
WBC: 3.5 10*3/uL — AB (ref 4.0–10.5)

## 2017-01-18 LAB — COMPREHENSIVE METABOLIC PANEL
ALT: 12 U/L — AB (ref 17–63)
AST: 13 U/L — AB (ref 15–41)
Albumin: 4.3 g/dL (ref 3.5–5.0)
Alkaline Phosphatase: 72 U/L (ref 38–126)
Anion gap: 7 (ref 5–15)
BUN: 7 mg/dL (ref 6–20)
CHLORIDE: 98 mmol/L — AB (ref 101–111)
CO2: 28 mmol/L (ref 22–32)
CREATININE: 0.84 mg/dL (ref 0.61–1.24)
Calcium: 9.1 mg/dL (ref 8.9–10.3)
GFR calc Af Amer: >60 mL/min (ref >60)
Glucose, Bld: 107 mg/dL — ABNORMAL HIGH (ref 65–99)
POTASSIUM: 4.2 mmol/L (ref 3.5–5.1)
Sodium: 133 mmol/L — ABNORMAL LOW (ref 135–145)
Total Bilirubin: 0.4 mg/dL (ref 0.3–1.2)
Total Protein: 7 g/dL (ref 6.5–8.1)

## 2017-01-18 LAB — CREATININE, SERUM
Creatinine, Ser: 0.88 mg/dL (ref 0.61–1.24)
GFR calc non Af Amer: >60 mL/min (ref >60)

## 2017-01-18 LAB — I-STAT ARTERIAL BLOOD GAS, ED
ACID-BASE EXCESS: 1 mmol/L (ref 0.0–2.0)
BICARBONATE: 26.4 mmol/L (ref 20.0–28.0)
O2 SAT: 89 %
PCO2 ART: 44.9 mmHg (ref 32.0–48.0)
PH ART: 7.379 (ref 7.350–7.450)
PO2 ART: 58 mmHg — AB (ref 83.0–108.0)
Patient temperature: 99.2
TCO2: 28 mmol/L (ref 0–100)

## 2017-01-18 LAB — STREP PNEUMONIAE URINARY ANTIGEN: Strep Pneumo Urinary Antigen: NEGATIVE

## 2017-01-18 MED ORDER — ALBUTEROL SULFATE (2.5 MG/3ML) 0.083% IN NEBU
5.0000 mg | INHALATION_SOLUTION | Freq: Once | RESPIRATORY_TRACT | Status: AC
  Administered 2017-01-18: 5 mg via RESPIRATORY_TRACT
  Filled 2017-01-18: qty 6

## 2017-01-18 MED ORDER — TAMSULOSIN HCL 0.4 MG PO CAPS
0.4000 mg | ORAL_CAPSULE | Freq: Every day | ORAL | Status: DC
  Administered 2017-01-18: 0.4 mg via ORAL
  Filled 2017-01-18: qty 1

## 2017-01-18 MED ORDER — GABAPENTIN 300 MG PO CAPS
300.0000 mg | ORAL_CAPSULE | Freq: Four times a day (QID) | ORAL | Status: DC
  Administered 2017-01-18 – 2017-01-21 (×12): 300 mg via ORAL
  Filled 2017-01-18 (×13): qty 1

## 2017-01-18 MED ORDER — PRAVASTATIN SODIUM 40 MG PO TABS
40.0000 mg | ORAL_TABLET | Freq: Every day | ORAL | Status: DC
  Administered 2017-01-18 – 2017-01-20 (×3): 40 mg via ORAL
  Filled 2017-01-18 (×3): qty 1

## 2017-01-18 MED ORDER — OXYCODONE HCL 5 MG PO TABS
15.0000 mg | ORAL_TABLET | Freq: Once | ORAL | Status: AC
  Administered 2017-01-18: 15 mg via ORAL
  Filled 2017-01-18: qty 3

## 2017-01-18 MED ORDER — METHYLPREDNISOLONE SODIUM SUCC 125 MG IJ SOLR
125.0000 mg | Freq: Once | INTRAMUSCULAR | Status: AC
  Administered 2017-01-18: 125 mg via INTRAVENOUS
  Filled 2017-01-18: qty 2

## 2017-01-18 MED ORDER — IPRATROPIUM BROMIDE 0.02 % IN SOLN
0.5000 mg | Freq: Once | RESPIRATORY_TRACT | Status: AC
  Administered 2017-01-18: 0.5 mg via RESPIRATORY_TRACT
  Filled 2017-01-18: qty 2.5

## 2017-01-18 MED ORDER — OXYCODONE HCL 5 MG PO TABS
10.0000 mg | ORAL_TABLET | Freq: Three times a day (TID) | ORAL | Status: DC | PRN
  Administered 2017-01-19 – 2017-01-21 (×3): 10 mg via ORAL
  Filled 2017-01-18 (×4): qty 2

## 2017-01-18 MED ORDER — LEVOFLOXACIN 750 MG PO TABS
750.0000 mg | ORAL_TABLET | ORAL | Status: DC
  Administered 2017-01-18 – 2017-01-20 (×3): 750 mg via ORAL
  Filled 2017-01-18 (×3): qty 1

## 2017-01-18 MED ORDER — LEVOTHYROXINE SODIUM 100 MCG PO TABS
200.0000 ug | ORAL_TABLET | Freq: Every day | ORAL | Status: DC
  Administered 2017-01-18 – 2017-01-21 (×4): 200 ug via ORAL
  Filled 2017-01-18 (×4): qty 2

## 2017-01-18 MED ORDER — ALBUTEROL SULFATE (2.5 MG/3ML) 0.083% IN NEBU: 5.0000 mg | INHALATION_SOLUTION | RESPIRATORY_TRACT | Status: DC | PRN

## 2017-01-18 MED ORDER — TAMSULOSIN HCL 0.4 MG PO CAPS
0.4000 mg | ORAL_CAPSULE | Freq: Every day | ORAL | Status: DC
  Administered 2017-01-19 – 2017-01-21 (×3): 0.4 mg via ORAL
  Filled 2017-01-18 (×3): qty 1

## 2017-01-18 MED ORDER — AMLODIPINE BESYLATE 5 MG PO TABS
5.0000 mg | ORAL_TABLET | Freq: Every day | ORAL | Status: DC
  Administered 2017-01-18 – 2017-01-21 (×4): 5 mg via ORAL
  Filled 2017-01-18 (×4): qty 1

## 2017-01-18 MED ORDER — ALBUTEROL SULFATE (2.5 MG/3ML) 0.083% IN NEBU
5.0000 mg | INHALATION_SOLUTION | Freq: Once | RESPIRATORY_TRACT | Status: AC
  Administered 2017-01-18: 5 mg via RESPIRATORY_TRACT

## 2017-01-18 MED ORDER — IPRATROPIUM-ALBUTEROL 0.5-2.5 (3) MG/3ML IN SOLN
3.0000 mL | Freq: Four times a day (QID) | RESPIRATORY_TRACT | Status: DC
  Administered 2017-01-18 – 2017-01-19 (×6): 3 mL via RESPIRATORY_TRACT
  Filled 2017-01-18 (×6): qty 3

## 2017-01-18 MED ORDER — FUROSEMIDE 10 MG/ML IJ SOLN
20.0000 mg | Freq: Once | INTRAMUSCULAR | Status: AC
  Administered 2017-01-18: 20 mg via INTRAVENOUS
  Filled 2017-01-18: qty 2

## 2017-01-18 MED ORDER — MOMETASONE FURO-FORMOTEROL FUM 200-5 MCG/ACT IN AERO
2.0000 | INHALATION_SPRAY | Freq: Two times a day (BID) | RESPIRATORY_TRACT | Status: DC
  Administered 2017-01-18 – 2017-01-21 (×6): 2 via RESPIRATORY_TRACT
  Filled 2017-01-18: qty 8.8

## 2017-01-18 MED ORDER — OSELTAMIVIR PHOSPHATE 75 MG PO CAPS
75.0000 mg | ORAL_CAPSULE | Freq: Two times a day (BID) | ORAL | Status: DC
  Administered 2017-01-18 – 2017-01-21 (×7): 75 mg via ORAL
  Filled 2017-01-18 (×7): qty 1

## 2017-01-18 MED ORDER — METHOCARBAMOL 500 MG PO TABS
500.0000 mg | ORAL_TABLET | Freq: Four times a day (QID) | ORAL | Status: DC | PRN
  Administered 2017-01-19 (×2): 500 mg via ORAL
  Filled 2017-01-18 (×2): qty 1

## 2017-01-18 MED ORDER — OXYCODONE HCL ER 20 MG PO T12A
30.0000 mg | EXTENDED_RELEASE_TABLET | Freq: Two times a day (BID) | ORAL | Status: DC
  Administered 2017-01-18 – 2017-01-21 (×6): 30 mg via ORAL
  Filled 2017-01-18 (×6): qty 1

## 2017-01-18 MED ORDER — ALBUTEROL SULFATE (2.5 MG/3ML) 0.083% IN NEBU
INHALATION_SOLUTION | RESPIRATORY_TRACT | Status: AC
  Administered 2017-01-18: 5 mg via RESPIRATORY_TRACT
  Filled 2017-01-18: qty 6

## 2017-01-18 MED ORDER — LEVOFLOXACIN IN D5W 750 MG/150ML IV SOLN
750.0000 mg | Freq: Once | INTRAVENOUS | Status: AC
  Administered 2017-01-18: 750 mg via INTRAVENOUS
  Filled 2017-01-18: qty 150

## 2017-01-18 MED ORDER — NICOTINE 21 MG/24HR TD PT24: 21.0000 mg | MEDICATED_PATCH | Freq: Every day | TRANSDERMAL | Status: DC | PRN

## 2017-01-18 MED ORDER — ENOXAPARIN SODIUM 40 MG/0.4ML ~~LOC~~ SOLN
40.0000 mg | SUBCUTANEOUS | Status: DC
  Administered 2017-01-18: 40 mg via SUBCUTANEOUS
  Filled 2017-01-18 (×3): qty 0.4

## 2017-01-18 MED ORDER — PREDNISONE 20 MG PO TABS
40.0000 mg | ORAL_TABLET | Freq: Every day | ORAL | Status: DC
  Administered 2017-01-19 – 2017-01-21 (×3): 40 mg via ORAL
  Filled 2017-01-18 (×3): qty 2

## 2017-01-18 MED ORDER — MONTELUKAST SODIUM 10 MG PO TABS
10.0000 mg | ORAL_TABLET | Freq: Every day | ORAL | Status: DC
  Administered 2017-01-18 – 2017-01-21 (×4): 10 mg via ORAL
  Filled 2017-01-18 (×5): qty 1

## 2017-01-18 MED ORDER — PANTOPRAZOLE SODIUM 40 MG PO TBEC
40.0000 mg | DELAYED_RELEASE_TABLET | Freq: Every day | ORAL | Status: DC
  Administered 2017-01-18 – 2017-01-21 (×4): 40 mg via ORAL
  Filled 2017-01-18 (×4): qty 1

## 2017-01-18 MED ORDER — ACETAMINOPHEN 325 MG PO TABS
325.0000 mg | ORAL_TABLET | Freq: Four times a day (QID) | ORAL | Status: DC | PRN
Start: 2017-01-18 — End: 2017-01-21

## 2017-01-18 NOTE — H&P (Signed)
History and Physical   Kyle Blair [id number] DOB: 11/15/1961 DOA: 01/18/2017  Referring MD/NP/PA: Dr. Canary Brim, EDP PCP: Nance Pear., NP  Patient coming from: Home by way of transfer from [facility]  Chief Complaint: Cough  HPI: Kyle Blair is a 56 y.o. male with a history of emphysema who presented to the ED 1/23 for 2 days of worsening, severe, productive cough. Cough has been associated with abrupt onset fever, myalgias, fatigue, and dyspnea. Cough is constant, not appreciably improved with OTC treatments, not worse with exertion. Denies chest pain, palpitations, leg swelling.    ED Course: In the ED, he had temperature of 99.51F and saturations below 90% on room air requiring supplemental oxygen per EDP report. CXR showed cardiomegaly and vascular congestion as well as L>R bibasilar airspace opacities. Levaquin was started for pneumonia, breathing treatments were provided with improvement in dyspnea, cough, and wheezing. TRH called for admission due to continued hypoxemia.   Review of Systems: Per HPI. All other systems reviewed and are negative.   Past Medical History:  Diagnosis Date  . Arthritis   . Asthma   . BPH (benign prostatic hypertrophy)   . GERD with stricture   . History of stroke 2004  . Hyperlipidemia   . Hypertension   . Hypothyroid   . Lung nodule 02/03/2012  . Polycystic kidney disease   . Stroke [facility])     Past Surgical History:  Procedure Laterality Date  . CARPAL TUNNEL RELEASE    . COLONOSCOPY    . HERNIA REPAIR  2005   with mesh  . KNEE ARTHROSCOPY Bilateral 12/05/13   cyst and bone spur removed from left knee per pt.  Marland Kitchen KNEE ARTHROSCOPY Left 01/2015  . NASAL POLYP EXCISION  1992 or 93  . POLYPECTOMY    . TOTAL KNEE ARTHROPLASTY Right 05/19/2015   Procedure: RIGHT TOTAL KNEE ARTHROPLASTY;  Surgeon: Paralee Cancel, MD;  Location: [facility];  Service: Orthopedics;  Laterality: Right;    - Current smoker (~5 cigarettes per day), no EtOH or  illicit substances.   Allergies  Allergen Reactions  . Aspirin Anaphylaxis  . Doxycycline Hyclate Swelling    Facial Swelling  . Ibuprofen Anaphylaxis  . Peanut Butter Flavor Anaphylaxis  . Cephalexin Rash    Family History  Problem Relation Age of Onset  . Heart disease Mother     pacemaker  . Diabetes Father   . Kidney disease Father   . Diabetes Sister   . Kidney disease Sister   . Colon cancer Neg Hx   . Esophageal cancer Neg Hx   . Rectal cancer Neg Hx   . Stomach cancer Neg Hx   . Prostate cancer Neg Hx   . Pancreatic cancer Neg Hx    - Family history otherwise reviewed and not pertinent.  Prior to Admission medications   Medication Sig Start Date End Date Taking? Authorizing Provider  acetaminophen (TYLENOL) 325 MG tablet Take 1-2 tablets (325-650 mg total) by mouth every 6 (six) hours as needed. 05/19/15   Danae Orleans, PA-C  ADVAIR DISKUS 250-50 MCG/DOSE AEPB INHALE 1 PUFF INTO THE LUNGS TWICE DAILY 01/16/17   [name], NP  amLODipine (NORVASC) 5 MG tablet TAKE ONE (1) TABLET EACH DAY 10/13/16   [name], NP  benazepril (LOTENSIN) 10 MG tablet TAKE 1 TABLET(10 MG) BY MOUTH DAILY 09/14/15   [name], NP  diphenhydrAMINE (BENADRYL) 25 MG tablet Take 25 mg by mouth at bedtime as needed for sleep.  Historical Provider, MD  docusate sodium (COLACE) 100 MG capsule Take 1 capsule (100 mg total) by mouth daily. Patient taking differently: Take 100 mg by mouth daily as needed.  09/02/15   [name], NP  furosemide (LASIX) 20 MG tablet Take 1 tablet (20 mg total) by mouth 2 (two) times daily. Patient taking differently: Take 20 mg by mouth daily as needed.  04/12/16   [name], NP  gabapentin (NEURONTIN) 300 MG capsule Take 300 mg by mouth 4 (four) times daily.  09/18/15   Historical Provider, MD  levothyroxine (SYNTHROID) 200 MCG tablet Take 1 tablet (200 mcg total) by mouth daily before breakfast. 11/09/16   [name],  NP  lovastatin (MEVACOR) 40 MG tablet TAKE ONE (1) TABLET AT BEDTIME 10/13/16   [name], NP  methocarbamol (ROBAXIN) 500 MG tablet Take 1 tablet (500 mg total) by mouth every 6 (six) hours as needed for muscle spasms. 05/19/15   Danae Orleans, PA-C  montelukast (SINGULAIR) 10 MG tablet TAKE ONE TABLET DAILY IN THE MORNING 08/01/16   [name], NP  omeprazole (PRILOSEC) 40 MG capsule TAKE ONE CAPSULE BY MOUTH DAILY 12/01/16   [name], NP  oxyCODONE (OXYCONTIN) 40 mg 12 hr tablet Take 1 tablet by mouth 2 (two) times daily. 03/31/16   Historical Provider, MD  oxyCODONE (ROXICODONE) 15 MG immediate release tablet Take 15 mg by mouth every 4 (four) hours as needed for pain.    Historical Provider, MD  tamsulosin (FLOMAX) 0.4 MG CAPS capsule TAKE ONE CAPSULE BY MOUTH EVERY NIGHT AT BEDTIME Patient taking differently: TAKE ONE CAPSULE BY MOUTH EVERY DAY 12/01/16   [name], NP  varenicline (CHANTIX CONTINUING MONTH PAK) 1 MG tablet Take 1 tablet (1 mg total) by mouth 2 (two) times daily. 11/09/16   [name], NP  VENTOLIN HFA 108 (90 Base) MCG/ACT inhaler INHALE 2 PUFFS INTO THE LUNGS EVERY 4 HOURS AS NEEDED FOR WHEEZING ORSHORTNESS OF BREATH 01/16/17   [name], NP    Physical Exam: Vitals:   01/18/17 0800 01/18/17 0900 01/18/17 1000 01/18/17 1019  BP: 108/73 119/74 114/71   Pulse: 83 77 79   Resp: 16 14 12    Temp:      TempSrc:      SpO2: 92% 93% 94% 92%  Weight:      Height:       Constitutional: 56 y.o. male in no distress, calm demeanor Eyes: Lids and conjunctivae normal, PERRL ENMT: Mucous membranes are moist. Posterior pharynx clear of any exudate or lesions. Turbinates boggy and voice sounds like nasal congestion. Fair dentition.  Neck: normal, supple, no masses, no thyromegaly Respiratory: Mildly labored breathing 2L by nasal cannula with scattered end-expiratory wheezing and prolongation of expiration. Cardiovascular: Regular rate  and rhythm, no murmurs, rubs, or gallops. No carotid bruits. No JVD. No LE edema. 2+ pedal pulses. Abdomen: Normoactive bowel sounds. No tenderness, non-distended, and no masses palpated. No hepatosplenomegaly. GU: No indwelling catheter Musculoskeletal: No clubbing / cyanosis. No joint deformity upper and lower extremities. Good ROM, no contractures. Normal muscle tone.  Skin: Warm, dry. No rashes, wounds, no ulcers. No significant lesions noted.  Neurologic: CN II-XII grossly intact. Gait narrow-based. Speech normal. No focal deficits in motor strength or sensation in all extremities.  Psychiatric: Alert and oriented x3. Normal judgment and insight. Mood euthymic with congruent affect.   Labs on Admission: I have personally reviewed following labs and imaging studies  CBC:  Recent Labs Lab 01/18/17 0038  WBC 4.1  NEUTROABS 3.1  HGB 14.7  HCT 42.3  MCV 87.0  PLT [id number]*   Basic Metabolic Panel:  Recent Labs Lab 01/18/17 0038  NA 133*  K 4.2  CL 98*  CO2 28  GLUCOSE 107*  BUN 7  CREATININE 0.84  CALCIUM 9.1   GFR: Estimated Creatinine Clearance: 107.4 mL/min (by C-G formula based on SCr of 0.84 mg/dL). Liver Function Tests:  Recent Labs Lab 01/18/17 0038  AST 13*  ALT 12*  ALKPHOS 72  BILITOT 0.4  PROT 7.0  ALBUMIN 4.3   Urine analysis:    Component Value Date/Time   COLORURINE YELLOW 05/14/2015 1015   APPEARANCEUR CLEAR 05/14/2015 1015   LABSPEC 1.013 05/14/2015 1015   PHURINE 7.0 05/14/2015 1015   GLUCOSEU NEGATIVE 05/14/2015 1015   GLUCOSEU NEGATIVE 04/15/2015 0945   HGBUR NEGATIVE 05/14/2015 1015   BILIRUBINUR NEGATIVE 05/14/2015 1015   KETONESUR NEGATIVE 05/14/2015 1015   PROTEINUR NEGATIVE 05/14/2015 1015   UROBILINOGEN 0.2 05/14/2015 1015   NITRITE NEGATIVE 05/14/2015 1015   LEUKOCYTESUR NEGATIVE 05/14/2015 1015   Radiological Exams on Admission: Dg Chest 2 View  Result Date: 01/17/2017 CLINICAL DATA:  Productive cough.  Fever with lethargy.  EXAM: CHEST  2 VIEW COMPARISON:  05/24/2016 and 04/12/2016. FINDINGS: Two views study shows low volumes. Cardiopericardial silhouette is at upper limits of normal for size. There is pulmonary vascular congestion without overt pulmonary edema. Bilateral hilar fullness is similar to prior studies. There is bibasilar airspace disease on the current study, left greater than right. IMPRESSION: 1. Cardiomegaly with vascular congestion. 2. Bibasilar airspace disease, left greater than right. This may be atelectatic but pneumonia cannot be excluded. 3. Bilateral hilar prominence, similar to previous studies and likely further accentuated on today's study secondary to the low lung volumes. Electronically Signed   By: Misty Stanley M.D.   On: 01/17/2017 22:58   EKG: Not performed  Assessment/Plan Principal Problem:   Community acquired pneumonia Active Problems:   Hypothyroid   Asthma   Benign prostatic hyperplasia   Hyperlipidemia   GERD (gastroesophageal reflux disease)   Hx of low back pain   HTN (hypertension)   Acute hypoxemic respiratory failure: pO2 58 on ABG. Primarily due to CAP causing asthma exacerbation.  - Supplemental oxygen as needed for hypoxemia; goal SpO2: >90% - Treat as below  Acute asthma exacerbations due to community-acquired pneumonia: With cough and fever PTA. No objective SIRS at admission. CXR: bibasilar airspace disease.  - Check RVP. Start tamiflu and droplet precautions empirically pending results. - Urine pneumococcus, legionella antigens - Blood and sputum culture (1/24 - ) - HIV antibody - Received levaquin in ED, will continue this. Cephalosporin allergy noted.  - Start steroids as he sounds tight with prolonged expiration and wheezing.  - Continue advair BID - Duonebs q6h / Albuterol q3h prn - Incentive spirometry  Vascular congestion: Current wt 210lbs (217lbs at last PCP visit 11/15). Last echo 2016 with mild LVH, EF 60-65%, normal wall motion, G1DD. Normal  RV size/function.  - Continue lasix 20mg  po BID  HTN: Normotensive in ED. - Continue norvasc 5mg  and benazepril 10mg  daily  Tobacco use:  - Nicotine patch - Brief cessation counseling  Chronic pain: NCCSRS reviewed, no red flags. Last fill for oxycontin 30mg  and oxycodone 10mg  on 1/22.  - Restart home doses of oxycontin and oxycodone.  - Continue neurontin 300mg  QID  Hypothyroidism: Last TSH Nov 2017 was 3.00. - Continue home synthroid  BPH: Chronic, stable -  Continue flomax  DVT prophylaxis: Lovenox  Code Status: Full  Family Communication: None at bedside Disposition Plan: Anticipate DC back home once hypoxemia resolved  Consults called: None  Admission status: Inpatient, as patient requires supplemental oxygen and is expected to require treatment through 1/25 (start of therapy 1/23).   Vance Gather, MD [facility] Pager [phone]  If 7PM-7AM, please contact night-coverage [email or web address] Password [email or web address] 01/18/2017, 10:26 AM

## 2017-01-18 NOTE — ED Notes (Signed)
carelink called to page hospitalist

## 2017-01-18 NOTE — Telephone Encounter (Signed)
Pt placed in 11am slot.

## 2017-01-18 NOTE — ED Provider Notes (Signed)
[facility] DEPT [facility] Provider Note   CSN: [id number] Arrival date & time: 01/17/17  2205     History   Chief Complaint Chief Complaint  Patient presents with  . Cough    HPI Kyle Blair is a 56 y.o. male.  The history is provided by the patient. No language interpreter was used.  Cough  This is a new problem. The current episode started more than 2 days ago. The problem occurs constantly. The problem has not changed since onset.The cough is productive of purulent sputum (scant think yellow brown). Maximum temperature: fever but does not recall tmax. The fever has been present for 1 to 2 days. Associated symptoms include wheezing. Pertinent negatives include no chest pain. He has tried nothing for the symptoms. The treatment provided no relief. He is a smoker. His past medical history is significant for emphysema.    Past Medical History:  Diagnosis Date  . Arthritis   . Asthma   . BPH (benign prostatic hypertrophy)   . GERD with stricture   . History of stroke 2004  . Hyperlipidemia   . Hypertension   . Hypothyroid   . Lung nodule 02/03/2012  . Polycystic kidney disease   . Stroke [facility])     Patient Active Problem List   Diagnosis Date Noted  . Indeterminate pulmonary nodules 07/23/2015  . Obese 05/20/2015  . Undiagnosed cardiac murmurs 04/15/2015  . Hx of adenomatous colonic polyps 05/08/2013  . Barrett's esophagus 05/03/2013  . Chronic diarrhea 04/02/2013  . HTN (hypertension) 04/02/2013  . Hx of low back pain 09/26/2012  . Knee joint pain, right 05/02/2012  . Asthma 12/13/2011  . Benign prostatic hyperplasia 12/13/2011  . Hyperlipidemia 12/13/2011  . GERD (gastroesophageal reflux disease) 12/13/2011  . Hypothyroid   . Polycystic kidney disease     Past Surgical History:  Procedure Laterality Date  . CARPAL TUNNEL RELEASE    . COLONOSCOPY    . HERNIA REPAIR  2005   with mesh  . KNEE ARTHROSCOPY Bilateral 12/05/13   cyst and bone spur removed from  left knee per pt.  Marland Kitchen KNEE ARTHROSCOPY Left 01/2015  . NASAL POLYP EXCISION  1992 or 93  . POLYPECTOMY    . TOTAL KNEE ARTHROPLASTY Right 05/19/2015   Procedure: RIGHT TOTAL KNEE ARTHROPLASTY;  Surgeon: Paralee Cancel, MD;  Location: [facility];  Service: Orthopedics;  Laterality: Right;       Home Medications    Prior to Admission medications   Medication Sig Start Date End Date Taking? Authorizing Provider  acetaminophen (TYLENOL) 325 MG tablet Take 1-2 tablets (325-650 mg total) by mouth every 6 (six) hours as needed. 05/19/15   Danae Orleans, PA-C  ADVAIR DISKUS 250-50 MCG/DOSE AEPB INHALE 1 PUFF INTO THE LUNGS TWICE DAILY 01/16/17   [name], NP  amLODipine (NORVASC) 5 MG tablet TAKE ONE (1) TABLET EACH DAY 10/13/16   [name], NP  benazepril (LOTENSIN) 10 MG tablet TAKE 1 TABLET(10 MG) BY MOUTH DAILY 09/14/15   [name], NP  diphenhydrAMINE (BENADRYL) 25 MG tablet Take 25 mg by mouth at bedtime as needed for sleep.     Historical Provider, MD  docusate sodium (COLACE) 100 MG capsule Take 1 capsule (100 mg total) by mouth daily. Patient taking differently: Take 100 mg by mouth daily as needed.  09/02/15   [name], NP  furosemide (LASIX) 20 MG tablet Take 1 tablet (20 mg total) by mouth 2 (two) times daily. Patient taking differently: Take 20 mg  by mouth daily as needed.  04/12/16   [name], NP  gabapentin (NEURONTIN) 300 MG capsule Take 300 mg by mouth 4 (four) times daily.  09/18/15   Historical Provider, MD  levothyroxine (SYNTHROID) 200 MCG tablet Take 1 tablet (200 mcg total) by mouth daily before breakfast. 11/09/16   [name], NP  lovastatin (MEVACOR) 40 MG tablet TAKE ONE (1) TABLET AT BEDTIME 10/13/16   [name], NP  methocarbamol (ROBAXIN) 500 MG tablet Take 1 tablet (500 mg total) by mouth every 6 (six) hours as needed for muscle spasms. 05/19/15   Danae Orleans, PA-C  montelukast (SINGULAIR) 10 MG tablet TAKE ONE  TABLET DAILY IN THE MORNING 08/01/16   [name], NP  omeprazole (PRILOSEC) 40 MG capsule TAKE ONE CAPSULE BY MOUTH DAILY 12/01/16   [name], NP  oxyCODONE (OXYCONTIN) 40 mg 12 hr tablet Take 1 tablet by mouth 2 (two) times daily. 03/31/16   Historical Provider, MD  oxyCODONE (ROXICODONE) 15 MG immediate release tablet Take 15 mg by mouth every 4 (four) hours as needed for pain.    Historical Provider, MD  tamsulosin (FLOMAX) 0.4 MG CAPS capsule TAKE ONE CAPSULE BY MOUTH EVERY NIGHT AT BEDTIME Patient taking differently: TAKE ONE CAPSULE BY MOUTH EVERY DAY 12/01/16   [name], NP  varenicline (CHANTIX CONTINUING MONTH PAK) 1 MG tablet Take 1 tablet (1 mg total) by mouth 2 (two) times daily. 11/09/16   [name], NP  VENTOLIN HFA 108 (90 Base) MCG/ACT inhaler INHALE 2 PUFFS INTO THE LUNGS EVERY 4 HOURS AS NEEDED FOR WHEEZING ORSHORTNESS OF BREATH 01/16/17   [name], NP    Family History Family History  Problem Relation Age of Onset  . Heart disease Mother     pacemaker  . Diabetes Father   . Kidney disease Father   . Diabetes Sister   . Kidney disease Sister   . Colon cancer Neg Hx   . Esophageal cancer Neg Hx   . Rectal cancer Neg Hx   . Stomach cancer Neg Hx   . Prostate cancer Neg Hx   . Pancreatic cancer Neg Hx     Social History Social History  Substance Use Topics  . Smoking status: Current Every Day Smoker    Packs/day: 0.00    Years: 20.00    Types: Cigarettes  . Smokeless tobacco: Never Used  . Alcohol use No     Allergies   Aspirin; Doxycycline hyclate; Ibuprofen; Peanut butter flavor; and Cephalexin   Review of Systems Review of Systems  Constitutional: Positive for fever.  Respiratory: Positive for cough and wheezing.   Cardiovascular: Negative for chest pain.  Gastrointestinal: Negative for abdominal pain.  All other systems reviewed and are negative.    Physical Exam Updated Vital Signs BP 132/90 (BP  Location: Left Arm)   Pulse 95   Temp 99.2 F (37.3 C) (Oral)   Resp 18   Ht 5\' 6"  (1.676 m)   Wt 210 lb (95.3 kg)   SpO2 94%   BMI 33.89 kg/m   Physical Exam  Constitutional: He appears well-developed and well-nourished.  HENT:  Head: Normocephalic and atraumatic.  Mouth/Throat: No oropharyngeal exudate.  Eyes: EOM are normal. Pupils are equal, round, and reactive to light.  Neck: Normal range of motion. Neck supple. No JVD present.  Cardiovascular: Normal rate, regular rhythm, normal heart sounds and intact distal pulses.   Pulmonary/Chest: No stridor. He has decreased breath sounds in the right lower field and  the left lower field. He has wheezes. He has rhonchi. He has rales.  Lymphadenopathy:    He has no cervical adenopathy.  Skin: He is not diaphoretic.     ED Treatments / Results   Vitals:   01/17/17 2217 01/18/17 0138  BP: 131/79 132/90  Pulse: 93 95  Resp: 18 18  Temp: 98.7 F (37.1 C) 99.2 F (37.3 C)   Results for orders placed or performed during the hospital encounter of 01/18/17  CBC with Differential/Platelet  Result Value Ref Range   WBC 4.1 4.0 - 10.5 K/uL   RBC 4.86 4.22 - 5.81 MIL/uL   Hemoglobin 14.7 13.0 - 17.0 g/dL   HCT 42.3 39.0 - 52.0 %   MCV 87.0 78.0 - 100.0 fL   MCH 30.2 26.0 - 34.0 pg   MCHC 34.8 30.0 - 36.0 g/dL   RDW 13.1 11.5 - 15.5 %   Platelets 133 (L) 150 - 400 K/uL   Neutrophils Relative % 76 %   Neutro Abs 3.1 1.7 - 7.7 K/uL   Lymphocytes Relative 8 %   Lymphs Abs 0.3 (L) 0.7 - 4.0 K/uL   Monocytes Relative 16 %   Monocytes Absolute 0.7 0.1 - 1.0 K/uL   Eosinophils Relative 0 %   Eosinophils Absolute 0.0 0.0 - 0.7 K/uL   Basophils Relative 0 %   Basophils Absolute 0.0 0.0 - 0.1 K/uL  Comprehensive metabolic panel  Result Value Ref Range   Sodium 133 (L) 135 - 145 mmol/L   Potassium 4.2 3.5 - 5.1 mmol/L   Chloride 98 (L) 101 - 111 mmol/L   CO2 28 22 - 32 mmol/L   Glucose, Bld 107 (H) 65 - 99 mg/dL   BUN 7 6 - 20  mg/dL   Creatinine, Ser 0.84 0.61 - 1.24 mg/dL   Calcium 9.1 8.9 - 10.3 mg/dL   Total Protein 7.0 6.5 - 8.1 g/dL   Albumin 4.3 3.5 - 5.0 g/dL   AST 13 (L) 15 - 41 U/L   ALT 12 (L) 17 - 63 U/L   Alkaline Phosphatase 72 38 - 126 U/L   Total Bilirubin 0.4 0.3 - 1.2 mg/dL   GFR calc non Af Amer >60 >60 mL/min   GFR calc Af Amer >60 >60 mL/min   Anion gap 7 5 - 15   Dg Chest 2 View  Result Date: 01/17/2017 CLINICAL DATA:  Productive cough.  Fever with lethargy. EXAM: CHEST  2 VIEW COMPARISON:  05/24/2016 and 04/12/2016. FINDINGS: Two views study shows low volumes. Cardiopericardial silhouette is at upper limits of normal for size. There is pulmonary vascular congestion without overt pulmonary edema. Bilateral hilar fullness is similar to prior studies. There is bibasilar airspace disease on the current study, left greater than right. IMPRESSION: 1. Cardiomegaly with vascular congestion. 2. Bibasilar airspace disease, left greater than right. This may be atelectatic but pneumonia cannot be excluded. 3. Bilateral hilar prominence, similar to previous studies and likely further accentuated on today's study secondary to the low lung volumes. Electronically Signed   By: Misty Stanley M.D.   On: 01/17/2017 22:58    Radiology Dg Chest 2 View  Result Date: 01/17/2017 CLINICAL DATA:  Productive cough.  Fever with lethargy. EXAM: CHEST  2 VIEW COMPARISON:  05/24/2016 and 04/12/2016. FINDINGS: Two views study shows low volumes. Cardiopericardial silhouette is at upper limits of normal for size. There is pulmonary vascular congestion without overt pulmonary edema. Bilateral hilar fullness is similar to prior studies. There  is bibasilar airspace disease on the current study, left greater than right. IMPRESSION: 1. Cardiomegaly with vascular congestion. 2. Bibasilar airspace disease, left greater than right. This may be atelectatic but pneumonia cannot be excluded. 3. Bilateral hilar prominence, similar to  previous studies and likely further accentuated on today's study secondary to the low lung volumes. Electronically Signed   By: Misty Stanley M.D.   On: 01/17/2017 22:58    Procedures Procedures (including critical care time)  Medications Ordered in ED Medications  levofloxacin (LEVAQUIN) IVPB 750 mg (750 mg Intravenous New Bag/Given 01/18/17 0045)  albuterol (PROVENTIL) (2.5 MG/3ML) 0.083% nebulizer solution 5 mg (5 mg Nebulization Given 01/18/17 0039)  ipratropium (ATROVENT) nebulizer solution 0.5 mg (0.5 mg Nebulization Given 01/18/17 0039)     Final Clinical Impressions(s) / ED Diagnoses   Final diagnoses:  Community acquired pneumonia, unspecified laterality   Admit due to hypoxia.       Veatrice Kells, MD 01/18/17 [phone]

## 2017-01-18 NOTE — ED Provider Notes (Addendum)
9:36 AM no beds available at [facility]- pt can get a bed at [facility]  I have d/w Dr. Bonner Puna, hospitalist at [facility]- he recommends med/surg obs bed.    10:25 AM hospitalist has asked me to change bed request to inpatient- carelink is going to notify bed management of this change.    Alfonzo Beers, MD 01/18/17 [id number]    Alfonzo Beers, MD 01/18/17 1025

## 2017-01-19 ENCOUNTER — Inpatient Hospital Stay (HOSPITAL_COMMUNITY): Payer: Medicare Other

## 2017-01-19 ENCOUNTER — Telehealth: Payer: Self-pay | Admitting: Family

## 2017-01-19 DIAGNOSIS — J9600 Acute respiratory failure, unspecified whether with hypoxia or hypercapnia: Secondary | ICD-10-CM | POA: Diagnosis present

## 2017-01-19 DIAGNOSIS — J1100 Influenza due to unidentified influenza virus with unspecified type of pneumonia: Secondary | ICD-10-CM

## 2017-01-19 LAB — BASIC METABOLIC PANEL
Anion gap: 9 (ref 5–15)
BUN: 15 mg/dL (ref 6–20)
CALCIUM: 8.7 mg/dL — AB (ref 8.9–10.3)
CO2: 28 mmol/L (ref 22–32)
Chloride: 100 mmol/L — ABNORMAL LOW (ref 101–111)
Creatinine, Ser: 0.73 mg/dL (ref 0.61–1.24)
GFR calc Af Amer: >60 mL/min (ref >60)
GLUCOSE: 140 mg/dL — AB (ref 65–99)
Potassium: 4.7 mmol/L (ref 3.5–5.1)
SODIUM: 137 mmol/L (ref 135–145)

## 2017-01-19 LAB — CBC
HCT: 41.2 % (ref 39.0–52.0)
Hemoglobin: 14.3 g/dL (ref 13.0–17.0)
MCH: 29.8 pg (ref 26.0–34.0)
MCHC: 34.7 g/dL (ref 30.0–36.0)
MCV: 85.8 fL (ref 78.0–100.0)
PLATELETS: 129 10*3/uL — AB (ref 150–400)
RBC: 4.8 MIL/uL (ref 4.22–5.81)
RDW: 13.3 % (ref 11.5–15.5)
WBC: 4 10*3/uL (ref 4.0–10.5)

## 2017-01-19 LAB — RESPIRATORY PANEL BY PCR
Adenovirus: NOT DETECTED
BORDETELLA PERTUSSIS-RVPCR: NOT DETECTED
CORONAVIRUS 229E-RVPPCR: NOT DETECTED
CORONAVIRUS OC43-RVPPCR: NOT DETECTED
Chlamydophila pneumoniae: NOT DETECTED
Coronavirus HKU1: NOT DETECTED
Coronavirus NL63: NOT DETECTED
INFLUENZA B-RVPPCR: NOT DETECTED
Influenza A H3: DETECTED — AB
METAPNEUMOVIRUS-RVPPCR: NOT DETECTED
MYCOPLASMA PNEUMONIAE-RVPPCR: NOT DETECTED
PARAINFLUENZA VIRUS 1-RVPPCR: NOT DETECTED
Parainfluenza Virus 2: NOT DETECTED
Parainfluenza Virus 3: NOT DETECTED
Parainfluenza Virus 4: NOT DETECTED
RESPIRATORY SYNCYTIAL VIRUS-RVPPCR: NOT DETECTED
Rhinovirus / Enterovirus: NOT DETECTED

## 2017-01-19 LAB — BRAIN NATRIURETIC PEPTIDE: B NATRIURETIC PEPTIDE 5: 16.5 pg/mL (ref 0.0–100.0)

## 2017-01-19 LAB — HIV ANTIBODY (ROUTINE TESTING W REFLEX): HIV Screen 4th Generation wRfx: NONREACTIVE

## 2017-01-19 MED ORDER — ALBUTEROL SULFATE (2.5 MG/3ML) 0.083% IN NEBU: 2.5000 mg | INHALATION_SOLUTION | RESPIRATORY_TRACT | Status: DC | PRN

## 2017-01-19 MED ORDER — IPRATROPIUM-ALBUTEROL 0.5-2.5 (3) MG/3ML IN SOLN
3.0000 mL | Freq: Three times a day (TID) | RESPIRATORY_TRACT | Status: DC
  Administered 2017-01-20 – 2017-01-21 (×4): 3 mL via RESPIRATORY_TRACT
  Filled 2017-01-19 (×5): qty 3

## 2017-01-19 NOTE — Progress Notes (Signed)
PROGRESS NOTE    Kyle Blair  [id number] DOB: 03/30/1961 DOA: 01/18/2017 PCP: Nance Pear., NP   Brief Narrative: (Start on day 1 of progress note - keep it brief and live)  Kyle Blair is a 56 y.o. male with a history of emphysema who presented to the ED 1/23 for 2 days of worsening, severe, productive cough. Cough has been associated with abrupt onset fever, myalgias, fatigue, and dyspnea. Temperature of 99.40F and saturations below 90% on room air requiring supplemental oxygen per EDP report. CXR showed cardiomegaly and vascular congestion as well as L>R bibasilar airspace opacities. Levaquin was started for pneumonia, Flu positive on tamiflu.  Subjective- Complaints that he wants a regular diet, wants to ambulate without assist, and when he will be off O2. Breathing and congestion has improved.  Assessment & Plan:   Principal Problem:   Influenza with pneumonia Active Problems:   Hypothyroid   Asthma   Benign prostatic hyperplasia   Hyperlipidemia   GERD (gastroesophageal reflux disease)   Hx of low back pain   HTN (hypertension)   Community acquired pneumonia   Acute respiratory failure ([place])  Acute hypoxemic respiratory failure- 2/2 Influenza A and CAP, and Asthma. Repeat chest xray- left more than right lung infiltrates. - Wean off O2 as tolerated, O2 >92% - Cont levaquin- 1/24.  - Cont TAmiflu  - F/u Blood cultures 1/24 - urine strep- neg - urine pneu- not needed, with flu test positive  Acute asthma exacerbations - Check RVP. Start tamiflu and droplet precautions empirically pending results. - HIV antibody - Received levaquin in ED, will continue this. Cephalosporin allergy noted.  - Start steroids as he sounds tight with prolonged expiration and wheezing, pred 40mg  daily  - Continue advair BID - Duonebs q6h / Albuterol q3h prn - Incentive spirometry  Vascular congestion: Resolved, clear auscultation, Current wt 210lbs (217lbs at last PCP visit  11/15). Last echo 2016 with mild LVH, EF 60-65%, normal wall motion, G1DD. Normal RV size/function.  - Was taking lasix PRn at home - Hold for now - Check BNP- 16.5.  HTN: Normotensive in ED. - Continue norvasc 5mg  and benazepril 10mg  daily  Tobacco use:  - Nicotine patch - Brief cessation counseling  Chronic pain: NCCSRS reviewed, no red flags. Last fill for oxycontin 30mg  and oxycodone 10mg  on 1/22.  - Restart home doses of oxycontin and oxycodone.  - Continue neurontin 300mg  QID  Hypothyroidism: Last TSH Nov 2017 was 3.00. - Continue home synthroid  BPH: Chronic, stable - Continue flomax  DVT prophylaxis: Lovenox  Code Status: Full  Family Communication: None at bedside Disposition Plan: [place] back home once hypoxemia resolved  Consults called: None  Admission status: Inpatient, as patient requires supplemental oxygen and is expected to require treatment through 1/25 (start of therapy 1/23).  Consultants:   none  Procedures:   None   Antimicrobials: Levaquin 750mg  daily 1/24 >  Objective: Vitals:   01/19/17 0757 01/19/17 1336 01/19/17 1505 01/19/17 1950  BP:   115/84   Pulse:   77   Resp:   18   Temp:   98.7 F (37.1 C)   TempSrc:   Oral   SpO2: 90% 93% 92% 92%  Weight:      Height:        Intake/Output Summary (Last 24 hours) at 01/19/17 2042 Last data filed at 01/19/17 0831  Gross per 24 hour  Intake  240 ml  Output                0 ml  Net              240 ml   Filed Weights   01/17/17 2217  Weight: 95.3 kg (210 lb)   Examination:  General exam: Appears calm and comfortable  Respiratory system: Clear to auscultation. Respiratory effort normal. Cardiovascular system: S1 & S2 heard, RRR. No JVD, murmurs, rubs, gallops or clicks. No pedal edema. Gastrointestinal system: Abdomen is nondistended, soft and nontender. No organomegaly or masses felt. Normal bowel sounds heard. Central nervous system: Alert and oriented. No  focal neurological deficits. Extremities: Symmetric 5 x 5 power. Skin: No rashes, lesions or ulcers Psychiatry: Judgement and insight appear normal. Mood & affect appropriate.   Data Reviewed: I have personally reviewed following labs and imaging studies  CBC:  Recent Labs Lab 01/18/17 0038 01/18/17 1503 01/19/17 0551  WBC 4.1 3.5* 4.0  NEUTROABS 3.1  --   --   HGB 14.7 14.3 14.3  HCT 42.3 41.7 41.2  MCV 87.0 85.8 85.8  PLT 133* 121* Q000111Q*   Basic Metabolic Panel:  Recent Labs Lab 01/18/17 0038 01/18/17 1503 01/19/17 0551  NA 133*  --  137  K 4.2  --  4.7  CL 98*  --  100*  CO2 28  --  28  GLUCOSE 107*  --  140*  BUN 7  --  15  CREATININE 0.84 0.88 0.73  CALCIUM 9.1  --  8.7*   GFR: Estimated Creatinine Clearance: 112.7 mL/min (by C-G formula based on SCr of 0.73 mg/dL). Liver Function Tests:  Recent Labs Lab 01/18/17 0038  AST 13*  ALT 12*  ALKPHOS 72  BILITOT 0.4  PROT 7.0  ALBUMIN 4.3    Recent Results (from the past 240 hour(s))  Culture, blood (Routine X 2) w Reflex to ID Panel     Status: None (Preliminary result)   Collection Time: 01/18/17 12:30 AM  Result Value Ref Range Status   Specimen Description BLOOD RIGHT FOREARM  Final   Special Requests   Final    BOTTLES DRAWN AEROBIC AND ANAEROBIC AER 5cC ANA 5cc   Culture   Final    NO GROWTH 1 DAY Performed at [facility], [place] [address] [place], [place] [address]    Report Status PENDING  Incomplete  Culture, blood (Routine X 2) w Reflex to ID Panel     Status: None (Preliminary result)   Collection Time: 01/18/17 12:40 AM  Result Value Ref Range Status   Specimen Description BLOOD LEFT HAND  Final   Special Requests IN PEDIATRIC BOTTLE 3cc  Final   Culture   Final    NO GROWTH 1 DAY Performed at [facility], [place] [address], [place], [place] [address]    Report Status PENDING  Incomplete  Respiratory Panel by PCR     Status: Abnormal   Collection Time: 01/18/17  2:49 PM    Result Value Ref Range Status   Adenovirus NOT DETECTED NOT DETECTED Final   Coronavirus 229E NOT DETECTED NOT DETECTED Final   Coronavirus HKU1 NOT DETECTED NOT DETECTED Final   Coronavirus NL63 NOT DETECTED NOT DETECTED Final   Coronavirus OC43 NOT DETECTED NOT DETECTED Final   Metapneumovirus NOT DETECTED NOT DETECTED Final   Rhinovirus / Enterovirus NOT DETECTED NOT DETECTED Final   Influenza A H3 DETECTED (A) NOT DETECTED Final  Influenza B NOT DETECTED NOT DETECTED Final   Parainfluenza Virus 1 NOT DETECTED NOT DETECTED Final   Parainfluenza Virus 2 NOT DETECTED NOT DETECTED Final   Parainfluenza Virus 3 NOT DETECTED NOT DETECTED Final   Parainfluenza Virus 4 NOT DETECTED NOT DETECTED Final   Respiratory Syncytial Virus NOT DETECTED NOT DETECTED Final   Bordetella pertussis NOT DETECTED NOT DETECTED Final   Chlamydophila pneumoniae NOT DETECTED NOT DETECTED Final   Mycoplasma pneumoniae NOT DETECTED NOT DETECTED Final    Comment: Performed at [facility], [place] [address] [place], [place] [address]     Radiology Studies: Dg Chest 2 View  Result Date: 01/19/2017 CLINICAL DATA:  Shortness of breath and fever beginning today. History of asthma. EXAM: CHEST  2 VIEW COMPARISON:  01/17/2017.  05/24/2016. FINDINGS: Heart size is normal. Chronic ectasia of the aorta. Chronic interstitial lung markings and bronchial thickening. Patchy acute pneumonia and volume loss in the lower lobes left more than right. Upper lungs do not show any acute finding. Bony structures are unremarkable. IMPRESSION: Chronic interstitial lung disease. Bronchial thickening. Patchy acute infiltrates in the lower lobes, left more than right. Electronically Signed   By: Nelson Chimes M.D.   On: 01/19/2017 14:40   Dg Chest 2 View  Result Date: 01/17/2017 CLINICAL DATA:  Productive cough.  Fever with lethargy. EXAM: CHEST  2 VIEW COMPARISON:  05/24/2016 and 04/12/2016. FINDINGS: Two views study shows low volumes.  Cardiopericardial silhouette is at upper limits of normal for size. There is pulmonary vascular congestion without overt pulmonary edema. Bilateral hilar fullness is similar to prior studies. There is bibasilar airspace disease on the current study, left greater than right. IMPRESSION: 1. Cardiomegaly with vascular congestion. 2. Bibasilar airspace disease, left greater than right. This may be atelectatic but pneumonia cannot be excluded. 3. Bilateral hilar prominence, similar to previous studies and likely further accentuated on today's study secondary to the low lung volumes. Electronically Signed   By: Misty Stanley M.D.   On: 01/17/2017 22:58    Scheduled Meds: . amLODipine  5 mg Oral Daily  . enoxaparin (LOVENOX) injection  40 mg Subcutaneous Q24H  . gabapentin  300 mg Oral QID  . [START ON 01/20/2017] ipratropium-albuterol  3 mL Nebulization TID  . levofloxacin  750 mg Oral Q24H  . levothyroxine  200 mcg Oral QAC breakfast  . mometasone-formoterol  2 puff Inhalation BID  . montelukast  10 mg Oral Daily  . oseltamivir  75 mg Oral BID  . oxyCODONE  30 mg Oral Q12H  . pantoprazole  40 mg Oral Daily  . pravastatin  40 mg Oral q1800  . predniSONE  40 mg Oral Q breakfast  . tamsulosin  0.4 mg Oral Daily   Continuous Infusions:   LOS: 1 day    Bethena Roys, MD [facility] Pager [phone]  If 7PM-7AM, please contact night-coverage [email or web address] Password [facility] 01/19/2017, 8:42 PM

## 2017-01-19 NOTE — Telephone Encounter (Signed)
Pt called in to make provider aware that he is currently in the hospital and has been Dx with the flu and double pneumonia. I advised pt to call us once discharged to schedule a hospital F/U appt with PCP.

## 2017-01-20 LAB — LEGIONELLA PNEUMOPHILA SEROGP 1 UR AG: L. PNEUMOPHILA SEROGP 1 UR AG: NEGATIVE

## 2017-01-20 MED ORDER — LORAZEPAM 0.5 MG PO TABS
0.5000 mg | ORAL_TABLET | Freq: Once | ORAL | Status: AC
  Administered 2017-01-20: 0.5 mg via ORAL
  Filled 2017-01-20: qty 1

## 2017-01-20 MED ORDER — DM-GUAIFENESIN ER 30-600 MG PO TB12
1.0000 | ORAL_TABLET | Freq: Two times a day (BID) | ORAL | Status: DC
  Administered 2017-01-20 – 2017-01-21 (×3): 1 via ORAL
  Filled 2017-01-20 (×3): qty 1

## 2017-01-20 NOTE — Telephone Encounter (Signed)
Noted  

## 2017-01-20 NOTE — Progress Notes (Signed)
PROGRESS NOTE    Kyle Blair  [id number] DOB: [birth year] DOA: 01/18/2017 PCP: Nance Pear., NP   Brief Narrative: Kyle Blair is a 56 y.o. male with a history of emphysema who presented to the ED 1/23 for 2 days of worsening, severe, productive cough. Cough has been associated with abrupt onset fever, myalgias, fatigue, and dyspnea. Temperature of 99.55F and saturations below 90% on room air requiring supplemental oxygen per EDP report. CXR showed cardiomegaly and vascular congestion as well as L>R bibasilar airspace opacities. Levaquin was started for pneumonia, Flu positive on tamiflu.  Subjective- Still on O2. Feels better. Requesting mucinex.  Assessment & Plan:   Principal Problem:   Influenza with pneumonia Active Problems:   Hypothyroid   Asthma   Benign prostatic hyperplasia   Hyperlipidemia   GERD (gastroesophageal reflux disease)   Hx of low back pain   HTN (hypertension)   Community acquired pneumonia   Acute respiratory failure ([place])  Acute hypoxemic respiratory failure- 2/2 Influenza A and CAP, and Asthma. 01/19/17 Repeat chest xray- left more than right lung infiltrates. - Wean off O2 as tolerated, O2 >92% - Cont levaquin- 1/24 >> - Cont TAmiflu 1/24 >> - F/u Blood cultures 1/24 >> - Pt specifically request Prednisone taper on discharge for asthma, starting at 40-50mg  daily, as he says no taper and smaller doses result worsening asthma symptoms - Also specifically request albuterol solution re-prescription for his nebulizer - Ambulatory O2, likely discharge tomorrow If not requiring O2 - Mucinex - HIV neg  Acute asthma exacerbations -  - Received levaquin in ED, will continue this. Cephalosporin allergy noted.  - Start steroids as he sounds tight with prolonged expiration and wheezing, cont pred 40mg  daily  - Continue advair BID - Duonebs q6h / Albuterol q3h prn - Incentive spirometry  Vascular congestion: Resolved without diuretics, clear  auscultation, Current wt 210lbs (217lbs at last PCP visit 11/15). Last echo 2016 with mild LVH, EF 60-65%, normal wall motion, G1DD. Normal RV size/function.  - Was taking lasix PRN at home  HTN: Normotensive in ED. - Continue norvasc 5mg  and benazepril 10mg  daily  Tobacco use:  - Nicotine patch - Brief cessation counseling  Chronic pain: NCCSRS reviewed, no red flags. Last fill for oxycontin 30mg  and oxycodone 10mg  on 1/22.  - Restart home doses of oxycontin and oxycodone.  - Continue neurontin 300mg  QID  Hypothyroidism: Last TSH Nov 2017 was 3.00. - Continue home synthroid  BPH: Chronic, stable - Continue flomax  DVT prophylaxis: Lovenox  Code Status: Full  Family Communication: None at bedside Disposition Plan: Anticipate DC back home once hypoxemia resolves Consults called: None   Consultants:   none  Procedures:   None   Antimicrobials: Levaquin 750mg  daily 1/24 >  Objective: Vitals:   01/20/17 0644 01/20/17 0917 01/20/17 1434 01/20/17 1459  BP: 123/88   117/68  Pulse:    76  Resp: 18   18  Temp: 98.1 F (36.7 C)   97.8 F (36.6 C)  TempSrc: Oral   Oral  SpO2: 98% 94% 93% 98%  Weight:      Height:        Intake/Output Summary (Last 24 hours) at 01/20/17 1958 Last data filed at 01/20/17 1805  Gross per 24 hour  Intake              720 ml  Output              200 ml  Net  520 ml   Filed Weights   01/17/17 2217  Weight: 95.3 kg (210 lb)   Examination:  General exam: Appears calm and comfortable  Respiratory system: Clear to auscultation. Respiratory effort normal. Cardiovascular system: S1 & S2 heard, RRR. No JVD, murmurs, rubs, gallops or clicks. No pedal edema. Gastrointestinal system: Abdomen is nondistended, soft and nontender. No organomegaly or masses felt. Normal bowel sounds heard. Central nervous system: Alert and oriented. No focal neurological deficits. Extremities: Symmetric 5 x 5 power. Skin: No rashes, lesions or  ulcers Psychiatry: Judgement and insight appear normal. Mood & affect appropriate.   Data Reviewed: I have personally reviewed following labs and imaging studies  CBC:  Recent Labs Lab 01/18/17 0038 01/18/17 1503 01/19/17 0551  WBC 4.1 3.5* 4.0  NEUTROABS 3.1  --   --   HGB 14.7 14.3 14.3  HCT 42.3 41.7 41.2  MCV 87.0 85.8 85.8  PLT 133* 121* Q000111Q*   Basic Metabolic Panel:  Recent Labs Lab 01/18/17 0038 01/18/17 1503 01/19/17 0551  NA 133*  --  137  K 4.2  --  4.7  CL 98*  --  100*  CO2 28  --  28  GLUCOSE 107*  --  140*  BUN 7  --  15  CREATININE 0.84 0.88 0.73  CALCIUM 9.1  --  8.7*   GFR: Estimated Creatinine Clearance: 112.7 mL/min (by C-G formula based on SCr of 0.73 mg/dL). Liver Function Tests:  Recent Labs Lab 01/18/17 0038  AST 13*  ALT 12*  ALKPHOS 72  BILITOT 0.4  PROT 7.0  ALBUMIN 4.3    Recent Results (from the past 240 hour(s))  Culture, blood (Routine X 2) w Reflex to ID Panel     Status: None (Preliminary result)   Collection Time: 01/18/17 12:30 AM  Result Value Ref Range Status   Specimen Description BLOOD RIGHT FOREARM  Final   Special Requests   Final    BOTTLES DRAWN AEROBIC AND ANAEROBIC AER 5cC ANA 5cc   Culture   Final    NO GROWTH 2 DAYS Performed at [facility], [place] [address], [place], [place] [address]    Report Status PENDING  Incomplete  Culture, blood (Routine X 2) w Reflex to ID Panel     Status: None (Preliminary result)   Collection Time: 01/18/17 12:40 AM  Result Value Ref Range Status   Specimen Description BLOOD LEFT HAND  Final   Special Requests IN PEDIATRIC BOTTLE 3cc  Final   Culture   Final    NO GROWTH 2 DAYS Performed at [facility], [place] [address] [place], [place] [address]    Report Status PENDING  Incomplete  Respiratory Panel by PCR     Status: Abnormal   Collection Time: 01/18/17  2:49 PM  Result Value Ref Range Status   Adenovirus NOT DETECTED NOT DETECTED Final   Coronavirus  229E NOT DETECTED NOT DETECTED Final   Coronavirus HKU1 NOT DETECTED NOT DETECTED Final   Coronavirus NL63 NOT DETECTED NOT DETECTED Final   Coronavirus OC43 NOT DETECTED NOT DETECTED Final   Metapneumovirus NOT DETECTED NOT DETECTED Final   Rhinovirus / Enterovirus NOT DETECTED NOT DETECTED Final   Influenza A H3 DETECTED (A) NOT DETECTED Final   Influenza B NOT DETECTED NOT DETECTED Final   Parainfluenza Virus 1 NOT DETECTED NOT DETECTED Final   Parainfluenza Virus 2 NOT DETECTED NOT DETECTED Final   Parainfluenza Virus 3 NOT DETECTED NOT DETECTED Final  Parainfluenza Virus 4 NOT DETECTED NOT DETECTED Final   Respiratory Syncytial Virus NOT DETECTED NOT DETECTED Final   Bordetella pertussis NOT DETECTED NOT DETECTED Final   Chlamydophila pneumoniae NOT DETECTED NOT DETECTED Final   Mycoplasma pneumoniae NOT DETECTED NOT DETECTED Final    Comment: Performed at [facility], [place] [address] [place], [place] [address]     Radiology Studies: Dg Chest 2 View  Result Date: 01/19/2017 CLINICAL DATA:  Shortness of breath and fever beginning today. History of asthma. EXAM: CHEST  2 VIEW COMPARISON:  01/17/2017.  05/24/2016. FINDINGS: Heart size is normal. Chronic ectasia of the aorta. Chronic interstitial lung markings and bronchial thickening. Patchy acute pneumonia and volume loss in the lower lobes left more than right. Upper lungs do not show any acute finding. Bony structures are unremarkable. IMPRESSION: Chronic interstitial lung disease. Bronchial thickening. Patchy acute infiltrates in the lower lobes, left more than right. Electronically Signed   By: Nelson Chimes M.D.   On: 01/19/2017 14:40    Scheduled Meds: . amLODipine  5 mg Oral Daily  . dextromethorphan-guaiFENesin  1 tablet Oral BID  . enoxaparin (LOVENOX) injection  40 mg Subcutaneous Q24H  . gabapentin  300 mg Oral QID  . ipratropium-albuterol  3 mL Nebulization TID  . levofloxacin  750 mg Oral Q24H  . levothyroxine   200 mcg Oral QAC breakfast  . mometasone-formoterol  2 puff Inhalation BID  . montelukast  10 mg Oral Daily  . oseltamivir  75 mg Oral BID  . oxyCODONE  30 mg Oral Q12H  . pantoprazole  40 mg Oral Daily  . pravastatin  40 mg Oral q1800  . predniSONE  40 mg Oral Q breakfast  . tamsulosin  0.4 mg Oral Daily   Continuous Infusions:   LOS: 2 days    Bethena Roys, MD [facility] Pager [phone]  If 7PM-7AM, please contact night-coverage [email or web address] Password [facility] 01/20/2017, 7:58 PM

## 2017-01-21 DIAGNOSIS — E039 Hypothyroidism, unspecified: Secondary | ICD-10-CM

## 2017-01-21 DIAGNOSIS — N401 Enlarged prostate with lower urinary tract symptoms: Secondary | ICD-10-CM

## 2017-01-21 DIAGNOSIS — J189 Pneumonia, unspecified organism: Secondary | ICD-10-CM

## 2017-01-21 DIAGNOSIS — J9600 Acute respiratory failure, unspecified whether with hypoxia or hypercapnia: Secondary | ICD-10-CM

## 2017-01-21 DIAGNOSIS — E785 Hyperlipidemia, unspecified: Secondary | ICD-10-CM

## 2017-01-21 DIAGNOSIS — I10 Essential (primary) hypertension: Secondary | ICD-10-CM

## 2017-01-21 DIAGNOSIS — J45901 Unspecified asthma with (acute) exacerbation: Secondary | ICD-10-CM

## 2017-01-21 MED ORDER — PREDNISONE 10 MG PO TABS: ORAL_TABLET | ORAL | 0 refills | Status: DC

## 2017-01-21 MED ORDER — OSELTAMIVIR PHOSPHATE 75 MG PO CAPS: 75.0000 mg | ORAL_CAPSULE | Freq: Two times a day (BID) | ORAL | 0 refills | Status: DC

## 2017-01-21 MED ORDER — ALBUTEROL SULFATE (2.5 MG/3ML) 0.083% IN NEBU: 2.5000 mg | INHALATION_SOLUTION | Freq: Four times a day (QID) | RESPIRATORY_TRACT | 12 refills | Status: DC | PRN

## 2017-01-21 MED ORDER — FUROSEMIDE 20 MG PO TABS: 20.0000 mg | ORAL_TABLET | Freq: Every day | ORAL | 0 refills | Status: DC | PRN

## 2017-01-21 MED ORDER — DM-GUAIFENESIN ER 30-600 MG PO TB12: 1.0000 | ORAL_TABLET | Freq: Two times a day (BID) | ORAL | 0 refills | Status: DC

## 2017-01-21 MED ORDER — LEVOFLOXACIN 750 MG PO TABS: 750.0000 mg | ORAL_TABLET | ORAL | 0 refills | Status: DC

## 2017-01-21 NOTE — Progress Notes (Signed)
Patient discharged home.  Leaving with personal belongings and prescriptions.  Reports understanding of discharge instructions.  Room air, denies pain, no s/s of distress.  Accompanied by wife.  No complaints.  When patient leaving floor, pt reports his wife states there is a dosage problem with two prescriptions at the pharmacy - levaquin and tamaflu.  Physician contacted.  Physician reports she spoke with the patient's pharmacy and clarified the questions about the two prescriptions.

## 2017-01-21 NOTE — Discharge Summary (Signed)
Physician Discharge Summary  Kyle Blair [id number] DOB: Nov 14, 1961 DOA: 01/18/2017  PCP: Nance Pear., NP  Admit date: 01/18/2017 Discharge date: 01/21/2017  Admitted From: Home Disposition:  Home  Recommendations for Outpatient Follow-up:  1. Follow up with PCP in 1-2 weeks 2. Please obtain BMP/CBC in one week 3. CXR 4-6 after resolution of pneumonia.    Home Health:No Equipment/Devices:No  Discharge Condition:Stable CODE STATUS:Full Diet recommendation:Regular   Brief/Interim Summary: Kyle Rasmussenis a 56 y.o.malewith a history of emphysema who presented to the ED 1/23 for 2 days of worsening, severe, productive cough. Cough has beenassociated with abrupt onset fever, myalgias, fatigue,and dyspnea. Temperature of 99.69F and saturations below 90% on room air requiring supplemental oxygen per EDP report. CXR showed cardiomegaly and vascular congestion as well as L>R bibasilar airspace opacities. Levaquin was started for pneumonia, Flu positive on tamiflu.  He is improved on 01/21/17.    Discharge Diagnoses:   Acute respiratory failure 2/2 Influenza A with pneumonia and asthma Kyle Blair was admitted with flu like illness and was found to have influenza A.  He was also noted on CXR to have a L>R basilar infiltrate thought to be a pneumonia.  He was initiated on tamiflu and Levaquin for his pneumonia.  Given his history of asthma, he did have wheezing and difficulty breathing.  He was very adamant that his asthma usually takes "high dose" prednisone to clear up or he will be back in the hospital.  He was initiated on prednisone 40mg  in the hospital.  He requested a long taper on discharge so that his symptoms would not flare back up.  He was discharged with tamiflu and levaquin to complete a 5 day course.  Urine antigens were negative.  He was managed with duonebs and this medication was also prescribed at discharge.  He slowly improved and was stable for discharge.   BC X 2 were NGTD  Acute asthma exacerbation See above.  He was managed with duonebs, oxygen and steroids.  His acute infections were treated.  He was not requiring oxygen on day of discharge and was breathing well on room air and speaking in complete sentences.  He was discharge with a steroid taper and duoneb solution.    Hypothyroid This was not an acute issue during hospitalization.  He was continued on his home dose of synthroid  Benign prostatic hyperplasia He was continued on his home dose of flomax.     Hyperlipidemia He was continued on his home dose of statin    GERD (gastroesophageal reflux disease) Home dose of omeprazole was continued on discharge    Hx of low back pain He is on chronic narcotics, these were provided during his hospital stay as well as gabapentin.  He is to resume home dosing on discharge.      HTN (hypertension) He was continued on his home amlodipine.  As he was normotensive on this medication alone, his ACE-I was held.  He was advised to restart the ACE-I at home if he felt well.   Vascular Congestion Seen on CXR on admission.  He normally takes lasix PRN at home.  He was initially treated with BID lasix 20mg  with improvement in his congestion on subsequent CXR.  He was discharged on his home dose of lasix.   Tobacco Abuse He was advised to quit smoking.    Discharge Instructions  Discharge Instructions    Call MD for:    Complete by:  As directed    Call MD for:  difficulty breathing, headache or visual disturbances    Complete by:  As directed    Call MD for:  extreme fatigue    Complete by:  As directed    Call MD for:  persistant dizziness or light-headedness    Complete by:  As directed    Call MD for:  persistant nausea and vomiting    Complete by:  As directed    Call MD for:  severe uncontrolled pain    Complete by:  As directed    Call MD for:  temperature >100.4    Complete by:  As directed    Diet - low sodium heart healthy     Complete by:  As directed    Discharge instructions    Complete by:  As directed    Kyle Blair - - You were admitted for the flu and pneumonia.  You will go home on Levaquin.  You should take this for 2 more days.  You will go home on Tamiflu.  You should take this once today and then twice tomorrow.  I have also sent you home with a prednisone taper.  Please see your primary doctor within the week to further discuss this.  You should not take high dose steroids for too many days in a row or you may have side effects from the steroids.  You were not started on your home benazapril.  Please restart this medication tomorrow if you are still feeling well.   Follow with Primary MD Nance Pear., NP in 7 days   Get bloodwork checked by Primary MD  5-7 days if appropriate ( we routinely change or add medications that can affect your baseline labs and fluid status, therefore we recommend that you get the mentioned basic workup next visit with your PCP, your PCP may decide not to get them or add new tests based on their clinical decision)   Activity: As tolerated    Disposition Home   Diet:   Regular  On your next visit with your primary care physician please Get Medicines reviewed and adjusted.   Please request your primary doctor to go over all Hospital Tests and Procedure/Radiological results at the follow up, please get all Hospital records sent to your primary doctor by signing hospital release before you go home.   If you experience worsening of your admission symptoms, develop shortness of breath, life threatening emergency, suicidal or homicidal thoughts you must seek medical attention immediately by calling 911 or calling your MD immediately  if symptoms less severe.  You Must read complete instructions/literature along with all the possible adverse reactions/side effects for all the Medicines you take and that have been prescribed to you. Take any new Medicines after you have  completely understood and accpet all the possible adverse reactions/side effects.   Do not drive when taking Pain medications.    Do not take more than prescribed Pain, Sleep and Anxiety Medications  Special Instructions: If you have smoked or chewed Tobacco  in the last 2 yrs please stop smoking, stop any regular Alcohol  and or any Recreational drug use.  Wear Seat belts while driving.   Please note  You were cared for by a hospitalist during your hospital stay. If you have any questions about your discharge medications or the care you received while you were in the hospital after you are discharged, you can call the unit and asked to speak with the hospitalist on call if the hospitalist that took care  of you is not available. Once you are discharged, your primary care physician will handle any further medical issues. Please note that NO REFILLS for any discharge medications will be authorized once you are discharged, as it is imperative that you return to your primary care physician (or establish a relationship with a primary care physician if you do not have one) for your aftercare needs so that they can reassess your need for medications and monitor your lab values.   Increase activity slowly    Complete by:  As directed      Allergies as of 01/21/2017      Reactions   Aspirin Anaphylaxis   Doxycycline Hyclate Swelling   Facial Swelling   Ibuprofen Anaphylaxis   Peanut Butter Flavor Anaphylaxis   Cephalexin Rash      Medication List    TAKE these medications   acetaminophen 325 MG tablet Commonly known as:  TYLENOL Take 1-2 tablets (325-650 mg total) by mouth every 6 (six) hours as needed.   ADVAIR DISKUS 250-50 MCG/DOSE Aepb Generic drug:  Fluticasone-Salmeterol INHALE 1 PUFF INTO THE LUNGS TWICE DAILY   amLODipine 5 MG tablet Commonly known as:  NORVASC TAKE ONE (1) TABLET EACH DAY   benazepril 10 MG tablet Commonly known as:  LOTENSIN TAKE 1 TABLET(10 MG) BY MOUTH  DAILY   dextromethorphan-guaiFENesin 30-600 MG 12hr tablet Commonly known as:  MUCINEX DM Take 1 tablet by mouth 2 (two) times daily.   diphenhydrAMINE 25 MG tablet Commonly known as:  BENADRYL Take 25 mg by mouth at bedtime as needed for sleep.   docusate sodium 100 MG capsule Commonly known as:  COLACE Take 1 capsule (100 mg total) by mouth daily. What changed:  when to take this  reasons to take this   furosemide 20 MG tablet Commonly known as:  LASIX Take 1 tablet (20 mg total) by mouth daily as needed.   gabapentin 300 MG capsule Commonly known as:  NEURONTIN Take 300 mg by mouth 3 (three) times daily.   levofloxacin 750 MG tablet Commonly known as:  LEVAQUIN Take 1 tablet (750 mg total) by mouth daily.   levothyroxine 200 MCG tablet Commonly known as:  SYNTHROID Take 1 tablet (200 mcg total) by mouth daily before breakfast.   lovastatin 40 MG tablet Commonly known as:  MEVACOR TAKE ONE (1) TABLET AT BEDTIME   methocarbamol 500 MG tablet Commonly known as:  ROBAXIN Take 1 tablet (500 mg total) by mouth every 6 (six) hours as needed for muscle spasms.   montelukast 10 MG tablet Commonly known as:  SINGULAIR TAKE ONE TABLET DAILY IN THE MORNING   omeprazole 40 MG capsule Commonly known as:  PRILOSEC TAKE ONE CAPSULE BY MOUTH DAILY   oseltamivir 75 MG capsule Commonly known as:  TAMIFLU Take 1 capsule (75 mg total) by mouth 2 (two) times daily.   OXYCONTIN 30 MG 12 hr tablet Generic drug:  oxyCODONE Take 30 mg by mouth 2 (two) times daily.   Oxycodone HCl 10 MG Tabs Take 10 mg by mouth 3 (three) times daily as needed (pain.).   predniSONE 10 MG tablet Commonly known as:  DELTASONE Take 50mg  (5tab) for 5 days, 40mg  (4 tab) for 3 days and 30mg  (3 tab) for 3 days then stop.   tamsulosin 0.4 MG Caps capsule Commonly known as:  FLOMAX TAKE ONE CAPSULE BY MOUTH EVERY NIGHT AT BEDTIME What changed:  See the new instructions.   VENTOLIN HFA 108 (90  Base)  MCG/ACT inhaler Generic drug:  albuterol INHALE 2 PUFFS INTO THE LUNGS EVERY 4 HOURS AS NEEDED FOR WHEEZING ORSHORTNESS OF BREATH What changed:  Another medication with the same name was added. Make sure you understand how and when to take each.   albuterol (2.5 MG/3ML) 0.083% nebulizer solution Commonly known as:  PROVENTIL Take 3 mLs (2.5 mg total) by nebulization every 6 (six) hours as needed for wheezing or shortness of breath. What changed:  You were already taking a medication with the same name, and this prescription was added. Make sure you understand how and when to take each.       Allergies  Allergen Reactions  . Aspirin Anaphylaxis  . Doxycycline Hyclate Swelling    Facial Swelling  . Ibuprofen Anaphylaxis  . Peanut Butter Flavor Anaphylaxis  . Cephalexin Rash    Consultations:  None   Procedures/Studies: Dg Chest 2 View  Result Date: 01/19/2017 CLINICAL DATA:  Shortness of breath and fever beginning today. History of asthma. EXAM: CHEST  2 VIEW COMPARISON:  01/17/2017.  05/24/2016. FINDINGS: Heart size is normal. Chronic ectasia of the aorta. Chronic interstitial lung markings and bronchial thickening. Patchy acute pneumonia and volume loss in the lower lobes left more than right. Upper lungs do not show any acute finding. Bony structures are unremarkable. IMPRESSION: Chronic interstitial lung disease. Bronchial thickening. Patchy acute infiltrates in the lower lobes, left more than right. Electronically Signed   By: Nelson Chimes M.D.   On: 01/19/2017 14:40   Dg Chest 2 View  Result Date: 01/17/2017 CLINICAL DATA:  Productive cough.  Fever with lethargy. EXAM: CHEST  2 VIEW COMPARISON:  05/24/2016 and 04/12/2016. FINDINGS: Two views study shows low volumes. Cardiopericardial silhouette is at upper limits of normal for size. There is pulmonary vascular congestion without overt pulmonary edema. Bilateral hilar fullness is similar to prior studies. There is  bibasilar airspace disease on the current study, left greater than right. IMPRESSION: 1. Cardiomegaly with vascular congestion. 2. Bibasilar airspace disease, left greater than right. This may be atelectatic but pneumonia cannot be excluded. 3. Bilateral hilar prominence, similar to previous studies and likely further accentuated on today's study secondary to the low lung volumes. Electronically Signed   By: Misty Stanley M.D.   On: 01/17/2017 22:58       Subjective: Improved today.  He is speaking in full sentences, off oxygen.  He reports lifelong history of asthma and knowing that he will need a higher dose of prednisone going home.  Also feels the guaifenesin helped him significantly.    Discharge Exam: Vitals:   01/21/17 0559 01/21/17 1327  BP: 133/81 (!) 133/93  Pulse: 78 76  Resp: 18 18  Temp: 99.6 F (37.6 C) 97.8 F (36.6 C)   Vitals:   01/21/17 0559 01/21/17 0823 01/21/17 0824 01/21/17 1327  BP: 133/81   (!) 133/93  Pulse: 78   76  Resp: 18   18  Temp: 99.6 F (37.6 C)   97.8 F (36.6 C)  TempSrc: Oral   Oral  SpO2: 95% 95% 95% 96%  Weight:      Height:        General exam: Sitting in chair, Appears calm and comfortable  Eyes: injected conjunctivae, no icterus Respiratory system: Clear to auscultation. Respiratory effort normal. No wheezing or crackles.  Cardiovascular system: S1 & S2 heard, RR, NR. No murmurs.   No pedal edema. Gastrointestinal system: Abdomen is nondistended, soft and nontender. +BS Central nervous system: Alert  and oriented. No focal neurological deficits. Skin: He has a reddish color to the skin, some chronic skin changes.  Psychiatry: Judgement and insight appear normal. Mood & affect appropriate.  Somewhat pressured speech, but calmed down with continued conversation.    The results of significant diagnostics from this hospitalization (including imaging, microbiology, ancillary and laboratory) are listed below for reference.      Microbiology: Recent Results (from the past 240 hour(s))  Culture, blood (Routine X 2) w Reflex to ID Panel     Status: None (Preliminary result)   Collection Time: 01/18/17 12:30 AM  Result Value Ref Range Status   Specimen Description BLOOD RIGHT FOREARM  Final   Special Requests   Final    BOTTLES DRAWN AEROBIC AND ANAEROBIC AER 5cC ANA 5cc   Culture   Final    NO GROWTH 3 DAYS Performed at [facility], [address] [place], [place] [address]    Report Status PENDING  Incomplete  Culture, blood (Routine X 2) w Reflex to ID Panel     Status: None (Preliminary result)   Collection Time: 01/18/17 12:40 AM  Result Value Ref Range Status   Specimen Description BLOOD LEFT HAND  Final   Special Requests IN PEDIATRIC BOTTLE 3cc  Final   Culture   Final    NO GROWTH 3 DAYS Performed at [facility], [place] [address] [place], [place] [address]    Report Status PENDING  Incomplete  Respiratory Panel by PCR     Status: Abnormal   Collection Time: 01/18/17  2:49 PM  Result Value Ref Range Status   Adenovirus NOT DETECTED NOT DETECTED Final   Coronavirus 229E NOT DETECTED NOT DETECTED Final   Coronavirus HKU1 NOT DETECTED NOT DETECTED Final   Coronavirus NL63 NOT DETECTED NOT DETECTED Final   Coronavirus OC43 NOT DETECTED NOT DETECTED Final   Metapneumovirus NOT DETECTED NOT DETECTED Final   Rhinovirus / Enterovirus NOT DETECTED NOT DETECTED Final   Influenza A H3 DETECTED (A) NOT DETECTED Final   Influenza B NOT DETECTED NOT DETECTED Final   Parainfluenza Virus 1 NOT DETECTED NOT DETECTED Final   Parainfluenza Virus 2 NOT DETECTED NOT DETECTED Final   Parainfluenza Virus 3 NOT DETECTED NOT DETECTED Final   Parainfluenza Virus 4 NOT DETECTED NOT DETECTED Final   Respiratory Syncytial Virus NOT DETECTED NOT DETECTED Final   Bordetella pertussis NOT DETECTED NOT DETECTED Final   Chlamydophila pneumoniae NOT DETECTED NOT DETECTED Final   Mycoplasma pneumoniae NOT  DETECTED NOT DETECTED Final    Comment: Performed at [facility], [place] [address], [place],  [address]     Labs: BNP (last 3 results)  Recent Labs  01/19/17 0551  BNP 0000000   Basic Metabolic Panel:  Recent Labs Lab 01/18/17 0038 01/18/17 1503 01/19/17 0551  NA 133*  --  137  K 4.2  --  4.7  CL 98*  --  100*  CO2 28  --  28  GLUCOSE 107*  --  140*  BUN 7  --  15  CREATININE 0.84 0.88 0.73  CALCIUM 9.1  --  8.7*   Liver Function Tests:  Recent Labs Lab 01/18/17 0038  AST 13*  ALT 12*  ALKPHOS 72  BILITOT 0.4  PROT 7.0  ALBUMIN 4.3   No results for input(s): LIPASE, AMYLASE in the last 168 hours. No results for input(s): AMMONIA in the last 168 hours. CBC:  Recent Labs Lab 01/18/17  0038 01/18/17 1503 01/19/17 0551  WBC 4.1 3.5* 4.0  NEUTROABS 3.1  --   --   HGB 14.7 14.3 14.3  HCT 42.3 41.7 41.2  MCV 87.0 85.8 85.8  PLT 133* 121* 129*   Urinalysis    Component Value Date/Time   COLORURINE YELLOW 05/14/2015 1015   [place] 05/14/2015 1015   LABSPEC 1.013 05/14/2015 1015   PHURINE 7.0 05/14/2015 1015   GLUCOSEU NEGATIVE 05/14/2015 1015   GLUCOSEU NEGATIVE 04/15/2015 0945   HGBUR NEGATIVE 05/14/2015 1015   [place] 05/14/2015 1015   KETONESUR NEGATIVE 05/14/2015 1015   PROTEINUR NEGATIVE 05/14/2015 1015   UROBILINOGEN 0.2 05/14/2015 1015   NITRITE NEGATIVE 05/14/2015 1015   LEUKOCYTESUR NEGATIVE 05/14/2015 1015   Sepsis Labs Invalid input(s): PROCALCITONIN,  WBC,  LACTICIDVEN Microbiology Recent Results (from the past 240 hour(s))  Culture, blood (Routine X 2) w Reflex to ID Panel     Status: None (Preliminary result)   Collection Time: 01/18/17 12:30 AM  Result Value Ref Range Status   Specimen Description BLOOD RIGHT FOREARM  Final   Special Requests   Final    BOTTLES DRAWN AEROBIC AND ANAEROBIC AER 5cC ANA 5cc   Culture   Final    NO GROWTH 3 DAYS Performed at [facility], [place] [address],  [place], [place] [address]    Report Status PENDING  Incomplete  Culture, blood (Routine X 2) w Reflex to ID Panel     Status: None (Preliminary result)   Collection Time: 01/18/17 12:40 AM  Result Value Ref Range Status   Specimen Description BLOOD LEFT HAND  Final   Special Requests IN PEDIATRIC BOTTLE 3cc  Final   Culture   Final    NO GROWTH 3 DAYS Performed at [facility], [place] [address] [place], [place] [address]    Report Status PENDING  Incomplete  Respiratory Panel by PCR     Status: Abnormal   Collection Time: 01/18/17  2:49 PM  Result Value Ref Range Status   Adenovirus NOT DETECTED NOT DETECTED Final   Coronavirus 229E NOT DETECTED NOT DETECTED Final   Coronavirus HKU1 NOT DETECTED NOT DETECTED Final   Coronavirus NL63 NOT DETECTED NOT DETECTED Final   Coronavirus OC43 NOT DETECTED NOT DETECTED Final   Metapneumovirus NOT DETECTED NOT DETECTED Final   Rhinovirus / Enterovirus NOT DETECTED NOT DETECTED Final   Influenza A H3 DETECTED (A) NOT DETECTED Final   Influenza B NOT DETECTED NOT DETECTED Final   Parainfluenza Virus 1 NOT DETECTED NOT DETECTED Final   Parainfluenza Virus 2 NOT DETECTED NOT DETECTED Final   Parainfluenza Virus 3 NOT DETECTED NOT DETECTED Final   Parainfluenza Virus 4 NOT DETECTED NOT DETECTED Final   Respiratory Syncytial Virus NOT DETECTED NOT DETECTED Final   Bordetella pertussis NOT DETECTED NOT DETECTED Final   Chlamydophila pneumoniae NOT DETECTED NOT DETECTED Final   Mycoplasma pneumoniae NOT DETECTED NOT DETECTED Final    Comment: Performed at [facility], [place] [address], [place],  [address]     Time coordinating discharge: 60 minutes  SIGNED:   Gilles Chiquito, MD  [facility] 01/21/2017, 1:32 PM   If 7PM-7AM, please contact night-coverage [email or web address] Password [email or web address]

## 2017-01-21 NOTE — Progress Notes (Signed)
PROGRESS NOTE    Kyle Blair  [id number] DOB: April 04, 1961 DOA: 01/18/2017 PCP: Kyle Pear., NP    Brief Narrative:  Kyle Rasmussenis a 56 y.o.malewith a history of emphysema who presented to the ED 1/23 for 2 days of worsening, severe, productive cough. Cough has beenassociated with abrupt onset fever, myalgias, fatigue,and dyspnea. Temperature of 99.61F and saturations below 90% on room air requiring supplemental oxygen per EDP report. CXR showed cardiomegaly and vascular congestion as well as L>R bibasilar airspace opacities. Levaquin was started for pneumonia, Flu positive on tamiflu.  He is improved on 01/21/17.     Assessment & Plan:   Acute respiratory failure 2/2 Influenza A with pneumonia and asthma - he has weaned off of oxygen well - He is on Levaquin for a 5 day course for CAP - Started 1/25 --> 1/29 - He was also positive for flu, started on Tamiflu 1/25 --> 1/29 - BC from 1/24 NGTD - He is tolerating activity without oxygen, ready to be discharged  Acute asthma exacerbation - He was started on PO prednisone and symptoms did improve.  He requested a very specific taper and reports that he always requires a high dose taper to get his wheezing under control.  Requesting at least 5 days of prednisone 50mg .  - Prednisone 50mg  X 5 days, then 40mg  for 3 days and 30mg  for 3 days.  Advised him not to take steroids any longer than 2 weeks and to discuss with his PCP as soon as possible during the week.   - Nebulizer albuterol refill - continue advair (formulary) and restart dulera on discharge - Singulair - Mucinex DM - Kyle Blair while in house  Vascular congestion - did not appear to be an issue on repeat CXR.  His SOB and other symptoms improved with above therapy - Repeat CXR in 4-6 weeks post discharge - Continue PRN lasix  HTN - Normotensive - He has been on home amlodipine, tamsulosin - Advised him to restart benazapril and lasix PRN on day after  discharge, discuss with PCP  Tobacco abuse - Advised cessation during this hospital stay  Chronic pain, back pain - Continue home home narcotics and gabapentin - Resume at discharge    Hypothyroid - Continued on home synthroid  Benign prostatic hyperplasia - Home tamsulosin    Hyperlipidemia - Formulary statin while here, resume home lovastatin on discharge    GERD (gastroesophageal reflux disease) - Resume home omeprazole on discharge.    DVT prophylaxis: Kyle Blair  Code Status: Full  Disposition Plan: Discharge today.    Consultants:   None  Procedures:   None  Antimicrobials:   Tamiflu 1/25 --> 1/29  Levaquin  1/25 --> 1/29   Subjective: Improved today.  He is speaking in full sentences, off oxygen.  He reports lifelong history of asthma and knowing that he will need a higher dose of prednisone going home.  Also feels the guaifenesin helped him significantly.    Objective: Vitals:   01/20/17 2055 01/21/17 0559 01/21/17 0823 01/21/17 0824  BP: 131/80 133/81    Pulse: 70 78    Resp: 18 18    Temp: 98 F (36.7 C) 99.6 F (37.6 C)    TempSrc: Oral Oral    SpO2: 95% 95% 95% 95%  Weight:      Height:        Intake/Output Summary (Last 24 hours) at 01/21/17 1057 Last data filed at 01/21/17 0957  Gross per 24 hour  Intake  720 ml  Output                0 ml  Net              720 ml   Filed Weights   01/17/17 2217  Weight: 210 lb (95.3 kg)    Examination:  General exam: Sitting in chair, Appears calm and comfortable  Eyes: injected conjunctivae, no icterus Respiratory system: Clear to auscultation. Respiratory effort normal. No wheezing or crackles.  Cardiovascular system: S1 & S2 heard, RR, NR. No murmurs.   No pedal edema. Gastrointestinal system: Abdomen is nondistended, soft and nontender. +BS Central nervous system: Alert and oriented. No focal neurological deficits. Skin: He has a reddish color to the skin, some chronic skin  changes.  Psychiatry: Judgement and insight appear normal. Mood & affect appropriate.  Somewhat pressured speech, but calmed down with continued conversation.     Data Reviewed: I have personally reviewed following labs and imaging studies  CBC:  Recent Labs Lab 01/18/17 0038 01/18/17 1503 01/19/17 0551  WBC 4.1 3.5* 4.0  NEUTROABS 3.1  --   --   HGB 14.7 14.3 14.3  HCT 42.3 41.7 41.2  MCV 87.0 85.8 85.8  PLT 133* 121* [id number]*   Basic Metabolic Panel:  Recent Labs Lab 01/18/17 0038 01/18/17 1503 01/19/17 0551  NA 133*  --  137  K 4.2  --  4.7  CL 98*  --  100*  CO2 28  --  28  GLUCOSE 107*  --  140*  BUN 7  --  15  CREATININE 0.84 0.88 0.73  CALCIUM 9.1  --  8.7*   GFR: Estimated Creatinine Clearance: 112.7 mL/min (by C-G formula based on SCr of 0.73 mg/dL). Liver Function Tests:  Recent Labs Lab 01/18/17 0038  AST 13*  ALT 12*  ALKPHOS 72  BILITOT 0.4  PROT 7.0  ALBUMIN 4.3     Recent Results (from the past 240 hour(s))  Culture, blood (Routine X 2) w Reflex to ID Panel     Status: None (Preliminary result)   Collection Time: 01/18/17 12:30 AM  Result Value Ref Range Status   Specimen Description BLOOD RIGHT FOREARM  Final   Special Requests   Final    BOTTLES DRAWN AEROBIC AND ANAEROBIC AER 5cC ANA 5cc   Culture   Final    NO GROWTH 3 DAYS Performed at [facility], [place] [address] [place], [place] [address]    Report Status PENDING  Incomplete  Culture, blood (Routine X 2) w Reflex to ID Panel     Status: None (Preliminary result)   Collection Time: 01/18/17 12:40 AM  Result Value Ref Range Status   Specimen Description BLOOD LEFT HAND  Final   Special Requests IN PEDIATRIC BOTTLE 3cc  Final   Culture   Final    NO GROWTH 3 DAYS Performed at [facility], [place] [address], [place], [place] [address]    Report Status PENDING  Incomplete  Respiratory Panel by PCR     Status: Abnormal   Collection Time: 01/18/17  2:49 PM  Result  Value Ref Range Status   Adenovirus NOT DETECTED NOT DETECTED Final   Coronavirus 229E NOT DETECTED NOT DETECTED Final   Coronavirus HKU1 NOT DETECTED NOT DETECTED Final   Coronavirus NL63 NOT DETECTED NOT DETECTED Final   Coronavirus OC43 NOT DETECTED NOT DETECTED Final   Metapneumovirus NOT DETECTED NOT DETECTED Final   Rhinovirus /  Enterovirus NOT DETECTED NOT DETECTED Final   Influenza A H3 DETECTED (A) NOT DETECTED Final   Influenza B NOT DETECTED NOT DETECTED Final   Parainfluenza Virus 1 NOT DETECTED NOT DETECTED Final   Parainfluenza Virus 2 NOT DETECTED NOT DETECTED Final   Parainfluenza Virus 3 NOT DETECTED NOT DETECTED Final   Parainfluenza Virus 4 NOT DETECTED NOT DETECTED Final   Respiratory Syncytial Virus NOT DETECTED NOT DETECTED Final   Bordetella pertussis NOT DETECTED NOT DETECTED Final   Chlamydophila pneumoniae NOT DETECTED NOT DETECTED Final   Mycoplasma pneumoniae NOT DETECTED NOT DETECTED Final    Comment: Performed at [facility], [place] [address], [place], [place] [address]     Radiology Studies: Dg Chest 2 View  Result Date: 01/19/2017 CLINICAL DATA:  Shortness of breath and fever beginning today. History of asthma. EXAM: CHEST  2 VIEW COMPARISON:  01/17/2017.  05/24/2016. FINDINGS: Heart size is normal. Chronic ectasia of the aorta. Chronic interstitial lung markings and bronchial thickening. Patchy acute pneumonia and volume loss in the lower lobes left more than right. Upper lungs do not show any acute finding. Bony structures are unremarkable. IMPRESSION: Chronic interstitial lung disease. Bronchial thickening. Patchy acute infiltrates in the lower lobes, left more than right. Electronically Signed   By: Nelson Chimes M.D.   On: 01/19/2017 14:40    Scheduled Meds: . amLODipine  5 mg Oral Daily  . dextromethorphan-guaiFENesin  1 tablet Oral BID  . enoxaparin (Kyle Blair) injection  40 mg Subcutaneous Q24H  . gabapentin  300 mg Oral QID  .  ipratropium-albuterol  3 mL Nebulization TID  . levofloxacin  750 mg Oral Q24H  . levothyroxine  200 mcg Oral QAC breakfast  . mometasone-formoterol  2 puff Inhalation BID  . montelukast  10 mg Oral Daily  . oseltamivir  75 mg Oral BID  . oxyCODONE  30 mg Oral Q12H  . pantoprazole  40 mg Oral Daily  . pravastatin  40 mg Oral q1800  . predniSONE  40 mg Oral Q breakfast  . tamsulosin  0.4 mg Oral Daily   Continuous Infusions:   LOS: 3 days    Time spent: 35minutes    Gilles Chiquito, MD [facility] Pager [phone]  If 7PM-7AM, please contact night-coverage [email or web address] Password [facility] 01/21/2017, 10:57 AM

## 2017-01-23 ENCOUNTER — Telehealth: Payer: Self-pay

## 2017-01-23 ENCOUNTER — Telehealth: Payer: Self-pay | Admitting: Family

## 2017-01-23 LAB — CULTURE, BLOOD (ROUTINE X 2)
CULTURE: NO GROWTH
CULTURE: NO GROWTH

## 2017-01-23 NOTE — Telephone Encounter (Signed)
Can you please clarify what he meant when he said "worried that ABT may have need too soon." and also need to know what symptoms he is having currently, response says "feels bad."

## 2017-01-23 NOTE — Telephone Encounter (Addendum)
Relation to [id number] Call back [facility] [facility] [id number] [phone] (Phone) [phone] (Fax)     Reason for call:  Patient was seen in the ED dx with double pneumonia and would like NP to prescribe A/B to hold him over until Friday 01/27/17/

## 2017-01-23 NOTE — Telephone Encounter (Signed)
Please advise pt that I reviewed his medication and dosages and the antibiotics that he was prescribed at discharge (2 additional days of levaquin) was adequate.  He should not need further antibiotics.

## 2017-01-23 NOTE — Telephone Encounter (Signed)
Notified pt and he voices understanding. 

## 2017-01-23 NOTE — Telephone Encounter (Addendum)
01/23/17   Transition Care Management Follow-up Telephone Call  ADMISSION DATE: 01/18/17  DISCHARGE DATE: 01/21/17    How have you been since you were released from the hospital? Patient states he has been feeling bad.     Do you understand why you were in the hospital? YES   Do you understand the discharge instrcutions? Yes  Items Reviewed:  Medications reviewed: Reviewed medications with patient.  Allergies reviewed: Aspirin and Motrin Referrals reviewed: Appointment scheduled  Functional Questionnaire:   Activities of Daily Living (ADLs):  Needs a little assistance at this time because he is weak per patient.    Transportation issues/concerns?: No   Any patient concerns? Worried that ABT may have ended too soon.    Diet: Regular  Confirmed importance of follow up. Yes    Confirmed with patient if condition begins to worsen call PCP or go to the ER. Yes    Patient was given the [place] line [phone]: Yes

## 2017-01-23 NOTE — Telephone Encounter (Signed)
TCM call made to patient. Appointment scheduled for 01/27/17

## 2017-01-24 NOTE — Telephone Encounter (Signed)
Sorry corrected not. Called patient who states he spoke with you and nurse last evening.

## 2017-01-27 ENCOUNTER — Ambulatory Visit (INDEPENDENT_AMBULATORY_CARE_PROVIDER_SITE_OTHER): Payer: Medicare Other | Admitting: Family

## 2017-01-27 ENCOUNTER — Encounter: Payer: Self-pay | Admitting: Family

## 2017-01-27 VITALS — BP 145/86 | HR 79 | Temp 97.7°F | Ht 66.0 in | Wt 217.0 lb

## 2017-01-27 DIAGNOSIS — J111 Influenza due to unidentified influenza virus with other respiratory manifestations: Secondary | ICD-10-CM

## 2017-01-27 DIAGNOSIS — J45901 Unspecified asthma with (acute) exacerbation: Secondary | ICD-10-CM

## 2017-01-27 DIAGNOSIS — J1000 Influenza due to other identified influenza virus with unspecified type of pneumonia: Secondary | ICD-10-CM

## 2017-01-27 DIAGNOSIS — J09X1 Influenza due to identified novel influenza A virus with pneumonia: Secondary | ICD-10-CM

## 2017-01-27 MED ORDER — LOVASTATIN 40 MG PO TABS: ORAL_TABLET | ORAL | 1 refills | Status: DC

## 2017-01-27 NOTE — Patient Instructions (Signed)
Please follow up as scheduled.

## 2017-01-27 NOTE — Progress Notes (Signed)
Subjective:    Patient ID: Kyle Blair, male    DOB: [birth year], 56 y.o.   MRN: [id number]  HPI    Kyle Blair is a 56 yr old male who presents today for hospital follow up.  Discharge summary is reviewed.  Pt was hospitalized 01/18/17-01/21/17 with flu, asthma, and acute asthma exacerbation. He completed his steroid taper and Levaquin. He reports feeling much better.  He using mucinex which is helping him get the sputum. Denies fever since he returned home. Notes that has been sleeping a lot since he got home.  Overall feeling much better.    Review of Systems    see HPI  Past Medical History:  Diagnosis Date  . Arthritis   . Asthma   . BPH (benign prostatic hypertrophy)   . GERD with stricture   . History of stroke 2004  . Hyperlipidemia   . Hypertension   . Hypothyroid   . Lung nodule 02/03/2012  . Polycystic kidney disease   . Stroke [facility])      Social History   Social History  . Marital status: Married    Spouse name: N/A  . Number of children: 2  . Years of education: N/A   Occupational History  . DISABLED Unemployed   Social History Main Topics  . Smoking status: Current Every Day Smoker    Packs/day: 0.00    Years: 20.00    Types: Cigarettes  . Smokeless tobacco: Never Used  . Alcohol use No  . Drug use: No  . Sexual activity: Not on file   Other Topics Concern  . Not on file   Social History Narrative   Caffeine use:  1 pot coffee daily, 1 glass tea   Regular exercise:  No. Has active lifestyle.   2 biological and 1 stepson.   Former smoker- quit in 2007   He is on disability- reports that he has a history of heat stroke.               Past Surgical History:  Procedure Laterality Date  . CARPAL TUNNEL RELEASE    . COLONOSCOPY    . HERNIA REPAIR  2005   with mesh  . KNEE ARTHROSCOPY Bilateral 12/05/13   cyst and bone spur removed from left knee per pt.  Marland Kitchen KNEE ARTHROSCOPY Left 01/2015  . NASAL POLYP EXCISION  1992 or 93  .  POLYPECTOMY    . TOTAL KNEE ARTHROPLASTY Right 05/19/2015   Procedure: RIGHT TOTAL KNEE ARTHROPLASTY;  Surgeon: Paralee Cancel, MD;  Location: [facility];  Service: Orthopedics;  Laterality: Right;    Family History  Problem Relation Age of Onset  . Heart disease Mother     pacemaker  . Diabetes Father   . Kidney disease Father   . Diabetes Sister   . Kidney disease Sister   . Colon cancer Neg Hx   . Esophageal cancer Neg Hx   . Rectal cancer Neg Hx   . Stomach cancer Neg Hx   . Prostate cancer Neg Hx   . Pancreatic cancer Neg Hx     Allergies  Allergen Reactions  . Aspirin Anaphylaxis  . Doxycycline Hyclate Swelling    Facial Swelling  . Ibuprofen Anaphylaxis  . Peanut Butter Flavor Anaphylaxis  . Cephalexin Rash    Current Outpatient Prescriptions on File Prior to Visit  Medication Sig Dispense Refill  . acetaminophen (TYLENOL) 325 MG tablet Take 1-2 tablets (325-650 mg total) by mouth every 6 (six) hours  as needed.    Marland Kitchen ADVAIR DISKUS 250-50 MCG/DOSE AEPB INHALE 1 PUFF INTO THE LUNGS TWICE DAILY 60 each 5  . albuterol (PROVENTIL) (2.5 MG/3ML) 0.083% nebulizer solution Take 3 mLs (2.5 mg total) by nebulization every 6 (six) hours as needed for wheezing or shortness of breath. 75 mL 12  . amLODipine (NORVASC) 5 MG tablet TAKE ONE (1) TABLET EACH DAY 90 tablet 1  . benazepril (LOTENSIN) 10 MG tablet TAKE 1 TABLET(10 MG) BY MOUTH DAILY 30 tablet 5  . dextromethorphan-guaiFENesin (MUCINEX DM) 30-600 MG 12hr tablet Take 1 tablet by mouth 2 (two) times daily. 30 tablet 0  . diphenhydrAMINE (BENADRYL) 25 MG tablet Take 25 mg by mouth at bedtime as needed for sleep.     Marland Kitchen docusate sodium (COLACE) 100 MG capsule Take 1 capsule (100 mg total) by mouth daily. (Patient taking differently: Take 100 mg by mouth daily as needed. ) 10 capsule 0  . furosemide (LASIX) 20 MG tablet Take 1 tablet (20 mg total) by mouth daily as needed. 3 tablet 0  . gabapentin (NEURONTIN) 300 MG capsule Take 300 mg  by mouth 3 (three) times daily.   0  . levofloxacin (LEVAQUIN) 750 MG tablet Take 1 tablet (750 mg total) by mouth daily. 2 tablet 0  . levothyroxine (SYNTHROID) 200 MCG tablet Take 1 tablet (200 mcg total) by mouth daily before breakfast. 30 tablet 2  . lovastatin (MEVACOR) 40 MG tablet TAKE ONE (1) TABLET AT BEDTIME 90 tablet 1  . methocarbamol (ROBAXIN) 500 MG tablet Take 1 tablet (500 mg total) by mouth every 6 (six) hours as needed for muscle spasms. 50 tablet 0  . montelukast (SINGULAIR) 10 MG tablet TAKE ONE TABLET DAILY IN THE MORNING 90 tablet 1  . omeprazole (PRILOSEC) 40 MG capsule TAKE ONE CAPSULE BY MOUTH DAILY 90 capsule 0  . oseltamivir (TAMIFLU) 75 MG capsule Take 1 capsule (75 mg total) by mouth 2 (two) times daily. 3 capsule 0  . Oxycodone HCl 10 MG TABS Take 10 mg by mouth 3 (three) times daily as needed (pain.).   0  . OXYCONTIN 30 MG 12 hr tablet Take 30 mg by mouth 2 (two) times daily.  0  . predniSONE (DELTASONE) 10 MG tablet Take 50mg  (5tab) for 5 days, 40mg  (4 tab) for 3 days and 30mg  (3 tab) for 3 days then stop. 52 tablet 0  . tamsulosin (FLOMAX) 0.4 MG CAPS capsule TAKE ONE CAPSULE BY MOUTH EVERY NIGHT AT BEDTIME (Patient taking differently: TAKE ONE CAPSULE BY MOUTH EVERY DAY) 90 capsule 0  . VENTOLIN HFA 108 (90 Base) MCG/ACT inhaler INHALE 2 PUFFS INTO THE LUNGS EVERY 4 HOURS AS NEEDED FOR WHEEZING ORSHORTNESS OF BREATH 18 g 3   No current facility-administered medications on file prior to visit.     BP (!) 145/86 (BP Location: Right Arm, Cuff Size: Normal)   Pulse 79   Temp 97.7 F (36.5 C) (Oral)   Ht 5\' 6"  (1.676 m)   Wt 217 lb (98.4 kg)   SpO2 93%   BMI 35.02 kg/m    Objective:   Physical Exam  Constitutional: He is oriented to person, place, and time. He appears well-developed and well-nourished. No distress.  HENT:  Head: Normocephalic and atraumatic.  Cardiovascular: Normal rate and regular rhythm.   No murmur heard. Pulmonary/Chest: Effort  normal and breath sounds normal. No respiratory distress. He has no wheezes. He has no rales.  Musculoskeletal: He exhibits no edema.  Neurological: He is alert and oriented to person, place, and time.  Skin: Skin is warm and dry.  Psychiatric: He has a normal mood and affect. His behavior is normal. Thought content normal.          Assessment & Plan:  Pneumonia- clinically resolved.  He has a follow up appointment on 2/16 and we will plan to repeat his chest xray at that time.   Influenza- resolved.   Acute asthma exacerbation- continue albuterol prn

## 2017-01-27 NOTE — Progress Notes (Signed)
Pre visit review using our clinic review tool, if applicable. No additional management support is needed unless otherwise documented below in the visit note. 

## 2017-02-10 ENCOUNTER — Ambulatory Visit (INDEPENDENT_AMBULATORY_CARE_PROVIDER_SITE_OTHER): Payer: Medicare Other | Admitting: Family

## 2017-02-10 ENCOUNTER — Encounter: Payer: Self-pay | Admitting: Family

## 2017-02-10 VITALS — BP 135/95 | HR 95 | Temp 98.4°F | Ht 66.0 in | Wt 216.0 lb

## 2017-02-10 DIAGNOSIS — J189 Pneumonia, unspecified organism: Secondary | ICD-10-CM | POA: Diagnosis not present

## 2017-02-10 NOTE — Patient Instructions (Signed)
Please complete lab work prior to leaving and chest x-ray on the first floor.   

## 2017-02-10 NOTE — Progress Notes (Signed)
Pre visit review using our clinic review tool, if applicable. No additional management support is needed unless otherwise documented below in the visit note. 

## 2017-02-10 NOTE — Progress Notes (Signed)
Subjective:    Patient ID: Kyle Blair, male    DOB: Jun 07, 1961, 56 y.o.   MRN: [id number]  HPI  Kyle Blair is a 56 yr old male who presents today for follow up. Reports that he had some sweats on Tuesday night. He denies dysuria.    BP Readings from Last 3 Encounters:  02/10/17 (!) 135/95  01/27/17 (!) 145/86  01/21/17 (!) 133/93     Review of Systems See HPI  Past Medical History:  Diagnosis Date  . Arthritis   . Asthma   . BPH (benign prostatic hypertrophy)   . GERD with stricture   . History of stroke 2004  . Hyperlipidemia   . Hypertension   . Hypothyroid   . Lung nodule 02/03/2012  . Polycystic kidney disease   . Stroke [facility])      Social History   Social History  . Marital status: Married    Spouse name: N/A  . Number of children: 2  . Years of education: N/A   Occupational History  . DISABLED Unemployed   Social History Main Topics  . Smoking status: Current Every Day Smoker    Packs/day: 6.00    Years: 20.00    Types: Cigarettes  . Smokeless tobacco: Never Used  . Alcohol use No  . Drug use: No  . Sexual activity: Not on file   Other Topics Concern  . Not on file   Social History Narrative   Caffeine use:  1 pot coffee daily, 1 glass tea   Regular exercise:  No. Has active lifestyle.   2 biological and 1 stepson.   Former smoker- quit in 2007   He is on disability- reports that he has a history of heat stroke.               Past Surgical History:  Procedure Laterality Date  . CARPAL TUNNEL RELEASE    . COLONOSCOPY    . HERNIA REPAIR  2005   with mesh  . KNEE ARTHROSCOPY Bilateral 12/05/13   cyst and bone spur removed from left knee per pt.  Marland Kitchen KNEE ARTHROSCOPY Left 01/2015  . NASAL POLYP EXCISION  1992 or 93  . POLYPECTOMY    . TOTAL KNEE ARTHROPLASTY Right 05/19/2015   Procedure: RIGHT TOTAL KNEE ARTHROPLASTY;  Surgeon: Paralee Cancel, MD;  Location: [facility];  Service: Orthopedics;  Laterality: Right;    Family History    Problem Relation Age of Onset  . Heart disease Mother     pacemaker  . Diabetes Father   . Kidney disease Father   . Diabetes Sister   . Kidney disease Sister   . Colon cancer Neg Hx   . Esophageal cancer Neg Hx   . Rectal cancer Neg Hx   . Stomach cancer Neg Hx   . Prostate cancer Neg Hx   . Pancreatic cancer Neg Hx     Allergies  Allergen Reactions  . Aspirin Anaphylaxis  . Doxycycline Hyclate Swelling    Facial Swelling  . Ibuprofen Anaphylaxis  . Peanut Butter Flavor Anaphylaxis  . Cephalexin Rash    Current Outpatient Prescriptions on File Prior to Visit  Medication Sig Dispense Refill  . acetaminophen (TYLENOL) 325 MG tablet Take 1-2 tablets (325-650 mg total) by mouth every 6 (six) hours as needed.    Marland Kitchen ADVAIR DISKUS 250-50 MCG/DOSE AEPB INHALE 1 PUFF INTO THE LUNGS TWICE DAILY 60 each 5  . albuterol (PROVENTIL) (2.5 MG/3ML) 0.083% nebulizer solution Take 3  mLs (2.5 mg total) by nebulization every 6 (six) hours as needed for wheezing or shortness of breath. 75 mL 12  . amLODipine (NORVASC) 5 MG tablet TAKE ONE (1) TABLET EACH DAY 90 tablet 1  . benazepril (LOTENSIN) 10 MG tablet TAKE 1 TABLET(10 MG) BY MOUTH DAILY 30 tablet 5  . dextromethorphan-guaiFENesin (MUCINEX DM) 30-600 MG 12hr tablet Take 1 tablet by mouth 2 (two) times daily. 30 tablet 0  . diphenhydrAMINE (BENADRYL) 25 MG tablet Take 25 mg by mouth at bedtime as needed for sleep.     Marland Kitchen docusate sodium (COLACE) 100 MG capsule Take 1 capsule (100 mg total) by mouth daily. (Patient taking differently: Take 100 mg by mouth daily as needed. ) 10 capsule 0  . furosemide (LASIX) 20 MG tablet Take 1 tablet (20 mg total) by mouth daily as needed. 3 tablet 0  . gabapentin (NEURONTIN) 300 MG capsule Take 300 mg by mouth 3 (three) times daily.   0  . levothyroxine (SYNTHROID) 200 MCG tablet Take 1 tablet (200 mcg total) by mouth daily before breakfast. 30 tablet 2  . lovastatin (MEVACOR) 40 MG tablet TAKE ONE (1) TABLET  AT BEDTIME 90 tablet 1  . methocarbamol (ROBAXIN) 500 MG tablet Take 1 tablet (500 mg total) by mouth every 6 (six) hours as needed for muscle spasms. 50 tablet 0  . montelukast (SINGULAIR) 10 MG tablet TAKE ONE TABLET DAILY IN THE MORNING 90 tablet 1  . omeprazole (PRILOSEC) 40 MG capsule TAKE ONE CAPSULE BY MOUTH DAILY 90 capsule 0  . Oxycodone HCl 10 MG TABS Take 10 mg by mouth 3 (three) times daily as needed (pain.).   0  . OXYCONTIN 30 MG 12 hr tablet Take 30 mg by mouth 2 (two) times daily.  0  . tamsulosin (FLOMAX) 0.4 MG CAPS capsule TAKE ONE CAPSULE BY MOUTH EVERY NIGHT AT BEDTIME (Patient taking differently: TAKE ONE CAPSULE BY MOUTH EVERY DAY) 90 capsule 0  . VENTOLIN HFA 108 (90 Base) MCG/ACT inhaler INHALE 2 PUFFS INTO THE LUNGS EVERY 4 HOURS AS NEEDED FOR WHEEZING ORSHORTNESS OF BREATH 18 g 3   No current facility-administered medications on file prior to visit.     BP (!) 135/95 (BP Location: Right Arm, Patient Position: Sitting, Cuff Size: Large)   Pulse 95   Temp 98.4 F (36.9 C) (Oral)   Ht 5\' 6"  (1.676 m)   Wt 216 lb (98 kg)   SpO2 95%   BMI 34.86 kg/m       Objective:   Physical Exam  Constitutional: He is oriented to person, place, and time. He appears well-developed and well-nourished. No distress.  HENT:  Head: Normocephalic and atraumatic.  Cardiovascular: Normal rate and regular rhythm.   No murmur heard. Pulmonary/Chest: Effort normal and breath sounds normal. No respiratory distress. He has no wheezes. He has no rales.  Musculoskeletal: He exhibits no edema.  Neurological: He is alert and oriented to person, place, and time.  Skin: Skin is warm and dry.  Psychiatric: He has a normal mood and affect. His behavior is normal. Thought content normal.          Assessment & Plan:  Pneumonia- clinically resolved. Will obtain follow up cxr to ensure resolution. I am not clear why he has been sweating.  He is afebrile. Will obtain CBC and UA to further  evaluate.

## 2017-02-20 ENCOUNTER — Encounter: Payer: Self-pay | Admitting: Family

## 2017-02-20 ENCOUNTER — Ambulatory Visit (INDEPENDENT_AMBULATORY_CARE_PROVIDER_SITE_OTHER): Payer: Medicare Other | Admitting: Family

## 2017-02-20 ENCOUNTER — Telehealth: Payer: Self-pay | Admitting: Family

## 2017-02-20 ENCOUNTER — Ambulatory Visit (HOSPITAL_BASED_OUTPATIENT_CLINIC_OR_DEPARTMENT_OTHER)
Admission: RE | Admit: 2017-02-20 | Discharge: 2017-02-20 | Disposition: A | Discharge status: Home / Self Care | Payer: Medicare Other | Source: Ambulatory Visit | Attending: Family | Admitting: Family

## 2017-02-20 VITALS — BP 136/84 | HR 85 | Temp 98.3°F | Resp 16 | Ht 66.0 in | Wt 227.0 lb

## 2017-02-20 DIAGNOSIS — J189 Pneumonia, unspecified organism: Secondary | ICD-10-CM

## 2017-02-20 DIAGNOSIS — Z8701 Personal history of pneumonia (recurrent): Secondary | ICD-10-CM | POA: Diagnosis not present

## 2017-02-20 DIAGNOSIS — J4541 Moderate persistent asthma with (acute) exacerbation: Secondary | ICD-10-CM

## 2017-02-20 DIAGNOSIS — J45901 Unspecified asthma with (acute) exacerbation: Secondary | ICD-10-CM | POA: Diagnosis not present

## 2017-02-20 MED ORDER — PREDNISONE 10 MG PO TABS: ORAL_TABLET | ORAL | 0 refills | Status: DC

## 2017-02-20 MED ORDER — METHYLPREDNISOLONE SODIUM SUCC 125 MG IJ SOLR
125.0000 mg | Freq: Once | INTRAMUSCULAR | Status: AC
  Administered 2017-02-20: 125 mg via INTRAMUSCULAR

## 2017-02-20 MED FILL — predniSONE 10 MG TABS: 10 | 8 days supply | Qty: 20 | Fill #0

## 2017-02-20 NOTE — Telephone Encounter (Signed)
Patient has appointment today with Elby Beck, NP.

## 2017-02-20 NOTE — Progress Notes (Signed)
Pre visit review using our clinic review tool, if applicable. No additional management support is needed unless otherwise documented below in the visit note. 

## 2017-02-20 NOTE — Addendum Note (Signed)
Addended by: Kelle Darting A on: 02/20/2017 04:26 PM   Modules accepted: Orders

## 2017-02-20 NOTE — Progress Notes (Signed)
Subjective:    Patient ID: Nyra Jabs, male    DOB: 06/28/1961, 56 y.o.   MRN: [id number]  HPI  Mr. Aronson is a 56 yr old male who presents today with chief complaint of tightness in his breathing and wheezing. Symptoms began 3 days ago. He was recently hospitalized for flu/PNA. Reports that the whole weekend he was "tight and wheezing." Reports no relief with albuterol.    Review of Systems See HPI  Past Medical History:  Diagnosis Date  . Arthritis   . Asthma   . BPH (benign prostatic hypertrophy)   . GERD with stricture   . History of stroke 2004  . Hyperlipidemia   . Hypertension   . Hypothyroid   . Lung nodule 02/03/2012  . Polycystic kidney disease   . Stroke [facility])      Social History   Social History  . Marital status: Married    Spouse name: N/A  . Number of children: 2  . Years of education: N/A   Occupational History  . DISABLED Unemployed   Social History Main Topics  . Smoking status: Current Every Day Smoker    Packs/day: 6.00    Years: 20.00    Types: Cigarettes  . Smokeless tobacco: Never Used  . Alcohol use No  . Drug use: No  . Sexual activity: Not on file   Other Topics Concern  . Not on file   Social History Narrative   Caffeine use:  1 pot coffee daily, 1 glass tea   Regular exercise:  No. Has active lifestyle.   2 biological and 1 stepson.   Former smoker- quit in 2007   He is on disability- reports that he has a history of heat stroke.               Past Surgical History:  Procedure Laterality Date  . CARPAL TUNNEL RELEASE    . COLONOSCOPY    . HERNIA REPAIR  2005   with mesh  . KNEE ARTHROSCOPY Bilateral 12/05/13   cyst and bone spur removed from left knee per pt.  Marland Kitchen KNEE ARTHROSCOPY Left 01/2015  . NASAL POLYP EXCISION  1992 or 93  . POLYPECTOMY    . TOTAL KNEE ARTHROPLASTY Right 05/19/2015   Procedure: RIGHT TOTAL KNEE ARTHROPLASTY;  Surgeon: Paralee Cancel, MD;  Location: [facility];  Service: Orthopedics;   Laterality: Right;    Family History  Problem Relation Age of Onset  . Heart disease Mother     pacemaker  . Diabetes Father   . Kidney disease Father   . Diabetes Sister   . Kidney disease Sister   . Colon cancer Neg Hx   . Esophageal cancer Neg Hx   . Rectal cancer Neg Hx   . Stomach cancer Neg Hx   . Prostate cancer Neg Hx   . Pancreatic cancer Neg Hx     Allergies  Allergen Reactions  . Aspirin Anaphylaxis  . Doxycycline Hyclate Swelling    Facial Swelling  . Ibuprofen Anaphylaxis  . Peanut Butter Flavor Anaphylaxis  . Cephalexin Rash    Current Outpatient Prescriptions on File Prior to Visit  Medication Sig Dispense Refill  . acetaminophen (TYLENOL) 325 MG tablet Take 1-2 tablets (325-650 mg total) by mouth every 6 (six) hours as needed.    Marland Kitchen ADVAIR DISKUS 250-50 MCG/DOSE AEPB INHALE 1 PUFF INTO THE LUNGS TWICE DAILY 60 each 5  . albuterol (PROVENTIL) (2.5 MG/3ML) 0.083% nebulizer solution Take 3 mLs (2.5  mg total) by nebulization every 6 (six) hours as needed for wheezing or shortness of breath. 75 mL 12  . amLODipine (NORVASC) 5 MG tablet TAKE ONE (1) TABLET EACH DAY 90 tablet 1  . benazepril (LOTENSIN) 10 MG tablet TAKE 1 TABLET(10 MG) BY MOUTH DAILY 30 tablet 5  . diphenhydrAMINE (BENADRYL) 25 MG tablet Take 25 mg by mouth at bedtime as needed for sleep.     . furosemide (LASIX) 20 MG tablet Take 1 tablet (20 mg total) by mouth daily as needed. 3 tablet 0  . gabapentin (NEURONTIN) 300 MG capsule Take 300 mg by mouth 3 (three) times daily.   0  . levothyroxine (SYNTHROID) 200 MCG tablet Take 1 tablet (200 mcg total) by mouth daily before breakfast. 30 tablet 2  . lovastatin (MEVACOR) 40 MG tablet TAKE ONE (1) TABLET AT BEDTIME 90 tablet 1  . methocarbamol (ROBAXIN) 500 MG tablet Take 1 tablet (500 mg total) by mouth every 6 (six) hours as needed for muscle spasms. 50 tablet 0  . montelukast (SINGULAIR) 10 MG tablet TAKE ONE TABLET DAILY IN THE MORNING 90 tablet 1  .  omeprazole (PRILOSEC) 40 MG capsule TAKE ONE CAPSULE BY MOUTH DAILY 90 capsule 0  . Oxycodone HCl 10 MG TABS Take 10 mg by mouth 3 (three) times daily as needed (pain.).   0  . OXYCONTIN 30 MG 12 hr tablet Take 30 mg by mouth 2 (two) times daily.  0  . tamsulosin (FLOMAX) 0.4 MG CAPS capsule TAKE ONE CAPSULE BY MOUTH EVERY NIGHT AT BEDTIME (Patient taking differently: TAKE ONE CAPSULE BY MOUTH EVERY DAY) 90 capsule 0  . VENTOLIN HFA 108 (90 Base) MCG/ACT inhaler INHALE 2 PUFFS INTO THE LUNGS EVERY 4 HOURS AS NEEDED FOR WHEEZING ORSHORTNESS OF BREATH 18 g 3   No current facility-administered medications on file prior to visit.     BP 136/84 (BP Location: Right Arm, Cuff Size: Normal)   Pulse 85   Temp 98.3 F (36.8 C) (Oral)   Resp 16   Ht 5\' 6"  (1.676 m)   Wt 227 lb (103 kg)   SpO2 96% Comment: room air  BMI 36.64 kg/m       Objective:   Physical Exam  Constitutional: He is oriented to person, place, and time. He appears well-developed and well-nourished. No distress.  HENT:  Head: Normocephalic and atraumatic.  Cardiovascular: Normal rate and regular rhythm.   No murmur heard. Pulmonary/Chest: Effort normal. No respiratory distress. He has wheezes. He has no rales.  Musculoskeletal: He exhibits no edema.  Neurological: He is alert and oriented to person, place, and time.  Skin: Skin is warm and dry.  Psychiatric: He has a normal mood and affect. His behavior is normal. Thought content normal.          Assessment & Plan:  Asthma with acute exacerbation- He did not complete chest x ray last visit to determine resolution of his PNA. Will have pt complete lab work and chest xray. IM solumedrol given today in the office which will be followed by a prednisone taper. Consider addition of abx after evaluation of CXR results.

## 2017-02-20 NOTE — Telephone Encounter (Signed)
Patient Name: Kyle Blair  DOB: 1961-01-14    Initial Comment Caller states his asthma is all tightened up in his chest    Nurse Assessment  Nurse: Raphael Gibney, RN, Vanita Ingles Date/Time (Eastern Time): 02/20/2017 10:12:07 AM  Confirm and document reason for call. If symptomatic, describe symptoms. ---Caller states he has asthma. has tightness in his chest. He was hospitalized for flu and pneumonia and felt fine the past 2 weeks. No fever. No green mucus. Started having chest tightness over the weekend.  Does the patient have any new or worsening symptoms? ---Yes  Will a triage be completed? ---Yes  Related visit to physician within the last 2 weeks? ---No  Does the PT have any chronic conditions? (i.e. diabetes, asthma, etc.) ---Yes  List chronic conditions. ---asthma  Is this a behavioral health or substance abuse call? ---No     Guidelines    Guideline Title Affirmed Question Affirmed Notes  Asthma Attack Asthma medicine (nebulizer or inhaler) is needed more frequently than q 4 hours to keep you comfortable    Final Disposition User   Clinical Call Raphael Gibney, RN, Vanita Ingles    Comments  Pt did not want to finish triage as he states he is educated and he has had asthma all his life and know when he has to go to the ER.  pt states he has appt at 2:45 pm today with [name]

## 2017-02-20 NOTE — Patient Instructions (Addendum)
Please complete lab work prior to leaving. Complete chest x-ray on the first floor.  Begin prednisone taper tomorrow AM.  Go to the ER if your shortness of breath worsens. Continue your nebulizer every 4-6 hours.

## 2017-02-20 NOTE — Telephone Encounter (Signed)
Pt called in to schedule an appt with his PCP. Pt says that over the weekend he had some tightening in his chest. Pt is asthmatic. Scheduled pt an appt. Also transferred pt to team health for triaging.

## 2017-02-20 NOTE — Telephone Encounter (Signed)
Noted.  Message routed to [facility] for [place].

## 2017-02-21 ENCOUNTER — Other Ambulatory Visit: Payer: Self-pay | Admitting: Family

## 2017-02-21 LAB — URINALYSIS W MICROSCOPIC + REFLEX CULTURE
BACTERIA UA: NONE SEEN [HPF]
BILIRUBIN URINE: NEGATIVE
Casts: NONE SEEN [LPF]
Crystals: NONE SEEN [HPF]
GLUCOSE, UA: NEGATIVE
Hgb urine dipstick: NEGATIVE
Ketones, ur: NEGATIVE
LEUKOCYTES UA: NEGATIVE
Nitrite: NEGATIVE
Protein, ur: NEGATIVE
RBC / HPF: NONE SEEN RBC/HPF (ref <=2)
SQUAMOUS EPITHELIAL / LPF: NONE SEEN [HPF] (ref <=5)
Specific Gravity, Urine: 1.011 (ref 1.001–1.035)
WBC UA: NONE SEEN WBC/HPF (ref <=5)
Yeast: NONE SEEN [HPF]
pH: 7 (ref 5.0–8.0)

## 2017-02-21 LAB — CBC WITH DIFFERENTIAL/PLATELET
Basophils Absolute: 0 10*3/uL (ref 0.0–0.1)
Basophils Relative: 0.3 % (ref 0.0–3.0)
Eosinophils Absolute: 0 10*3/uL (ref 0.0–0.7)
Eosinophils Relative: 0.1 % (ref 0.0–5.0)
HEMATOCRIT: 42.1 % (ref 39.0–52.0)
HEMOGLOBIN: 14.4 g/dL (ref 13.0–17.0)
LYMPHS PCT: 26.2 % (ref 12.0–46.0)
Lymphs Abs: 1.2 10*3/uL (ref 0.7–4.0)
MCHC: 34.3 g/dL (ref 30.0–36.0)
MCV: 88.9 fl (ref 78.0–100.0)
MONOS PCT: 10.9 % (ref 3.0–12.0)
Monocytes Absolute: 0.5 10*3/uL (ref 0.1–1.0)
NEUTROS PCT: 62.5 % (ref 43.0–77.0)
Neutro Abs: 2.8 10*3/uL (ref 1.4–7.7)
Platelets: 162 10*3/uL (ref 150.0–400.0)
RBC: 4.73 Mil/uL (ref 4.22–5.81)
RDW: 14 % (ref 11.5–15.5)
WBC: 4.5 10*3/uL (ref 4.0–10.5)

## 2017-02-27 ENCOUNTER — Other Ambulatory Visit: Payer: Self-pay | Admitting: Family

## 2017-02-27 ENCOUNTER — Ambulatory Visit (INDEPENDENT_AMBULATORY_CARE_PROVIDER_SITE_OTHER): Payer: Medicare Other | Admitting: Family

## 2017-02-27 VITALS — BP 129/77 | HR 85 | Temp 98.5°F | Resp 16 | Ht 66.0 in | Wt 229.2 lb

## 2017-02-27 DIAGNOSIS — E039 Hypothyroidism, unspecified: Secondary | ICD-10-CM

## 2017-02-27 DIAGNOSIS — J45909 Unspecified asthma, uncomplicated: Secondary | ICD-10-CM

## 2017-02-27 LAB — TSH: TSH: 15.31 u[IU]/mL — ABNORMAL HIGH (ref 0.35–4.50)

## 2017-02-27 MED ORDER — OMEPRAZOLE 40 MG PO CPDR: 40.0000 mg | DELAYED_RELEASE_CAPSULE | Freq: Every day | ORAL | 1 refills | Status: DC

## 2017-02-27 MED ORDER — TAMSULOSIN HCL 0.4 MG PO CAPS: 0.4000 mg | ORAL_CAPSULE | Freq: Every day | ORAL | 1 refills | Status: DC

## 2017-02-27 MED ORDER — MONTELUKAST SODIUM 10 MG PO TABS: ORAL_TABLET | ORAL | 1 refills | Status: DC

## 2017-02-27 MED ORDER — FLUTICASONE-SALMETEROL 250-50 MCG/DOSE IN AEPB: INHALATION_SPRAY | RESPIRATORY_TRACT | 5 refills | Status: DC

## 2017-02-27 MED ORDER — LEVOTHYROXINE SODIUM 200 MCG PO TABS: 200.0000 ug | ORAL_TABLET | Freq: Every day | ORAL | 1 refills | Status: DC

## 2017-02-27 NOTE — Progress Notes (Signed)
Subjective:    Patient ID: Kyle Blair, male    DOB: [birth year], 56 y.o.   MRN: [id number]  HPI  Kyle Blair is a 56 yr old male who presents today for follow up.  Hypothyroid- Reports that he thins he ran out of his synthroid 255mcg and thinks he has been taking the 121mcg for 1 month which he had on hand.    Asthma exacerbation-He completed prednisone taper. Reports some improvement in his breathing but not quite back to normal. He continues to use his albuterol neb TID.  He is continuing advair.   Lab Results  Component Value Date   TSH 3.00 11/09/2016      Review of Systems See HPI  Past Medical History:  Diagnosis Date  . Arthritis   . Asthma   . BPH (benign prostatic hypertrophy)   . GERD with stricture   . History of stroke 2004  . Hyperlipidemia   . Hypertension   . Hypothyroid   . Lung nodule 02/03/2012  . Polycystic kidney disease   . Stroke [facility])      Social History   Social History  . Marital status: Married    Spouse name: N/A  . Number of children: 2  . Years of education: N/A   Occupational History  . DISABLED Unemployed   Social History Main Topics  . Smoking status: Current Every Day Smoker    Packs/day: 6.00    Years: 20.00    Types: Cigarettes  . Smokeless tobacco: Never Used  . Alcohol use No  . Drug use: No  . Sexual activity: Not on file   Other Topics Concern  . Not on file   Social History Narrative   Caffeine use:  1 pot coffee daily, 1 glass tea   Regular exercise:  No. Has active lifestyle.   2 biological and 1 stepson.   Former smoker- quit in 2007   He is on disability- reports that he has a history of heat stroke.               Past Surgical History:  Procedure Laterality Date  . CARPAL TUNNEL RELEASE    . COLONOSCOPY    . HERNIA REPAIR  2005   with mesh  . KNEE ARTHROSCOPY Bilateral 12/05/13   cyst and bone spur removed from left knee per pt.  Marland Kitchen KNEE ARTHROSCOPY Left 01/2015  . NASAL POLYP EXCISION   1992 or 93  . POLYPECTOMY    . TOTAL KNEE ARTHROPLASTY Right 05/19/2015   Procedure: RIGHT TOTAL KNEE ARTHROPLASTY;  Surgeon: Paralee Cancel, MD;  Location: [facility];  Service: Orthopedics;  Laterality: Right;    Family History  Problem Relation Age of Onset  . Heart disease Mother     pacemaker  . Diabetes Father   . Kidney disease Father   . Diabetes Sister   . Kidney disease Sister   . Colon cancer Neg Hx   . Esophageal cancer Neg Hx   . Rectal cancer Neg Hx   . Stomach cancer Neg Hx   . Prostate cancer Neg Hx   . Pancreatic cancer Neg Hx     Allergies  Allergen Reactions  . Aspirin Anaphylaxis  . Doxycycline Hyclate Swelling    Facial Swelling  . Ibuprofen Anaphylaxis  . Peanut Butter Flavor Anaphylaxis  . Cephalexin Rash    Current Outpatient Prescriptions on File Prior to Visit  Medication Sig Dispense Refill  . acetaminophen (TYLENOL) 325 MG tablet Take 1-2 tablets (  325-650 mg total) by mouth every 6 (six) hours as needed.    Marland Kitchen ADVAIR DISKUS 250-50 MCG/DOSE AEPB INHALE 1 PUFF INTO THE LUNGS TWICE DAILY 60 each 5  . albuterol (PROVENTIL) (2.5 MG/3ML) 0.083% nebulizer solution Take 3 mLs (2.5 mg total) by nebulization every 6 (six) hours as needed for wheezing or shortness of breath. 75 mL 12  . amLODipine (NORVASC) 5 MG tablet TAKE ONE (1) TABLET EACH DAY 90 tablet 1  . benazepril (LOTENSIN) 10 MG tablet TAKE 1 TABLET(10 MG) BY MOUTH DAILY 30 tablet 5  . diphenhydrAMINE (BENADRYL) 25 MG tablet Take 25 mg by mouth at bedtime as needed for sleep.     . furosemide (LASIX) 20 MG tablet Take 1 tablet (20 mg total) by mouth daily as needed. 3 tablet 0  . gabapentin (NEURONTIN) 300 MG capsule Take 300 mg by mouth 3 (three) times daily.   0  . levothyroxine (SYNTHROID) 200 MCG tablet Take 1 tablet (200 mcg total) by mouth daily before breakfast. 30 tablet 2  . lovastatin (MEVACOR) 40 MG tablet TAKE ONE (1) TABLET AT BEDTIME 90 tablet 1  . methocarbamol (ROBAXIN) 500 MG tablet  Take 1 tablet (500 mg total) by mouth every 6 (six) hours as needed for muscle spasms. 50 tablet 0  . montelukast (SINGULAIR) 10 MG tablet TAKE ONE TABLET DAILY IN THE MORNING 90 tablet 1  . omeprazole (PRILOSEC) 40 MG capsule TAKE ONE CAPSULE BY MOUTH DAILY 90 capsule 0  . Oxycodone HCl 10 MG TABS Take 10 mg by mouth 3 (three) times daily as needed (pain.).   0  . OXYCONTIN 30 MG 12 hr tablet Take 30 mg by mouth 2 (two) times daily.  0  . predniSONE (DELTASONE) 10 MG tablet 4 tabs by mouth once daily for 2 days, then 3 tabs daily x 2 days, then 2 tabs daily x 2 days, then 1 tab daily x 2 days 20 tablet 0  . tamsulosin (FLOMAX) 0.4 MG CAPS capsule TAKE ONE CAPSULE BY MOUTH EVERY NIGHT AT BEDTIME (Patient taking differently: TAKE ONE CAPSULE BY MOUTH EVERY DAY) 90 capsule 0  . VENTOLIN HFA 108 (90 Base) MCG/ACT inhaler INHALE 2 PUFFS INTO THE LUNGS EVERY 4 HOURS AS NEEDED FOR WHEEZING ORSHORTNESS OF BREATH 18 g 3   No current facility-administered medications on file prior to visit.     BP 129/77 (BP Location: Left Arm, Patient Position: Sitting, Cuff Size: Large)   Pulse 85   Temp 98.5 F (36.9 C) (Oral)   Resp 16   Ht 5\' 6"  (1.676 m)   Wt 229 lb 3.2 oz (104 kg)   SpO2 93%   BMI 36.99 kg/m       Objective:   Physical Exam  Constitutional: He is oriented to person, place, and time. He appears well-developed and well-nourished. No distress.  HENT:  Head: Normocephalic and atraumatic.  Cardiovascular: Normal rate and regular rhythm.   No murmur heard. Pulmonary/Chest: Effort normal and breath sounds normal. No respiratory distress. He has no wheezes. He has no rales.  Musculoskeletal: He exhibits no edema.  Neurological: He is alert and oriented to person, place, and time.  Skin: Skin is warm and dry.  Psychiatric: He has a normal mood and affect. His behavior is normal. Thought content normal.          Assessment & Plan:  Asthma with acute exacerbation- lung exam is  improved.  Advised pt to continue advair, nebs, singulair.  Call  if symptoms worsen or if they fail to improve.

## 2017-02-27 NOTE — Patient Instructions (Addendum)
Please complete lab work prior to leaving.  Call if your breathing does not continue to improve.

## 2017-02-27 NOTE — Progress Notes (Signed)
Pre visit review using our clinic review tool, if applicable. No additional management support is needed unless otherwise documented below in the visit note. 

## 2017-02-27 NOTE — Assessment & Plan Note (Signed)
Lab Results  Component Value Date   TSH 15.31 (H) 02/27/2017   TSH is low, pt needs to increase synthroid from 177mcg back up to 225mcg. See phone note. Repeat TSH in 6 weeks.

## 2017-04-12 ENCOUNTER — Other Ambulatory Visit (HOSPITAL_BASED_OUTPATIENT_CLINIC_OR_DEPARTMENT_OTHER): Payer: Self-pay | Admitting: Physician Assistant

## 2017-04-12 ENCOUNTER — Ambulatory Visit (HOSPITAL_BASED_OUTPATIENT_CLINIC_OR_DEPARTMENT_OTHER)
Admission: RE | Admit: 2017-04-12 | Discharge: 2017-04-12 | Disposition: A | Discharge status: Home / Self Care | Payer: Medicare Other | Source: Ambulatory Visit | Attending: Physician Assistant | Admitting: Physician Assistant

## 2017-04-12 DIAGNOSIS — M25461 Effusion, right knee: Secondary | ICD-10-CM | POA: Diagnosis not present

## 2017-04-12 DIAGNOSIS — M25561 Pain in right knee: Secondary | ICD-10-CM

## 2017-04-12 DIAGNOSIS — M25562 Pain in left knee: Secondary | ICD-10-CM

## 2017-04-12 DIAGNOSIS — Z96651 Presence of right artificial knee joint: Secondary | ICD-10-CM | POA: Diagnosis not present

## 2017-04-12 DIAGNOSIS — M1712 Unilateral primary osteoarthritis, left knee: Secondary | ICD-10-CM | POA: Insufficient documentation

## 2017-04-12 DIAGNOSIS — G894 Chronic pain syndrome: Secondary | ICD-10-CM

## 2017-05-10 ENCOUNTER — Ambulatory Visit: Payer: Medicare Other | Admitting: Family

## 2017-05-16 ENCOUNTER — Telehealth: Payer: Self-pay | Admitting: Family

## 2017-05-16 NOTE — Telephone Encounter (Signed)
Caller name:Barnet Rash Relationship to patient: Can be reached:[phone] Pharmacy:  Reason for call:Requesting to speak to nurse, would not say regarding what

## 2017-05-17 NOTE — Telephone Encounter (Signed)
Attempted to reach pt and received message that "all circuits are busy now".

## 2017-05-17 NOTE — Telephone Encounter (Signed)
Attempted to reach pt and received message that "all circuits are busy". Will try again later.

## 2017-05-27 ENCOUNTER — Other Ambulatory Visit: Payer: Self-pay | Admitting: Family

## 2017-05-30 NOTE — Telephone Encounter (Signed)
Spoke with pt last week and was told that he had bad tooth and was not able to afford to have anything done to it with his current dentist. He denies swelling of gum but states tooth is bad and hurts and needs to be extracted. Advised pt to call his current dentist and ask about payment plans or search for new dental office. Pt voiced understanding.

## 2017-05-30 NOTE — Telephone Encounter (Signed)
Please let him know that if tooth is infected, I could see him for antibiotics while he gets in with a new denstist.

## 2017-05-30 NOTE — Telephone Encounter (Signed)
He denied fever or gum swelling when I talked to him last week. Do we just need to bring him in for a visit?

## 2017-05-31 NOTE — Telephone Encounter (Signed)
Yes please

## 2017-05-31 NOTE — Telephone Encounter (Signed)
Scheduled pt appt for Friday at 1:45pm.

## 2017-06-02 ENCOUNTER — Ambulatory Visit: Payer: Medicare Other | Admitting: Family

## 2017-06-21 ENCOUNTER — Telehealth: Payer: Self-pay | Admitting: *Deleted

## 2017-06-21 NOTE — Telephone Encounter (Signed)
Pt brings in renewal of disability parking placard form. Form initiated and given to PCP to sign.

## 2017-06-22 ENCOUNTER — Telehealth: Payer: Self-pay | Admitting: *Deleted

## 2017-06-22 DIAGNOSIS — G894 Chronic pain syndrome: Secondary | ICD-10-CM

## 2017-06-22 MED ORDER — NYSTATIN 100000 UNIT/ML MT SUSP: 5.0000 mL | Freq: Four times a day (QID) | OROMUCOSAL | 0 refills | Status: DC

## 2017-06-22 NOTE — Telephone Encounter (Signed)
Spoke with pt. He states he has forgotten to rinse his mouth a few times after using inhaler and he now has a sore throat and thinks he is developing thrush again. He is requesting rx for magic mouthwash like he had before? Denies any visible coating inside mouth at this time. Please advise?

## 2017-06-22 NOTE — Telephone Encounter (Signed)
rx sent for nystatin.  He also requested a referral to Dr. Letta Pate when I saw his wife.  Referral placed. I have signed handicapped paperwork as well.

## 2017-06-22 NOTE — Telephone Encounter (Signed)
Notified pt's spouse and placed parking application at front desk for pick up. Copy sent for scanning.

## 2017-06-22 NOTE — Telephone Encounter (Signed)
Notified pt's spouse that parking application has been placed at front desk for pick up. Copy sent for scanning.

## 2017-07-19 ENCOUNTER — Other Ambulatory Visit: Payer: Self-pay | Admitting: Family

## 2017-08-02 ENCOUNTER — Other Ambulatory Visit: Payer: Self-pay | Admitting: Family

## 2017-08-07 ENCOUNTER — Ambulatory Visit (INDEPENDENT_AMBULATORY_CARE_PROVIDER_SITE_OTHER): Payer: Medicare Other | Admitting: Family

## 2017-08-07 ENCOUNTER — Encounter: Payer: Self-pay | Admitting: Family

## 2017-08-07 VITALS — BP 147/85 | HR 70 | Temp 97.9°F | Resp 18 | Ht 66.0 in | Wt 226.4 lb

## 2017-08-07 DIAGNOSIS — J45901 Unspecified asthma with (acute) exacerbation: Secondary | ICD-10-CM | POA: Diagnosis not present

## 2017-08-07 MED ORDER — PREDNISONE 10 MG PO TABS: ORAL_TABLET | ORAL | 0 refills | Status: DC

## 2017-08-07 NOTE — Progress Notes (Signed)
Subjective:    Patient ID: Kyle Blair, male    DOB: 05-11-1961, 56 y.o.   MRN: [id number]  HPI  Mr. Navis is a 56 yr old male who presents today to discuss asthma exacerbation. Reports symptoms worsened 2 days ago.  Reports that he uses advair as ordered. Denies associated fever or chest congestion.   Review of Systems See HPI  Past Medical History:  Diagnosis Date  . Arthritis   . Asthma   . BPH (benign prostatic hypertrophy)   . GERD with stricture   . History of stroke 2004  . Hyperlipidemia   . Hypertension   . Hypothyroid   . Lung nodule 02/03/2012  . Polycystic kidney disease   . Stroke [facility])      Social History   Social History  . Marital status: Married    Spouse name: N/A  . Number of children: 2  . Years of education: N/A   Occupational History  . DISABLED Unemployed   Social History Main Topics  . Smoking status: Current Every Day Smoker    Packs/day: 6.00    Years: 20.00    Types: Cigarettes  . Smokeless tobacco: Never Used  . Alcohol use No  . Drug use: No  . Sexual activity: Not on file   Other Topics Concern  . Not on file   Social History Narrative   Caffeine use:  1 pot coffee daily, 1 glass tea   Regular exercise:  No. Has active lifestyle.   2 biological and 1 stepson.   Former smoker- quit in 2007   He is on disability- reports that he has a history of heat stroke.               Past Surgical History:  Procedure Laterality Date  . CARPAL TUNNEL RELEASE    . COLONOSCOPY    . HERNIA REPAIR  2005   with mesh  . KNEE ARTHROSCOPY Bilateral 12/05/13   cyst and bone spur removed from left knee per pt.  Marland Kitchen KNEE ARTHROSCOPY Left 01/2015  . NASAL POLYP EXCISION  1992 or 93  . POLYPECTOMY    . TOTAL KNEE ARTHROPLASTY Right 05/19/2015   Procedure: RIGHT TOTAL KNEE ARTHROPLASTY;  Surgeon: Paralee Cancel, MD;  Location: [facility];  Service: Orthopedics;  Laterality: Right;    Family History  Problem Relation Age of Onset  . Heart  disease Mother        pacemaker  . Diabetes Father   . Kidney disease Father   . Diabetes Sister   . Kidney disease Sister   . Colon cancer Neg Hx   . Esophageal cancer Neg Hx   . Rectal cancer Neg Hx   . Stomach cancer Neg Hx   . Prostate cancer Neg Hx   . Pancreatic cancer Neg Hx     Allergies  Allergen Reactions  . Aspirin Anaphylaxis  . Doxycycline Hyclate Swelling    Facial Swelling  . Ibuprofen Anaphylaxis  . Peanut Butter Flavor Anaphylaxis  . Cephalexin Rash    Current Outpatient Prescriptions on File Prior to Visit  Medication Sig Dispense Refill  . acetaminophen (TYLENOL) 325 MG tablet Take 1-2 tablets (325-650 mg total) by mouth every 6 (six) hours as needed.    Marland Kitchen albuterol (PROVENTIL) (2.5 MG/3ML) 0.083% nebulizer solution Take 3 mLs (2.5 mg total) by nebulization every 6 (six) hours as needed for wheezing or shortness of breath. 75 mL 12  . amLODipine (NORVASC) 5 MG tablet TAKE ONE (  1) TABLET EACH DAY 90 tablet 0  . benazepril (LOTENSIN) 10 MG tablet TAKE 1 TABLET(10 MG) BY MOUTH DAILY 30 tablet 5  . diphenhydrAMINE (BENADRYL) 25 MG tablet Take 25 mg by mouth at bedtime as needed for sleep.     Marland Kitchen Fluticasone-Salmeterol (ADVAIR DISKUS) 250-50 MCG/DOSE AEPB INHALE 1 PUFF INTO THE LUNGS TWICE DAILY 60 each 5  . furosemide (LASIX) 20 MG tablet Take 1 tablet (20 mg total) by mouth daily as needed. 3 tablet 0  . gabapentin (NEURONTIN) 300 MG capsule Take 300 mg by mouth 3 (three) times daily.   0  . levothyroxine (SYNTHROID) 200 MCG tablet Take 1 tablet (200 mcg total) by mouth daily before breakfast. 90 tablet 1  . lovastatin (MEVACOR) 40 MG tablet TAKE ONE (1) TABLET AT BEDTIME 90 tablet 1  . methocarbamol (ROBAXIN) 500 MG tablet Take 1 tablet (500 mg total) by mouth every 6 (six) hours as needed for muscle spasms. 50 tablet 0  . montelukast (SINGULAIR) 10 MG tablet TAKE ONE TABLET DAILY IN THE MORNING 90 tablet 1  . nystatin (MYCOSTATIN) 100000 UNIT/ML suspension Take  5 mLs (500,000 Units total) by mouth 4 (four) times daily. 150 mL 0  . omeprazole (PRILOSEC) 40 MG capsule Take 1 capsule (40 mg total) by mouth daily. 30 capsule 0  . tamsulosin (FLOMAX) 0.4 MG CAPS capsule Take 1 capsule (0.4 mg total) by mouth daily. 90 capsule 1  . VENTOLIN HFA 108 (90 Base) MCG/ACT inhaler INHALE 2 PUFFS INTO THE LUNGS EVERY 4 HOURS AS NEEDED FOR WHEEZING ORSHORTNESS OF BREATH 18 g 0   No current facility-administered medications on file prior to visit.     BP (!) 147/85 (BP Location: Right Arm, Cuff Size: Large)   Pulse 70   Temp 97.9 F (36.6 C) (Oral)   Resp 18   Ht 5\' 6"  (1.676 m)   Wt 226 lb 6.4 oz (102.7 kg)   SpO2 97%   BMI 36.54 kg/m       Objective:   Physical Exam  Constitutional: He is oriented to person, place, and time. He appears well-developed and well-nourished. No distress.  HENT:  Head: Normocephalic and atraumatic.  Cardiovascular: Normal rate and regular rhythm.   No murmur heard. Pulmonary/Chest: Effort normal and breath sounds normal. No respiratory distress. He has no rales.  + left sided expiratory wheeze noted.    Musculoskeletal: He exhibits no edema.  Neurological: He is alert and oriented to person, place, and time.  Skin: Skin is warm and dry.  Psychiatric: He has a normal mood and affect. His behavior is normal. Thought content normal.          Assessment & Plan:  Asthma exacerbation- will rx with prednisone taper. Advised pt as follows:   Continue albuterol as needed and advair.  Call if symptoms worsen or if not improved in 1 week.

## 2017-08-07 NOTE — Patient Instructions (Addendum)
Please begin prednisone.  Continue albuterol as needed and advair.  Call if symptoms worsen or if not improved in 1 week.

## 2017-08-18 ENCOUNTER — Encounter (HOSPITAL_BASED_OUTPATIENT_CLINIC_OR_DEPARTMENT_OTHER): Payer: Self-pay | Admitting: *Deleted

## 2017-08-18 ENCOUNTER — Emergency Department (HOSPITAL_BASED_OUTPATIENT_CLINIC_OR_DEPARTMENT_OTHER)
Admission: EM | Admit: 2017-08-18 | Discharge: 2017-08-18 | Disposition: A | Discharge status: Home / Self Care | Payer: Medicare Other | Attending: Emergency Medicine | Admitting: Emergency Medicine

## 2017-08-18 ENCOUNTER — Emergency Department (HOSPITAL_BASED_OUTPATIENT_CLINIC_OR_DEPARTMENT_OTHER): Payer: Medicare Other

## 2017-08-18 DIAGNOSIS — S6992XA Unspecified injury of left wrist, hand and finger(s), initial encounter: Secondary | ICD-10-CM | POA: Diagnosis present

## 2017-08-18 DIAGNOSIS — E039 Hypothyroidism, unspecified: Secondary | ICD-10-CM | POA: Diagnosis not present

## 2017-08-18 DIAGNOSIS — S60222A Contusion of left hand, initial encounter: Secondary | ICD-10-CM | POA: Insufficient documentation

## 2017-08-18 DIAGNOSIS — F17210 Nicotine dependence, cigarettes, uncomplicated: Secondary | ICD-10-CM | POA: Insufficient documentation

## 2017-08-18 DIAGNOSIS — J45909 Unspecified asthma, uncomplicated: Secondary | ICD-10-CM | POA: Diagnosis not present

## 2017-08-18 DIAGNOSIS — W228XXA Striking against or struck by other objects, initial encounter: Secondary | ICD-10-CM | POA: Diagnosis not present

## 2017-08-18 DIAGNOSIS — Y929 Unspecified place or not applicable: Secondary | ICD-10-CM | POA: Diagnosis not present

## 2017-08-18 DIAGNOSIS — Z96651 Presence of right artificial knee joint: Secondary | ICD-10-CM | POA: Diagnosis not present

## 2017-08-18 DIAGNOSIS — Y998 Other external cause status: Secondary | ICD-10-CM | POA: Insufficient documentation

## 2017-08-18 DIAGNOSIS — Y9389 Activity, other specified: Secondary | ICD-10-CM | POA: Diagnosis not present

## 2017-08-18 DIAGNOSIS — J45901 Unspecified asthma with (acute) exacerbation: Secondary | ICD-10-CM | POA: Insufficient documentation

## 2017-08-18 DIAGNOSIS — I10 Essential (primary) hypertension: Secondary | ICD-10-CM | POA: Diagnosis not present

## 2017-08-18 DIAGNOSIS — Z8673 Personal history of transient ischemic attack (TIA), and cerebral infarction without residual deficits: Secondary | ICD-10-CM | POA: Diagnosis not present

## 2017-08-18 DIAGNOSIS — Z79899 Other long term (current) drug therapy: Secondary | ICD-10-CM | POA: Diagnosis not present

## 2017-08-18 MED ORDER — TETANUS-DIPHTH-ACELL PERTUSSIS 5-2.5-18.5 LF-MCG/0.5 IM SUSP
0.5000 mL | Freq: Once | INTRAMUSCULAR | Status: AC
  Administered 2017-08-18: 0.5 mL via INTRAMUSCULAR
  Filled 2017-08-18: qty 0.5

## 2017-08-18 MED ORDER — ACETAMINOPHEN 325 MG PO TABS: 325.0000 mg | ORAL_TABLET | Freq: Four times a day (QID) | ORAL | 0 refills | Status: AC | PRN

## 2017-08-18 NOTE — Discharge Instructions (Signed)
Take tylenol as needed for your pain, wear your splint at home for support, ice and keep your hand elevated while resting.

## 2017-08-18 NOTE — ED Provider Notes (Signed)
[facility] DEPT [facility] Provider Note   CSN: [id number] Arrival date & time: 08/18/17  1023     History   Chief Complaint Chief Complaint  Patient presents with  . Hand Injury    HPI Kyle Blair is a 56 y.o. male.  HPI   56 year old male presenting for evaluation of left hand injury. Patient states last night he accidently hit his hand on a mowing deck while trying to torque a bolt. Since then he has had sharp throbbing pain as well as swelling to the hand. Pain is rated a 6 out of 10, nonradiating, worse with palpation. He also suffers small skin abrasion to the affected area. He is not up-to-date with tetanus. He is left-hand dominant. He denies any wrist or elbow pain. No other injury. Besides from eyes no other specific treatment tried.  Past Medical History:  Diagnosis Date  . Arthritis   . Asthma   . BPH (benign prostatic hypertrophy)   . GERD with stricture   . History of stroke 2004  . Hyperlipidemia   . Hypertension   . Hypothyroid   . Lung nodule 02/03/2012  . Polycystic kidney disease   . Stroke [facility])     Patient Active Problem List   Diagnosis Date Noted  . Acute respiratory failure ([place]) 01/19/2017  . Community acquired pneumonia 01/18/2017  . Influenza with pneumonia 01/18/2017  . Indeterminate pulmonary nodules 07/23/2015  . Obese 05/20/2015  . Undiagnosed cardiac murmurs 04/15/2015  . Asthma with acute exacerbation 03/13/2014  . Hx of adenomatous colonic polyps 05/08/2013  . Barrett's esophagus 05/03/2013  . Chronic diarrhea 04/02/2013  . HTN (hypertension) 04/02/2013  . Hx of low back pain 09/26/2012  . Knee joint pain, right 05/02/2012  . Asthma 12/13/2011  . Benign prostatic hyperplasia 12/13/2011  . Hyperlipidemia 12/13/2011  . GERD (gastroesophageal reflux disease) 12/13/2011  . Hypothyroid   . Polycystic kidney disease     Past Surgical History:  Procedure Laterality Date  . CARPAL TUNNEL RELEASE    . COLONOSCOPY    . HERNIA  REPAIR  2005   with mesh  . KNEE ARTHROSCOPY Bilateral 12/05/13   cyst and bone spur removed from left knee per pt.  Marland Kitchen KNEE ARTHROSCOPY Left 01/2015  . NASAL POLYP EXCISION  1992 or 93  . POLYPECTOMY    . TOTAL KNEE ARTHROPLASTY Right 05/19/2015   Procedure: RIGHT TOTAL KNEE ARTHROPLASTY;  Surgeon: Paralee Cancel, MD;  Location: [facility] ORS;  Service: Orthopedics;  Laterality: Right;       Home Medications    Prior to Admission medications   Medication Sig Start Date End Date Taking? Authorizing Provider  acetaminophen (TYLENOL) 325 MG tablet Take 1-2 tablets (325-650 mg total) by mouth every 6 (six) hours as needed. 05/19/15   Danae Orleans, PA-C  albuterol (PROVENTIL) (2.5 MG/3ML) 0.083% nebulizer solution Take 3 mLs (2.5 mg total) by nebulization every 6 (six) hours as needed for wheezing or shortness of breath. 01/21/17   Sid Falcon, MD  amLODipine (NORVASC) 5 MG tablet TAKE ONE (1) TABLET EACH DAY 05/29/17   [name], NP  benazepril (LOTENSIN) 10 MG tablet TAKE 1 TABLET(10 MG) BY MOUTH DAILY 09/14/15   [name], NP  diphenhydrAMINE (BENADRYL) 25 MG tablet Take 25 mg by mouth at bedtime as needed for sleep.     [provider]  EVZIO 2 MG/0.4ML SOAJ Call 911. Inject 1 auto-injector intramuscularly in shoulder or thigh. Repeat every 3 minutes as needed if no or  minimal response. 06/19/17   [provider]  Fluticasone-Salmeterol (ADVAIR DISKUS) 250-50 MCG/DOSE AEPB INHALE 1 PUFF INTO THE LUNGS TWICE DAILY 02/27/17   [name], NP  furosemide (LASIX) 20 MG tablet Take 1 tablet (20 mg total) by mouth daily as needed. 01/21/17   Sid Falcon, MD  gabapentin (NEURONTIN) 300 MG capsule Take 300 mg by mouth 3 (three) times daily.  09/18/15   [provider]  levothyroxine (SYNTHROID) 200 MCG tablet Take 1 tablet (200 mcg total) by mouth daily before breakfast. 02/27/17   [name], NP  lovastatin (MEVACOR) 40 MG tablet TAKE ONE (1)  TABLET AT BEDTIME 01/27/17   [name], NP  methocarbamol (ROBAXIN) 500 MG tablet Take 1 tablet (500 mg total) by mouth every 6 (six) hours as needed for muscle spasms. 05/19/15   Danae Orleans, PA-C  montelukast (SINGULAIR) 10 MG tablet TAKE ONE TABLET DAILY IN THE MORNING 02/27/17   [name], NP  nystatin (MYCOSTATIN) 100000 UNIT/ML suspension Take 5 mLs (500,000 Units total) by mouth 4 (four) times daily. 06/22/17   [name], NP  omeprazole (PRILOSEC) 40 MG capsule Take 1 capsule (40 mg total) by mouth daily. 08/03/17   [name], NP  oxyCODONE-acetaminophen (PERCOCET) 7.5-325 MG tablet Take 1 tablet by mouth 2 (two) times daily. 08/04/17   [provider]  predniSONE (DELTASONE) 10 MG tablet 4 tabs by mouth once daily for 2 days, then 3 tabs daily x 2 days, then 2 tabs daily x 2 days, then 1 tab daily x 2 days 08/07/17   [name], NP  tamsulosin (FLOMAX) 0.4 MG CAPS capsule Take 1 capsule (0.4 mg total) by mouth daily. 02/27/17   [name], NP  VENTOLIN HFA 108 (90 Base) MCG/ACT inhaler INHALE 2 PUFFS INTO THE LUNGS EVERY 4 HOURS AS NEEDED FOR WHEEZING ORSHORTNESS OF BREATH 07/20/17   [name], NP  XTAMPZA ER 18 MG C12A Take 1 capsule by mouth 2 (two) times daily. 08/04/17   [provider]    Family History Family History  Problem Relation Age of Onset  . Heart disease Mother        pacemaker  . Diabetes Father   . Kidney disease Father   . Diabetes Sister   . Kidney disease Sister   . Colon cancer Neg Hx   . Esophageal cancer Neg Hx   . Rectal cancer Neg Hx   . Stomach cancer Neg Hx   . Prostate cancer Neg Hx   . Pancreatic cancer Neg Hx     Social History Social History  Substance Use Topics  . Smoking status: Current Every Day Smoker    Packs/day: 6.00    Years: 20.00    Types: Cigarettes  . Smokeless tobacco: Never Used  . Alcohol use No     Allergies   Aspirin; Doxycycline hyclate;  Ibuprofen; Peanut butter flavor; and Cephalexin   Review of Systems Review of Systems  Constitutional: Negative for fever.  Musculoskeletal: Positive for myalgias.  Skin: Positive for wound. Negative for rash.  Neurological: Negative for numbness.     Physical Exam Updated Vital Signs BP (!) 146/106 (BP Location: Right Arm)   Pulse 84   Temp 98.8 F (37.1 C) (Oral)   Resp 20   SpO2 98%   Physical Exam  Constitutional: He appears well-developed and well-nourished. No distress.  HENT:  Head: Atraumatic.  Eyes: Conjunctivae are normal.  Neck: Neck supple.  Musculoskeletal: He exhibits tenderness (Left hand: Tenderness  to dorsum of hand with mild swelling noted. Small abrasion overlying the tenderness without any deep laceration. Able to make a fist, brisk cap refills all fingers, no finger injury, no deep laceration).  Left wrist with normal flexion and extension  Neurological: He is alert.  Skin: No rash noted.  Psychiatric: He has a normal mood and affect.  Nursing note and vitals reviewed.    ED Treatments / Results  Labs (all labs ordered are listed, but only abnormal results are displayed) Labs Reviewed - No data to display  EKG  EKG Interpretation None       Radiology Dg Hand Complete Left  Result Date: 08/18/2017 CLINICAL DATA:  Left hand injury after blunt trauma. EXAM: LEFT HAND - COMPLETE 3+ VIEW COMPARISON:  None. FINDINGS: There is no evidence of fracture or dislocation. Scattered mild degenerative changes. No focal bone abnormality. Soft tissues are unremarkable. IMPRESSION: Negative. Electronically Signed   By: Titus Dubin M.D.   On: 08/18/2017 10:59    Procedures Procedures (including critical care time)  Medications Ordered in ED Medications - No data to display   Initial Impression / Assessment and Plan / ED Course  I have reviewed the triage vital signs and the nursing notes.  Pertinent labs & imaging results that were available  during my care of the patient were reviewed by me and considered in my medical decision making (see chart for details).     BP (!) 146/106 (BP Location: Right Arm)   Pulse 84   Temp 98.8 F (37.1 C) (Oral)   Resp 20   SpO2 98%    Final Clinical Impressions(s) / ED Diagnoses   Final diagnoses:  Contusion of left hand, initial encounter    New Prescriptions New Prescriptions   No medications on file   11:38 AM Patient had a mechanical injury to his left hand. X-ray negative for acute fracture. This is likely a contusion of the hand. Rice therapy discussed.   Domenic Moras, PA-C 08/18/17 1139    Quintella Reichert, MD 08/19/17 [phone]

## 2017-08-18 NOTE — ED Triage Notes (Signed)
Injury to his left hand. Last night he hit it on the mowing deck. Swelling and pain.

## 2017-08-29 ENCOUNTER — Other Ambulatory Visit: Payer: Self-pay | Admitting: Family

## 2017-09-14 ENCOUNTER — Encounter: Payer: Self-pay | Admitting: Internal Medicine

## 2017-09-18 ENCOUNTER — Ambulatory Visit (INDEPENDENT_AMBULATORY_CARE_PROVIDER_SITE_OTHER): Payer: Medicare Other | Admitting: Family

## 2017-09-18 ENCOUNTER — Encounter: Payer: Self-pay | Admitting: Family

## 2017-09-18 ENCOUNTER — Other Ambulatory Visit (HOSPITAL_BASED_OUTPATIENT_CLINIC_OR_DEPARTMENT_OTHER): Payer: Self-pay | Admitting: Nephrology

## 2017-09-18 ENCOUNTER — Encounter: Payer: Self-pay | Admitting: Internal Medicine

## 2017-09-18 ENCOUNTER — Telehealth: Payer: Self-pay | Admitting: Family

## 2017-09-18 ENCOUNTER — Other Ambulatory Visit: Payer: Self-pay | Admitting: Nephrology

## 2017-09-18 VITALS — BP 135/89 | HR 91 | Temp 97.8°F | Resp 16 | Ht 66.0 in | Wt 219.2 lb

## 2017-09-18 DIAGNOSIS — I10 Essential (primary) hypertension: Secondary | ICD-10-CM | POA: Diagnosis not present

## 2017-09-18 DIAGNOSIS — E785 Hyperlipidemia, unspecified: Secondary | ICD-10-CM | POA: Diagnosis not present

## 2017-09-18 DIAGNOSIS — F329 Major depressive disorder, single episode, unspecified: Secondary | ICD-10-CM | POA: Diagnosis not present

## 2017-09-18 DIAGNOSIS — F32A Depression, unspecified: Secondary | ICD-10-CM

## 2017-09-18 DIAGNOSIS — N189 Chronic kidney disease, unspecified: Secondary | ICD-10-CM

## 2017-09-18 DIAGNOSIS — N183 Chronic kidney disease, stage 3 unspecified: Secondary | ICD-10-CM

## 2017-09-18 DIAGNOSIS — J45909 Unspecified asthma, uncomplicated: Secondary | ICD-10-CM | POA: Diagnosis not present

## 2017-09-18 LAB — POC URINALSYSI DIPSTICK (AUTOMATED)
BILIRUBIN UA: NEGATIVE
Blood, UA: NEGATIVE
GLUCOSE UA: NEGATIVE
KETONES UA: NEGATIVE
LEUKOCYTES UA: NEGATIVE
Nitrite, UA: NEGATIVE
Protein, UA: NEGATIVE
SPEC GRAV UA: 1.01 (ref 1.010–1.025)
Urobilinogen, UA: 0.2 E.U./dL
pH, UA: 6 (ref 5.0–8.0)

## 2017-09-18 MED ORDER — FUROSEMIDE 20 MG PO TABS: 20.0000 mg | ORAL_TABLET | Freq: Every day | ORAL | 5 refills | Status: DC | PRN

## 2017-09-18 NOTE — Progress Notes (Signed)
Subjective:    Patient ID: Kyle Blair, male    DOB: 16-Jul-1961, 56 y.o.   MRN: [id number]  HPI   HTN- maintained on benazepril, amlodipine (recently increased from 5mg  to 10mg ).   BP Readings from Last 3 Encounters:  09/18/17 135/89  08/18/17 (!) 146/106  08/07/17 (!) 147/85   Asthma- maintained on singulair, advair, prn albuterol  gerd- maintained on omeprazole.   Hyperlipidemia- maintained on mevacor.  Lab Results  Component Value Date   CHOL 157 10/11/2016   HDL 32.30 (L) 10/11/2016   LDLCALC 68 01/11/2016   LDLDIRECT 97.0 10/11/2016   TRIG 241.0 (H) 10/11/2016   CHOLHDL 5 10/11/2016   PCKD- Diagnosed in 2009.  Reports that he had an episode of "tremendous back pain" saw nephrology (Dr. Jettie Pagan) Lab Results  Component Value Date   CREATININE 0.73 01/19/2017   Depression-Not eating, feels really down.  Son recently left for college. He reports feeling very lonely because his wife is suffering from depression and sleeps all day. He is also down about their current financial status. He was wife are both on disability. They recently lost monthly income when their son turned 48. This has given them great financial hardship. He denies current thoughts of hurting himself. He denies thoughts of hurting others. He does report that he sometimes feels that his life is not worth living. He denies having a plan.  Review of Systems See HPI  Past Medical History:  Diagnosis Date  . Arthritis   . Asthma   . BPH (benign prostatic hypertrophy)   . GERD with stricture   . History of stroke 2004  . Hyperlipidemia   . Hypertension   . Hypothyroid   . Lung nodule 02/03/2012  . Polycystic kidney disease   . Stroke [facility])      Social History   Social History  . Marital status: Married    Spouse name: N/A  . Number of children: 2  . Years of education: N/A   Occupational History  . DISABLED Unemployed   Social History Main Topics  . Smoking status: Current Every Day  Smoker    Packs/day: 6.00    Years: 20.00    Types: Cigarettes  . Smokeless tobacco: Never Used  . Alcohol use No  . Drug use: No  . Sexual activity: Not on file   Other Topics Concern  . Not on file   Social History Narrative   Caffeine use:  1 pot coffee daily, 1 glass tea   Regular exercise:  No. Has active lifestyle.   2 biological and 1 stepson.   Former smoker- quit in 2007   He is on disability- reports that he has a history of heat stroke.               Past Surgical History:  Procedure Laterality Date  . CARPAL TUNNEL RELEASE    . COLONOSCOPY    . HERNIA REPAIR  2005   with mesh  . KNEE ARTHROSCOPY Bilateral 12/05/13   cyst and bone spur removed from left knee per pt.  Marland Kitchen KNEE ARTHROSCOPY Left 01/2015  . NASAL POLYP EXCISION  1992 or 93  . POLYPECTOMY    . TOTAL KNEE ARTHROPLASTY Right 05/19/2015   Procedure: RIGHT TOTAL KNEE ARTHROPLASTY;  Surgeon: Paralee Cancel, MD;  Location: [facility] ORS;  Service: Orthopedics;  Laterality: Right;    Family History  Problem Relation Age of Onset  . Heart disease Mother  pacemaker  . Diabetes Father   . Kidney disease Father   . Diabetes Sister   . Kidney disease Sister   . Colon cancer Neg Hx   . Esophageal cancer Neg Hx   . Rectal cancer Neg Hx   . Stomach cancer Neg Hx   . Prostate cancer Neg Hx   . Pancreatic cancer Neg Hx     Allergies  Allergen Reactions  . Aspirin Anaphylaxis  . Doxycycline Hyclate Swelling    Facial Swelling  . Ibuprofen Anaphylaxis  . Peanut Butter Flavor Anaphylaxis  . Cephalexin Rash    Current Outpatient Prescriptions on File Prior to Visit  Medication Sig Dispense Refill  . acetaminophen (TYLENOL) 325 MG tablet Take 1-2 tablets (325-650 mg total) by mouth every 6 (six) hours as needed for mild pain or moderate pain. 30 tablet 0  . albuterol (PROVENTIL) (2.5 MG/3ML) 0.083% nebulizer solution Take 3 mLs (2.5 mg total) by nebulization every 6 (six) hours as needed for wheezing or  shortness of breath. 75 mL 12  . amLODipine (NORVASC) 5 MG tablet TAKE ONE (1) TABLET BY MOUTH EVERY DAY 90 tablet 1  . benazepril (LOTENSIN) 10 MG tablet TAKE 1 TABLET(10 MG) BY MOUTH DAILY 30 tablet 5  . EVZIO 2 MG/0.4ML SOAJ Call 911. Inject 1 auto-injector intramuscularly in shoulder or thigh. Repeat every 3 minutes as needed if no or minimal response.  0  . Fluticasone-Salmeterol (ADVAIR DISKUS) 250-50 MCG/DOSE AEPB INHALE 1 PUFF INTO THE LUNGS TWICE DAILY 60 each 5  . furosemide (LASIX) 20 MG tablet Take 1 tablet (20 mg total) by mouth daily as needed. 3 tablet 0  . gabapentin (NEURONTIN) 300 MG capsule Take 300 mg by mouth 3 (three) times daily.   0  . levothyroxine (SYNTHROID, LEVOTHROID) 200 MCG tablet TAKE ONE (1) TABLET EACH DAY BEFORE BREAKFAST 90 tablet 1  . lovastatin (MEVACOR) 40 MG tablet TAKE ONE TABLET BY MOUTH EVERY NIGHT AT BEDTIME 90 tablet 2  . methocarbamol (ROBAXIN) 500 MG tablet Take 1 tablet (500 mg total) by mouth every 6 (six) hours as needed for muscle spasms. 50 tablet 0  . montelukast (SINGULAIR) 10 MG tablet TAKE ONE TABLET DAILY IN THE MORNING 90 tablet 1  . omeprazole (PRILOSEC) 40 MG capsule Take 1 capsule (40 mg total) by mouth daily. 30 capsule 0  . oxyCODONE-acetaminophen (PERCOCET) 7.5-325 MG tablet Take 1 tablet by mouth 2 (two) times daily.  0  . VENTOLIN HFA 108 (90 Base) MCG/ACT inhaler INHALE 2 PUFFS INTO THE LUNGS EVERY 4 HOURS AS NEEDED FOR WHEEZING ORSHORTNESS OF BREATH 18 g 0  . XTAMPZA ER 18 MG C12A Take 1 capsule by mouth 2 (two) times daily.  0   No current facility-administered medications on file prior to visit.     BP 135/89 (BP Location: Left Arm, Cuff Size: Large)   Pulse 91   Temp 97.8 F (36.6 C) (Oral)   Resp 16   Ht 5\' 6"  (1.676 m)   Wt 219 lb 3.2 oz (99.4 kg)   SpO2 97%   BMI 35.38 kg/m       Objective:   Physical Exam  Constitutional: He is oriented to person, place, and time. He appears well-developed and well-nourished.  No distress.  HENT:  Head: Normocephalic and atraumatic.  Cardiovascular: Normal rate and regular rhythm.   No murmur heard. Pulmonary/Chest: Effort normal and breath sounds normal. No respiratory distress. He has no wheezes. He has no rales.  Musculoskeletal:  He exhibits no edema.  Neurological: He is alert and oriented to person, place, and time.  Skin: Skin is warm and dry.  Psychiatric: Thought content normal. Cognition and memory are normal.  Flat affect. Easily agitated today.          Assessment & Plan:  Depression-I advised the patient that I really think he would benefit from medication for his depression. He declines medication at this time. He is specifically concerned that if he is on medication for depression this may impair his future ability to obtain a gun permit. He currently does have guns at home. He denies thoughts of hurting himself or others currently. I asked the patient if he would be agreeable to call 911 if he did have thoughts of hurting himself. Initially he hesitated however after further discussion he does agree to contract for safety. States that he would call have his wife called 911. We also discussed possibility of having him do some counseling. A total of 45  minutes were spent face-to-face with the patient during this encounter and over half of that time was spent on counseling and coordination of care. The patient was counseled on depression and treatment of depression.  Tobacco abuse-she is interested in starting Chantix. I would like to see his kidney function from his nephrologist prior to restarting Chantix. I have requested these results.  Hypertension-blood pressure stable on current medications, continue same.  Polycystic kidney disease-this is currently being monitored and managed by his nephrologist.  Asthma-currently stable on current meds. Continue same.  Hyperlipidemia-LDL at goal on Mevacor. Continue same.

## 2017-09-18 NOTE — Telephone Encounter (Signed)
Please contact [facility] and request copy of most labs and office visit?

## 2017-09-18 NOTE — Patient Instructions (Signed)
We will get a copy of your labs from nephrology then we will send an rx for chantix for you to begin. Please consider starting counseling to help with your stress. You can call [phone].

## 2017-09-19 ENCOUNTER — Ambulatory Visit (HOSPITAL_BASED_OUTPATIENT_CLINIC_OR_DEPARTMENT_OTHER)
Admission: RE | Admit: 2017-09-19 | Discharge: 2017-09-19 | Disposition: A | Discharge status: Home / Self Care | Payer: Medicare Other | Source: Ambulatory Visit | Attending: Nephrology | Admitting: Nephrology

## 2017-09-19 DIAGNOSIS — N183 Chronic kidney disease, stage 3 unspecified: Secondary | ICD-10-CM

## 2017-09-19 DIAGNOSIS — N181 Chronic kidney disease, stage 1: Secondary | ICD-10-CM | POA: Diagnosis not present

## 2017-09-19 DIAGNOSIS — Q612 Polycystic kidney, adult type: Secondary | ICD-10-CM | POA: Insufficient documentation

## 2017-09-19 NOTE — Telephone Encounter (Signed)
Office note/labs received and placed in PCP's yellow folder for review.

## 2017-09-19 NOTE — Telephone Encounter (Signed)
Spoke with [facility] and requested records. Awaiting fax.

## 2017-09-22 ENCOUNTER — Telehealth: Payer: Self-pay | Admitting: Family

## 2017-09-22 NOTE — Telephone Encounter (Signed)
BP looked good when I saw him. I don't think we should increase at this time.

## 2017-09-22 NOTE — Telephone Encounter (Signed)
Pt called in to request a call back. He said that he and provider discussed him starting some medications but they were never called in to the pharmacy.   [phone]

## 2017-09-22 NOTE — Telephone Encounter (Signed)
MO-I also failed to mention that he expressed that his Nephrologist is wanting you to increase his Amlodipine to 10mg ? I'm not sure if you received anything about this? Plz advise/thx dmf

## 2017-09-22 NOTE — Telephone Encounter (Signed)
MO-I spoke with pt/he will contact Nephrologist to get labs sent to you to review so he can start the Chantix  He also states that he had U/S done and would like to know the results of that/plz advise/thx dmf

## 2017-09-22 NOTE — Telephone Encounter (Signed)
C the labs from his nephrologist I will send in his prescription. Please advise patient that it looks like his ultrasound was ordered by his  nephrologist. He should follow-up with their office for results.

## 2017-09-25 ENCOUNTER — Telehealth: Payer: Self-pay | Admitting: Family

## 2017-09-25 MED ORDER — VARENICLINE TARTRATE 0.5 MG X 11 & 1 MG X 42 PO MISC: ORAL | 0 refills | Status: DC

## 2017-09-25 NOTE — Telephone Encounter (Signed)
Patient presented to the office today for his wife's appointment. Advised patient I did review his renal function from his kidney doctor. Renal function is stable. Advised him at his request that I would send a prescription for Chantix. He is eager to quit smoking. He reports that his mood is improved. His before that his son coming home from college this weekend. He is advised to call 911 if he has thoughts of hurting self or others while on this medication. Patient verbalizes understanding.

## 2017-09-25 NOTE — Telephone Encounter (Signed)
Notified pt and he voices understanding. 

## 2017-09-27 ENCOUNTER — Other Ambulatory Visit: Payer: Medicare Other

## 2017-10-12 ENCOUNTER — Ambulatory Visit (AMBULATORY_SURGERY_CENTER): Payer: Self-pay

## 2017-10-12 VITALS — Ht 66.0 in | Wt 215.0 lb

## 2017-10-12 DIAGNOSIS — Z860100 Personal history of colon polyps, unspecified: Secondary | ICD-10-CM

## 2017-10-12 MED ORDER — SUPREP BOWEL PREP KIT 17.5-3.13-1.6 GM/177ML PO SOLN: 1.0000 | Freq: Once | ORAL | 0 refills | Status: AC

## 2017-10-12 NOTE — Progress Notes (Signed)
No allergies to eggs or soy No diet meds No home oxygen No past problems with anesthesia  Declined emmi 

## 2017-10-13 ENCOUNTER — Encounter: Payer: Self-pay | Admitting: Internal Medicine

## 2017-10-16 ENCOUNTER — Other Ambulatory Visit: Payer: Self-pay | Admitting: Family

## 2017-10-16 NOTE — Telephone Encounter (Signed)
Rx approved and sent to the pharmacy by e-script.//AB/CMA 

## 2017-10-23 ENCOUNTER — Telehealth: Payer: Self-pay | Admitting: Internal Medicine

## 2017-10-23 NOTE — Telephone Encounter (Signed)
Returned pt's call; pt agreed to come to 4th floor receptionist's desk and pick up his free Sample of So-Hi. Kyle Blair/ PV

## 2017-10-23 NOTE — Telephone Encounter (Signed)
Patient states prior Josem Kaufmann is required for prep for colon on 10/26/17 and needs to change it or get prep some other way. Pt had pre visit on 10/12/17.

## 2017-10-26 ENCOUNTER — Ambulatory Visit (AMBULATORY_SURGERY_CENTER): Payer: Medicare Other | Admitting: Internal Medicine

## 2017-10-26 ENCOUNTER — Encounter: Payer: Self-pay | Admitting: Internal Medicine

## 2017-10-26 VITALS — BP 113/70 | HR 75 | Temp 98.0°F | Resp 16 | Ht 66.0 in | Wt 219.0 lb

## 2017-10-26 DIAGNOSIS — D123 Benign neoplasm of transverse colon: Secondary | ICD-10-CM | POA: Diagnosis not present

## 2017-10-26 DIAGNOSIS — R1310 Dysphagia, unspecified: Secondary | ICD-10-CM

## 2017-10-26 DIAGNOSIS — K219 Gastro-esophageal reflux disease without esophagitis: Secondary | ICD-10-CM

## 2017-10-26 DIAGNOSIS — D127 Benign neoplasm of rectosigmoid junction: Secondary | ICD-10-CM

## 2017-10-26 DIAGNOSIS — Z86010 Personal history of colonic polyps: Secondary | ICD-10-CM

## 2017-10-26 DIAGNOSIS — Z8719 Personal history of other diseases of the digestive system: Secondary | ICD-10-CM

## 2017-10-26 MED ORDER — PANTOPRAZOLE SODIUM 40 MG PO TBEC: 40.0000 mg | DELAYED_RELEASE_TABLET | Freq: Every day | ORAL | 3 refills | Status: DC

## 2017-10-26 MED ORDER — SODIUM CHLORIDE 0.9 % IV SOLN: 500.0000 mL | INTRAVENOUS | Status: DC

## 2017-10-26 NOTE — Progress Notes (Signed)
Once pt passed flatus he no longer c/o nausea or abd pain.  Abdomen was soft.  maw

## 2017-10-26 NOTE — Progress Notes (Signed)
To recovery, report to RN, VSS. 

## 2017-10-26 NOTE — Progress Notes (Signed)
Called to room to assist during endoscopic procedure.  Patient ID and intended procedure confirmed with present staff. Received instructions for my participation in the procedure from the performing physician.  

## 2017-10-26 NOTE — Op Note (Signed)
[facility] Patient Name: Kyle Blair Procedure Date: 10/26/2017 1:59 PM MRN: [id number] Endoscopist: Jerene Bears , MD Age: 56 Referring MD:  Date of Birth: 1961-03-23 Gender: Male Account #: [id number] Procedure:                Upper GI endoscopy Indications:              Dysphagia (improved after dilation in 2015),                            Gastro-esophageal reflux disease, remote history of                            Barrett's esophagus Medicines:                Monitored Anesthesia Care Procedure:                Pre-Anesthesia Assessment:                           - Prior to the procedure, a History and Physical                            was performed, and patient medications and                            allergies were reviewed. The patient's tolerance of                            previous anesthesia was also reviewed. The risks                            and benefits of the procedure and the sedation                            options and risks were discussed with the patient.                            All questions were answered, and informed consent                            was obtained. Prior Anticoagulants: The patient has                            taken no previous anticoagulant or antiplatelet                            agents. ASA Grade Assessment: III - A patient with                            severe systemic disease. After reviewing the risks                            and benefits, the patient was deemed in  satisfactory condition to undergo the procedure.                           After obtaining informed consent, the endoscope was                            passed under direct vision. Throughout the                            procedure, the patient's blood pressure, pulse, and                            oxygen saturations were monitored continuously. The                            Endoscope was introduced through the  mouth, and                            advanced to the second part of duodenum. The upper                            GI endoscopy was accomplished without difficulty.                            The patient tolerated the procedure well. Scope In: Scope Out: Findings:                 The esophagus and gastroesophageal junction were                            examined with white light and narrow band imaging                            (NBI) from a forward view and retroflexed position.                            There was no visual evidence of Barrett's esophagus.                           A small hiatal hernia was present.                           No endoscopic abnormality was evident in the                            esophagus to explain the patient's complaint of                            dysphagia. It was decided, however, to proceed with                            dilation of the lower third of the esophagus and at  the gastroesophageal junction. A TTS dilator was                            passed through the scope. Dilation with a 16-17-18                            mm balloon dilator was performed to 18 mm. The                            dilation site was examined and showed no obvious                            change.                           The entire examined stomach was normal.                           The examined duodenum was normal. Complications:            No immediate complications. Estimated Blood Loss:     Estimated blood loss: none. Impression:               - There is no endoscopic evidence of Barrett's                            esophagus.                           - Small hiatal hernia.                           - No endoscopic esophageal abnormality seen today                            to explain patient's dysphagia. Esophagus dilated                            to 18 mm with balloon.                           - Normal stomach.                            - Normal examined duodenum.                           - No specimens collected. Recommendation:           - Patient has a contact number available for                            emergencies. The signs and symptoms of potential                            delayed complications were discussed with the  patient. Return to normal activities tomorrow.                            Written discharge instructions were provided to the                            patient.                           - Resume previous diet.                           - Continue present medications.                           - Change omeprazole to pantoprazole 40 mg once                            daily (before breakfast) given patient report of                            breakthrough heartburn recently with chronic                            omeprazole.                           - Repeat upper endoscopy as needed for retreatment. Jerene Bears, MD 10/26/2017 2:46:01 PM This report has been signed electronically.

## 2017-10-26 NOTE — Patient Instructions (Signed)
YOU HAD AN ENDOSCOPIC PROCEDURE TODAY AT [facility]:   Refer to the procedure report that was given to you for any specific questions about what was found during the examination.  If the procedure report does not answer your questions, please call your gastroenterologist to clarify.  If you requested that your care partner not be given the details of your procedure findings, then the procedure report has been included in a sealed envelope for you to review at your convenience later.  YOU SHOULD EXPECT: Some feelings of bloating in the abdomen. Passage of more gas than usual.  Walking can help get rid of the air that was put into your GI tract during the procedure and reduce the bloating. If you had a lower endoscopy (such as a colonoscopy or flexible sigmoidoscopy) you may notice spotting of blood in your stool or on the toilet paper. If you underwent a bowel prep for your procedure, you may not have a normal bowel movement for a few days.  Please Note:  You might notice some irritation and congestion in your nose or some drainage.  This is from the oxygen used during your procedure.  There is no need for concern and it should clear up in a day or so.  SYMPTOMS TO REPORT IMMEDIATELY:   Following lower endoscopy (colonoscopy or flexible sigmoidoscopy):  Excessive amounts of blood in the stool  Significant tenderness or worsening of abdominal pains  Swelling of the abdomen that is new, acute  Fever of 100F or higher   Following upper endoscopy (EGD)  Vomiting of blood or coffee ground material  New chest pain or pain under the shoulder blades  Painful or persistently difficult swallowing  New shortness of breath  Fever of 100F or higher  Black, tarry-looking stools  For urgent or emergent issues, a gastroenterologist can be reached at any hour by calling [phone].   DIET: Please follow the dilatation diet the rest of today.  Handout was given to your wife.  Drink  plenty of fluids but you should avoid alcoholic beverages for 24 hours.  ACTIVITY:  You should plan to take it easy for the rest of today and you should NOT DRIVE or use heavy machinery until tomorrow (because of the sedation medicines used during the test).    FOLLOW UP: Our staff will call the number listed on your records the next business day following your procedure to check on you and address any questions or concerns that you may have regarding the information given to you following your procedure. If we do not reach you, we will leave a message.  However, if you are feeling well and you are not experiencing any problems, there is no need to return our call.  We will assume that you have returned to your regular daily activities without incident.  If any biopsies were taken you will be contacted by phone or by letter within the next 1-3 weeks.  Please call us at [phone] if you have not heard about the biopsies in 3 weeks.    SIGNATURES/CONFIDENTIALITY: You and/or your care partner have signed paperwork which will be entered into your electronic medical record.  These signatures attest to the fact that that the information above on your After Visit Summary has been reviewed and is understood.  Full responsibility of the confidentiality of this discharge information lies with you and/or your care-partner.   Handouts were given to your care partner on polyps, diverticulosis, GERD, hiatal  hernia and the dilatation diet to follow the rest of today. Pantoprazole 40 mg was sent to [facility]. You may resume your other current medications today. Await biopsy results. Please call if any questions or concerns.

## 2017-10-26 NOTE — Progress Notes (Signed)
Pt's states no medical or surgical changes since previsit or office visit. 

## 2017-10-26 NOTE — Op Note (Signed)
[facility] Patient Name: Kyle Blair Procedure Date: 10/26/2017 1:58 PM MRN: [id number] Endoscopist: Jerene Bears , MD Age: 55 Referring MD:  Date of Birth: 04/28/1961 Gender: Male Account #: [id number] Procedure:                Colonoscopy Indications:              Surveillance: Personal history of adenomatous                            polyps on last colonoscopy 3 years ago Medicines:                Monitored Anesthesia Care Procedure:                Pre-Anesthesia Assessment:                           - Prior to the procedure, a History and Physical                            was performed, and patient medications and                            allergies were reviewed. The patient's tolerance of                            previous anesthesia was also reviewed. The risks                            and benefits of the procedure and the sedation                            options and risks were discussed with the patient.                            All questions were answered, and informed consent                            was obtained. Prior Anticoagulants: The patient has                            taken no previous anticoagulant or antiplatelet                            agents. ASA Grade Assessment: III - A patient with                            severe systemic disease. After reviewing the risks                            and benefits, the patient was deemed in                            satisfactory condition to undergo the procedure.  After obtaining informed consent, the colonoscope                            was passed under direct vision. Throughout the                            procedure, the patient's blood pressure, pulse, and                            oxygen saturations were monitored continuously. The                            Model PCF-H190DL [id number]) scope was introduced                            through the anus and  advanced to the the cecum,                            identified by appendiceal orifice and ileocecal                            valve. The colonoscopy was performed without                            difficulty. The patient tolerated the procedure                            well. The quality of the bowel preparation was                            good. The ileocecal valve, appendiceal orifice, and                            rectum were photographed. Scope In: 2:19:37 PM Scope Out: 2:38:38 PM Scope Withdrawal Time: 0 hours 14 minutes 17 seconds  Total Procedure Duration: 0 hours 19 minutes 1 second  Findings:                 The digital rectal exam was normal.                           A 4 mm polyp was found in the proximal transverse                            colon. The polyp was sessile. The polyp was removed                            with a cold snare. Resection and retrieval were                            complete.                           A tattoo was seen in the transverse colon. There  was no evidence of residual polyp tissue.                           A 5 mm polyp was found in the recto-sigmoid colon.                            The polyp was sessile. The polyp was removed with a                            cold snare. Resection and retrieval were complete.                           Multiple small and large-mouthed diverticula were                            found in the sigmoid colon, descending colon,                            hepatic flexure and ascending colon.                           The retroflexed view of the distal rectum and anal                            verge was normal and showed no anal or rectal                            abnormalities. Complications:            No immediate complications. Estimated Blood Loss:     Estimated blood loss was minimal. Impression:               - One 4 mm polyp in the proximal transverse colon,                             removed with a cold snare. Resected and retrieved.                           - A tattoo was seen in the transverse colon. There                            was no evidence of residual polyp tissue.                           - One 5 mm polyp at the recto-sigmoid colon,                            removed with a cold snare. Resected and retrieved.                           - Moderate diverticulosis in the sigmoid colon, in  the descending colon, at the hepatic flexure and in                            the ascending colon.                           - The distal rectum and anal verge are normal on                            retroflexion view. Recommendation:           - Patient has a contact number available for                            emergencies. The signs and symptoms of potential                            delayed complications were discussed with the                            patient. Return to normal activities tomorrow.                            Written discharge instructions were provided to the                            patient.                           - Resume previous diet.                           - Continue present medications.                           - Await pathology results.                           - Repeat colonoscopy is recommended for                            surveillance. The colonoscopy date will be                            determined after pathology results from today's                            exam become available for review. Jerene Bears, MD 10/26/2017 2:54:50 PM This report has been signed electronically.

## 2017-10-27 ENCOUNTER — Telehealth: Payer: Self-pay | Admitting: *Deleted

## 2017-10-27 NOTE — Telephone Encounter (Signed)
  Follow up Call-  Call back number 10/26/2017  Post procedure Call Back phone  # [phone]  Permission to leave phone message Yes  Some recent data might be hidden     Patient questions:  Do you have a fever, pain , or abdominal swelling? No. Pain Score  0 *  Have you tolerated food without any problems? Yes.    Have you been able to return to your normal activities? Yes.    Do you have any questions about your discharge instructions: Diet   No. Medications  No. Follow up visit  No.  Do you have questions or concerns about your Care? No.  Actions: * If pain score is 4 or above: No action needed, pain <4.

## 2017-10-31 ENCOUNTER — Encounter: Payer: Self-pay | Admitting: Internal Medicine

## 2017-11-28 ENCOUNTER — Other Ambulatory Visit: Payer: Self-pay

## 2017-11-28 ENCOUNTER — Emergency Department (HOSPITAL_BASED_OUTPATIENT_CLINIC_OR_DEPARTMENT_OTHER)
Admission: EM | Admit: 2017-11-28 | Discharge: 2017-11-28 | Disposition: A | Discharge status: Home / Self Care | Payer: Medicare Other | Attending: Emergency Medicine | Admitting: Emergency Medicine

## 2017-11-28 ENCOUNTER — Encounter (HOSPITAL_BASED_OUTPATIENT_CLINIC_OR_DEPARTMENT_OTHER): Payer: Self-pay | Admitting: *Deleted

## 2017-11-28 ENCOUNTER — Telehealth: Payer: Self-pay | Admitting: Family

## 2017-11-28 DIAGNOSIS — Z91010 Allergy to peanuts: Secondary | ICD-10-CM | POA: Insufficient documentation

## 2017-11-28 DIAGNOSIS — Z79899 Other long term (current) drug therapy: Secondary | ICD-10-CM | POA: Diagnosis not present

## 2017-11-28 DIAGNOSIS — I10 Essential (primary) hypertension: Secondary | ICD-10-CM | POA: Insufficient documentation

## 2017-11-28 DIAGNOSIS — E039 Hypothyroidism, unspecified: Secondary | ICD-10-CM | POA: Insufficient documentation

## 2017-11-28 DIAGNOSIS — F17210 Nicotine dependence, cigarettes, uncomplicated: Secondary | ICD-10-CM | POA: Diagnosis not present

## 2017-11-28 DIAGNOSIS — J4541 Moderate persistent asthma with (acute) exacerbation: Secondary | ICD-10-CM

## 2017-11-28 DIAGNOSIS — R0602 Shortness of breath: Secondary | ICD-10-CM | POA: Diagnosis present

## 2017-11-28 MED ORDER — PREDNISONE 20 MG PO TABS: ORAL_TABLET | ORAL | 0 refills | Status: DC

## 2017-11-28 MED ORDER — METHYLPREDNISOLONE SODIUM SUCC 125 MG IJ SOLR
125.0000 mg | Freq: Once | INTRAMUSCULAR | Status: AC
  Administered 2017-11-28: 125 mg via INTRAMUSCULAR
  Filled 2017-11-28: qty 2

## 2017-11-28 MED FILL — predniSONE 20 MG TABS: 20 | 4 days supply | Qty: 8 | Fill #0

## 2017-11-28 NOTE — Discharge Instructions (Signed)
Use your inhaler every 4 hours(6 puffs) while awake, return for sudden worsening shortness of breath, or if you need to use your inhaler more often.  ° °

## 2017-11-28 NOTE — ED Notes (Addendum)
Pt reports shortness of breath, Hx asthma , demanding prednisone , came to ED because hid Dr is "booked " for today and tomorrow, per pt. Denies chest pain. No obvious distress. Expiratory wheezing.

## 2017-11-28 NOTE — ED Provider Notes (Signed)
[facility] Provider Note   CSN: [id number] Arrival date & time: 11/28/17  1141     History   Chief Complaint Chief Complaint  Patient presents with  . Shortness of Breath    HPI Kyle Blair is a 56 y.o. male.  56 yo M with a chief complaint of an asthma exacerbation.  The patient has been having shortness of breath for the past week and feels like his asthma.  He has been using his nebulizer at home with minimal improvement.  Typically when he gets this point he needs an injection of Solu-Medrol and a burst dose of steroids.  He tried to get in with his family physician but was unable to.  He denies fevers or chills.  Has had a mild cough with this.  Denies hemoptysis.   The history is provided by the patient.  Shortness of Breath  This is a new problem. The problem occurs frequently.The current episode started less than 1 hour ago. The problem has not changed since onset.Pertinent negatives include no fever, no headaches, no chest pain, no vomiting, no abdominal pain and no rash. He has tried nothing for the symptoms. The treatment provided no relief. He has had no prior hospitalizations. He has had prior ED visits. He has had no prior ICU admissions. Associated medical issues do not include asthma.    Past Medical History:  Diagnosis Date  . Arthritis    severe; pt takes strong pain meds daily/legs  . Asthma   . BPH (benign prostatic hypertrophy)   . GERD with stricture   . History of stroke 2004  . Hyperlipidemia   . Hypertension   . Hypothyroid   . Lung nodule 02/03/2012  . Polycystic kidney disease   . Stroke [facility])     Patient Active Problem List   Diagnosis Date Noted  . Acute respiratory failure ([facility]) 01/19/2017  . Community acquired pneumonia 01/18/2017  . Influenza with pneumonia 01/18/2017  . Indeterminate pulmonary nodules 07/23/2015  . Obese 05/20/2015  . Undiagnosed cardiac murmurs 04/15/2015  . Asthma with acute  exacerbation 03/13/2014  . Hx of adenomatous colonic polyps 05/08/2013  . Barrett's esophagus 05/03/2013  . Chronic diarrhea 04/02/2013  . HTN (hypertension) 04/02/2013  . Hx of low back pain 09/26/2012  . Knee joint pain, right 05/02/2012  . Asthma 12/13/2011  . Benign prostatic hyperplasia 12/13/2011  . Hyperlipidemia 12/13/2011  . GERD (gastroesophageal reflux disease) 12/13/2011  . Hypothyroid   . Polycystic kidney disease     Past Surgical History:  Procedure Laterality Date  . CARPAL TUNNEL RELEASE    . COLONOSCOPY    . HERNIA REPAIR  2005   with mesh  . KNEE ARTHROSCOPY Bilateral 12/05/13   cyst and bone spur removed from left knee per pt.  [facility] KNEE ARTHROSCOPY Left 01/2015  . NASAL POLYP EXCISION  1992 or 93  . POLYPECTOMY    . TOTAL KNEE ARTHROPLASTY Right 05/19/2015   Procedure: RIGHT TOTAL KNEE ARTHROPLASTY;  Surgeon: Paralee Cancel, MD;  Location: [facility] ORS;  Service: Orthopedics;  Laterality: Right;       Home Medications    Prior to Admission medications   Medication Sig Start Date End Date Taking? Authorizing Provider  acetaminophen (TYLENOL) 325 MG tablet Take 1-2 tablets (325-650 mg total) by mouth every 6 (six) hours as needed for mild pain or moderate pain. 08/18/17   Domenic Moras, PA-C  albuterol (PROVENTIL) (2.5 MG/3ML) 0.083% nebulizer solution Take 3 mLs (2.5  mg total) by nebulization every 6 (six) hours as needed for wheezing or shortness of breath. 01/21/17   Sid Falcon, MD  amLODipine (NORVASC) 5 MG tablet TAKE ONE (1) TABLET BY MOUTH EVERY DAY 08/29/17   [name], NP  benazepril (LOTENSIN) 10 MG tablet TAKE 1 TABLET(10 MG) BY MOUTH DAILY 09/14/15   [name], NP  EVZIO 2 MG/0.4ML [facility] Call 911. Inject 1 auto-injector intramuscularly in shoulder or thigh. Repeat every 3 minutes as needed if no or minimal response. 06/19/17   [provider]  Fluticasone-Salmeterol (ADVAIR DISKUS) 250-50 MCG/DOSE AEPB INHALE 1 PUFF INTO THE LUNGS  TWICE DAILY 02/27/17   [name], NP  furosemide (LASIX) 20 MG tablet Take 1 tablet (20 mg total) by mouth daily as needed. 09/18/17   [name], NP  gabapentin (NEURONTIN) 300 MG capsule Take 300 mg by mouth 3 (three) times daily.  09/18/15   [provider]  levothyroxine (SYNTHROID, LEVOTHROID) 200 MCG tablet TAKE ONE (1) TABLET EACH DAY BEFORE BREAKFAST 08/29/17   [name], NP  lovastatin (MEVACOR) 40 MG tablet TAKE ONE TABLET BY MOUTH EVERY NIGHT AT BEDTIME 08/29/17   [name], NP  methocarbamol (ROBAXIN) 500 MG tablet Take 1 tablet (500 mg total) by mouth every 6 (six) hours as needed for muscle spasms. 05/19/15   Danae Orleans, PA-C  montelukast (SINGULAIR) 10 MG tablet TAKE ONE TABLET DAILY IN THE MORNING 02/27/17   [name], NP  omeprazole (PRILOSEC) 40 MG capsule Take 1 capsule (40 mg total) by mouth daily. 08/03/17   [name], NP  oxyCODONE-acetaminophen (PERCOCET) 7.5-325 MG tablet Take 1 tablet by mouth 2 (two) times daily. 08/04/17   [provider]  pantoprazole (PROTONIX) 40 MG tablet Take 1 tablet (40 mg total) by mouth daily. Take daily before breakfast on an empty stomach. 10/26/17   Pyrtle, Lajuan Lines, MD  predniSONE (DELTASONE) 20 MG tablet 2 tabs po daily x 4 days 11/28/17   Deno Etienne, DO  varenicline (CHANTIX STARTING MONTH PAK) 0.5 MG X 11 & 1 MG X 42 tablet Take 0.5mg  tab PO daily for 3 days, then increase to one 0.5 mg tablet twice daily for 4 days, then increase to one 1 mg tablet twice daily. 09/25/17   [name], NP  VENTOLIN HFA 108 (90 Base) MCG/ACT inhaler INHALE 2 PUFFS INTO THE LUNGS EVERY 4 HOURS AS NEEDED FOR WHEEZING ORSHORTNESS OF BREATH 10/16/17   [name], NP  XTAMPZA ER 18 MG C12A Take 1 capsule by mouth 2 (two) times daily. 08/04/17   [provider]    Family History Family History  Problem Relation Age of Onset  . Heart disease Mother        pacemaker  .  Diabetes Father   . Kidney disease Father   . Diabetes Sister   . Kidney disease Sister   . Colon cancer Neg Hx   . Esophageal cancer Neg Hx   . Rectal cancer Neg Hx   . Stomach cancer Neg Hx   . Prostate cancer Neg Hx   . Pancreatic cancer Neg Hx     Social History Social History   Tobacco Use  . Smoking status: Current Every Day Smoker    Packs/day: 6.00    Years: 20.00    Pack years: 120.00    Types: Cigarettes  . Smokeless tobacco: Never Used  Substance Use Topics  . Alcohol use: No    Alcohol/week: 0.0 oz  . Drug use:  No     Allergies   Aspirin; Doxycycline hyclate; Ibuprofen; Peanut butter flavor; and Cephalexin   Review of Systems Review of Systems  Constitutional: Negative for chills and fever.  HENT: Negative for congestion and facial swelling.   Eyes: Negative for discharge and visual disturbance.  Respiratory: Positive for shortness of breath.   Cardiovascular: Negative for chest pain and palpitations.  Gastrointestinal: Negative for abdominal pain, diarrhea and vomiting.  Musculoskeletal: Negative for arthralgias and myalgias.  Skin: Negative for color change and rash.  Neurological: Negative for tremors, syncope and headaches.  Psychiatric/Behavioral: Negative for confusion and dysphoric mood.     Physical Exam Updated Vital Signs BP 136/77   Pulse 87   Temp 98.2 F (36.8 C) (Oral)   Resp 20   Ht 5\' 6"  (1.676 m)   Wt 97.5 kg (215 lb)   SpO2 95%   BMI 34.70 kg/m   Physical Exam  Constitutional: He is oriented to person, place, and time. He appears well-developed and well-nourished.  HENT:  Head: Normocephalic and atraumatic.  Eyes: EOM are normal. Pupils are equal, round, and reactive to light.  Neck: Normal range of motion. Neck supple. No JVD present.  Cardiovascular: Normal rate and regular rhythm. Exam reveals no gallop and no friction rub.  No murmur heard. Pulmonary/Chest: No respiratory distress. He has wheezes (scattered  throughout).  Abdominal: He exhibits no distension. There is no rebound and no guarding.  Musculoskeletal: Normal range of motion.  Neurological: He is alert and oriented to person, place, and time.  Skin: No rash noted. No pallor.  Psychiatric: He has a normal mood and affect. His behavior is normal.  Nursing note and vitals reviewed.    ED Treatments / Results  Labs (all labs ordered are listed, but only abnormal results are displayed) Labs Reviewed - No data to display  EKG  EKG Interpretation None       Radiology No results found.  Procedures Procedures (including critical care time)  Medications Ordered in ED Medications  methylPREDNISolone sodium succinate (SOLU-MEDROL) 125 mg/2 mL injection 125 mg (not administered)     Initial Impression / Assessment and Plan / ED Course  I have reviewed the triage vital signs and the nursing notes.  Pertinent labs & imaging results that were available during my care of the patient were reviewed by me and considered in my medical decision making (see chart for details).     56 yo M with a chief complaint of shortness of breath.  Feels like his prior asthma attacks.  The patient is requesting his normal therapy that he gets as an outpatient.  He does not feel like he needs breathing treatment here in the emergency department.  I will give him a dose of Solu-Medrol as the patient request.  Start him on a burst of steroids.  Follow-up with his PCP.  12:13 PM:  I have discussed the diagnosis/risks/treatment options with the patient and believe the pt to be eligible for discharge home to follow-up with PCP. We also discussed returning to the ED immediately if new or worsening sx occur. We discussed the sx which are most concerning (e.g., sudden worsening pain, fever, inability to tolerate by mouth) that necessitate immediate return. Medications administered to the patient during their visit and any new prescriptions provided to the  patient are listed below.  Medications given during this visit Medications  methylPREDNISolone sodium succinate (SOLU-MEDROL) 125 mg/2 mL injection 125 mg (not administered)     The  patient appears reasonably screen and/or stabilized for discharge and I doubt any other medical condition or other [facility] requiring further screening, evaluation, or treatment in the ED at this time prior to discharge.    Final Clinical Impressions(s) / ED Diagnoses   Final diagnoses:  Moderate persistent asthma with exacerbation    ED Discharge Orders        Ordered    predniSONE (DELTASONE) 20 MG tablet     11/28/17 1207       Deno Etienne, DO 11/28/17 1213

## 2017-11-28 NOTE — Telephone Encounter (Signed)
Pt was advised by front office per my instruction that he would need ER follow up with PCP for re-assessment of symptoms and to determine if he will need longer course of medication. Pt still unhappy and reportedly rude to front office staff. Also left detailed message on cell# re: instruction and this will be further addressed at follow up on the 7th.

## 2017-11-28 NOTE — ED Triage Notes (Signed)
States he would like to get a shot of Prednisone for wheezing. Hx of asthma.

## 2017-11-28 NOTE — Telephone Encounter (Signed)
Wife states she missed the phone call and did not even listen to her message.  Advised wife they left a message, she would like to speak with Trish directly.

## 2017-11-28 NOTE — Telephone Encounter (Signed)
Patient walked in and stts he was seen at the ER on 11/28/2017 and is requesting a refill of Prezone.. Patient is requesting to have RX sent to pharm. Please call patient [phone]. Adv patient a message will be sent. Patient stts he was only given 4 days worth at the ER.

## 2017-11-29 NOTE — Telephone Encounter (Signed)
The message I left was restating what pt was told in the office about needing ER f/u to determine if longer duration of treatment is needed. Callback to pt is not necessary at this time.

## 2017-12-01 ENCOUNTER — Encounter: Payer: Self-pay | Admitting: Family

## 2017-12-01 ENCOUNTER — Ambulatory Visit (INDEPENDENT_AMBULATORY_CARE_PROVIDER_SITE_OTHER): Payer: Medicare Other | Admitting: Family

## 2017-12-01 ENCOUNTER — Other Ambulatory Visit: Payer: Self-pay | Admitting: Family

## 2017-12-01 VITALS — BP 136/89 | HR 88 | Temp 97.7°F | Resp 18 | Ht 66.0 in | Wt 222.0 lb

## 2017-12-01 DIAGNOSIS — J209 Acute bronchitis, unspecified: Secondary | ICD-10-CM

## 2017-12-01 MED ORDER — AZITHROMYCIN 250 MG PO TABS: ORAL_TABLET | ORAL | 0 refills | Status: DC

## 2017-12-01 MED ORDER — PREDNISONE 10 MG PO TABS: ORAL_TABLET | ORAL | 0 refills | Status: DC

## 2017-12-01 NOTE — Patient Instructions (Signed)
Please continue prednisone as ordered. Continue albuterol every 6 hours as needed. Start zpak for bronchitis.

## 2017-12-01 NOTE — Progress Notes (Signed)
Subjective:    Patient ID: Kyle Blair, male    DOB: Nov 28, 1961, 56 y.o.   MRN: [id number]  HPI  Kyle Blair is a 56 yr old male who presents today for follow up of his asthma.In the emergency department for asthma exacerbation on November 28, 2017.  ER record is reviewed.  Presented that day with shortness of breath.  He was discharged home with oral prednisone following a Solu-Medrol injection in the emergency department.  Chest x-ray was not performed today. Reports that he had been struggling for several days prior to his ER visit. He was prescribed 40mg  once daily for 4 days.  Reports that his symptoms are improved but not back to baseline.     Review of Systems See HPI  Past Medical History:  Diagnosis Date  . Arthritis    severe; pt takes strong pain meds daily/legs  . Asthma   . BPH (benign prostatic hypertrophy)   . GERD with stricture   . History of stroke 2004  . Hyperlipidemia   . Hypertension   . Hypothyroid   . Lung nodule 02/03/2012  . Polycystic kidney disease   . Stroke [facility])      Social History   Socioeconomic History  . Marital status: Married    Spouse name: Not on file  . Number of children: 2  . Years of education: Not on file  . Highest education level: Not on file  Social Needs  . Financial resource strain: Not on file  . Food insecurity - worry: Not on file  . Food insecurity - inability: Not on file  . Transportation needs - medical: Not on file  . Transportation needs - non-medical: Not on file  Occupational History  . Occupation: DISABLED    Employer: UNEMPLOYED  Tobacco Use  . Smoking status: Current Every Day Smoker    Packs/day: 6.00    Years: 20.00    Pack years: 120.00    Types: Cigarettes  . Smokeless tobacco: Never Used  Substance and Sexual Activity  . Alcohol use: No    Alcohol/week: 0.0 oz  . Drug use: No  . Sexual activity: Not on file  Other Topics Concern  . Not on file  Social History Narrative   Caffeine use:   1 pot coffee daily, 1 glass tea   Regular exercise:  No. Has active lifestyle.   2 biological and 1 stepson.   Former smoker- quit in 2007   He is on disability- reports that he has a history of heat stroke.               Past Surgical History:  Procedure Laterality Date  . CARPAL TUNNEL RELEASE    . COLONOSCOPY    . HERNIA REPAIR  2005   with mesh  . KNEE ARTHROSCOPY Bilateral 12/05/13   cyst and bone spur removed from left knee per pt.  Marland Kitchen KNEE ARTHROSCOPY Left 01/2015  . NASAL POLYP EXCISION  1992 or 93  . POLYPECTOMY    . TOTAL KNEE ARTHROPLASTY Right 05/19/2015   Procedure: RIGHT TOTAL KNEE ARTHROPLASTY;  Surgeon: Paralee Cancel, MD;  Location: [facility];  Service: Orthopedics;  Laterality: Right;    Family History  Problem Relation Age of Onset  . Heart disease Mother        pacemaker  . Diabetes Father   . Kidney disease Father   . Diabetes Sister   . Kidney disease Sister   . Colon cancer Neg Hx   .  Esophageal cancer Neg Hx   . Rectal cancer Neg Hx   . Stomach cancer Neg Hx   . Prostate cancer Neg Hx   . Pancreatic cancer Neg Hx     Allergies  Allergen Reactions  . Aspirin Anaphylaxis  . Doxycycline Hyclate Swelling    Facial Swelling  . Ibuprofen Anaphylaxis  . Peanut Butter Flavor Anaphylaxis  . Cephalexin Rash    Current Outpatient Medications on File Prior to Visit  Medication Sig Dispense Refill  . acetaminophen (TYLENOL) 325 MG tablet Take 1-2 tablets (325-650 mg total) by mouth every 6 (six) hours as needed for mild pain or moderate pain. 30 tablet 0  . albuterol (PROVENTIL) (2.5 MG/3ML) 0.083% nebulizer solution Take 3 mLs (2.5 mg total) by nebulization every 6 (six) hours as needed for wheezing or shortness of breath. 75 mL 12  . amLODipine (NORVASC) 5 MG tablet TAKE ONE (1) TABLET BY MOUTH EVERY DAY 90 tablet 1  . benazepril (LOTENSIN) 10 MG tablet TAKE 1 TABLET(10 MG) BY MOUTH DAILY 30 tablet 5  . EVZIO 2 MG/0.4ML [facility] Call 911. Inject 1  auto-injector intramuscularly in shoulder or thigh. Repeat every 3 minutes as needed if no or minimal response.  0  . Fluticasone-Salmeterol (ADVAIR DISKUS) 250-50 MCG/DOSE AEPB INHALE 1 PUFF INTO THE LUNGS TWICE DAILY 60 each 5  . furosemide (LASIX) 20 MG tablet Take 1 tablet (20 mg total) by mouth daily as needed. 30 tablet 5  . gabapentin (NEURONTIN) 300 MG capsule Take 300 mg by mouth 3 (three) times daily.   0  . levothyroxine (SYNTHROID, LEVOTHROID) 200 MCG tablet TAKE ONE (1) TABLET EACH DAY BEFORE BREAKFAST 90 tablet 1  . lovastatin (MEVACOR) 40 MG tablet TAKE ONE TABLET BY MOUTH EVERY NIGHT AT BEDTIME 90 tablet 2  . methocarbamol (ROBAXIN) 500 MG tablet Take 1 tablet (500 mg total) by mouth every 6 (six) hours as needed for muscle spasms. 50 tablet 0  . montelukast (SINGULAIR) 10 MG tablet TAKE ONE TABLET DAILY IN THE MORNING 90 tablet 1  . omeprazole (PRILOSEC) 40 MG capsule Take 1 capsule (40 mg total) by mouth daily. 30 capsule 0  . oxyCODONE-acetaminophen (PERCOCET) 7.5-325 MG tablet Take 1 tablet by mouth 2 (two) times daily.  0  . pantoprazole (PROTONIX) 40 MG tablet Take 1 tablet (40 mg total) by mouth daily. Take daily before breakfast on an empty stomach. 90 tablet 3  . predniSONE (DELTASONE) 20 MG tablet 2 tabs po daily x 4 days 8 tablet 0  . tamsulosin (FLOMAX) 0.4 MG CAPS capsule Take 0.4 mg by mouth at bedtime.  0  . varenicline (CHANTIX STARTING MONTH PAK) 0.5 MG X 11 & 1 MG X 42 tablet Take 0.5mg  tab PO daily for 3 days, then increase to one 0.5 mg tablet twice daily for 4 days, then increase to one 1 mg tablet twice daily. 53 tablet 0  . VENTOLIN HFA 108 (90 Base) MCG/ACT inhaler INHALE 2 PUFFS INTO THE LUNGS EVERY 4 HOURS AS NEEDED FOR WHEEZING ORSHORTNESS OF BREATH 18 g 1  . XTAMPZA ER 18 MG C12A Take 1 capsule by mouth 2 (two) times daily.  0   Current Facility-Administered Medications on File Prior to Visit  Medication Dose Route Frequency Provider Last Rate Last Dose    . 0.9 %  sodium chloride infusion  500 mL Intravenous Continuous Pyrtle, Lajuan Lines, MD        BP 136/89 (BP Location: Left Arm, Patient Position: Sitting,  Cuff Size: Large)   Pulse 88   Temp 97.7 F (36.5 C)   Resp 18   Ht 5\' 6"  (1.676 m)   Wt 222 lb (100.7 kg)   SpO2 96%   BMI 35.83 kg/m        Objective:   Physical Exam  Constitutional: He is oriented to person, place, and time. He appears well-developed and well-nourished. No distress.  HENT:  Head: Normocephalic and atraumatic.  Right Ear: Tympanic membrane and ear canal normal.  Left Ear: Tympanic membrane and ear canal normal.  Mouth/Throat: No oropharyngeal exudate, posterior oropharyngeal edema or posterior oropharyngeal erythema.  Cardiovascular: Normal rate and regular rhythm.  No murmur heard. Pulmonary/Chest: Effort normal. No respiratory distress. He has wheezes. He has no rales.  Musculoskeletal: He exhibits no edema.  Neurological: He is alert and oriented to person, place, and time.  Skin: Skin is warm and dry.  Psychiatric: He has a normal mood and affect. His behavior is normal. Thought content normal.          Assessment & Plan:  Bronchitis- with bronchospasm. Will extend prednisone taper and add zpak. Continue albuterol q6 hours. Pt advised to follow up if symptoms worsen or fail to improve.

## 2017-12-11 ENCOUNTER — Ambulatory Visit: Payer: Medicare Other | Admitting: Family

## 2017-12-22 ENCOUNTER — Other Ambulatory Visit: Payer: Self-pay | Admitting: Family

## 2017-12-28 DIAGNOSIS — M545 Low back pain: Secondary | ICD-10-CM | POA: Diagnosis not present

## 2017-12-28 DIAGNOSIS — Z96651 Presence of right artificial knee joint: Secondary | ICD-10-CM | POA: Diagnosis not present

## 2017-12-28 DIAGNOSIS — M25561 Pain in right knee: Secondary | ICD-10-CM | POA: Diagnosis not present

## 2017-12-28 DIAGNOSIS — Z79899 Other long term (current) drug therapy: Secondary | ICD-10-CM | POA: Diagnosis not present

## 2017-12-28 DIAGNOSIS — G894 Chronic pain syndrome: Secondary | ICD-10-CM | POA: Diagnosis not present

## 2017-12-28 DIAGNOSIS — Z79891 Long term (current) use of opiate analgesic: Secondary | ICD-10-CM | POA: Diagnosis not present

## 2017-12-28 DIAGNOSIS — M179 Osteoarthritis of knee, unspecified: Secondary | ICD-10-CM | POA: Diagnosis not present

## 2017-12-28 DIAGNOSIS — M25569 Pain in unspecified knee: Secondary | ICD-10-CM | POA: Diagnosis not present

## 2017-12-28 DIAGNOSIS — M25562 Pain in left knee: Secondary | ICD-10-CM | POA: Diagnosis not present

## 2017-12-28 DIAGNOSIS — Z09 Encounter for follow-up examination after completed treatment for conditions other than malignant neoplasm: Secondary | ICD-10-CM | POA: Diagnosis not present

## 2018-01-02 ENCOUNTER — Emergency Department (HOSPITAL_COMMUNITY): Payer: Medicare HMO

## 2018-01-02 ENCOUNTER — Observation Stay (HOSPITAL_COMMUNITY)
Admission: EM | Admit: 2018-01-02 | Discharge: 2018-01-03 | Disposition: A | Discharge status: Home / Self Care | Payer: Medicare HMO | Attending: Internal Medicine | Admitting: Internal Medicine

## 2018-01-02 ENCOUNTER — Encounter (HOSPITAL_COMMUNITY): Payer: Self-pay | Admitting: Emergency Medicine

## 2018-01-02 DIAGNOSIS — N39498 Other specified urinary incontinence: Secondary | ICD-10-CM | POA: Diagnosis not present

## 2018-01-02 DIAGNOSIS — M25561 Pain in right knee: Secondary | ICD-10-CM | POA: Insufficient documentation

## 2018-01-02 DIAGNOSIS — J45909 Unspecified asthma, uncomplicated: Secondary | ICD-10-CM | POA: Insufficient documentation

## 2018-01-02 DIAGNOSIS — K219 Gastro-esophageal reflux disease without esophagitis: Secondary | ICD-10-CM | POA: Insufficient documentation

## 2018-01-02 DIAGNOSIS — R410 Disorientation, unspecified: Secondary | ICD-10-CM | POA: Diagnosis not present

## 2018-01-02 DIAGNOSIS — F17210 Nicotine dependence, cigarettes, uncomplicated: Secondary | ICD-10-CM | POA: Diagnosis not present

## 2018-01-02 DIAGNOSIS — Z8673 Personal history of transient ischemic attack (TIA), and cerebral infarction without residual deficits: Secondary | ICD-10-CM | POA: Diagnosis not present

## 2018-01-02 DIAGNOSIS — E039 Hypothyroidism, unspecified: Secondary | ICD-10-CM | POA: Diagnosis present

## 2018-01-02 DIAGNOSIS — H532 Diplopia: Secondary | ICD-10-CM | POA: Diagnosis not present

## 2018-01-02 DIAGNOSIS — Z886 Allergy status to analgesic agent status: Secondary | ICD-10-CM | POA: Insufficient documentation

## 2018-01-02 DIAGNOSIS — I10 Essential (primary) hypertension: Secondary | ICD-10-CM | POA: Diagnosis not present

## 2018-01-02 DIAGNOSIS — Z79899 Other long term (current) drug therapy: Secondary | ICD-10-CM | POA: Insufficient documentation

## 2018-01-02 DIAGNOSIS — Q613 Polycystic kidney, unspecified: Secondary | ICD-10-CM | POA: Diagnosis not present

## 2018-01-02 DIAGNOSIS — I639 Cerebral infarction, unspecified: Secondary | ICD-10-CM | POA: Diagnosis present

## 2018-01-02 DIAGNOSIS — R4182 Altered mental status, unspecified: Secondary | ICD-10-CM

## 2018-01-02 DIAGNOSIS — E785 Hyperlipidemia, unspecified: Secondary | ICD-10-CM | POA: Insufficient documentation

## 2018-01-02 DIAGNOSIS — N401 Enlarged prostate with lower urinary tract symptoms: Secondary | ICD-10-CM | POA: Diagnosis not present

## 2018-01-02 DIAGNOSIS — Z96651 Presence of right artificial knee joint: Secondary | ICD-10-CM | POA: Insufficient documentation

## 2018-01-02 DIAGNOSIS — G9341 Metabolic encephalopathy: Secondary | ICD-10-CM | POA: Diagnosis present

## 2018-01-02 DIAGNOSIS — Z720 Tobacco use: Secondary | ICD-10-CM | POA: Diagnosis present

## 2018-01-02 DIAGNOSIS — N400 Enlarged prostate without lower urinary tract symptoms: Secondary | ICD-10-CM | POA: Diagnosis present

## 2018-01-02 LAB — CBC WITH DIFFERENTIAL/PLATELET
BASOS ABS: 0 10*3/uL (ref 0.0–0.1)
Basophils Relative: 0 %
EOS ABS: 0 10*3/uL (ref 0.0–0.7)
Eosinophils Relative: 0 %
HCT: 46.5 % (ref 39.0–52.0)
Hemoglobin: 16 g/dL (ref 13.0–17.0)
LYMPHS PCT: 17 %
Lymphs Abs: 1.4 10*3/uL (ref 0.7–4.0)
MCH: 30.8 pg (ref 26.0–34.0)
MCHC: 34.4 g/dL (ref 30.0–36.0)
MCV: 89.6 fL (ref 78.0–100.0)
Monocytes Absolute: 0.7 10*3/uL (ref 0.1–1.0)
Monocytes Relative: 9 %
Neutro Abs: 5.8 10*3/uL (ref 1.7–7.7)
Neutrophils Relative %: 74 %
Platelets: 168 10*3/uL (ref 150–400)
RBC: 5.19 MIL/uL (ref 4.22–5.81)
RDW: 13.8 % (ref 11.5–15.5)
WBC: 7.9 10*3/uL (ref 4.0–10.5)

## 2018-01-02 LAB — COMPREHENSIVE METABOLIC PANEL
ALBUMIN: 4.2 g/dL (ref 3.5–5.0)
ALT: 17 U/L (ref 17–63)
AST: 22 U/L (ref 15–41)
Alkaline Phosphatase: 87 U/L (ref 38–126)
Anion gap: 10 (ref 5–15)
BUN: 13 mg/dL (ref 6–20)
CO2: 30 mmol/L (ref 22–32)
CREATININE: 0.86 mg/dL (ref 0.61–1.24)
Calcium: 8.7 mg/dL — ABNORMAL LOW (ref 8.9–10.3)
Chloride: 93 mmol/L — ABNORMAL LOW (ref 101–111)
GFR calc Af Amer: >60 mL/min (ref >60)
GFR calc non Af Amer: >60 mL/min (ref >60)
GLUCOSE: 152 mg/dL — AB (ref 65–99)
Potassium: 3.6 mmol/L (ref 3.5–5.1)
SODIUM: 133 mmol/L — AB (ref 135–145)
Total Bilirubin: 0.6 mg/dL (ref 0.3–1.2)
Total Protein: 7.1 g/dL (ref 6.5–8.1)

## 2018-01-02 LAB — I-STAT TROPONIN, ED: Troponin i, poc: 0 ng/mL (ref 0.00–0.08)

## 2018-01-02 LAB — AMMONIA: Ammonia: 52 umol/L — ABNORMAL HIGH (ref 9–35)

## 2018-01-02 MED ORDER — METHYLPREDNISOLONE SODIUM SUCC 125 MG IJ SOLR
125.0000 mg | Freq: Once | INTRAMUSCULAR | Status: AC
  Administered 2018-01-02: 125 mg via INTRAVENOUS
  Filled 2018-01-02: qty 2

## 2018-01-02 MED ORDER — IPRATROPIUM-ALBUTEROL 0.5-2.5 (3) MG/3ML IN SOLN
3.0000 mL | Freq: Once | RESPIRATORY_TRACT | Status: AC
  Administered 2018-01-02: 3 mL via RESPIRATORY_TRACT
  Filled 2018-01-02: qty 3

## 2018-01-02 NOTE — ED Triage Notes (Signed)
Per EMS, pt took a nap at 1500 today, woke up at 1930 with increased confusion. Hx CVA, baseline slurred speech, abnormal gait. Pt has repetitive questioning with family. C/o "off and on" double vision that started last night. Pt takes pain medication for knee pain.

## 2018-01-02 NOTE — ED Provider Notes (Signed)
[facility] EMERGENCY DEPARTMENT Provider Note   CSN: [id number] Arrival date & time: 01/02/18  2236     History   Chief Complaint Chief Complaint  Patient presents with  . Altered Mental Status    HPI Kyle Blair is a 57 y.o. male.  Patient with PMH of stroke, HL, HTN, asthma, hypothyroid, and polycystic kidney disease presents to the ED with a chief complaint of confusion.  He was awakened from his nap today by his wife and reports that he has been confused.  He has a history of stroke and slow speech.  He states that since last night he has had diplopia.  He denies any flashers, floaters, or eye pain.  He reports wheezing and dry cough, but now fever. He reports having had bladder incontinence today during his nap.  He denies any seizures.  Denies numbness, weakness, or tingling.    The history is provided by the patient. No language interpreter was used.    Past Medical History:  Diagnosis Date  . Arthritis    severe; pt takes strong pain meds daily/legs  . Asthma   . BPH (benign prostatic hypertrophy)   . GERD with stricture   . History of stroke 2004  . Hyperlipidemia   . Hypertension   . Hypothyroid   . Lung nodule 02/03/2012  . Polycystic kidney disease   . Stroke [facility])     Patient Active Problem List   Diagnosis Date Noted  . Acute respiratory failure ([place]) 01/19/2017  . Community acquired pneumonia 01/18/2017  . Influenza with pneumonia 01/18/2017  . Indeterminate pulmonary nodules 07/23/2015  . Obese 05/20/2015  . Undiagnosed cardiac murmurs 04/15/2015  . Asthma with acute exacerbation 03/13/2014  . Hx of adenomatous colonic polyps 05/08/2013  . Barrett's esophagus 05/03/2013  . Chronic diarrhea 04/02/2013  . HTN (hypertension) 04/02/2013  . Hx of low back pain 09/26/2012  . Knee joint pain, right 05/02/2012  . Asthma 12/13/2011  . Benign prostatic hyperplasia 12/13/2011  . Hyperlipidemia 12/13/2011  . GERD (gastroesophageal  reflux disease) 12/13/2011  . Hypothyroid   . Polycystic kidney disease     Past Surgical History:  Procedure Laterality Date  . CARPAL TUNNEL RELEASE    . COLONOSCOPY    . HERNIA REPAIR  2005   with mesh  . KNEE ARTHROSCOPY Bilateral 12/05/13   cyst and bone spur removed from left knee per pt.  Marland Kitchen KNEE ARTHROSCOPY Left 01/2015  . NASAL POLYP EXCISION  1992 or 93  . POLYPECTOMY    . TOTAL KNEE ARTHROPLASTY Right 05/19/2015   Procedure: RIGHT TOTAL KNEE ARTHROPLASTY;  Surgeon: Paralee Cancel, MD;  Location: [facility];  Service: Orthopedics;  Laterality: Right;       Home Medications    Prior to Admission medications   Medication Sig Start Date End Date Taking? Authorizing Provider  acetaminophen (TYLENOL) 325 MG tablet Take 1-2 tablets (325-650 mg total) by mouth every 6 (six) hours as needed for mild pain or moderate pain. 08/18/17   Domenic Moras, PA-C  albuterol (PROVENTIL) (2.5 MG/3ML) 0.083% nebulizer solution Take 3 mLs (2.5 mg total) by nebulization every 6 (six) hours as needed for wheezing or shortness of breath. 01/21/17   Sid Falcon, MD  albuterol (VENTOLIN HFA) 108 (90 Base) MCG/ACT inhaler Inhale 2 puffs into the lungs every 4 (four) hours as needed for wheezing or shortness of breath. 12/22/17   [name], NP  amLODipine (NORVASC) 5 MG tablet TAKE ONE (1) TABLET  BY MOUTH EVERY DAY 08/29/17   [name], NP  azithromycin (ZITHROMAX Z-PAK) 250 MG tablet 2 tabs by mouth today, then 1 tab once daily for 4 more days 12/01/17   [name], NP  benazepril (LOTENSIN) 10 MG tablet TAKE 1 TABLET(10 MG) BY MOUTH DAILY 09/14/15   [name], NP  EVZIO 2 MG/0.4ML [facility] Call 911. Inject 1 auto-injector intramuscularly in shoulder or thigh. Repeat every 3 minutes as needed if no or minimal response. 06/19/17   [provider]  Fluticasone-Salmeterol (ADVAIR DISKUS) 250-50 MCG/DOSE AEPB INHALE 1 PUFF INTO THE LUNGS TWICE DAILY 02/27/17   [name], NP  furosemide (LASIX) 20 MG tablet Take 1 tablet (20 mg total) by mouth daily as needed. 09/18/17   [name], NP  gabapentin (NEURONTIN) 300 MG capsule Take 300 mg by mouth 3 (three) times daily.  09/18/15   [provider]  levothyroxine (SYNTHROID, LEVOTHROID) 200 MCG tablet TAKE ONE (1) TABLET EACH DAY BEFORE BREAKFAST 08/29/17   [name], NP  lovastatin (MEVACOR) 40 MG tablet TAKE ONE TABLET BY MOUTH EVERY NIGHT AT BEDTIME 08/29/17   [name], NP  methocarbamol (ROBAXIN) 500 MG tablet Take 1 tablet (500 mg total) by mouth every 6 (six) hours as needed for muscle spasms. 05/19/15   Danae Orleans, PA-C  montelukast (SINGULAIR) 10 MG tablet TAKE ONE TABLET EVERY MORNING 12/01/17   [name], NP  omeprazole (PRILOSEC) 40 MG capsule TAKE ONE (1) CAPSULE EACH DAY 12/01/17   [name], NP  oxyCODONE-acetaminophen (PERCOCET) 7.5-325 MG tablet Take 1 tablet by mouth 2 (two) times daily. 08/04/17   [provider]  pantoprazole (PROTONIX) 40 MG tablet Take 1 tablet (40 mg total) by mouth daily. Take daily before breakfast on an empty stomach. 10/26/17   Pyrtle, Lajuan Lines, MD  predniSONE (DELTASONE) 10 MG tablet 3 tabs by mouth once daily for 3 days, then 2 tabs once daily for 3 days, then 1 tab once daily for 3 days 12/01/17   [name], NP  tamsulosin (FLOMAX) 0.4 MG CAPS capsule Take 0.4 mg by mouth at bedtime. 08/29/17   [provider]  varenicline (CHANTIX STARTING MONTH PAK) 0.5 MG X 11 & 1 MG X 42 tablet Take 0.5mg  tab PO daily for 3 days, then increase to one 0.5 mg tablet twice daily for 4 days, then increase to one 1 mg tablet twice daily. 09/25/17   [name], NP  XTAMPZA ER 18 MG C12A Take 1 capsule by mouth 2 (two) times daily. 08/04/17   [provider]    Family History Family History  Problem Relation Age of Onset  . Heart disease Mother        pacemaker  . Diabetes Father   . Kidney  disease Father   . Diabetes Sister   . Kidney disease Sister   . Colon cancer Neg Hx   . Esophageal cancer Neg Hx   . Rectal cancer Neg Hx   . Stomach cancer Neg Hx   . Prostate cancer Neg Hx   . Pancreatic cancer Neg Hx     Social History Social History   Tobacco Use  . Smoking status: Current Every Day Smoker    Packs/day: 6.00    Years: 20.00    Pack years: 120.00    Types: Cigarettes  . Smokeless tobacco: Never Used  Substance Use Topics  . Alcohol use: No    Alcohol/week: 0.0 oz  . Drug use: No  Allergies   Aspirin; Doxycycline hyclate; Ibuprofen; Peanut butter flavor; and Cephalexin   Review of Systems Review of Systems  All other systems reviewed and are negative.    Physical Exam Updated Vital Signs Temp 98 F (36.7 C)   Physical Exam  Constitutional: He is oriented to person, place, and time. He appears well-developed and well-nourished.  HENT:  Head: Normocephalic and atraumatic.  Eyes: Conjunctivae and EOM are normal. Pupils are equal, round, and reactive to light. Right eye exhibits no discharge. Left eye exhibits no discharge. No scleral icterus.  No disconjugate gaze Diplopia with both eyes open, vision is normal with one eye covered  Neck: Normal range of motion. Neck supple. No JVD present.  Cardiovascular: Normal rate, regular rhythm and normal heart sounds. Exam reveals no gallop and no friction rub.  No murmur heard. Pulmonary/Chest: Effort normal. No respiratory distress. He has wheezes. He has no rales. He exhibits no tenderness.  Abdominal: Soft. He exhibits no distension and no mass. There is no tenderness. There is no rebound and no guarding.  Musculoskeletal: Normal range of motion. He exhibits no edema or tenderness.  Neurological: He is alert and oriented to person, place, and time.  Skin: Skin is warm and dry.  Psychiatric: He has a normal mood and affect. His behavior is normal. Judgment and thought content normal.  Nursing  note and vitals reviewed.    ED Treatments / Results  Labs (all labs ordered are listed, but only abnormal results are displayed) Labs Reviewed  COMPREHENSIVE METABOLIC PANEL - Abnormal; Notable for the following components:      Result Value   Sodium 133 (*)    Chloride 93 (*)    Glucose, Bld 152 (*)    Calcium 8.7 (*)    All other components within normal limits  AMMONIA - Abnormal; Notable for the following components:   Ammonia 52 (*)    All other components within normal limits  ACETAMINOPHEN LEVEL - Abnormal; Notable for the following components:   Acetaminophen (Tylenol), Serum <10 (*)    All other components within normal limits  RAPID URINE DRUG SCREEN, HOSP PERFORMED - Abnormal; Notable for the following components:   Opiates POSITIVE (*)    All other components within normal limits  URINALYSIS, ROUTINE W REFLEX MICROSCOPIC - Abnormal; Notable for the following components:   Color, Urine STRAW (*)    Hgb urine dipstick SMALL (*)    All other components within normal limits  CBG MONITORING, ED - Abnormal; Notable for the following components:   Glucose-Capillary 144 (*)    All other components within normal limits  CBC WITH DIFFERENTIAL/PLATELET  ETHANOL  SALICYLATE LEVEL  BRAIN NATRIURETIC PEPTIDE  I-STAT TROPONIN, ED    EKG  EKG Interpretation  Date/Time:  Tuesday January 02 2018 22:52:13 EST Ventricular Rate:  85 PR Interval:  132 QRS Duration: 100 QT Interval:  378 QTC Calculation: 449 R Axis:   38 Text Interpretation:  Normal sinus rhythm Possible Inferior infarct , age undetermined Abnormal ECG Confirmed by Sherwood Gambler [phone]) on 01/02/2018 11:04:31 PM       Radiology Dg Chest 2 View  Result Date: 01/02/2018 CLINICAL DATA:  Increased confusion after nap today. EXAM: CHEST  2 VIEW COMPARISON:  02/20/2017 FINDINGS: Mildly enlarged cardiac silhouette and stable prominence of the aortic arch. Slightly low lung volumes with interstitial edema and  central vascular congestion. Subsegmental atelectasis and/or scarring is seen along the periphery of the lung bases. IMPRESSION: Stable mild cardiomegaly  with central vascular congestion and mild interstitial edema. Electronically Signed   By: Ashley Royalty M.D.   On: 01/02/2018 23:59   Ct Head Wo Contrast  Result Date: 01/03/2018 CLINICAL DATA:  Diplopia, dizziness and altered sensation about the lips. EXAM: CT HEAD WITHOUT CONTRAST TECHNIQUE: Contiguous axial images were obtained from the base of the skull through the vertex without intravenous contrast. COMPARISON:  None. FINDINGS: Brain: Subtle area of hypodensity with loss of gray-white distinction in the anterior right temporal lobe may be artifactual however MRI may help exclude the possibility of a subtle nonhemorrhagic infarct. No intra-axial mass nor extra-axial fluid collections. No hydrocephalus. No acute intracranial hemorrhage. Vascular: No hyperdense vessels. Mild atherosclerosis of the cavernous internal carotids. Skull: Negative Sinuses/Orbits: Right anterior ethmoid sinus mucosal thickening adjacent to large confluent area of inspissated mucous concretions and/or osteomas. Slightly hypoplastic right mastoid without mastoid effusions. Other: None IMPRESSION: 1. Subtle loss of gray-white matter distinction involving the right anterior temporal lobe. Given its location, findings could be artifactual due to the concavity of the skull base adjacent to this area. The possibility of a nonhemorrhagic infarct however is not entirely excluded. MRI may help resolve this. No hemorrhage or midline shift. 2. Stigmata of chronic ethmoid sinusitis. Electronically Signed   By: Ashley Royalty M.D.   On: 01/03/2018 00:58    Procedures Procedures (including critical care time)  Medications Ordered in ED Medications  ipratropium-albuterol (DUONEB) 0.5-2.5 (3) MG/3ML nebulizer solution 3 mL (not administered)  methylPREDNISolone sodium succinate (SOLU-MEDROL) 125  mg/2 mL injection 125 mg (not administered)     Initial Impression / Assessment and Plan / ED Course  I have reviewed the triage vital signs and the nursing notes.  Pertinent labs & imaging results that were available during my care of the patient were reviewed by me and considered in my medical decision making (see chart for details).    Patient with multiple complaints including confusion, diplopia, urinary incontinence, and wheezing.    Question whether the patient had a seizure and was post-ictal and had urinary incontinence.  Patient's wife states that he has no history of seizures.  Patient seen by and discussed with Dr. Randal Buba, who recommends admission for altered mental status and pulmonary edema likely secondary to drug overdose.  Additionally, possible infract seen on head CT.   I discussed this with Dr. Rory Percy, who recommends MRI brain.    Appreciate Dr. Blaine Hamper for admitting the patient.    Final Clinical Impressions(s) / ED Diagnoses   Final diagnoses:  Altered mental status, unspecified altered mental status type  Confusion    ED Discharge Orders    None       Montine Circle, PA-C 01/03/18 0147    Montine Circle, PA-C 01/04/18 2330    Palumbo, April, MD 01/04/18 [phone]

## 2018-01-03 ENCOUNTER — Observation Stay (HOSPITAL_COMMUNITY): Payer: Medicare HMO

## 2018-01-03 ENCOUNTER — Emergency Department (HOSPITAL_COMMUNITY): Payer: Medicare HMO

## 2018-01-03 DIAGNOSIS — N401 Enlarged prostate with lower urinary tract symptoms: Secondary | ICD-10-CM

## 2018-01-03 DIAGNOSIS — E785 Hyperlipidemia, unspecified: Secondary | ICD-10-CM | POA: Diagnosis not present

## 2018-01-03 DIAGNOSIS — G9341 Metabolic encephalopathy: Secondary | ICD-10-CM | POA: Diagnosis present

## 2018-01-03 DIAGNOSIS — K219 Gastro-esophageal reflux disease without esophagitis: Secondary | ICD-10-CM | POA: Diagnosis not present

## 2018-01-03 DIAGNOSIS — H532 Diplopia: Secondary | ICD-10-CM | POA: Diagnosis not present

## 2018-01-03 DIAGNOSIS — J45909 Unspecified asthma, uncomplicated: Secondary | ICD-10-CM | POA: Diagnosis not present

## 2018-01-03 DIAGNOSIS — N39498 Other specified urinary incontinence: Secondary | ICD-10-CM | POA: Diagnosis not present

## 2018-01-03 DIAGNOSIS — I10 Essential (primary) hypertension: Secondary | ICD-10-CM | POA: Diagnosis not present

## 2018-01-03 DIAGNOSIS — E039 Hypothyroidism, unspecified: Secondary | ICD-10-CM | POA: Diagnosis not present

## 2018-01-03 DIAGNOSIS — Z720 Tobacco use: Secondary | ICD-10-CM | POA: Diagnosis present

## 2018-01-03 DIAGNOSIS — M25561 Pain in right knee: Secondary | ICD-10-CM

## 2018-01-03 DIAGNOSIS — I639 Cerebral infarction, unspecified: Secondary | ICD-10-CM | POA: Diagnosis present

## 2018-01-03 HISTORY — DX: Metabolic encephalopathy: G93.41

## 2018-01-03 LAB — LIPID PANEL
Cholesterol: 176 mg/dL (ref 0–200)
HDL: 56 mg/dL (ref >40)
LDL CALC: 99 mg/dL (ref 0–99)
TRIGLYCERIDES: 105 mg/dL (ref <150)
Total CHOL/HDL Ratio: 3.1 RATIO
VLDL: 21 mg/dL (ref 0–40)

## 2018-01-03 LAB — ETHANOL: Alcohol, Ethyl (B): <10 mg/dL (ref <10)

## 2018-01-03 LAB — SALICYLATE LEVEL: Salicylate Lvl: <7 mg/dL (ref 2.8–30.0)

## 2018-01-03 LAB — URINALYSIS, ROUTINE W REFLEX MICROSCOPIC
BACTERIA UA: NONE SEEN
BILIRUBIN URINE: NEGATIVE
Glucose, UA: NEGATIVE mg/dL
Ketones, ur: NEGATIVE mg/dL
Leukocytes, UA: NEGATIVE
Nitrite: NEGATIVE
Protein, ur: NEGATIVE mg/dL
SPECIFIC GRAVITY, URINE: 1.009 (ref 1.005–1.030)
SQUAMOUS EPITHELIAL / LPF: NONE SEEN
pH: 5 (ref 5.0–8.0)

## 2018-01-03 LAB — RAPID URINE DRUG SCREEN, HOSP PERFORMED
AMPHETAMINES: NOT DETECTED
Barbiturates: NOT DETECTED
Benzodiazepines: NOT DETECTED
COCAINE: NOT DETECTED
OPIATES: POSITIVE — AB
TETRAHYDROCANNABINOL: NOT DETECTED

## 2018-01-03 LAB — HEMOGLOBIN A1C
HEMOGLOBIN A1C: 5.2 % (ref 4.8–5.6)
MEAN PLASMA GLUCOSE: 102.54 mg/dL

## 2018-01-03 LAB — TSH: TSH: 3.261 u[IU]/mL (ref 0.350–4.500)

## 2018-01-03 LAB — ACETAMINOPHEN LEVEL

## 2018-01-03 LAB — BRAIN NATRIURETIC PEPTIDE: B NATRIURETIC PEPTIDE 5: 20 pg/mL (ref 0.0–100.0)

## 2018-01-03 LAB — CBG MONITORING, ED: GLUCOSE-CAPILLARY: 144 mg/dL — AB (ref 65–99)

## 2018-01-03 MED ORDER — ALBUTEROL SULFATE (2.5 MG/3ML) 0.083% IN NEBU: 2.5000 mg | INHALATION_SOLUTION | RESPIRATORY_TRACT | Status: DC | PRN

## 2018-01-03 MED ORDER — NICOTINE 21 MG/24HR TD PT24
21.0000 mg | MEDICATED_PATCH | Freq: Every day | TRANSDERMAL | Status: DC
  Administered 2018-01-03: 21 mg via TRANSDERMAL
  Filled 2018-01-03: qty 1

## 2018-01-03 MED ORDER — TAMSULOSIN HCL 0.4 MG PO CAPS: 0.4000 mg | ORAL_CAPSULE | Freq: Every day | ORAL | Status: DC

## 2018-01-03 MED ORDER — ZOLPIDEM TARTRATE 5 MG PO TABS: 5.0000 mg | ORAL_TABLET | Freq: Every evening | ORAL | Status: DC | PRN

## 2018-01-03 MED ORDER — ONDANSETRON HCL 4 MG/2ML IJ SOLN: 4.0000 mg | Freq: Three times a day (TID) | INTRAMUSCULAR | Status: DC | PRN

## 2018-01-03 MED ORDER — GABAPENTIN 300 MG PO CAPS: 300.0000 mg | ORAL_CAPSULE | Freq: Three times a day (TID) | ORAL | Status: DC

## 2018-01-03 MED ORDER — DM-GUAIFENESIN ER 30-600 MG PO TB12: 1.0000 | ORAL_TABLET | Freq: Two times a day (BID) | ORAL | Status: DC | PRN

## 2018-01-03 MED ORDER — MOMETASONE FURO-FORMOTEROL FUM 200-5 MCG/ACT IN AERO
2.0000 | INHALATION_SPRAY | Freq: Two times a day (BID) | RESPIRATORY_TRACT | Status: DC
  Filled 2018-01-03: qty 8.8

## 2018-01-03 MED ORDER — FUROSEMIDE 20 MG PO TABS: 20.0000 mg | ORAL_TABLET | Freq: Every day | ORAL | Status: DC

## 2018-01-03 MED ORDER — PRAVASTATIN SODIUM 40 MG PO TABS: 40.0000 mg | ORAL_TABLET | Freq: Every day | ORAL | Status: DC

## 2018-01-03 MED ORDER — METHYLPREDNISOLONE SODIUM SUCC 125 MG IJ SOLR
60.0000 mg | Freq: Every day | INTRAMUSCULAR | Status: DC
  Administered 2018-01-03: 60 mg via INTRAVENOUS
  Filled 2018-01-03: qty 2

## 2018-01-03 MED ORDER — HYDRALAZINE HCL 20 MG/ML IJ SOLN: 5.0000 mg | INTRAMUSCULAR | Status: DC | PRN

## 2018-01-03 MED ORDER — METHOCARBAMOL 500 MG PO TABS
500.0000 mg | ORAL_TABLET | Freq: Four times a day (QID) | ORAL | Status: DC | PRN
  Administered 2018-01-03: 500 mg via ORAL
  Filled 2018-01-03: qty 1

## 2018-01-03 MED ORDER — STROKE: EARLY STAGES OF RECOVERY BOOK
Freq: Once | Status: DC
  Filled 2018-01-03: qty 1

## 2018-01-03 MED ORDER — PANTOPRAZOLE SODIUM 40 MG PO TBEC
40.0000 mg | DELAYED_RELEASE_TABLET | Freq: Every day | ORAL | Status: DC
  Administered 2018-01-03: 40 mg via ORAL
  Filled 2018-01-03: qty 1

## 2018-01-03 MED ORDER — AMLODIPINE BESYLATE 5 MG PO TABS
5.0000 mg | ORAL_TABLET | Freq: Every day | ORAL | Status: DC
  Administered 2018-01-03: 5 mg via ORAL
  Filled 2018-01-03: qty 1

## 2018-01-03 MED ORDER — MONTELUKAST SODIUM 10 MG PO TABS: 10.0000 mg | ORAL_TABLET | Freq: Every day | ORAL | Status: DC

## 2018-01-03 MED ORDER — ACETAMINOPHEN 500 MG PO TABS: 500.0000 mg | ORAL_TABLET | Freq: Four times a day (QID) | ORAL | Status: DC | PRN

## 2018-01-03 MED ORDER — IPRATROPIUM-ALBUTEROL 0.5-2.5 (3) MG/3ML IN SOLN
3.0000 mL | RESPIRATORY_TRACT | Status: DC
  Administered 2018-01-03: 3 mL via RESPIRATORY_TRACT
  Filled 2018-01-03 (×2): qty 3

## 2018-01-03 MED ORDER — ENOXAPARIN SODIUM 40 MG/0.4ML ~~LOC~~ SOLN: 40.0000 mg | SUBCUTANEOUS | Status: DC

## 2018-01-03 MED ORDER — LEVOTHYROXINE SODIUM 200 MCG PO TABS
200.0000 ug | ORAL_TABLET | Freq: Every day | ORAL | Status: DC
  Administered 2018-01-03: 200 ug via ORAL
  Filled 2018-01-03: qty 1

## 2018-01-03 MED ORDER — BENAZEPRIL HCL 10 MG PO TABS
10.0000 mg | ORAL_TABLET | Freq: Every day | ORAL | Status: DC
  Filled 2018-01-03: qty 1

## 2018-01-03 NOTE — H&P (Signed)
History and Physical    Kyle Blair AOZ:[id number] DOB: 1961-08-04 DOA: 01/02/2018  Referring MD/NP/PA:   PCP: [name], NP   se, who presents with confusion. Patient coming from:  The patient is coming from home.  At baseline, pt is independent for most of ADL.  Chief Complaint: confusion, double vision  HPI: Kyle Blair is a 57 y.o. male with medical history significant of stroke, HLD, HTN, asthma, hypothyroid, polycystic kidney disease, GERD, BPH, tobacco abuse, who presents with confusion the double vision.   Per his wife, pt took a nap at 15:00 and woke up with confusion at 19:30 today. His wife reports that pt has been having double vision last night and in this afternoon again. Pt had urinary incontinence. Pt has abnormal gait and slow and mild slurred speech from previous stroke, which have not changed. Patient does not have hearing loss, new unilateral weakness or numbness in extremities. Patient denies any chest pain. He has mild dry cough and mild SOB. No fever or chills. Patient is chronic diarrhea, which has not change. She has mild nausea, no vomiting or abdominal pain. Denies symptoms of UTI. Pt is taking percocet and Xtampza for right knee pain.  ED Course: pt was found to have WBC 7.9, negative troponin, BNP 28, electrolytes renal function okay, temperature normal, no tachycardia, O2 sats is 97% on room air, positive UDS for opiate. Chest x-ray showed a mild interstitial edema. Patient is placed on telemetry bed for observation. Neurology, Dr. Rory Percy was consulted.  # CT-head showed 1. Subtle loss of gray-white matter distinction involving the right anterior temporal lobe. Given its location, findings could be artifactual due to the concavity of the skull base adjacent to this area. The possibility of a nonhemorrhagic infarct however is not entirely excluded. MRI may help resolve this. No hemorrhage or midline shift. 2. Stigmata of chronic ethmoid  sinusitis.   Review of Systems:   General: no fevers, chills, no body weight gain, has fatigue HEENT: has double vision, no hearing changes or sore throat Respiratory: no dyspnea, coughing, wheezing CV: no chest pain, no palpitations GI: has nausea, diarrhea, no constipation, vomiting, abdominal pain,  GU: no dysuria, burning on urination, increased urinary frequency, hematuria. Has urinary incontinence.  Ext: no leg edema Neuro: no unilateral weakness, numbness, or tingling, no hearing loss. Has confusion, double vision. Skin: no rash, no skin tear. MSK: No muscle spasm, no deformity, no limitation of range of movement in spin Heme: No easy bruising.  Travel history: No recent long distant travel.  Allergy:  Allergies  Allergen Reactions  . Aspirin Anaphylaxis  . Doxycycline Hyclate Swelling    Facial Swelling  . Ibuprofen Anaphylaxis  . Peanut Butter Flavor Anaphylaxis  . Cephalexin Rash    Past Medical History:  Diagnosis Date  . Arthritis    severe; pt takes strong pain meds daily/legs  . Asthma   . BPH (benign prostatic hypertrophy)   . GERD with stricture   . History of stroke 2004  . Hyperlipidemia   . Hypertension   . Hypothyroid   . Lung nodule 02/03/2012  . Polycystic kidney disease   . Stroke [facility])     Past Surgical History:  Procedure Laterality Date  . CARPAL TUNNEL RELEASE    . COLONOSCOPY    . HERNIA REPAIR  2005   with mesh  . KNEE ARTHROSCOPY Bilateral 12/05/13   cyst and bone spur removed from left knee per pt.  Marland Kitchen KNEE ARTHROSCOPY Left 01/2015  .  NASAL POLYP EXCISION  1992 or 93  . POLYPECTOMY    . TOTAL KNEE ARTHROPLASTY Right 05/19/2015   Procedure: RIGHT TOTAL KNEE ARTHROPLASTY;  Surgeon: Paralee Cancel, MD;  Location: [facility] OR[facility];  Service: Orthopedics;  Laterality: Right;    Social History:  reports that he has been smoking cigarettes.  He has a 120.00 pack-year smoking history. he has never used smokeless tobacco. He reports that he does not  drink alcohol or use drugs.  Family History:  Family History  Problem Relation Age of Onset  . Heart disease Mother        pacemaker  . Diabetes Father   . Kidney disease Father   . Diabetes Sister   . Kidney disease Sister   . Colon cancer Neg Hx   . Esophageal cancer Neg Hx   . Rectal cancer Neg Hx   . Stomach cancer Neg Hx   . Prostate cancer Neg Hx   . Pancreatic cancer Neg Hx      Prior to Admission medications   Medication Sig Start Date End Date Taking? Authorizing Provider  acetaminophen (TYLENOL) 325 MG tablet Take 1-2 tablets (325-650 mg total) by mouth every 6 (six) hours as needed for mild pain or moderate pain. 08/18/17   Domenic Moras, PA-C  albuterol (PROVENTIL) (2.5 MG/3ML) 0.083% nebulizer solution Take 3 mLs (2.5 mg total) by nebulization every 6 (six) hours as needed for wheezing or shortness of breath. 01/21/17   Sid Falcon, MD  albuterol (VENTOLIN HFA) 108 (90 Base) MCG/ACT inhaler Inhale 2 puffs into the lungs every 4 (four) hours as needed for wheezing or shortness of breath. 12/22/17   [name], NP  amLODipine (NORVASC) 5 MG tablet TAKE ONE (1) TABLET BY MOUTH EVERY DAY 08/29/17   [name], NP  azithromycin (ZITHROMAX Z-PAK) 250 MG tablet 2 tabs by mouth today, then 1 tab once daily for 4 more days 12/01/17   [name], NP  benazepril (LOTENSIN) 10 MG tablet TAKE 1 TABLET(10 MG) BY MOUTH DAILY 09/14/15   [name], NP  EVZIO 2 MG/0.4ML [facility] Call 911. Inject 1 auto-injector intramuscularly in shoulder or thigh. Repeat every 3 minutes as needed if no or minimal response. 06/19/17   [provider]  Fluticasone-Salmeterol (ADVAIR DISKUS) 250-50 MCG/DOSE AEPB INHALE 1 PUFF INTO THE LUNGS TWICE DAILY 02/27/17   [name], NP  furosemide (LASIX) 20 MG tablet Take 1 tablet (20 mg total) by mouth daily as needed. 09/18/17   [name], NP  gabapentin (NEURONTIN) 300 MG capsule Take 300 mg by mouth 3 (three)  times daily.  09/18/15   [provider]  levothyroxine (SYNTHROID, LEVOTHROID) 200 MCG tablet TAKE ONE (1) TABLET EACH DAY BEFORE BREAKFAST 08/29/17   [name], NP  lovastatin (MEVACOR) 40 MG tablet TAKE ONE TABLET BY MOUTH EVERY NIGHT AT BEDTIME 08/29/17   [name], NP  methocarbamol (ROBAXIN) 500 MG tablet Take 1 tablet (500 mg total) by mouth every 6 (six) hours as needed for muscle spasms. 05/19/15   Danae Orleans, PA-C  montelukast (SINGULAIR) 10 MG tablet TAKE ONE TABLET EVERY MORNING 12/01/17   [name], NP  omeprazole (PRILOSEC) 40 MG capsule TAKE ONE (1) CAPSULE EACH DAY 12/01/17   [name], NP  oxyCODONE-acetaminophen (PERCOCET) 7.5-325 MG tablet Take 1 tablet by mouth 2 (two) times daily. 08/04/17   [provider]  pantoprazole (PROTONIX) 40 MG tablet Take 1 tablet (40 mg total) by mouth daily. Take daily before breakfast on  an empty stomach. 10/26/17   Pyrtle, Lajuan Lines, MD  predniSONE (DELTASONE) 10 MG tablet 3 tabs by mouth once daily for 3 days, then 2 tabs once daily for 3 days, then 1 tab once daily for 3 days 12/01/17   [name], NP  tamsulosin (FLOMAX) 0.4 MG CAPS capsule Take 0.4 mg by mouth at bedtime. 08/29/17   [provider]  varenicline (CHANTIX STARTING MONTH PAK) 0.5 MG X 11 & 1 MG X 42 tablet Take 0.5mg  tab PO daily for 3 days, then increase to one 0.5 mg tablet twice daily for 4 days, then increase to one 1 mg tablet twice daily. 09/25/17   [name], NP  XTAMPZA ER 18 MG C12A Take 1 capsule by mouth 2 (two) times daily. 08/04/17   [provider]    Physical Exam: Vitals:   01/02/18 2250 01/02/18 2300 01/03/18 0207  BP:  (!) 171/101 126/76  Pulse:  91 76  Resp:  16 14  Temp: 98 F (36.7 C) 98 F (36.7 C)   TempSrc:  Oral   SpO2:  97% 95%   General: Not in acute distress HEENT:       Eyes: PERRL, EOMI, no scleral icterus.       ENT: No discharge from the ears and nose, no  pharynx injection, no tonsillar enlargement.        Neck: No JVD, no bruit, no mass felt. Heme: No neck lymph node enlargement. Cardiac: S1/S2, RRR, No murmurs, No gallops or rubs. Respiratory: has mild wheezing GI: Soft, nondistended, nontender, no rebound pain, no organomegaly, BS present. GU: No hematuria Ext: No pitting leg edema bilaterally. 2+DP/PT pulse bilaterally. Musculoskeletal: No joint deformities, No joint redness or warmth, no limitation of ROM in spin. Skin: No rashes.  Neuro: mildly confused, but still oriented X3, cranial nerves II-XII grossly intact, moves all extremities normally. Muscle strength 5/5 in all extremities, sensation to light touch intact. Brachial reflex 2+ bilaterally. Negative Babinski's sign Psych: Patient is not psychotic, no suicidal or hemocidal ideation.  Labs on Admission: I have personally reviewed following labs and imaging studies  CBC: Recent Labs  Lab 01/02/18 2250  WBC 7.9  NEUTROABS 5.8  HGB 16.0  HCT 46.5  MCV 89.6  PLT 992   Basic Metabolic Panel: Recent Labs  Lab 01/02/18 2250  NA 133*  K 3.6  CL 93*  CO2 30  GLUCOSE 152*  BUN 13  CREATININE 0.86  CALCIUM 8.7*   GFR: CrCl cannot be calculated (Unknown ideal weight.). Liver Function Tests: Recent Labs  Lab 01/02/18 2250  AST 22  ALT 17  ALKPHOS 87  BILITOT 0.6  PROT 7.1  ALBUMIN 4.2   No results for input(s): LIPASE, AMYLASE in the last 168 hours. Recent Labs  Lab 01/02/18 2300  AMMONIA 52*   Coagulation Profile: No results for input(s): INR, PROTIME in the last 168 hours. Cardiac Enzymes: No results for input(s): CKTOTAL, CKMB, CKMBINDEX, TROPONINI in the last 168 hours. BNP (last 3 results) No results for input(s): PROBNP in the last 8760 hours. HbA1C: No results for input(s): HGBA1C in the last 72 hours. CBG: Recent Labs  Lab 01/03/18 0029  GLUCAP 144*   Lipid Profile: No results for input(s): CHOL, HDL, LDLCALC, TRIG, CHOLHDL, LDLDIRECT in  the last 72 hours. Thyroid Function Tests: No results for input(s): TSH, T4TOTAL, FREET4, T3FREE, THYROIDAB in the last 72 hours. Anemia Panel: No results for input(s): VITAMINB12, FOLATE, FERRITIN, TIBC, IRON, RETICCTPCT in the  last 72 hours. Urine analysis:    Component Value Date/Time   COLORURINE STRAW (A) 01/03/2018 0010   APPEARANCEUR CLEAR 01/03/2018 0010   LABSPEC 1.009 01/03/2018 0010   PHURINE 5.0 01/03/2018 0010   GLUCOSEU NEGATIVE 01/03/2018 0010   GLUCOSEU NEGATIVE 04/15/2015 0945   HGBUR SMALL (A) 01/03/2018 0010   BILIRUBINUR NEGATIVE 01/03/2018 0010   BILIRUBINUR negative 09/18/2017 1403   KETONESUR NEGATIVE 01/03/2018 0010   PROTEINUR NEGATIVE 01/03/2018 0010   UROBILINOGEN 0.2 09/18/2017 1403   UROBILINOGEN 0.2 05/14/2015 1015   NITRITE NEGATIVE 01/03/2018 0010   LEUKOCYTESUR NEGATIVE 01/03/2018 0010   Sepsis Labs: @LABRCNTIP (procalcitonin:4,lacticidven:4) )No results found for this or any previous visit (from the past 240 hour(s)).   Radiological Exams on Admission: Dg Chest 2 View  Result Date: 01/02/2018 CLINICAL DATA:  Increased confusion after nap today. EXAM: CHEST  2 VIEW COMPARISON:  02/20/2017 FINDINGS: Mildly enlarged cardiac silhouette and stable prominence of the aortic arch. Slightly low lung volumes with interstitial edema and central vascular congestion. Subsegmental atelectasis and/or scarring is seen along the periphery of the lung bases. IMPRESSION: Stable mild cardiomegaly with central vascular congestion and mild interstitial edema. Electronically Signed   By: Ashley Royalty M.D.   On: 01/02/2018 23:59   Ct Head Wo Contrast  Result Date: 01/03/2018 CLINICAL DATA:  Diplopia, dizziness and altered sensation about the lips. EXAM: CT HEAD WITHOUT CONTRAST TECHNIQUE: Contiguous axial images were obtained from the base of the skull through the vertex without intravenous contrast. COMPARISON:  None. FINDINGS: Brain: Subtle area of hypodensity with loss of  gray-white distinction in the anterior right temporal lobe may be artifactual however MRI may help exclude the possibility of a subtle nonhemorrhagic infarct. No intra-axial mass nor extra-axial fluid collections. No hydrocephalus. No acute intracranial hemorrhage. Vascular: No hyperdense vessels. Mild atherosclerosis of the cavernous internal carotids. Skull: Negative Sinuses/Orbits: Right anterior ethmoid sinus mucosal thickening adjacent to large confluent area of inspissated mucous concretions and/or osteomas. Slightly hypoplastic right mastoid without mastoid effusions. Other: None IMPRESSION: 1. Subtle loss of gray-white matter distinction involving the right anterior temporal lobe. Given its location, findings could be artifactual due to the concavity of the skull base adjacent to this area. The possibility of a nonhemorrhagic infarct however is not entirely excluded. MRI may help resolve this. No hemorrhage or midline shift. 2. Stigmata of chronic ethmoid sinusitis. Electronically Signed   By: Ashley Royalty M.D.   On: 01/03/2018 00:58     EKG: Independently reviewed. Sinus rhythm, QTC 449, q wave in inferior leads.  Assessment/Plan Principal Problem:   Acute metabolic encephalopathy Active Problems:   Hypothyroid   Asthma   Benign prostatic hyperplasia   Hyperlipidemia   GERD (gastroesophageal reflux disease)   Arthralgia of right knee   HTN (hypertension)   Stroke (cerebrum) (HCC)   Tobacco abuse   Acute metabolic encephalopathy: Etiology is not clear. CT of the head showed some abnormalities, but not a very confirmative. Differential diagnoses include TIA/stroke versus pain medication overdose. Neurology, Dr. Rory Percy was consulted, who recommended MRI of brain.  - will place on tele bed for obs - will follow up Neurology's Recs.  - Obtain MRI of brain - fasting lipid panel and HbA1c  - fall precaution - PT/OT consult  BPH: stable - Continue Flomax   Hypothyroidism: Last TSH was  15.31 on 02/27/17 -Continue home Synthroid -Check TSH  Asthma: pt has mild dry cough and SOB, with mild wheezing, indicating mild exacerbation. -solumedrol 60 mg daily -  Mucinex for cough -DuoNeb nebulizer, when necessary albuterol nebulizer -continue Singulair - Dulera inhaler,  Hyperlipidemia: -Pravastatin   GERD: -Protonix  Arthralgia of right knee: -prn tylenol -hold percocet and Xtampza due to possibility of overdose -Continue Neurontin and Robaxin  HTN:  -Continue home medications: Amlodipine, lasix -IV hydralazine prn  Hx of stroke (cerebrum) ([place]): - on statin  Tobacco abuse: -nicotine patch   DVT ppx: SQ Lovenox Code Status: Full code Family Communication:    Yes, patient's  wife  at bed side Disposition Plan:  Anticipate discharge back to previous home environment Consults called:  Neuro, Dr. Rory Percy Admission status: Obs / tele    Date of Service 01/03/2018    Ivor Costa [facility] Pager [phone]  If 7PM-7AM, please contact night-coverage [email or web address] Password [email or web address] 01/03/2018, 2:16 AM

## 2018-01-03 NOTE — ED Notes (Signed)
Patient transported to CT SCAN . 

## 2018-01-03 NOTE — ED Notes (Addendum)
Pt assisted in changing into gown after wetting himself. Pt ambulates to bathroom on his own ability.

## 2018-01-03 NOTE — ED Notes (Signed)
Dr. Blaine Hamper ( admitting MD ) at bedside .

## 2018-01-03 NOTE — Progress Notes (Signed)
PT Cancellation Note  Patient Details Name: Iden Stripling MRN: [id number] DOB: 04-30-1961   Cancelled Treatment:    Reason Eval/Treat Not Completed: PT screened, no needs identified, will sign off Per notes, pt ambulating well and per RN, pt getting ready to d/c home. No skilled needs identified. Will sign off. If needs change, please re-consult.   Leighton Ruff, PT, DPT  Acute Rehabilitation Services  Pager: [phone]    Rudean Hitt 01/03/2018, 11:18 AM

## 2018-01-03 NOTE — Discharge Instructions (Signed)
Follow with O'Sullivan, Melissa, NP in 5-7 days ° °Please get a complete blood count and chemistry panel checked by your Primary MD at your next visit, and again as instructed by your Primary MD. Please get your medications reviewed and adjusted by your Primary MD. ° °Please request your Primary MD to go over all Hospital Tests and Procedure/Radiological results at the follow up, please get all Hospital records sent to your Prim MD by signing hospital release before you go home. ° °If you had Pneumonia of Lung problems at the Hospital: °Please get a 2 view Chest X ray done in 6-8 weeks after hospital discharge or sooner if instructed by your Primary MD. ° °If you have Congestive Heart Failure: °Please call your Cardiologist or Primary MD anytime you have any of the following symptoms:  °1) 3 pound weight gain in 24 hours or 5 pounds in 1 week  °2) shortness of breath, with or without a dry hacking cough  °3) swelling in the hands, feet or stomach  °4) if you have to sleep on extra pillows at night in order to breathe ° °Follow cardiac low salt diet and 1.5 lit/day fluid restriction. ° °If you have diabetes °Accuchecks 4 times/day, Once in AM empty stomach and then before each meal. °Log in all results and show them to your primary doctor at your next visit. °If any glucose reading is under 80 or above 300 call your primary MD immediately. ° °If you have Seizure/Convulsions/Epilepsy: °Please do not drive, operate heavy machinery, participate in activities at heights or participate in high speed sports until you have seen by Primary MD or a Neurologist and advised to do so again. ° °If you had Gastrointestinal Bleeding: °Please ask your Primary MD to check a complete blood count within one week of discharge or at your next visit. Your endoscopic/colonoscopic biopsies that are pending at the time of discharge, will also need to followed by your Primary MD. ° °Get Medicines reviewed and adjusted. °Please take all your  medications with you for your next visit with your Primary MD ° °Please request your Primary MD to go over all hospital tests and procedure/radiological results at the follow up, please ask your Primary MD to get all Hospital records sent to his/her office. ° °If you experience worsening of your admission symptoms, develop shortness of breath, life threatening emergency, suicidal or homicidal thoughts you must seek medical attention immediately by calling 911 or calling your MD immediately  if symptoms less severe. ° °You must read complete instructions/literature along with all the possible adverse reactions/side effects for all the Medicines you take and that have been prescribed to you. Take any new Medicines after you have completely understood and accpet all the possible adverse reactions/side effects.  ° °Do not drive or operate heavy machinery when taking Pain medications.  ° °Do not take more than prescribed Pain, Sleep and Anxiety Medications ° °Special Instructions: If you have smoked or chewed Tobacco  in the last 2 yrs please stop smoking, stop any regular Alcohol  and or any Recreational drug use. ° °Wear Seat belts while driving. ° °Please note °You were cared for by a hospitalist during your hospital stay. If you have any questions about your discharge medications or the care you received while you were in the hospital after you are discharged, you can call the unit and asked to speak with the hospitalist on call if the hospitalist that took care of you is not available. Once   you are discharged, your primary care physician will handle any further medical issues. Please note that NO REFILLS for any discharge medications will be authorized once you are discharged, as it is imperative that you return to your primary care physician (or establish a relationship with a primary care physician if you do not have one) for your aftercare needs so that they can reassess your need for medications and monitor your  lab values. ° °You can reach the hospitalist office at phone [phone] or fax [phone] °  °If you do not have a primary care physician, you can call [phone] for a physician referral. ° °Activity: As tolerated with Full fall precautions use walker/cane & assistance as needed ° °Diet: regular ° °Disposition Home ° ° °

## 2018-01-03 NOTE — Discharge Summary (Signed)
Physician Discharge Summary  [name] TGG:[id number] DOB: 11-08-1961 DOA: 01/02/2018  PCP: [name], NP  Admit date: 01/02/2018 Discharge date: 01/03/2018  Admitted From: home Disposition:  home  Recommendations for Outpatient Follow-up:  1. Follow up with PCP in 1-2 weeks  Home Health: none Equipment/Devices: none  Discharge Condition: stable CODE STATUS: Full code Diet recommendation: heart healthy  HPI: Per Dr. Blaine Hamper, Kyle Blair is a 57 y.o. male with medical history significant of stroke, HLD, HTN, asthma, hypothyroid, polycystic kidney disease, GERD, BPH, tobacco abuse, who presents with confusion the double vision. Per his wife, pt took a nap at 15:00 and woke up with confusion at 19:30 today. His wife reports that pt has been having double vision last night and in this afternoon again. Pt had urinary incontinence. Pt has abnormal gait and slow and mild slurred speech from previous stroke, which have not changed. Patient does not have hearing loss, new unilateral weakness or numbness in extremities. Patient denies any chest pain. He has mild dry cough and mild SOB. No fever or chills. Patient is chronic diarrhea, which has not change. She has mild nausea, no vomiting or abdominal pain. Denies symptoms of UTI. Pt is taking percocet and Xtampza for right knee pain.   Hospital Course: Transient confusion /diplopia -resolved, EDP discussed with neurology who recommended an MRI of the brain to rule out stroke, this was negative.  His symptoms have resolved, suspect likely in the setting of multiple opioid medications at home, this was discussed with patient, he will follow-up with his PCP as an outpatient but will recommend he comes off of these medications eventually.  Patient very insistent that he is going home, with negative MRI he will be discharged home in stable condition with outpatient follow-up. BPH -continue home medications Asthma -no wheezing, baseline, on  room air Hyperlipidemia -resume statin Right knee arthralgia -extensively counseled patient about home opioids and judicious use Hypertension -resume home medications History of stroke -continue statin, allergic to aspirin Tobacco abuse -nicotine patch  Discharge Diagnoses:  Principal Problem:   Acute metabolic encephalopathy Active Problems:   Hypothyroid   Asthma   Benign prostatic hyperplasia   Hyperlipidemia   GERD (gastroesophageal reflux disease)   Arthralgia of right knee   HTN (hypertension)   Stroke (cerebrum) (HCC)   Tobacco abuse     Discharge Instructions   Allergies as of 01/03/2018      Reactions   Aspirin Anaphylaxis   Doxycycline Hyclate Swelling   Facial Swelling   Ibuprofen Anaphylaxis   Peanut Butter Flavor Anaphylaxis   Cephalexin Rash      Medication List    TAKE these medications   acetaminophen 325 MG tablet Commonly known as:  TYLENOL Take 1-2 tablets (325-650 mg total) by mouth every 6 (six) hours as needed for mild pain or moderate pain.   albuterol (2.5 MG/3ML) 0.083% nebulizer solution Commonly known as:  PROVENTIL Take 3 mLs (2.5 mg total) by nebulization every 6 (six) hours as needed for wheezing or shortness of breath.   albuterol 108 (90 Base) MCG/ACT inhaler Commonly known as:  VENTOLIN HFA Inhale 2 puffs into the lungs every 4 (four) hours as needed for wheezing or shortness of breath.   amLODipine 5 MG tablet Commonly known as:  NORVASC TAKE ONE (1) TABLET BY MOUTH EVERY DAY   benazepril 10 MG tablet Commonly known as:  LOTENSIN TAKE 1 TABLET(10 MG) BY MOUTH DAILY   EVZIO 2 MG/0.4ML Soaj Generic drug:  Naloxone  HCl Call 911. Inject 1 auto-injector intramuscularly in shoulder or thigh. Repeat every 3 minutes as needed if no or minimal response.   Fluticasone-Salmeterol 250-50 MCG/DOSE Aepb Commonly known as:  ADVAIR DISKUS INHALE 1 PUFF INTO THE LUNGS TWICE DAILY   furosemide 20 MG tablet Commonly known as:   LASIX Take 1 tablet (20 mg total) by mouth daily as needed. What changed:  reasons to take this   gabapentin 300 MG capsule Commonly known as:  NEURONTIN Take 300 mg by mouth 3 (three) times daily.   levothyroxine 200 MCG tablet Commonly known as:  SYNTHROID, LEVOTHROID TAKE ONE (1) TABLET EACH DAY BEFORE BREAKFAST   lovastatin 40 MG tablet Commonly known as:  MEVACOR TAKE ONE TABLET BY MOUTH EVERY NIGHT AT BEDTIME   methocarbamol 500 MG tablet Commonly known as:  ROBAXIN Take 1 tablet (500 mg total) by mouth every 6 (six) hours as needed for muscle spasms. What changed:  when to take this   montelukast 10 MG tablet Commonly known as:  SINGULAIR TAKE ONE TABLET EVERY MORNING   omeprazole 40 MG capsule Commonly known as:  PRILOSEC TAKE ONE (1) CAPSULE EACH DAY   oxyCODONE-acetaminophen 7.5-325 MG tablet Commonly known as:  PERCOCET Take 1 tablet by mouth 2 (two) times daily.   pantoprazole 40 MG tablet Commonly known as:  PROTONIX Take 1 tablet (40 mg total) by mouth daily. Take daily before breakfast on an empty stomach.   tamsulosin 0.4 MG Caps capsule Commonly known as:  FLOMAX Take 0.4 mg by mouth at bedtime.   XTAMPZA ER 18 MG C12a Generic drug:  OxyCODONE ER Take 1 capsule by mouth 2 (two) times daily.      Follow-up Information    [name], NP. Schedule an appointment as soon as possible for a visit in 2 week(s).   Specialty:  Internal Medicine Contact information: [place] [address] [phone]           Consultations:  Neurology   Procedures/Studies:  None   Dg Chest 2 View  Result Date: 01/02/2018 CLINICAL DATA:  Increased confusion after nap today. EXAM: CHEST  2 VIEW COMPARISON:  02/20/2017 FINDINGS: Mildly enlarged cardiac silhouette and stable prominence of the aortic arch. Slightly low lung volumes with interstitial edema and central vascular congestion. Subsegmental atelectasis and/or  scarring is seen along the periphery of the lung bases. IMPRESSION: Stable mild cardiomegaly with central vascular congestion and mild interstitial edema. Electronically Signed   By: Ashley Royalty M.D.   On: 01/02/2018 23:59   Ct Head Wo Contrast  Result Date: 01/03/2018 CLINICAL DATA:  Diplopia, dizziness and altered sensation about the lips. EXAM: CT HEAD WITHOUT CONTRAST TECHNIQUE: Contiguous axial images were obtained from the base of the skull through the vertex without intravenous contrast. COMPARISON:  None. FINDINGS: Brain: Subtle area of hypodensity with loss of gray-white distinction in the anterior right temporal lobe may be artifactual however MRI may help exclude the possibility of a subtle nonhemorrhagic infarct. No intra-axial mass nor extra-axial fluid collections. No hydrocephalus. No acute intracranial hemorrhage. Vascular: No hyperdense vessels. Mild atherosclerosis of the cavernous internal carotids. Skull: Negative Sinuses/Orbits: Right anterior ethmoid sinus mucosal thickening adjacent to large confluent area of inspissated mucous concretions and/or osteomas. Slightly hypoplastic right mastoid without mastoid effusions. Other: None IMPRESSION: 1. Subtle loss of gray-white matter distinction involving the right anterior temporal lobe. Given its location, findings could be artifactual due to the concavity of the skull  base adjacent to this area. The possibility of a nonhemorrhagic infarct however is not entirely excluded. MRI may help resolve this. No hemorrhage or midline shift. 2. Stigmata of chronic ethmoid sinusitis. Electronically Signed   By: Ashley Royalty M.D.   On: 01/03/2018 00:58   Mr Brain Wo Contrast  Result Date: 01/03/2018 CLINICAL DATA:  Confusion and diplopia EXAM: MRI HEAD WITHOUT CONTRAST TECHNIQUE: Multiplanar, multiecho pulse sequences of the brain and surrounding structures were obtained without intravenous contrast. COMPARISON:  Head CT 01/03/2017 FINDINGS: Brain: The  midline structures are normal. There is no acute infarct or acute hemorrhage. No mass lesion, hydrocephalus, dural abnormality or extra-axial collection. There is mild multifocal leukoaraiosis, nonspecific and seen in normal patients of this age. No age-advanced or lobar predominant atrophy. No chronic microhemorrhage or superficial siderosis. Vascular: Major intracranial arterial and venous sinus flow voids are preserved. Skull and upper cervical spine: The visualized skull base, calvarium, upper cervical spine and extracranial soft tissues are normal. Sinuses/Orbits: No fluid levels or advanced mucosal thickening. No mastoid or middle ear effusion. Normal orbits. IMPRESSION: Normal aging brain.  No acute abnormality. Electronically Signed   By: Ulyses Jarred M.D.   On: 01/03/2018 04:24      Subjective: - no chest pain, shortness of breath, no abdominal pain, nausea or vomiting. Asking to go home   Discharge Exam: Vitals:   01/03/18 0930 01/03/18 1040  BP: (!) 146/93 (!) 148/94  Pulse: 74 76  Resp: 16 16  Temp:    SpO2: 95% 96%    General: Pt is alert, awake, not in acute distress Cardiovascular: RRR Respiratory: CTA bilaterally    The results of significant diagnostics from this hospitalization (including imaging, microbiology, ancillary and laboratory) are listed below for reference.     Microbiology: No results found for this or any previous visit (from the past 240 hour(s)).   Labs: BNP (last 3 results) Recent Labs    01/19/17 0551 01/03/18 0014  BNP 16.5 92.1   Basic Metabolic Panel: Recent Labs  Lab 01/02/18 2250  NA 133*  K 3.6  CL 93*  CO2 30  GLUCOSE 152*  BUN 13  CREATININE 0.86  CALCIUM 8.7*   Liver Function Tests: Recent Labs  Lab 01/02/18 2250  AST 22  ALT 17  ALKPHOS 87  BILITOT 0.6  PROT 7.1  ALBUMIN 4.2   No results for input(s): LIPASE, AMYLASE in the last 168 hours. Recent Labs  Lab 01/02/18 2300  AMMONIA 52*   CBC: Recent Labs   Lab 01/02/18 2250  WBC 7.9  NEUTROABS 5.8  HGB 16.0  HCT 46.5  MCV 89.6  PLT 168   Cardiac Enzymes: No results for input(s): CKTOTAL, CKMB, CKMBINDEX, TROPONINI in the last 168 hours. BNP: Invalid input(s): POCBNP CBG: Recent Labs  Lab 01/03/18 0029  GLUCAP 144*   D-Dimer No results for input(s): DDIMER in the last 72 hours. Hgb A1c Recent Labs    01/03/18 0436  HGBA1C 5.2   Lipid Profile Recent Labs    01/03/18 0436  CHOL 176  HDL 56  LDLCALC 99  TRIG 105  CHOLHDL 3.1   Thyroid function studies Recent Labs    01/03/18 0437  TSH 3.261   Anemia work up No results for input(s): VITAMINB12, FOLATE, FERRITIN, TIBC, IRON, RETICCTPCT in the last 72 hours. Urinalysis    Component Value Date/Time   COLORURINE STRAW (A) 01/03/2018 0010   APPEARANCEUR CLEAR 01/03/2018 0010   LABSPEC 1.009 01/03/2018 0010  PHURINE 5.0 01/03/2018 0010   GLUCOSEU NEGATIVE 01/03/2018 0010   GLUCOSEU NEGATIVE 04/15/2015 0945   HGBUR SMALL (A) 01/03/2018 0010   BILIRUBINUR NEGATIVE 01/03/2018 0010   BILIRUBINUR negative 09/18/2017 1403   KETONESUR NEGATIVE 01/03/2018 0010   PROTEINUR NEGATIVE 01/03/2018 0010   UROBILINOGEN 0.2 09/18/2017 1403   UROBILINOGEN 0.2 05/14/2015 1015   NITRITE NEGATIVE 01/03/2018 0010   LEUKOCYTESUR NEGATIVE 01/03/2018 0010   Sepsis Labs Invalid input(s): PROCALCITONIN,  WBC,  LACTICIDVEN   Time coordinating discharge: 25 minutes  SIGNED:  Marzetta Board, MD  [facility] 01/03/2018, 4:09 PM Pager [phone]  If 7PM-7AM, please contact night-coverage [email or web address] Password [email or web address]

## 2018-01-03 NOTE — ED Notes (Signed)
Spoke with Doctor regarding patient insistence of wanting to go home.  Doctor stated rounding currently and inform patient and family member at bedside.

## 2018-01-03 NOTE — ED Notes (Signed)
Patient ambulatory steady gait to bathroom and returned to stretcher without incident. Patient states he is ready to go home and want to talk with the Doctor. Paged admit doctor.

## 2018-01-12 ENCOUNTER — Encounter: Payer: Self-pay | Admitting: Family

## 2018-01-12 ENCOUNTER — Ambulatory Visit (INDEPENDENT_AMBULATORY_CARE_PROVIDER_SITE_OTHER): Payer: Medicare HMO | Admitting: Family

## 2018-01-12 VITALS — BP 142/80 | HR 83 | Temp 98.3°F | Resp 16 | Ht 66.0 in | Wt 220.0 lb

## 2018-01-12 DIAGNOSIS — B370 Candidal stomatitis: Secondary | ICD-10-CM

## 2018-01-12 DIAGNOSIS — R32 Unspecified urinary incontinence: Secondary | ICD-10-CM

## 2018-01-12 DIAGNOSIS — R4182 Altered mental status, unspecified: Secondary | ICD-10-CM

## 2018-01-12 DIAGNOSIS — R7989 Other specified abnormal findings of blood chemistry: Secondary | ICD-10-CM | POA: Diagnosis not present

## 2018-01-12 DIAGNOSIS — J45909 Unspecified asthma, uncomplicated: Secondary | ICD-10-CM | POA: Diagnosis not present

## 2018-01-12 LAB — URINALYSIS, ROUTINE W REFLEX MICROSCOPIC
Bilirubin Urine: NEGATIVE
HGB URINE DIPSTICK: NEGATIVE
Ketones, ur: NEGATIVE
Leukocytes, UA: NEGATIVE
NITRITE: NEGATIVE
RBC / HPF: NONE SEEN (ref 0–?)
Specific Gravity, Urine: <=1.005 — AB (ref 1.000–1.030)
TOTAL PROTEIN, URINE-UPE24: NEGATIVE
URINE GLUCOSE: NEGATIVE
UROBILINOGEN UA: 0.2 (ref 0.0–1.0)
pH: 6 (ref 5.0–8.0)

## 2018-01-12 LAB — PSA: PSA: 0.13 ng/mL (ref 0.10–4.00)

## 2018-01-12 LAB — AMMONIA: AMMONIA: 57 umol/L — AB (ref 11–35)

## 2018-01-12 MED ORDER — PREDNISONE 10 MG PO TABS: ORAL_TABLET | ORAL | 0 refills | Status: DC

## 2018-01-12 MED ORDER — NYSTATIN 100000 UNIT/ML MT SUSP: 5.0000 mL | Freq: Four times a day (QID) | OROMUCOSAL | 0 refills | Status: DC

## 2018-01-12 NOTE — Progress Notes (Signed)
Subjective:    Patient ID: Kyle Blair, male    DOB: 01-06-1961, 57 y.o.   MRN: [id number]  HPI  Patient is a 57 year old male who presents today for emergency room follow-up.  He was seen in the emergency room on January 02, 2018 with altered mental status.  He apparently also had some urinary incontinence but there was no witnessed seizure activity.  ED record is reviewed.  Apparently on the day of his ER visit he was awakened from his nap by his wife who reported that he was confused upon awakening.  He reported double vision which had started the night before his ER visit.  He has previous history of CVA.  Lab work revealed mild hyponatremia and mild hypercalcemia.  His ammonia level was mildly elevated.  It was recommended that he be admitted due to his altered mental status and his pulmonary edema which was noted at time of admission.  A CT of the head was performed which noted no acute changes.  MRI of the brain was requested by neurology. MRI was within normal limits. It was felt that his symptoms were likely related to multiple opioid medications. He is followed by pain and is currently prescribed Xtampza ER 18 mg every 12 hours and  Percocet 7.5 mg 1 tablet by mouth twice daily.  Reports that he has some urinary urgency. He continues flomax.   No results found for: PSA   Reports that his asthma has been flaring up recently.  Reports that he had a steroid injection in the emergency department.  This helped for a couple of days but he has recurrence of his wheezing.  He continues Advair and Singulair.  He is using albuterol twice daily.  Reports mouth soreness.  Reports recurrence of thrush. Review of Systems    see HPI  Past Medical History:  Diagnosis Date  . Arthritis    severe; pt takes strong pain meds daily/legs  . Asthma   . BPH (benign prostatic hypertrophy)   . GERD with stricture   . History of stroke 2004  . Hyperlipidemia   . Hypertension   . Hypothyroid   . Lung  nodule 02/03/2012  . Polycystic kidney disease   . Stroke [facility])      Social History   Socioeconomic History  . Marital status: Married    Spouse name: Not on file  . Number of children: 2  . Years of education: Not on file  . Highest education level: Not on file  Social Needs  . Financial resource strain: Not on file  . Food insecurity - worry: Not on file  . Food insecurity - inability: Not on file  . Transportation needs - medical: Not on file  . Transportation needs - non-medical: Not on file  Occupational History  . Occupation: DISABLED    Employer: UNEMPLOYED  Tobacco Use  . Smoking status: Current Every Day Smoker    Packs/day: 6.00    Years: 20.00    Pack years: 120.00    Types: Cigarettes  . Smokeless tobacco: Never Used  Substance and Sexual Activity  . Alcohol use: No    Alcohol/week: 0.0 oz  . Drug use: No  . Sexual activity: Not on file  Other Topics Concern  . Not on file  Social History Narrative   Caffeine use:  1 pot coffee daily, 1 glass tea   Regular exercise:  No. Has active lifestyle.   2 biological and 1 stepson.   Former smoker-  quit in 2007   He is on disability- reports that he has a history of heat stroke.               Past Surgical History:  Procedure Laterality Date  . CARPAL TUNNEL RELEASE    . COLONOSCOPY    . HERNIA REPAIR  2005   with mesh  . KNEE ARTHROSCOPY Bilateral 12/05/13   cyst and bone spur removed from left knee per pt.  Marland Kitchen KNEE ARTHROSCOPY Left 01/2015  . NASAL POLYP EXCISION  1992 or 93  . POLYPECTOMY    . TOTAL KNEE ARTHROPLASTY Right 05/19/2015   Procedure: RIGHT TOTAL KNEE ARTHROPLASTY;  Surgeon: Paralee Cancel, MD;  Location: [facility];  Service: Orthopedics;  Laterality: Right;    Family History  Problem Relation Age of Onset  . Heart disease Mother        pacemaker  . Diabetes Father   . Kidney disease Father   . Diabetes Sister   . Kidney disease Sister   . Colon cancer Neg Hx   . Esophageal cancer Neg Hx     . Rectal cancer Neg Hx   . Stomach cancer Neg Hx   . Prostate cancer Neg Hx   . Pancreatic cancer Neg Hx     Allergies  Allergen Reactions  . Aspirin Anaphylaxis  . Doxycycline Hyclate Swelling    Facial Swelling  . Ibuprofen Anaphylaxis  . Peanut Butter Flavor Anaphylaxis  . Cephalexin Rash    Current Outpatient Medications on File Prior to Visit  Medication Sig Dispense Refill  . acetaminophen (TYLENOL) 325 MG tablet Take 1-2 tablets (325-650 mg total) by mouth every 6 (six) hours as needed for mild pain or moderate pain. 30 tablet 0  . albuterol (PROVENTIL) (2.5 MG/3ML) 0.083% nebulizer solution Take 3 mLs (2.5 mg total) by nebulization every 6 (six) hours as needed for wheezing or shortness of breath. 75 mL 12  . albuterol (VENTOLIN HFA) 108 (90 Base) MCG/ACT inhaler Inhale 2 puffs into the lungs every 4 (four) hours as needed for wheezing or shortness of breath. 18 g 5  . amLODipine (NORVASC) 5 MG tablet TAKE ONE (1) TABLET BY MOUTH EVERY DAY 90 tablet 1  . benazepril (LOTENSIN) 10 MG tablet TAKE 1 TABLET(10 MG) BY MOUTH DAILY 30 tablet 5  . EVZIO 2 MG/0.4ML SOAJ Call 911. Inject 1 auto-injector intramuscularly in shoulder or thigh. Repeat every 3 minutes as needed if no or minimal response.  0  . Fluticasone-Salmeterol (ADVAIR DISKUS) 250-50 MCG/DOSE AEPB INHALE 1 PUFF INTO THE LUNGS TWICE DAILY 60 each 5  . furosemide (LASIX) 20 MG tablet Take 1 tablet (20 mg total) by mouth daily as needed. (Patient taking differently: Take 20 mg by mouth daily as needed for fluid. ) 30 tablet 5  . gabapentin (NEURONTIN) 300 MG capsule Take 300 mg by mouth 3 (three) times daily.   0  . JYNARQUE 45 & 15 MG TBPK     . levothyroxine (SYNTHROID, LEVOTHROID) 200 MCG tablet TAKE ONE (1) TABLET EACH DAY BEFORE BREAKFAST 90 tablet 1  . lovastatin (MEVACOR) 40 MG tablet TAKE ONE TABLET BY MOUTH EVERY NIGHT AT BEDTIME 90 tablet 2  . methocarbamol (ROBAXIN) 500 MG tablet Take 1 tablet (500 mg total) by  mouth every 6 (six) hours as needed for muscle spasms. (Patient taking differently: Take 500 mg by mouth 2 (two) times daily. ) 50 tablet 0  . montelukast (SINGULAIR) 10 MG tablet TAKE ONE TABLET EVERY  MORNING 90 tablet 1  . omeprazole (PRILOSEC) 40 MG capsule TAKE ONE (1) CAPSULE EACH DAY 90 capsule 1  . oxyCODONE-acetaminophen (PERCOCET) 7.5-325 MG tablet Take 1 tablet by mouth 2 (two) times daily.  0  . pantoprazole (PROTONIX) 40 MG tablet Take 1 tablet (40 mg total) by mouth daily. Take daily before breakfast on an empty stomach. 90 tablet 3  . tamsulosin (FLOMAX) 0.4 MG CAPS capsule Take 0.4 mg by mouth at bedtime.  0  . XTAMPZA ER 18 MG C12A Take 1 capsule by mouth 2 (two) times daily.  0   No current facility-administered medications on file prior to visit.     BP (!) 142/80 (BP Location: Left Arm, Patient Position: Sitting, Cuff Size: Large)   Pulse 83   Temp 98.3 F (36.8 C) (Oral)   Resp 16   Ht 5\' 6"  (1.676 m)   Wt 220 lb (99.8 kg)   SpO2 95%   BMI 35.51 kg/m    Objective:   Physical Exam  Constitutional: He is oriented to person, place, and time. He appears well-developed and well-nourished. No distress.  HENT:  Head: Normocephalic and atraumatic.  Mouth/Throat: No oropharyngeal exudate.  Appears red and glossy.  Cardiovascular: Normal rate and regular rhythm.  No murmur heard. Pulmonary/Chest: Effort normal. No respiratory distress. He has wheezes. He has no rales.  Musculoskeletal: He exhibits no edema.  Neurological: He is alert and oriented to person, place, and time.  Skin: Skin is warm and dry.  Psychiatric: He has a normal mood and affect. His behavior is normal. Thought content normal.          Assessment & Plan:  Altered mental status-resolved.  I wonder if he may have a seizure.  It was not witnessed.  He reports remote history of seizure approximately 10 years ago.  I have placed a referral for him to meet with neurology for further Evaluation.  His  mental status is at baseline.  Elevated ammonia level-noted with his last lab draw.  We will repeat ammonia level today.  This certainly could have contributed to his confusion.  Urinary incontinence-we will check urinalysis and culture to rule out infection.  Continue Flomax.  Check follow-up PSA.  Oral thrush-prescription was sent for nystatin suspension.  Asthma-deteriorated.  Will give short course of prednisone.  Patient is advised to continue Advair, Singulair, and as needed albuterol.

## 2018-01-12 NOTE — Patient Instructions (Addendum)
Please complete lab work prior to leaving. Begin prednisone for your asthma. Continue advair, singulair, albuterol as needed. Begin nystatin suspension for the thrush. Call if symptoms worsen or if symptoms fail to improve. You will be contacted about your referral to the neurologist. Please let your pain doctor know about your most recent episode of confusion. You should be contacted about your referral to the neurologist for further evaluation.

## 2018-01-13 LAB — URINE CULTURE
MICRO NUMBER:: 90078193
Result:: NO GROWTH
SPECIMEN QUALITY:: ADEQUATE

## 2018-01-15 ENCOUNTER — Telehealth: Payer: Self-pay | Admitting: Family

## 2018-01-15 DIAGNOSIS — K769 Liver disease, unspecified: Secondary | ICD-10-CM

## 2018-01-15 MED ORDER — LACTULOSE 20 GM/30ML PO SOLN: 30.0000 mL | Freq: Every day | ORAL | 3 refills | Status: DC

## 2018-01-15 NOTE — Telephone Encounter (Signed)
Notified pt and he voices understanding. Reports that he is already established with Dr Hilarie Fredrickson. Referral re-entered.

## 2018-01-15 NOTE — Telephone Encounter (Signed)
Ammonia level is elevated. I would like him to begin lactulose 30 ml once daily to keep this level down. Would also like for him to meet with GI, Dr. Silverio Decamp to discuss since the elevated ammonia level is liver related. f

## 2018-01-16 ENCOUNTER — Telehealth: Payer: Self-pay | Admitting: Family

## 2018-01-16 NOTE — Telephone Encounter (Signed)
-----   Message from Mosie Lukes, MD sent at 01/15/2018  8:37 AM EST ----- Used to send to Hepatology but Dr Silverio Decamp has been willing to take on these cases and very helpful so use her and yes would start with lactulose 30 ml po daily and recheck in a couple of weeks ----- Message ----- From: [name], NP Sent: 01/13/2018   9:13 AM To: Mosie Lukes, MD  Hello,  This patient has polycystic kidney disease.  Looks like he also has cysts in his liver (per MRI done some years back). Ammonia level is elevated.  He had recent admission for AMS.  Was fine when I saw him on Friday.  My question is should I send him to GI or Hepatology? Thinking hepatology. Also, would you add lactulose?  Thanks!  Kyle Blair

## 2018-01-25 ENCOUNTER — Other Ambulatory Visit: Payer: Self-pay | Admitting: Family

## 2018-01-25 DIAGNOSIS — G894 Chronic pain syndrome: Secondary | ICD-10-CM | POA: Diagnosis not present

## 2018-01-25 DIAGNOSIS — Z79891 Long term (current) use of opiate analgesic: Secondary | ICD-10-CM | POA: Diagnosis not present

## 2018-01-25 DIAGNOSIS — M179 Osteoarthritis of knee, unspecified: Secondary | ICD-10-CM | POA: Diagnosis not present

## 2018-01-25 DIAGNOSIS — E669 Obesity, unspecified: Secondary | ICD-10-CM | POA: Diagnosis not present

## 2018-01-25 DIAGNOSIS — Z79899 Other long term (current) drug therapy: Secondary | ICD-10-CM | POA: Diagnosis not present

## 2018-01-25 DIAGNOSIS — M545 Low back pain: Secondary | ICD-10-CM | POA: Diagnosis not present

## 2018-01-25 DIAGNOSIS — J45909 Unspecified asthma, uncomplicated: Secondary | ICD-10-CM

## 2018-01-30 DIAGNOSIS — M1712 Unilateral primary osteoarthritis, left knee: Secondary | ICD-10-CM | POA: Diagnosis not present

## 2018-01-30 DIAGNOSIS — Z96651 Presence of right artificial knee joint: Secondary | ICD-10-CM | POA: Diagnosis not present

## 2018-02-01 ENCOUNTER — Other Ambulatory Visit: Payer: Self-pay | Admitting: Family

## 2018-02-07 DIAGNOSIS — N181 Chronic kidney disease, stage 1: Secondary | ICD-10-CM | POA: Diagnosis not present

## 2018-02-07 DIAGNOSIS — Q612 Polycystic kidney, adult type: Secondary | ICD-10-CM | POA: Diagnosis not present

## 2018-02-07 DIAGNOSIS — I129 Hypertensive chronic kidney disease with stage 1 through stage 4 chronic kidney disease, or unspecified chronic kidney disease: Secondary | ICD-10-CM | POA: Diagnosis not present

## 2018-02-08 DIAGNOSIS — Z79891 Long term (current) use of opiate analgesic: Secondary | ICD-10-CM | POA: Diagnosis not present

## 2018-02-08 DIAGNOSIS — G894 Chronic pain syndrome: Secondary | ICD-10-CM | POA: Diagnosis not present

## 2018-02-08 DIAGNOSIS — Z79899 Other long term (current) drug therapy: Secondary | ICD-10-CM | POA: Diagnosis not present

## 2018-02-22 DIAGNOSIS — M545 Low back pain: Secondary | ICD-10-CM | POA: Diagnosis not present

## 2018-02-22 DIAGNOSIS — G894 Chronic pain syndrome: Secondary | ICD-10-CM | POA: Diagnosis not present

## 2018-02-22 DIAGNOSIS — Z79899 Other long term (current) drug therapy: Secondary | ICD-10-CM | POA: Diagnosis not present

## 2018-02-22 DIAGNOSIS — E669 Obesity, unspecified: Secondary | ICD-10-CM | POA: Diagnosis not present

## 2018-02-22 DIAGNOSIS — Z79891 Long term (current) use of opiate analgesic: Secondary | ICD-10-CM | POA: Diagnosis not present

## 2018-02-22 DIAGNOSIS — M179 Osteoarthritis of knee, unspecified: Secondary | ICD-10-CM | POA: Diagnosis not present

## 2018-02-27 ENCOUNTER — Other Ambulatory Visit: Payer: Self-pay | Admitting: Family

## 2018-03-01 ENCOUNTER — Encounter: Payer: Self-pay | Admitting: *Deleted

## 2018-03-14 DIAGNOSIS — N181 Chronic kidney disease, stage 1: Secondary | ICD-10-CM | POA: Diagnosis not present

## 2018-03-14 DIAGNOSIS — I129 Hypertensive chronic kidney disease with stage 1 through stage 4 chronic kidney disease, or unspecified chronic kidney disease: Secondary | ICD-10-CM | POA: Diagnosis not present

## 2018-03-14 DIAGNOSIS — Q612 Polycystic kidney, adult type: Secondary | ICD-10-CM | POA: Diagnosis not present

## 2018-03-20 ENCOUNTER — Other Ambulatory Visit (INDEPENDENT_AMBULATORY_CARE_PROVIDER_SITE_OTHER): Payer: Medicare HMO

## 2018-03-20 ENCOUNTER — Encounter: Payer: Self-pay | Admitting: Internal Medicine

## 2018-03-20 ENCOUNTER — Ambulatory Visit: Payer: Medicare HMO | Admitting: Internal Medicine

## 2018-03-20 VITALS — BP 134/88 | HR 82 | Ht 66.0 in | Wt 221.4 lb

## 2018-03-20 DIAGNOSIS — Q613 Polycystic kidney, unspecified: Secondary | ICD-10-CM

## 2018-03-20 DIAGNOSIS — E7220 Disorder of urea cycle metabolism, unspecified: Secondary | ICD-10-CM

## 2018-03-20 DIAGNOSIS — Z86010 Personal history of colonic polyps: Secondary | ICD-10-CM

## 2018-03-20 DIAGNOSIS — K219 Gastro-esophageal reflux disease without esophagitis: Secondary | ICD-10-CM

## 2018-03-20 LAB — COMPREHENSIVE METABOLIC PANEL
ALBUMIN: 4.5 g/dL (ref 3.5–5.2)
ALT: 15 U/L (ref 0–53)
AST: 13 U/L (ref 0–37)
Alkaline Phosphatase: 70 U/L (ref 39–117)
BILIRUBIN TOTAL: 0.5 mg/dL (ref 0.2–1.2)
BUN: 6 mg/dL (ref 6–23)
CALCIUM: 9.5 mg/dL (ref 8.4–10.5)
CO2: 28 mEq/L (ref 19–32)
CREATININE: 0.76 mg/dL (ref 0.40–1.50)
Chloride: 104 mEq/L (ref 96–112)
GFR: 112.67 mL/min (ref >60.00)
Glucose, Bld: 107 mg/dL — ABNORMAL HIGH (ref 70–99)
Potassium: 3.9 mEq/L (ref 3.5–5.1)
Sodium: 138 mEq/L (ref 135–145)
Total Protein: 7.2 g/dL (ref 6.0–8.3)

## 2018-03-20 MED ORDER — RANITIDINE HCL 150 MG PO CAPS: 150.0000 mg | ORAL_CAPSULE | Freq: Two times a day (BID) | ORAL | 4 refills | Status: DC

## 2018-03-20 NOTE — Patient Instructions (Signed)
Your physician has requested that you go to the basement for the following lab work before leaving today: CMP  We have sent the following medications to your pharmacy for you to pick up at your convenience: Ranitidine 150 mg twice daily  Continue pantoprazole 40 mg daily.  You have been scheduled for an MRI at [facility] on Saturday, 03/24/18. Your appointment time is 2:30 pm. Please arrive 15 minutes prior to your appointment time for registration purposes. Please make certain not to have anything to eat or drink 4 hours prior to your test. In addition, if you have any metal in your body, have a pacemaker or defibrillator, please be sure to let your ordering physician know. This test typically takes 45 minutes to 1 hour to complete. Should you need to reschedule, please call [phone] to do so.  CC: Dr Inda Castle,  Dr Arty Baumgartner  Normal BMI (Body Mass Index- based on height and weight) is between 19 and 25. Your BMI today is Body mass index is 35.73 kg/m. Marland Kitchen Please consider follow up  regarding your BMI with your Primary Care Provider.

## 2018-03-20 NOTE — Progress Notes (Signed)
Patient ID: Kyle Blair, male   DOB: [birth year], 57 y.o.   MRN: [id number] HPI: Hakeen Shipes is a 57 year old male with a history of adenomatous colon polyps, colonic diverticulosis, polycystic kidney disease, history of liver cysts, GERD with hiatal hernia, hypertension, hyperlipidemia and severe arthritis with chronic pain who is seen in consultation at the request of [name], NP to evaluate elevated ammonia.  He is here with his wife.  The patient was found to have an elevated ammonia after presenting to the ER in January 2019 with altered mental status.  This occurred on waking from sleep where his wife noted him to be confused and he had also been incontinent of urine.  There was no witnessed seizure activity.  He was found to have a mildly elevated ammonia at 58 but also hyponatremia and mild hypercalcemia.  MRI of the brain was within normal limits.  It was felt that his episode may have been secondary to narcotic pain medication.  We are asked to see him to consider possible liver disease.  He denies a history of prior liver disease but has been told in the past that he has liver cysts.  He denies jaundice.  Denies bleeding and easy bruising.  Denies increasing abdominal distention and lower extremity edema.  He denies episodes of confusion and sleepiness during the day.  He has had 1 or 2 episodes as described above where he has urinary incontinence in the middle of the night and wakes with confusion which clears quickly.  He denies abdominal pain.  He has been having breakthrough heartburn at night which wakes him from sleep.  This is despite pantoprazole 40 mg once daily.  He does not have dysphagia or odynophagia.  Appetite has been good.  He was prescribed lactulose by primary care but is not using this medication.  He sees Dr. Myna Hidalgo with nephrology for his polycystic kidney disease.  He had a renal ultrasound in September which we reviewed.  He does not drink alcohol  though in the past he did.  In the last several years he may have had one beer every few months.  No history of alcohol abuse.  He does have a family history of liver disease and his mother has cirrhosis he feels that this is cardiac related.  His father and sister both died from kidney disease and diabetes.  Past Medical History:  Diagnosis Date  . Arthritis    severe; pt takes strong pain meds daily/legs  . Asthma   . BPH (benign prostatic hypertrophy)   . Diverticulosis   . GERD with stricture   . Hiatal hernia   . History of stroke 2004  . Hyperlipidemia   . Hypertension   . Hypothyroid   . Lung nodule 02/03/2012  . Polycystic kidney disease   . Stroke ([place])   . Tubular adenoma of colon     Past Surgical History:  Procedure Laterality Date  . CARPAL TUNNEL RELEASE    . COLONOSCOPY    . HERNIA REPAIR  2005   with mesh  . KNEE ARTHROSCOPY Bilateral 12/05/13   cyst and bone spur removed from left knee per pt.  Marland Kitchen KNEE ARTHROSCOPY Left 01/2015  . NASAL POLYP EXCISION  1992 or 93  . POLYPECTOMY    . TOTAL KNEE ARTHROPLASTY Right 05/19/2015   Procedure: RIGHT TOTAL KNEE ARTHROPLASTY;  Surgeon: Paralee Cancel, MD;  Location: [facility];  Service: Orthopedics;  Laterality: Right;    Outpatient Medications Prior to Visit  Medication Sig Dispense Refill  . acetaminophen (TYLENOL) 325 MG tablet Take 1-2 tablets (325-650 mg total) by mouth every 6 (six) hours as needed for mild pain or moderate pain. 30 tablet 0  . albuterol (PROVENTIL) (2.5 MG/3ML) 0.083% nebulizer solution Take 3 mLs (2.5 mg total) by nebulization every 6 (six) hours as needed for wheezing or shortness of breath. 75 mL 12  . albuterol (VENTOLIN HFA) 108 (90 Base) MCG/ACT inhaler Inhale 2 puffs into the lungs every 4 (four) hours as needed for wheezing or shortness of breath. 18 g 5  . amLODipine (NORVASC) 5 MG tablet TAKE ONE (1) TABLET BY MOUTH EACH DAY 90 tablet 1  . benazepril (LOTENSIN) 10 MG tablet TAKE 1 TABLET(10  MG) BY MOUTH DAILY 30 tablet 5  . EVZIO 2 MG/0.4ML SOAJ Call 911. Inject 1 auto-injector intramuscularly in shoulder or thigh. Repeat every 3 minutes as needed if no or minimal response.  0  . Fluticasone-Salmeterol (ADVAIR DISKUS) 250-50 MCG/DOSE AEPB INHALE 1 PUFF INTO THE LUNGS TWICE DAILY 60 each 5  . furosemide (LASIX) 20 MG tablet Take 1 tablet (20 mg total) by mouth daily as needed. (Patient taking differently: Take 20 mg by mouth daily as needed for fluid. ) 30 tablet 5  . gabapentin (NEURONTIN) 300 MG capsule Take 300 mg by mouth 3 (three) times daily.   0  . JYNARQUE 45 & 15 MG TBPK     . levothyroxine (SYNTHROID, LEVOTHROID) 200 MCG tablet TAKE ONE (1) TABLET BY MOUTH EACH DAY BEFORE BREAKFAST 90 tablet 1  . lovastatin (MEVACOR) 40 MG tablet TAKE ONE (1) TABLET BY MOUTH EACH DAY ATBEDTIME 90 tablet 1  . methocarbamol (ROBAXIN) 500 MG tablet Take 1 tablet (500 mg total) by mouth every 6 (six) hours as needed for muscle spasms. (Patient taking differently: Take 500 mg by mouth 2 (two) times daily. ) 50 tablet 0  . montelukast (SINGULAIR) 10 MG tablet TAKE ONE TABLET EVERY MORNING 90 tablet 1  . nystatin (MYCOSTATIN) 100000 UNIT/ML suspension Take 5 mLs (500,000 Units total) by mouth 4 (four) times daily. 200 mL 0  . omeprazole (PRILOSEC) 40 MG capsule TAKE ONE (1) CAPSULE EACH DAY 90 capsule 1  . oxyCODONE-acetaminophen (PERCOCET) 7.5-325 MG tablet Take 1 tablet by mouth 2 (two) times daily.  0  . pantoprazole (PROTONIX) 40 MG tablet Take 1 tablet (40 mg total) by mouth daily. Take daily before breakfast on an empty stomach. 90 tablet 3  . tamsulosin (FLOMAX) 0.4 MG CAPS capsule Take 0.4 mg by mouth at bedtime.  0  . tamsulosin (FLOMAX) 0.4 MG CAPS capsule TAKE ONE (1) CAPSULE EACH DAY BY MOUTH 90 capsule 1  . furosemide (LASIX) 20 MG tablet TAKE ONE TABLET TWICE DAILY 180 tablet 0  . Lactulose 20 GM/30ML SOLN Take 30 mLs (20 g total) by mouth daily. 473 mL 3  . predniSONE (DELTASONE) 10  MG tablet 4 tabs by mouth once daily for 2 days, then 3 tabs daily x 2 days, then 2 tabs daily x 2 days, then 1 tab daily x 2 days 20 tablet 0  . XTAMPZA ER 18 MG C12A Take 1 capsule by mouth 2 (two) times daily.  0   No facility-administered medications prior to visit.     Allergies  Allergen Reactions  . Aspirin Anaphylaxis  . Doxycycline Hyclate Swelling    Facial Swelling  . Ibuprofen Anaphylaxis  . Peanut Butter Flavor Anaphylaxis  . Cephalexin Rash    Family  History  Problem Relation Age of Onset  . Heart disease Mother        pacemaker  . Diabetes Father   . Kidney disease Father   . Diabetes Sister   . Kidney disease Sister   . Colon cancer Neg Hx   . Esophageal cancer Neg Hx   . Rectal cancer Neg Hx   . Stomach cancer Neg Hx   . Prostate cancer Neg Hx   . Pancreatic cancer Neg Hx     Social History   Tobacco Use  . Smoking status: Current Every Day Smoker    Packs/day: 6.00    Years: 20.00    Pack years: 120.00    Types: Cigarettes  . Smokeless tobacco: Never Used  Substance Use Topics  . Alcohol use: No    Alcohol/week: 0.0 oz  . Drug use: No    ROS: As per history of present illness, otherwise negative  BP 134/88   Pulse 82   Ht 5\' 6"  (1.676 m)   Wt 221 lb 6 oz (100.4 kg)   BMI 35.73 kg/m  Constitutional: Well-developed and well-nourished. No distress. HEENT: Normocephalic and atraumatic. Marland Kitchen Conjunctivae are normal.  No scleral icterus. Neck: Neck supple. Trachea midline. Cardiovascular: Normal rate, regular rhythm and intact distal pulses. No M/R/G Pulmonary/chest: Effort normal and breath sounds normal. No wheezing, rales or rhonchi. Abdominal: Soft, obese, nontender, nondistended. Bowel sounds active throughout. There are no masses palpable. No hepatosplenomegaly. Extremities: no clubbing, cyanosis, or edema Neurological: Alert and oriented to person place and time.  No asterixis Skin: Skin is warm and dry.  Psychiatric: Normal mood and  affect. Behavior is normal.  RELEVANT LABS AND IMAGING: CBC    Component Value Date/Time   WBC 7.9 01/02/2018 2250   RBC 5.19 01/02/2018 2250   HGB 16.0 01/02/2018 2250   HCT 46.5 01/02/2018 2250   PLT 168 01/02/2018 2250   MCV 89.6 01/02/2018 2250   MCH 30.8 01/02/2018 2250   MCHC 34.4 01/02/2018 2250   RDW 13.8 01/02/2018 2250   LYMPHSABS 1.4 01/02/2018 2250   MONOABS 0.7 01/02/2018 2250   EOSABS 0.0 01/02/2018 2250   BASOSABS 0.0 01/02/2018 2250    CMP     Component Value Date/Time   NA 138 03/20/2018 1605   K 3.9 03/20/2018 1605   CL 104 03/20/2018 1605   CO2 28 03/20/2018 1605   GLUCOSE 107 (H) 03/20/2018 1605   BUN 6 03/20/2018 1605   CREATININE 0.76 03/20/2018 1605   CREATININE 0.93 08/19/2014 1114   CALCIUM 9.5 03/20/2018 1605   PROT 7.2 03/20/2018 1605   ALBUMIN 4.5 03/20/2018 1605   AST 13 03/20/2018 1605   ALT 15 03/20/2018 1605   ALKPHOS 70 03/20/2018 1605   BILITOT 0.5 03/20/2018 1605   GFRNONAA >60 01/02/2018 2250   GFRNONAA >89 08/19/2014 1114   GFRAA >60 01/02/2018 2250   GFRAA >89 08/19/2014 1114    Ammonia checked twice in January; 57 and 52  MRI abd/pelvis 2012  MRI ABDOMEN   Findings:  Multiple T1 hypointense, T2 hyperintense lesions are seen scattered throughout the liver parenchyma.  These are too numerous to count.  The largest lesion is in the posteromedial right liver and measures 3.4 x 4.0 cm.  From there, the sizes range down to only one 2 mm.  Most of these lesions have simple features. Some of the cystic lesions have a thin internal septations.  No lesions with mural nodularity or irregularity are evident.  No lesions with adjacent parenchymal edema are identified.   The spleen is unremarkable.  The stomach, duodenum, pancreas, adrenal glands, bowel loops have normal imaging features.  The gallbladder is decompressed.   Multiple cystic lesions are seen throughout both kidneys as well. The dominant cyst in the right kidney  measures 2.7 cm. A 14 mm cyst in the medial upper pole of the right kidney has slightly ill- defined margins and may have some internal architecture.  This is difficult to further evaluate without intravenous contrast material.   The dominant cyst in the left kidney measures 3.1 cm and has layering dependent debris or milk of calcium. This has increased slightly in size from 2.7 cm on 01/06/2010 exam.  No worrisome or suspicious lesions are seen in the left kidney on this study.   No abdominal aortic aneurysm.  No abdominal lymphadenopathy.  No intraperitoneal free fluid.   IMPRESSION: Multiple cystic lesions throughout the liver measure up to 4 cm in size.  While some of these have following smooth internal septations, no lesions are overtly suspicious.  Assessment for enhancement is not possible without intravenous contrast material.   Multiple bilateral renal lesions measuring up to 2.7 cm on the right and 3.1 cm on the left.  Most of these has simple features but there is a 14 mm cystic lesion in the medial right kidney which has slightly ill-defined margins and a question of internal architecture. This lesion was seen on the CT scan from 01/06/2010 (performed with intravenous contrast) and showed well defined margins with no evidence for enhancement at that time.  The lesion has been stable in the 58-month interval since that prior study (measuring 15 mm on the previous CT).  The lesion was also perceptible on the CT scan without contrast performed on 09/06/2009, but was not well seen at that time as IV contrast was not administered.   MRI PELVIS   Findings: There is no evidence for lymphadenopathy in the pelvis. No free fluid in the pelvis.  The bladder is normal.  Visualized bowel loops are unremarkable.  Marrow signal within the visualized skeleton is normal.   IMPRESSION: Unremarkable MRI evaluation of the pelvis.   Original Report Authenticated By: ERIC A.  MANSELL, M.D.  ASSESSMENT/PLAN:  57 year old male with a history of adenomatous colon polyps, colonic diverticulosis, polycystic kidney disease, history of liver cysts, GERD with hiatal hernia, hypertension, hyperlipidemia and severe arthritis with chronic pain who is seen in consultation at the request of [name], NP to evaluate elevated ammonia.  1.  Hyperammonemia --my suspicion for cirrhosis/portal hypertension is low.  I do not think his mild elevation in serum ammonia relates to liver disease.  His liver enzymes are normal, his albumin is normal.  His platelets are normal.  His risk factors for elevated ammonia include renal disease and narcotic pain medication.  I am going to recheck liver enzymes today.  I  have also recommended MRI abdomen with and without contrast to evaluate liver parenchyma as well as follow-up his polycystic kidney disease.  He has a history of benign liver cysts and we discussed how liver cyst can be associated with polycystic kidney disease but rarely lead to liver dysfunction.  He is not having right upper quadrant pain or any attributable liver symptoms.  2.  GERD --breakthrough reflux at night.  Continue pantoprazole 40 mg daily and add ranitidine 150 mg nightly  3.  History of adenomatous colon polyps --he is on a surveillance colonoscopy program for  his history of colon polyps.     [place], [address], [place], [place] [address]

## 2018-03-22 DIAGNOSIS — Z79891 Long term (current) use of opiate analgesic: Secondary | ICD-10-CM | POA: Diagnosis not present

## 2018-03-22 DIAGNOSIS — G894 Chronic pain syndrome: Secondary | ICD-10-CM | POA: Diagnosis not present

## 2018-03-22 DIAGNOSIS — M179 Osteoarthritis of knee, unspecified: Secondary | ICD-10-CM | POA: Diagnosis not present

## 2018-03-22 DIAGNOSIS — Z79899 Other long term (current) drug therapy: Secondary | ICD-10-CM | POA: Diagnosis not present

## 2018-03-24 ENCOUNTER — Ambulatory Visit (HOSPITAL_BASED_OUTPATIENT_CLINIC_OR_DEPARTMENT_OTHER)
Admission: RE | Admit: 2018-03-24 | Discharge: 2018-03-24 | Disposition: A | Discharge status: Home / Self Care | Payer: Medicare HMO | Source: Ambulatory Visit | Attending: Internal Medicine | Admitting: Internal Medicine

## 2018-03-24 DIAGNOSIS — K7689 Other specified diseases of liver: Secondary | ICD-10-CM | POA: Diagnosis not present

## 2018-03-24 DIAGNOSIS — K219 Gastro-esophageal reflux disease without esophagitis: Secondary | ICD-10-CM | POA: Diagnosis not present

## 2018-03-24 DIAGNOSIS — Q446 Cystic disease of liver: Secondary | ICD-10-CM | POA: Diagnosis not present

## 2018-03-24 DIAGNOSIS — Q613 Polycystic kidney, unspecified: Secondary | ICD-10-CM | POA: Insufficient documentation

## 2018-03-24 DIAGNOSIS — N289 Disorder of kidney and ureter, unspecified: Secondary | ICD-10-CM | POA: Diagnosis not present

## 2018-03-24 DIAGNOSIS — E7220 Disorder of urea cycle metabolism, unspecified: Secondary | ICD-10-CM | POA: Diagnosis present

## 2018-03-24 MED ORDER — GADOBENATE DIMEGLUMINE 529 MG/ML IV SOLN
20.0000 mL | Freq: Once | INTRAVENOUS | Status: AC | PRN
  Administered 2018-03-24: 20 mL via INTRAVENOUS

## 2018-03-30 DIAGNOSIS — Z8673 Personal history of transient ischemic attack (TIA), and cerebral infarction without residual deficits: Secondary | ICD-10-CM | POA: Diagnosis not present

## 2018-03-30 DIAGNOSIS — R609 Edema, unspecified: Secondary | ICD-10-CM | POA: Diagnosis not present

## 2018-03-30 DIAGNOSIS — J449 Chronic obstructive pulmonary disease, unspecified: Secondary | ICD-10-CM | POA: Diagnosis not present

## 2018-03-30 DIAGNOSIS — Z720 Tobacco use: Secondary | ICD-10-CM | POA: Diagnosis not present

## 2018-03-30 DIAGNOSIS — E785 Hyperlipidemia, unspecified: Secondary | ICD-10-CM | POA: Diagnosis not present

## 2018-04-05 DIAGNOSIS — M25569 Pain in unspecified knee: Secondary | ICD-10-CM | POA: Diagnosis not present

## 2018-04-05 DIAGNOSIS — G894 Chronic pain syndrome: Secondary | ICD-10-CM | POA: Diagnosis not present

## 2018-04-05 DIAGNOSIS — Z79891 Long term (current) use of opiate analgesic: Secondary | ICD-10-CM | POA: Diagnosis not present

## 2018-04-05 DIAGNOSIS — Z79899 Other long term (current) drug therapy: Secondary | ICD-10-CM | POA: Diagnosis not present

## 2018-04-05 DIAGNOSIS — M179 Osteoarthritis of knee, unspecified: Secondary | ICD-10-CM | POA: Diagnosis not present

## 2018-04-16 ENCOUNTER — Telehealth: Payer: Self-pay | Admitting: Emergency Medicine

## 2018-04-16 ENCOUNTER — Telehealth: Payer: Self-pay

## 2018-04-16 ENCOUNTER — Ambulatory Visit (INDEPENDENT_AMBULATORY_CARE_PROVIDER_SITE_OTHER): Payer: Medicare HMO | Admitting: Family

## 2018-04-16 VITALS — BP 142/93 | HR 111 | Temp 98.2°F | Resp 18 | Ht 66.0 in | Wt 214.8 lb

## 2018-04-16 DIAGNOSIS — F329 Major depressive disorder, single episode, unspecified: Secondary | ICD-10-CM | POA: Diagnosis not present

## 2018-04-16 DIAGNOSIS — Z720 Tobacco use: Secondary | ICD-10-CM | POA: Diagnosis not present

## 2018-04-16 DIAGNOSIS — B351 Tinea unguium: Secondary | ICD-10-CM

## 2018-04-16 DIAGNOSIS — F32A Depression, unspecified: Secondary | ICD-10-CM

## 2018-04-16 DIAGNOSIS — R32 Unspecified urinary incontinence: Secondary | ICD-10-CM

## 2018-04-16 DIAGNOSIS — R634 Abnormal weight loss: Secondary | ICD-10-CM | POA: Diagnosis not present

## 2018-04-16 DIAGNOSIS — L600 Ingrowing nail: Secondary | ICD-10-CM | POA: Diagnosis not present

## 2018-04-16 DIAGNOSIS — R69 Illness, unspecified: Secondary | ICD-10-CM | POA: Diagnosis not present

## 2018-04-16 LAB — CBC WITH DIFFERENTIAL/PLATELET
BASOS PCT: 0.3 % (ref 0.0–3.0)
Basophils Absolute: 0 10*3/uL (ref 0.0–0.1)
EOS PCT: 0.1 % (ref 0.0–5.0)
Eosinophils Absolute: 0 10*3/uL (ref 0.0–0.7)
HCT: 53 % — ABNORMAL HIGH (ref 39.0–52.0)
LYMPHS ABS: 1.6 10*3/uL (ref 0.7–4.0)
Lymphocytes Relative: 18.1 % (ref 12.0–46.0)
MCHC: 34.6 g/dL (ref 30.0–36.0)
MCV: 89.2 fl (ref 78.0–100.0)
MONO ABS: 0.7 10*3/uL (ref 0.1–1.0)
MONOS PCT: 8.1 % (ref 3.0–12.0)
NEUTROS PCT: 73.4 % (ref 43.0–77.0)
Neutro Abs: 6.6 10*3/uL (ref 1.4–7.7)
Platelets: 208 10*3/uL (ref 150.0–400.0)
RBC: 5.94 Mil/uL — AB (ref 4.22–5.81)
RDW: 13.2 % (ref 11.5–15.5)
WBC: 9 10*3/uL (ref 4.0–10.5)

## 2018-04-16 LAB — COMPREHENSIVE METABOLIC PANEL
ALK PHOS: 71 U/L (ref 39–117)
ALT: 13 U/L (ref 0–53)
AST: 12 U/L (ref 0–37)
Albumin: 4.6 g/dL (ref 3.5–5.2)
BUN: 7 mg/dL (ref 6–23)
CHLORIDE: 105 meq/L (ref 96–112)
CO2: 23 mEq/L (ref 19–32)
Calcium: 9.7 mg/dL (ref 8.4–10.5)
Creatinine, Ser: 0.76 mg/dL (ref 0.40–1.50)
GFR: 112.64 mL/min (ref >60.00)
GLUCOSE: 93 mg/dL (ref 70–99)
POTASSIUM: 3.5 meq/L (ref 3.5–5.1)
SODIUM: 138 meq/L (ref 135–145)
TOTAL PROTEIN: 7.1 g/dL (ref 6.0–8.3)
Total Bilirubin: 0.6 mg/dL (ref 0.2–1.2)

## 2018-04-16 LAB — TSH: TSH: 1.27 u[IU]/mL (ref 0.35–4.50)

## 2018-04-16 MED ORDER — VARENICLINE TARTRATE 0.5 MG X 11 & 1 MG X 42 PO MISC: ORAL | 0 refills | Status: DC

## 2018-04-16 NOTE — Patient Instructions (Addendum)
Please call [facility] to schedule an appointment with Rodney Cruise for [phone] Complete lab work prior to leaving.  Try to eat 3 well balanced meals a day.  Call me in 3 weeks to let me know how you are doing on the chantix and how your mood is.  If stable we will send refills for 2nd and 3rd months.

## 2018-04-16 NOTE — Telephone Encounter (Signed)
"  CRITICAL VALUE STICKER  CRITICAL VALUE:HGb 18.5  RECEIVER (on-site recipient of call):Chandlar Guice P.  DATE & TIME NOTIFIED: 04-16-18 1641  MESSENGER (representative from lab):Margarita Grizzle  MD NOTIFIED: Edwena Blow  TIME OF NOTIFICATION:1643  RESPONSE: Make a phone not she will take care of Critical.

## 2018-04-16 NOTE — Telephone Encounter (Signed)
PA initiated via Covermymeds; KEY: [id number]. Awaiting determination.

## 2018-04-16 NOTE — Progress Notes (Signed)
Subjective:    Patient ID: Kyle Blair, male    DOB: [birth year], 57 y.o.   MRN: [id number]  HPI  Patient is a 57 year old male who presents today with several concerns.  Toenail pain.  Curls and causes pain.   Asthma- reports that his asthma has been stable.   Reports that he recently had left knee drained and had cortisone injection. Saw Pain management.   Depression- reports poor appetite. Goes 2-3 days without eating.  Generally only eating once a day.  He reports being very frustrated with his current marital situation.  He states that his wife suffers from depression and has not been caring for herself.  He feels very isolated in the relationship.  He feels that the majority of his depression is stemming from this stressor.  He denies active suicidal ideation.  He reports that he has had transient thoughts of suicide with the last episode 1 week ago.  He reports that he does not have a plan.  He reports that he knows that his son needs him.  Wt Readings from Last 3 Encounters:  04/16/18 214 lb 12.8 oz (97.4 kg)  03/20/18 221 lb 6 oz (100.4 kg)  01/12/18 220 lb (99.8 kg)   Reports that he had anaphylaxis to the "experimental kidney medication."  He reports that he is now off of the medication and his kidney doctor has been notified.  Reports rare urinary incontinence- typically happens when he lays down.  Wakes up "wet."  Reports that he "just lays in my room."     Review of Systems See HPI  Past Medical History:  Diagnosis Date  . Arthritis    severe; pt takes strong pain meds daily/legs  . Asthma   . BPH (benign prostatic hypertrophy)   . Diverticulosis   . GERD with stricture   . Hiatal hernia   . History of stroke 2004  . Hyperlipidemia   . Hypertension   . Hypothyroid   . Lung nodule 02/03/2012  . Polycystic kidney disease   . Stroke ([place])   . Tubular adenoma of colon      Social History   Socioeconomic History  . Marital status: Married    Spouse  name: Not on file  . Number of children: 2  . Years of education: Not on file  . Highest education level: Not on file  Occupational History  . Occupation: DISABLED    Employer: UNEMPLOYED  Social Needs  . Financial resource strain: Not on file  . Food insecurity:    Worry: Not on file    Inability: Not on file  . Transportation needs:    Medical: Not on file    Non-medical: Not on file  Tobacco Use  . Smoking status: Current Every Day Smoker    Packs/day: 6.00    Years: 20.00    Pack years: 120.00    Types: Cigarettes  . Smokeless tobacco: Never Used  Substance and Sexual Activity  . Alcohol use: No    Alcohol/week: 0.0 oz  . Drug use: No  . Sexual activity: Not on file  Lifestyle  . Physical activity:    Days per week: Not on file    Minutes per session: Not on file  . Stress: Not on file  Relationships  . Social connections:    Talks on phone: Not on file    Gets together: Not on file    Attends religious service: Not on file    Active member of  club or organization: Not on file    Attends meetings of clubs or organizations: Not on file    Relationship status: Not on file  . Intimate partner violence:    Fear of current or ex partner: Not on file    Emotionally abused: Not on file    Physically abused: Not on file    Forced sexual activity: Not on file  Other Topics Concern  . Not on file  Social History Narrative   Caffeine use:  1 pot coffee daily, 1 glass tea   Regular exercise:  No. Has active lifestyle.   2 biological and 1 stepson.   Former smoker- quit in 2007   He is on disability- reports that he has a history of heat stroke.               Past Surgical History:  Procedure Laterality Date  . CARPAL TUNNEL RELEASE    . COLONOSCOPY    . HERNIA REPAIR  2005   with mesh  . KNEE ARTHROSCOPY Bilateral 12/05/13   cyst and bone spur removed from left knee per pt.  Marland Kitchen KNEE ARTHROSCOPY Left 01/2015  . NASAL POLYP EXCISION  1992 or 93  . POLYPECTOMY     . TOTAL KNEE ARTHROPLASTY Right 05/19/2015   Procedure: RIGHT TOTAL KNEE ARTHROPLASTY;  Surgeon: Paralee Cancel, MD;  Location: [facility];  Service: Orthopedics;  Laterality: Right;    Family History  Problem Relation Age of Onset  . Heart disease Mother        pacemaker  . Diabetes Father   . Kidney disease Father   . Diabetes Sister   . Kidney disease Sister   . Colon cancer Neg Hx   . Esophageal cancer Neg Hx   . Rectal cancer Neg Hx   . Stomach cancer Neg Hx   . Prostate cancer Neg Hx   . Pancreatic cancer Neg Hx     Allergies  Allergen Reactions  . Aspirin Anaphylaxis  . Doxycycline Hyclate Swelling    Facial Swelling  . Ibuprofen Anaphylaxis  . Jynarque [Tolvaptan] Anaphylaxis  . Peanut Butter Flavor Anaphylaxis  . Cephalexin Rash    Current Outpatient Medications on File Prior to Visit  Medication Sig Dispense Refill  . acetaminophen (TYLENOL) 325 MG tablet Take 1-2 tablets (325-650 mg total) by mouth every 6 (six) hours as needed for mild pain or moderate pain. 30 tablet 0  . albuterol (PROVENTIL) (2.5 MG/3ML) 0.083% nebulizer solution Take 3 mLs (2.5 mg total) by nebulization every 6 (six) hours as needed for wheezing or shortness of breath. 75 mL 12  . albuterol (VENTOLIN HFA) 108 (90 Base) MCG/ACT inhaler Inhale 2 puffs into the lungs every 4 (four) hours as needed for wheezing or shortness of breath. 18 g 5  . amLODipine (NORVASC) 5 MG tablet TAKE ONE (1) TABLET BY MOUTH EACH DAY 90 tablet 1  . benazepril (LOTENSIN) 10 MG tablet TAKE 1 TABLET(10 MG) BY MOUTH DAILY 30 tablet 5  . EVZIO 2 MG/0.4ML SOAJ Call 911. Inject 1 auto-injector intramuscularly in shoulder or thigh. Repeat every 3 minutes as needed if no or minimal response.  0  . Fluticasone-Salmeterol (ADVAIR DISKUS) 250-50 MCG/DOSE AEPB INHALE 1 PUFF INTO THE LUNGS TWICE DAILY 60 each 5  . furosemide (LASIX) 20 MG tablet Take 1 tablet (20 mg total) by mouth daily as needed. (Patient taking differently: Take 20  mg by mouth daily as needed for fluid. ) 30 tablet 5  .  gabapentin (NEURONTIN) 300 MG capsule Take 300 mg by mouth 3 (three) times daily.   0  . JYNARQUE 45 & 15 MG TBPK     . levothyroxine (SYNTHROID, LEVOTHROID) 200 MCG tablet TAKE ONE (1) TABLET BY MOUTH EACH DAY BEFORE BREAKFAST 90 tablet 1  . lovastatin (MEVACOR) 40 MG tablet TAKE ONE (1) TABLET BY MOUTH EACH DAY ATBEDTIME 90 tablet 1  . methocarbamol (ROBAXIN) 500 MG tablet Take 1 tablet (500 mg total) by mouth every 6 (six) hours as needed for muscle spasms. (Patient taking differently: Take 500 mg by mouth 2 (two) times daily. ) 50 tablet 0  . montelukast (SINGULAIR) 10 MG tablet TAKE ONE TABLET EVERY MORNING 90 tablet 1  . omeprazole (PRILOSEC) 40 MG capsule TAKE ONE (1) CAPSULE EACH DAY 90 capsule 1  . oxyCODONE-acetaminophen (PERCOCET) 7.5-325 MG tablet Take 1 tablet by mouth 2 (two) times daily.  0  . pantoprazole (PROTONIX) 40 MG tablet Take 1 tablet (40 mg total) by mouth daily. Take daily before breakfast on an empty stomach. 90 tablet 3  . ranitidine (ZANTAC) 150 MG capsule Take 1 capsule (150 mg total) by mouth 2 (two) times daily. 60 capsule 4  . tamsulosin (FLOMAX) 0.4 MG CAPS capsule Take 0.4 mg by mouth at bedtime.  0   No current facility-administered medications on file prior to visit.     Pulse (!) 111   Temp 98.2 F (36.8 C) (Oral)   Resp 18   Ht '5\' 6"'  (1.676 m)   Wt 214 lb 12.8 oz (97.4 kg)   SpO2 96%   BMI 34.67 kg/m       Objective:   Physical Exam  Constitutional: He is oriented to person, place, and time. He appears well-developed and well-nourished. No distress.  HENT:  Head: Normocephalic and atraumatic.  Cardiovascular: Normal rate and regular rhythm.  No murmur heard. Pulmonary/Chest: Effort normal and breath sounds normal. No respiratory distress. He has no wheezes. He has no rales.  Musculoskeletal: He exhibits no edema.  Neurological: He is alert and oriented to person, place, and time.    Skin: Skin is warm and dry.  Right grea left great toenail t toenail is very curved and ingrown without any erythema or swelling.  Is thickened.  Psychiatric: He has a normal mood and affect. His behavior is normal. Thought content normal.          Assessment & Plan:  Depression-patient scored 11 on PHQ 9 today.  We discussed addition of a medication for his depression.  He declines at this time stating he is afraid that it may interfere with his ability to continue his concealed carry permit.  He is open minded to counseling.  I have given him information to call.  I did recommend psychiatric referral but he declines at this time.  He is advised to call 911 go directly to the ER should he have active thoughts of suicide with plan.  Patient verbalizes understanding.  Tobacco abuse-he reports that he has quit for several weeks on Chantix.  He did not return for the refills and therefore ran out and started smoking again.  I have advised patient to start the starting month pack and give [place] a call in a few weeks to let us know how he is doing.  If he is stable then we can send additional refills.  He denies any worsening of his depression on Chantix previously.  Weight loss-I think this is primarily due to his  depression.  Will obtain C met, CBC and TSH to further evaluate.  Plan follow-up in 1 month.  Enuresis-concerning for possibility of obstructive sleep apnea will order home sleep study.  Ingrown toenail/onychomycosis-refer to podiatry for further evaluation.

## 2018-04-16 NOTE — Telephone Encounter (Signed)
PA approved. Effective 12/24/2017 through 10/16/2018. Referral number: [id number].

## 2018-04-17 ENCOUNTER — Other Ambulatory Visit: Payer: Self-pay | Admitting: Family

## 2018-04-17 ENCOUNTER — Other Ambulatory Visit (INDEPENDENT_AMBULATORY_CARE_PROVIDER_SITE_OTHER): Payer: Medicare HMO

## 2018-04-17 ENCOUNTER — Telehealth: Payer: Self-pay | Admitting: Family

## 2018-04-17 DIAGNOSIS — D582 Other hemoglobinopathies: Secondary | ICD-10-CM

## 2018-04-17 LAB — FERRITIN: Ferritin: 64.8 ng/mL (ref 22.0–322.0)

## 2018-04-17 NOTE — Telephone Encounter (Signed)
Please ask lab to add on ferritin level. Dx is elevated hemoglobin.

## 2018-04-17 NOTE — Telephone Encounter (Signed)
Add on request faxed to the lab.

## 2018-04-17 NOTE — Telephone Encounter (Signed)
Notified pt and scheduled lab appt for 04/24/18 at 10:30am. Notified him of 1 month f/u with PCP on 05/14/18 at 2:20pm.

## 2018-04-17 NOTE — Telephone Encounter (Signed)
Please let patient know that his hemoglobin was elevated.  He may be somewhat dehydrated.  I would recommend that he really push fluids and to that he return to the lab in 1 week for follow-up CBC as well as some additional testing.

## 2018-04-19 DIAGNOSIS — M179 Osteoarthritis of knee, unspecified: Secondary | ICD-10-CM | POA: Diagnosis not present

## 2018-04-19 DIAGNOSIS — G894 Chronic pain syndrome: Secondary | ICD-10-CM | POA: Diagnosis not present

## 2018-04-19 DIAGNOSIS — M545 Low back pain: Secondary | ICD-10-CM | POA: Diagnosis not present

## 2018-04-24 ENCOUNTER — Other Ambulatory Visit: Payer: Medicare HMO

## 2018-05-07 ENCOUNTER — Encounter: Payer: Self-pay | Admitting: Podiatry

## 2018-05-07 ENCOUNTER — Ambulatory Visit: Payer: Medicare HMO | Admitting: Podiatry

## 2018-05-07 DIAGNOSIS — L600 Ingrowing nail: Secondary | ICD-10-CM

## 2018-05-07 DIAGNOSIS — B351 Tinea unguium: Secondary | ICD-10-CM

## 2018-05-07 NOTE — Progress Notes (Signed)
Subjective:    Patient ID: Kyle Blair, male    DOB: 03/01/1961, 57 y.o.   MRN: [id number]  HPI 57 year old male presents the office with concerns of painful ingrown sounds the right big toe.  He states this is been ongoing and has been getting worse.  He states that in general his nails are thick and discolored but is growing and on the right side causing a lot of discomfort he had to have his nail corner removed.  Denies any redness or drainage or any swelling.   Review of Systems  All other systems reviewed and are negative.  Past Medical History:  Diagnosis Date  . Arthritis    severe; pt takes strong pain meds daily/legs  . Asthma   . BPH (benign prostatic hypertrophy)   . Diverticulosis   . GERD with stricture   . Hiatal hernia   . History of stroke 2004  . Hyperlipidemia   . Hypertension   . Hypothyroid   . Lung nodule 02/03/2012  . Polycystic kidney disease   . Stroke ([place])   . Tubular adenoma of colon     Past Surgical History:  Procedure Laterality Date  . CARPAL TUNNEL RELEASE    . COLONOSCOPY    . HERNIA REPAIR  2005   with mesh  . KNEE ARTHROSCOPY Bilateral 12/05/13   cyst and bone spur removed from left knee per pt.  Marland Kitchen KNEE ARTHROSCOPY Left 01/2015  . NASAL POLYP EXCISION  1992 or 93  . POLYPECTOMY    . TOTAL KNEE ARTHROPLASTY Right 05/19/2015   Procedure: RIGHT TOTAL KNEE ARTHROPLASTY;  Surgeon: Paralee Cancel, MD;  Location: [facility];  Service: Orthopedics;  Laterality: Right;     Current Outpatient Medications:  .  acetaminophen (TYLENOL) 325 MG tablet, Take 1-2 tablets (325-650 mg total) by mouth every 6 (six) hours as needed for mild pain or moderate pain., Disp: 30 tablet, Rfl: 0 .  albuterol (PROVENTIL) (2.5 MG/3ML) 0.083% nebulizer solution, Take 3 mLs (2.5 mg total) by nebulization every 6 (six) hours as needed for wheezing or shortness of breath., Disp: 75 mL, Rfl: 12 .  albuterol (VENTOLIN HFA) 108 (90 Base) MCG/ACT inhaler, Inhale 2 puffs into  the lungs every 4 (four) hours as needed for wheezing or shortness of breath., Disp: 18 g, Rfl: 5 .  amLODipine (NORVASC) 5 MG tablet, TAKE ONE (1) TABLET BY MOUTH EACH DAY, Disp: 90 tablet, Rfl: 1 .  benazepril (LOTENSIN) 10 MG tablet, TAKE 1 TABLET(10 MG) BY MOUTH DAILY, Disp: 30 tablet, Rfl: 5 .  EVZIO 2 MG/0.4ML SOAJ, Call 911. Inject 1 auto-injector intramuscularly in shoulder or thigh. Repeat every 3 minutes as needed if no or minimal response., Disp: , Rfl: 0 .  Fluticasone-Salmeterol (ADVAIR DISKUS) 250-50 MCG/DOSE AEPB, INHALE 1 PUFF INTO THE LUNGS TWICE DAILY, Disp: 60 each, Rfl: 5 .  furosemide (LASIX) 20 MG tablet, Take 1 tablet (20 mg total) by mouth daily as needed. (Patient taking differently: Take 20 mg by mouth daily as needed for fluid. ), Disp: 30 tablet, Rfl: 5 .  gabapentin (NEURONTIN) 300 MG capsule, Take 300 mg by mouth 3 (three) times daily. , Disp: , Rfl: 0 .  levothyroxine (SYNTHROID, LEVOTHROID) 200 MCG tablet, TAKE ONE (1) TABLET BY MOUTH EACH DAY BEFORE BREAKFAST, Disp: 90 tablet, Rfl: 1 .  lovastatin (MEVACOR) 40 MG tablet, TAKE ONE (1) TABLET BY MOUTH EACH DAY ATBEDTIME, Disp: 90 tablet, Rfl: 1 .  methocarbamol (ROBAXIN) 500 MG tablet, Take  1 tablet (500 mg total) by mouth every 6 (six) hours as needed for muscle spasms. (Patient taking differently: Take 500 mg by mouth 2 (two) times daily. ), Disp: 50 tablet, Rfl: 0 .  montelukast (SINGULAIR) 10 MG tablet, TAKE ONE TABLET EVERY MORNING, Disp: 90 tablet, Rfl: 1 .  omeprazole (PRILOSEC) 40 MG capsule, TAKE ONE (1) CAPSULE EACH DAY, Disp: 90 capsule, Rfl: 1 .  oxyCODONE-acetaminophen (PERCOCET) 7.5-325 MG tablet, Take 1 tablet by mouth 2 (two) times daily., Disp: , Rfl: 0 .  pantoprazole (PROTONIX) 40 MG tablet, Take 1 tablet (40 mg total) by mouth daily. Take daily before breakfast on an empty stomach., Disp: 90 tablet, Rfl: 3 .  ranitidine (ZANTAC) 150 MG capsule, Take 1 capsule (150 mg total) by mouth 2 (two) times  daily., Disp: 60 capsule, Rfl: 4 .  tamsulosin (FLOMAX) 0.4 MG CAPS capsule, Take 0.4 mg by mouth at bedtime., Disp: , Rfl: 0 .  varenicline (CHANTIX STARTING MONTH PAK) 0.5 MG X 11 & 1 MG X 42 tablet, one 0.5 mg tab by mouth 1 x  daily for 3 days, then increase to one 0.5 mg tablet 2 x daily for 4 days, then increase to one 1 mg tab bid, Disp: 53 tablet, Rfl: 0  Allergies  Allergen Reactions  . Aspirin Anaphylaxis  . Doxycycline Hyclate Swelling    Facial Swelling  . Ibuprofen Anaphylaxis  . Jynarque [Tolvaptan] Anaphylaxis  . Peanut Butter Flavor Anaphylaxis  . Cephalexin Rash    Social History   Socioeconomic History  . Marital status: Married    Spouse name: Not on file  . Number of children: 2  . Years of education: Not on file  . Highest education level: Not on file  Occupational History  . Occupation: DISABLED    Employer: UNEMPLOYED  Social Needs  . Financial resource strain: Not on file  . Food insecurity:    Worry: Not on file    Inability: Not on file  . Transportation needs:    Medical: Not on file    Non-medical: Not on file  Tobacco Use  . Smoking status: Current Every Day Smoker    Packs/day: 6.00    Years: 20.00    Pack years: 120.00    Types: Cigarettes  . Smokeless tobacco: Never Used  Substance and Sexual Activity  . Alcohol use: No    Alcohol/week: 0.0 oz  . Drug use: No  . Sexual activity: Not on file  Lifestyle  . Physical activity:    Days per week: Not on file    Minutes per session: Not on file  . Stress: Not on file  Relationships  . Social connections:    Talks on phone: Not on file    Gets together: Not on file    Attends religious service: Not on file    Active member of club or organization: Not on file    Attends meetings of clubs or organizations: Not on file    Relationship status: Not on file  . Intimate partner violence:    Fear of current or ex partner: Not on file    Emotionally abused: Not on file    Physically  abused: Not on file    Forced sexual activity: Not on file  Other Topics Concern  . Not on file  Social History Narrative   Caffeine use:  1 pot coffee daily, 1 glass tea   Regular exercise:  No. Has active lifestyle.   2 biological  and 1 stepson.   Former smoker- quit in 2007   He is on disability- reports that he has a history of heat stroke.                  Objective:   Physical Exam General: AAO x3, NAD  Dermatological: Nails are hypertrophic, dystrophic with yellow-brown discoloration.  There is incurvation along the medial aspect of the right hallux toenail with tenderness palpation.  There is localized edema to the area but there is no significant erythema there is no ascending cellulitis.  There is no fluctuation or crepitation or drainage or pus.   Vascular: Dorsalis Pedis artery and Posterior Tibial artery pedal pulses are 2/4 bilateral with immedate capillary fill time. There is no pain with calf compression, swelling, warmth, erythema.   Neruologic: Grossly intact via light touch bilateral. Protective threshold with Semmes Wienstein monofilament intact to all pedal sites bilateral.   Musculoskeletal: No gross boney pedal deformities bilateral. No pain, crepitus, or limitation noted with foot and ankle range of motion bilateral. Muscular strength 5/5 in all groups tested bilateral.  Gait: Unassisted, Nonantalgic.     Assessment & Plan:  57 year old male with symptomatic right medial hallux ingrown toenail; onychomcyosis -Treatment options discussed including all alternatives, risks, and complications -Etiology of symptoms were discussed At this time, the patient is requesting partial nail removal with chemical matricectomy to the symptomatic portion of the nail.  We also discussed alternatives to this.  Risks and complications were discussed with the patient for which they understand and written consent was obtained. Under sterile conditions a total of 3 mL of a mixture  of 2% lidocaine plain and 0.5% Marcaine plain was infiltrated in a hallux block fashion. Once anesthetized, the skin was prepped in sterile fashion. A tourniquet was then applied. Next the medial aspect of hallux nail border was then sharply excised making sure to remove the entire offending nail border. Once the nails were ensured to be removed area was debrided and the underlying skin was intact. There is no purulence identified in the procedure. Next phenol was then applied under standard conditions and copiously irrigated. Silvadene was applied. A dry sterile dressing was applied. After application of the dressing the tourniquet was removed and there is found to be an immediate capillary refill time to the digit. The patient tolerated the procedure well any complications. Post procedure instructions were discussed the patient for which he verbally understood. Follow-up in one week for nail check or sooner if any problems are to arise. Discussed signs/symptoms of infection and directed to call the office immediately should any occur or go directly to the emergency room. In the meantime, encouraged to call the office with any questions, concerns, changes symptoms. -As a courtesy I did debride the left hallux toenails without any complications.  We discussed treatment options for nail fungus but he states he is not worried about this.  Trula Slade DPM

## 2018-05-07 NOTE — Patient Instructions (Signed)

## 2018-05-08 DIAGNOSIS — B351 Tinea unguium: Secondary | ICD-10-CM | POA: Insufficient documentation

## 2018-05-08 DIAGNOSIS — L600 Ingrowing nail: Secondary | ICD-10-CM | POA: Insufficient documentation

## 2018-05-14 ENCOUNTER — Encounter: Payer: Self-pay | Admitting: Family

## 2018-05-14 ENCOUNTER — Encounter: Payer: Self-pay | Admitting: Podiatry

## 2018-05-14 ENCOUNTER — Ambulatory Visit (INDEPENDENT_AMBULATORY_CARE_PROVIDER_SITE_OTHER): Payer: Medicare HMO | Admitting: Family

## 2018-05-14 ENCOUNTER — Ambulatory Visit (INDEPENDENT_AMBULATORY_CARE_PROVIDER_SITE_OTHER): Payer: Medicare HMO | Admitting: Podiatry

## 2018-05-14 VITALS — BP 129/80 | HR 92 | Temp 98.0°F | Resp 18 | Ht 66.0 in | Wt 213.8 lb

## 2018-05-14 DIAGNOSIS — D582 Other hemoglobinopathies: Secondary | ICD-10-CM

## 2018-05-14 DIAGNOSIS — L600 Ingrowing nail: Secondary | ICD-10-CM

## 2018-05-14 DIAGNOSIS — R634 Abnormal weight loss: Secondary | ICD-10-CM

## 2018-05-14 DIAGNOSIS — M25562 Pain in left knee: Secondary | ICD-10-CM | POA: Diagnosis not present

## 2018-05-14 DIAGNOSIS — Z720 Tobacco use: Secondary | ICD-10-CM

## 2018-05-14 DIAGNOSIS — Z9889 Other specified postprocedural states: Secondary | ICD-10-CM

## 2018-05-14 MED ORDER — VARENICLINE TARTRATE 1 MG PO TABS: 1.0000 mg | ORAL_TABLET | Freq: Two times a day (BID) | ORAL | 1 refills | Status: DC

## 2018-05-14 NOTE — Patient Instructions (Signed)

## 2018-05-14 NOTE — Progress Notes (Signed)
Subjective:    Patient ID: Kyle Blair, male    DOB: [birth year], 57 y.o.   MRN: [id number]  HPI  Kyle Blair is a 57 yr old male who presents today for follow up.  1) Weight loss- He denies abdominal pain.   Lab Results  Component Value Date   TSH 1.27 04/16/2018    Wt Readings from Last 3 Encounters:  05/14/18 213 lb 12.8 oz (97 kg)  04/16/18 214 lb 12.8 oz (97.4 kg)  03/20/18 221 lb 6 oz (100.4 kg)   2) Leg Pain- reports pain left kn  3) Tobacco abuse-  Down to 5 cigarettes a day. Was smoking a full PPD.  Denies depression symptoms.     Review of Systems See HPI  Past Medical History:  Diagnosis Date  . Arthritis    severe; pt takes strong pain meds daily/legs  . Asthma   . BPH (benign prostatic hypertrophy)   . Diverticulosis   . GERD with stricture   . Hiatal hernia   . History of stroke 2004  . Hyperlipidemia   . Hypertension   . Hypothyroid   . Lung nodule 02/03/2012  . Polycystic kidney disease   . Stroke ([place])   . Tubular adenoma of colon      Social History   Socioeconomic History  . Marital status: Married    Spouse name: Not on file  . Number of children: 2  . Years of education: Not on file  . Highest education level: Not on file  Occupational History  . Occupation: DISABLED    Employer: UNEMPLOYED  Social Needs  . Financial resource strain: Not on file  . Food insecurity:    Worry: Not on file    Inability: Not on file  . Transportation needs:    Medical: Not on file    Non-medical: Not on file  Tobacco Use  . Smoking status: Current Every Day Smoker    Packs/day: 6.00    Years: 20.00    Pack years: 120.00    Types: Cigarettes  . Smokeless tobacco: Never Used  . Tobacco comment: 5-6 cigarettes a day  Substance and Sexual Activity  . Alcohol use: No    Alcohol/week: 0.0 oz  . Drug use: No  . Sexual activity: Not on file  Lifestyle  . Physical activity:    Days per week: Not on file    Minutes per session: Not on file    . Stress: Not on file  Relationships  . Social connections:    Talks on phone: Not on file    Gets together: Not on file    Attends religious service: Not on file    Active member of club or organization: Not on file    Attends meetings of clubs or organizations: Not on file    Relationship status: Not on file  . Intimate partner violence:    Fear of current or ex partner: Not on file    Emotionally abused: Not on file    Physically abused: Not on file    Forced sexual activity: Not on file  Other Topics Concern  . Not on file  Social History Narrative   Caffeine use:  1 pot coffee daily, 1 glass tea   Regular exercise:  No. Has active lifestyle.   2 biological and 1 stepson.   Former smoker- quit in 2007   He is on disability- reports that he has a history of heat stroke.  Past Surgical History:  Procedure Laterality Date  . CARPAL TUNNEL RELEASE    . COLONOSCOPY    . HERNIA REPAIR  2005   with mesh  . KNEE ARTHROSCOPY Bilateral 12/05/13   cyst and bone spur removed from left knee per pt.  Marland Kitchen KNEE ARTHROSCOPY Left 01/2015  . NASAL POLYP EXCISION  1992 or 93  . POLYPECTOMY    . TOTAL KNEE ARTHROPLASTY Right 05/19/2015   Procedure: RIGHT TOTAL KNEE ARTHROPLASTY;  Surgeon: Paralee Cancel, MD;  Location: [facility];  Service: Orthopedics;  Laterality: Right;    Family History  Problem Relation Age of Onset  . Heart disease Mother        pacemaker  . Diabetes Father   . Kidney disease Father   . Diabetes Sister   . Kidney disease Sister   . Colon cancer Neg Hx   . Esophageal cancer Neg Hx   . Rectal cancer Neg Hx   . Stomach cancer Neg Hx   . Prostate cancer Neg Hx   . Pancreatic cancer Neg Hx     Allergies  Allergen Reactions  . Aspirin Anaphylaxis  . Doxycycline Hyclate Swelling    Facial Swelling  . Ibuprofen Anaphylaxis  . Jynarque [Tolvaptan] Anaphylaxis  . Peanut Butter Flavor Anaphylaxis  . Cephalexin Rash    Current Outpatient Medications  on File Prior to Visit  Medication Sig Dispense Refill  . acetaminophen (TYLENOL) 325 MG tablet Take 1-2 tablets (325-650 mg total) by mouth every 6 (six) hours as needed for mild pain or moderate pain. 30 tablet 0  . albuterol (PROVENTIL) (2.5 MG/3ML) 0.083% nebulizer solution Take 3 mLs (2.5 mg total) by nebulization every 6 (six) hours as needed for wheezing or shortness of breath. 75 mL 12  . albuterol (VENTOLIN HFA) 108 (90 Base) MCG/ACT inhaler Inhale 2 puffs into the lungs every 4 (four) hours as needed for wheezing or shortness of breath. 18 g 5  . amLODipine (NORVASC) 5 MG tablet TAKE ONE (1) TABLET BY MOUTH EACH DAY 90 tablet 1  . benazepril (LOTENSIN) 10 MG tablet TAKE 1 TABLET(10 MG) BY MOUTH DAILY 30 tablet 5  . EVZIO 2 MG/0.4ML SOAJ Call 911. Inject 1 auto-injector intramuscularly in shoulder or thigh. Repeat every 3 minutes as needed if no or minimal response.  0  . Fluticasone-Salmeterol (ADVAIR DISKUS) 250-50 MCG/DOSE AEPB INHALE 1 PUFF INTO THE LUNGS TWICE DAILY 60 each 5  . furosemide (LASIX) 20 MG tablet Take 1 tablet (20 mg total) by mouth daily as needed. (Patient taking differently: Take 20 mg by mouth daily as needed for fluid. ) 30 tablet 5  . gabapentin (NEURONTIN) 300 MG capsule Take 300 mg by mouth 3 (three) times daily.   0  . levothyroxine (SYNTHROID, LEVOTHROID) 200 MCG tablet TAKE ONE (1) TABLET BY MOUTH EACH DAY BEFORE BREAKFAST 90 tablet 1  . lovastatin (MEVACOR) 40 MG tablet TAKE ONE (1) TABLET BY MOUTH EACH DAY ATBEDTIME 90 tablet 1  . methocarbamol (ROBAXIN) 500 MG tablet Take 1 tablet (500 mg total) by mouth every 6 (six) hours as needed for muscle spasms. (Patient taking differently: Take 500 mg by mouth 2 (two) times daily. ) 50 tablet 0  . montelukast (SINGULAIR) 10 MG tablet TAKE ONE TABLET EVERY MORNING 90 tablet 1  . omeprazole (PRILOSEC) 40 MG capsule TAKE ONE (1) CAPSULE EACH DAY 90 capsule 1  . oxyCODONE-acetaminophen (PERCOCET) 7.5-325 MG tablet Take 1  tablet by mouth 2 (two) times daily.  0  . pantoprazole (PROTONIX) 40 MG tablet Take 1 tablet (40 mg total) by mouth daily. Take daily before breakfast on an empty stomach. 90 tablet 3  . ranitidine (ZANTAC) 150 MG capsule Take 1 capsule (150 mg total) by mouth 2 (two) times daily. 60 capsule 4  . tamsulosin (FLOMAX) 0.4 MG CAPS capsule Take 0.4 mg by mouth at bedtime.  0   No current facility-administered medications on file prior to visit.     BP 129/80 (BP Location: Right Arm, Cuff Size: Large)   Pulse 92   Temp 98 F (36.7 C) (Oral)   Resp 18   Ht 5\' 6"  (1.676 m)   Wt 213 lb 12.8 oz (97 kg)   SpO2 94%   BMI 34.51 kg/m      Objective:   Physical Exam  Constitutional: He is oriented to person, place, and time. He appears well-developed and well-nourished. No distress.  HENT:  Head: Normocephalic and atraumatic.  Cardiovascular: Normal rate and regular rhythm.  No murmur heard. Pulmonary/Chest: Effort normal and breath sounds normal. No respiratory distress. He has no wheezes. He has no rales.  Abdominal: Soft. Bowel sounds are normal. He exhibits no distension. There is no tenderness. There is no guarding.  Musculoskeletal: He exhibits no edema.  Neurological: He is alert and oriented to person, place, and time.  Skin: Skin is warm and dry.  Psychiatric: He has a normal mood and affect. His behavior is normal. Thought content normal.          Assessment & Plan:  Weight loss- he has lost 1 pound.  I think stress is playing a role. His wife is chronically ill and having issues with depression/chronic pain and he is primary care giver. Will continue to monitor. He is due for follow up labs today which I have asked him to complete.   Left knee pain- following injury picking up his wife.  Will refer to sports medicine for further evaluation.   Tobacco abuse- improving, refills sent for chantix.

## 2018-05-14 NOTE — Patient Instructions (Signed)
Please complete lab work prior to leaving. conitnue chatix.

## 2018-05-15 ENCOUNTER — Encounter: Payer: Self-pay | Admitting: Family

## 2018-05-15 LAB — CBC WITH DIFFERENTIAL/PLATELET
BASOS ABS: 0 10*3/uL (ref 0.0–0.1)
Basophils Relative: 0.4 % (ref 0.0–3.0)
EOS ABS: 0 10*3/uL (ref 0.0–0.7)
Eosinophils Relative: 0.1 % (ref 0.0–5.0)
HEMATOCRIT: 48 % (ref 39.0–52.0)
Hemoglobin: 16.6 g/dL (ref 13.0–17.0)
LYMPHS PCT: 18 % (ref 12.0–46.0)
Lymphs Abs: 1.2 10*3/uL (ref 0.7–4.0)
MCHC: 34.5 g/dL (ref 30.0–36.0)
MCV: 89.1 fl (ref 78.0–100.0)
Monocytes Absolute: 0.6 10*3/uL (ref 0.1–1.0)
Monocytes Relative: 8.9 % (ref 3.0–12.0)
NEUTROS ABS: 5 10*3/uL (ref 1.4–7.7)
Neutrophils Relative %: 72.6 % (ref 43.0–77.0)
PLATELETS: 193 10*3/uL (ref 150.0–400.0)
RBC: 5.39 Mil/uL (ref 4.22–5.81)
RDW: 13.4 % (ref 11.5–15.5)
WBC: 6.8 10*3/uL (ref 4.0–10.5)

## 2018-05-15 NOTE — Progress Notes (Signed)
Subjective: Kyle Blair is a 57 y.o.  male returns to office today for follow up evaluation after having right hallux medial partial nail avulsion performed.  He has been soaking Epsom salts last couple days.  He has not been putting antibiotic ointment or Band-Aid on the area.  He states the pain is getting much better.  Denies any drainage or pus coming from the area.  Denies any redness or red streaks.  Patient denies fevers, chills, nausea, vomiting. Denies any calf pain, chest pain, SOB.   Objective:  Vitals: Reviewed  General: Well developed, nourished, in no acute distress, alert and oriented x3   Dermatology: Skin is warm, dry and supple bilateral. RIGHT hallux nail border appears to be clean, dry, with mild granular tissue and surrounding scab. There is no surrounding erythema, edema, drainage/purulence. The remaining nails appear unremarkable at this time. There are no other lesions or other signs of infection present.  Neurovascular status: Intact. No lower extremity swelling; No pain with calf compression bilateral.  Musculoskeletal: Minimal tenderness to palpation of the medial hallux nail fold. Muscular strength within normal limits bilateral.   Assesement and Plan: S/p partial nail avulsion, doing well.   -Continue soaking in epsom salts twice a day followed by antibiotic ointment and a band-aid. Can leave uncovered at night. Continue this until completely healed.  -If the area has not healed in 2 weeks, call the office for follow-up appointment, or sooner if any problems arise.  -Monitor for any signs/symptoms of infection. Call the office immediately if any occur or go directly to the emergency room. Call with any questions/concerns.  Celesta Gentile, DPM

## 2018-05-16 DIAGNOSIS — G4733 Obstructive sleep apnea (adult) (pediatric): Secondary | ICD-10-CM | POA: Diagnosis not present

## 2018-05-17 DIAGNOSIS — M545 Low back pain: Secondary | ICD-10-CM | POA: Diagnosis not present

## 2018-05-17 DIAGNOSIS — M179 Osteoarthritis of knee, unspecified: Secondary | ICD-10-CM | POA: Diagnosis not present

## 2018-05-17 DIAGNOSIS — G4733 Obstructive sleep apnea (adult) (pediatric): Secondary | ICD-10-CM | POA: Diagnosis not present

## 2018-05-17 DIAGNOSIS — Z79899 Other long term (current) drug therapy: Secondary | ICD-10-CM | POA: Diagnosis not present

## 2018-05-17 DIAGNOSIS — Z79891 Long term (current) use of opiate analgesic: Secondary | ICD-10-CM | POA: Diagnosis not present

## 2018-05-17 DIAGNOSIS — E669 Obesity, unspecified: Secondary | ICD-10-CM | POA: Diagnosis not present

## 2018-05-17 DIAGNOSIS — G894 Chronic pain syndrome: Secondary | ICD-10-CM | POA: Diagnosis not present

## 2018-05-18 ENCOUNTER — Other Ambulatory Visit: Payer: Self-pay | Admitting: Family

## 2018-05-18 DIAGNOSIS — R32 Unspecified urinary incontinence: Secondary | ICD-10-CM

## 2018-05-18 LAB — HEMOCHROMATOSIS DNA-PCR(C282Y,H63D)

## 2018-05-18 NOTE — Progress Notes (Signed)
HST order replaced to release for results scanning Parke Poisson, [facility] 05/18/18

## 2018-05-21 ENCOUNTER — Telehealth: Payer: Self-pay | Admitting: Family

## 2018-05-21 DIAGNOSIS — Z148 Genetic carrier of other disease: Secondary | ICD-10-CM | POA: Insufficient documentation

## 2018-05-21 NOTE — Telephone Encounter (Signed)
Follow up blood count is normal.  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  This can cause higher blood counts and iron levels. He should avoid iron supplements.

## 2018-05-22 NOTE — Telephone Encounter (Signed)
Notified pt and he voices understanding. 

## 2018-06-02 ENCOUNTER — Encounter: Payer: Self-pay | Admitting: Family

## 2018-06-02 DIAGNOSIS — G4733 Obstructive sleep apnea (adult) (pediatric): Secondary | ICD-10-CM | POA: Insufficient documentation

## 2018-06-04 ENCOUNTER — Telehealth: Payer: Self-pay

## 2018-06-04 NOTE — Telephone Encounter (Signed)
Author phoned pt. to review sleep study results per Lenna Sciara, NP, recommending weight loss. Pt. stated "that's not on my top priority list". Nevertheless, pt. Appreciative.

## 2018-06-04 NOTE — Telephone Encounter (Signed)
-----   Message from [name], NP sent at 06/02/2018  9:00 PM EDT ----- Home sleep study shows mild OSA.  Recommend weight loss.

## 2018-06-12 DIAGNOSIS — M545 Low back pain: Secondary | ICD-10-CM | POA: Diagnosis not present

## 2018-06-12 DIAGNOSIS — G894 Chronic pain syndrome: Secondary | ICD-10-CM | POA: Diagnosis not present

## 2018-06-12 DIAGNOSIS — Z79899 Other long term (current) drug therapy: Secondary | ICD-10-CM | POA: Diagnosis not present

## 2018-06-12 DIAGNOSIS — Z79891 Long term (current) use of opiate analgesic: Secondary | ICD-10-CM | POA: Diagnosis not present

## 2018-06-12 DIAGNOSIS — M179 Osteoarthritis of knee, unspecified: Secondary | ICD-10-CM | POA: Diagnosis not present

## 2018-06-14 ENCOUNTER — Other Ambulatory Visit: Payer: Self-pay | Admitting: Family

## 2018-06-20 ENCOUNTER — Ambulatory Visit (INDEPENDENT_AMBULATORY_CARE_PROVIDER_SITE_OTHER): Payer: Medicare HMO | Admitting: Family

## 2018-06-20 ENCOUNTER — Encounter: Payer: Self-pay | Admitting: Family

## 2018-06-20 VITALS — BP 125/84 | HR 75 | Temp 98.0°F | Resp 16

## 2018-06-20 DIAGNOSIS — J45901 Unspecified asthma with (acute) exacerbation: Secondary | ICD-10-CM | POA: Diagnosis not present

## 2018-06-20 DIAGNOSIS — R69 Illness, unspecified: Secondary | ICD-10-CM | POA: Diagnosis not present

## 2018-06-20 MED ORDER — PREDNISONE 10 MG PO TABS: ORAL_TABLET | ORAL | 0 refills | Status: DC

## 2018-06-20 MED ORDER — ALBUTEROL SULFATE (2.5 MG/3ML) 0.083% IN NEBU: 2.5000 mg | INHALATION_SOLUTION | Freq: Four times a day (QID) | RESPIRATORY_TRACT | 12 refills | Status: DC | PRN

## 2018-06-20 NOTE — Patient Instructions (Signed)
Please begin prednisone taper for your asthma. You may use albuterol every 4-6 hours as needed. Call if symptoms worsen, if you develop fever or if you are not improved in 3-4 days.

## 2018-06-20 NOTE — Progress Notes (Signed)
Subjective:    Patient ID: Kyle Blair, male    DOB: 1961/01/03, 57 y.o.   MRN: [id number]  HPI  Patient is a 57 year old male with history of asthma who presents today as a work in following his wife's appointment. He reports that for the last 24 hours he has been having worsening asthma symptoms.  He reports that he worked hard in the yard yesterday pressure washing and doing yard work.  Since that time his chest is felt wheezy and his breathing has felt tight.   Review of Systems See HPI  Past Medical History:  Diagnosis Date  . Arthritis    severe; pt takes strong pain meds daily/legs  . Asthma   . BPH (benign prostatic hypertrophy)   . Diverticulosis   . GERD with stricture   . Hiatal hernia   . History of stroke 2004  . Hyperlipidemia   . Hypertension   . Hypothyroid   . Lung nodule 02/03/2012  . Polycystic kidney disease   . Stroke ([place])   . Tubular adenoma of colon      Social History   Socioeconomic History  . Marital status: Married    Spouse name: Not on file  . Number of children: 2  . Years of education: Not on file  . Highest education level: Not on file  Occupational History  . Occupation: DISABLED    Employer: UNEMPLOYED  Social Needs  . Financial resource strain: Not on file  . Food insecurity:    Worry: Not on file    Inability: Not on file  . Transportation needs:    Medical: Not on file    Non-medical: Not on file  Tobacco Use  . Smoking status: Current Every Day Smoker    Packs/day: 6.00    Years: 20.00    Pack years: 120.00    Types: Cigarettes  . Smokeless tobacco: Never Used  . Tobacco comment: 5-6 cigarettes a day  Substance and Sexual Activity  . Alcohol use: No    Alcohol/week: 0.0 oz  . Drug use: No  . Sexual activity: Not on file  Lifestyle  . Physical activity:    Days per week: Not on file    Minutes per session: Not on file  . Stress: Not on file  Relationships  . Social connections:    Talks on phone: Not on  file    Gets together: Not on file    Attends religious service: Not on file    Active member of club or organization: Not on file    Attends meetings of clubs or organizations: Not on file    Relationship status: Not on file  . Intimate partner violence:    Fear of current or ex partner: Not on file    Emotionally abused: Not on file    Physically abused: Not on file    Forced sexual activity: Not on file  Other Topics Concern  . Not on file  Social History Narrative   Caffeine use:  1 pot coffee daily, 1 glass tea   Regular exercise:  No. Has active lifestyle.   2 biological and 1 stepson.   Former smoker- quit in 2007   He is on disability- reports that he has a history of heat stroke.               Past Surgical History:  Procedure Laterality Date  . CARPAL TUNNEL RELEASE    . COLONOSCOPY    . HERNIA REPAIR  2005   with mesh  . KNEE ARTHROSCOPY Bilateral 12/05/13   cyst and bone spur removed from left knee per pt.  Marland Kitchen KNEE ARTHROSCOPY Left 01/2015  . NASAL POLYP EXCISION  1992 or 93  . POLYPECTOMY    . TOTAL KNEE ARTHROPLASTY Right 05/19/2015   Procedure: RIGHT TOTAL KNEE ARTHROPLASTY;  Surgeon: Paralee Cancel, MD;  Location: [facility];  Service: Orthopedics;  Laterality: Right;    Family History  Problem Relation Age of Onset  . Heart disease Mother        pacemaker  . Diabetes Father   . Kidney disease Father   . Diabetes Sister   . Kidney disease Sister   . Colon cancer Neg Hx   . Esophageal cancer Neg Hx   . Rectal cancer Neg Hx   . Stomach cancer Neg Hx   . Prostate cancer Neg Hx   . Pancreatic cancer Neg Hx     Allergies  Allergen Reactions  . Aspirin Anaphylaxis  . Doxycycline Hyclate Swelling    Facial Swelling  . Ibuprofen Anaphylaxis  . Jynarque [Tolvaptan] Anaphylaxis  . Peanut Butter Flavor Anaphylaxis  . Cephalexin Rash    Current Outpatient Medications on File Prior to Visit  Medication Sig Dispense Refill  . acetaminophen (TYLENOL) 325 MG  tablet Take 1-2 tablets (325-650 mg total) by mouth every 6 (six) hours as needed for mild pain or moderate pain. 30 tablet 0  . amLODipine (NORVASC) 5 MG tablet TAKE ONE (1) TABLET BY MOUTH EACH DAY 90 tablet 1  . benazepril (LOTENSIN) 10 MG tablet TAKE 1 TABLET(10 MG) BY MOUTH DAILY 30 tablet 5  . EVZIO 2 MG/0.4ML SOAJ Call 911. Inject 1 auto-injector intramuscularly in shoulder or thigh. Repeat every 3 minutes as needed if no or minimal response.  0  . Fluticasone-Salmeterol (ADVAIR DISKUS) 250-50 MCG/DOSE AEPB INHALE 1 PUFF INTO THE LUNGS TWICE DAILY 60 each 5  . furosemide (LASIX) 20 MG tablet Take 1 tablet (20 mg total) by mouth daily as needed. (Patient taking differently: Take 20 mg by mouth daily as needed for fluid. ) 30 tablet 5  . gabapentin (NEURONTIN) 300 MG capsule Take 300 mg by mouth 3 (three) times daily.   0  . levothyroxine (SYNTHROID, LEVOTHROID) 200 MCG tablet TAKE ONE (1) TABLET BY MOUTH EACH DAY BEFORE BREAKFAST 90 tablet 1  . lovastatin (MEVACOR) 40 MG tablet TAKE ONE (1) TABLET BY MOUTH EACH DAY ATBEDTIME 90 tablet 1  . methocarbamol (ROBAXIN) 500 MG tablet Take 1 tablet (500 mg total) by mouth every 6 (six) hours as needed for muscle spasms. (Patient taking differently: Take 500 mg by mouth 2 (two) times daily. ) 50 tablet 0  . montelukast (SINGULAIR) 10 MG tablet TAKE ONE TABLET EVERY MORNING 90 tablet 1  . omeprazole (PRILOSEC) 40 MG capsule TAKE ONE (1) CAPSULE EACH DAY 90 capsule 1  . oxyCODONE-acetaminophen (PERCOCET) 7.5-325 MG tablet Take 1 tablet by mouth 2 (two) times daily.  0  . pantoprazole (PROTONIX) 40 MG tablet Take 1 tablet (40 mg total) by mouth daily. Take daily before breakfast on an empty stomach. 90 tablet 3  . ranitidine (ZANTAC) 150 MG capsule Take 1 capsule (150 mg total) by mouth 2 (two) times daily. 60 capsule 4  . tamsulosin (FLOMAX) 0.4 MG CAPS capsule Take 0.4 mg by mouth at bedtime.  0  . varenicline (CHANTIX CONTINUING MONTH PAK) 1 MG tablet  Take 1 tablet (1 mg total) by mouth 2 (two)  times daily. 60 tablet 1  . VENTOLIN HFA 108 (90 Base) MCG/ACT inhaler INHALE 2 PUFFS INTO THE LUNGS EVERY 4 HOURS AS NEEDED FOR WHEEZING ORSHORTNESS OF BREATH 18 g 5   No current facility-administered medications on file prior to visit.     BP 125/84 (BP Location: Right Arm, Patient Position: Sitting, Cuff Size: Large)   Pulse 75   Temp 98 F (36.7 C) (Oral)   Resp 16   SpO2 94%       Objective:   Physical Exam  Constitutional: He is oriented to person, place, and time. He appears well-developed and well-nourished. No distress.  HENT:  Head: Normocephalic and atraumatic.  Cardiovascular: Normal rate and regular rhythm.  No murmur heard. Pulmonary/Chest: Effort normal. No respiratory distress. He has wheezes.  Musculoskeletal: He exhibits no edema.  Neurological: He is alert and oriented to person, place, and time.  Skin: Skin is warm and dry.  Psychiatric: He has a normal mood and affect. His behavior is normal. Thought content normal.          Assessment & Plan:  Acute asthma exacerbation- patient will be treated with a steroid taper.  He will also be given a prescription for albuterol nebs.  He is advised to call if new or worsening symptoms or symptoms are not improved in 3 to 4 days.  Patient verbalizes understanding.

## 2018-07-12 DIAGNOSIS — Z79899 Other long term (current) drug therapy: Secondary | ICD-10-CM | POA: Diagnosis not present

## 2018-07-12 DIAGNOSIS — M25569 Pain in unspecified knee: Secondary | ICD-10-CM | POA: Diagnosis not present

## 2018-07-12 DIAGNOSIS — M179 Osteoarthritis of knee, unspecified: Secondary | ICD-10-CM | POA: Diagnosis not present

## 2018-07-12 DIAGNOSIS — G894 Chronic pain syndrome: Secondary | ICD-10-CM | POA: Diagnosis not present

## 2018-07-12 DIAGNOSIS — Z79891 Long term (current) use of opiate analgesic: Secondary | ICD-10-CM | POA: Diagnosis not present

## 2018-07-19 DIAGNOSIS — M179 Osteoarthritis of knee, unspecified: Secondary | ICD-10-CM | POA: Diagnosis not present

## 2018-07-20 ENCOUNTER — Ambulatory Visit (INDEPENDENT_AMBULATORY_CARE_PROVIDER_SITE_OTHER): Payer: Medicare HMO | Admitting: Family Medicine

## 2018-07-20 ENCOUNTER — Encounter: Payer: Self-pay | Admitting: Family Medicine

## 2018-07-20 VITALS — BP 132/92 | HR 83 | Temp 98.3°F | Ht 66.0 in | Wt 216.1 lb

## 2018-07-20 DIAGNOSIS — R030 Elevated blood-pressure reading, without diagnosis of hypertension: Secondary | ICD-10-CM | POA: Diagnosis not present

## 2018-07-20 DIAGNOSIS — J45901 Unspecified asthma with (acute) exacerbation: Secondary | ICD-10-CM | POA: Diagnosis not present

## 2018-07-20 MED ORDER — FLUTICASONE PROPIONATE HFA 110 MCG/ACT IN AERO: INHALATION_SPRAY | RESPIRATORY_TRACT | 1 refills | Status: DC

## 2018-07-20 MED ORDER — PREDNISONE 20 MG PO TABS: 40.0000 mg | ORAL_TABLET | Freq: Every day | ORAL | 0 refills | Status: AC

## 2018-07-20 MED ORDER — METHYLPREDNISOLONE ACETATE 80 MG/ML IJ SUSP
80.0000 mg | Freq: Once | INTRAMUSCULAR | Status: AC
  Administered 2018-07-20: 80 mg via INTRAMUSCULAR

## 2018-07-20 MED FILL — FLOVENT HFA 110 MCG INHALER: 110 | 30 days supply | Qty: 12 | Fill #0

## 2018-07-20 MED FILL — predniSONE 20 MG TABS: 20 | 5 days supply | Qty: 10 | Fill #0

## 2018-07-20 NOTE — Progress Notes (Signed)
Pre visit review using our clinic review tool, if applicable. No additional management support is needed unless otherwise documented below in the visit note. 

## 2018-07-20 NOTE — Patient Instructions (Addendum)
Start the prednisone tomorrow.  With the new inhaler: in the future if you start feeling that your asthma is not well controlled, start taking this inhaler twice daily with your Advair. This delivers your lungs a little more steroid. It could potentially save you a trip here, the urgent care or ER. You need to rinse your mouth out after using this also.  Asthma Action Plan There are 3 zones to consider: 1. Green Zone- This is the zone we hope to keep you in. Avoiding allergens and compliance with inhalers will keep you in this safe zone. No need to take anything extra while in this zone. 2. Yellow Zone- This zone is where we can save time, money and visits. You are not doing well enough to be considered in the green zone, but not bad enough to be in the red zone. This is where we need to remove any allergen or factor that is contributing to your breathing issues. You have a separate inhaler (inhaled steroid) to take twice daily in addition to your other inhaler. This should give you the push you need to get back to the green zone. Contact our office if you have any questions. 3. Red Zone- This is the zone where you should consider calling 911 vs going to the ER. Despite compliance with inhalers/meds (or maybe because we haven't been using them), our breathing isn't where we want it to be. Albuterol isn't helping as much either and you need medical care. This is the zone we try to avoid as much as possible!  Let us know if you need anything.

## 2018-07-20 NOTE — Progress Notes (Signed)
Chief Complaint  Patient presents with  . Wheezing    Subjective: Patient is a 57 y.o. male here for asthma exacerbation.  Patient has a history of moderate persistent asthma on Advair twice daily.  Over the past 3 days, he has been having increasing shortness of breath, chest tightness, and wheezing.  Albuterol has not been helpful.  When this happens in the past, he will need to come in and get steroids.  Stress and cold appear to be triggers for him.  No recent exposure to cold, however he has been under more stress over the past 2-3 weeks.  He has not been ill, denies any other upper respiratory symptoms.  ROS: Const: No fevers Lungs: +SOB   Past Medical History:  Diagnosis Date  . Arthritis    severe; pt takes strong pain meds daily/legs  . Asthma   . BPH (benign prostatic hypertrophy)   . Diverticulosis   . GERD with stricture   . Hiatal hernia   . History of stroke 2004  . Hyperlipidemia   . Hypertension   . Hypothyroid   . Lung nodule 02/03/2012  . Polycystic kidney disease   . Stroke ([place])   . Tubular adenoma of colon     Objective: BP (!) 132/92 (BP Location: Left Arm, Patient Position: Sitting, Cuff Size: Normal)   Pulse 83   Temp 98.3 F (36.8 C) (Oral)   Ht 5\' 6"  (1.676 m)   Wt 216 lb 2 oz (98 kg)   SpO2 95%   BMI 34.88 kg/m  General: Awake, appears stated age HEENT: MMM, EOMi, ears neg b/l Heart: RRR Lungs: +diffuse exp wheezing. No accessory muscle use Psych: Age appropriate judgment and insight, normal affect and mood  Assessment and Plan: Asthma with acute exacerbation, unspecified asthma severity, unspecified whether persistent - Plan: predniSONE (DELTASONE) 20 MG tablet, fluticasone (FLOVENT HFA) 110 MCG/ACT inhaler, methylPREDNISolone acetate (DEPO-MEDROL) injection 80 mg  Elevated blood pressure reading  Orders as above. Depakote today, start prednisone burst tomorrow.  Flovent was called in should he start having "yellow zone" symptoms in the  future.  Information on this was provided in his AVS.  He was reminded to rinse his mouth out after use of this new inhaler as well. Mind blood pressure in the future and follow-up with reg PCP as originally scheduled. The patient voiced understanding and agreement to the plan.  [place], DO 07/20/18  12:01 PM

## 2018-07-26 DIAGNOSIS — M179 Osteoarthritis of knee, unspecified: Secondary | ICD-10-CM | POA: Diagnosis not present

## 2018-08-09 DIAGNOSIS — M179 Osteoarthritis of knee, unspecified: Secondary | ICD-10-CM | POA: Diagnosis not present

## 2018-08-09 DIAGNOSIS — G894 Chronic pain syndrome: Secondary | ICD-10-CM | POA: Diagnosis not present

## 2018-08-09 DIAGNOSIS — M25569 Pain in unspecified knee: Secondary | ICD-10-CM | POA: Diagnosis not present

## 2018-08-09 DIAGNOSIS — Z79891 Long term (current) use of opiate analgesic: Secondary | ICD-10-CM | POA: Diagnosis not present

## 2018-08-09 DIAGNOSIS — Z79899 Other long term (current) drug therapy: Secondary | ICD-10-CM | POA: Diagnosis not present

## 2018-08-09 DIAGNOSIS — M545 Low back pain: Secondary | ICD-10-CM | POA: Diagnosis not present

## 2018-08-14 DIAGNOSIS — H52209 Unspecified astigmatism, unspecified eye: Secondary | ICD-10-CM | POA: Diagnosis not present

## 2018-08-14 DIAGNOSIS — H5203 Hypermetropia, bilateral: Secondary | ICD-10-CM | POA: Diagnosis not present

## 2018-08-14 DIAGNOSIS — H524 Presbyopia: Secondary | ICD-10-CM | POA: Diagnosis not present

## 2018-08-17 ENCOUNTER — Other Ambulatory Visit: Payer: Self-pay | Admitting: Internal Medicine

## 2018-08-17 DIAGNOSIS — R69 Illness, unspecified: Secondary | ICD-10-CM | POA: Diagnosis not present

## 2018-08-17 DIAGNOSIS — K219 Gastro-esophageal reflux disease without esophagitis: Secondary | ICD-10-CM

## 2018-08-20 ENCOUNTER — Ambulatory Visit (HOSPITAL_BASED_OUTPATIENT_CLINIC_OR_DEPARTMENT_OTHER)
Admission: RE | Admit: 2018-08-20 | Discharge: 2018-08-20 | Disposition: A | Discharge status: Home / Self Care | Payer: Medicare HMO | Source: Ambulatory Visit | Attending: Family | Admitting: Family

## 2018-08-20 ENCOUNTER — Encounter: Payer: Self-pay | Admitting: Family

## 2018-08-20 ENCOUNTER — Ambulatory Visit (INDEPENDENT_AMBULATORY_CARE_PROVIDER_SITE_OTHER): Payer: Medicare HMO | Admitting: Family

## 2018-08-20 ENCOUNTER — Ambulatory Visit: Payer: Self-pay | Admitting: Family

## 2018-08-20 VITALS — BP 150/81 | HR 86 | Temp 98.1°F | Resp 18 | Ht 66.0 in | Wt 223.0 lb

## 2018-08-20 DIAGNOSIS — J45901 Unspecified asthma with (acute) exacerbation: Secondary | ICD-10-CM

## 2018-08-20 DIAGNOSIS — Q2546 Tortuous aortic arch: Secondary | ICD-10-CM | POA: Insufficient documentation

## 2018-08-20 DIAGNOSIS — R0602 Shortness of breath: Secondary | ICD-10-CM | POA: Diagnosis not present

## 2018-08-20 MED ORDER — PREDNISONE 10 MG PO TABS: ORAL_TABLET | ORAL | 0 refills | Status: DC

## 2018-08-20 MED ORDER — AZITHROMYCIN 250 MG PO TABS: ORAL_TABLET | ORAL | 0 refills | Status: DC

## 2018-08-20 MED ORDER — METHYLPREDNISOLONE SODIUM SUCC 125 MG IJ SOLR
125.0000 mg | Freq: Once | INTRAMUSCULAR | Status: AC
  Administered 2018-08-20: 125 mg via INTRAMUSCULAR

## 2018-08-20 MED FILL — predniSONE 10 MG TABS: 10 | 12 days supply | Qty: 30 | Fill #0

## 2018-08-20 MED FILL — AZITHROMYCIN 250 MG TABLET: 250 | 5 days supply | Qty: 6 | Fill #0

## 2018-08-20 NOTE — Patient Instructions (Signed)
Please begin prednisone tomorrow and azithromycin (antibiotic) today. Call if symptoms worsen or if not improved in 3 days.  Continue albuterol nebulizer every 6 hours until your breathing improves.  Complete chest x-ray on the first floor.

## 2018-08-20 NOTE — Progress Notes (Signed)
Subjective:    Patient ID: Kyle Blair, male    DOB: 09-19-1961, 57 y.o.   MRN: [id number]  HPI  Mr. Spratlin is a 57 yr old male who presents today with chief complaint of wheezing and associated tightness of breathing. Symptoms began lat Thursday. Has been using nebulizer and albuterol inhaler without improvement. Denies fever but has had some night sweats x 3 days.   Review of Systems See HPI  Past Medical History:  Diagnosis Date  . Arthritis    severe; pt takes strong pain meds daily/legs  . Asthma   . BPH (benign prostatic hypertrophy)   . Diverticulosis   . GERD with stricture   . Hiatal hernia   . History of stroke 2004  . Hyperlipidemia   . Hypertension   . Hypothyroid   . Lung nodule 02/03/2012  . Polycystic kidney disease   . Stroke ([place])   . Tubular adenoma of colon      Social History   Socioeconomic History  . Marital status: Married    Spouse name: Not on file  . Number of children: 2  . Years of education: Not on file  . Highest education level: Not on file  Occupational History  . Occupation: DISABLED    Employer: UNEMPLOYED  Social Needs  . Financial resource strain: Not on file  . Food insecurity:    Worry: Not on file    Inability: Not on file  . Transportation needs:    Medical: Not on file    Non-medical: Not on file  Tobacco Use  . Smoking status: Current Every Day Smoker    Packs/day: 6.00    Years: 20.00    Pack years: 120.00    Types: Cigarettes  . Smokeless tobacco: Never Used  . Tobacco comment: 5-6 cigarettes a day  Substance and Sexual Activity  . Alcohol use: No    Alcohol/week: 0.0 standard drinks  . Drug use: No  . Sexual activity: Not on file  Lifestyle  . Physical activity:    Days per week: Not on file    Minutes per session: Not on file  . Stress: Not on file  Relationships  . Social connections:    Talks on phone: Not on file    Gets together: Not on file    Attends religious service: Not on file   Active member of club or organization: Not on file    Attends meetings of clubs or organizations: Not on file    Relationship status: Not on file  . Intimate partner violence:    Fear of current or ex partner: Not on file    Emotionally abused: Not on file    Physically abused: Not on file    Forced sexual activity: Not on file  Other Topics Concern  . Not on file  Social History Narrative   Caffeine use:  1 pot coffee daily, 1 glass tea   Regular exercise:  No. Has active lifestyle.   2 biological and 1 stepson.   Former smoker- quit in 2007   He is on disability- reports that he has a history of heat stroke.               Past Surgical History:  Procedure Laterality Date  . CARPAL TUNNEL RELEASE    . COLONOSCOPY    . HERNIA REPAIR  2005   with mesh  . KNEE ARTHROSCOPY Bilateral 12/05/13   cyst and bone spur removed from left knee per  pt.  . KNEE ARTHROSCOPY Left 01/2015  . NASAL POLYP EXCISION  1992 or 93  . POLYPECTOMY    . TOTAL KNEE ARTHROPLASTY Right 05/19/2015   Procedure: RIGHT TOTAL KNEE ARTHROPLASTY;  Surgeon: Paralee Cancel, MD;  Location: [facility];  Service: Orthopedics;  Laterality: Right;    Family History  Problem Relation Age of Onset  . Heart disease Mother        pacemaker  . Diabetes Father   . Kidney disease Father   . Diabetes Sister   . Kidney disease Sister   . Colon cancer Neg Hx   . Esophageal cancer Neg Hx   . Rectal cancer Neg Hx   . Stomach cancer Neg Hx   . Prostate cancer Neg Hx   . Pancreatic cancer Neg Hx     Allergies  Allergen Reactions  . Aspirin Anaphylaxis  . Doxycycline Hyclate Swelling    Facial Swelling  . Ibuprofen Anaphylaxis  . Jynarque [Tolvaptan] Anaphylaxis  . Peanut Butter Flavor Anaphylaxis  . Cephalexin Rash    Current Outpatient Medications on File Prior to Visit  Medication Sig Dispense Refill  . acetaminophen (TYLENOL) 325 MG tablet Take 1-2 tablets (325-650 mg total) by mouth every 6 (six) hours as needed  for mild pain or moderate pain. 30 tablet 0  . albuterol (PROVENTIL) (2.5 MG/3ML) 0.083% nebulizer solution Take 3 mLs (2.5 mg total) by nebulization every 6 (six) hours as needed for wheezing or shortness of breath. 75 mL 12  . amLODipine (NORVASC) 5 MG tablet TAKE ONE (1) TABLET BY MOUTH EACH DAY 90 tablet 1  . benazepril (LOTENSIN) 10 MG tablet TAKE 1 TABLET(10 MG) BY MOUTH DAILY 30 tablet 5  . EVZIO 2 MG/0.4ML SOAJ Call 911. Inject 1 auto-injector intramuscularly in shoulder or thigh. Repeat every 3 minutes as needed if no or minimal response.  0  . fluticasone (FLOVENT HFA) 110 MCG/ACT inhaler 2 puffs twice daily when your asthma is not well controlled. 1 Inhaler 1  . Fluticasone-Salmeterol (ADVAIR DISKUS) 250-50 MCG/DOSE AEPB INHALE 1 PUFF INTO THE LUNGS TWICE DAILY 60 each 5  . furosemide (LASIX) 20 MG tablet Take 1 tablet (20 mg total) by mouth daily as needed. (Patient taking differently: Take 20 mg by mouth daily as needed for fluid. ) 30 tablet 5  . gabapentin (NEURONTIN) 300 MG capsule Take 300 mg by mouth 3 (three) times daily.   0  . levothyroxine (SYNTHROID, LEVOTHROID) 200 MCG tablet TAKE ONE (1) TABLET BY MOUTH EACH DAY BEFORE BREAKFAST 90 tablet 1  . lovastatin (MEVACOR) 40 MG tablet TAKE ONE (1) TABLET BY MOUTH EACH DAY ATBEDTIME 90 tablet 1  . methocarbamol (ROBAXIN) 500 MG tablet Take 1 tablet (500 mg total) by mouth every 6 (six) hours as needed for muscle spasms. (Patient taking differently: Take 500 mg by mouth 2 (two) times daily. ) 50 tablet 0  . montelukast (SINGULAIR) 10 MG tablet TAKE ONE TABLET EVERY MORNING 90 tablet 1  . oxyCODONE-acetaminophen (PERCOCET) 7.5-325 MG tablet Take 1 tablet by mouth 3 (three) times daily.    . pantoprazole (PROTONIX) 40 MG tablet Take 1 tablet (40 mg total) by mouth daily. Take daily before breakfast on an empty stomach. 90 tablet 3  . ranitidine (ZANTAC) 150 MG capsule Take 1 capsule (150 mg total) by mouth 2 (two) times daily. 60 capsule  4  . tamsulosin (FLOMAX) 0.4 MG CAPS capsule Take 0.4 mg by mouth at bedtime.  0  . varenicline (CHANTIX  CONTINUING MONTH PAK) 1 MG tablet Take 1 tablet (1 mg total) by mouth 2 (two) times daily. 60 tablet 1  . VENTOLIN HFA 108 (90 Base) MCG/ACT inhaler INHALE 2 PUFFS INTO THE LUNGS EVERY 4 HOURS AS NEEDED FOR WHEEZING ORSHORTNESS OF BREATH 18 g 5  . XTAMPZA ER 18 MG C12A Take 1 capsule by mouth 2 (two) times daily.     No current facility-administered medications on file prior to visit.     BP (!) 150/81 (BP Location: Right Arm, Cuff Size: Large)   Pulse 86   Temp 98.1 F (36.7 C) (Oral)   Resp 18   Ht 5\' 6"  (1.676 m)   Wt 223 lb (101.2 kg)   SpO2 95%   BMI 35.99 kg/m       Objective:   Physical Exam  Constitutional: He is oriented to person, place, and time. He appears well-developed and well-nourished. No distress.  HENT:  Head: Normocephalic and atraumatic.  Cardiovascular: Normal rate and regular rhythm.  No murmur heard. Pulmonary/Chest: Effort normal. No respiratory distress. He has wheezes. He has no rales.  Diminished breath sounds to bases.    Musculoskeletal: He exhibits no edema.  Neurological: He is alert and oriented to person, place, and time.  Skin: Skin is warm and dry.  Psychiatric: He has a normal mood and affect. His behavior is normal. Thought content normal.          Assessment & Plan:  Acute asthma exacerbation- solumedrol 125mg  IM today. To be followed by prednisone taper. Refer for chest x-ray. Empiric rx with azithromycin due to recent night sweats. Continue albuterol nebs q6 hrs. Pt is advised to call if new/worsening symptoms or if symptoms do not improve.

## 2018-08-20 NOTE — Addendum Note (Signed)
Addended by: Kelle Darting A on: 08/20/2018 05:32 PM   Modules accepted: Orders

## 2018-08-20 NOTE — Telephone Encounter (Signed)
   Answer Assessment - Initial Assessment Questions 1. RESPIRATORY STATUS: "Describe your breathing?" (e.g., wheezing, shortness of breath, unable to speak, severe coughing)      Wheezing- asthma 2. ONSET: "When did this breathing problem begin?"      3 day Patient refuses triage- he just wants appointment with his PCP for his asthma. Appointment scheduled per patient request.  Protocols used: BREATHING DIFFICULTY-A-AH

## 2018-08-21 DIAGNOSIS — N181 Chronic kidney disease, stage 1: Secondary | ICD-10-CM | POA: Diagnosis not present

## 2018-08-21 DIAGNOSIS — I129 Hypertensive chronic kidney disease with stage 1 through stage 4 chronic kidney disease, or unspecified chronic kidney disease: Secondary | ICD-10-CM | POA: Diagnosis not present

## 2018-08-21 DIAGNOSIS — Z720 Tobacco use: Secondary | ICD-10-CM | POA: Diagnosis not present

## 2018-08-21 DIAGNOSIS — Q612 Polycystic kidney, adult type: Secondary | ICD-10-CM | POA: Diagnosis not present

## 2018-08-30 DIAGNOSIS — M1712 Unilateral primary osteoarthritis, left knee: Secondary | ICD-10-CM | POA: Diagnosis not present

## 2018-08-30 DIAGNOSIS — M25462 Effusion, left knee: Secondary | ICD-10-CM | POA: Diagnosis not present

## 2018-09-06 ENCOUNTER — Other Ambulatory Visit: Payer: Self-pay | Admitting: Family

## 2018-09-06 ENCOUNTER — Other Ambulatory Visit: Payer: Self-pay | Admitting: Internal Medicine

## 2018-09-06 DIAGNOSIS — M545 Low back pain: Secondary | ICD-10-CM | POA: Diagnosis not present

## 2018-09-06 DIAGNOSIS — E669 Obesity, unspecified: Secondary | ICD-10-CM | POA: Diagnosis not present

## 2018-09-06 DIAGNOSIS — K219 Gastro-esophageal reflux disease without esophagitis: Secondary | ICD-10-CM

## 2018-09-06 DIAGNOSIS — M179 Osteoarthritis of knee, unspecified: Secondary | ICD-10-CM | POA: Diagnosis not present

## 2018-09-06 DIAGNOSIS — Z79891 Long term (current) use of opiate analgesic: Secondary | ICD-10-CM | POA: Diagnosis not present

## 2018-09-06 DIAGNOSIS — G894 Chronic pain syndrome: Secondary | ICD-10-CM | POA: Diagnosis not present

## 2018-09-06 DIAGNOSIS — Z79899 Other long term (current) drug therapy: Secondary | ICD-10-CM | POA: Diagnosis not present

## 2018-09-07 NOTE — Telephone Encounter (Signed)
Kyle Blair -- please advise Tamsulosin refills. Looks like it was removed in 2018 as "pt preference"?Marland Kitchen

## 2018-09-09 NOTE — Telephone Encounter (Signed)
Please ask pt if he has been taking regularly or if he stopped and is having symptoms.  OK to send refill.

## 2018-09-10 NOTE — Telephone Encounter (Signed)
Spoke with pt. He states he has not been having symptoms. He was just refilling his pill box from his bottles that were out of medication. He states he is ok to continue off of medication at this time and will call for a refill if his symptoms return.

## 2018-09-13 ENCOUNTER — Other Ambulatory Visit (HOSPITAL_BASED_OUTPATIENT_CLINIC_OR_DEPARTMENT_OTHER): Payer: Self-pay | Admitting: Orthopedic Surgery

## 2018-09-13 DIAGNOSIS — M25462 Effusion, left knee: Secondary | ICD-10-CM

## 2018-09-13 DIAGNOSIS — M1712 Unilateral primary osteoarthritis, left knee: Secondary | ICD-10-CM

## 2018-09-15 ENCOUNTER — Ambulatory Visit (HOSPITAL_BASED_OUTPATIENT_CLINIC_OR_DEPARTMENT_OTHER)
Admission: RE | Admit: 2018-09-15 | Discharge: 2018-09-15 | Disposition: A | Discharge status: Home / Self Care | Payer: Medicare HMO | Source: Ambulatory Visit | Attending: Orthopedic Surgery | Admitting: Orthopedic Surgery

## 2018-09-15 DIAGNOSIS — M25462 Effusion, left knee: Secondary | ICD-10-CM | POA: Insufficient documentation

## 2018-09-15 DIAGNOSIS — M1712 Unilateral primary osteoarthritis, left knee: Secondary | ICD-10-CM | POA: Insufficient documentation

## 2018-09-15 DIAGNOSIS — M25562 Pain in left knee: Secondary | ICD-10-CM | POA: Diagnosis not present

## 2018-09-20 ENCOUNTER — Telehealth: Payer: Self-pay | Admitting: Family

## 2018-09-20 NOTE — Telephone Encounter (Signed)
Pt called to check status. Advised pt that [name] NP is out of the office today. Pt requesting call back once RX is sent in to the pharmacy.  Home # [phone]

## 2018-09-20 NOTE — Telephone Encounter (Signed)
Copied from [place] [id number]. Topic: General - Other >> Sep 20, 2018  9:29 AM Keene Breath wrote: Reason for CRM: Patient called to speak with the nurse or doctor Inda Castle to see if she could call in the yellow syrup for his thrush.  Patient stated that he gets this occasionally and his throat is very soar.  He would like it to go to the [facility], [place] - [address] [phone] (Phone) [phone] (Fax).  Please call patient when this has been done at CB# [phone] or [phone]

## 2018-09-21 MED ORDER — NYSTATIN 100000 UNIT/ML MT SUSP: 5.0000 mL | Freq: Four times a day (QID) | OROMUCOSAL | 0 refills | Status: DC

## 2018-09-21 NOTE — Telephone Encounter (Signed)
Rx sent to his pharmacy for nystatin.

## 2018-09-21 NOTE — Telephone Encounter (Signed)
Pt is calling back in for a update on medication for thrush

## 2018-09-24 NOTE — Telephone Encounter (Signed)
Pt. Did get medication

## 2018-09-26 MED FILL — FLOVENT HFA 110 MCG INHALER: 110 | 30 days supply | Qty: 12 | Fill #1

## 2018-10-02 ENCOUNTER — Other Ambulatory Visit: Payer: Self-pay | Admitting: Family

## 2018-10-02 DIAGNOSIS — M1712 Unilateral primary osteoarthritis, left knee: Secondary | ICD-10-CM | POA: Diagnosis not present

## 2018-10-02 DIAGNOSIS — M25562 Pain in left knee: Secondary | ICD-10-CM | POA: Diagnosis not present

## 2018-10-03 ENCOUNTER — Encounter: Payer: Self-pay | Admitting: Family

## 2018-10-03 ENCOUNTER — Ambulatory Visit (INDEPENDENT_AMBULATORY_CARE_PROVIDER_SITE_OTHER): Payer: Medicare HMO | Admitting: Family

## 2018-10-03 VITALS — BP 151/94 | HR 87 | Temp 97.6°F | Resp 16 | Ht 66.0 in | Wt 221.2 lb

## 2018-10-03 DIAGNOSIS — K047 Periapical abscess without sinus: Secondary | ICD-10-CM | POA: Diagnosis not present

## 2018-10-03 DIAGNOSIS — I10 Essential (primary) hypertension: Secondary | ICD-10-CM | POA: Diagnosis not present

## 2018-10-03 MED ORDER — AMOXICILLIN-POT CLAVULANATE 875-125 MG PO TABS: 1.0000 | ORAL_TABLET | Freq: Two times a day (BID) | ORAL | 0 refills | Status: DC

## 2018-10-03 MED ORDER — AMLODIPINE BESYLATE 10 MG PO TABS: 10.0000 mg | ORAL_TABLET | Freq: Every day | ORAL | 3 refills | Status: DC

## 2018-10-03 NOTE — Progress Notes (Signed)
Subjective:    Patient ID: Kyle Blair, male    DOB: [birth year], 57 y.o.   MRN: [id number]  HPI  Patient is a 57 year old male who presents today with chief complaint of left-sided ear pain.  He reports that symptoms began approximately 2 weeks ago and are worsening.  Reports that he has had a tooth ache on the left side and a sore throat.  Denies nasal congestion of fever.   Review of Systems    see HPI  Past Medical History:  Diagnosis Date  . Arthritis    severe; pt takes strong pain meds daily/legs  . Asthma   . BPH (benign prostatic hypertrophy)   . Diverticulosis   . GERD with stricture   . Hiatal hernia   . History of stroke 2004  . Hyperlipidemia   . Hypertension   . Hypothyroid   . Lung nodule 02/03/2012  . Polycystic kidney disease   . Stroke ([place])   . Tubular adenoma of colon      Social History   Socioeconomic History  . Marital status: Married    Spouse name: Not on file  . Number of children: 2  . Years of education: Not on file  . Highest education level: Not on file  Occupational History  . Occupation: DISABLED    Employer: UNEMPLOYED  Social Needs  . Financial resource strain: Not on file  . Food insecurity:    Worry: Not on file    Inability: Not on file  . Transportation needs:    Medical: Not on file    Non-medical: Not on file  Tobacco Use  . Smoking status: Current Every Day Smoker    Packs/day: 6.00    Years: 20.00    Pack years: 120.00    Types: Cigarettes  . Smokeless tobacco: Never Used  . Tobacco comment: 5-6 cigarettes a day  Substance and Sexual Activity  . Alcohol use: No    Alcohol/week: 0.0 standard drinks  . Drug use: No  . Sexual activity: Not on file  Lifestyle  . Physical activity:    Days per week: Not on file    Minutes per session: Not on file  . Stress: Not on file  Relationships  . Social connections:    Talks on phone: Not on file    Gets together: Not on file    Attends religious service: Not on  file    Active member of club or organization: Not on file    Attends meetings of clubs or organizations: Not on file    Relationship status: Not on file  . Intimate partner violence:    Fear of current or ex partner: Not on file    Emotionally abused: Not on file    Physically abused: Not on file    Forced sexual activity: Not on file  Other Topics Concern  . Not on file  Social History Narrative   Caffeine use:  1 pot coffee daily, 1 glass tea   Regular exercise:  No. Has active lifestyle.   2 biological and 1 stepson.   Former smoker- quit in 2007   He is on disability- reports that he has a history of heat stroke.               Past Surgical History:  Procedure Laterality Date  . CARPAL TUNNEL RELEASE    . COLONOSCOPY    . HERNIA REPAIR  2005   with mesh  . KNEE ARTHROSCOPY Bilateral 12/05/13  cyst and bone spur removed from left knee per pt.  Marland Kitchen KNEE ARTHROSCOPY Left 01/2015  . NASAL POLYP EXCISION  1992 or 93  . POLYPECTOMY    . TOTAL KNEE ARTHROPLASTY Right 05/19/2015   Procedure: RIGHT TOTAL KNEE ARTHROPLASTY;  Surgeon: Paralee Cancel, MD;  Location: [facility];  Service: Orthopedics;  Laterality: Right;    Family History  Problem Relation Age of Onset  . Heart disease Mother        pacemaker  . Diabetes Father   . Kidney disease Father   . Diabetes Sister   . Kidney disease Sister   . Colon cancer Neg Hx   . Esophageal cancer Neg Hx   . Rectal cancer Neg Hx   . Stomach cancer Neg Hx   . Prostate cancer Neg Hx   . Pancreatic cancer Neg Hx     Allergies  Allergen Reactions  . Aspirin Anaphylaxis  . Doxycycline Hyclate Swelling    Facial Swelling  . Ibuprofen Anaphylaxis  . Jynarque [Tolvaptan] Anaphylaxis  . Peanut Butter Flavor Anaphylaxis  . Cephalexin Rash    Current Outpatient Medications on File Prior to Visit  Medication Sig Dispense Refill  . acetaminophen (TYLENOL) 325 MG tablet Take 1-2 tablets (325-650 mg total) by mouth every 6 (six) hours  as needed for mild pain or moderate pain. 30 tablet 0  . albuterol (PROVENTIL) (2.5 MG/3ML) 0.083% nebulizer solution Take 3 mLs (2.5 mg total) by nebulization every 6 (six) hours as needed for wheezing or shortness of breath. 75 mL 12  . amLODipine (NORVASC) 5 MG tablet TAKE ONE (1) TABLET BY MOUTH EACH DAY 90 tablet 1  . azithromycin (ZITHROMAX) 250 MG tablet 2 tabs by mouth today, then one tab once daily for 4 more days 6 tablet 0  . benazepril (LOTENSIN) 10 MG tablet TAKE 1 TABLET(10 MG) BY MOUTH DAILY 30 tablet 5  . EVZIO 2 MG/0.4ML SOAJ Call 911. Inject 1 auto-injector intramuscularly in shoulder or thigh. Repeat every 3 minutes as needed if no or minimal response.  0  . fluticasone (FLOVENT HFA) 110 MCG/ACT inhaler 2 puffs twice daily when your asthma is not well controlled. 1 Inhaler 1  . Fluticasone-Salmeterol (ADVAIR DISKUS) 250-50 MCG/DOSE AEPB INHALE 1 PUFF INTO THE LUNGS TWICE DAILY 60 each 5  . furosemide (LASIX) 20 MG tablet Take 1 tablet (20 mg total) by mouth daily as needed. (Patient taking differently: Take 20 mg by mouth daily as needed for fluid. ) 30 tablet 5  . gabapentin (NEURONTIN) 300 MG capsule Take 300 mg by mouth 3 (three) times daily.   0  . levothyroxine (SYNTHROID, LEVOTHROID) 200 MCG tablet TAKE ONE (1) TABLET BY MOUTH EACH DAY BEFORE BREAKFAST 90 tablet 1  . lovastatin (MEVACOR) 40 MG tablet TAKE ONE (1) TABLET BY MOUTH EACH DAY ATBEDTIME 90 tablet 1  . methocarbamol (ROBAXIN) 500 MG tablet Take 1 tablet (500 mg total) by mouth every 6 (six) hours as needed for muscle spasms. (Patient taking differently: Take 500 mg by mouth 2 (two) times daily. ) 50 tablet 0  . montelukast (SINGULAIR) 10 MG tablet TAKE ONE TABLET EVERY MORNING 90 tablet 1  . nystatin (MYCOSTATIN) 100000 UNIT/ML suspension Take 5 mLs (500,000 Units total) by mouth 4 (four) times daily. 200 mL 0  . oxyCODONE-acetaminophen (PERCOCET) 7.5-325 MG tablet Take 1 tablet by mouth 3 (three) times daily.    .  pantoprazole (PROTONIX) 40 MG tablet TAKE ONE (1) TABLET BY MOUTH EVERY DAY  BEFORE BREAKFAST ON AN EMPTY STOMACH 90 tablet 3  . predniSONE (DELTASONE) 10 MG tablet 4 tabs by mouth once daily for 3 days, then 3 tabs daily x 3 days, then 2 tabs daily x 3 days, then 1 tab daily x 3 days 30 tablet 0  . ranitidine (ZANTAC) 150 MG capsule Take 1 capsule (150 mg total) by mouth 2 (two) times daily. 60 capsule 4  . tamsulosin (FLOMAX) 0.4 MG CAPS capsule Take 0.4 mg by mouth at bedtime.  0  . varenicline (CHANTIX CONTINUING MONTH PAK) 1 MG tablet Take 1 tablet (1 mg total) by mouth 2 (two) times daily. 60 tablet 1  . VENTOLIN HFA 108 (90 Base) MCG/ACT inhaler INHALE 2 PUFFS INTO THE LUNGS EVERY 4 HOURS AS NEEDED FOR WHEEZING ORSHORTNESS OF BREATH 18 g 5  . XTAMPZA ER 18 MG C12A Take 1 capsule by mouth 2 (two) times daily.     No current facility-administered medications on file prior to visit.     BP (!) 151/94 (BP Location: Left Arm, Patient Position: Sitting, Cuff Size: Large)   Pulse 87   Temp 97.6 F (36.4 C) (Oral)   Resp 16   Ht 5\' 6"  (1.676 m)   Wt 221 lb 3.2 oz (100.3 kg)   SpO2 96%   BMI 35.70 kg/m    Objective:   Physical Exam  Constitutional: He is oriented to person, place, and time. He appears well-developed and well-nourished. No distress.  HENT:  Head: Normocephalic and atraumatic.  Right Ear: Tympanic membrane and ear canal normal.  Left Ear: Tympanic membrane and ear canal normal.  Mouth/Throat: No oropharyngeal exudate, posterior oropharyngeal edema or posterior oropharyngeal erythema.  Decayed molars noted left upper posterior and left lower posterior mouth.  Multiple missing teeth noted  Cardiovascular: Normal rate and regular rhythm.  No murmur heard. Pulmonary/Chest: Effort normal and breath sounds normal. No respiratory distress. He has no wheezes. He has no rales.  Musculoskeletal: He exhibits no edema.  Neurological: He is alert and oriented to person, place, and  time.  Skin: Skin is warm and dry.  Psychiatric: He has a normal mood and affect. His behavior is normal. Thought content normal.          Assessment & Plan:  HTN-blood pressure is uncontrolled.  Will increase amlodipine from 5 mg to 10 mg daily.  Plan to repeat blood pressure in 1 month.  Flu shot today. BP Readings from Last 3 Encounters:  10/03/18 (!) 151/94  08/20/18 (!) 150/81  07/20/18 (!) 132/92   Dental abscess- suspect that his left ear pain is referred pain from dental abscess.  He is having financial difficulties making it hard for him to afford dental care.  I have given him a prescription for Augmentin to begin.  He denies history of allergic reaction to penicillins or amoxicillin is aware of.  He has had a rash with cephalexin in the past.  He did not have any signs of anaphylaxis.  I did advise and let me know if he has any rash or side effects from the Augmentin.

## 2018-10-03 NOTE — Patient Instructions (Signed)
Please begin augmentin for dental infection. Schedule an appointment with a dentist when you can. Call if symptoms worsen or if they fail to improve. Increase amlodipine from 5mg  to 10mg .

## 2018-10-04 DIAGNOSIS — M545 Low back pain: Secondary | ICD-10-CM | POA: Diagnosis not present

## 2018-10-04 DIAGNOSIS — M179 Osteoarthritis of knee, unspecified: Secondary | ICD-10-CM | POA: Diagnosis not present

## 2018-10-04 DIAGNOSIS — G894 Chronic pain syndrome: Secondary | ICD-10-CM | POA: Diagnosis not present

## 2018-10-04 DIAGNOSIS — Z79891 Long term (current) use of opiate analgesic: Secondary | ICD-10-CM | POA: Diagnosis not present

## 2018-10-04 DIAGNOSIS — Z79899 Other long term (current) drug therapy: Secondary | ICD-10-CM | POA: Diagnosis not present

## 2018-10-04 NOTE — Telephone Encounter (Signed)
Flomax last prescribed by historical provider. Order routed to PCP to approve.

## 2018-10-08 ENCOUNTER — Ambulatory Visit: Payer: Medicare HMO

## 2018-10-17 ENCOUNTER — Ambulatory Visit: Payer: Self-pay | Admitting: *Deleted

## 2018-10-17 NOTE — Telephone Encounter (Signed)
  Reason for Disposition . RN needs further essential information from caller in order to complete triage    Patient is calling to report high BP- hung up on agent- does not want to be triaged- did call him back to get information from him- did not work him through triage- did not want him to hang up- tried to impress importance of follow up on this. Declines hospital.  Answer Assessment - Initial Assessment Questions 1. REASON FOR CALL or QUESTION: "What is your reason for calling today?" or "How can I best help you?" or "What question do you have that I can help answer?"     High BP- wants provider to know. Does not want to be triaged.  Protocols used: INFORMATION ONLY CALL-A-AH

## 2018-10-17 NOTE — Telephone Encounter (Addendum)
Patient is calling with high blood pressure- 162/114. Patient reports he has been having some SOB and headache. He reports he was not feeling well to day so he checked his blood pressure- it is elevated. Patient states he is not going to the hospital and does not want to leave the house today. Discuss warning signs of heart attack and that he needs to get the BP down- he states he understands- he has had a stroke before. He is calling to let Melissa know what his BP is. Patient declines triage- hung up on agent- I called him back because I felt it was important someone speak to him. Call to office to let them know about this patient- will route high priority per request.

## 2018-10-18 NOTE — Telephone Encounter (Signed)
Author phoned pt., spoke to wife. Wife states his BP was elevated while out shopping with wife. Wife states it is probably due to his tooth and knee pain. Wife states she is working on getting a Pharmacist, community, but sounded disinterested d/t financial concerns. Author asked wife to tell pt. to increase benzapril to 2 tabs per Melissa's instructions, and to make an appointment with PCP asap. Wife said she would relay the message. Routed to [place], CMA to follow up on 10/25 or soon thereafter.

## 2018-10-18 NOTE — Telephone Encounter (Signed)
Please increase benazapril to 2 tabs daily. Needs To go to ED if chest pain SOB. Please schedule follow up with me tomorrow.

## 2018-10-18 NOTE — Telephone Encounter (Signed)
fyi Call home and cell numbers but no answer. Lm for patient to call us back and let us know how he is doing today.

## 2018-10-19 NOTE — Telephone Encounter (Signed)
Noted, would you please check back with him on Monday and schedule a follow up in the office next week?

## 2018-10-19 NOTE — Telephone Encounter (Signed)
Called patient this morning to f/up on his blood pressure, he seems to be confused with yesterday's instructions and took 3 Benazepril. Advised patient to take only 2 today and be sure to check BP and call us to let us know if better today. He reports his home cuff is broken and will go to [employer] later today to have it check.

## 2018-10-22 NOTE — Telephone Encounter (Signed)
Attempted to reach pt. Home # rang several times then disconnected. Left detailed message on cell # to call and schedule OV this week.

## 2018-10-23 NOTE — Telephone Encounter (Signed)
Called home # and phone rang several times then disconnected w/out voicemail. Called cell# that said I had reached Sharyn Lull (spouse) and left message to have [name] return our call. Ida for [facility] / triage to discuss / schedule with pt when he returns call.

## 2018-10-31 ENCOUNTER — Other Ambulatory Visit: Payer: Self-pay | Admitting: Orthopedic Surgery

## 2018-11-01 DIAGNOSIS — G894 Chronic pain syndrome: Secondary | ICD-10-CM | POA: Diagnosis not present

## 2018-11-01 DIAGNOSIS — Z79899 Other long term (current) drug therapy: Secondary | ICD-10-CM | POA: Diagnosis not present

## 2018-11-01 DIAGNOSIS — M179 Osteoarthritis of knee, unspecified: Secondary | ICD-10-CM | POA: Diagnosis not present

## 2018-11-01 DIAGNOSIS — Z79891 Long term (current) use of opiate analgesic: Secondary | ICD-10-CM | POA: Diagnosis not present

## 2018-11-09 NOTE — Pre-Procedure Instructions (Signed)
Kyle Blair  11/09/2018      [facility] - [place], [place] - [address] [place] [address] Phone: [phone] Fax: [place], [place] [address] Phone: [phone] Fax: [facility] #[id number] - [place], [place] - [address] AT [place] [address] [place] [address] Phone: [phone] Fax: [phone]    Your procedure is scheduled on 11/19/18.  Report to [facility] Admitting at 8 A.M.  Call this number if you have problems the morning of surgery:  [phone]   Remember:  Do not eat or drink after midnight.     Take these medicines the morning of surgery with A SIP OF WATER ----tylenol,all inhalers,norvasc,neurontin,synthroid,singular,protonix,flomax   Do not wear jewelry, make-up or nail polish.  Do not wear lotions, powders, or perfumes, or deodorant.  Do not shave 48 hours prior to surgery.  Men may shave face and neck.  Do not bring valuables to the hospital.  [facility] is not responsible for any belongings or valuables.  Contacts, dentures or bridgework may not be worn into surgery.  Leave your suitcase in the car.  After surgery it may be brought to your room.  For patients admitted to the hospital, discharge time will be determined by your treatment team.  Patients discharged the day of surgery will not be allowed to drive home.   Name and phone number of your driver:   Do not take any aspirin,anti-inflammatories,vitamins,or herbal supplements 5-7 days prior to surgery. Special instructions:  [facility] - Preparing for Surgery  Before surgery, you can play an important role.  Because skin is not sterile, your skin needs to be as free of germs as possible.  You can reduce the number of germs on you skin by washing with CHG  (chlorahexidine gluconate) soap before surgery.  CHG is an antiseptic cleaner which kills germs and bonds with the skin to continue killing germs even after washing.  Oral Hygiene is also important in reducing the risk of infection.  Remember to brush your teeth with your regular toothpaste the morning of surgery.  Please DO NOT use if you have an allergy to CHG or antibacterial soaps.  If your skin becomes reddened/irritated stop using the CHG and inform your nurse when you arrive at Short Stay.  Do not shave (including legs and underarms) for at least 48 hours prior to the first CHG shower.  You may shave your face.  Please follow these instructions carefully:   1.  Shower with CHG Soap the night before surgery and the morning of Surgery.  2.  If you choose to wash your hair, wash your hair first as usual with your normal shampoo.  3.  After you shampoo, rinse your hair and body thoroughly to remove the shampoo. 4.  Use CHG as you would any other liquid soap.  You can apply chg directly to the skin and wash gently with a      scrungie or washcloth.           5.  Apply the CHG Soap to your body ONLY FROM THE NECK DOWN.   Do not use on open wounds or open sores. Avoid contact with your eyes, ears, mouth and genitals (private parts).  Wash genitals (private parts)  with your normal soap.  6.  Wash thoroughly, paying special attention to the area where your surgery will be performed.  7.  Thoroughly rinse your body with warm water from the neck down.  8.  DO NOT shower/wash with your normal soap after using and rinsing off the CHG Soap.  9.  Pat yourself dry with a clean towel.            10.  Wear clean pajamas.            11.  Place clean sheets on your bed the night of your first shower and do not sleep with pets.  Day of Surgery  Do not apply any lotions/deoderants the morning of surgery.   Please wear clean clothes to the hospital/surgery center. Remember to brush your teeth with  toothpaste.    Please read over the following fact sheets that you were given. MRSA Information

## 2018-11-12 ENCOUNTER — Encounter (HOSPITAL_COMMUNITY)
Admission: RE | Admit: 2018-11-12 | Discharge: 2018-11-12 | Disposition: A | Discharge status: Home / Self Care | Payer: Medicare HMO | Source: Ambulatory Visit | Attending: Orthopedic Surgery | Admitting: Orthopedic Surgery

## 2018-11-12 ENCOUNTER — Other Ambulatory Visit: Payer: Self-pay | Admitting: Family

## 2018-11-12 ENCOUNTER — Encounter (HOSPITAL_COMMUNITY): Payer: Self-pay

## 2018-11-12 ENCOUNTER — Other Ambulatory Visit: Payer: Self-pay

## 2018-11-12 DIAGNOSIS — Z01812 Encounter for preprocedural laboratory examination: Secondary | ICD-10-CM | POA: Diagnosis present

## 2018-11-12 DIAGNOSIS — J45909 Unspecified asthma, uncomplicated: Secondary | ICD-10-CM

## 2018-11-12 LAB — CBC WITH DIFFERENTIAL/PLATELET
ABS IMMATURE GRANULOCYTES: 0.03 10*3/uL (ref 0.00–0.07)
BASOS PCT: 0 %
Basophils Absolute: 0 10*3/uL (ref 0.0–0.1)
EOS PCT: 3 %
Eosinophils Absolute: 0.2 10*3/uL (ref 0.0–0.5)
HCT: 46.4 % (ref 39.0–52.0)
Hemoglobin: 14.9 g/dL (ref 13.0–17.0)
Immature Granulocytes: 0 %
Lymphocytes Relative: 21 %
Lymphs Abs: 1.5 10*3/uL (ref 0.7–4.0)
MCH: 29.2 pg (ref 26.0–34.0)
MCHC: 32.1 g/dL (ref 30.0–36.0)
MCV: 90.8 fL (ref 80.0–100.0)
MONO ABS: 0.7 10*3/uL (ref 0.1–1.0)
Monocytes Relative: 9 %
NEUTROS ABS: 4.9 10*3/uL (ref 1.7–7.7)
Neutrophils Relative %: 67 %
PLATELETS: 192 10*3/uL (ref 150–400)
RBC: 5.11 MIL/uL (ref 4.22–5.81)
RDW: 13.2 % (ref 11.5–15.5)
WBC: 7.3 10*3/uL (ref 4.0–10.5)
nRBC: 0 % (ref 0.0–0.2)

## 2018-11-12 LAB — URINALYSIS, ROUTINE W REFLEX MICROSCOPIC
BILIRUBIN URINE: NEGATIVE
GLUCOSE, UA: NEGATIVE mg/dL
Hgb urine dipstick: NEGATIVE
KETONES UR: NEGATIVE mg/dL
Leukocytes, UA: NEGATIVE
Nitrite: NEGATIVE
PROTEIN: NEGATIVE mg/dL
Specific Gravity, Urine: 1.004 — ABNORMAL LOW (ref 1.005–1.030)
pH: 7 (ref 5.0–8.0)

## 2018-11-12 LAB — TYPE AND SCREEN
ABO/RH(D): O POS
Antibody Screen: NEGATIVE

## 2018-11-12 LAB — COMPREHENSIVE METABOLIC PANEL
ALK PHOS: 59 U/L (ref 38–126)
ALT: 19 U/L (ref 0–44)
AST: 16 U/L (ref 15–41)
Albumin: 4 g/dL (ref 3.5–5.0)
Anion gap: 6 (ref 5–15)
BUN: 7 mg/dL (ref 6–20)
CALCIUM: 9 mg/dL (ref 8.9–10.3)
CHLORIDE: 108 mmol/L (ref 98–111)
CO2: 25 mmol/L (ref 22–32)
CREATININE: 0.7 mg/dL (ref 0.61–1.24)
Glucose, Bld: 92 mg/dL (ref 70–99)
Potassium: 3.7 mmol/L (ref 3.5–5.1)
SODIUM: 139 mmol/L (ref 135–145)
Total Bilirubin: 0.6 mg/dL (ref 0.3–1.2)
Total Protein: 6.4 g/dL — ABNORMAL LOW (ref 6.5–8.1)

## 2018-11-12 LAB — APTT: APTT: 31 s (ref 24–36)

## 2018-11-12 LAB — SURGICAL PCR SCREEN
MRSA, PCR: NEGATIVE
STAPHYLOCOCCUS AUREUS: NEGATIVE

## 2018-11-12 LAB — ABO/RH: ABO/RH(D): O POS

## 2018-11-12 LAB — PROTIME-INR
INR: 0.98
PROTHROMBIN TIME: 12.9 s (ref 11.4–15.2)

## 2018-11-12 NOTE — Progress Notes (Addendum)
PCP is Dr. Lemar Livings  LOV 09/2018 Nephro is Dr. Carolynne Edouard  LOV  02/2018  (polycystic kidney disease) Denies cp, sob, murmur H/o stroke in 2004, with some residual weakness. Patient states at PAT, that it is NOT his right knee they are working on, but the LEFT.   I have placed a call to Dr. Berenice Primas office. Permit to be signed DOS after clarification.

## 2018-11-12 NOTE — Pre-Procedure Instructions (Signed)
Nyra Jabs  11/12/2018      [facility] - [place], [place] - [address] [place] [address] Phone: [phone] Fax: [place], [place] [address] Phone: [phone] Fax: [facility] #[phone] - [place], [place] - [address] AT [place] [address] [place] [address] Phone: [phone] Fax: [phone]    Your procedure is scheduled on Monday, Nov. 25th   Report to [facility] Admitting at 8 A.M.             (posted surgery time 10:05a - 12:01p)   Call this number if you have problems the morning of surgery:  [phone]   Remember:   Do not eat any foods or drink any liquids after midnight, Sunday.              5 days prior to surgery, STOP TAKING ANY Vitamins, Herbal Supplements, Anti-inflammatories, Blood Thinners     Take these medicines the morning of surgery with A SIP OF WATER :   Norvasc, Neurontin, Levothyroxine, Pantoprazole, Tamsulosin.  Please use your inhalers.   Do not wear jewelry - no rings or watches.  Do not wear lotions, colognes or deodorant.             Men may shave face and neck.  Do not bring valuables to the hospital.  [facility] is not responsible for any belongings or valuables.  Contacts, dentures or bridgework may not be worn into surgery.  Leave your suitcase in the car.  After surgery it may be brought to your room.  For patients admitted to the hospital, discharge time will be determined by your treatment team.           [facility] - Preparing for Surgery  Before surgery, you can play an important role.  Because skin is not sterile, your skin needs to be as free of germs as possible.  You can reduce the number of germs on you skin by washing with CHG (chlorahexidine gluconate) soap before surgery.   CHG is an antiseptic cleaner which kills germs and bonds with the skin to continue killing germs even after washing.    Oral Hygiene is also important in reducing the risk of infection.  Remember to brush your teeth with your regular toothpaste the morning of surgery.  Please DO NOT use if you have an allergy to CHG or antibacterial soaps.  If your skin becomes reddened/irritated stop using the CHG and inform your nurse when you arrive at Short Stay.  Do not shave (including legs and underarms) for at least 48 hours prior to the first CHG shower.  You may shave your face.  Please follow these instructions carefully:   1.  Shower with CHG Soap the night before surgery and the morning of Surgery.   2.  If you choose to wash your hair, wash your hair first as usual with your normal shampoo.   3.  After you shampoo, rinse your hair and body thoroughly to remove the shampoo.  4.  Use CHG as you would any other liquid soap.  You can apply chg directly to the skin and wash gently with a  scrungie or washcloth     5.  Apply the CHG Soap to your body  ONLY FROM THE NECK DOWN.   Do not use on open wounds or open sores. Avoid contact with your eyes, ears, mouth and genitals (private parts).  Wash genitals (private parts) with your normal soap.   6.  Wash thoroughly, paying special attention to the area where your surgery will be performed.   7.  Thoroughly rinse your body with warm water from the neck down.   8.  DO NOT shower/wash with your normal soap after using and rinsing off the CHG Soap.  9.  Pat yourself dry with a clean towel.             10.  Wear clean pajamas.             11.  Place clean sheets on your bed the night of your first shower and do not sleep with pets.  Day of Surgery  Do not apply any lotions/deoderants the morning of surgery.   Please wear clean clothes to the hospital/surgery center. Remember to brush your teeth with toothpaste.  Please read over the following  fact sheets that you were given. MRSA Information and Surgical Site Infection Prevention

## 2018-11-13 DIAGNOSIS — Z79891 Long term (current) use of opiate analgesic: Secondary | ICD-10-CM | POA: Diagnosis not present

## 2018-11-13 DIAGNOSIS — Z79899 Other long term (current) drug therapy: Secondary | ICD-10-CM | POA: Diagnosis not present

## 2018-11-13 DIAGNOSIS — G894 Chronic pain syndrome: Secondary | ICD-10-CM | POA: Diagnosis not present

## 2018-11-13 MED ORDER — BUPIVACAINE LIPOSOME 1.3 % IJ SUSP: 20.0000 mL | Freq: Once | INTRAMUSCULAR | Status: DC

## 2018-11-14 ENCOUNTER — Other Ambulatory Visit: Payer: Self-pay | Admitting: Orthopedic Surgery

## 2018-11-14 NOTE — Care Plan (Signed)
Spoke with patient prior to surgery. Will discharge to home with family. Has equipment except CPM and this has been ordered.   Kyle Blair, Kyle Blair

## 2018-11-16 MED ORDER — BUPIVACAINE LIPOSOME 1.3 % IJ SUSP
20.0000 mL | INTRAMUSCULAR | Status: DC
  Filled 2018-11-16: qty 20

## 2018-11-16 MED ORDER — TRANEXAMIC ACID-NACL 1000-0.7 MG/100ML-% IV SOLN
1000.0000 mg | INTRAVENOUS | Status: AC
  Administered 2018-11-19: 1000 mg via INTRAVENOUS
  Filled 2018-11-16: qty 100

## 2018-11-18 NOTE — H&P (Signed)
TOTAL KNEE ADMISSION H&P  Patient is being admitted for left total knee arthroplasty.  Subjective:  Chief Complaint:left knee pain.  HPI: Kyle Blair, 57 y.o. male, has a history of pain and functional disability in the left knee due to arthritis and has failed non-surgical conservative treatments for greater than 12 weeks to includeNSAID's and/or analgesics, corticosteriod injections, viscosupplementation injections and activity modification.  Onset of symptoms was gradual, starting 5 years ago with gradually worsening course since that time. The patient noted no past surgery on the left knee(s).  Patient currently rates pain in the left knee(s) at 9 out of 10 with activity. Patient has night pain, worsening of pain with activity and weight bearing, pain that interferes with activities of daily living, pain with passive range of motion and joint swelling.  Patient has evidence of subchondral sclerosis and joint space narrowing by imaging studies. This patient has had failure of all reasonable conservative care. There is no active infection.  Patient Active Problem List   Diagnosis Date Noted  . OSA (obstructive sleep apnea) 06/02/2018  . 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 Ingrown toenail 05/08/2018  . Onychomycosis 05/08/2018  . 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 Acute metabolic encephalopathy 40/97/3532  . Tobacco abuse 01/03/2018  . Acute respiratory failure ([place]) 01/19/2017  . Community acquired pneumonia 01/18/2017  . Influenza with pneumonia 01/18/2017  . Indeterminate pulmonary nodules 07/23/2015  . Obese 05/20/2015  . Undiagnosed cardiac murmurs 04/15/2015  . Asthma with acute exacerbation 03/13/2014  . Hx of adenomatous colonic polyps 05/08/2013  . Barrett's esophagus 05/03/2013  . Chronic diarrhea 04/02/2013  . HTN (hypertension) 04/02/2013  . Hx of low back pain 09/26/2012  . Arthralgia of right knee 05/02/2012  . Asthma 12/13/2011  . Benign prostatic  hyperplasia 12/13/2011  . Hyperlipidemia 12/13/2011  . GERD (gastroesophageal reflux disease) 12/13/2011  . Hypothyroid   . Polycystic kidney disease    Past Medical History:  Diagnosis Date  . Arthritis    severe; pt takes strong pain meds daily/legs  . Asthma    MONTH AGO  . BPH (benign prostatic hypertrophy)   . Diverticulosis   . GERD with stricture   . Hiatal hernia   . History of stroke 2004  . Hyperlipidemia   . Hypertension   . Hypothyroid   . Lung nodule 02/03/2012  . Polycystic kidney disease   . Stroke ([place])    2004   EQULIBRIUM, AND JUDGING DISTANCE  . Tubular adenoma of colon     Past Surgical History:  Procedure Laterality Date  . CARPAL TUNNEL RELEASE    . COLONOSCOPY    . HERNIA REPAIR  2005   with mesh  . KNEE ARTHROSCOPY Bilateral 12/05/13   cyst and bone spur removed from left knee per pt.  Marland Kitchen KNEE ARTHROSCOPY Left 01/2015  . NASAL POLYP EXCISION  1992 or 93  . POLYPECTOMY    . TOTAL KNEE ARTHROPLASTY Right 05/19/2015   Procedure: RIGHT TOTAL KNEE ARTHROPLASTY;  Surgeon: Paralee Cancel, MD;  Location: [facility];  Service: Orthopedics;  Laterality: Right;    Current Facility-Administered Medications  Medication Dose Route Frequency Provider Last Rate Last Dose  . [START ON 11/19/2018] bupivacaine liposome (EXPAREL) 1.3 % injection 266 mg  20 mL Infiltration To OR Dorna Leitz, MD      . Derrill Memo ON 11/19/2018] tranexamic acid (CYKLOKAPRON) IVPB 1,000 mg  1,000 mg Intravenous To OR Dorna Leitz, MD       Current Outpatient Medications  Medication  Sig Dispense Refill Last Dose  . acetaminophen (TYLENOL) 325 MG tablet Take 1-2 tablets (325-650 mg total) by mouth every 6 (six) hours as needed for mild pain or moderate pain. 30 tablet 0 Taking  . albuterol (PROVENTIL) (2.5 MG/3ML) 0.083% nebulizer solution Take 3 mLs (2.5 mg total) by nebulization every 6 (six) hours as needed for wheezing or shortness of breath. 75 mL 12 Taking  . amLODipine (NORVASC) 10 MG tablet  Take 1 tablet (10 mg total) by mouth daily. 30 tablet 3   . benazepril (LOTENSIN) 10 MG tablet TAKE 1 TABLET(10 MG) BY MOUTH DAILY (Patient taking differently: Take 10 mg by mouth daily. TAKE 1 TABLET(10 MG) BY MOUTH DAILY) 30 tablet 5 Taking  . Dextromethorphan-guaiFENesin (TUSSIN DM COUGH + CHEST) 10-200 MG/5ML LIQD Take 10 mLs by mouth at bedtime.     Marland Kitchen EVZIO 2 MG/0.4ML SOAJ Inject 2 mg into the muscle.   0 Taking  . fluticasone (FLOVENT HFA) 110 MCG/ACT inhaler 2 puffs twice daily when your asthma is not well controlled. (Patient taking differently: Inhale 2 puffs into the lungs daily as needed (shortness of breath). 2 puffs twice daily when your asthma is not well controlled.) 1 Inhaler 1 Taking  . furosemide (LASIX) 20 MG tablet Take 1 tablet (20 mg total) by mouth daily as needed. (Patient taking differently: Take 20 mg by mouth daily as needed for fluid. ) 30 tablet 5 Taking  . gabapentin (NEURONTIN) 300 MG capsule Take 300 mg by mouth 3 (three) times daily.   0 Taking  . levothyroxine (SYNTHROID, LEVOTHROID) 200 MCG tablet TAKE ONE (1) TABLET BY MOUTH EACH DAY BEFORE BREAKFAST (Patient taking differently: Take 200 mcg by mouth daily before breakfast. ) 90 tablet 1   . lovastatin (MEVACOR) 40 MG tablet TAKE ONE (1) TABLET BY MOUTH EACH DAY ATBEDTIME (Patient taking differently: Take 40 mg by mouth daily. ) 90 tablet 1 Taking  . methocarbamol (ROBAXIN) 500 MG tablet Take 1 tablet (500 mg total) by mouth every 6 (six) hours as needed for muscle spasms. (Patient taking differently: Take 500 mg by mouth 2 (two) times daily. ) 50 tablet 0 Taking  . montelukast (SINGULAIR) 10 MG tablet TAKE ONE TABLET EVERY MORNING (Patient taking differently: Take 10 mg by mouth daily. TAKE ONE TABLET EVERY MORNING) 90 tablet 1 Taking  . oxyCODONE-acetaminophen (PERCOCET) 7.5-325 MG tablet Take 1 tablet by mouth every 8 (eight) hours as needed for moderate pain.    Taking  . pantoprazole (PROTONIX) 40 MG tablet TAKE  ONE (1) TABLET BY MOUTH EVERY DAY BEFORE BREAKFAST ON AN EMPTY STOMACH (Patient taking differently: Take 40 mg by mouth daily. ) 90 tablet 3   . tamsulosin (FLOMAX) 0.4 MG CAPS capsule TAKE ONE (1) CAPSULE EACH DAY BY MOUTH (Patient taking differently: Take 0.4 mg by mouth daily. ) 90 capsule 1   . VENTOLIN HFA 108 (90 Base) MCG/ACT inhaler INHALE 2 PUFFS INTO THE LUNGS EVERY 4 HOURS AS NEEDED FOR WHEEZING ORSHORTNESS OF BREATH (Patient taking differently: Inhale 1 puff into the lungs 2 (two) times daily. ) 18 g 5 Taking  . XTAMPZA ER 18 MG C12A Take 18 mg by mouth 2 (two) times daily.    Taking  . ADVAIR DISKUS 250-50 MCG/DOSE AEPB USE ONE INHALATION TWICE A DAY. 60 each 5   . amoxicillin-clavulanate (AUGMENTIN) 875-125 MG tablet Take 1 tablet by mouth 2 (two) times daily. (Patient not taking: Reported on 11/09/2018) 20 tablet 0 Not Taking at  Unknown time  . azithromycin (ZITHROMAX) 250 MG tablet 2 tabs by mouth today, then one tab once daily for 4 more days (Patient not taking: Reported on 11/09/2018) 6 tablet 0 Not Taking at Unknown time  . nystatin (MYCOSTATIN) 100000 UNIT/ML suspension Take 5 mLs (500,000 Units total) by mouth 4 (four) times daily. (Patient not taking: Reported on 11/09/2018) 200 mL 0 Not Taking at Unknown time  . predniSONE (DELTASONE) 10 MG tablet 4 tabs by mouth once daily for 3 days, then 3 tabs daily x 3 days, then 2 tabs daily x 3 days, then 1 tab daily x 3 days (Patient not taking: Reported on 11/09/2018) 30 tablet 0 Not Taking at Unknown time  . ranitidine (ZANTAC) 150 MG capsule Take 1 capsule (150 mg total) by mouth 2 (two) times daily. (Patient not taking: Reported on 11/09/2018) 60 capsule 4 Not Taking at Unknown time  . varenicline (CHANTIX CONTINUING MONTH PAK) 1 MG tablet Take 1 tablet (1 mg total) by mouth 2 (two) times daily. (Patient not taking: Reported on 11/09/2018) 60 tablet 1 Not Taking at Unknown time   Facility-Administered Medications Ordered in Other  Encounters  Medication Dose Route Frequency Provider Last Rate Last Dose  . bupivacaine liposome (EXPAREL) 1.3 % injection 266 mg  20 mL Other Once Dorna Leitz, MD       Allergies  Allergen Reactions  . Aspirin Anaphylaxis  . Doxycycline Hyclate Swelling    Facial Swelling  . Ibuprofen Anaphylaxis  . Jynarque [Tolvaptan] Anaphylaxis  . Peanut Butter Flavor Anaphylaxis  . Cephalexin Rash    Social History   Tobacco Use  . Smoking status: Current Every Day Smoker    Packs/day: 0.50    Years: 20.00    Pack years: 10.00    Types: Cigarettes  . Smokeless tobacco: Never Used  . Tobacco comment: 5-6 cigarettes a day  Substance Use Topics  . Alcohol use: No    Alcohol/week: 0.0 standard drinks    Family History  Problem Relation Age of Onset  . Heart disease Mother        pacemaker  . Diabetes Father   . Kidney disease Father   . Diabetes Sister   . Kidney disease Sister   . Colon cancer Neg Hx   . Esophageal cancer Neg Hx   . Rectal cancer Neg Hx   . Stomach cancer Neg Hx   . Prostate cancer Neg Hx   . Pancreatic cancer Neg Hx      ROS .ROS: I have reviewed the patient's review of systems thoroughly and there are no positive responses as relates to the HPI. Objective:  Physical Exam  Vital signs in last 24 hours:   Well-developed well-nourished patient in no acute distress. Alert and oriented x3 HEENT:within normal limits Cardiac: Regular rate and rhythm Pulmonary: Lungs clear to auscultation Abdomen: Soft and nontender.  Normal active bowel sounds  Musculoskeletal: (left knee: Limited range of motion.  Painful range of motion.  No instability.  Trace effusion.  Neurovascular intact distally. Labs:  Recent Results (from the past 2160 hour(s))  Type and screen Order type and screen if day of surgery is less than 15 days from draw of preadmission visit or order morning of surgery if day of surgery is greater than 6 days from preadmission visit.     Status: None    Collection Time: 11/12/18  1:33 PM  Result Value Ref Range   ABO/RH(D) O POS    Antibody Screen NEG  Sample Expiration 11/26/2018    Extend sample reason      NO TRANSFUSIONS OR PREGNANCY IN THE PAST 3 MONTHS Performed at [facility], [place] [address] [place], [place] [address]   Surgical pcr screen     Status: None   Collection Time: 11/12/18  1:34 PM  Result Value Ref Range   MRSA, PCR NEGATIVE NEGATIVE   Staphylococcus aureus NEGATIVE NEGATIVE    Comment: (NOTE) The Xpert SA Assay (FDA approved for NASAL specimens in patients 70 years of age and older), is one component of a comprehensive surveillance program. It is not intended to diagnose infection nor to guide or monitor treatment. Performed at [facility], [place] [address] [place], [place] [address]   APTT     Status: None   Collection Time: 11/12/18  1:34 PM  Result Value Ref Range   aPTT 31 24 - 36 seconds    Comment: Performed at [facility] [address] [place], [place] [address]  CBC WITH DIFFERENTIAL     Status: None   Collection Time: 11/12/18  1:34 PM  Result Value Ref Range   WBC 7.3 4.0 - 10.5 K/uL   RBC 5.11 4.22 - 5.81 MIL/uL   Hemoglobin 14.9 13.0 - 17.0 g/dL   HCT 46.4 39.0 - 52.0 %   MCV 90.8 80.0 - 100.0 fL   MCH 29.2 26.0 - 34.0 pg   MCHC 32.1 30.0 - 36.0 g/dL   RDW 13.2 11.5 - 15.5 %   Platelets 192 150 - 400 K/uL   nRBC 0.0 0.0 - 0.2 %   Neutrophils Relative % 67 %   Neutro Abs 4.9 1.7 - 7.7 K/uL   Lymphocytes Relative 21 %   Lymphs Abs 1.5 0.7 - 4.0 K/uL   Monocytes Relative 9 %   Monocytes Absolute 0.7 0.1 - 1.0 K/uL   Eosinophils Relative 3 %   Eosinophils Absolute 0.2 0.0 - 0.5 K/uL   Basophils Relative 0 %   Basophils Absolute 0.0 0.0 - 0.1 K/uL   Immature Granulocytes 0 %   Abs Immature Granulocytes 0.03 0.00 - 0.07 K/uL    Comment: Performed at [facility], [address], [place], [place] [address]  Comprehensive metabolic panel     Status:  Abnormal   Collection Time: 11/12/18  1:34 PM  Result Value Ref Range   Sodium 139 135 - 145 mmol/L   Potassium 3.7 3.5 - 5.1 mmol/L   Chloride 108 98 - 111 mmol/L   CO2 25 22 - 32 mmol/L   Glucose, Bld 92 70 - 99 mg/dL   BUN 7 6 - 20 mg/dL   Creatinine, Ser 0.70 0.61 - 1.24 mg/dL   Calcium 9.0 8.9 - 10.3 mg/dL   Total Protein 6.4 (L) 6.5 - 8.1 g/dL   Albumin 4.0 3.5 - 5.0 g/dL   AST 16 15 - 41 U/L   ALT 19 0 - 44 U/L   Alkaline Phosphatase 59 38 - 126 U/L   Total Bilirubin 0.6 0.3 - 1.2 mg/dL   GFR calc non Af Amer >60 >60 mL/min   GFR calc Af Amer >60 >60 mL/min    Comment: (NOTE) The eGFR has been calculated using the CKD EPI equation. This calculation has not been validated in all clinical situations. eGFR's persistently <60 mL/min signify possible Chronic Kidney Disease.    Anion gap 6 5 - 15    Comment: Performed at [facility], [address], [place], [place] [address]  Protime-INR     Status: None   Collection Time: 11/12/18  1:34 PM  Result Value Ref Range   Prothrombin Time 12.9 11.4 - 15.2 seconds   INR 0.98     Comment: Performed at [facility] [address] [place], [place] [address]  Urinalysis, Routine w reflex microscopic     Status: Abnormal   Collection Time: 11/12/18  1:34 PM  Result Value Ref Range   Color, Urine STRAW (A) YELLOW   APPearance CLEAR CLEAR   Specific Gravity, Urine 1.004 (L) 1.005 - 1.030   pH 7.0 5.0 - 8.0   Glucose, UA NEGATIVE NEGATIVE mg/dL   Hgb urine dipstick NEGATIVE NEGATIVE   Bilirubin Urine NEGATIVE NEGATIVE   Ketones, ur NEGATIVE NEGATIVE mg/dL   Protein, ur NEGATIVE NEGATIVE mg/dL   Nitrite NEGATIVE NEGATIVE   Leukocytes, UA NEGATIVE NEGATIVE    Comment: Performed at [facility] [address], [place], [place] [address]  ABO/Rh     Status: None   Collection Time: 11/12/18  1:54 PM  Result Value Ref Range   ABO/RH(D)      O POS Performed at [facility] [address]  [place], [place] [address]    Estimated body mass index is 34.93 kg/m as calculated from the following:   Height as of 11/12/18: 5' 7" (1.702 m).   Weight as of 11/12/18: 101.2 kg.   Imaging Review Plain radiographs demonstrate severe degenerative joint disease of the left knee(s). The overall alignment ismild varus. The bone quality appears to be fair for age and reported activity level.   Preoperative templating of the joint replacement has been completed, documented, and submitted to the Operating Room personnel in order to optimize intra-operative equipment management.   Anticipated LOS equal to or greater than 2 midnights due to - Age 31 and older with one or more of the following:  - Obesity  - Expected need for hospital services (PT, OT, Nursing) required for safe  discharge  - Anticipated need for postoperative skilled nursing care or inpatient rehab  - Active co-morbidities: Chronic pain requiring opiods OR   - Unanticipated findings during/Post Surgery: use of chronic opioids long-standing preoperative  - Patient is a high risk of re-admission due to: Barriers to post-acute care (logistical, no family support in home)     Assessment/Plan:  End stage arthritis, left knee   The patient history, physical examination, clinical judgment of the provider and imaging studies are consistent with end stage degenerative joint disease of the left knee(s) and total knee arthroplasty is deemed medically necessary. The treatment options including medical management, injection therapy arthroscopy and arthroplasty were discussed at length. The risks and benefits of total knee arthroplasty were presented and reviewed. The risks due to aseptic loosening, infection, stiffness, patella tracking problems, thromboembolic complications and other imponderables were discussed. The patient acknowledged the explanation, agreed to proceed with the plan and consent was signed. Patient is being admitted for  inpatient treatment for surgery, pain control, PT, OT, prophylactic antibiotics, VTE prophylaxis, progressive ambulation and ADL's and discharge planning. The patient is planning to be discharged home with home health services

## 2018-11-19 ENCOUNTER — Ambulatory Visit (HOSPITAL_COMMUNITY): Payer: MEDICARE | Admitting: Certified Registered Nurse Anesthetist

## 2018-11-19 ENCOUNTER — Encounter (HOSPITAL_COMMUNITY): Payer: Self-pay | Admitting: Certified Registered Nurse Anesthetist

## 2018-11-19 ENCOUNTER — Telehealth: Payer: Self-pay | Admitting: *Deleted

## 2018-11-19 ENCOUNTER — Inpatient Hospital Stay (HOSPITAL_COMMUNITY)
Admission: RE | Admit: 2018-11-19 | Discharge: 2018-11-20 | DRG: 470 | Disposition: A | Discharge status: Home / Self Care | Payer: MEDICARE | Attending: Orthopedic Surgery | Admitting: Orthopedic Surgery

## 2018-11-19 ENCOUNTER — Other Ambulatory Visit: Payer: Self-pay

## 2018-11-19 ENCOUNTER — Encounter (HOSPITAL_COMMUNITY)
Admission: RE | Disposition: A | Discharge status: Home / Self Care | Payer: Self-pay | Source: Home / Self Care | Attending: Orthopedic Surgery

## 2018-11-19 DIAGNOSIS — G4733 Obstructive sleep apnea (adult) (pediatric): Secondary | ICD-10-CM | POA: Diagnosis present

## 2018-11-19 DIAGNOSIS — E039 Hypothyroidism, unspecified: Secondary | ICD-10-CM | POA: Diagnosis present

## 2018-11-19 DIAGNOSIS — Z881 Allergy status to other antibiotic agents status: Secondary | ICD-10-CM | POA: Diagnosis not present

## 2018-11-19 DIAGNOSIS — Z148 Genetic carrier of other disease: Secondary | ICD-10-CM | POA: Diagnosis not present

## 2018-11-19 DIAGNOSIS — Z841 Family history of disorders of kidney and ureter: Secondary | ICD-10-CM

## 2018-11-19 DIAGNOSIS — G8918 Other acute postprocedural pain: Secondary | ICD-10-CM | POA: Diagnosis not present

## 2018-11-19 DIAGNOSIS — Z96651 Presence of right artificial knee joint: Secondary | ICD-10-CM | POA: Diagnosis not present

## 2018-11-19 DIAGNOSIS — Z8673 Personal history of transient ischemic attack (TIA), and cerebral infarction without residual deficits: Secondary | ICD-10-CM | POA: Diagnosis not present

## 2018-11-19 DIAGNOSIS — Z79890 Hormone replacement therapy (postmenopausal): Secondary | ICD-10-CM

## 2018-11-19 DIAGNOSIS — K219 Gastro-esophageal reflux disease without esophagitis: Secondary | ICD-10-CM | POA: Diagnosis present

## 2018-11-19 DIAGNOSIS — Z79899 Other long term (current) drug therapy: Secondary | ICD-10-CM

## 2018-11-19 DIAGNOSIS — Z886 Allergy status to analgesic agent status: Secondary | ICD-10-CM

## 2018-11-19 DIAGNOSIS — E785 Hyperlipidemia, unspecified: Secondary | ICD-10-CM | POA: Diagnosis not present

## 2018-11-19 DIAGNOSIS — Z91010 Allergy to peanuts: Secondary | ICD-10-CM

## 2018-11-19 DIAGNOSIS — Z888 Allergy status to other drugs, medicaments and biological substances status: Secondary | ICD-10-CM

## 2018-11-19 DIAGNOSIS — M1712 Unilateral primary osteoarthritis, left knee: Secondary | ICD-10-CM | POA: Diagnosis present

## 2018-11-19 DIAGNOSIS — F17210 Nicotine dependence, cigarettes, uncomplicated: Secondary | ICD-10-CM | POA: Diagnosis present

## 2018-11-19 DIAGNOSIS — Q613 Polycystic kidney, unspecified: Secondary | ICD-10-CM | POA: Diagnosis not present

## 2018-11-19 DIAGNOSIS — K449 Diaphragmatic hernia without obstruction or gangrene: Secondary | ICD-10-CM | POA: Diagnosis present

## 2018-11-19 DIAGNOSIS — N400 Enlarged prostate without lower urinary tract symptoms: Secondary | ICD-10-CM | POA: Diagnosis not present

## 2018-11-19 DIAGNOSIS — E669 Obesity, unspecified: Secondary | ICD-10-CM | POA: Diagnosis not present

## 2018-11-19 DIAGNOSIS — I10 Essential (primary) hypertension: Secondary | ICD-10-CM | POA: Diagnosis not present

## 2018-11-19 DIAGNOSIS — Z833 Family history of diabetes mellitus: Secondary | ICD-10-CM

## 2018-11-19 DIAGNOSIS — Z6834 Body mass index (BMI) 34.0-34.9, adult: Secondary | ICD-10-CM

## 2018-11-19 DIAGNOSIS — Z8249 Family history of ischemic heart disease and other diseases of the circulatory system: Secondary | ICD-10-CM

## 2018-11-19 DIAGNOSIS — Z884 Allergy status to anesthetic agent status: Secondary | ICD-10-CM

## 2018-11-19 DIAGNOSIS — Z7951 Long term (current) use of inhaled steroids: Secondary | ICD-10-CM

## 2018-11-19 HISTORY — DX: Unilateral primary osteoarthritis, left knee: M17.12

## 2018-11-19 HISTORY — PX: TOTAL KNEE ARTHROPLASTY: SHX125

## 2018-11-19 SURGERY — ARTHROPLASTY, KNEE, TOTAL
Anesthesia: Regional | Laterality: Left

## 2018-11-19 MED ORDER — ONDANSETRON HCL 4 MG/2ML IJ SOLN
INTRAMUSCULAR | Status: DC | PRN
  Administered 2018-11-19: 4 mg via INTRAVENOUS

## 2018-11-19 MED ORDER — BUPIVACAINE HCL (PF) 0.5 % IJ SOLN
INTRAMUSCULAR | Status: DC | PRN
  Administered 2018-11-19: 30 mL

## 2018-11-19 MED ORDER — PROPOFOL 10 MG/ML IV BOLUS
INTRAVENOUS | Status: DC | PRN
  Administered 2018-11-19 (×2): 20 mg via INTRAVENOUS

## 2018-11-19 MED ORDER — CEFAZOLIN SODIUM-DEXTROSE 2-4 GM/100ML-% IV SOLN: 2.0000 g | Freq: Four times a day (QID) | INTRAVENOUS | Status: DC

## 2018-11-19 MED ORDER — TRANEXAMIC ACID-NACL 1000-0.7 MG/100ML-% IV SOLN
1000.0000 mg | Freq: Once | INTRAVENOUS | Status: AC
  Administered 2018-11-19: 1000 mg via INTRAVENOUS
  Filled 2018-11-19: qty 100

## 2018-11-19 MED ORDER — TAMSULOSIN HCL 0.4 MG PO CAPS
0.4000 mg | ORAL_CAPSULE | Freq: Every day | ORAL | Status: DC
  Administered 2018-11-20: 0.4 mg via ORAL
  Filled 2018-11-19: qty 1

## 2018-11-19 MED ORDER — ALBUTEROL SULFATE (2.5 MG/3ML) 0.083% IN NEBU: 2.5000 mg | INHALATION_SOLUTION | Freq: Four times a day (QID) | RESPIRATORY_TRACT | Status: DC | PRN

## 2018-11-19 MED ORDER — CHLORHEXIDINE GLUCONATE 4 % EX LIQD: 60.0000 mL | Freq: Once | CUTANEOUS | Status: DC

## 2018-11-19 MED ORDER — BUPIVACAINE LIPOSOME 1.3 % IJ SUSP
INTRAMUSCULAR | Status: DC | PRN
  Administered 2018-11-19: 20 mL

## 2018-11-19 MED ORDER — MIDAZOLAM HCL 2 MG/2ML IJ SOLN
INTRAMUSCULAR | Status: AC
  Administered 2018-11-19: 2 mg via INTRAVENOUS
  Filled 2018-11-19: qty 2

## 2018-11-19 MED ORDER — DOCUSATE SODIUM 100 MG PO CAPS
100.0000 mg | ORAL_CAPSULE | Freq: Two times a day (BID) | ORAL | Status: DC
  Administered 2018-11-19: 100 mg via ORAL
  Filled 2018-11-19 (×2): qty 1

## 2018-11-19 MED ORDER — CLINDAMYCIN PHOSPHATE 600 MG/50ML IV SOLN
600.0000 mg | Freq: Three times a day (TID) | INTRAVENOUS | Status: AC
  Administered 2018-11-19 (×2): 600 mg via INTRAVENOUS
  Filled 2018-11-19 (×2): qty 50

## 2018-11-19 MED ORDER — PHENYLEPHRINE 40 MCG/ML (10ML) SYRINGE FOR IV PUSH (FOR BLOOD PRESSURE SUPPORT)
PREFILLED_SYRINGE | INTRAVENOUS | Status: AC
  Filled 2018-11-19: qty 10

## 2018-11-19 MED ORDER — CLINDAMYCIN PHOSPHATE 900 MG/50ML IV SOLN
INTRAVENOUS | Status: AC
  Filled 2018-11-19: qty 50

## 2018-11-19 MED ORDER — ALUM & MAG HYDROXIDE-SIMETH 200-200-20 MG/5ML PO SUSP: 30.0000 mL | ORAL | Status: DC | PRN

## 2018-11-19 MED ORDER — OXYCODONE-ACETAMINOPHEN 7.5-325 MG PO TABS
1.0000 | ORAL_TABLET | Freq: Three times a day (TID) | ORAL | Status: DC | PRN
  Administered 2018-11-19 – 2018-11-20 (×2): 1 via ORAL
  Filled 2018-11-19 (×2): qty 1

## 2018-11-19 MED ORDER — PROMETHAZINE HCL 25 MG/ML IJ SOLN: 6.2500 mg | INTRAMUSCULAR | Status: DC | PRN

## 2018-11-19 MED ORDER — LACTATED RINGERS IV SOLN
INTRAVENOUS | Status: DC
  Administered 2018-11-19 (×2): via INTRAVENOUS

## 2018-11-19 MED ORDER — HYDROMORPHONE HCL 1 MG/ML IJ SOLN
0.5000 mg | INTRAMUSCULAR | Status: DC | PRN
  Administered 2018-11-19: 0.5 mg via INTRAVENOUS
  Administered 2018-11-19 – 2018-11-20 (×2): 1 mg via INTRAVENOUS
  Filled 2018-11-19 (×3): qty 1

## 2018-11-19 MED ORDER — PHENYLEPHRINE 40 MCG/ML (10ML) SYRINGE FOR IV PUSH (FOR BLOOD PRESSURE SUPPORT)
PREFILLED_SYRINGE | INTRAVENOUS | Status: DC | PRN
  Administered 2018-11-19: 80 ug via INTRAVENOUS
  Administered 2018-11-19 (×2): 40 ug via INTRAVENOUS
  Administered 2018-11-19: 80 ug via INTRAVENOUS
  Administered 2018-11-19: 40 ug via INTRAVENOUS
  Administered 2018-11-19: 80 ug via INTRAVENOUS
  Administered 2018-11-19: 40 ug via INTRAVENOUS

## 2018-11-19 MED ORDER — BENAZEPRIL HCL 5 MG PO TABS
10.0000 mg | ORAL_TABLET | Freq: Every day | ORAL | Status: DC
  Administered 2018-11-19 – 2018-11-20 (×2): 10 mg via ORAL
  Filled 2018-11-19 (×2): qty 2

## 2018-11-19 MED ORDER — ONDANSETRON HCL 4 MG/2ML IJ SOLN: 4.0000 mg | Freq: Four times a day (QID) | INTRAMUSCULAR | Status: DC | PRN

## 2018-11-19 MED ORDER — ROPIVACAINE HCL 7.5 MG/ML IJ SOLN
INTRAMUSCULAR | Status: DC | PRN
  Administered 2018-11-19: 30 mL via PERINEURAL

## 2018-11-19 MED ORDER — ACETAMINOPHEN 325 MG PO TABS
325.0000 mg | ORAL_TABLET | Freq: Four times a day (QID) | ORAL | Status: DC | PRN
  Administered 2018-11-20: 650 mg via ORAL
  Filled 2018-11-19: qty 2

## 2018-11-19 MED ORDER — PANTOPRAZOLE SODIUM 40 MG PO TBEC
40.0000 mg | DELAYED_RELEASE_TABLET | Freq: Every day | ORAL | Status: DC
  Administered 2018-11-20: 40 mg via ORAL
  Filled 2018-11-19: qty 1

## 2018-11-19 MED ORDER — PROPOFOL 10 MG/ML IV BOLUS
INTRAVENOUS | Status: AC
  Filled 2018-11-19: qty 20

## 2018-11-19 MED ORDER — MEPERIDINE HCL 50 MG/ML IJ SOLN: 6.2500 mg | INTRAMUSCULAR | Status: DC | PRN

## 2018-11-19 MED ORDER — EPHEDRINE 5 MG/ML INJ
INTRAVENOUS | Status: AC
  Filled 2018-11-19: qty 20

## 2018-11-19 MED ORDER — ALBUTEROL SULFATE HFA 108 (90 BASE) MCG/ACT IN AERS
INHALATION_SPRAY | RESPIRATORY_TRACT | Status: DC | PRN
  Administered 2018-11-19 (×2): 4 via RESPIRATORY_TRACT

## 2018-11-19 MED ORDER — ONDANSETRON HCL 4 MG/2ML IJ SOLN
INTRAMUSCULAR | Status: AC
  Filled 2018-11-19: qty 2

## 2018-11-19 MED ORDER — OXYCODONE ER 18 MG PO C12A: 18.0000 mg | EXTENDED_RELEASE_CAPSULE | Freq: Two times a day (BID) | ORAL | Status: DC

## 2018-11-19 MED ORDER — CLINDAMYCIN PHOSPHATE 900 MG/50ML IV SOLN
900.0000 mg | INTRAVENOUS | Status: AC
  Administered 2018-11-19: 900 mg via INTRAVENOUS

## 2018-11-19 MED ORDER — 0.9 % SODIUM CHLORIDE (POUR BTL) OPTIME
TOPICAL | Status: DC | PRN
  Administered 2018-11-19: 1000 mL

## 2018-11-19 MED ORDER — METHOCARBAMOL 1000 MG/10ML IJ SOLN
500.0000 mg | Freq: Four times a day (QID) | INTRAVENOUS | Status: DC | PRN
  Filled 2018-11-19: qty 5

## 2018-11-19 MED ORDER — DEXAMETHASONE SODIUM PHOSPHATE 10 MG/ML IJ SOLN
10.0000 mg | Freq: Two times a day (BID) | INTRAMUSCULAR | Status: DC
  Administered 2018-11-19 (×2): 10 mg via INTRAVENOUS
  Filled 2018-11-19 (×3): qty 1

## 2018-11-19 MED ORDER — PROPOFOL 500 MG/50ML IV EMUL
INTRAVENOUS | Status: DC | PRN
  Administered 2018-11-19: 50 ug/kg/min via INTRAVENOUS

## 2018-11-19 MED ORDER — ALBUMIN HUMAN 5 % IV SOLN
INTRAVENOUS | Status: DC | PRN
  Administered 2018-11-19: 11:00:00 via INTRAVENOUS

## 2018-11-19 MED ORDER — OXYCODONE HCL ER 10 MG PO T12A
20.0000 mg | EXTENDED_RELEASE_TABLET | Freq: Two times a day (BID) | ORAL | Status: DC
  Administered 2018-11-19 – 2018-11-20 (×2): 20 mg via ORAL
  Filled 2018-11-19 (×2): qty 2

## 2018-11-19 MED ORDER — HYDROMORPHONE HCL 1 MG/ML IJ SOLN
0.2500 mg | INTRAMUSCULAR | Status: DC | PRN
  Administered 2018-11-19 (×2): 0.5 mg via INTRAVENOUS

## 2018-11-19 MED ORDER — METHOCARBAMOL 500 MG PO TABS
500.0000 mg | ORAL_TABLET | Freq: Four times a day (QID) | ORAL | Status: DC | PRN
  Administered 2018-11-19 – 2018-11-20 (×4): 500 mg via ORAL
  Filled 2018-11-19 (×4): qty 1

## 2018-11-19 MED ORDER — LEVOTHYROXINE SODIUM 100 MCG PO TABS
200.0000 ug | ORAL_TABLET | Freq: Every day | ORAL | Status: DC
  Administered 2018-11-20: 200 ug via ORAL
  Filled 2018-11-19: qty 2

## 2018-11-19 MED ORDER — HYDROMORPHONE HCL 1 MG/ML IJ SOLN
INTRAMUSCULAR | Status: AC
  Filled 2018-11-19: qty 1

## 2018-11-19 MED ORDER — BISACODYL 5 MG PO TBEC: 5.0000 mg | DELAYED_RELEASE_TABLET | Freq: Every day | ORAL | Status: DC | PRN

## 2018-11-19 MED ORDER — FENTANYL CITRATE (PF) 100 MCG/2ML IJ SOLN
INTRAMUSCULAR | Status: AC
  Filled 2018-11-19: qty 2

## 2018-11-19 MED ORDER — SODIUM CHLORIDE 0.9 % IV SOLN
INTRAVENOUS | Status: DC
  Administered 2018-11-19: 16:00:00 via INTRAVENOUS

## 2018-11-19 MED ORDER — APIXABAN 2.5 MG PO TABS: 2.5000 mg | ORAL_TABLET | Freq: Two times a day (BID) | ORAL | 0 refills | Status: DC

## 2018-11-19 MED ORDER — DOCUSATE SODIUM 100 MG PO CAPS: 100.0000 mg | ORAL_CAPSULE | Freq: Two times a day (BID) | ORAL | 0 refills | Status: DC

## 2018-11-19 MED ORDER — DIPHENHYDRAMINE HCL 12.5 MG/5ML PO ELIX: 12.5000 mg | ORAL_SOLUTION | ORAL | Status: DC | PRN

## 2018-11-19 MED ORDER — POLYETHYLENE GLYCOL 3350 17 G PO PACK: 17.0000 g | PACK | Freq: Every day | ORAL | Status: DC | PRN

## 2018-11-19 MED ORDER — ALBUTEROL SULFATE (2.5 MG/3ML) 0.083% IN NEBU
3.0000 mL | INHALATION_SOLUTION | Freq: Two times a day (BID) | RESPIRATORY_TRACT | Status: DC
  Administered 2018-11-19 – 2018-11-20 (×2): 3 mL via RESPIRATORY_TRACT
  Filled 2018-11-19 (×4): qty 3

## 2018-11-19 MED ORDER — HYDROMORPHONE HCL 2 MG PO TABS
2.0000 mg | ORAL_TABLET | ORAL | Status: DC | PRN
  Administered 2018-11-19 – 2018-11-20 (×2): 4 mg via ORAL
  Administered 2018-11-20: 2 mg via ORAL
  Filled 2018-11-19: qty 1
  Filled 2018-11-19 (×2): qty 2

## 2018-11-19 MED ORDER — BUPIVACAINE IN DEXTROSE 0.75-8.25 % IT SOLN
INTRATHECAL | Status: DC | PRN
  Administered 2018-11-19: 1.4 mL via INTRATHECAL

## 2018-11-19 MED ORDER — HYDROMORPHONE HCL 2 MG PO TABS: 2.0000 mg | ORAL_TABLET | Freq: Four times a day (QID) | ORAL | 0 refills | Status: DC | PRN

## 2018-11-19 MED ORDER — MONTELUKAST SODIUM 10 MG PO TABS
10.0000 mg | ORAL_TABLET | Freq: Every day | ORAL | Status: DC
  Administered 2018-11-20: 10 mg via ORAL
  Filled 2018-11-19: qty 1

## 2018-11-19 MED ORDER — APIXABAN 2.5 MG PO TABS
2.5000 mg | ORAL_TABLET | Freq: Two times a day (BID) | ORAL | Status: DC
  Administered 2018-11-20: 2.5 mg via ORAL
  Filled 2018-11-19: qty 1

## 2018-11-19 MED ORDER — BUPIVACAINE HCL (PF) 0.5 % IJ SOLN
INTRAMUSCULAR | Status: AC
  Filled 2018-11-19: qty 30

## 2018-11-19 MED ORDER — MOMETASONE FURO-FORMOTEROL FUM 200-5 MCG/ACT IN AERO: 2.0000 | INHALATION_SPRAY | Freq: Two times a day (BID) | RESPIRATORY_TRACT | Status: DC

## 2018-11-19 MED ORDER — ACETAMINOPHEN 10 MG/ML IV SOLN: 1000.0000 mg | Freq: Once | INTRAVENOUS | Status: DC | PRN

## 2018-11-19 MED ORDER — GABAPENTIN 300 MG PO CAPS
300.0000 mg | ORAL_CAPSULE | Freq: Three times a day (TID) | ORAL | Status: DC
  Administered 2018-11-19 – 2018-11-20 (×3): 300 mg via ORAL
  Filled 2018-11-19 (×3): qty 1

## 2018-11-19 MED ORDER — TIZANIDINE HCL 2 MG PO TABS: 2.0000 mg | ORAL_TABLET | Freq: Three times a day (TID) | ORAL | 0 refills | Status: DC | PRN

## 2018-11-19 MED ORDER — LIDOCAINE 2% (20 MG/ML) 5 ML SYRINGE
INTRAMUSCULAR | Status: AC
  Filled 2018-11-19: qty 5

## 2018-11-19 MED ORDER — ONDANSETRON HCL 4 MG PO TABS: 4.0000 mg | ORAL_TABLET | Freq: Four times a day (QID) | ORAL | Status: DC | PRN

## 2018-11-19 MED ORDER — MAGNESIUM CITRATE PO SOLN: 1.0000 | Freq: Once | ORAL | Status: DC | PRN

## 2018-11-19 MED ORDER — LIDOCAINE 2% (20 MG/ML) 5 ML SYRINGE
INTRAMUSCULAR | Status: DC | PRN
  Administered 2018-11-19: 40 mg via INTRAVENOUS

## 2018-11-19 MED ORDER — SODIUM CHLORIDE 0.9 % IV SOLN
INTRAVENOUS | Status: DC | PRN
  Administered 2018-11-19: 25 ug/min via INTRAVENOUS

## 2018-11-19 MED ORDER — EPHEDRINE SULFATE-NACL 50-0.9 MG/10ML-% IV SOSY
PREFILLED_SYRINGE | INTRAVENOUS | Status: DC | PRN
  Administered 2018-11-19 (×5): 5 mg via INTRAVENOUS

## 2018-11-19 MED ORDER — BUDESONIDE 0.25 MG/2ML IN SUSP
0.2500 mg | Freq: Two times a day (BID) | RESPIRATORY_TRACT | Status: DC
  Administered 2018-11-19 – 2018-11-20 (×2): 0.25 mg via RESPIRATORY_TRACT
  Filled 2018-11-19 (×3): qty 2

## 2018-11-19 MED ORDER — ACETAMINOPHEN 500 MG PO TABS
1000.0000 mg | ORAL_TABLET | Freq: Four times a day (QID) | ORAL | Status: AC
  Administered 2018-11-19 – 2018-11-20 (×3): 1000 mg via ORAL
  Filled 2018-11-19 (×3): qty 2

## 2018-11-19 MED ORDER — MIDAZOLAM HCL 2 MG/2ML IJ SOLN
2.0000 mg | Freq: Once | INTRAMUSCULAR | Status: AC
  Administered 2018-11-19: 2 mg via INTRAVENOUS

## 2018-11-19 MED ORDER — HYDROCODONE-ACETAMINOPHEN 7.5-325 MG PO TABS: 1.0000 | ORAL_TABLET | Freq: Once | ORAL | Status: DC | PRN

## 2018-11-19 MED ORDER — AMLODIPINE BESYLATE 10 MG PO TABS
10.0000 mg | ORAL_TABLET | Freq: Every day | ORAL | Status: DC
  Administered 2018-11-20: 10 mg via ORAL
  Filled 2018-11-19: qty 1

## 2018-11-19 MED ORDER — SODIUM CHLORIDE (PF) 0.9 % IJ SOLN
INTRAMUSCULAR | Status: DC | PRN
  Administered 2018-11-19: 50 mL via INTRAVENOUS

## 2018-11-19 SURGICAL SUPPLY — 66 items
ATTUNE MED DOME PAT 38 KNEE (Knees) ×2 IMPLANT
ATTUNE PS FEM LT SZ 6 CEM KNEE (Femur) ×2 IMPLANT
ATTUNE PSRP INSR SZ6 5 KNEE (Insert) ×2 IMPLANT
BANDAGE ESMARK 6X9 LF (GAUZE/BANDAGES/DRESSINGS) ×1 IMPLANT
BASE TIBIA ATTUNE KNEE SYS SZ6 (Knees) ×1 IMPLANT
BENZOIN TINCTURE PRP APPL 2/3 (GAUZE/BANDAGES/DRESSINGS) ×2 IMPLANT
BIOPATCH RED 1 DISK 7.0 (GAUZE/BANDAGES/DRESSINGS) ×2 IMPLANT
BLADE SAGITTAL 25.0X1.19X90 (BLADE) ×2 IMPLANT
BLADE SAW SAG 90X13X1.27 (BLADE) ×2 IMPLANT
BNDG ELASTIC 6X15 VLCR STRL LF (GAUZE/BANDAGES/DRESSINGS) ×2 IMPLANT
BNDG ESMARK 6X9 LF (GAUZE/BANDAGES/DRESSINGS) ×2
BOWL SMART MIX CTS (DISPOSABLE) ×2 IMPLANT
CEMENT HV SMART SET (Cement) ×2 IMPLANT
COVER SURGICAL LIGHT HANDLE (MISCELLANEOUS) ×2 IMPLANT
COVER WAND RF STERILE (DRAPES) ×2 IMPLANT
CUFF TOURNIQUET SINGLE 34IN LL (TOURNIQUET CUFF) ×2 IMPLANT
CUFF TOURNIQUET SINGLE 44IN (TOURNIQUET CUFF) IMPLANT
DRAPE EXTREMITY T 121X128X90 (DRAPE) ×2 IMPLANT
DRAPE U-SHAPE 47X51 STRL (DRAPES) ×2 IMPLANT
DRSG AQUACEL AG ADV 3.5X10 (GAUZE/BANDAGES/DRESSINGS) ×2 IMPLANT
DRSG PAD ABDOMINAL 8X10 ST (GAUZE/BANDAGES/DRESSINGS) ×2 IMPLANT
DURAPREP 26ML APPLICATOR (WOUND CARE) ×2 IMPLANT
ELECT REM PT RETURN 9FT ADLT (ELECTROSURGICAL) ×2
ELECTRODE REM PT RTRN 9FT ADLT (ELECTROSURGICAL) ×1 IMPLANT
EVACUATOR 1/8 PVC DRAIN (DRAIN) IMPLANT
FACESHIELD WRAPAROUND (MASK) ×2 IMPLANT
GAUZE SPONGE 4X4 12PLY STRL (GAUZE/BANDAGES/DRESSINGS) ×2 IMPLANT
GLOVE BIO SURGEON STRL SZ 6.5 (GLOVE) ×4 IMPLANT
GLOVE BIO SURGEON STRL SZ7 (GLOVE) ×4 IMPLANT
GLOVE BIOGEL M 6.5 STRL (GLOVE) ×4 IMPLANT
GLOVE BIOGEL PI IND STRL 8 (GLOVE) ×2 IMPLANT
GLOVE BIOGEL PI INDICATOR 8 (GLOVE) ×2
GLOVE ECLIPSE 7.5 STRL STRAW (GLOVE) ×4 IMPLANT
GOWN STRL REUS W/ TWL LRG LVL3 (GOWN DISPOSABLE) ×2 IMPLANT
GOWN STRL REUS W/ TWL XL LVL3 (GOWN DISPOSABLE) ×2 IMPLANT
GOWN STRL REUS W/TWL LRG LVL3 (GOWN DISPOSABLE) ×2
GOWN STRL REUS W/TWL XL LVL3 (GOWN DISPOSABLE) ×2
HANDPIECE INTERPULSE COAX TIP (DISPOSABLE) ×1
HOOD PEEL AWAY FACE SHEILD DIS (HOOD) ×6 IMPLANT
IMMOBILIZER KNEE 22 (SOFTGOODS) ×2 IMPLANT
IMMOBILIZER KNEE 22 UNIV (SOFTGOODS) ×2 IMPLANT
KIT BASIN OR (CUSTOM PROCEDURE TRAY) ×2 IMPLANT
KIT TURNOVER KIT B (KITS) ×2 IMPLANT
MANIFOLD NEPTUNE II (INSTRUMENTS) ×2 IMPLANT
NEEDLE 22X1 1/2 (OR ONLY) (NEEDLE) ×2 IMPLANT
NS IRRIG 1000ML POUR BTL (IV SOLUTION) ×2 IMPLANT
PACK TOTAL JOINT (CUSTOM PROCEDURE TRAY) ×2 IMPLANT
PAD ARMBOARD 7.5X6 YLW CONV (MISCELLANEOUS) ×4 IMPLANT
PIN STEINMAN FIXATION KNEE (PIN) ×2 IMPLANT
SET HNDPC FAN SPRY TIP SCT (DISPOSABLE) ×1 IMPLANT
STAPLER VISISTAT 35W (STAPLE) IMPLANT
STRIP CLOSURE SKIN 1/2X4 (GAUZE/BANDAGES/DRESSINGS) IMPLANT
SUCTION FRAZIER HANDLE 10FR (MISCELLANEOUS) ×1
SUCTION TUBE FRAZIER 10FR DISP (MISCELLANEOUS) ×1 IMPLANT
SUT MNCRL AB 3-0 PS2 18 (SUTURE) IMPLANT
SUT VIC AB 0 CTB1 27 (SUTURE) ×4 IMPLANT
SUT VIC AB 1 CT1 27 (SUTURE) ×2
SUT VIC AB 1 CT1 27XBRD ANBCTR (SUTURE) ×2 IMPLANT
SUT VIC AB 2-0 CTB1 (SUTURE) ×4 IMPLANT
SYR CONTROL 10ML LL (SYRINGE) ×4 IMPLANT
TIBIA ATTUNE KNEE SYS BASE SZ6 (Knees) ×2 IMPLANT
TOWEL OR 17X24 6PK STRL BLUE (TOWEL DISPOSABLE) ×2 IMPLANT
TOWEL OR 17X26 10 PK STRL BLUE (TOWEL DISPOSABLE) ×2 IMPLANT
TRAY CATH 16FR W/PLASTIC CATH (SET/KITS/TRAYS/PACK) IMPLANT
TRAY FOLEY MTR SLVR 16FR STAT (SET/KITS/TRAYS/PACK) IMPLANT
WRAP KNEE MAXI GEL POST OP (GAUZE/BANDAGES/DRESSINGS) ×2 IMPLANT

## 2018-11-19 NOTE — Anesthesia Procedure Notes (Signed)
Procedure Name: MAC Date/Time: 11/19/2018 10:06 AM Performed by: Candis Shine, CRNA Pre-anesthesia Checklist: Patient identified, Emergency Drugs available, Suction available, Patient being monitored and Timeout performed Patient Re-evaluated:Patient Re-evaluated prior to induction Oxygen Delivery Method: Simple face mask Dental Injury: Teeth and Oropharynx as per pre-operative assessment

## 2018-11-19 NOTE — Discharge Instructions (Signed)
INSTRUCTIONS AFTER JOINT REPLACEMENT  ° °o Remove items at home which could result in a fall. This includes throw rugs or furniture in walking pathways °o ICE to the affected joint every three hours while awake for 30 minutes at a time, for at least the first 3-5 days, and then as needed for pain and swelling.  Continue to use ice for pain and swelling. You may notice swelling that will progress down to the foot and ankle.  This is normal after surgery.  Elevate your leg when you are not up walking on it.   °o Continue to use the breathing machine you got in the hospital (incentive spirometer) which will help keep your temperature down.  It is common for your temperature to cycle up and down following surgery, especially at night when you are not up moving around and exerting yourself.  The breathing machine keeps your lungs expanded and your temperature down. ° ° °DIET:  As you were doing prior to hospitalization, we recommend a well-balanced diet. ° °DRESSING / WOUND CARE / SHOWERING ° °Keep the surgical dressing until follow up.  The dressing is water proof, so you can shower without any extra covering.  IF THE DRESSING FALLS OFF or the wound gets wet inside, change the dressing with sterile gauze.  Please use good hand washing techniques before changing the dressing.  Do not use any lotions or creams on the incision until instructed by your surgeon.   ° °ACTIVITY ° °o Increase activity slowly as tolerated, but follow the weight bearing instructions below.   °o No driving for 6 weeks or until further direction given by your physician.  You cannot drive while taking narcotics.  °o No lifting or carrying greater than 10 lbs. until further directed by your surgeon. °o Avoid periods of inactivity such as sitting longer than an hour when not asleep. This helps prevent blood clots.  °o You may return to work once you are authorized by your doctor.  ° ° ° °WEIGHT BEARING  ° °Weight bearing as tolerated with assist  device (walker, cane, etc) as directed, use it as long as suggested by your surgeon or therapist, typically at least 4-6 weeks. ° ° °EXERCISES ° °Results after joint replacement surgery are often greatly improved when you follow the exercise, range of motion and muscle strengthening exercises prescribed by your doctor. Safety measures are also important to protect the joint from further injury. Any time any of these exercises cause you to have increased pain or swelling, decrease what you are doing until you are comfortable again and then slowly increase them. If you have problems or questions, call your caregiver or physical therapist for advice.  ° °Rehabilitation is important following a joint replacement. After just a few days of immobilization, the muscles of the leg can become weakened and shrink (atrophy).  These exercises are designed to build up the tone and strength of the thigh and leg muscles and to improve motion. Often times heat used for twenty to thirty minutes before working out will loosen up your tissues and help with improving the range of motion but do not use heat for the first two weeks following surgery (sometimes heat can increase post-operative swelling).  ° °These exercises can be done on a training (exercise) mat, on the floor, on a table or on a bed. Use whatever works the best and is most comfortable for you.    Use music or television while you are exercising so that   the exercises are a pleasant break in your day. This will make your life better with the exercises acting as a break in your routine that you can look forward to.   Perform all exercises about fifteen times, three times per day or as directed.  You should exercise both the operative leg and the other leg as well. ° °Exercises include: °  °• Quad Sets - Tighten up the muscle on the front of the thigh (Quad) and hold for 5-10 seconds.   °• Straight Leg Raises - With your knee straight (if you were given a brace, keep it on),  lift the leg to 60 degrees, hold for 3 seconds, and slowly lower the leg.  Perform this exercise against resistance later as your leg gets stronger.  °• Leg Slides: Lying on your back, slowly slide your foot toward your buttocks, bending your knee up off the floor (only go as far as is comfortable). Then slowly slide your foot back down until your leg is flat on the floor again.  °• Angel Wings: Lying on your back spread your legs to the side as far apart as you can without causing discomfort.  °• Hamstring Strength:  Lying on your back, push your heel against the floor with your leg straight by tightening up the muscles of your buttocks.  Repeat, but this time bend your knee to a comfortable angle, and push your heel against the floor.  You may put a pillow under the heel to make it more comfortable if necessary.  ° °A rehabilitation program following joint replacement surgery can speed recovery and prevent re-injury in the future due to weakened muscles. Contact your doctor or a physical therapist for more information on knee rehabilitation.  ° ° °CONSTIPATION ° °Constipation is defined medically as fewer than three stools per week and severe constipation as less than one stool per week.  Even if you have a regular bowel pattern at home, your normal regimen is likely to be disrupted due to multiple reasons following surgery.  Combination of anesthesia, postoperative narcotics, change in appetite and fluid intake all can affect your bowels.  ° °YOU MUST use at least one of the following options; they are listed in order of increasing strength to get the job done.  They are all available over the counter, and you may need to use some, POSSIBLY even all of these options:   ° °Drink plenty of fluids (prune juice may be helpful) and high fiber foods °Colace 100 mg by mouth twice a day  °Senokot for constipation as directed and as needed Dulcolax (bisacodyl), take with full glass of water  °Miralax (polyethylene glycol)  once or twice a day as needed. ° °If you have tried all these things and are unable to have a bowel movement in the first 3-4 days after surgery call either your surgeon or your primary doctor.   ° °If you experience loose stools or diarrhea, hold the medications until you stool forms back up.  If your symptoms do not get better within 1 week or if they get worse, check with your doctor.  If you experience "the worst abdominal pain ever" or develop nausea or vomiting, please contact the office immediately for further recommendations for treatment. ° ° °ITCHING:  If you experience itching with your medications, try taking only a single pain pill, or even half a pain pill at a time.  You can also use Benadryl over the counter for itching or also to   help with sleep.  ° °TED HOSE STOCKINGS:  Use stockings on both legs until for at least 2 weeks or as directed by physician office. They may be removed at night for sleeping. ° °MEDICATIONS:  See your medication summary on the “After Visit Summary” that nursing will review with you.  You may have some home medications which will be placed on hold until you complete the course of blood thinner medication.  It is important for you to complete the blood thinner medication as prescribed. ° °PRECAUTIONS:  If you experience chest pain or shortness of breath - call 911 immediately for transfer to the hospital emergency department.  ° °If you develop a fever greater that 101 F, purulent drainage from wound, increased redness or drainage from wound, foul odor from the wound/dressing, or calf pain - CONTACT YOUR SURGEON.   °                                                °FOLLOW-UP APPOINTMENTS:  If you do not already have a post-op appointment, please call the office for an appointment to be seen by your surgeon.  Guidelines for how soon to be seen are listed in your “After Visit Summary”, but are typically between 1-4 weeks after surgery. ° °OTHER INSTRUCTIONS:  ° °Knee  Replacement:  Do not place pillow under knee, focus on keeping the knee straight while resting. CPM instructions: 0-90 degrees, 2 hours in the morning, 2 hours in the afternoon, and 2 hours in the evening. Place foam block, curve side up under heel at all times except when in CPM or when walking.  DO NOT modify, tear, cut, or change the foam block in any way. ° °MAKE SURE YOU:  °• Understand these instructions.  °• Get help right away if you are not doing well or get worse.  ° ° °Thank you for letting us be a part of your medical care team.  It is a privilege we respect greatly.  We hope these instructions will help you stay on track for a fast and full recovery!  ° °Information on my medicine - ELIQUIS® (apixaban) ° °This medication education was reviewed with me or my healthcare representative as part of my discharge preparation. ° °Why was Eliquis® prescribed for you? °Eliquis® was prescribed for you to reduce the risk of blood clots forming after orthopedic surgery.   ° °What do You need to know about Eliquis®? °Take your Eliquis® TWICE DAILY - one tablet in the morning and one tablet in the evening with or without food.  It would be best to take the dose about the same time each day. ° °If you have difficulty swallowing the tablet whole please discuss with your pharmacist how to take the medication safely. ° °Take Eliquis® exactly as prescribed by your doctor and DO NOT stop taking Eliquis® without talking to the doctor who prescribed the medication.  Stopping without other medication to take the place of Eliquis® may increase your risk of developing a clot. ° °After discharge, you should have regular check-up appointments with your healthcare provider that is prescribing your Eliquis®. ° °What do you do if you miss a dose? °If a dose of ELIQUIS® is not taken at the scheduled time, take it as soon as possible on the same day and twice-daily administration should be resumed.  The   dose should not be doubled to  make up for a missed dose.  Do not take more than one tablet of ELIQUIS at the same time. ° °Important Safety Information °A possible side effect of Eliquis® is bleeding. You should call your healthcare provider right away if you experience any of the following: °? Bleeding from an injury or your nose that does not stop. °? Unusual colored urine (red or dark brown) or unusual colored stools (red or black). °? Unusual bruising for unknown reasons. °? A serious fall or if you hit your head (even if there is no bleeding). ° °Some medicines may interact with Eliquis® and might increase your risk of bleeding or clotting while on Eliquis®. To help avoid this, consult your healthcare provider or pharmacist prior to using any new prescription or non-prescription medications, including herbals, vitamins, non-steroidal anti-inflammatory drugs (NSAIDs) and supplements. ° °This website has more information on Eliquis® (apixaban): http://www.eliquis.com/eliquis/home ° ° °

## 2018-11-19 NOTE — Evaluation (Signed)
Physical Therapy Evaluation Patient Details Name: Kyle Blair MRN: [id number] DOB: 1961-06-26 Today's Date: 11/19/2018   History of Present Illness  Pt is a 57 y/o male s/p elective L TKA. PMH includes CVA, asthma, HTN, tubular adenoma of colon, and R TKA.   Clinical Impression  Pt is s/p surgery above with deficits below. Pt with lethargy and slowed processing during session, likely secondary to meds, so mobility limited to chair. Required min to mod A for mobility using RW. Limited tolerance for HEP, so will need to review during next session. Reviewed knee precautions with pt. Will continue to follow acutely to maximize functional mobility independence and safety.     Follow Up Recommendations Follow surgeon's recommendation for DC plan and follow-up therapies;Supervision for mobility/OOB    Equipment Recommendations  3in1 (PT);Rolling walker with 5" wheels    Recommendations for Other Services       Precautions / Restrictions Precautions Precautions: Knee Precaution Booklet Issued: Yes (comment) Precaution Comments: Reviewed knee precautions with pt.  Restrictions Weight Bearing Restrictions: Yes LLE Weight Bearing: Weight bearing as tolerated      Mobility  Bed Mobility Overal bed mobility: Needs Assistance Bed Mobility: Supine to Sit     Supine to sit: Mod assist     General bed mobility comments: Mod A for trunk elevation and LLE assist. Increased time required to come to EOB.   Transfers Overall transfer level: Needs assistance Equipment used: Rolling walker (2 wheeled) Transfers: Sit to/from [facility] Sit to Stand: Mod assist Stand pivot transfers: Min assist       General transfer comment: Mod A for lift assist and steadying assist. Cues for safe hand placement. Min A for steadying during transfer to chair. Pt with lethargy, therefore mobility limited to chair.   Ambulation/Gait                Stairs             Wheelchair Mobility    Modified Rankin (Stroke Patients Only)       Balance Overall balance assessment: Needs assistance Sitting-balance support: No upper extremity supported;Feet supported Sitting balance-Leahy Scale: Fair     Standing balance support: Bilateral upper extremity supported;During functional activity Standing balance-Leahy Scale: Poor Standing balance comment: Reliant on BUE and external support                              Pertinent Vitals/Pain Pain Assessment: Faces Faces Pain Scale: Hurts even more Pain Location: L knee  Pain Descriptors / Indicators: Aching;Operative site guarding Pain Intervention(s): Limited activity within patient's tolerance;Monitored during session;Repositioned    Home Living Family/patient expects to be discharged to:: Private residence Living Arrangements: Spouse/significant other Available Help at Discharge: Family Type of Home: House Home Access: Stairs to enter Entrance Stairs-Rails: None Technical brewer of Steps: 2 Home Layout: One level Home Equipment: Environmental consultant - 4 wheels      Prior Function Level of Independence: Independent               Hand Dominance        Extremity/Trunk Assessment   Upper Extremity Assessment Upper Extremity Assessment: Defer to OT evaluation    Lower Extremity Assessment Lower Extremity Assessment: LLE deficits/detail LLE Deficits / Details: Deficits consistent with post op pain and weakness.     Cervical / Trunk Assessment Cervical / Trunk Assessment: Normal  Communication   Communication: No difficulties  Cognition Arousal/Alertness:  Lethargic;Suspect due to medications Behavior During Therapy: Flat affect Overall Cognitive Status: No family/caregiver present to determine baseline cognitive functioning                                 General Comments: Pt with slow processing and took increased time to respond to questions. Likely secondary  to medication. No family present to establish baseline.       General Comments General comments (skin integrity, edema, etc.): No family present during session. Pt with limited tolerance for ther ex secondary to lethargy.     Exercises Total Joint Exercises Ankle Circles/Pumps: AROM;Both;5 reps   Assessment/Plan    PT Assessment Patient needs continued PT services  PT Problem List Decreased strength;Decreased balance;Decreased activity tolerance;Decreased mobility;Decreased knowledge of use of DME;Decreased knowledge of precautions;Pain       PT Treatment Interventions DME instruction;Gait training;Stair training;Functional mobility training;Therapeutic exercise;Therapeutic activities;Balance training;Patient/family education    PT Goals (Current goals can be found in the Care Plan section)  Acute Rehab PT Goals Patient Stated Goal: to decrease pain  PT Goal Formulation: With patient Time For Goal Achievement: 12/03/18 Potential to Achieve Goals: Good    Frequency 7X/week   Barriers to discharge        Co-evaluation               AM-PAC PT "6 Clicks" Mobility  Outcome Measure Help needed turning from your back to your side while in a flat bed without using bedrails?: A Lot Help needed moving from lying on your back to sitting on the side of a flat bed without using bedrails?: A Lot Help needed moving to and from a bed to a chair (including a wheelchair)?: A Little Help needed standing up from a chair using your arms (e.g., wheelchair or bedside chair)?: A Lot Help needed to walk in hospital room?: A Lot Help needed climbing 3-5 steps with a railing? : A Lot 6 Click Score: 13    End of Session Equipment Utilized During Treatment: Gait belt Activity Tolerance: Patient limited by lethargy Patient left: in chair;with call bell/phone within reach Nurse Communication: Mobility status PT Visit Diagnosis: Unsteadiness on feet (R26.81);Muscle weakness (generalized)  (M62.81);Pain Pain - Right/Left: Left Pain - part of body: Knee    Time: 3235-5732 PT Time Calculation (min) (ACUTE ONLY): 20 min   Charges:   PT Evaluation $PT Eval Low Complexity: [place], PT, DPT  Acute Rehabilitation Services  Pager: [phone] Office: [phone]   Rudean Hitt 11/19/2018, 6:12 PM

## 2018-11-19 NOTE — Care Plan (Signed)
Ortho Bundle Case Management Note  Patient Details  Name: BRICYN LABRADA MRN: [id number] Date of Birth: [birth year]  Spoke with patient prior to surgery. Will discharge to home with family. Has equipment except CPM and this has been ordered.                     DME Arranged:  [facility] DME Agency:  [facility]  HH Arranged:  PT [place] Agency:  [place]  Additional Comments: Please contact me with any questions of if this plan should need to change.  Ladell Heads,  [facility]  [phone] 11/19/2018, 3:27 PM

## 2018-11-19 NOTE — Op Note (Signed)
NAME: Kyle, Blair MEDICAL RECORD DX:[id number] ACCOUNT [id number] DATE OF BIRTH:12-09-1961 FACILITY: [facility] LOCATION: [id number] PHYSICIAN:Tatiana Courter L. 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  PROCEDURE:  Left total knee replacement with an Attune system, size 6 femur, size 6 tibia, 5 mm bridging bearing, 38 mm all polyethylene patella.  SURGEON:  Dorna Leitz, MD  ASSISTANT:  Gaspar Skeeters PA-C, was present for the entire case and assisted with retraction, bone cuts, and closing to minimize OR time.    BRIEF HISTORY:  The patient is a 57 year old male with a long history of significant complaints of bilateral knee pain.  He had a right total knee replacement several years ago and never was really completely happy with the result.  He continued to have  some pain off and on.  We followed him over time and ultimately felt that his left knee became a worse problem.  He had night pain and light activity pain.  He had bone-on-bone change.  We felt that getting his left knee fixed was probably the better  course of action prior to embarking back on his right knee.  At this point he was taken to the operating room for left total knee replacement.  DESCRIPTION OF PROCEDURE:  The patient was taken to the operating room after adequate anesthesia was obtained with a spinal anesthetic.  The patient was placed supine on the operating table.  Left leg was prepped and draped in sterile fashion.  Following  this, a midline incision was made in subcutaneous tissue was bluntly dissected down to the level of the extensor mechanism.  Medial parapatellar arthrotomy was undertaken.  Once that was completed, the medial and lateral meniscus were removed,  retropatellar fat pad,  synovium on the anterior aspect of the femur, and the anterior and posterior cruciates.  Attention was then turned to the femur where an intramedullary pilot hole was drilled and an alignment rod was placed and a 4 degree valgus  inclination cut was made with 9 mm of distal bone.  Once this was done, the femur sized to a 6.  Anterior and posterior cuts were made chamfers and box.  Attention was then turned towards the tibia where it is cut perpendicular to its long axis.  It was  then drilled and keeled.  Then, a size 6 was the appropriate size on the tibial side as well.  Once this was done, a 5 mm bridging bearing is placed.  Excellent range of motion and stability were achieved.  Next, a 6 mm bridging bearing was placed at  this time.  Excellent range of motion and stability were achieved.  Attention was then turned to the patella.  It was cut down.  A 9.5 mm of patellar bone was removed and a 38 paddle is placed and the lugs were drilled for the patella.  Patellar trial  was placed and knee put through a range of motion, excellent stability and range of motion are achieved at this point.  At this point, what we believe is that the components are fitting quite well.  The patient was noted to have severe amounts of scar  type tissue in the very thickened and aggressive synovial reaction.  At that point, I felt that an extensive synovectomy was appropriate and this was performed and at this point,  we irrigated the knee thoroughly suctioned it dry and then we placed our  final components, size 6 tibia, size 6 femur, went to a 6 mm bridging bearing trial and a 38 all poly patella held with a clamp.  At this point, all excess bone cement was removed.  The cement was allowed to completely harden and when done so, the knee  was put through a range of motion, did midline tracking in terms of the alignment of the tibia.  At that point, I felt given his poor result on the other side with difficulty with range of  motion and stiffness, that maybe cutting down to a 5 mm insert  made sense and we did this.  We still had very nice range of motion and stability.  At this point, the medial parapatellar arthrotomy was closed with #1 Vicryl running.  When we let the tourniquet down, there was a little bit more ooze than I am used to  seeing and again with his history of having some stiffness and difficulty after the first surgery, I felt that putting a drain in made sense.  We did use a medium Hemovac drain, closed the medial parapatellar arthrotomy with #1 Vicryl running, closed the  skin with 0 and 2-0 Vicryl and 3-0 Monocryl subcuticular.  Benzoin and Steri-Strips were applied.  Sterile compressive dressing was applied and the patient was taken to recovery was noted to be in satisfactory condition.  Estimated blood loss for  procedure was less than 100 mL.  TN/NUANCE  D:11/19/2018 T:11/19/2018 JOB:[id number]

## 2018-11-19 NOTE — Telephone Encounter (Signed)
Received Medical/Surgical Clearance Form from [facility], faxed on Fri, 11/16/18, pt chart reflects that he had the Sx today; forwarded to provider/SLS 11/25

## 2018-11-19 NOTE — Anesthesia Procedure Notes (Signed)
Anesthesia Regional Block: Adductor canal block   Pre-Anesthetic Checklist: ,, timeout performed, Correct Patient, Correct Site, Correct Laterality, Correct Procedure, Correct Position, site marked, Risks and benefits discussed,  Surgical consent,  Pre-op evaluation,  At surgeon's request and post-op pain management  Laterality: Left  Prep: Maximum Sterile Barrier Precautions used, chloraprep       Needles:  Injection technique: Single-shot  Needle Type: Echogenic Stimulator Needle     Needle Length: 9cm  Needle Gauge: 22     Additional Needles:   Procedures:,,,, ultrasound used (permanent image in chart),,,,  Narrative:  Start time: 11/19/2018 9:25 AM End time: 11/19/2018 9:35 AM Injection made incrementally with aspirations every 5 mL.  Performed by: Personally  Anesthesiologist: Freddrick March, MD  Additional Notes: Monitors applied. No increased pain on injection. No increased resistance to injection. Injection made in 5cc increments. Good needle visualization. Patient tolerated procedure well.

## 2018-11-19 NOTE — Brief Op Note (Signed)
11/19/2018  3:21 PM  PATIENT:  Kyle Blair  57 y.o. male  PRE-OPERATIVE DIAGNOSIS:  * No pre-op diagnosis entered *  POST-OPERATIVE DIAGNOSIS:  OSTEOARTHRITIS LEFT  KNEE  PROCEDURE:  Procedure(s): LEFT  TOTAL KNEE ARTHROPLASTY (Left)  SURGEON:  Surgeon(s) and Role:    Dorna Leitz, MD - Primary  PHYSICIAN ASSISTANT:   ASSISTANTS: bethune   ANESTHESIA:   spinal  EBL:  75 mL   BLOOD ADMINISTERED:none  DRAINS: none   LOCAL MEDICATIONS USED:  MARCAINE    and OTHER experel  SPECIMEN:  No Specimen  DISPOSITION OF SPECIMEN:  N/A  COUNTS:  YES  TOURNIQUET:   Total Tourniquet Time Documented: Thigh (Left) - 57 minutes Total: Thigh (Left) - 57 minutes   DICTATION: .Other Dictation: Dictation Number [id number]  PLAN OF CARE: Admit to inpatient   PATIENT DISPOSITION:  PACU - hemodynamically stable.   Delay start of Pharmacological VTE agent (>24hrs) due to surgical blood loss or risk of bleeding: no

## 2018-11-19 NOTE — Transfer of Care (Signed)
Immediate Anesthesia Transfer of Care Note  Patient: Kyle Blair  Procedure(s) Performed: LEFT  TOTAL KNEE ARTHROPLASTY (Left )  Patient Location: PACU  Anesthesia Type:Spinal and MAC combined with regional for post-op pain  Level of Consciousness: awake, alert  and oriented  Airway & Oxygen Therapy: Patient Spontanous Breathing and Patient connected to face mask oxygen  Post-op Assessment: Report given to RN and Post -op Vital signs reviewed and stable  Post vital signs: Reviewed and stable  Last Vitals:  Vitals Value Taken Time  BP 101/71 11/19/2018 12:12 PM  Temp    Pulse 82 11/19/2018 12:13 PM  Resp 11 11/19/2018 12:13 PM  SpO2 97 % 11/19/2018 12:13 PM  Vitals shown include unvalidated device data.  Last Pain:  Vitals:   11/19/18 0942  TempSrc:   PainSc: 0-No pain      Patients Stated Pain Goal: 3 (46/65/99 3570)  Complications: No apparent anesthesia complications

## 2018-11-19 NOTE — Progress Notes (Signed)
Orthopedic Tech Progress Note Patient Details:  Kyle Blair Nov 01, 1961 [id number]  CPM Left Knee CPM Left Knee: On Left Knee Flexion (Degrees): 90 Left Knee Extension (Degrees): 0 Additional Comments: foot roll  Post Interventions Patient Tolerated: Well Instructions Provided: Care of device, Adjustment of device  [place] Pink 11/19/2018, 12:25 PM

## 2018-11-19 NOTE — Anesthesia Procedure Notes (Signed)
Spinal  Patient location during procedure: OR Start time: 11/19/2018 10:10 AM End time: 11/19/2018 10:20 AM Staffing Anesthesiologist: Freddrick March, MD Performed: anesthesiologist  Preanesthetic Checklist Completed: patient identified, surgical consent, pre-op evaluation, timeout performed, IV checked, risks and benefits discussed and monitors and equipment checked Spinal Block Patient position: sitting Prep: site prepped and draped and DuraPrep Patient monitoring: cardiac monitor, continuous pulse ox and blood pressure Approach: midline Location: L3-4 Injection technique: single-shot Needle Needle type: Whitacre  Needle gauge: 22 G Needle length: 9 cm Assessment Sensory level: T6 Additional Notes Functioning IV was confirmed and monitors were applied. Sterile prep and drape, including hand hygiene and sterile gloves were used. The patient was positioned and the spine was prepped. The skin was anesthetized with lidocaine.  Free flow of clear CSF was obtained prior to injecting local anesthetic into the CSF.  The spinal needle aspirated freely following injection.  The needle was carefully withdrawn.  The patient tolerated the procedure well.

## 2018-11-19 NOTE — Anesthesia Preprocedure Evaluation (Addendum)
Anesthesia Evaluation  Patient identified by MRN, date of birth, ID band Patient awake    Reviewed: Allergy & Precautions, NPO status , Patient's Chart, lab work & pertinent test results  Airway Mallampati: III  TM Distance: >3 FB Neck ROM: Full    Dental  (+) Teeth Intact, Dental Advisory Given   Pulmonary asthma , sleep apnea , Current Smoker,    breath sounds clear to auscultation       Cardiovascular hypertension, negative cardio ROS   Rhythm:Regular Rate:Normal     Neuro/Psych CVA negative psych ROS   GI/Hepatic Neg liver ROS, hiatal hernia, GERD  ,  Endo/Other  Hypothyroidism   Renal/GU negative Renal ROS  negative genitourinary   Musculoskeletal  (+) Arthritis , Osteoarthritis,    Abdominal   Peds negative pediatric ROS (+)  Hematology negative hematology ROS (+)   Anesthesia Other Findings   Reproductive/Obstetrics negative OB ROS                            Anesthesia Physical Anesthesia Plan  ASA: III  Anesthesia Plan: Spinal and Regional   Post-op Pain Management:  Regional for Post-op pain   Induction: Intravenous  PONV Risk Score and Plan: 1 and Treatment may vary due to age or medical condition and Propofol infusion  Airway Management Planned: Natural Airway  Additional Equipment:   Intra-op Plan:   Post-operative Plan:   Informed Consent: I have reviewed the patients History and Physical, chart, labs and discussed the procedure including the risks, benefits and alternatives for the proposed anesthesia with the patient or authorized representative who has indicated his/her understanding and acceptance.   Dental advisory given  Plan Discussed with: CRNA  Anesthesia Plan Comments:        Anesthesia Quick Evaluation

## 2018-11-20 ENCOUNTER — Encounter (HOSPITAL_COMMUNITY): Payer: Self-pay | Admitting: Orthopedic Surgery

## 2018-11-20 ENCOUNTER — Other Ambulatory Visit: Payer: Self-pay

## 2018-11-20 DIAGNOSIS — Z96652 Presence of left artificial knee joint: Secondary | ICD-10-CM | POA: Diagnosis not present

## 2018-11-20 DIAGNOSIS — M1712 Unilateral primary osteoarthritis, left knee: Secondary | ICD-10-CM | POA: Diagnosis not present

## 2018-11-20 LAB — CBC
HCT: 39 % (ref 39.0–52.0)
HEMOGLOBIN: 12.9 g/dL — AB (ref 13.0–17.0)
MCH: 29.6 pg (ref 26.0–34.0)
MCHC: 33.1 g/dL (ref 30.0–36.0)
MCV: 89.4 fL (ref 80.0–100.0)
PLATELETS: 150 10*3/uL (ref 150–400)
RBC: 4.36 MIL/uL (ref 4.22–5.81)
RDW: 13.1 % (ref 11.5–15.5)
WBC: 11.1 10*3/uL — ABNORMAL HIGH (ref 4.0–10.5)
nRBC: 0 % (ref 0.0–0.2)

## 2018-11-20 NOTE — Discharge Summary (Signed)
Patient ID: Kyle Blair MRN: [id number] DOB/AGE: 03-02-1961 57 y.o.  Admit date: 11/19/2018 Discharge date: 11/20/2018  Admission Diagnoses:  Principal Problem:   Primary osteoarthritis of left knee   Discharge Diagnoses:  Same  Past Medical History:  Diagnosis Date  . Arthritis    severe; pt takes strong pain meds daily/legs  . Asthma    MONTH AGO  . BPH (benign prostatic hypertrophy)   . Diverticulosis   . GERD with stricture   . Hiatal hernia   . History of stroke 2004  . Hyperlipidemia   . Hypertension   . Hypothyroid   . Lung nodule 02/03/2012  . Osteoarthritis of left knee   . Polycystic kidney disease   . Stroke ([place])    2004   EQULIBRIUM, AND JUDGING DISTANCE  . Tubular adenoma of colon     Surgeries: Procedure(s): LEFT  TOTAL KNEE ARTHROPLASTY on 11/19/2018   Consultants:   Discharged Condition: Improved  Hospital Course: Kyle Blair is an 57 y.o. male who was admitted 11/19/2018 for operative treatment ofPrimary osteoarthritis of left knee. Patient has severe unremitting pain that affects sleep, daily activities, and work/hobbies. After pre-op clearance the patient was taken to the operating room on 11/19/2018 and underwent  Procedure(s): LEFT  TOTAL KNEE ARTHROPLASTY.    Patient was given perioperative antibiotics:  Anti-infectives (From admission, onward)   Start     Dose/Rate Route Frequency Ordered Stop   11/19/18 1600  clindamycin (CLEOCIN) IVPB 600 mg     600 mg 100 mL/hr over 30 Minutes Intravenous Every 8 hours 11/19/18 1327 11/20/18 0027   11/19/18 1330  ceFAZolin (ANCEF) IVPB 2g/100 mL premix  Status:  Discontinued     2 g 200 mL/hr over 30 Minutes Intravenous Every 6 hours 11/19/18 1318 11/19/18 1323   11/19/18 0845  clindamycin (CLEOCIN) IVPB 900 mg     900 mg 100 mL/hr over 30 Minutes Intravenous On call to O.R. 11/19/18 0835 11/19/18 1019       Patient was given sequential compression devices, early ambulation, and  chemoprophylaxis to prevent DVT.  Patient benefited maximally from hospital stay and there were no complications.    Recent vital signs:  Patient Vitals for the past 24 hrs:  BP Temp Temp src Pulse Resp SpO2 Height Weight  11/20/18 0450 (!) 146/91 98.5 F (36.9 C) Oral 97 19 93 % - -  11/19/18 2113 138/78 - - 92 19 90 % - -  11/19/18 2055 - - - - - 93 % - -  11/19/18 2052 - - - - - 93 % - -  11/19/18 1321 107/77 97.7 F (36.5 C) Oral 84 - 90 % - -  11/19/18 1306 114/72 (!) 97.5 F (36.4 C) - 85 16 94 % - -  11/19/18 1255 110/77 - - 84 10 91 % - -  11/19/18 1241 116/79 - - 87 17 90 % - -  11/19/18 1226 109/74 - - 85 12 94 % - -  11/19/18 1212 101/71 97.9 F (36.6 C) - 82 12 98 % - -  11/19/18 0942 127/78 - - 77 18 92 % - -  11/19/18 0940 133/72 - - 77 13 92 % - -  11/19/18 0935 138/80 - - 73 14 96 % - -  11/19/18 0839 (!) 144/88 97.8 F (36.6 C) Oral 80 18 92 % 5\' 7"  (1.702 m) 99.8 kg     Recent laboratory studies:  Recent Labs    11/20/18 0344  WBC 11.1*  HGB 12.9*  HCT 39.0  PLT 150     Discharge Medications:   Allergies as of 11/20/2018      Reactions   Aspirin Anaphylaxis   Doxycycline Hyclate Swelling   Facial Swelling   Ibuprofen Anaphylaxis   Jynarque [tolvaptan] Anaphylaxis   Peanut Butter Flavor Anaphylaxis   Cephalexin Rash      Medication List    STOP taking these medications   amoxicillin-clavulanate 875-125 MG tablet Commonly known as:  AUGMENTIN   azithromycin 250 MG tablet Commonly known as:  ZITHROMAX   methocarbamol 500 MG tablet Commonly known as:  ROBAXIN   nystatin 100000 UNIT/ML suspension Commonly known as:  MYCOSTATIN   predniSONE 10 MG tablet Commonly known as:  DELTASONE   ranitidine 150 MG capsule Commonly known as:  ZANTAC   varenicline 1 MG tablet Commonly known as:  CHANTIX     TAKE these medications   acetaminophen 325 MG tablet Commonly known as:  TYLENOL Take 1-2 tablets (325-650 mg total) by mouth every 6  (six) hours as needed for mild pain or moderate pain.   ADVAIR DISKUS 250-50 MCG/DOSE Aepb Generic drug:  Fluticasone-Salmeterol USE ONE INHALATION TWICE A DAY.   amLODipine 10 MG tablet Commonly known as:  NORVASC Take 1 tablet (10 mg total) by mouth daily.   apixaban 2.5 MG Tabs tablet Commonly known as:  ELIQUIS Take 1 tablet (2.5 mg total) by mouth 2 (two) times daily.   benazepril 10 MG tablet Commonly known as:  LOTENSIN TAKE 1 TABLET(10 MG) BY MOUTH DAILY What changed:    how much to take  how to take this  when to take this   docusate sodium 100 MG capsule Commonly known as:  COLACE Take 1 capsule (100 mg total) by mouth 2 (two) times daily.   EVZIO 2 MG/0.4ML Soaj Generic drug:  Naloxone HCl Inject 2 mg into the muscle.   fluticasone 110 MCG/ACT inhaler Commonly known as:  FLOVENT HFA 2 puffs twice daily when your asthma is not well controlled. What changed:    how much to take  how to take this  when to take this  reasons to take this   furosemide 20 MG tablet Commonly known as:  LASIX Take 1 tablet (20 mg total) by mouth daily as needed. What changed:  reasons to take this   gabapentin 300 MG capsule Commonly known as:  NEURONTIN Take 300 mg by mouth 3 (three) times daily.   HYDROmorphone 2 MG tablet Commonly known as:  DILAUDID Take 1-2 tablets (2-4 mg total) by mouth every 6 (six) hours as needed for severe pain.   levothyroxine 200 MCG tablet Commonly known as:  SYNTHROID, LEVOTHROID TAKE ONE (1) TABLET BY MOUTH EACH DAY BEFORE BREAKFAST What changed:  See the new instructions.   lovastatin 40 MG tablet Commonly known as:  MEVACOR TAKE ONE (1) TABLET BY MOUTH EACH DAY ATBEDTIME What changed:  See the new instructions.   montelukast 10 MG tablet Commonly known as:  SINGULAIR TAKE ONE TABLET EVERY MORNING What changed:    how much to take  how to take this  when to take this   oxyCODONE-acetaminophen 7.5-325 MG  tablet Commonly known as:  PERCOCET Take 1 tablet by mouth every 8 (eight) hours as needed for moderate pain.   pantoprazole 40 MG tablet Commonly known as:  PROTONIX TAKE ONE (1) TABLET BY MOUTH EVERY DAY BEFORE BREAKFAST ON AN EMPTY STOMACH What changed:  See  the new instructions.   tamsulosin 0.4 MG Caps capsule Commonly known as:  FLOMAX TAKE ONE (1) CAPSULE EACH DAY BY MOUTH What changed:  See the new instructions.   tiZANidine 2 MG tablet Commonly known as:  ZANAFLEX Take 1 tablet (2 mg total) by mouth every 8 (eight) hours as needed for muscle spasms.   TUSSIN DM COUGH + CHEST 10-200 MG/5ML Liqd Generic drug:  Dextromethorphan-guaiFENesin Take 10 mLs by mouth at bedtime.   VENTOLIN HFA 108 (90 Base) MCG/ACT inhaler Generic drug:  albuterol INHALE 2 PUFFS INTO THE LUNGS EVERY 4 HOURS AS NEEDED FOR WHEEZING ORSHORTNESS OF BREATH What changed:  See the new instructions.   albuterol (2.5 MG/3ML) 0.083% nebulizer solution Commonly known as:  PROVENTIL Take 3 mLs (2.5 mg total) by nebulization every 6 (six) hours as needed for wheezing or shortness of breath. What changed:  Another medication with the same name was changed. Make sure you understand how and when to take each.   XTAMPZA ER 18 MG C12a Generic drug:  oxyCODONE ER Take 18 mg by mouth 2 (two) times daily.            Discharge Care Instructions  (From admission, onward)         Start     Ordered   11/20/18 0000  Weight bearing as tolerated    Question Answer Comment  Laterality left   Extremity Lower      11/20/18 0824          Diagnostic Studies: No results found.  Disposition: Discharge disposition: 01-Home or Self Care       Discharge Instructions    CPM   Complete by:  As directed    Continuous passive motion machine (CPM):      Use the CPM from 0 to 60 for 8 hours per day.      You may increase by 5-10 per day.  You may break it up into 2 or 3 sessions per day.      Use CPM  for 1-2 weeks or until you are told to stop.   Call MD / Call 911   Complete by:  As directed    If you experience chest pain or shortness of breath, CALL 911 and be transported to the hospital emergency room.  If you develope a fever above 101 F, pus (white drainage) or increased drainage or redness at the wound, or calf pain, call your surgeon's office.   Constipation Prevention   Complete by:  As directed    Drink plenty of fluids.  Prune juice may be helpful.  You may use a stool softener, such as Colace (over the counter) 100 mg twice a day.  Use MiraLax (over the counter) for constipation as needed.   Do not put a pillow under the knee. Place it under the heel.   Complete by:  As directed    Increase activity slowly as tolerated   Complete by:  As directed    Weight bearing as tolerated   Complete by:  As directed    Laterality:  left   Extremity:  Lower      Follow-up Information    Dorna Leitz, MD. Go on 12/03/2018.   Specialty:  Orthopedic Surgery Why:  Your appointment has been made for 1015 Contact information: [place] [address] [phone]        [facility] Follow up.   Specialty:  Home Health Services Why:  HHPT  will come out to your home for 5 visits prior to you going to [facility].  Contact information: [address] [place] [address] [place] .        [facility]. Go on 12/03/2018.   Why:  your appointment has been made for 1:45. Please arrive a few minutes early to complete your paperwork.  Contact information: [address]  [place] [address]  [phone]           Signed: Erlene Senters 11/20/2018, 8:24 AM

## 2018-11-20 NOTE — Progress Notes (Signed)
Subjective: 1 Day Post-Op Procedure(s) (LRB): LEFT  TOTAL KNEE ARTHROPLASTY (Left) Patient reports pain as moderate.  Take by mouth and voiding okay.  Has been getting up to the bathroom independently.  Objective: Vital signs in last 24 hours: Temp:  [97.5 F (36.4 C)-98.5 F (36.9 C)] 98.5 F (36.9 C) (11/26 0450) Pulse Rate:  [73-97] 97 (11/26 0450) Resp:  [10-19] 19 (11/26 0450) BP: (101-146)/(71-91) 146/91 (11/26 0450) SpO2:  [90 %-98 %] 93 % (11/26 0450) Weight:  [99.8 kg] 99.8 kg (11/25 0839)  Intake/Output from previous day: 11/25 0701 - 11/26 0700 In: 2838.2 [P.O.:150; I.V.:2388.2; IV Piggyback:250] Out: 675 [Urine:200; Drains:400; Blood:75] Intake/Output this shift: No intake/output data recorded.  Recent Labs    11/20/18 0344  HGB 12.9*   Recent Labs    11/20/18 0344  WBC 11.1*  RBC 4.36  HCT 39.0  PLT 150   No results for input(s): NA, K, CL, CO2, BUN, CREATININE, GLUCOSE, CALCIUM in the last 72 hours. No results for input(s): LABPT, INR in the last 72 hours. Left knee exam: Sensation intact distally Intact pulses distally Dorsiflexion/Plantar flexion intact Incision: dressing C/D/I Compartment soft  Hemovac drain intact   Patient's anticipated LOS is less than 2 midnights, meeting these requirements: - Younger than 66 - Lives within 1 hour of care - Has a competent adult at home to recover with post-op recover - NO history of    - Diabetes  - Coronary Artery Disease  - Heart failure  - Heart attack  - Stroke  - DVT/VTE  - Cardiac arrhythmia  - Respiratory Failure/COPD  - Renal failure  - Anemia  - Advanced Liver disease      Assessment/Plan: 1 Day Post-Op Procedure(s) (LRB): LEFT  TOTAL KNEE ARTHROPLASTY (Left)  Plan: WBAT on left lower extremity. Hemovac drain pulled Up with therapy Discharge home with home health after physical therapy today.  Follow-up with Dr. Berenice Primas in 10 to 14 days.    Erlene Senters 11/20/2018,  8:21 AM

## 2018-11-20 NOTE — Progress Notes (Signed)
Pt given prescriptions and discharge instructions. Information gone over with him and answered all questions. All belongings and equipment gathered to be sent home with him.

## 2018-11-20 NOTE — Evaluation (Signed)
Occupational Therapy Evaluation Patient Details Name: Kyle Blair MRN: [id number] DOB: December 05, 1961 Today's Date: 11/20/2018    History of Present Illness Pt is a 57 y/o male s/p elective L TKA. PMH includes CVA, asthma, HTN, tubular adenoma of colon, and R TKA.    Clinical Impression   PTA, pt was living with his wife and was independent. Pt currently requiring Mod A for LB ADLs and Min A for functional mobility with RW. Pt performing toilet transfer to regular height toilet with Min A. Pt presenting with decreased balance and safety awareness requiring increased cues for impulsivity and safety. Pt planning for dc home later today. Pt will require further acute OT to address LB ADLs and tub/shower transfer. Recommend dc to home with HHOT for further OT to optimize safety, independence with ADLs, and return to PLOF.      Follow Up Recommendations  Home health OT;Supervision/Assistance - 24 hour    Equipment Recommendations  None recommended by OT    Recommendations for Other Services PT consult     Precautions / Restrictions Precautions Precautions: Knee Precaution Booklet Issued: Yes (comment) Precaution Comments: Reviewed knee precautions/positioning with pt.  Restrictions Weight Bearing Restrictions: Yes LLE Weight Bearing: Weight bearing as tolerated      Mobility Bed Mobility Overal bed mobility: Needs Assistance Bed Mobility: Supine to Sit     Supine to sit: Min guard;HOB elevated     General bed mobility comments: Providing pt with education on using sheet to mobilize LLE  Transfers Overall transfer level: Needs assistance Equipment used: Rolling walker (2 wheeled) Transfers: Sit to/from Stand Sit to Stand: Min assist Stand pivot transfers: Min assist       General transfer comment: assist to power up into standing; cues for safe hand placement    Balance Overall balance assessment: Needs assistance Sitting-balance support: No upper extremity  supported;Feet supported Sitting balance-Leahy Scale: Fair     Standing balance support: Bilateral upper extremity supported;During functional activity Standing balance-Leahy Scale: Poor Standing balance comment: Reliant on BUE and external support                            ADL either performed or assessed with clinical judgement   ADL Overall ADL's : Needs assistance/impaired Eating/Feeding: Independent;Sitting   Grooming: Minimal assistance;Standing;Wash/dry hands;Wash/dry face;Brushing hair   Upper Body Bathing: Set up;Supervision/ safety;Sitting   Lower Body Bathing: Moderate assistance;Sit to/from stand   Upper Body Dressing : Supervision/safety;Set up;Sitting   Lower Body Dressing: Moderate assistance;Sit to/from stand   Toilet Transfer: Minimal assistance;Ambulation;Regular Toilet;RW;Grab bars Toilet Transfer Details (indicate cue type and reason): Min A for power up and for safe descent. Pt with poor awareness and requiring Min A to for correct positioning prior to descent. Pt also requiring Max cues Toileting- Clothing Manipulation and Hygiene: Min guard;Sitting/lateral lean Toileting - Clothing Manipulation Details (indicate cue type and reason): Min Guard A for safety. Pt presenting with poor safety awareness.      Functional mobility during ADLs: Minimal assistance;Rolling walker General ADL Comments: Pt with decreased awareness, balance, and is highly impulsive.     Vision Baseline Vision/History: Wears glasses Patient Visual Report: No change from baseline       Perception     Praxis      Pertinent Vitals/Pain Pain Assessment: Faces Pain Score: 7  Faces Pain Scale: Hurts even more Pain Location: L knee  Pain Descriptors / Indicators: Aching;Guarding;Grimacing Pain Intervention(s): Monitored during session;Limited  activity within patient's tolerance;Repositioned     Hand Dominance Left   Extremity/Trunk Assessment Upper Extremity  Assessment Upper Extremity Assessment: Overall WFL for tasks assessed   Lower Extremity Assessment Lower Extremity Assessment: Defer to PT evaluation LLE Deficits / Details: Deficits consistent with post op pain and weakness.    Cervical / Trunk Assessment Cervical / Trunk Assessment: Normal   Communication Communication Communication: No difficulties   Cognition Arousal/Alertness: Awake/alert Behavior During Therapy: WFL for tasks assessed/performed Overall Cognitive Status: No family/caregiver present to determine baseline cognitive functioning Area of Impairment: Following commands;Safety/judgement;Problem solving;Awareness;Attention                   Current Attention Level: Sustained   Following Commands: Follows one step commands inconsistently Safety/Judgement: Decreased awareness of safety;Decreased awareness of deficits Awareness: Emergent Problem Solving: Difficulty sequencing;Requires verbal cues;Slow processing General Comments: Pt with prior CVA, no family present to confirm baseline cognition. Pt with decreased attention, following commands, and safety awareness. Requiring Max cues throughout session.   General Comments  No family present to comfirm support and information    Exercises Exercises: Total Joint Total Joint Exercises Ankle Circles/Pumps: AROM;Both;5 reps   Shoulder Instructions      Home Living Family/patient expects to be discharged to:: Private residence Living Arrangements: Spouse/significant other Available Help at Discharge: Family Type of Home: House Home Access: Stairs to enter Technical brewer of Steps: 2 Entrance Stairs-Rails: None Home Layout: One level     Bathroom Shower/Tub: Teacher, early years/pre: Standard     Home Equipment: Environmental consultant - 4 wheels          Prior Functioning/Environment Level of Independence: Independent                 OT Problem List: Decreased strength;Decreased range of  motion;Decreased activity tolerance;Impaired balance (sitting and/or standing);Decreased knowledge of use of DME or AE;Decreased safety awareness;Decreased knowledge of precautions;Pain      OT Treatment/Interventions: Self-care/ADL training;Therapeutic exercise;Energy conservation;DME and/or AE instruction;Therapeutic activities;Patient/family education    OT Goals(Current goals can be found in the care plan section) Acute Rehab OT Goals Patient Stated Goal: "Get out of here today" OT Goal Formulation: With patient Time For Goal Achievement: 12/04/18 Potential to Achieve Goals: Good  OT Frequency: Min 2X/week   Barriers to D/C:            Co-evaluation              AM-PAC OT "6 Clicks" Daily Activity     Outcome Measure Help from another person eating meals?: None Help from another person taking care of personal grooming?: A Little Help from another person toileting, which includes using toliet, bedpan, or urinal?: A Little Help from another person bathing (including washing, rinsing, drying)?: A Lot Help from another person to put on and taking off regular upper body clothing?: A Little Help from another person to put on and taking off regular lower body clothing?: A Lot 6 Click Score: 17   End of Session Equipment Utilized During Treatment: Gait belt;Rolling walker Nurse Communication: Mobility status;Precautions;Other (comment)(IV pulled)  Activity Tolerance: Patient tolerated treatment well Patient left: in chair;with call bell/phone within reach;with family/visitor present  OT Visit Diagnosis: Unsteadiness on feet (R26.81);Other abnormalities of gait and mobility (R26.89);Muscle weakness (generalized) (M62.81);Pain Pain - Right/Left: Left Pain - part of body: Knee                Time: 4944-9675 OT Time Calculation (min): 41 min Charges:  OT General Charges $OT Visit: 1 Visit OT Evaluation $OT Eval Moderate Complexity: 1 Mod OT Treatments $Self Care/Home  Management : 23-37 mins  Fanchon Papania MSOT, OTR/L Acute Rehab Pager: [phone] Office: [place] 11/20/2018, 10:00 AM

## 2018-11-20 NOTE — Progress Notes (Signed)
Physical Therapy Treatment Patient Details Name: Kyle Blair MRN: [id number] DOB: [birth year] Today's Date: 11/20/2018    History of Present Illness Pt is a 57 y/o male s/p elective L TKA. PMH includes CVA, asthma, HTN, tubular adenoma of colon, and R TKA.     PT Comments    Patient seen for mobility progression. Pt is able to ambulate ~150 ft with RW and min/mod A with KI donned. Pt with limited weight bearing on L LE due to c/o pain and anxiety. Pt will need to practice stairs again prior to d/c home. Continue to progress as tolerated.    Follow Up Recommendations  Follow surgeon's recommendation for DC plan and follow-up therapies;Supervision for mobility/OOB     Equipment Recommendations  3in1 (PT);Rolling walker with 5" wheels    Recommendations for Other Services       Precautions / Restrictions Precautions Precautions: Knee Precaution Booklet Issued: Yes (comment) Precaution Comments: Reviewed knee precautions/positioning with pt.  Restrictions Weight Bearing Restrictions: Yes LLE Weight Bearing: Weight bearing as tolerated    Mobility  Bed Mobility Overal bed mobility: Needs Assistance Bed Mobility: Supine to Sit     Supine to sit: Mod assist     General bed mobility comments: pt OOB in chair on arrival   Transfers Overall transfer level: Needs assistance Equipment used: Rolling walker (2 wheeled) Transfers: Sit to/from Stand Sit to Stand: Min assist Stand pivot transfers: Min assist       General transfer comment: assist to power up into standing; cues for safe hand placement  Ambulation/Gait Ambulation/Gait assistance: Min assist;Mod assist Gait Distance (Feet): 150 Feet Assistive device: Rolling walker (2 wheeled) Gait Pattern/deviations: Step-to pattern;Decreased stance time - left;Decreased step length - right;Decreased weight shift to left;Antalgic;Trunk flexed Gait velocity: decreased    General Gait Details: cues for posture, safe use  of AD, and increased weight bearing on L LE as pt tends to maintain PWB to NWB on L LE; pt with improving sequencing with distance; initially needing assistance for guiding RW     Stairs Stairs: Yes Stairs assistance: Min assist Stair Management: No rails;Step to pattern;Backwards;With walker Number of Stairs: 1 General stair comments: assist to stabilize RW; cues for sequencing and technique; pt ascended one step and then declined attempt of second step due to fatigue and SOB   Wheelchair Mobility    Modified Rankin (Stroke Patients Only)       Balance Overall balance assessment: Needs assistance Sitting-balance support: No upper extremity supported;Feet supported Sitting balance-Leahy Scale: Fair     Standing balance support: Bilateral upper extremity supported;During functional activity Standing balance-Leahy Scale: Poor Standing balance comment: Reliant on BUE and external support                             Cognition Arousal/Alertness: Awake/alert Behavior During Therapy: Flat affect Overall Cognitive Status: No family/caregiver present to determine baseline cognitive functioning Area of Impairment: Following commands;Safety/judgement;Problem solving                       Following Commands: Follows one step commands inconsistently Safety/Judgement: Decreased awareness of safety;Decreased awareness of deficits   Problem Solving: Difficulty sequencing;Requires verbal cues;Slow processing General Comments: Pt with slow processing and took increased time to respond to questions. Likely secondary to medication. No family present to establish baseline.       Exercises Total Joint Exercises Ankle Circles/Pumps: AROM;Both;5 reps  General Comments        Pertinent Vitals/Pain Pain Assessment: Faces Pain Score: 7  Faces Pain Scale: Hurts even more Pain Location: L knee  Pain Descriptors / Indicators: Aching;Guarding;Grimacing Pain  Intervention(s): Limited activity within patient's tolerance;Monitored during session;Premedicated before session;Repositioned    Home Living Family/patient expects to be discharged to:: Private residence Living Arrangements: Spouse/significant other Available Help at Discharge: Family Type of Home: House Home Access: Stairs to enter Entrance Stairs-Rails: None Home Layout: One level Home Equipment: Environmental consultant - 4 wheels      Prior Function Level of Independence: Independent          PT Goals (current goals can now be found in the care plan section) Acute Rehab PT Goals Patient Stated Goal: to decrease pain  Progress towards PT goals: Progressing toward goals    Frequency    7X/week      PT Plan Current plan remains appropriate    Co-evaluation              AM-PAC PT "6 Clicks" Mobility   Outcome Measure  Help needed turning from your back to your side while in a flat bed without using bedrails?: A Lot Help needed moving from lying on your back to sitting on the side of a flat bed without using bedrails?: A Lot Help needed moving to and from a bed to a chair (including a wheelchair)?: A Little Help needed standing up from a chair using your arms (e.g., wheelchair or bedside chair)?: A Little Help needed to walk in hospital room?: A Little Help needed climbing 3-5 steps with a railing? : A Lot 6 Click Score: 15    End of Session Equipment Utilized During Treatment: Gait belt Activity Tolerance: Patient tolerated treatment well Patient left: in chair;with call bell/phone within reach Nurse Communication: Mobility status PT Visit Diagnosis: Unsteadiness on feet (R26.81);Muscle weakness (generalized) (M62.81);Pain Pain - Right/Left: Left Pain - part of body: Knee     Time: 8768-1157 PT Time Calculation (min) (ACUTE ONLY): 27 min  Charges:  $Gait Training: 23-37 mins                     Earney Navy, PTA Acute Rehabilitation Services Pager: [phone] Office: [phone]     Darliss Cheney 11/20/2018, 9:43 AM

## 2018-11-20 NOTE — Anesthesia Postprocedure Evaluation (Signed)
Anesthesia Post Note  Patient: Kyle Blair  Procedure(s) Performed: LEFT  TOTAL KNEE ARTHROPLASTY (Left )     Patient location during evaluation: PACU Anesthesia Type: Regional and Spinal Level of consciousness: oriented and awake and alert Pain management: pain level controlled Vital Signs Assessment: post-procedure vital signs reviewed and stable Respiratory status: spontaneous breathing, respiratory function stable and patient connected to nasal cannula oxygen Cardiovascular status: blood pressure returned to baseline and stable Postop Assessment: no headache, no backache and no apparent nausea or vomiting Anesthetic complications: no    Last Vitals:  Vitals:   11/19/18 2113 11/20/18 0450  BP: 138/78 (!) 146/91  Pulse: 92 97  Resp: 19 19  Temp:  36.9 C  SpO2: 90% 93%    Last Pain:  Vitals:   11/20/18 0823  TempSrc:   PainSc: 8                  Jarmar Rousseau L Amere Bricco

## 2018-11-20 NOTE — Progress Notes (Signed)
Physical Therapy Treatment Patient Details Name: Kyle Blair MRN: [id number] DOB: 12/05/1961 Today's Date: 11/20/2018    History of Present Illness Pt is a 57 y/o male s/p elective L TKA. PMH includes CVA, asthma, HTN, tubular adenoma of colon, and R TKA.     PT Comments    Patient seen second session for continued gait and stair training. Pt continues to require min A for gait and min A +2 for safety with ascending/descending stairs simulating home entrance. Pt given stair handout and pt's son will assist. Pt will need to use KI upon d/c due to poor quad control resulting in L knee buckling at times. Pt educated to continue gait training without KI when [facility] therapist is present. Pt fatigued end of session. Continue to progress as tolerated.      Follow Up Recommendations  Follow surgeon's recommendation for DC plan and follow-up therapies;Supervision for mobility/OOB     Equipment Recommendations  3in1 (PT);Rolling walker with 5" wheels    Recommendations for Other Services       Precautions / Restrictions Precautions Precautions: Knee Precaution Booklet Issued: Yes (comment) Precaution Comments: Reviewed knee precautions/positioning with pt and wife and son present  Restrictions Weight Bearing Restrictions: Yes LLE Weight Bearing: Weight bearing as tolerated    Mobility  Bed Mobility Overal bed mobility: Needs Assistance Bed Mobility: Supine to Sit     Supine to sit: Min guard;HOB elevated     General bed mobility comments: pt up with OT upon beginning of session and in recliner end of session  Transfers Overall transfer level: Needs assistance Equipment used: Rolling walker (2 wheeled) Transfers: Sit to/from Stand Sit to Stand: Min assist         General transfer comment: min guard for safe descent to surface; cues for safe hand placement  Ambulation/Gait Ambulation/Gait assistance: Min assist Gait Distance (Feet): 100 Feet Assistive device: Rolling  walker (2 wheeled) Gait Pattern/deviations: Step-to pattern;Decreased stance time - left;Decreased step length - right;Decreased weight shift to left;Antalgic;Trunk flexed Gait velocity: decreased    General Gait Details: cues for posture, safe use of AD, and increased weight bearing on L LE, and L quad activation during stance phase; pt with L knee instability noted and will need to use KI upon d/c      Stairs   Stairs assistance: Min assist;+2 safety/equipment Stair Management: No rails;Step to pattern;Backwards;With walker   General stair comments: assist to stabilize RW and for balance; cues for sequencing, L quad activation during single stance, and technique   Wheelchair Mobility    Modified Rankin (Stroke Patients Only)       Balance Overall balance assessment: Needs assistance Sitting-balance support: No upper extremity supported;Feet supported Sitting balance-Leahy Scale: Fair     Standing balance support: Bilateral upper extremity supported;During functional activity Standing balance-Leahy Scale: Poor Standing balance comment: Reliant on BUE and external support                             Cognition Arousal/Alertness: Awake/alert Behavior During Therapy: Flat affect Overall Cognitive Status: No family/caregiver present to determine baseline cognitive functioning Area of Impairment: Following commands;Safety/judgement;Problem solving                   Current Attention Level: Sustained   Following Commands: Follows one step commands inconsistently Safety/Judgement: Decreased awareness of safety;Decreased awareness of deficits Awareness: Emergent Problem Solving: Difficulty sequencing;Requires verbal cues;Slow processing General Comments: Pt with  prior CVA. Continues to present with decreased safety awareness and poor attention. Pt requiring Max cues throughout      Exercises      General Comments        Pertinent Vitals/Pain Pain  Assessment: Faces Faces Pain Scale: Hurts even more Pain Location: L knee  Pain Descriptors / Indicators: Aching;Guarding;Grimacing;Sore Pain Intervention(s): Limited activity within patient's tolerance;Monitored during session;Premedicated before session;Repositioned    Home Living                      Prior Function            PT Goals (current goals can now be found in the care plan section) Acute Rehab PT Goals Patient Stated Goal: "Get out of here today" Progress towards PT goals: Progressing toward goals    Frequency    7X/week      PT Plan Current plan remains appropriate    Co-evaluation              AM-PAC PT "6 Clicks" Mobility   Outcome Measure  Help needed turning from your back to your side while in a flat bed without using bedrails?: A Lot Help needed moving from lying on your back to sitting on the side of a flat bed without using bedrails?: A Lot Help needed moving to and from a bed to a chair (including a wheelchair)?: A Little Help needed standing up from a chair using your arms (e.g., wheelchair or bedside chair)?: A Little Help needed to walk in hospital room?: A Little Help needed climbing 3-5 steps with a railing? : A Lot 6 Click Score: 15    End of Session Equipment Utilized During Treatment: Gait belt Activity Tolerance: Patient tolerated treatment well Patient left: in chair;with call bell/phone within reach Nurse Communication: Mobility status PT Visit Diagnosis: Unsteadiness on feet (R26.81);Muscle weakness (generalized) (M62.81);Pain Pain - Right/Left: Left Pain - part of body: Knee     Time: 2094-7096 PT Time Calculation (min) (ACUTE ONLY): 21 min  Charges:  $Gait Training: 8-22 mins                      Earney Navy, PTA Acute Rehabilitation Services Pager: [phone] Office: [phone]    Darliss Cheney 11/20/2018, 2:46 PM

## 2018-11-21 NOTE — Progress Notes (Signed)
OT Treatment - Late Entry   Return for second visit to address LB dressing and tub transfer. Pt requiring Min A for LB dressing. Pt requiring Min A +2 for safety during tub transfer. Pt continued to present with poor balance ans safety awareness requiring increased cues throughout session. Continue to recommend dc home with HHOT and will continue to follow acutely as admitted.     11/20/18 1400  OT Visit Information  Last OT Received On 11/20/18  Assistance Needed +1  History of Present Illness Pt is a 57 y/o male s/p elective L TKA. PMH includes CVA, asthma, HTN, tubular adenoma of colon, and R TKA.   Precautions  Precautions Knee  Precaution Booklet Issued Yes (comment)  Precaution Comments Reviewed knee precautions/positioning with pt.   Pain Assessment  Pain Assessment Faces  Faces Pain Scale 6  Pain Location L knee   Pain Descriptors / Indicators Aching;Guarding;Grimacing  Pain Intervention(s) Monitored during session;Limited activity within patient's tolerance;Repositioned  Cognition  Arousal/Alertness Awake/alert  Behavior During Therapy WFL for tasks assessed/performed  Overall Cognitive Status No family/caregiver present to determine baseline cognitive functioning  Area of Impairment Following commands;Safety/judgement;Problem solving;Awareness;Attention  Current Attention Level Sustained  Following Commands Follows one step commands inconsistently  Safety/Judgement Decreased awareness of safety;Decreased awareness of deficits  Awareness Emergent  Problem Solving Difficulty sequencing;Requires verbal cues;Slow processing  General Comments Pt with prior CVA. Continues to present with decreased safety awareness and poor attention. Pt requiring Max cues throughout  Upper Extremity Assessment  Upper Extremity Assessment Overall WFL for tasks assessed  Lower Extremity Assessment  Lower Extremity Assessment Defer to PT evaluation  LLE Deficits / Details Deficits consistent with  post op pain and weakness.   ADL  Overall ADL's  Needs assistance/impaired  Upper Body Dressing  Supervision/safety;Set up;Sitting  Upper Body Dressing Details (indicate cue type and reason) donned shirt  Lower Body Dressing Minimal assistance;Sit to/from stand  Lower Body Dressing Details (indicate cue type and reason) Min A for power up and to maintain balance  Toilet Transfer Minimal assistance;Ambulation;RW (simulated to recliner)  Toilet Transfer Details (indicate cue type and reason) Min A for power up and for safe descent. Pt with poor awareness and requiring Min A to for correct positioning prior to descent. Pt also requiring Max cues  Tub/ Banker Tub transfer;Walk-in shower;Ambulation;Shower seat;Rolling walker;Minimal Media planner Details (indicate cue type and reason) Educating pt on safe tub transfer techniques. Pt planning on sponge bathing at home.   Functional mobility during ADLs Minimal assistance;Rolling walker  General ADL Comments Pt with decreased awareness, balance, and is highly impulsive.  Bed Mobility  Overal bed mobility Needs Assistance  Bed Mobility Supine to Sit  Supine to sit Min guard;HOB elevated  General bed mobility comments Providing pt with education on using sheet to mobilize LLE  Balance  Overall balance assessment Needs assistance  Sitting-balance support No upper extremity supported;Feet supported  Sitting balance-Leahy Scale Fair  Standing balance support Bilateral upper extremity supported;During functional activity  Standing balance-Leahy Scale Poor  Standing balance comment Reliant on BUE and external support   Restrictions  Weight Bearing Restrictions Yes  LLE Weight Bearing WBAT  Transfers  Overall transfer level Needs assistance  Equipment used Rolling walker (2 wheeled)  Transfers Sit to/from Stand  Sit to Stand Min assist  General transfer comment assist to power up into standing; cues for safe hand  placement  OT - End of Session  Equipment Utilized During Treatment Gait belt;Rolling  walker  Activity Tolerance Patient tolerated treatment well  Patient left in chair;with call bell/phone within reach;with family/visitor present  Nurse Communication Mobility status;Precautions;Other (comment) (IV pulled)  OT Assessment/Plan  OT Visit Diagnosis Unsteadiness on feet (R26.81);Other abnormalities of gait and mobility (R26.89);Muscle weakness (generalized) (M62.81);Pain  Pain - Right/Left Left  Pain - part of body Knee  OT Frequency (ACUTE ONLY) Min 2X/week  Recommendations for Other Services PT consult  Follow Up Recommendations Home health OT;Supervision/Assistance - 24 hour  OT Equipment None recommended by OT  AM-PAC OT "6 Clicks" Daily Activity Outcome Measure (Version 2)  Help from another person eating meals? 4  Help from another person taking care of personal grooming? 3  Help from another person toileting, which includes using toliet, bedpan, or urinal? 3  Help from another person bathing (including washing, rinsing, drying)? 2  Help from another person to put on and taking off regular upper body clothing? 3  Help from another person to put on and taking off regular lower body clothing? 2  6 Click Score 17  OT Goal Progression  Progress towards OT goals Progressing toward goals  Acute Rehab OT Goals  Patient Stated Goal "Get out of here today"  OT Goal Formulation With patient  Time For Goal Achievement 12/04/18  Potential to Achieve Goals Good  ADL Goals  Pt Will Perform Lower Body Dressing with min guard assist;sit to/from stand;with adaptive equipment;with caregiver independent in assisting  Pt Will Transfer to Toilet with min guard assist;regular height toilet;ambulating  Pt Will Perform Tub/Shower Transfer Shower transfer;Tub transfer;ambulating;shower seat;rolling walker;with min guard assist  OT Time Calculation  OT Start Time (ACUTE ONLY) 1337  OT Stop Time (ACUTE  ONLY) 1400  OT Time Calculation (min) 23 min  OT General Charges  $OT Visit 1 Visit  OT Treatments  $Self Care/Home Management  23-37 mins    Lawrence, OTR/L Acute Rehab Pager: [phone] Office: [phone]

## 2018-11-23 DIAGNOSIS — Q613 Polycystic kidney, unspecified: Secondary | ICD-10-CM | POA: Diagnosis not present

## 2018-11-23 DIAGNOSIS — J45909 Unspecified asthma, uncomplicated: Secondary | ICD-10-CM | POA: Diagnosis not present

## 2018-11-23 DIAGNOSIS — R69 Illness, unspecified: Secondary | ICD-10-CM | POA: Diagnosis not present

## 2018-11-23 DIAGNOSIS — E785 Hyperlipidemia, unspecified: Secondary | ICD-10-CM | POA: Diagnosis not present

## 2018-11-23 DIAGNOSIS — K219 Gastro-esophageal reflux disease without esophagitis: Secondary | ICD-10-CM | POA: Diagnosis not present

## 2018-11-23 DIAGNOSIS — I10 Essential (primary) hypertension: Secondary | ICD-10-CM | POA: Diagnosis not present

## 2018-11-23 DIAGNOSIS — M1990 Unspecified osteoarthritis, unspecified site: Secondary | ICD-10-CM | POA: Diagnosis not present

## 2018-11-23 DIAGNOSIS — Z471 Aftercare following joint replacement surgery: Secondary | ICD-10-CM | POA: Diagnosis not present

## 2018-11-23 DIAGNOSIS — Z96653 Presence of artificial knee joint, bilateral: Secondary | ICD-10-CM | POA: Diagnosis not present

## 2018-11-23 DIAGNOSIS — K2270 Barrett's esophagus without dysplasia: Secondary | ICD-10-CM | POA: Diagnosis not present

## 2018-11-24 ENCOUNTER — Other Ambulatory Visit: Payer: Self-pay

## 2018-11-24 ENCOUNTER — Encounter (HOSPITAL_BASED_OUTPATIENT_CLINIC_OR_DEPARTMENT_OTHER): Payer: Self-pay | Admitting: Emergency Medicine

## 2018-11-24 ENCOUNTER — Emergency Department (HOSPITAL_BASED_OUTPATIENT_CLINIC_OR_DEPARTMENT_OTHER)
Admission: EM | Admit: 2018-11-24 | Discharge: 2018-11-24 | Discharge status: Against Medical Advice | Payer: Medicare HMO | Attending: Emergency Medicine | Admitting: Emergency Medicine

## 2018-11-24 DIAGNOSIS — Z79899 Other long term (current) drug therapy: Secondary | ICD-10-CM | POA: Diagnosis not present

## 2018-11-24 DIAGNOSIS — G8918 Other acute postprocedural pain: Secondary | ICD-10-CM | POA: Diagnosis not present

## 2018-11-24 DIAGNOSIS — Z96653 Presence of artificial knee joint, bilateral: Secondary | ICD-10-CM | POA: Insufficient documentation

## 2018-11-24 DIAGNOSIS — J45909 Unspecified asthma, uncomplicated: Secondary | ICD-10-CM | POA: Diagnosis not present

## 2018-11-24 DIAGNOSIS — M25562 Pain in left knee: Secondary | ICD-10-CM | POA: Diagnosis not present

## 2018-11-24 DIAGNOSIS — Z91010 Allergy to peanuts: Secondary | ICD-10-CM | POA: Insufficient documentation

## 2018-11-24 DIAGNOSIS — R69 Illness, unspecified: Secondary | ICD-10-CM | POA: Diagnosis not present

## 2018-11-24 DIAGNOSIS — Z8673 Personal history of transient ischemic attack (TIA), and cerebral infarction without residual deficits: Secondary | ICD-10-CM | POA: Insufficient documentation

## 2018-11-24 DIAGNOSIS — F17210 Nicotine dependence, cigarettes, uncomplicated: Secondary | ICD-10-CM | POA: Insufficient documentation

## 2018-11-24 DIAGNOSIS — I10 Essential (primary) hypertension: Secondary | ICD-10-CM | POA: Diagnosis not present

## 2018-11-24 DIAGNOSIS — E039 Hypothyroidism, unspecified: Secondary | ICD-10-CM | POA: Diagnosis not present

## 2018-11-24 MED ORDER — OXYCODONE-ACETAMINOPHEN 5-325 MG PO TABS
1.0000 | ORAL_TABLET | Freq: Once | ORAL | Status: AC
  Administered 2018-11-24: 1 via ORAL
  Filled 2018-11-24: qty 1

## 2018-11-24 NOTE — ED Provider Notes (Signed)
[facility] EMERGENCY DEPARTMENT Provider Note   CSN: [id number] Arrival date & time: 11/24/18  1403     History   Chief Complaint Chief Complaint  Patient presents with  . Post-op Problem    HPI Kyle Blair is a 57 y.o. male s/p total left knee replacement on 11/19/18 by Dr. Berenice Primas, presenting to the ED with acute onset of worsening pain and swelling to the left knee since yesterday. States yesterday he noticed the ace wrap felt too tight and removed it, noticing swelling. Today he began having worsening pain to the posterior medial aspect of the knee/distal thigh. He has assoc bruising the thigh and had some bleeding from his surgical wound which has resolved. Denies purulent drainage, fever, calf pain. Has been compliant with eliquis for post-op DVT Prophylaxis. Called his ortho clinic, [facility], who recommended ED evaluation. Pt presenting with concern for blood clot.   The history is provided by the patient and medical records.    Past Medical History:  Diagnosis Date  . Arthritis    severe; pt takes strong pain meds daily/legs  . Asthma    MONTH AGO  . BPH (benign prostatic hypertrophy)   . Diverticulosis   . GERD with stricture   . Hiatal hernia   . History of stroke 2004  . Hyperlipidemia   . Hypertension   . Hypothyroid   . Lung nodule 02/03/2012  . Osteoarthritis of left knee   . Polycystic kidney disease   . Stroke ([place])    2004   EQULIBRIUM, AND JUDGING DISTANCE  . Tubular adenoma of colon     Patient Active Problem List   Diagnosis Date Noted  . Primary osteoarthritis of left knee 11/19/2018  . OSA (obstructive sleep apnea) 06/02/2018  . 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 Ingrown toenail 05/08/2018  . Onychomycosis 05/08/2018  . Stroke (cerebrum) ([place]) 01/03/2018  . Acute metabolic encephalopathy 19/41/7408  . Tobacco abuse 01/03/2018  . Acute respiratory failure ([place]) 01/19/2017  . Community acquired pneumonia 01/18/2017  .  Influenza with pneumonia 01/18/2017  . Indeterminate pulmonary nodules 07/23/2015  . Obese 05/20/2015  . Undiagnosed cardiac murmurs 04/15/2015  . Asthma with acute exacerbation 03/13/2014  . Hx of adenomatous colonic polyps 05/08/2013  . Barrett's esophagus 05/03/2013  . Chronic diarrhea 04/02/2013  . HTN (hypertension) 04/02/2013  . Hx of low back pain 09/26/2012  . Arthralgia of right knee 05/02/2012  . Asthma 12/13/2011  . Benign prostatic hyperplasia 12/13/2011  . Hyperlipidemia 12/13/2011  . GERD (gastroesophageal reflux disease) 12/13/2011  . Hypothyroid   . Polycystic kidney disease     Past Surgical History:  Procedure Laterality Date  . CARPAL TUNNEL RELEASE    . COLONOSCOPY    . HERNIA REPAIR  2005   with mesh  . KNEE ARTHROSCOPY Bilateral 12/05/13   cyst and bone spur removed from left knee per pt.  Marland Kitchen KNEE ARTHROSCOPY Left 01/2015  . NASAL POLYP EXCISION  1992 or 93  . POLYPECTOMY    . TOTAL KNEE ARTHROPLASTY Right 05/19/2015   Procedure: RIGHT TOTAL KNEE ARTHROPLASTY;  Surgeon: Paralee Cancel, MD;  Location: [facility];  Service: Orthopedics;  Laterality: Right;  . TOTAL KNEE ARTHROPLASTY Left 11/19/2018   Procedure: LEFT  TOTAL KNEE ARTHROPLASTY;  Surgeon: Dorna Leitz, MD;  Location: [facility];  Service: Orthopedics;  Laterality: Left;        Home Medications    Prior to Admission medications   Medication Sig Start Date End Date  Taking? Authorizing Provider  acetaminophen (TYLENOL) 325 MG tablet Take 1-2 tablets (325-650 mg total) by mouth every 6 (six) hours as needed for mild pain or moderate pain. 08/18/17   Domenic Moras, PA-C  ADVAIR DISKUS 250-50 MCG/DOSE AEPB USE ONE INHALATION TWICE A DAY. 11/12/18   [name], NP  albuterol (PROVENTIL) (2.5 MG/3ML) 0.083% nebulizer solution Take 3 mLs (2.5 mg total) by nebulization every 6 (six) hours as needed for wheezing or shortness of breath. 06/20/18   [name], NP  amLODipine (NORVASC) 10 MG tablet  Take 1 tablet (10 mg total) by mouth daily. 10/03/18   [name], NP  apixaban (ELIQUIS) 2.5 MG TABS tablet Take 1 tablet (2.5 mg total) by mouth 2 (two) times daily. 11/19/18   Gary Fleet, PA-C  benazepril (LOTENSIN) 10 MG tablet TAKE 1 TABLET(10 MG) BY MOUTH DAILY Patient taking differently: Take 10 mg by mouth daily. TAKE 1 TABLET(10 MG) BY MOUTH DAILY 09/14/15   [name], NP  Dextromethorphan-guaiFENesin (TUSSIN DM COUGH + CHEST) 10-200 MG/5ML LIQD Take 10 mLs by mouth at bedtime.    [provider]  docusate sodium (COLACE) 100 MG capsule Take 1 capsule (100 mg total) by mouth 2 (two) times daily. 11/19/18   Gary Fleet, PA-C  EVZIO 2 MG/0.4ML SOAJ Inject 2 mg into the muscle.  06/19/17   [provider]  fluticasone (FLOVENT HFA) 110 MCG/ACT inhaler 2 puffs twice daily when your asthma is not well controlled. Patient taking differently: Inhale 2 puffs into the lungs daily as needed (shortness of breath). 2 puffs twice daily when your asthma is not well controlled. 07/20/18   Shelda Pal, DO  furosemide (LASIX) 20 MG tablet Take 1 tablet (20 mg total) by mouth daily as needed. Patient taking differently: Take 20 mg by mouth daily as needed for fluid.  09/18/17   [name], NP  gabapentin (NEURONTIN) 300 MG capsule Take 300 mg by mouth 3 (three) times daily.  09/18/15   [provider]  HYDROmorphone (DILAUDID) 2 MG tablet Take 1-2 tablets (2-4 mg total) by mouth every 6 (six) hours as needed for severe pain. 11/19/18   Gary Fleet, PA-C  levothyroxine (SYNTHROID, LEVOTHROID) 200 MCG tablet TAKE ONE (1) [place] BREAKFAST Patient taking differently: Take 200 mcg by mouth daily before breakfast.  09/07/18   [name], NP  lovastatin (MEVACOR) 40 MG tablet TAKE ONE (1) TABLET BY MOUTH EACH DAY ATBEDTIME Patient taking differently: Take 40 mg by mouth daily.  02/28/18   [name], NP    montelukast (SINGULAIR) 10 MG tablet TAKE ONE TABLET EVERY MORNING Patient taking differently: Take 10 mg by mouth daily. TAKE ONE TABLET EVERY MORNING 12/01/17   [name], NP  oxyCODONE-acetaminophen (PERCOCET) 7.5-325 MG tablet Take 1 tablet by mouth every 8 (eight) hours as needed for moderate pain.  08/11/18   [provider]  pantoprazole (PROTONIX) 40 MG tablet TAKE ONE (1) TABLET BY MOUTH EVERY DAY BEFORE BREAKFAST ON AN EMPTY STOMACH Patient taking differently: Take 40 mg by mouth daily.  09/06/18   Pyrtle, Lajuan Lines, MD  tamsulosin (FLOMAX) 0.4 MG CAPS capsule TAKE ONE (1) CAPSULE EACH DAY BY MOUTH Patient taking differently: Take 0.4 mg by mouth daily.  10/04/18   [name], NP  tiZANidine (ZANAFLEX) 2 MG tablet Take 1 tablet (2 mg total) by mouth every 8 (eight) hours as needed for muscle spasms. 11/19/18   Gary Fleet, PA-C  VENTOLIN HFA  108 (90 Base) MCG/ACT inhaler INHALE 2 PUFFS INTO THE LUNGS EVERY 4 HOURS AS NEEDED FOR WHEEZING ORSHORTNESS OF BREATH Patient taking differently: Inhale 1 puff into the lungs 2 (two) times daily.  06/15/18   [name], NP  XTAMPZA ER 18 MG C12A Take 18 mg by mouth 2 (two) times daily.  08/11/18   [provider]    Family History Family History  Problem Relation Age of Onset  . Heart disease Mother        pacemaker  . Diabetes Father   . Kidney disease Father   . Diabetes Sister   . Kidney disease Sister   . Colon cancer Neg Hx   . Esophageal cancer Neg Hx   . Rectal cancer Neg Hx   . Stomach cancer Neg Hx   . Prostate cancer Neg Hx   . Pancreatic cancer Neg Hx     Social History Social History   Tobacco Use  . Smoking status: Current Every Day Smoker    Packs/day: 0.50    Years: 20.00    Pack years: 10.00    Types: Cigarettes  . Smokeless tobacco: Never Used  . Tobacco comment: 5-6 cigarettes a day  Substance Use Topics  . Alcohol use: No    Alcohol/week: 0.0 standard drinks  .  Drug use: No     Allergies   Aspirin; Doxycycline hyclate; Ibuprofen; Jynarque [tolvaptan]; Peanut butter flavor; and Cephalexin   Review of Systems Review of Systems  Constitutional: Negative for fever.  Musculoskeletal: Positive for arthralgias, joint swelling and myalgias.  Skin: Positive for color change.  All other systems reviewed and are negative.    Physical Exam Updated Vital Signs BP (!) 141/85 (BP Location: Right Arm)   Pulse 91   Temp 98.1 F (36.7 C) (Oral)   Resp 18   Ht 5\' 6"  (1.676 m)   Wt 97.5 kg   SpO2 92%   BMI 34.70 kg/m   Physical Exam  Constitutional: He appears well-developed and well-nourished. No distress.  HENT:  Head: Normocephalic and atraumatic.  Eyes: Conjunctivae are normal.  Cardiovascular: Normal rate and regular rhythm.  Pulmonary/Chest: Effort normal.  Abdominal: Soft.  Musculoskeletal:  Left knee with longitudinal surgical scar to anterior knee, well-approximated and healing well. Wound edges are not red. No purulent drainage. Some dried blood to the superior aspect. Knee is diffusely swollen. Medial/posterior aspect of knee and distal thigh with some tenderness and large area of uniform ecchymosis. Calf is soft and nontender. Intact distal pulses and sensation  Neurological: He is alert.  Skin: Skin is warm.  Psychiatric: He has a normal mood and affect. His behavior is normal.  Nursing note and vitals reviewed.    ED Treatments / Results  Labs (all labs ordered are listed, but only abnormal results are displayed) Labs Reviewed - No data to display  EKG None  Radiology No results found.  Procedures Procedures (including critical care time)  Medications Ordered in ED Medications  oxyCODONE-acetaminophen (PERCOCET/ROXICET) 5-325 MG per tablet 1 tablet (1 tablet Oral Given 11/24/18 1532)     Initial Impression / Assessment and Plan / ED Course  I have reviewed the triage vital signs and the nursing  notes.  Pertinent labs & imaging results that were available during my care of the patient were reviewed by me and considered in my medical decision making (see chart for details).  Clinical Course as of Nov 24 1622  Sat Nov 24, 2018  1528 Spoke with  Radene Journey, colleague of Dr. Berenice Primas who recommends transfer to another ED for vascular U/S.   [JR]    Clinical Course User Index [JR] Dougles Kimmey, [place] N, PA-C   Pt presenting with concern for DVT w post-op pain and swelling following total knee replacement by Dr. Berenice Primas on 11/19/18. Reports compliance with eliquis for DVT prophylaxis. Afebrile. Wound appears to be healing well. No purulence or erythema to the surgical wound. Some TTP to posterior medial aspect of knee/proximal thigh with ecchymosis. NV intact. Discussed pt with colleague of Dr. Berenice Primas, who agrees with DVT study. Vascular U/S is not currently available at this facility. Discussed recommendation with patient to transfer to [facility] or DVT study. Pt declines and states he will not go to another facility for this image. Discussed risks assoc with possibility of DVT, including PE and death. Pt signed out AMA. Verbalized understanding of risks.   Patient discussed with and seen by Dr. Winfred Leeds.  We discussed the nature and purpose, risks and benefits, as well as, the alternatives of treatment. Time was given to allow the opportunity to ask questions and consider their options, and after the discussion, the patient decided to refuse the offerred treatment. The patient was informed that refusal could lead to, but was not limited to, death, permanent disability, or severe pain. If present, I asked the relatives or significant others to dissuade them without success. Prior to refusing, I determined that the patient had the capacity to make their decision and understood the consequences of that decision. After refusal, I made every reasonable opportunity to treat them to the best of my  ability.  The patient was notified that they may return to the emergency department at any time for further treatment.      Final Clinical Impressions(s) / ED Diagnoses   Final diagnoses:  Post-operative pain    ED Discharge Orders    None       Julia Alkhatib, [place] N, PA-C 11/24/18 1623    Orlie Dakin, MD 11/24/18 [phone]

## 2018-11-24 NOTE — ED Triage Notes (Signed)
L knee replacement at [facility] on  Monday. Reports increase in pain, swelling and redness yesterday.

## 2018-11-24 NOTE — ED Notes (Signed)
Had left knee replacement on Monday  Yesterday started having swelling increased bruising and increased pain   Pt wants [place] to r/o clot

## 2018-11-24 NOTE — ED Provider Notes (Signed)
Patient had total knee replacement left knee 11/19/2018 he presents today with increased swelling and pain of his left knee posterior medial aspect.  Patient has been treated with Eliquis.  On exam left lower extremity with a well-healing surgical wound anteriorly.  Surrounding ecchymosis at the distal thigh.  There is also ecchymosis on medial aspect of heel.  He is mildly tender at posterior medial aspect of left distal thigh.  DP pulse 2+.  Good capillary refill   Orlie Dakin, MD 11/24/18 1525

## 2018-11-24 NOTE — ED Notes (Signed)
Pa talked to pt about the dangers of leaving [facility] and not going for [place]

## 2018-11-26 ENCOUNTER — Telehealth: Payer: Self-pay | Admitting: Family

## 2018-11-26 DIAGNOSIS — Q613 Polycystic kidney, unspecified: Secondary | ICD-10-CM | POA: Diagnosis not present

## 2018-11-26 DIAGNOSIS — Z96653 Presence of artificial knee joint, bilateral: Secondary | ICD-10-CM | POA: Diagnosis not present

## 2018-11-26 DIAGNOSIS — I10 Essential (primary) hypertension: Secondary | ICD-10-CM | POA: Diagnosis not present

## 2018-11-26 DIAGNOSIS — R69 Illness, unspecified: Secondary | ICD-10-CM | POA: Diagnosis not present

## 2018-11-26 DIAGNOSIS — Z471 Aftercare following joint replacement surgery: Secondary | ICD-10-CM | POA: Diagnosis not present

## 2018-11-26 DIAGNOSIS — K2270 Barrett's esophagus without dysplasia: Secondary | ICD-10-CM | POA: Diagnosis not present

## 2018-11-26 DIAGNOSIS — K219 Gastro-esophageal reflux disease without esophagitis: Secondary | ICD-10-CM | POA: Diagnosis not present

## 2018-11-26 DIAGNOSIS — E785 Hyperlipidemia, unspecified: Secondary | ICD-10-CM | POA: Diagnosis not present

## 2018-11-26 DIAGNOSIS — M1990 Unspecified osteoarthritis, unspecified site: Secondary | ICD-10-CM | POA: Diagnosis not present

## 2018-11-26 DIAGNOSIS — J45909 Unspecified asthma, uncomplicated: Secondary | ICD-10-CM | POA: Diagnosis not present

## 2018-11-26 NOTE — Telephone Encounter (Signed)
Attempted to notify pt and voicemail box is full. Mailed letter.

## 2018-11-26 NOTE — Telephone Encounter (Signed)
Please contact pt to arrange ED follow up with me.

## 2018-11-28 DIAGNOSIS — Z471 Aftercare following joint replacement surgery: Secondary | ICD-10-CM | POA: Diagnosis not present

## 2018-11-28 DIAGNOSIS — K219 Gastro-esophageal reflux disease without esophagitis: Secondary | ICD-10-CM | POA: Diagnosis not present

## 2018-11-28 DIAGNOSIS — R69 Illness, unspecified: Secondary | ICD-10-CM | POA: Diagnosis not present

## 2018-11-28 DIAGNOSIS — E785 Hyperlipidemia, unspecified: Secondary | ICD-10-CM | POA: Diagnosis not present

## 2018-11-28 DIAGNOSIS — J45909 Unspecified asthma, uncomplicated: Secondary | ICD-10-CM | POA: Diagnosis not present

## 2018-11-28 DIAGNOSIS — Z96653 Presence of artificial knee joint, bilateral: Secondary | ICD-10-CM | POA: Diagnosis not present

## 2018-11-28 DIAGNOSIS — I10 Essential (primary) hypertension: Secondary | ICD-10-CM | POA: Diagnosis not present

## 2018-11-28 DIAGNOSIS — Q613 Polycystic kidney, unspecified: Secondary | ICD-10-CM | POA: Diagnosis not present

## 2018-11-28 DIAGNOSIS — M1990 Unspecified osteoarthritis, unspecified site: Secondary | ICD-10-CM | POA: Diagnosis not present

## 2018-11-28 DIAGNOSIS — K2270 Barrett's esophagus without dysplasia: Secondary | ICD-10-CM | POA: Diagnosis not present

## 2018-11-29 ENCOUNTER — Ambulatory Visit: Payer: Self-pay

## 2018-11-29 ENCOUNTER — Encounter (HOSPITAL_BASED_OUTPATIENT_CLINIC_OR_DEPARTMENT_OTHER): Payer: Self-pay | Admitting: Emergency Medicine

## 2018-11-29 ENCOUNTER — Other Ambulatory Visit: Payer: Self-pay

## 2018-11-29 ENCOUNTER — Emergency Department (HOSPITAL_BASED_OUTPATIENT_CLINIC_OR_DEPARTMENT_OTHER)
Admission: EM | Admit: 2018-11-29 | Discharge: 2018-11-29 | Disposition: A | Discharge status: Home / Self Care | Payer: Medicare HMO | Attending: Emergency Medicine | Admitting: Emergency Medicine

## 2018-11-29 ENCOUNTER — Emergency Department (HOSPITAL_BASED_OUTPATIENT_CLINIC_OR_DEPARTMENT_OTHER): Payer: Medicare HMO

## 2018-11-29 DIAGNOSIS — Z79899 Other long term (current) drug therapy: Secondary | ICD-10-CM | POA: Diagnosis not present

## 2018-11-29 DIAGNOSIS — I10 Essential (primary) hypertension: Secondary | ICD-10-CM | POA: Diagnosis not present

## 2018-11-29 DIAGNOSIS — J45909 Unspecified asthma, uncomplicated: Secondary | ICD-10-CM | POA: Diagnosis not present

## 2018-11-29 DIAGNOSIS — E039 Hypothyroidism, unspecified: Secondary | ICD-10-CM | POA: Insufficient documentation

## 2018-11-29 DIAGNOSIS — Z96653 Presence of artificial knee joint, bilateral: Secondary | ICD-10-CM | POA: Diagnosis not present

## 2018-11-29 DIAGNOSIS — Z8673 Personal history of transient ischemic attack (TIA), and cerebral infarction without residual deficits: Secondary | ICD-10-CM | POA: Insufficient documentation

## 2018-11-29 DIAGNOSIS — R69 Illness, unspecified: Secondary | ICD-10-CM | POA: Diagnosis not present

## 2018-11-29 DIAGNOSIS — F17210 Nicotine dependence, cigarettes, uncomplicated: Secondary | ICD-10-CM | POA: Insufficient documentation

## 2018-11-29 DIAGNOSIS — R5383 Other fatigue: Secondary | ICD-10-CM | POA: Diagnosis not present

## 2018-11-29 DIAGNOSIS — N433 Hydrocele, unspecified: Secondary | ICD-10-CM | POA: Diagnosis not present

## 2018-11-29 DIAGNOSIS — N453 Epididymo-orchitis: Secondary | ICD-10-CM | POA: Insufficient documentation

## 2018-11-29 DIAGNOSIS — R300 Dysuria: Secondary | ICD-10-CM | POA: Diagnosis present

## 2018-11-29 DIAGNOSIS — Z7901 Long term (current) use of anticoagulants: Secondary | ICD-10-CM | POA: Insufficient documentation

## 2018-11-29 LAB — CBC WITH DIFFERENTIAL/PLATELET
ABS IMMATURE GRANULOCYTES: 0.33 10*3/uL — AB (ref 0.00–0.07)
BASOS ABS: 0 10*3/uL (ref 0.0–0.1)
BASOS PCT: 0 %
EOS ABS: 0.1 10*3/uL (ref 0.0–0.5)
Eosinophils Relative: 1 %
HCT: 39.8 % (ref 39.0–52.0)
Hemoglobin: 13.3 g/dL (ref 13.0–17.0)
IMMATURE GRANULOCYTES: 2 %
LYMPHS ABS: 1.1 10*3/uL (ref 0.7–4.0)
Lymphocytes Relative: 8 %
MCH: 29.6 pg (ref 26.0–34.0)
MCHC: 33.4 g/dL (ref 30.0–36.0)
MCV: 88.6 fL (ref 80.0–100.0)
MONOS PCT: 6 %
Monocytes Absolute: 0.9 10*3/uL (ref 0.1–1.0)
NEUTROS ABS: 12.4 10*3/uL — AB (ref 1.7–7.7)
NEUTROS PCT: 83 %
NRBC: 0 % (ref 0.0–0.2)
PLATELETS: 304 10*3/uL (ref 150–400)
RBC: 4.49 MIL/uL (ref 4.22–5.81)
RDW: 13.2 % (ref 11.5–15.5)
WBC: 14.9 10*3/uL — ABNORMAL HIGH (ref 4.0–10.5)

## 2018-11-29 LAB — URINALYSIS, ROUTINE W REFLEX MICROSCOPIC
Bilirubin Urine: NEGATIVE
Glucose, UA: NEGATIVE mg/dL
HGB URINE DIPSTICK: NEGATIVE
Ketones, ur: 15 mg/dL — AB
LEUKOCYTES UA: NEGATIVE
Nitrite: NEGATIVE
PROTEIN: NEGATIVE mg/dL
SPECIFIC GRAVITY, URINE: 1.02 (ref 1.005–1.030)
pH: 5 (ref 5.0–8.0)

## 2018-11-29 LAB — BASIC METABOLIC PANEL
ANION GAP: 9 (ref 5–15)
BUN: 13 mg/dL (ref 6–20)
CHLORIDE: 103 mmol/L (ref 98–111)
CO2: 22 mmol/L (ref 22–32)
Calcium: 8.7 mg/dL — ABNORMAL LOW (ref 8.9–10.3)
Creatinine, Ser: 0.7 mg/dL (ref 0.61–1.24)
GFR calc Af Amer: >60 mL/min (ref >60)
GFR calc non Af Amer: >60 mL/min (ref >60)
Glucose, Bld: 98 mg/dL (ref 70–99)
POTASSIUM: 3.3 mmol/L — AB (ref 3.5–5.1)
SODIUM: 134 mmol/L — AB (ref 135–145)

## 2018-11-29 MED ORDER — ONDANSETRON HCL 4 MG/2ML IJ SOLN
4.0000 mg | Freq: Once | INTRAMUSCULAR | Status: AC
  Administered 2018-11-29: 4 mg via INTRAVENOUS
  Filled 2018-11-29: qty 2

## 2018-11-29 MED ORDER — HYDROMORPHONE HCL 1 MG/ML IJ SOLN
1.0000 mg | Freq: Once | INTRAMUSCULAR | Status: AC
  Administered 2018-11-29: 1 mg via INTRAVENOUS
  Filled 2018-11-29: qty 1

## 2018-11-29 MED ORDER — SULFAMETHOXAZOLE-TRIMETHOPRIM 800-160 MG PO TABS: 1.0000 | ORAL_TABLET | Freq: Two times a day (BID) | ORAL | 0 refills | Status: AC

## 2018-11-29 MED ORDER — SULFAMETHOXAZOLE-TRIMETHOPRIM 800-160 MG PO TABS
1.0000 | ORAL_TABLET | Freq: Once | ORAL | Status: AC
  Administered 2018-11-29: 1 via ORAL
  Filled 2018-11-29: qty 1

## 2018-11-29 MED ORDER — ONDANSETRON 4 MG PO TBDP: 4.0000 mg | ORAL_TABLET | Freq: Three times a day (TID) | ORAL | 0 refills | Status: DC | PRN

## 2018-11-29 MED ORDER — SODIUM CHLORIDE 0.9 % IV BOLUS
1000.0000 mL | Freq: Once | INTRAVENOUS | Status: AC
  Administered 2018-11-29: 1000 mL via INTRAVENOUS

## 2018-11-29 NOTE — ED Triage Notes (Signed)
Pt states he had catheter inserted on 11-19-18.  Has been having dysuria for one week.  Strong urine smell per pt.  Pain to penis and testicles.  Unknown fever.

## 2018-11-29 NOTE — ED Provider Notes (Addendum)
[facility] EMERGENCY DEPARTMENT Provider Note   CSN: [id number] Arrival date & time: 11/29/18  1359     History   Chief Complaint Chief Complaint  Patient presents with  . Dysuria  . Groin Swelling    HPI Kyle Blair is a 57 y.o. male.  HPI   Kyle Blair is a 57 y.o. male, with a history of polycystic kidney disease, stroke, and asthma, presenting to the ED with dysuria beginning around November 28.  Accompanied by nausea, decreased appetite, and foul urine odor. Patient underwent left knee arthroplasty November 25 and had a Foley catheter in place during the surgery.  Catheter was removed the same day. Three days ago, patient also began to note right testicular pain and swelling.  This pain is sharp, 8/10, nonradiating. Patient states he is only sexually active with his wife. Denies difficulty urinating, fever, abdominal pain, pain with bowel movements, hematuria, penile discharge, back\flank pain, or any other complaints.     Past Medical History:  Diagnosis Date  . Arthritis    severe; pt takes strong pain meds daily/legs  . Asthma    MONTH AGO  . BPH (benign prostatic hypertrophy)   . Diverticulosis   . GERD with stricture   . Hiatal hernia   . History of stroke 2004  . Hyperlipidemia   . Hypertension   . Hypothyroid   . Lung nodule 02/03/2012  . Osteoarthritis of left knee   . Polycystic kidney disease   . Stroke ([place])    2004   EQULIBRIUM, AND JUDGING DISTANCE  . Tubular adenoma of colon     Patient Active Problem List   Diagnosis Date Noted  . Primary osteoarthritis of left knee 11/19/2018  . OSA (obstructive sleep apnea) 06/02/2018  . 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 Ingrown toenail 05/08/2018  . Onychomycosis 05/08/2018  . Stroke (cerebrum) ([place]) 01/03/2018  . Acute metabolic encephalopathy 47/82/9562  . Tobacco abuse 01/03/2018  . Acute respiratory failure ([place]) 01/19/2017  . Community acquired pneumonia 01/18/2017  .  Influenza with pneumonia 01/18/2017  . Indeterminate pulmonary nodules 07/23/2015  . Obese 05/20/2015  . Undiagnosed cardiac murmurs 04/15/2015  . Asthma with acute exacerbation 03/13/2014  . Hx of adenomatous colonic polyps 05/08/2013  . Barrett's esophagus 05/03/2013  . Chronic diarrhea 04/02/2013  . HTN (hypertension) 04/02/2013  . Hx of low back pain 09/26/2012  . Arthralgia of right knee 05/02/2012  . Asthma 12/13/2011  . Benign prostatic hyperplasia 12/13/2011  . Hyperlipidemia 12/13/2011  . GERD (gastroesophageal reflux disease) 12/13/2011  . Hypothyroid   . Polycystic kidney disease     Past Surgical History:  Procedure Laterality Date  . CARPAL TUNNEL RELEASE    . COLONOSCOPY    . HERNIA REPAIR  2005   with mesh  . KNEE ARTHROSCOPY Bilateral 12/05/13   cyst and bone spur removed from left knee per pt.  Marland Kitchen KNEE ARTHROSCOPY Left 01/2015  . NASAL POLYP EXCISION  1992 or 93  . POLYPECTOMY    . TOTAL KNEE ARTHROPLASTY Right 05/19/2015   Procedure: RIGHT TOTAL KNEE ARTHROPLASTY;  Surgeon: Paralee Cancel, MD;  Location: [facility];  Service: Orthopedics;  Laterality: Right;  . TOTAL KNEE ARTHROPLASTY Left 11/19/2018   Procedure: LEFT  TOTAL KNEE ARTHROPLASTY;  Surgeon: Dorna Leitz, MD;  Location: [facility];  Service: Orthopedics;  Laterality: Left;        Home Medications    Prior to Admission medications   Medication Sig Start Date End Date  Taking? Authorizing Provider  acetaminophen (TYLENOL) 325 MG tablet Take 1-2 tablets (325-650 mg total) by mouth every 6 (six) hours as needed for mild pain or moderate pain. 08/18/17   Domenic Moras, PA-C  ADVAIR DISKUS 250-50 MCG/DOSE AEPB USE ONE INHALATION TWICE A DAY. 11/12/18   [name], NP  albuterol (PROVENTIL) (2.5 MG/3ML) 0.083% nebulizer solution Take 3 mLs (2.5 mg total) by nebulization every 6 (six) hours as needed for wheezing or shortness of breath. 06/20/18   [name], NP  amLODipine (NORVASC) 10 MG tablet  Take 1 tablet (10 mg total) by mouth daily. 10/03/18   [name], NP  apixaban (ELIQUIS) 2.5 MG TABS tablet Take 1 tablet (2.5 mg total) by mouth 2 (two) times daily. 11/19/18   Gary Fleet, PA-C  benazepril (LOTENSIN) 10 MG tablet TAKE 1 TABLET(10 MG) BY MOUTH DAILY Patient taking differently: Take 10 mg by mouth daily. TAKE 1 TABLET(10 MG) BY MOUTH DAILY 09/14/15   [name], NP  Dextromethorphan-guaiFENesin (TUSSIN DM COUGH + CHEST) 10-200 MG/5ML LIQD Take 10 mLs by mouth at bedtime.    [provider]  docusate sodium (COLACE) 100 MG capsule Take 1 capsule (100 mg total) by mouth 2 (two) times daily. 11/19/18   Gary Fleet, PA-C  EVZIO 2 MG/0.4ML SOAJ Inject 2 mg into the muscle.  06/19/17   [provider]  fluticasone (FLOVENT HFA) 110 MCG/ACT inhaler 2 puffs twice daily when your asthma is not well controlled. Patient taking differently: Inhale 2 puffs into the lungs daily as needed (shortness of breath). 2 puffs twice daily when your asthma is not well controlled. 07/20/18   Shelda Pal, DO  furosemide (LASIX) 20 MG tablet Take 1 tablet (20 mg total) by mouth daily as needed. Patient taking differently: Take 20 mg by mouth daily as needed for fluid.  09/18/17   [name], NP  gabapentin (NEURONTIN) 300 MG capsule Take 300 mg by mouth 3 (three) times daily.  09/18/15   [provider]  HYDROmorphone (DILAUDID) 2 MG tablet Take 1-2 tablets (2-4 mg total) by mouth every 6 (six) hours as needed for severe pain. 11/19/18   Gary Fleet, PA-C  levothyroxine (SYNTHROID, LEVOTHROID) 200 MCG tablet TAKE ONE (1) [place] BREAKFAST Patient taking differently: Take 200 mcg by mouth daily before breakfast.  09/07/18   [name], NP  lovastatin (MEVACOR) 40 MG tablet TAKE ONE (1) TABLET BY MOUTH EACH DAY ATBEDTIME Patient taking differently: Take 40 mg by mouth daily.  02/28/18   [name], NP    montelukast (SINGULAIR) 10 MG tablet TAKE ONE TABLET EVERY MORNING Patient taking differently: Take 10 mg by mouth daily. TAKE ONE TABLET EVERY MORNING 12/01/17   [name], NP  ondansetron (ZOFRAN ODT) 4 MG disintegrating tablet Take 1 tablet (4 mg total) by mouth every 8 (eight) hours as needed for nausea or vomiting. 11/29/18   Joy, Shawn C, PA-C  oxyCODONE-acetaminophen (PERCOCET) 7.5-325 MG tablet Take 1 tablet by mouth every 8 (eight) hours as needed for moderate pain.  08/11/18   [provider]  pantoprazole (PROTONIX) 40 MG tablet TAKE ONE (1) TABLET BY MOUTH EVERY DAY BEFORE BREAKFAST ON AN EMPTY STOMACH Patient taking differently: Take 40 mg by mouth daily.  09/06/18   Pyrtle, Lajuan Lines, MD  sulfamethoxazole-trimethoprim (BACTRIM DS,SEPTRA DS) 800-160 MG tablet Take 1 tablet by mouth 2 (two) times daily for 10 days. 11/29/18 12/09/18  Joy, Raquel Sarna C, PA-C  tamsulosin (FLOMAX)  0.4 MG CAPS capsule TAKE ONE (1) CAPSULE EACH DAY BY MOUTH Patient taking differently: Take 0.4 mg by mouth daily.  10/04/18   [name], NP  tiZANidine (ZANAFLEX) 2 MG tablet Take 1 tablet (2 mg total) by mouth every 8 (eight) hours as needed for muscle spasms. 11/19/18   Gary Fleet, PA-C  VENTOLIN HFA 108 (90 Base) MCG/ACT inhaler INHALE 2 PUFFS INTO THE LUNGS EVERY 4 HOURS AS NEEDED FOR WHEEZING ORSHORTNESS OF BREATH Patient taking differently: Inhale 1 puff into the lungs 2 (two) times daily.  06/15/18   [name], NP  XTAMPZA ER 18 MG C12A Take 18 mg by mouth 2 (two) times daily.  08/11/18   [provider]    Family History Family History  Problem Relation Age of Onset  . Heart disease Mother        pacemaker  . Diabetes Father   . Kidney disease Father   . Diabetes Sister   . Kidney disease Sister   . Colon cancer Neg Hx   . Esophageal cancer Neg Hx   . Rectal cancer Neg Hx   . Stomach cancer Neg Hx   . Prostate cancer Neg Hx   . Pancreatic cancer Neg Hx      Social History Social History   Tobacco Use  . Smoking status: Current Every Day Smoker    Packs/day: 0.50    Years: 20.00    Pack years: 10.00    Types: Cigarettes  . Smokeless tobacco: Never Used  . Tobacco comment: 5-6 cigarettes a day  Substance Use Topics  . Alcohol use: No    Alcohol/week: 0.0 standard drinks  . Drug use: No     Allergies   Aspirin; Doxycycline hyclate; Ibuprofen; Jynarque [tolvaptan]; Peanut butter flavor; and Cephalexin   Review of Systems Review of Systems  Constitutional: Positive for appetite change and fatigue. Negative for chills and fever.  Respiratory: Negative for cough and shortness of breath.   Cardiovascular: Negative for chest pain and leg swelling.  Gastrointestinal: Positive for nausea. Negative for abdominal pain, blood in stool, diarrhea and vomiting.  Genitourinary: Positive for dysuria, scrotal swelling and testicular pain. Negative for difficulty urinating, discharge and hematuria.  Neurological: Negative for dizziness and light-headedness.  All other systems reviewed and are negative.    Physical Exam Updated Vital Signs BP 134/83   Pulse 88   Temp 98.5 F (36.9 C)   Resp 16   Ht 5\' 6"  (1.676 m)   Wt 97.5 kg   SpO2 98%   BMI 34.70 kg/m   Physical Exam  Constitutional: He appears well-developed and well-nourished. No distress.  HENT:  Head: Normocephalic and atraumatic.  Eyes: Conjunctivae are normal.  Neck: Neck supple.  Cardiovascular: Normal rate, regular rhythm and intact distal pulses.  Pulmonary/Chest: Effort normal. No respiratory distress.  Abdominal: Soft. There is no tenderness. There is no guarding.  Genitourinary:  Genitourinary Comments: Swelling and firmness to the right testicle.  Tender and erythematous right hemiscrotum. No abnormalities noted to the left testicle and hemiscrotum. No penile discharge or tenderness noted.  No genital lesions.  No noted lymphadenopathy.  No perineal tenderness.   Musculoskeletal: He exhibits no edema.  Lymphadenopathy:    He has no cervical adenopathy.  Neurological: He is alert.  Skin: Skin is warm and dry. He is not diaphoretic.  Psychiatric: He has a normal mood and affect. His behavior is normal.  Nursing note and vitals reviewed.    ED Treatments /  Results  Labs (all labs ordered are listed, but only abnormal results are displayed) Labs Reviewed  URINALYSIS, ROUTINE W REFLEX MICROSCOPIC - Abnormal; Notable for the following components:      Result Value   Ketones, ur 15 (*)    All other components within normal limits  BASIC METABOLIC PANEL - Abnormal; Notable for the following components:   Sodium 134 (*)    Potassium 3.3 (*)    Calcium 8.7 (*)    All other components within normal limits  CBC WITH DIFFERENTIAL/PLATELET - Abnormal; Notable for the following components:   WBC 14.9 (*)    Neutro Abs 12.4 (*)    Abs Immature Granulocytes 0.33 (*)    All other components within normal limits  URINE CULTURE  GC/CHLAMYDIA PROBE AMP ([place]) NOT AT [facility]    EKG None  Radiology US Scrotum W/doppler  Result Date: 11/29/2018 CLINICAL DATA:  Right scrotal pain and swelling for 3-4 days. EXAM: SCROTAL ULTRASOUND DOPPLER ULTRASOUND OF THE TESTICLES TECHNIQUE: Complete ultrasound examination of the testicles, epididymis, and other scrotal structures was performed. Color and spectral Doppler ultrasound were also utilized to evaluate blood flow to the testicles. COMPARISON:  None. FINDINGS: Right testicle Measurements: 5.2 x 2.9 x 3.8 cm. Symmetric and homogeneous echotexture without focal lesion. Patent arterial and venous blood flow. Overall increased blood flow compared to the left testicle suggesting orchitis. Left testicle Measurements: 5.3 x 2.3 x 3.5 cm. Symmetric and homogeneous echotexture without focal lesion. Patent arterial and venous blood flow. Right epididymis: Thickened/enlarged epididymis with increased blood flow suggesting  epididymitis. Two epididymal cysts are also noted. Left epididymis:  Normal in size and appearance. Hydrocele:  Small right-sided hydrocele. Varicocele:  None visualized. Pulsed Doppler interrogation of both testes demonstrates normal low resistance arterial and venous waveforms bilaterally. IMPRESSION: 1. Sonographic findings consistent with right-sided epididymo-orchitis. 2. No worrisome testicular lesions. 3. Small right-sided hydrocele. Electronically Signed   By: Marijo Sanes M.D.   On: 11/29/2018 17:24    Procedures Procedures (including critical care time)  Medications Ordered in ED Medications  ondansetron (ZOFRAN) injection 4 mg (4 mg Intravenous Given 11/29/18 1537)  HYDROmorphone (DILAUDID) injection 1 mg (1 mg Intravenous Given 11/29/18 1538)  sodium chloride 0.9 % bolus 1,000 mL (0 mLs Intravenous Stopped 11/29/18 1749)  HYDROmorphone (DILAUDID) injection 1 mg (1 mg Intravenous Given 11/29/18 1831)  sulfamethoxazole-trimethoprim (BACTRIM DS,SEPTRA DS) 800-160 MG per tablet 1 tablet (1 tablet Oral Given 11/29/18 1825)     Initial Impression / Assessment and Plan / ED Course  I have reviewed the triage vital signs and the nursing notes.  Pertinent labs & imaging results that were available during my care of the patient were reviewed by me and considered in my medical decision making (see chart for details).  Clinical Course as of Nov 29 1899  Thu Nov 29, 2018  1630 Pain is improved to 6/10.   [SJ]    Clinical Course User Index [SJ] Joy, Shawn C, PA-C    Patient presents with dysuria, testicular pain and swelling.  Minor leukocytosis.  Patient is nontoxic appearing, afebrile, not tachycardic, not tachypneic, not hypotensive.  Abdominal exam benign.  Low suspicion for concurrent prostatitis.  Evidence of epididymo-orchitis on ultrasound.  We will initiate antibiotic therapy.  We will avoid fluoroquinolones due to patient's history of hypertension and stroke, in conjunction with FDA  warnings for these medications.  Urology follow-up.  Return precautions discussed.  Patient voices understanding of these instructions, accepts the plan,  and is comfortable with discharge.  Findings and plan of care discussed with Merrily Pew, MD.   Vitals:   11/29/18 1407 11/29/18 1748  BP: 134/83 121/78  Pulse: 88 86  Resp: 16 16  Temp: 98.5 F (36.9 C)   SpO2: 98% 97%  Weight: 97.5 kg   Height: 5\' 6"  (1.676 m)      Final Clinical Impressions(s) / ED Diagnoses   Final diagnoses:  Epididymo-orchitis    ED Discharge Orders         Ordered    sulfamethoxazole-trimethoprim (BACTRIM DS,SEPTRA DS) 800-160 MG tablet  2 times daily     11/29/18 1806    ondansetron (ZOFRAN ODT) 4 MG disintegrating tablet  Every 8 hours PRN     11/29/18 1806           Lorayne Bender, PA-C 11/29/18 1900    Lorayne Bender, PA-C 11/29/18 1901    Mesner, Corene Cornea, MD 11/30/18 [id number]

## 2018-11-29 NOTE — Telephone Encounter (Signed)
Incoming call from Patient, who states his scrotum is swollen to the size a pear.  States it rated severe.  Painful to touch.Reports that the is a horrible odor  to his his urine.  States he can't eat, everything taste the same and smells the same.  Per protocol Patient advised to go to ED or Urgent care. Patient states the he would go to [facility].  Provided care advice.  Voiced  Understanding.                                                                                                                                                                                                                                                Reason for Disposition . Scrotum is painful or tender to touch  Answer Assessment - Initial Assessment Questions 1. SCROTAL SWELLING: "What does the scrotum look like?" "How swollen is it?" (mild, moderate severe; compare to other side)     Good size pear  2. LOCATION: "Where is the swelling located?"      The whole srotal  3. ONSET: "When did the swelling start?"     2 days ago 4. PATTERN: "Does it come and go, or has it been constant since it started?"     Constant  5. SCROTAL PAIN: "Is there any pain?" If so, ask: "How bad is it?"  (Scale 1-10; or mild, moderate, severe)     severe 6. HERNIA: "Has a doctor ever told you that you have a hernia?"      Had hernia repairs before  7. OTHER SYMPTOMS: "Do you have any other symptoms?" (e.g., fever, abdominal pain, vomiting, difficulty passing urine)    Chills off and on Painful to to touch.  Protocols used: [facility]

## 2018-11-29 NOTE — ED Notes (Signed)
Pt returned from US

## 2018-11-29 NOTE — Discharge Instructions (Signed)
We will treat for possible infection to the testicle and surrounding tissues. Please take all of your antibiotics until finished!   You may develop abdominal discomfort or diarrhea from the antibiotic.  You may help offset this with probiotics which you can buy or get in yogurt. Do not eat or take the probiotics until 2 hours after your antibiotic.  May take previously prescribed pain medication. Apply ice to the area for no more than 15 minutes at a time.  Keep the scrotum and pelvis elevated, whenever possible.  Follow-up with the urologist on this matter.

## 2018-11-30 LAB — URINE CULTURE: CULTURE: NO GROWTH

## 2018-11-30 MED FILL — ONDANSETRON ODT 4 MG TABLET: 4 | 6 days supply | Qty: 20 | Fill #0

## 2018-11-30 MED FILL — SULFAMETHOXAZOLE-TMP DS TAB: 800-160 | 10 days supply | Qty: 20 | Fill #0

## 2018-12-03 ENCOUNTER — Ambulatory Visit: Payer: Medicare HMO | Attending: Orthopedic Surgery | Admitting: Physical Therapy

## 2018-12-03 DIAGNOSIS — Z471 Aftercare following joint replacement surgery: Secondary | ICD-10-CM | POA: Diagnosis not present

## 2018-12-03 DIAGNOSIS — M1712 Unilateral primary osteoarthritis, left knee: Secondary | ICD-10-CM | POA: Diagnosis not present

## 2018-12-03 DIAGNOSIS — Z96652 Presence of left artificial knee joint: Secondary | ICD-10-CM | POA: Diagnosis not present

## 2018-12-07 DIAGNOSIS — M25569 Pain in unspecified knee: Secondary | ICD-10-CM | POA: Diagnosis not present

## 2018-12-07 DIAGNOSIS — M179 Osteoarthritis of knee, unspecified: Secondary | ICD-10-CM | POA: Diagnosis not present

## 2018-12-07 DIAGNOSIS — Z79899 Other long term (current) drug therapy: Secondary | ICD-10-CM | POA: Diagnosis not present

## 2018-12-07 DIAGNOSIS — Z79891 Long term (current) use of opiate analgesic: Secondary | ICD-10-CM | POA: Diagnosis not present

## 2018-12-07 DIAGNOSIS — G894 Chronic pain syndrome: Secondary | ICD-10-CM | POA: Diagnosis not present

## 2018-12-11 DIAGNOSIS — I10 Essential (primary) hypertension: Secondary | ICD-10-CM | POA: Diagnosis not present

## 2018-12-11 DIAGNOSIS — M1990 Unspecified osteoarthritis, unspecified site: Secondary | ICD-10-CM | POA: Diagnosis not present

## 2018-12-11 DIAGNOSIS — R69 Illness, unspecified: Secondary | ICD-10-CM | POA: Diagnosis not present

## 2018-12-11 DIAGNOSIS — K219 Gastro-esophageal reflux disease without esophagitis: Secondary | ICD-10-CM | POA: Diagnosis not present

## 2018-12-11 DIAGNOSIS — Z96653 Presence of artificial knee joint, bilateral: Secondary | ICD-10-CM | POA: Diagnosis not present

## 2018-12-11 DIAGNOSIS — Q613 Polycystic kidney, unspecified: Secondary | ICD-10-CM | POA: Diagnosis not present

## 2018-12-11 DIAGNOSIS — K2270 Barrett's esophagus without dysplasia: Secondary | ICD-10-CM | POA: Diagnosis not present

## 2018-12-11 DIAGNOSIS — Z471 Aftercare following joint replacement surgery: Secondary | ICD-10-CM | POA: Diagnosis not present

## 2018-12-11 DIAGNOSIS — E785 Hyperlipidemia, unspecified: Secondary | ICD-10-CM | POA: Diagnosis not present

## 2018-12-11 DIAGNOSIS — J45909 Unspecified asthma, uncomplicated: Secondary | ICD-10-CM | POA: Diagnosis not present

## 2018-12-17 ENCOUNTER — Ambulatory Visit: Payer: Medicare HMO | Admitting: Physical Therapy

## 2018-12-20 ENCOUNTER — Telehealth: Payer: Self-pay | Admitting: *Deleted

## 2018-12-20 NOTE — Telephone Encounter (Signed)
Received Home Health Discharge from [facility]; forwarded to provider/SLS 12/26

## 2018-12-22 ENCOUNTER — Other Ambulatory Visit: Payer: Self-pay | Admitting: Family

## 2018-12-23 NOTE — Telephone Encounter (Signed)
Please make sure the pt know that this is "as needed" and not scheduled to take on a daily basis. If he is requiring it on a daily basis, he needs to be seen as his asthma would not be adequately controlled in that case. TY.

## 2018-12-31 DIAGNOSIS — Z471 Aftercare following joint replacement surgery: Secondary | ICD-10-CM | POA: Diagnosis not present

## 2019-01-04 DIAGNOSIS — Z79899 Other long term (current) drug therapy: Secondary | ICD-10-CM | POA: Diagnosis not present

## 2019-01-04 DIAGNOSIS — M545 Low back pain: Secondary | ICD-10-CM | POA: Diagnosis not present

## 2019-01-04 DIAGNOSIS — G894 Chronic pain syndrome: Secondary | ICD-10-CM | POA: Diagnosis not present

## 2019-01-04 DIAGNOSIS — Z79891 Long term (current) use of opiate analgesic: Secondary | ICD-10-CM | POA: Diagnosis not present

## 2019-01-04 DIAGNOSIS — M179 Osteoarthritis of knee, unspecified: Secondary | ICD-10-CM | POA: Diagnosis not present

## 2019-01-04 DIAGNOSIS — E669 Obesity, unspecified: Secondary | ICD-10-CM | POA: Diagnosis not present

## 2019-01-15 DIAGNOSIS — T8482XA Fibrosis due to internal orthopedic prosthetic devices, implants and grafts, initial encounter: Secondary | ICD-10-CM | POA: Diagnosis not present

## 2019-01-16 ENCOUNTER — Encounter: Payer: Self-pay | Admitting: Family

## 2019-01-16 ENCOUNTER — Ambulatory Visit (INDEPENDENT_AMBULATORY_CARE_PROVIDER_SITE_OTHER): Payer: Medicare HMO | Admitting: Family

## 2019-01-16 ENCOUNTER — Other Ambulatory Visit: Payer: Self-pay | Admitting: Family

## 2019-01-16 VITALS — BP 134/87 | HR 89 | Temp 98.7°F | Resp 16 | Ht 66.0 in | Wt 220.0 lb

## 2019-01-16 DIAGNOSIS — J45901 Unspecified asthma with (acute) exacerbation: Secondary | ICD-10-CM

## 2019-01-16 DIAGNOSIS — B370 Candidal stomatitis: Secondary | ICD-10-CM | POA: Diagnosis not present

## 2019-01-16 MED ORDER — NYSTATIN 100000 UNIT/ML MT SUSP: 5.0000 mL | Freq: Four times a day (QID) | OROMUCOSAL | 0 refills | Status: DC

## 2019-01-16 MED ORDER — PREDNISONE 10 MG PO TABS: ORAL_TABLET | ORAL | 0 refills | Status: DC

## 2019-01-16 MED ORDER — METHYLPREDNISOLONE SODIUM SUCC 125 MG IJ SOLR
125.0000 mg | Freq: Once | INTRAMUSCULAR | Status: AC
  Administered 2019-01-16: 125 mg via INTRAMUSCULAR

## 2019-01-16 MED ORDER — AZITHROMYCIN 250 MG PO TABS: ORAL_TABLET | ORAL | 0 refills | Status: DC

## 2019-01-16 NOTE — Patient Instructions (Signed)
Start azithromycin (antibiotic) and prednisone taper for asthma. It is important that you quit smoking. Call if symptoms worsen or if not improved in 2-3 days. Go to ER if severe symptoms.  Start nystatin for thrush.

## 2019-01-16 NOTE — Progress Notes (Signed)
Subjective:    Patient ID: Kyle Blair, male    DOB: Oct 29, 1961, 58 y.o.   MRN: [id number]  HPI  Patient is a 58 yr old male who presents today with c/o tight chest wheezing.  No significant cough or rhinorrhea. Symptoms started Saturday.  Reports that he continues to smoke. Has had a small amount of sputum.   Review of Systems    see HPI  Past Medical History:  Diagnosis Date  . Arthritis    severe; pt takes strong pain meds daily/legs  . Asthma    MONTH AGO  . BPH (benign prostatic hypertrophy)   . Diverticulosis   . GERD with stricture   . Hiatal hernia   . History of stroke 2004  . Hyperlipidemia   . Hypertension   . Hypothyroid   . Lung nodule 02/03/2012  . Osteoarthritis of left knee   . Polycystic kidney disease   . Stroke ([place])    2004   EQULIBRIUM, AND JUDGING DISTANCE  . Tubular adenoma of colon      Social History   Socioeconomic History  . Marital status: Married    Spouse name: Not on file  . Number of children: 2  . Years of education: Not on file  . Highest education level: Not on file  Occupational History  . Occupation: DISABLED    Employer: UNEMPLOYED  Social Needs  . Financial resource strain: Not on file  . Food insecurity:    Worry: Not on file    Inability: Not on file  . Transportation needs:    Medical: Not on file    Non-medical: Not on file  Tobacco Use  . Smoking status: Current Every Day Smoker    Packs/day: 0.50    Years: 20.00    Pack years: 10.00    Types: Cigarettes  . Smokeless tobacco: Never Used  . Tobacco comment: 5-6 cigarettes a day  Substance and Sexual Activity  . Alcohol use: No    Alcohol/week: 0.0 standard drinks  . Drug use: No  . Sexual activity: Not on file  Lifestyle  . Physical activity:    Days per week: Not on file    Minutes per session: Not on file  . Stress: Not on file  Relationships  . Social connections:    Talks on phone: Not on file    Gets together: Not on file    Attends  religious service: Not on file    Active member of club or organization: Not on file    Attends meetings of clubs or organizations: Not on file    Relationship status: Not on file  . Intimate partner violence:    Fear of current or ex partner: Not on file    Emotionally abused: Not on file    Physically abused: Not on file    Forced sexual activity: Not on file  Other Topics Concern  . Not on file  Social History Narrative   Caffeine use:  1 pot coffee daily, 1 glass tea   Regular exercise:  No. Has active lifestyle.   2 biological and 1 stepson.   Former smoker- quit in 2007   He is on disability- reports that he has a history of heat stroke.               Past Surgical History:  Procedure Laterality Date  . CARPAL TUNNEL RELEASE    . COLONOSCOPY    . HERNIA REPAIR  2005  with mesh  . KNEE ARTHROSCOPY Bilateral 12/05/13   cyst and bone spur removed from left knee per pt.  Marland Kitchen KNEE ARTHROSCOPY Left 01/2015  . NASAL POLYP EXCISION  1992 or 93  . POLYPECTOMY    . TOTAL KNEE ARTHROPLASTY Right 05/19/2015   Procedure: RIGHT TOTAL KNEE ARTHROPLASTY;  Surgeon: Paralee Cancel, MD;  Location: [facility];  Service: Orthopedics;  Laterality: Right;  . TOTAL KNEE ARTHROPLASTY Left 11/19/2018   Procedure: LEFT  TOTAL KNEE ARTHROPLASTY;  Surgeon: Dorna Leitz, MD;  Location: [facility];  Service: Orthopedics;  Laterality: Left;    Family History  Problem Relation Age of Onset  . Heart disease Mother        pacemaker  . Diabetes Father   . Kidney disease Father   . Diabetes Sister   . Kidney disease Sister   . Colon cancer Neg Hx   . Esophageal cancer Neg Hx   . Rectal cancer Neg Hx   . Stomach cancer Neg Hx   . Prostate cancer Neg Hx   . Pancreatic cancer Neg Hx     Allergies  Allergen Reactions  . Aspirin Anaphylaxis  . Doxycycline Hyclate Swelling    Facial Swelling  . Ibuprofen Anaphylaxis  . Jynarque [Tolvaptan] Anaphylaxis  . Peanut Butter Flavor Anaphylaxis  . Cephalexin  Rash    Current Outpatient Medications on File Prior to Visit  Medication Sig Dispense Refill  . acetaminophen (TYLENOL) 325 MG tablet Take 1-2 tablets (325-650 mg total) by mouth every 6 (six) hours as needed for mild pain or moderate pain. 30 tablet 0  . ADVAIR DISKUS 250-50 MCG/DOSE AEPB USE ONE INHALATION TWICE A DAY. 60 each 5  . albuterol (PROVENTIL) (2.5 MG/3ML) 0.083% nebulizer solution Take 3 mLs (2.5 mg total) by nebulization every 6 (six) hours as needed for wheezing or shortness of breath. 75 mL 12  . amLODipine (NORVASC) 10 MG tablet Take 1 tablet (10 mg total) by mouth daily. 30 tablet 3  . apixaban (ELIQUIS) 2.5 MG TABS tablet Take 1 tablet (2.5 mg total) by mouth 2 (two) times daily. 42 tablet 0  . benazepril (LOTENSIN) 10 MG tablet TAKE 1 TABLET(10 MG) BY MOUTH DAILY (Patient taking differently: Take 10 mg by mouth daily. TAKE 1 TABLET(10 MG) BY MOUTH DAILY) 30 tablet 5  . Dextromethorphan-guaiFENesin (TUSSIN DM COUGH + CHEST) 10-200 MG/5ML LIQD Take 10 mLs by mouth at bedtime.    . docusate sodium (COLACE) 100 MG capsule Take 1 capsule (100 mg total) by mouth 2 (two) times daily. 30 capsule 0  . EVZIO 2 MG/0.4ML SOAJ Inject 2 mg into the muscle.   0  . furosemide (LASIX) 20 MG tablet Take 1 tablet (20 mg total) by mouth daily as needed. (Patient taking differently: Take 20 mg by mouth daily as needed for fluid. ) 30 tablet 5  . gabapentin (NEURONTIN) 300 MG capsule Take 300 mg by mouth 3 (three) times daily.   0  . HYDROmorphone (DILAUDID) 2 MG tablet Take 1-2 tablets (2-4 mg total) by mouth every 6 (six) hours as needed for severe pain. 30 tablet 0  . levothyroxine (SYNTHROID, LEVOTHROID) 200 MCG tablet TAKE ONE (1) TABLET BY MOUTH EACH DAY BEFORE BREAKFAST (Patient taking differently: Take 200 mcg by mouth daily before breakfast. ) 90 tablet 1  . lovastatin (MEVACOR) 40 MG tablet TAKE ONE (1) TABLET BY MOUTH EACH DAY ATBEDTIME (Patient taking differently: Take 40 mg by mouth  daily. ) 90 tablet 1  .  montelukast (SINGULAIR) 10 MG tablet TAKE ONE TABLET EVERY MORNING (Patient taking differently: Take 10 mg by mouth daily. TAKE ONE TABLET EVERY MORNING) 90 tablet 1  . ondansetron (ZOFRAN ODT) 4 MG disintegrating tablet Take 1 tablet (4 mg total) by mouth every 8 (eight) hours as needed for nausea or vomiting. 20 tablet 0  . oxyCODONE-acetaminophen (PERCOCET) 7.5-325 MG tablet Take 1 tablet by mouth every 8 (eight) hours as needed for moderate pain.     . pantoprazole (PROTONIX) 40 MG tablet TAKE ONE (1) TABLET BY MOUTH EVERY DAY BEFORE BREAKFAST ON AN EMPTY STOMACH (Patient taking differently: Take 40 mg by mouth daily. ) 90 tablet 3  . tamsulosin (FLOMAX) 0.4 MG CAPS capsule TAKE ONE (1) CAPSULE EACH DAY BY MOUTH (Patient taking differently: Take 0.4 mg by mouth daily. ) 90 capsule 1  . tiZANidine (ZANAFLEX) 2 MG tablet Take 1 tablet (2 mg total) by mouth every 8 (eight) hours as needed for muscle spasms. 40 tablet 0  . XTAMPZA ER 18 MG C12A Take 18 mg by mouth 2 (two) times daily.     . VENTOLIN HFA 108 (90 Base) MCG/ACT inhaler INHALE 2 PUFFS INTO THE LUNGS EVERY 4 HOURS AS NEEDED FOR WHEEZING ORSHORTNESS OF BREATH (Patient not taking: Reported on 01/16/2019) 18 g 5   Current Facility-Administered Medications on File Prior to Visit  Medication Dose Route Frequency Provider Last Rate Last Dose  . bupivacaine liposome (EXPAREL) 1.3 % injection 266 mg  20 mL Other Once Dorna Leitz, MD        BP 134/87 (BP Location: Left Arm, Patient Position: Sitting, Cuff Size: Small)   Pulse 89   Temp 98.7 F (37.1 C) (Oral)   Resp 16   Ht 5\' 6"  (1.676 m)   Wt 220 lb (99.8 kg)   SpO2 97%   BMI 35.51 kg/m    Objective:   Physical Exam Constitutional:      General: He is not in acute distress.    Appearance: He is well-developed.  HENT:     Head: Normocephalic and atraumatic.     Right Ear: Tympanic membrane and ear canal normal.     Left Ear: Tympanic membrane and ear  canal normal.     Mouth/Throat:     Comments: + thrush noted on tongue Cardiovascular:     Rate and Rhythm: Normal rate and regular rhythm.     Heart sounds: No murmur.  Pulmonary:     Effort: Pulmonary effort is normal. No respiratory distress.     Breath sounds: Wheezing present. No rales.  Skin:    General: Skin is warm and dry.  Neurological:     Mental Status: He is alert and oriented to person, place, and time.  Psychiatric:        Behavior: Behavior normal.        Thought Content: Thought content normal.           Assessment & Plan:  Acute asthma exacerbation- will rx with azithromycin. Solumedrol IM in office to be followed by prednisone taper. He will continue albuterol nebs at home. Discussed need to quit smoking.  Oral thrush- rx with nystatin.   Tobacco abuse- counseled on cessation.

## 2019-01-21 DIAGNOSIS — Z9889 Other specified postprocedural states: Secondary | ICD-10-CM | POA: Diagnosis not present

## 2019-01-21 DIAGNOSIS — M25662 Stiffness of left knee, not elsewhere classified: Secondary | ICD-10-CM | POA: Diagnosis not present

## 2019-01-21 DIAGNOSIS — T8482XA Fibrosis due to internal orthopedic prosthetic devices, implants and grafts, initial encounter: Secondary | ICD-10-CM | POA: Diagnosis not present

## 2019-01-21 DIAGNOSIS — M24662 Ankylosis, left knee: Secondary | ICD-10-CM | POA: Diagnosis not present

## 2019-01-22 ENCOUNTER — Ambulatory Visit: Payer: Medicare HMO | Attending: Orthopedic Surgery | Admitting: Physical Therapy

## 2019-01-22 ENCOUNTER — Encounter: Payer: Self-pay | Admitting: Physical Therapy

## 2019-01-22 ENCOUNTER — Other Ambulatory Visit: Payer: Self-pay

## 2019-01-22 DIAGNOSIS — R262 Difficulty in walking, not elsewhere classified: Secondary | ICD-10-CM

## 2019-01-22 DIAGNOSIS — M25662 Stiffness of left knee, not elsewhere classified: Secondary | ICD-10-CM

## 2019-01-22 DIAGNOSIS — M25562 Pain in left knee: Secondary | ICD-10-CM

## 2019-01-22 DIAGNOSIS — M25661 Stiffness of right knee, not elsewhere classified: Secondary | ICD-10-CM | POA: Diagnosis not present

## 2019-01-22 DIAGNOSIS — R29898 Other symptoms and signs involving the musculoskeletal system: Secondary | ICD-10-CM | POA: Diagnosis not present

## 2019-01-22 NOTE — Therapy (Signed)
[facility] [address]  [place], [place], [address] Phone: [phone]   Fax:  [phone]  Physical Therapy Evaluation  Patient Details  Name: Kyle Blair MRN: [id number] Date of Birth: [birth year] Referring Provider (PT): Dorna Leitz, MD   Encounter Date: 01/22/2019  PT End of Session - 01/22/19 1552    Visit Number  1    Number of Visits  19    Date for PT Re-Evaluation  03/05/19    Authorization Type  [employer]    PT Start Time  1402    PT Stop Time  1446    PT Time Calculation (min)  44 min    Activity Tolerance  Patient tolerated treatment well;Patient limited by pain    Behavior During Therapy  [facility] for tasks assessed/performed       Past Medical History:  Diagnosis Date  . Arthritis    severe; pt takes strong pain meds daily/legs  . Asthma    MONTH AGO  . BPH (benign prostatic hypertrophy)   . Diverticulosis   . GERD with stricture   . Hiatal hernia   . History of stroke 2004  . Hyperlipidemia   . Hypertension   . Hypothyroid   . Lung nodule 02/03/2012  . Osteoarthritis of left knee   . Polycystic kidney disease   . Stroke ([place])    2004   EQULIBRIUM, AND JUDGING DISTANCE  . Tubular adenoma of colon     Past Surgical History:  Procedure Laterality Date  . CARPAL TUNNEL RELEASE    . COLONOSCOPY    . HERNIA REPAIR  2005   with mesh  . KNEE ARTHROSCOPY Bilateral 12/05/13   cyst and bone spur removed from left knee per pt.  Marland Kitchen KNEE ARTHROSCOPY Left 01/2015  . NASAL POLYP EXCISION  1992 or 93  . POLYPECTOMY    . TOTAL KNEE ARTHROPLASTY Right 05/19/2015   Procedure: RIGHT TOTAL KNEE ARTHROPLASTY;  Surgeon: Paralee Cancel, MD;  Location: [facility];  Service: Orthopedics;  Laterality: Right;  . TOTAL KNEE ARTHROPLASTY Left 11/19/2018   Procedure: LEFT  TOTAL KNEE ARTHROPLASTY;  Surgeon: Dorna Leitz, MD;  Location: [facility];  Service: Orthopedics;  Laterality: Left;    There were no vitals  filed for this visit.   Subjective Assessment - 01/22/19 1403    Subjective  Patient reports that he underwent L TKA on 11/20/19, had [place] for 3 weeks. Reports that he was happy with his progress at that time, but his knee stiffened up after he had an infection. Had manipulation under anesthesia to L knee on 01/21/19. Had been trying to stretch it himself yesterday. Currently using SPC with long distances, which he used at PLOF intermittently. Currently having issues with balance, bending his knee, prolonged walking. Notes he has had some soreness and stiffness in L knee since the procedure. Had stroke in 2004- notes no balance issue from this but equilibrium and depth perception was affected.    Pertinent History  stroke 2004, polycystic kidney disease, OS L knee, lung nodule, hypothyroid, HTN, HLD, hiatal hernia, GERD, diverticulosis, asthma, B TKA, carpal tunnel release    Limitations  Sitting;Lifting;Standing;Walking;House hold activities    How long can you sit comfortably?  unlimited    How long can you stand comfortably?  5 min    How long can you walk comfortably?  varies    Patient Stated Goals  bend my knee    Currently in  Pain?  Yes    Pain Score  0-No pain    Pain Location  Knee    Pain Orientation  Left;Medial    Pain Descriptors / Indicators  Sharp    Pain Type  Acute pain;Surgical pain         [facility] PT Assessment - 01/22/19 1414      Assessment   Medical Diagnosis  s/p R TKA     Referring Provider (PT)  Dorna Leitz, MD    Onset Date/Surgical Date  01/21/19    Prior Therapy  Yes- for R knee      Precautions   Precautions  None      Balance Screen   Has the patient fallen in the past 6 months  Yes    How many times?  2   no injuries   Has the patient had a decrease in activity level because of a fear of falling?   Yes    Is the patient reluctant to leave their home because of a fear of falling?   No      Home Social worker  Private residence     Living Arrangements  Spouse/significant other    Available Help at Discharge  Family    Type of [place] Access  Level entry    [place] - single point;Walker - 2 wheels;Walker - 4 wheels      Prior Function   Level of Independence  Independent    Vocation  On disability    Leisure  walking in the woods      Cognition   Overall Cognitive Status  Within Functional Limits for tasks assessed      Observation/Other Assessments   Observations  L knee with mild medial edema    Focus on Therapeutic Outcomes (FOTO)   Next session      Sensation   Light Touch  Appears Intact      Coordination   Gross Motor Movements are Fluid and Coordinated  Yes      Posture/Postural Control   Posture/Postural Control  Postural limitations    Postural Limitations  Rounded Shoulders;Forward head;Posterior pelvic tilt;Weight shift right      ROM / Strength   AROM / PROM / Strength  AROM;PROM;Strength      AROM   AROM Assessment Site  Knee    Right/Left Knee  Right;Left    Right Knee Extension  8    Right Knee Flexion  80    Left Knee Extension  9    Left Knee Flexion  54      PROM   PROM Assessment Site  Knee    Right/Left Knee  Right;Left    Right Knee Extension  7    Right Knee Flexion  85    Left Knee Extension  7    Left Knee Flexion  60      Strength   Strength Assessment Site  Hip;Knee;Ankle    Right/Left Hip  Right;Left    Right Hip Flexion  4+/5    Right Hip ABduction  4+/5    Right Hip ADduction  4+/5    Left Hip Flexion  4/5    Left Hip ABduction  4/5    Left Hip ADduction  4/5    Right/Left Knee  Right;Left    Right Knee Flexion  4+/5    Right Knee Extension  4/5    Left Knee Flexion  4/5    Left Knee Extension  4/5    Right/Left Ankle  Right;Left    Right Ankle Dorsiflexion  4+/5    Right Ankle Plantar Flexion  4+/5    Left Ankle Dorsiflexion  4+/5    Left Ankle Plantar Flexion  4+/5      Flexibility   Soft Tissue  Assessment /Muscle Length  yes    Hamstrings  mild tightness on L, moderate on R    Quadriceps  moderately tight on L      Palpation   Patella mobility  B patellae hypomobile in all directions- L worse than R    Palpation comment  mildly TTP in L medial knee      Ambulation/Gait   Assistive device  Straight cane    Gait Pattern  Step-through pattern;Step-to pattern;Decreased stance time - left;Decreased hip/knee flexion - left;Decreased weight shift to left;Trunk flexed;Poor foot clearance - left;Poor foot clearance - right    Ambulation Surface  Level;Indoor    Gait velocity  decreased                Objective measurements completed on examination: See above findings.              PT Education - 01/22/19 1552    Education Details  prognosis, POC, HEP    Person(s) Educated  Patient;Spouse    Methods  Explanation;Demonstration;Tactile cues;Verbal cues;Handout    Comprehension  Verbalized understanding;Returned demonstration       PT Short Term Goals - 01/22/19 1641      PT SHORT TERM GOAL #1   Title  Patient to be independent with initial HEP.    Time  3    Period  Weeks    Status  New    Target Date  02/12/19        PT Long Term Goals - 01/22/19 1641      PT LONG TERM GOAL #1   Title  Patient to be independent with advanced HEP.    Time  6    Period  Weeks    Status  New    Target Date  03/05/19      PT LONG TERM GOAL #2   Title  Patient to demonstrate B knee AROM/PROM 0-110 degrees.    Time  6    Period  Weeks    Status  New    Target Date  03/05/19      PT LONG TERM GOAL #3   Title  Patient to demonstrate B LE strength >=4+/5.    Time  6    Period  Weeks    Status  New    Target Date  03/05/19      PT LONG TERM GOAL #4   Title  Patient to score <14 sec on TUG with LRAD to decrease risk of falls.    Time  6    Period  Weeks    Status  New    Target Date  03/05/19      PT LONG TERM GOAL #5   Title  Patient to demonstrate  symmetrical weight shift, step length, and knee flexion throughout ambulation with LRAD.     Time  6    Period  Weeks    Status  New    Target Date  03/05/19             Plan - 01/22/19 1640    Clinical Impression  Statement  Patient is a 58y/o with hx of stroke presenting to OPPT with c/o L knee pain s/p L knee manipulation under anesthesia on 01/21/19. Reports initial L TKA was on 11/19/18. Since the manipulation, patient reports mild medial knee soreness and stiffness. Currently using SPC with long distances, which he used at PLOF intermittently. Currently having issues with balance, bending his knee, prolonged walking. Patient today with mild medial L knee edema, decreased ROM in B knees, B decreased patellar mobility, decreased flexibility, decreased LE strength, and gait deviations. Educated patient and wife on stretching and ROM HEP and administered handout. Patient's carryover of instruction limited- likely d/t cognitive deficits from stroke, however heavily emphasized need to perform HEP at counter for safety. Patient today also reporting that his wife will be leaving for [place] for a month, and will be staying home alone with no one to care for him. Reports he is scared of falling home alone. Wife confirms that there is no family/friends around to check up on the patient. Would benefit from skilled PT services 3x/week for 6 weeks to address aforementioned impairments.     Clinical Presentation  Stable    Clinical Decision Making  Low    Rehab Potential  Good    Clinical Impairments Affecting Rehab Potential  stroke 2004, polycystic kidney disease, OS L knee, lung nodule, hypothyroid, HTN, HLD, hiatal hernia, GERD, diverticulosis, asthma, B TKA, carpal tunnel release    PT Frequency  3x / week    PT Duration  6 weeks    PT Treatment/Interventions  ADLs/Self Care Home Management;Cryotherapy;Electrical Stimulation;Functional mobility training;Stair training;Gait training;DME  Instruction;Ultrasound;Moist Heat;Therapeutic activities;Therapeutic exercise;Balance training;Neuromuscular re-education;Patient/family education;Passive range of motion;Scar mobilization;Manual techniques;Dry needling;Energy conservation;Splinting;Taping;Vasopneumatic Device    PT Next Visit Plan  reassess HEP    Consulted and Agree with Plan of Care  Patient       Patient will benefit from skilled therapeutic intervention in order to improve the following deficits and impairments:  Hypomobility, Increased edema, Decreased scar mobility, Decreased activity tolerance, Decreased strength, Pain, Difficulty walking, Decreased balance, Decreased range of motion, Improper body mechanics, Postural dysfunction, Impaired flexibility  Visit Diagnosis: Acute pain of left knee  Stiffness of left knee, not elsewhere classified  Stiffness of right knee, not elsewhere classified  Difficulty in walking, not elsewhere classified  Other symptoms and signs involving the musculoskeletal system     Problem List Patient Active Problem List   Diagnosis Date Noted  . Primary osteoarthritis of left knee 11/19/2018  . OSA (obstructive sleep apnea) 06/02/2018  . 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 Ingrown toenail 05/08/2018  . Onychomycosis 05/08/2018  . Stroke (cerebrum) ([place]) 01/03/2018  . Acute metabolic encephalopathy 06/20/9484  . Tobacco abuse 01/03/2018  . Acute respiratory failure ([place]) 01/19/2017  . Community acquired pneumonia 01/18/2017  . Influenza with pneumonia 01/18/2017  . Indeterminate pulmonary nodules 07/23/2015  . Obese 05/20/2015  . Undiagnosed cardiac murmurs 04/15/2015  . Asthma with acute exacerbation 03/13/2014  . Hx of adenomatous colonic polyps 05/08/2013  . Barrett's esophagus 05/03/2013  . Chronic diarrhea 04/02/2013  . HTN (hypertension) 04/02/2013  . Hx of low back pain 09/26/2012  . Arthralgia of right knee 05/02/2012  . Asthma 12/13/2011  . Benign prostatic  hyperplasia 12/13/2011  . Hyperlipidemia 12/13/2011  . GERD (gastroesophageal reflux disease) 12/13/2011  . Hypothyroid   . Polycystic kidney disease     Janene Harvey, PT, DPT 01/22/19 4:44 PM   [facility] [address]  [place], [place], [address] Phone: [phone]   Fax:  [phone]  Name: LAMBERT JEANTY MRN: [id number] Date of Birth: [birth year]

## 2019-01-24 ENCOUNTER — Ambulatory Visit: Payer: Medicare HMO

## 2019-01-24 DIAGNOSIS — M25661 Stiffness of right knee, not elsewhere classified: Secondary | ICD-10-CM

## 2019-01-24 DIAGNOSIS — R29898 Other symptoms and signs involving the musculoskeletal system: Secondary | ICD-10-CM

## 2019-01-24 DIAGNOSIS — R262 Difficulty in walking, not elsewhere classified: Secondary | ICD-10-CM | POA: Diagnosis not present

## 2019-01-24 DIAGNOSIS — M25662 Stiffness of left knee, not elsewhere classified: Secondary | ICD-10-CM

## 2019-01-24 DIAGNOSIS — M25562 Pain in left knee: Secondary | ICD-10-CM

## 2019-01-24 NOTE — Therapy (Addendum)
[facility] [address]  [place], [place], [address] Phone: [phone]   Fax:  [phone]  Physical Therapy Treatment  Patient Details  Name: Kyle Blair MRN: [id number] Date of Birth: 04/26/1961 Referring Provider (PT): Dorna Leitz, MD   Encounter Date: 01/24/2019  PT End of Session - 01/24/19 1108    Visit Number  2    Number of Visits  19    Date for PT Re-Evaluation  03/05/19    Authorization Type  [employer]    PT Start Time  1103    PT Stop Time  1150    PT Time Calculation (min)  47 min    Activity Tolerance  Patient tolerated treatment well;Patient limited by pain    Behavior During Therapy  [facility] for tasks assessed/performed       Past Medical History:  Diagnosis Date  . Arthritis    severe; pt takes strong pain meds daily/legs  . Asthma    MONTH AGO  . BPH (benign prostatic hypertrophy)   . Diverticulosis   . GERD with stricture   . Hiatal hernia   . History of stroke 2004  . Hyperlipidemia   . Hypertension   . Hypothyroid   . Lung nodule 02/03/2012  . Osteoarthritis of left knee   . Polycystic kidney disease   . Stroke ([place])    2004   EQULIBRIUM, AND JUDGING DISTANCE  . Tubular adenoma of colon     Past Surgical History:  Procedure Laterality Date  . CARPAL TUNNEL RELEASE    . COLONOSCOPY    . HERNIA REPAIR  2005   with mesh  . KNEE ARTHROSCOPY Bilateral 12/05/13   cyst and bone spur removed from left knee per pt.  Marland Kitchen KNEE ARTHROSCOPY Left 01/2015  . NASAL POLYP EXCISION  1992 or 93  . POLYPECTOMY    . TOTAL KNEE ARTHROPLASTY Right 05/19/2015   Procedure: RIGHT TOTAL KNEE ARTHROPLASTY;  Surgeon: Paralee Cancel, MD;  Location: [facility];  Service: Orthopedics;  Laterality: Right;  . TOTAL KNEE ARTHROPLASTY Left 11/19/2018   Procedure: LEFT  TOTAL KNEE ARTHROPLASTY;  Surgeon: Dorna Leitz, MD;  Location: [facility];  Service: Orthopedics;  Laterality: Left;    There were no vitals  filed for this visit.  Subjective Assessment - 01/24/19 1109    Subjective  Reports he has been doing "most of the exercises".      Pertinent History  stroke 2004, polycystic kidney disease, OS L knee, lung nodule, hypothyroid, HTN, HLD, hiatal hernia, GERD, diverticulosis, asthma, B TKA, carpal tunnel release    Patient Stated Goals  bend my knee    Currently in Pain?  Yes    Pain Score  4     Pain Location  Knee    Pain Orientation  Left;Medial    Pain Descriptors / Indicators  Sharp    Pain Type  Acute pain;Surgical pain    Multiple Pain Sites  No         [facility] PT Assessment - 01/24/19 0001      Observation/Other Assessments   Focus on Therapeutic Outcomes (FOTO)   48% ( 52% limitation)      PROM   Right/Left Knee  Left    Left Knee Flexion  75   after patellar mobs, knee A/P mobs, LE stretches                   [facility] Adult PT Treatment/Exercise -  01/24/19 1208      Knee/Hip Exercises: Stretches   Passive Hamstring Stretch  Left;2 reps;30 seconds    Passive Hamstring Stretch Limitations  manual with therapist     Knee: Self-Stretch to increase Flexion  Left   5" x 10 reps; B chair UE support    Knee: Self-Stretch Limitations  standing lunge flexion stretch on stool       Knee/Hip Exercises: Standing   Functional Squat  15 reps;3 seconds    Functional Squat Limitations  Cues for upright posture       Knee/Hip Exercises: Seated   Heel Slides  Left;AAROM;10 reps    Heel Slides Limitations  with opposite LE end range flexion stretch x 5 sec      Knee/Hip Exercises: Supine   Straight Leg Raises  Left;15 reps    Straight Leg Raises Limitations  cues for quad set prior to each movement       Manual Therapy   Manual Therapy  Joint mobilization    Manual therapy comments  seated and supine     Joint Mobilization  L patellar mobs all directions (severly limited) for ROM; L knee A/P mobs for improved ROM seated with seat-belt assistance and gentle traction with  overpressure into flexion from therapist               PT Short Term Goals - 01/24/19 1108      PT SHORT TERM GOAL #1   Title  Patient to be independent with initial HEP.    Time  3    Period  Weeks    Status  On-going        PT Long Term Goals - 01/24/19 1109      PT LONG TERM GOAL #1   Title  Patient to be independent with advanced HEP.    Time  6    Period  Weeks    Status  On-going      PT LONG TERM GOAL #2   Title  Patient to demonstrate B knee AROM/PROM 0-110 degrees.    Time  6    Period  Weeks    Status  On-going      PT LONG TERM GOAL #3   Title  Patient to demonstrate B LE strength >=4+/5.    Time  6    Period  Weeks    Status  On-going      PT LONG TERM GOAL #4   Title  Patient to score <14 sec on TUG with LRAD to decrease risk of falls.    Time  6    Period  Weeks    Status  On-going      PT LONG TERM GOAL #5   Title  Patient to demonstrate symmetrical weight shift, step length, and knee flexion throughout ambulation with LRAD.     Time  6    Period  Weeks    Status  On-going            Plan - 01/24/19 1109    Clinical Impression Statement  [name] reporting he has been doing, "most" of the HEP activities over last few days.  Pt. hesitant today push into L knee flexion today due to complaint of pain and demonstrating muscular guarding with most activities.  Already demonstrating progress toward LTG #2 today measured L knee PROM flexion to 75 dg after heavy manual therapy addressing poor patellar mobility, A/P mobs with seat belt assistance, and LE stretching.  Pt. ended  visit declining ice reporting he plans to ice at home.      Rehab Potential  Good    Clinical Impairments Affecting Rehab Potential  stroke 2004, polycystic kidney disease, OS L knee, lung nodule, hypothyroid, HTN, HLD, hiatal hernia, GERD, diverticulosis, asthma, B TKA, carpal tunnel release    PT Frequency  3x / week    PT Duration  6 weeks    PT Treatment/Interventions   ADLs/Self Care Home Management;Cryotherapy;Electrical Stimulation;Functional mobility training;Stair training;Gait training;DME Instruction;Ultrasound;Moist Heat;Therapeutic activities;Therapeutic exercise;Balance training;Neuromuscular re-education;Patient/family education;Passive range of motion;Scar mobilization;Manual techniques;Dry needling;Energy conservation;Splinting;Taping;Vasopneumatic Device    PT Next Visit Plan  TUG    Consulted and Agree with Plan of Care  Patient       Patient will benefit from skilled therapeutic intervention in order to improve the following deficits and impairments:  Hypomobility, Increased edema, Decreased scar mobility, Decreased activity tolerance, Decreased strength, Pain, Difficulty walking, Decreased balance, Decreased range of motion, Improper body mechanics, Postural dysfunction, Impaired flexibility  Visit Diagnosis: Acute pain of left knee  Stiffness of left knee, not elsewhere classified  Stiffness of right knee, not elsewhere classified  Difficulty in walking, not elsewhere classified  Other symptoms and signs involving the musculoskeletal system     Problem List Patient Active Problem List   Diagnosis Date Noted  . Primary osteoarthritis of left knee 11/19/2018  . OSA (obstructive sleep apnea) 06/02/2018  . 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 Ingrown toenail 05/08/2018  . Onychomycosis 05/08/2018  . Stroke (cerebrum) ([place]) 01/03/2018  . Acute metabolic encephalopathy 16/60/6301  . Tobacco abuse 01/03/2018  . Acute respiratory failure ([place]) 01/19/2017  . Community acquired pneumonia 01/18/2017  . Influenza with pneumonia 01/18/2017  . Indeterminate pulmonary nodules 07/23/2015  . Obese 05/20/2015  . Undiagnosed cardiac murmurs 04/15/2015  . Asthma with acute exacerbation 03/13/2014  . Hx of adenomatous colonic polyps 05/08/2013  . Barrett's esophagus 05/03/2013  . Chronic diarrhea 04/02/2013  . HTN (hypertension) 04/02/2013   . Hx of low back pain 09/26/2012  . Arthralgia of right knee 05/02/2012  . Asthma 12/13/2011  . Benign prostatic hyperplasia 12/13/2011  . Hyperlipidemia 12/13/2011  . GERD (gastroesophageal reflux disease) 12/13/2011  . Hypothyroid   . Polycystic kidney disease     Bess Harvest, [place] 01/24/19 3:06 PM   [facility] [address]  [place], [place], [address] Phone: [phone]   Fax:  [phone]  Name: TYRAIL GRANDFIELD MRN: [id number] Date of Birth: [birth year]

## 2019-01-28 ENCOUNTER — Ambulatory Visit: Payer: Medicare HMO | Attending: Orthopedic Surgery

## 2019-01-28 DIAGNOSIS — M25562 Pain in left knee: Secondary | ICD-10-CM | POA: Insufficient documentation

## 2019-01-28 DIAGNOSIS — M25662 Stiffness of left knee, not elsewhere classified: Secondary | ICD-10-CM | POA: Diagnosis not present

## 2019-01-28 DIAGNOSIS — R262 Difficulty in walking, not elsewhere classified: Secondary | ICD-10-CM | POA: Insufficient documentation

## 2019-01-28 DIAGNOSIS — R29898 Other symptoms and signs involving the musculoskeletal system: Secondary | ICD-10-CM | POA: Insufficient documentation

## 2019-01-28 DIAGNOSIS — M25661 Stiffness of right knee, not elsewhere classified: Secondary | ICD-10-CM | POA: Insufficient documentation

## 2019-01-28 NOTE — Therapy (Signed)
[facility] [address]  [place], [place], [address] Phone: [phone]   Fax:  [phone]  Physical Therapy Treatment  Patient Details  Name: Kyle Blair MRN: [id number] Date of Birth: 01/26/1961 Referring Provider (PT): Dorna Leitz, MD   Encounter Date: 01/28/2019  PT End of Session - 01/28/19 1358    Visit Number  3    Number of Visits  19    Date for PT Re-Evaluation  03/05/19    Authorization Type  [employer] Medicare    PT Start Time  1320    PT Stop Time  3710   ended visit with 10 min ice pack to L knee   PT Time Calculation (min)  52 min    Activity Tolerance  Patient tolerated treatment well;Patient limited by pain    Behavior During Therapy  [facility] for tasks assessed/performed       Past Medical History:  Diagnosis Date  . Arthritis    severe; pt takes strong pain meds daily/legs  . Asthma    MONTH AGO  . BPH (benign prostatic hypertrophy)   . Diverticulosis   . GERD with stricture   . Hiatal hernia   . History of stroke 2004  . Hyperlipidemia   . Hypertension   . Hypothyroid   . Lung nodule 02/03/2012  . Osteoarthritis of left knee   . Polycystic kidney disease   . Stroke ([place])    2004   EQULIBRIUM, AND JUDGING DISTANCE  . Tubular adenoma of colon     Past Surgical History:  Procedure Laterality Date  . CARPAL TUNNEL RELEASE    . COLONOSCOPY    . HERNIA REPAIR  2005   with mesh  . KNEE ARTHROSCOPY Bilateral 12/05/13   cyst and bone spur removed from left knee per pt.  Marland Kitchen KNEE ARTHROSCOPY Left 01/2015  . NASAL POLYP EXCISION  1992 or 93  . POLYPECTOMY    . TOTAL KNEE ARTHROPLASTY Right 05/19/2015   Procedure: RIGHT TOTAL KNEE ARTHROPLASTY;  Surgeon: Paralee Cancel, MD;  [facility];  Service: Orthopedics;  Laterality: Right;  . TOTAL KNEE ARTHROPLASTY Left 11/19/2018   Procedure: LEFT  TOTAL KNEE ARTHROPLASTY;  Surgeon: Dorna Leitz, MD;  Location: [facility];  Service: Orthopedics;   Laterality: Left;    There were no vitals filed for this visit.  Subjective Assessment - 01/28/19 1329    Subjective  Pt. verbalizing that he has not been eating well over last few days.      Pertinent History  stroke 2004, polycystic kidney disease, OS L knee, lung nodule, hypothyroid, HTN, HLD, hiatal hernia, GERD, diverticulosis, asthma, B TKA, carpal tunnel release    Patient Stated Goals  bend my knee    Currently in Pain?  Yes    Pain Score  3     Pain Location  Knee    Pain Orientation  Left;Medial    Pain Descriptors / Indicators  Sharp    Pain Type  Acute pain;Surgical pain    Multiple Pain Sites  No                       [facility] Adult PT Treatment/Exercise - 01/28/19 0001      Knee/Hip Exercises: Stretches   Passive Hamstring Stretch  Left;2 reps;30 seconds    Passive Hamstring Stretch Limitations  Manual with therapist     Knee: Self-Stretch to increase Flexion  Left   5"  x 10 reps counter support with 6" step fleixon stretch    Knee: Self-Stretch Limitations  standing lunge flexion stretch step       Knee/Hip Exercises: Aerobic   Recumbent Bike  x 4 min - partial revolution for ROM     Nustep  3 min, lvl 3, (UE/LE)      Knee/Hip Exercises: Machines for Strengthening   Cybex Knee Flexion  B LE's: 15# x 10 rpes       Modalities   Modalities  Cryotherapy      Cryotherapy   Number Minutes Cryotherapy  10 Minutes    Cryotherapy Location  Knee   L knee   Type of Cryotherapy  Ice pack      Manual Therapy   Manual Therapy  Joint mobilization;Soft tissue mobilization;Passive ROM    Manual therapy comments  seated and supine     Joint Mobilization  L patellar mobs all directions (severly limited) for ROM; L knee A/P mobs for improved ROM seated with seat-belt assistance and gentle traction with overpressure into flexion from therapist    Soft tissue mobilization  scar massage with Free-up lotion x 2 min     Passive ROM  L knee flexion, extension stretch  with therapsit 5" x 10 reps each way              PT Education - 01/28/19 1851    Education Details  HEP update     Person(s) Educated  Patient    Methods  Explanation;Verbal cues;Handout;Demonstration    Comprehension  Verbalized understanding;Returned demonstration;Verbal cues required;Need further instruction       PT Short Term Goals - 01/24/19 1108      PT SHORT TERM GOAL #1   Title  Patient to be independent with initial HEP.    Time  3    Period  Weeks    Status  On-going        PT Long Term Goals - 01/24/19 1109      PT LONG TERM GOAL #1   Title  Patient to be independent with advanced HEP.    Time  6    Period  Weeks    Status  On-going      PT LONG TERM GOAL #2   Title  Patient to demonstrate B knee AROM/PROM 0-110 degrees.    Time  6    Period  Weeks    Status  On-going      PT LONG TERM GOAL #3   Title  Patient to demonstrate B LE strength >=4+/5.    Time  6    Period  Weeks    Status  On-going      PT LONG TERM GOAL #4   Title  Patient to score <14 sec on TUG with LRAD to decrease risk of falls.    Time  6    Period  Weeks    Status  On-going      PT LONG TERM GOAL #5   Title  Patient to demonstrate symmetrical weight shift, step length, and knee flexion throughout ambulation with LRAD.     Time  6    Period  Weeks    Status  On-going            Plan - 01/28/19 1359    Clinical Impression Statement  Kyle Blair reporting he has not eaten much over last few days due to poor appetite.  Did complain of poor energy levels today.  Tolerated all  ROM and HS strengthening activities well today.  Session with heavy manual therapy focus for hopeful improvement in L knee flexion/extension ROM.  Pt. strongly encouraged to perform HEP daily as he admits to poor HEP adherence today.  Ended visit with 10 min ice pack to L knee.  Pt. leaving session reporting L knee "feeling looser".    Clinical Impairments Affecting Rehab Potential  stroke 2004, polycystic  kidney disease, OS L knee, lung nodule, hypothyroid, HTN, HLD, hiatal hernia, GERD, diverticulosis, asthma, B TKA, carpal tunnel release    PT Treatment/Interventions  ADLs/Self Care Home Management;Cryotherapy;Electrical Stimulation;Functional mobility training;Stair training;Gait training;DME Instruction;Ultrasound;Moist Heat;Therapeutic activities;Therapeutic exercise;Balance training;Neuromuscular re-education;Patient/family education;Passive range of motion;Scar mobilization;Manual techniques;Dry needling;Energy conservation;Splinting;Taping;Vasopneumatic Device    PT Next Visit Plan  TUG    Consulted and Agree with Plan of Care  Patient       Patient will benefit from skilled therapeutic intervention in order to improve the following deficits and impairments:  Hypomobility, Increased edema, Decreased scar mobility, Decreased activity tolerance, Decreased strength, Pain, Difficulty walking, Decreased balance, Decreased range of motion, Improper body mechanics, Postural dysfunction, Impaired flexibility  Visit Diagnosis: Acute pain of left knee  Stiffness of left knee, not elsewhere classified  Stiffness of right knee, not elsewhere classified  Difficulty in walking, not elsewhere classified  Other symptoms and signs involving the musculoskeletal system     Problem List Patient Active Problem List   Diagnosis Date Noted  . Primary osteoarthritis of left knee 11/19/2018  . OSA (obstructive sleep apnea) 06/02/2018  . 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 Ingrown toenail 05/08/2018  . Onychomycosis 05/08/2018  . Stroke (cerebrum) ([place]) 01/03/2018  . Acute metabolic encephalopathy 46/27/0350  . Tobacco abuse 01/03/2018  . Acute respiratory failure ([place]) 01/19/2017  . Community acquired pneumonia 01/18/2017  . Influenza with pneumonia 01/18/2017  . Indeterminate pulmonary nodules 07/23/2015  . Obese 05/20/2015  . Undiagnosed cardiac murmurs 04/15/2015  . Asthma with acute  exacerbation 03/13/2014  . Hx of adenomatous colonic polyps 05/08/2013  . Barrett's esophagus 05/03/2013  . Chronic diarrhea 04/02/2013  . HTN (hypertension) 04/02/2013  . Hx of low back pain 09/26/2012  . Arthralgia of right knee 05/02/2012  . Asthma 12/13/2011  . Benign prostatic hyperplasia 12/13/2011  . Hyperlipidemia 12/13/2011  . GERD (gastroesophageal reflux disease) 12/13/2011  . Hypothyroid   . Polycystic kidney disease     Bess Harvest, [place] 01/28/19 6:55 PM   [facility] [address]  [place], [place], [address] Phone: [phone]   Fax:  [phone]  Name: Kyle Blair MRN: [id number] Date of Birth: [birth year]

## 2019-01-30 ENCOUNTER — Ambulatory Visit: Payer: Medicare HMO | Admitting: Physical Therapy

## 2019-01-30 DIAGNOSIS — Q446 Cystic disease of liver: Secondary | ICD-10-CM | POA: Diagnosis not present

## 2019-01-30 DIAGNOSIS — Q612 Polycystic kidney, adult type: Secondary | ICD-10-CM | POA: Diagnosis not present

## 2019-01-30 DIAGNOSIS — I129 Hypertensive chronic kidney disease with stage 1 through stage 4 chronic kidney disease, or unspecified chronic kidney disease: Secondary | ICD-10-CM | POA: Diagnosis not present

## 2019-01-30 DIAGNOSIS — N183 Chronic kidney disease, stage 3 (moderate): Secondary | ICD-10-CM | POA: Diagnosis not present

## 2019-02-01 ENCOUNTER — Ambulatory Visit: Payer: Medicare HMO | Admitting: Physical Therapy

## 2019-02-01 ENCOUNTER — Encounter: Payer: Self-pay | Admitting: Physical Therapy

## 2019-02-01 DIAGNOSIS — R29898 Other symptoms and signs involving the musculoskeletal system: Secondary | ICD-10-CM

## 2019-02-01 DIAGNOSIS — G894 Chronic pain syndrome: Secondary | ICD-10-CM | POA: Diagnosis not present

## 2019-02-01 DIAGNOSIS — R262 Difficulty in walking, not elsewhere classified: Secondary | ICD-10-CM

## 2019-02-01 DIAGNOSIS — M25661 Stiffness of right knee, not elsewhere classified: Secondary | ICD-10-CM

## 2019-02-01 DIAGNOSIS — Z79899 Other long term (current) drug therapy: Secondary | ICD-10-CM | POA: Diagnosis not present

## 2019-02-01 DIAGNOSIS — Z79891 Long term (current) use of opiate analgesic: Secondary | ICD-10-CM | POA: Diagnosis not present

## 2019-02-01 DIAGNOSIS — M25662 Stiffness of left knee, not elsewhere classified: Secondary | ICD-10-CM

## 2019-02-01 DIAGNOSIS — M179 Osteoarthritis of knee, unspecified: Secondary | ICD-10-CM | POA: Diagnosis not present

## 2019-02-01 DIAGNOSIS — M25562 Pain in left knee: Secondary | ICD-10-CM

## 2019-02-01 DIAGNOSIS — M25569 Pain in unspecified knee: Secondary | ICD-10-CM | POA: Diagnosis not present

## 2019-02-01 NOTE — Therapy (Signed)
[facility] [address]  [place], [place], [address] Phone: [phone]   Fax:  [phone]  Physical Therapy Treatment  Patient Details  Name: Kyle Blair MRN: [id number] Date of Birth: [birth year] Referring Provider (PT): Dorna Leitz, MD   Encounter Date: 02/01/2019  PT End of Session - 02/01/19 0838    Visit Number  4    Number of Visits  19    Date for PT Re-Evaluation  03/05/19    Authorization Type  [employer] Medicare    PT Start Time  [phone]    PT Stop Time  0840    PT Time Calculation (min)  45 min    Activity Tolerance  Patient tolerated treatment well;Patient limited by pain    Behavior During Therapy  [facility] for tasks assessed/performed       Past Medical History:  Diagnosis Date  . Arthritis    severe; pt takes strong pain meds daily/legs  . Asthma    MONTH AGO  . BPH (benign prostatic hypertrophy)   . Diverticulosis   . GERD with stricture   . Hiatal hernia   . History of stroke 2004  . Hyperlipidemia   . Hypertension   . Hypothyroid   . Lung nodule 02/03/2012  . Osteoarthritis of left knee   . Polycystic kidney disease   . Stroke ([place])    2004   EQULIBRIUM, AND JUDGING DISTANCE  . Tubular adenoma of colon     Past Surgical History:  Procedure Laterality Date  . CARPAL TUNNEL RELEASE    . COLONOSCOPY    . HERNIA REPAIR  2005   with mesh  . KNEE ARTHROSCOPY Bilateral 12/05/13   cyst and bone spur removed from left knee per pt.  Marland Kitchen KNEE ARTHROSCOPY Left 01/2015  . NASAL POLYP EXCISION  1992 or 93  . POLYPECTOMY    . TOTAL KNEE ARTHROPLASTY Right 05/19/2015   Procedure: RIGHT TOTAL KNEE ARTHROPLASTY;  Surgeon: Paralee Cancel, MD;  Location: [facility];  Service: Orthopedics;  Laterality: Right;  . TOTAL KNEE ARTHROPLASTY Left 11/19/2018   Procedure: LEFT  TOTAL KNEE ARTHROPLASTY;  Surgeon: Dorna Leitz, MD;  Location: [facility];  Service: Orthopedics;  Laterality: Left;    There were no vitals  filed for this visit.  Subjective Assessment - 02/01/19 0756    Subjective  Reports that he did not bring his cane today because he wants to get a cup of coffee on the way out. Reports performing HEP 1-2x/ day. Notices some improvement.     Pertinent History  stroke 2004, polycystic kidney disease, OS L knee, lung nodule, hypothyroid, HTN, HLD, hiatal hernia, GERD, diverticulosis, asthma, B TKA, carpal tunnel release    Patient Stated Goals  bend my knee    Pain Score  4     Pain Location  Knee    Pain Orientation  Left    Pain Descriptors / Indicators  Dull    Pain Type  Acute pain;Surgical pain         [facility] PT Assessment - 02/01/19 0001      AROM   Left Knee Extension  7    Left Knee Flexion  67      PROM   Left Knee Extension  5    Left Knee Flexion  77      Standardized Balance Assessment   Standardized Balance Assessment  Timed Up and Go Test      Timed Up  and Go Test   Normal TUG (seconds)  18.4   with SPC; 10.6 without AD                  [facility] Adult PT Treatment/Exercise - 02/01/19 0001      Knee/Hip Exercises: Stretches   Passive Hamstring Stretch  Left;2 reps;30 seconds    Passive Hamstring Stretch Limitations  sitting with L knee propped up on stool    Knee: Self-Stretch to increase Flexion  Left;3 reps;30 seconds    Knee: Self-Stretch Limitations  mod thomas with strap   cues to avoid valsalva     Knee/Hip Exercises: Aerobic   Recumbent Bike  L1 x 81min partial revolutions      Knee/Hip Exercises: Standing   Functional Squat  15 reps;3 seconds    Functional Squat Limitations  at counter top   cues to correct anterior trunk lean   Other Standing Knee Exercises  L knee flexion stretch at counter top on step 10x5" to tolerance   cues to lean hips forward rather than chest     Manual Therapy   Manual Therapy  Joint mobilization    Manual therapy comments  seated and supine     Joint Mobilization  L patellar mobs all directions grade IV to  tolerance- good improvement in all planes; L knee A/P mobs for improved ROM seated with seat-belt assistance and gentle traction with overpressure into flexion from therapist; supine L knee extension grade III mobs to tolerance with ankle on 1/2 bolster             PT Education - 02/01/19 0838    Education Details  edu on importance of using SPC to avoid falls    Person(s) Educated  Patient    Methods  Explanation;Demonstration    Comprehension  Verbalized understanding;Returned demonstration       PT Short Term Goals - 01/24/19 1108      PT SHORT TERM GOAL #1   Title  Patient to be independent with initial HEP.    Time  3    Period  Weeks    Status  On-going        PT Long Term Goals - 01/24/19 1109      PT LONG TERM GOAL #1   Title  Patient to be independent with advanced HEP.    Time  6    Period  Weeks    Status  On-going      PT LONG TERM GOAL #2   Title  Patient to demonstrate B knee AROM/PROM 0-110 degrees.    Time  6    Period  Weeks    Status  On-going      PT LONG TERM GOAL #3   Title  Patient to demonstrate B LE strength >=4+/5.    Time  6    Period  Weeks    Status  On-going      PT LONG TERM GOAL #4   Title  Patient to score <14 sec on TUG with LRAD to decrease risk of falls.    Time  6    Period  Weeks    Status  On-going      PT LONG TERM GOAL #5   Title  Patient to demonstrate symmetrical weight shift, step length, and knee flexion throughout ambulation with LRAD.     Time  6    Period  Weeks    Status  On-going  Plan - 02/01/19 0840    Clinical Impression Statement  Patient arrived to session, ambulating without AD, reporting that he did not bring it today d/t wanting to get a cup of coffee on the way out. Educated patient on importance of using AD to avoid falls. Patient reporting understanding. Patient scored 18.4 sec on TUG with SPC, 10.6 sec without AD. Patient still walking with limited L knee flexion and slightly  unsteady with turns. Tolerated manual therapy well today for improved knee extension and flexion. Good improvement in L patellar mobility noted with mobs. Able to reach 5-77 degrees of PROM in L knee after manual therapy. Reviewed sitting HS stretch as patient with confusion on this exercise. Ended session with no complaints, patient noting he would use ice pack at home.     Clinical Impairments Affecting Rehab Potential  stroke 2004, polycystic kidney disease, OS L knee, lung nodule, hypothyroid, HTN, HLD, hiatal hernia, GERD, diverticulosis, asthma, B TKA, carpal tunnel release    PT Treatment/Interventions  ADLs/Self Care Home Management;Cryotherapy;Electrical Stimulation;Functional mobility training;Stair training;Gait training;DME Instruction;Ultrasound;Moist Heat;Therapeutic activities;Therapeutic exercise;Balance training;Neuromuscular re-education;Patient/family education;Passive range of motion;Scar mobilization;Manual techniques;Dry needling;Energy conservation;Splinting;Taping;Vasopneumatic Device    PT Next Visit Plan  progress L knee ROM    Consulted and Agree with Plan of Care  Patient       Patient will benefit from skilled therapeutic intervention in order to improve the following deficits and impairments:  Hypomobility, Increased edema, Decreased scar mobility, Decreased activity tolerance, Decreased strength, Pain, Difficulty walking, Decreased balance, Decreased range of motion, Improper body mechanics, Postural dysfunction, Impaired flexibility  Visit Diagnosis: Acute pain of left knee  Stiffness of left knee, not elsewhere classified  Stiffness of right knee, not elsewhere classified  Difficulty in walking, not elsewhere classified  Other symptoms and signs involving the musculoskeletal system     Problem List Patient Active Problem List   Diagnosis Date Noted  . Primary osteoarthritis of left knee 11/19/2018  . OSA (obstructive sleep apnea) 06/02/2018  .  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 Ingrown toenail 05/08/2018  . Onychomycosis 05/08/2018  . Stroke (cerebrum) ([place]) 01/03/2018  . Acute metabolic encephalopathy 84/66/5993  . Tobacco abuse 01/03/2018  . Acute respiratory failure ([place]) 01/19/2017  . Community acquired pneumonia 01/18/2017  . Influenza with pneumonia 01/18/2017  . Indeterminate pulmonary nodules 07/23/2015  . Obese 05/20/2015  . Undiagnosed cardiac murmurs 04/15/2015  . Asthma with acute exacerbation 03/13/2014  . Hx of adenomatous colonic polyps 05/08/2013  . Barrett's esophagus 05/03/2013  . Chronic diarrhea 04/02/2013  . HTN (hypertension) 04/02/2013  . Hx of low back pain 09/26/2012  . Arthralgia of right knee 05/02/2012  . Asthma 12/13/2011  . Benign prostatic hyperplasia 12/13/2011  . Hyperlipidemia 12/13/2011  . GERD (gastroesophageal reflux disease) 12/13/2011  . Hypothyroid   . Polycystic kidney disease     Janene Harvey, PT, DPT 02/01/19 8:45 AM   [facility] [address]  [place], [place], [address] Phone: [phone]   Fax:  [phone]  Name: MICHA DOSANJH MRN: [id number] Date of Birth: 01/13/1961

## 2019-02-04 ENCOUNTER — Ambulatory Visit: Payer: Medicare HMO | Admitting: Physical Therapy

## 2019-02-04 ENCOUNTER — Encounter: Payer: Self-pay | Admitting: Physical Therapy

## 2019-02-04 DIAGNOSIS — M25562 Pain in left knee: Secondary | ICD-10-CM

## 2019-02-04 DIAGNOSIS — R29898 Other symptoms and signs involving the musculoskeletal system: Secondary | ICD-10-CM | POA: Diagnosis not present

## 2019-02-04 DIAGNOSIS — R262 Difficulty in walking, not elsewhere classified: Secondary | ICD-10-CM

## 2019-02-04 DIAGNOSIS — M25661 Stiffness of right knee, not elsewhere classified: Secondary | ICD-10-CM | POA: Diagnosis not present

## 2019-02-04 DIAGNOSIS — M25662 Stiffness of left knee, not elsewhere classified: Secondary | ICD-10-CM

## 2019-02-04 NOTE — Therapy (Signed)
[facility] [address]  [place], [place], [address] Phone: [phone]   Fax:  [phone]  Physical Therapy Treatment  Patient Details  Name: Kyle Blair MRN: [id number] Date of Birth: Mar 03, 1961 Referring Provider (PT): Dorna Leitz, MD   Encounter Date: 02/04/2019  PT End of Session - 02/04/19 1358    Visit Number  5    Number of Visits  19    Date for PT Re-Evaluation  03/05/19    Authorization Type  [employer] Medicare    PT Start Time  1315    PT Stop Time  1358    PT Time Calculation (min)  43 min    Activity Tolerance  Patient tolerated treatment well;Patient limited by pain    Behavior During Therapy  [facility] for tasks assessed/performed       Past Medical History:  Diagnosis Date  . Arthritis    severe; pt takes strong pain meds daily/legs  . Asthma    MONTH AGO  . BPH (benign prostatic hypertrophy)   . Diverticulosis   . GERD with stricture   . Hiatal hernia   . History of stroke 2004  . Hyperlipidemia   . Hypertension   . Hypothyroid   . Lung nodule 02/03/2012  . Osteoarthritis of left knee   . Polycystic kidney disease   . Stroke ([place])    2004   EQULIBRIUM, AND JUDGING DISTANCE  . Tubular adenoma of colon     Past Surgical History:  Procedure Laterality Date  . CARPAL TUNNEL RELEASE    . COLONOSCOPY    . HERNIA REPAIR  2005   with mesh  . KNEE ARTHROSCOPY Bilateral 12/05/13   cyst and bone spur removed from left knee per pt.  Marland Kitchen KNEE ARTHROSCOPY Left 01/2015  . NASAL POLYP EXCISION  1992 or 93  . POLYPECTOMY    . TOTAL KNEE ARTHROPLASTY Right 05/19/2015   Procedure: RIGHT TOTAL KNEE ARTHROPLASTY;  Surgeon: Paralee Cancel, MD;  [facility];  Service: Orthopedics;  Laterality: Right;  . TOTAL KNEE ARTHROPLASTY Left 11/19/2018   Procedure: LEFT  TOTAL KNEE ARTHROPLASTY;  Surgeon: Dorna Leitz, MD;  Location: [facility];  Service: Orthopedics;  Laterality: Left;    There were no vitals  filed for this visit.  Subjective Assessment - 02/04/19 1317    Subjective  Reports he had a lonely weekend- his wife is out of town. Had a lot of pain in L knee last night because he ran out of pain meds.     Pertinent History  stroke 2004, polycystic kidney disease, OS L knee, lung nodule, hypothyroid, HTN, HLD, hiatal hernia, GERD, diverticulosis, asthma, B TKA, carpal tunnel release    Patient Stated Goals  bend my knee    Currently in Pain?  Yes    Pain Score  4     Pain Location  Knee    Pain Orientation  Left    Pain Type  Acute pain;Surgical pain         [facility] PT Assessment - 02/04/19 0001      AROM   Left Knee Extension  5    Left Knee Flexion  74      PROM   Left Knee Extension  3    Left Knee Flexion  80                   [facility] Adult PT Treatment/Exercise - 02/04/19 0001  Knee/Hip Exercises: Stretches   Passive Hamstring Stretch  Left;2 reps;30 seconds    Passive Hamstring Stretch Limitations  supine with strap   cues to bring LE further up   Quad Stretch  Left;2 reps;30 seconds    Quad Stretch Limitations  prone; to tolerance    Hip Flexor Stretch  Left;2 reps;30 seconds    Hip Flexor Stretch Limitations  mod thomas with strap   cues to maintain back flat and contract abs to avoid LBP     Knee/Hip Exercises: Aerobic   Recumbent Bike  L1 x 30min partial revolutions      Knee/Hip Exercises: Standing   Terminal Knee Extension  Strengthening;Left;1 set;10 reps;Theraband    Theraband Level (Terminal Knee Extension)  Level 4 (Blue)    Terminal Knee Extension Limitations  10x3" with support on chair and wall   cues for TKE and chest upright   Functional Squat  15 reps;3 seconds    Functional Squat Limitations  at counter top   heavy cues for correction of anterior trunk lean     Knee/Hip Exercises: Supine   Quad Sets  Strengthening;Left;1 set;10 reps    Quad Sets Limitations  10x10" ankle on 1/2 bolster      Manual Therapy   Manual Therapy   Joint mobilization    Manual therapy comments  seated and supine     Joint Mobilization  L patellar mobs all directions grade IV to tolerance- good movement in all directions; L knee A/P mobs for improved ROM seated with seat-belt assistance and gentle traction with overpressure into flexion from therapist; supine L knee extension grade IV mobs to tolerance with ankle on 1/2 bolster             PT Education - 02/04/19 1358    Education Details  update to HEP    Person(s) Educated  Patient    Methods  Explanation;Demonstration;Tactile cues;Verbal cues;Handout    Comprehension  Verbalized understanding;Returned demonstration       PT Short Term Goals - 02/04/19 1510      PT SHORT TERM GOAL #1   Title  Patient to be independent with initial HEP.    Time  3    Period  Weeks    Status  Achieved        PT Long Term Goals - 01/24/19 1109      PT LONG TERM GOAL #1   Title  Patient to be independent with advanced HEP.    Time  6    Period  Weeks    Status  On-going      PT LONG TERM GOAL #2   Title  Patient to demonstrate B knee AROM/PROM 0-110 degrees.    Time  6    Period  Weeks    Status  On-going      PT LONG TERM GOAL #3   Title  Patient to demonstrate B LE strength >=4+/5.    Time  6    Period  Weeks    Status  On-going      PT LONG TERM GOAL #4   Title  Patient to score <14 sec on TUG with LRAD to decrease risk of falls.    Time  6    Period  Weeks    Status  On-going      PT LONG TERM GOAL #5   Title  Patient to demonstrate symmetrical weight shift, step length, and knee flexion throughout ambulation with LRAD.  Time  6    Period  Weeks    Status  On-going            Plan - 02/04/19 1358    Clinical Impression Statement  Patient arrived to session with report of increased L knee pain over the weekend d/t running out of pain meds. Ambulating today with SPC. Patient with observable improvement in L knee flexion during bike warm up. Tolerated  manual therapy to L knee for improvements in knee extension and flexion. Patient demonstrating good patellar mobility today. Able to reach L knee AROM 5-74 degrees and PROM 3-80 degrees after manual therapy. Updated HEP with prone quad stretch as this was well tolerated. Patient reported understanding. Required cues to correct anterior trunk lean with standing exercises. No complaints at end of session. Patient progressing well towards goals.     Clinical Impairments Affecting Rehab Potential  stroke 2004, polycystic kidney disease, OS L knee, lung nodule, hypothyroid, HTN, HLD, hiatal hernia, GERD, diverticulosis, asthma, B TKA, carpal tunnel release    PT Treatment/Interventions  ADLs/Self Care Home Management;Cryotherapy;Electrical Stimulation;Functional mobility training;Stair training;Gait training;DME Instruction;Ultrasound;Moist Heat;Therapeutic activities;Therapeutic exercise;Balance training;Neuromuscular re-education;Patient/family education;Passive range of motion;Scar mobilization;Manual techniques;Dry needling;Energy conservation;Splinting;Taping;Vasopneumatic Device    PT Next Visit Plan  progress L knee ROM    Consulted and Agree with Plan of Care  Patient       Patient will benefit from skilled therapeutic intervention in order to improve the following deficits and impairments:  Hypomobility, Increased edema, Decreased scar mobility, Decreased activity tolerance, Decreased strength, Pain, Difficulty walking, Decreased balance, Decreased range of motion, Improper body mechanics, Postural dysfunction, Impaired flexibility  Visit Diagnosis: Acute pain of left knee  Stiffness of left knee, not elsewhere classified  Stiffness of right knee, not elsewhere classified  Difficulty in walking, not elsewhere classified  Other symptoms and signs involving the musculoskeletal system     Problem List Patient Active Problem List   Diagnosis Date Noted  . Primary osteoarthritis of left  knee 11/19/2018  . OSA (obstructive sleep apnea) 06/02/2018  . 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 Ingrown toenail 05/08/2018  . Onychomycosis 05/08/2018  . Stroke (cerebrum) ([place]) 01/03/2018  . Acute metabolic encephalopathy 01/77/9390  . Tobacco abuse 01/03/2018  . Acute respiratory failure ([place]) 01/19/2017  . Community acquired pneumonia 01/18/2017  . Influenza with pneumonia 01/18/2017  . Indeterminate pulmonary nodules 07/23/2015  . Obese 05/20/2015  . Undiagnosed cardiac murmurs 04/15/2015  . Asthma with acute exacerbation 03/13/2014  . Hx of adenomatous colonic polyps 05/08/2013  . Barrett's esophagus 05/03/2013  . Chronic diarrhea 04/02/2013  . HTN (hypertension) 04/02/2013  . Hx of low back pain 09/26/2012  . Arthralgia of right knee 05/02/2012  . Asthma 12/13/2011  . Benign prostatic hyperplasia 12/13/2011  . Hyperlipidemia 12/13/2011  . GERD (gastroesophageal reflux disease) 12/13/2011  . Hypothyroid   . Polycystic kidney disease     Janene Harvey, PT, DPT 02/04/19 3:13 PM   [facility] [address]  [place], [place], [address] Phone: [phone]   Fax:  [phone]  Name: Kyle Blair MRN: [id number] Date of Birth: [birth year]

## 2019-02-06 ENCOUNTER — Ambulatory Visit: Payer: Medicare HMO

## 2019-02-06 DIAGNOSIS — M25662 Stiffness of left knee, not elsewhere classified: Secondary | ICD-10-CM | POA: Diagnosis not present

## 2019-02-06 DIAGNOSIS — R262 Difficulty in walking, not elsewhere classified: Secondary | ICD-10-CM | POA: Diagnosis not present

## 2019-02-06 DIAGNOSIS — M25661 Stiffness of right knee, not elsewhere classified: Secondary | ICD-10-CM

## 2019-02-06 DIAGNOSIS — M25562 Pain in left knee: Secondary | ICD-10-CM | POA: Diagnosis not present

## 2019-02-06 DIAGNOSIS — R29898 Other symptoms and signs involving the musculoskeletal system: Secondary | ICD-10-CM | POA: Diagnosis not present

## 2019-02-06 NOTE — Therapy (Signed)
[facility] [address]  [place], [place], [address] Phone: [phone]   Fax:  [phone]  Physical Therapy Treatment  Patient Details  Name: Kyle Blair MRN: [id number] Date of Birth: 1961/06/16 Referring Provider (PT): Dorna Leitz, MD   Encounter Date: 02/06/2019  PT End of Session - 02/06/19 1334    Visit Number  6    Number of Visits  19    Date for PT Re-Evaluation  03/05/19    Authorization Type  [employer]    PT Start Time  1315    PT Stop Time  1409    PT Time Calculation (min)  54 min    Activity Tolerance  Patient tolerated treatment well;Patient limited by pain    Behavior During Therapy  [facility] for tasks assessed/performed       Past Medical History:  Diagnosis Date  . Arthritis    severe; pt takes strong pain meds daily/legs  . Asthma    MONTH AGO  . BPH (benign prostatic hypertrophy)   . Diverticulosis   . GERD with stricture   . Hiatal hernia   . History of stroke 2004  . Hyperlipidemia   . Hypertension   . Hypothyroid   . Lung nodule 02/03/2012  . Osteoarthritis of left knee   . Polycystic kidney disease   . Stroke ([place])    2004   EQULIBRIUM, AND JUDGING DISTANCE  . Tubular adenoma of colon     Past Surgical History:  Procedure Laterality Date  . CARPAL TUNNEL RELEASE    . COLONOSCOPY    . HERNIA REPAIR  2005   with mesh  . KNEE ARTHROSCOPY Bilateral 12/05/13   cyst and bone spur removed from left knee per pt.  Marland Kitchen KNEE ARTHROSCOPY Left 01/2015  . NASAL POLYP EXCISION  1992 or 93  . POLYPECTOMY    . TOTAL KNEE ARTHROPLASTY Right 05/19/2015   Procedure: RIGHT TOTAL KNEE ARTHROPLASTY;  Surgeon: Paralee Cancel, MD;  Location: [facility];  Service: Orthopedics;  Laterality: Right;  . TOTAL KNEE ARTHROPLASTY Left 11/19/2018   Procedure: LEFT  TOTAL KNEE ARTHROPLASTY;  Surgeon: Dorna Leitz, MD;  Location: [facility];  Service: Orthopedics;  Laterality: Left;    There were no vitals  filed for this visit.  Subjective Assessment - 02/06/19 1333    Subjective  Reports he has been performing prone quad stretch at home.      Pertinent History  stroke 2004, polycystic kidney disease, OS L knee, lung nodule, hypothyroid, HTN, HLD, hiatal hernia, GERD, diverticulosis, asthma, B TKA, carpal tunnel release    Patient Stated Goals  bend my knee    Currently in Pain?  Yes    Pain Score  4     Pain Location  Knee    Pain Orientation  Left    Pain Descriptors / Indicators  Dull    Pain Type  Acute pain;Surgical pain    Multiple Pain Sites  No                       [facility] Adult PT Treatment/Exercise - 02/06/19 1346      Knee/Hip Exercises: Stretches   Gastroc Stretch  Left;1 rep;30 seconds    Gastroc Stretch Limitations  leaning into wall       Knee/Hip Exercises: Aerobic   Recumbent Bike  L1 x 69min partial revolutions      Knee/Hip Exercises: Standing   Heel  Raises  Both;15 reps    Heel Raises Limitations  + quad set     Wall Squat  3 seconds   x 12 reps   Wall Squat Limitations  guidance from therapist       Knee/Hip Exercises: Supine   Quad Sets  Strengthening;1 set;10 reps;Left    Quad Sets Limitations  5" x 10 reps with therapist overpressure into extension and heel proped on bolster     Bridges  Both;10 reps      Modalities   Modalities  Vasopneumatic      Vasopneumatic   Number Minutes Vasopneumatic   10 minutes    Vasopnuematic Location   Knee   L    Vasopneumatic Pressure  Low    Vasopneumatic Temperature   coldest temp      Manual Therapy   Manual Therapy  Joint mobilization    Manual therapy comments  seated and supine     Joint Mobilization  L patellar mobs all directions for improved ROM; L A/P mobs for improved ROM; L manual flexion/extension stretch with thepapist 5" x 10 reps each way     Passive ROM  L knee flexion extension stretching                PT Short Term Goals - 02/04/19 1510      PT SHORT TERM GOAL #1    Title  Patient to be independent with initial HEP.    Time  3    Period  Weeks    Status  Achieved        PT Long Term Goals - 01/24/19 1109      PT LONG TERM GOAL #1   Title  Patient to be independent with advanced HEP.    Time  6    Period  Weeks    Status  On-going      PT LONG TERM GOAL #2   Title  Patient to demonstrate B knee AROM/PROM 0-110 degrees.    Time  6    Period  Weeks    Status  On-going      PT LONG TERM GOAL #3   Title  Patient to demonstrate B LE strength >=4+/5.    Time  6    Period  Weeks    Status  On-going      PT LONG TERM GOAL #4   Title  Patient to score <14 sec on TUG with LRAD to decrease risk of falls.    Time  6    Period  Weeks    Status  On-going      PT LONG TERM GOAL #5   Title  Patient to demonstrate symmetrical weight shift, step length, and knee flexion throughout ambulation with LRAD.     Time  6    Period  Weeks    Status  On-going            Plan - 02/06/19 1334    Clinical Impression Statement  Kyle Blair tolerated all ROM and gentle strengthening activities well today.  Tolerated all strengthening and ROM activities with focus on functional knee flexion stretch well today.  ROM not formally measured today however L knee flexion ROM with visible improvement following heavy MT and quad contract relax stretching.  Ended visit with ice/compression to L knee to reduce post-exercise soreness and swelling.  Progressing well toward goals.      Clinical Impairments Affecting Rehab Potential  stroke 2004, polycystic kidney disease, OS L  knee, lung nodule, hypothyroid, HTN, HLD, hiatal hernia, GERD, diverticulosis, asthma, B TKA, carpal tunnel release    PT Treatment/Interventions  ADLs/Self Care Home Management;Cryotherapy;Electrical Stimulation;Functional mobility training;Stair training;Gait training;DME Instruction;Ultrasound;Moist Heat;Therapeutic activities;Therapeutic exercise;Balance training;Neuromuscular re-education;Patient/family  education;Passive range of motion;Scar mobilization;Manual techniques;Dry needling;Energy conservation;Splinting;Taping;Vasopneumatic Device    PT Next Visit Plan  progress L knee ROM    Consulted and Agree with Plan of Care  Patient       Patient will benefit from skilled therapeutic intervention in order to improve the following deficits and impairments:  Hypomobility, Increased edema, Decreased scar mobility, Decreased activity tolerance, Decreased strength, Pain, Difficulty walking, Decreased balance, Decreased range of motion, Improper body mechanics, Postural dysfunction, Impaired flexibility  Visit Diagnosis: Acute pain of left knee  Stiffness of left knee, not elsewhere classified  Stiffness of right knee, not elsewhere classified  Difficulty in walking, not elsewhere classified  Other symptoms and signs involving the musculoskeletal system     Problem List Patient Active Problem List   Diagnosis Date Noted  . Primary osteoarthritis of left knee 11/19/2018  . OSA (obstructive sleep apnea) 06/02/2018  . 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 Ingrown toenail 05/08/2018  . Onychomycosis 05/08/2018  . Stroke (cerebrum) ([place]) 01/03/2018  . Acute metabolic encephalopathy 11/57/2620  . Tobacco abuse 01/03/2018  . Acute respiratory failure ([place]) 01/19/2017  . Community acquired pneumonia 01/18/2017  . Influenza with pneumonia 01/18/2017  . Indeterminate pulmonary nodules 07/23/2015  . Obese 05/20/2015  . Undiagnosed cardiac murmurs 04/15/2015  . Asthma with acute exacerbation 03/13/2014  . Hx of adenomatous colonic polyps 05/08/2013  . Barrett's esophagus 05/03/2013  . Chronic diarrhea 04/02/2013  . HTN (hypertension) 04/02/2013  . Hx of low back pain 09/26/2012  . Arthralgia of right knee 05/02/2012  . Asthma 12/13/2011  . Benign prostatic hyperplasia 12/13/2011  . Hyperlipidemia 12/13/2011  . GERD (gastroesophageal reflux disease) 12/13/2011  . Hypothyroid   .  Polycystic kidney disease     Bess Harvest, [place] 02/06/19 4:55 PM    [facility] [address]  [place], [place], [address] Phone: [phone]   Fax:  [phone]  Name: Kyle Blair MRN: [id number] Date of Birth: [birth year]

## 2019-02-07 ENCOUNTER — Ambulatory Visit: Payer: Medicare HMO

## 2019-02-07 DIAGNOSIS — R262 Difficulty in walking, not elsewhere classified: Secondary | ICD-10-CM | POA: Diagnosis not present

## 2019-02-07 DIAGNOSIS — R29898 Other symptoms and signs involving the musculoskeletal system: Secondary | ICD-10-CM

## 2019-02-07 DIAGNOSIS — M25562 Pain in left knee: Secondary | ICD-10-CM

## 2019-02-07 DIAGNOSIS — M25662 Stiffness of left knee, not elsewhere classified: Secondary | ICD-10-CM | POA: Diagnosis not present

## 2019-02-07 DIAGNOSIS — M25661 Stiffness of right knee, not elsewhere classified: Secondary | ICD-10-CM | POA: Diagnosis not present

## 2019-02-07 NOTE — Therapy (Signed)
[facility] [address]  [place], [place], [address] Phone: [phone]   Fax:  [phone]  Physical Therapy Treatment  Patient Details  Name: Kyle Blair MRN: [id number] Date of Birth: 11-08-1961 Referring Provider (PT): Dorna Leitz, MD   Encounter Date: 02/07/2019  PT End of Session - 02/07/19 1108    Visit Number  7    Number of Visits  19    Date for PT Re-Evaluation  03/05/19    Authorization Type  [employer] Medicare    PT Start Time  1102    PT Stop Time  1152    PT Time Calculation (min)  50 min    Activity Tolerance  Patient tolerated treatment well;Patient limited by pain    Behavior During Therapy  [facility] for tasks assessed/performed       Past Medical History:  Diagnosis Date  . Arthritis    severe; pt takes strong pain meds daily/legs  . Asthma    MONTH AGO  . BPH (benign prostatic hypertrophy)   . Diverticulosis   . GERD with stricture   . Hiatal hernia   . History of stroke 2004  . Hyperlipidemia   . Hypertension   . Hypothyroid   . Lung nodule 02/03/2012  . Osteoarthritis of left knee   . Polycystic kidney disease   . Stroke ([place])    2004   EQULIBRIUM, AND JUDGING DISTANCE  . Tubular adenoma of colon     Past Surgical History:  Procedure Laterality Date  . CARPAL TUNNEL RELEASE    . COLONOSCOPY    . HERNIA REPAIR  2005   with mesh  . KNEE ARTHROSCOPY Bilateral 12/05/13   cyst and bone spur removed from left knee per pt.  Marland Kitchen KNEE ARTHROSCOPY Left 01/2015  . NASAL POLYP EXCISION  1992 or 93  . POLYPECTOMY    . TOTAL KNEE ARTHROPLASTY Right 05/19/2015   Procedure: RIGHT TOTAL KNEE ARTHROPLASTY;  Surgeon: Paralee Cancel, MD;  Location: [facility];  Service: Orthopedics;  Laterality: Right;  . TOTAL KNEE ARTHROPLASTY Left 11/19/2018   Procedure: LEFT  TOTAL KNEE ARTHROPLASTY;  Surgeon: Dorna Leitz, MD;  Location: [facility];  Service: Orthopedics;  Laterality: Left;    There were no vitals  filed for this visit.  Subjective Assessment - 02/07/19 1108    Subjective  Reports some soreness after yesterday's visit.      Pertinent History  stroke 2004, polycystic kidney disease, OS L knee, lung nodule, hypothyroid, HTN, HLD, hiatal hernia, GERD, diverticulosis, asthma, B TKA, carpal tunnel release    Patient Stated Goals  bend my knee    Currently in Pain?  No/denies    Pain Score  0-No pain    Multiple Pain Sites  No         [facility] PT Assessment - 02/07/19 0001      AROM   Left Knee Extension  5    Left Knee Flexion  79      PROM   Left Knee Extension  3    Left Knee Flexion  85      Strength   Strength Assessment Site  Hip;Knee    Right/Left Hip  Right;Left    Right Hip Flexion  4+/5    Right Hip Extension  4/5    Right Hip ABduction  4+/5    Right Hip ADduction  4+/5    Left Hip Flexion  4+/5    Left Hip  Extension  4/5    Left Hip ABduction  4+/5    Left Hip ADduction  4+/5    Right/Left Knee  Right;Left    Right Knee Flexion  4+/5    Right Knee Extension  4+/5    Left Knee Flexion  5/5    Left Knee Extension  4+/5      Standardized Balance Assessment   Standardized Balance Assessment  Timed Up and Go Test      Timed Up and Go Test   Normal TUG (seconds)  --   11.53 sec with SPC; 9.40 sec without AD                  [facility] Adult PT Treatment/Exercise - 02/07/19 1129      Ambulation/Gait   Ambulation/Gait  Yes    Ambulation/Gait Assistance  5: Supervision    Ambulation/Gait Assistance Details  Cues for even wt. shift and increased step length    Ambulation Distance (Feet)  50 Feet      Knee/Hip Exercises: Stretches   Sports administrator  Left;1 rep;30 seconds    Quad Stretch Limitations  prone; strap     Hip Flexor Stretch  Left;1 rep;30 seconds    Hip Flexor Stretch Limitations  strap     Knee: Self-Stretch to increase Flexion  Left   5" x 10 reps      Knee/Hip Exercises: Aerobic   Recumbent Bike  L1 x 62mn partial revolutions       Knee/Hip Exercises: Machines for Strengthening   Cybex Leg Press  B LE's: 25# x 15 reps       Knee/Hip Exercises: Standing   Functional Squat  15 reps;3 seconds    Functional Squat Limitations  TRX               PT Short Term Goals - 02/04/19 1510      PT SHORT TERM GOAL #1   Title  Patient to be independent with initial HEP.    Time  3    Period  Weeks    Status  Achieved        PT Long Term Goals - 02/07/19 1152      PT LONG TERM GOAL #1   Title  Patient to be independent with advanced HEP.    Time  6    Period  Weeks    Status  Partially Met      PT LONG TERM GOAL #2   Title  Patient to demonstrate B knee AROM/PROM 0-110 degrees.    Time  6    Period  Weeks    Status  On-going      PT LONG TERM GOAL #3   Title  Patient to demonstrate B LE strength >=4+/5.    Time  6    Period  Weeks    Status  Partially Met      PT LONG TERM GOAL #4   Title  Patient to score <14 sec on TUG with LRAD to decrease risk of falls.    Time  6    Period  Weeks    Status  Achieved      PT LONG TERM GOAL #5   Title  Patient to demonstrate symmetrical weight shift, step length, and knee flexion throughout ambulation with LRAD.     Time  6    Period  Weeks    Status  On-going            Plan -  02/07/19 1109    Clinical Impression Statement  Pt. doing well today and reports he has improved appetite with improved energy level lately.  Able to demo improved L knee ROM today AROM 5-79 dg, PROM 4-85 dg.  Able to partially achieve strength LTG with MMT today now only 4/5 with B hip extension strength.  Session with proximal hip strengthening to target this remaining deficit.  Session focusing on L knee extension and flexion ROM activities and heavy manual therapy focus for improved ROM.  Pt. tolerated all activities well.  Still ambulating with reduced L wt. shift and requiring cueing for upright posture however demonstrating improved L hip/knee flexion with swing phase now.   Able to achieve LTG #4 demonstrating TUG time < 14 sec with SPC and without AD today.  Progressing well toward goals.      Clinical Impairments Affecting Rehab Potential  stroke 2004, polycystic kidney disease, OS L knee, lung nodule, hypothyroid, HTN, HLD, hiatal hernia, GERD, diverticulosis, asthma, B TKA, carpal tunnel release    PT Treatment/Interventions  ADLs/Self Care Home Management;Cryotherapy;Electrical Stimulation;Functional mobility training;Stair training;Gait training;DME Instruction;Ultrasound;Moist Heat;Therapeutic activities;Therapeutic exercise;Balance training;Neuromuscular re-education;Patient/family education;Passive range of motion;Scar mobilization;Manual techniques;Dry needling;Energy conservation;Splinting;Taping;Vasopneumatic Device    PT Next Visit Plan  progress L knee ROM    Consulted and Agree with Plan of Care  Patient       Patient will benefit from skilled therapeutic intervention in order to improve the following deficits and impairments:  Hypomobility, Increased edema, Decreased scar mobility, Decreased activity tolerance, Decreased strength, Pain, Difficulty walking, Decreased balance, Decreased range of motion, Improper body mechanics, Postural dysfunction, Impaired flexibility  Visit Diagnosis: Acute pain of left knee  Stiffness of left knee, not elsewhere classified  Stiffness of right knee, not elsewhere classified  Difficulty in walking, not elsewhere classified  Other symptoms and signs involving the musculoskeletal system     Problem List Patient Active Problem List   Diagnosis Date Noted  . Primary osteoarthritis of left knee 11/19/2018  . OSA (obstructive sleep apnea) 06/02/2018  . 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 Ingrown toenail 05/08/2018  . Onychomycosis 05/08/2018  . Stroke (cerebrum) ([place]) 01/03/2018  . Acute metabolic encephalopathy 56/38/9373  . Tobacco abuse 01/03/2018  . Acute respiratory failure ([place]) 01/19/2017  .  Community acquired pneumonia 01/18/2017  . Influenza with pneumonia 01/18/2017  . Indeterminate pulmonary nodules 07/23/2015  . Obese 05/20/2015  . Undiagnosed cardiac murmurs 04/15/2015  . Asthma with acute exacerbation 03/13/2014  . Hx of adenomatous colonic polyps 05/08/2013  . Barrett's esophagus 05/03/2013  . Chronic diarrhea 04/02/2013  . HTN (hypertension) 04/02/2013  . Hx of low back pain 09/26/2012  . Arthralgia of right knee 05/02/2012  . Asthma 12/13/2011  . Benign prostatic hyperplasia 12/13/2011  . Hyperlipidemia 12/13/2011  . GERD (gastroesophageal reflux disease) 12/13/2011  . Hypothyroid   . Polycystic kidney disease     Bess Harvest, [place] 02/07/19 12:32 PM   [facility] [address]  [place], [place], [address] Phone: [phone]   Fax:  [phone]  Name: Kyle Blair MRN: [id number] Date of Birth: March 11, 1961

## 2019-02-08 DIAGNOSIS — Z9889 Other specified postprocedural states: Secondary | ICD-10-CM | POA: Diagnosis not present

## 2019-02-08 DIAGNOSIS — M25662 Stiffness of left knee, not elsewhere classified: Secondary | ICD-10-CM | POA: Diagnosis not present

## 2019-02-11 ENCOUNTER — Ambulatory Visit: Payer: Medicare HMO | Admitting: Physical Therapy

## 2019-02-11 ENCOUNTER — Encounter: Payer: Self-pay | Admitting: Physical Therapy

## 2019-02-11 DIAGNOSIS — R262 Difficulty in walking, not elsewhere classified: Secondary | ICD-10-CM | POA: Diagnosis not present

## 2019-02-11 DIAGNOSIS — M25662 Stiffness of left knee, not elsewhere classified: Secondary | ICD-10-CM

## 2019-02-11 DIAGNOSIS — M25562 Pain in left knee: Secondary | ICD-10-CM | POA: Diagnosis not present

## 2019-02-11 DIAGNOSIS — R29898 Other symptoms and signs involving the musculoskeletal system: Secondary | ICD-10-CM | POA: Diagnosis not present

## 2019-02-11 DIAGNOSIS — M25661 Stiffness of right knee, not elsewhere classified: Secondary | ICD-10-CM

## 2019-02-11 NOTE — Therapy (Signed)
[facility] [address]  [place], [place], [address] Phone: [phone]   Fax:  [phone]  Physical Therapy Treatment  Patient Details  Name: Kyle Blair MRN: [id number] Date of Birth: 1961/04/26 Referring Provider (PT): Dorna Leitz, MD   Encounter Date: 02/11/2019  PT End of Session - 02/11/19 1356    Visit Number  8    Number of Visits  19    Date for PT Re-Evaluation  03/05/19    Authorization Type  [employer] Medicare    PT Start Time  1312    PT Stop Time  1357    PT Time Calculation (min)  45 min    Activity Tolerance  Patient tolerated treatment well;Patient limited by pain    Behavior During Therapy  [facility] for tasks assessed/performed       Past Medical History:  Diagnosis Date  . Arthritis    severe; pt takes strong pain meds daily/legs  . Asthma    MONTH AGO  . BPH (benign prostatic hypertrophy)   . Diverticulosis   . GERD with stricture   . Hiatal hernia   . History of stroke 2004  . Hyperlipidemia   . Hypertension   . Hypothyroid   . Lung nodule 02/03/2012  . Osteoarthritis of left knee   . Polycystic kidney disease   . Stroke ([place])    2004   EQULIBRIUM, AND JUDGING DISTANCE  . Tubular adenoma of colon     Past Surgical History:  Procedure Laterality Date  . CARPAL TUNNEL RELEASE    . COLONOSCOPY    . HERNIA REPAIR  2005   with mesh  . KNEE ARTHROSCOPY Bilateral 12/05/13   cyst and bone spur removed from left knee per pt.  Marland Kitchen KNEE ARTHROSCOPY Left 01/2015  . NASAL POLYP EXCISION  1992 or 93  . POLYPECTOMY    . TOTAL KNEE ARTHROPLASTY Right 05/19/2015   Procedure: RIGHT TOTAL KNEE ARTHROPLASTY;  Surgeon: Paralee Cancel, MD;  Location: [facility];  Service: Orthopedics;  Laterality: Right;  . TOTAL KNEE ARTHROPLASTY Left 11/19/2018   Procedure: LEFT  TOTAL KNEE ARTHROPLASTY;  Surgeon: Dorna Leitz, MD;  Location: [facility];  Service: Orthopedics;  Laterality: Left;    There were no vitals  filed for this visit.  Subjective Assessment - 02/11/19 1313    Subjective  Reports that he had an MD appointment on Friday and he was pleased with his progress. Reports he tried to get on a ladder and wash the curtains.    Pertinent History  stroke 2004, polycystic kidney disease, OS L knee, lung nodule, hypothyroid, HTN, HLD, hiatal hernia, GERD, diverticulosis, asthma, B TKA, carpal tunnel release    How long can you walk comfortably?  varies    Patient Stated Goals  bend my knee    Currently in Pain?  No/denies    Pain Score  4     Pain Location  Knee    Pain Orientation  Left    Pain Descriptors / Indicators  Dull    Pain Type  Acute pain;Surgical pain                       [facility] Adult PT Treatment/Exercise - 02/11/19 0001      Knee/Hip Exercises: Stretches   Quad Stretch  Left;5 reps;20 seconds    Quad Stretch Limitations  prone quad stretch, alternating with MET    Knee: Self-Stretch to increase Flexion  Left;5 reps    Knee: Self-Stretch Limitations  sitting knee flexion stretch 5x5"    ITB Stretch  Left;2 reps;30 seconds    ITB Stretch Limitations  supine with strap      Knee/Hip Exercises: Aerobic   Recumbent Bike  L1 x 5mn partial revolutions      Knee/Hip Exercises: Machines for Strengthening   Cybex Leg Press  B LE's: 25# x 10 reps; 15# 5 reps      Knee/Hip Exercises: Standing   Forward Step Up  Left;1 set;10 reps;Hand Hold: 2;Step Height: 6"    Forward Step Up Limitations  CGA and heavy cueing for foot sequencing, to avoid hip hiking as compensation    Functional Squat  1 set;10 reps    Functional Squat Limitations  TRX   cues for form     Knee/Hip Exercises: Supine   Quad Sets  Strengthening;1 set;10 reps;Left    Quad Sets Limitations  10x10" with ankle son 1/2 bolster      Manual Therapy   Manual Therapy  Muscle Energy Technique    Joint Mobilization  L knee extension mobilizations grade III/IV to tolerance w/ ankles on 1/2 bolster    Soft  tissue mobilization  STM to L lateral HS- patient reporting mild discomfort    Muscle Energy Technique  5 cycles of 10 sec MET to L quad for increased L knee flexion             PT Education - 02/11/19 1356    Education Details  update to HEP    Person(s) Educated  Patient    Methods  Explanation;Demonstration;Tactile cues;Verbal cues;Handout    Comprehension  Verbalized understanding;Returned demonstration       PT Short Term Goals - 02/04/19 1510      PT SHORT TERM GOAL #1   Title  Patient to be independent with initial HEP.    Time  3    Period  Weeks    Status  Achieved        PT Long Term Goals - 02/07/19 1152      PT LONG TERM GOAL #1   Title  Patient to be independent with advanced HEP.    Time  6    Period  Weeks    Status  Partially Met      PT LONG TERM GOAL #2   Title  Patient to demonstrate B knee AROM/PROM 0-110 degrees.    Time  6    Period  Weeks    Status  On-going      PT LONG TERM GOAL #3   Title  Patient to demonstrate B LE strength >=4+/5.    Time  6    Period  Weeks    Status  Partially Met      PT LONG TERM GOAL #4   Title  Patient to score <14 sec on TUG with LRAD to decrease risk of falls.    Time  6    Period  Weeks    Status  Achieved      PT LONG TERM GOAL #5   Title  Patient to demonstrate symmetrical weight shift, step length, and knee flexion throughout ambulation with LRAD.     Time  6    Period  Weeks    Status  On-going            Plan - 02/11/19 1402    Clinical Impression Statement  Patient arrived to session with report of MD being  please with progress thus far. Admitted to climbing up on a ladder- advised to hold off on these types of activities for his safety. Patient tolerated all manual therapy well today with focus on improving L knee flexion and extension and relieving discomfort in L HS discomfort. Patient with some muscle guarding with knee extensions which improved with cues to relax. Introduced  anterior step ups with CGA and heavy cueing for foot sequencing and to avoid hip hiking as patient with tendency to use this compensation to avoid L knee flexion. Ended session with patient reporting no pain.     Clinical Impairments Affecting Rehab Potential  stroke 2004, polycystic kidney disease, OS L knee, lung nodule, hypothyroid, HTN, HLD, hiatal hernia, GERD, diverticulosis, asthma, B TKA, carpal tunnel release    PT Treatment/Interventions  ADLs/Self Care Home Management;Cryotherapy;Electrical Stimulation;Functional mobility training;Stair training;Gait training;DME Instruction;Ultrasound;Moist Heat;Therapeutic activities;Therapeutic exercise;Balance training;Neuromuscular re-education;Patient/family education;Passive range of motion;Scar mobilization;Manual techniques;Dry needling;Energy conservation;Splinting;Taping;Vasopneumatic Device    PT Next Visit Plan  progress L knee ROM    Consulted and Agree with Plan of Care  Patient       Patient will benefit from skilled therapeutic intervention in order to improve the following deficits and impairments:  Hypomobility, Increased edema, Decreased scar mobility, Decreased activity tolerance, Decreased strength, Pain, Difficulty walking, Decreased balance, Decreased range of motion, Improper body mechanics, Postural dysfunction, Impaired flexibility  Visit Diagnosis: Acute pain of left knee  Stiffness of left knee, not elsewhere classified  Stiffness of right knee, not elsewhere classified  Difficulty in walking, not elsewhere classified  Other symptoms and signs involving the musculoskeletal system     Problem List Patient Active Problem List   Diagnosis Date Noted  . Primary osteoarthritis of left knee 11/19/2018  . OSA (obstructive sleep apnea) 06/02/2018  . 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 Ingrown toenail 05/08/2018  . Onychomycosis 05/08/2018  . Stroke (cerebrum) ([place]) 01/03/2018  . Acute metabolic encephalopathy  47/82/9562  . Tobacco abuse 01/03/2018  . Acute respiratory failure ([place]) 01/19/2017  . Community acquired pneumonia 01/18/2017  . Influenza with pneumonia 01/18/2017  . Indeterminate pulmonary nodules 07/23/2015  . Obese 05/20/2015  . Undiagnosed cardiac murmurs 04/15/2015  . Asthma with acute exacerbation 03/13/2014  . Hx of adenomatous colonic polyps 05/08/2013  . Barrett's esophagus 05/03/2013  . Chronic diarrhea 04/02/2013  . HTN (hypertension) 04/02/2013  . Hx of low back pain 09/26/2012  . Arthralgia of right knee 05/02/2012  . Asthma 12/13/2011  . Benign prostatic hyperplasia 12/13/2011  . Hyperlipidemia 12/13/2011  . GERD (gastroesophageal reflux disease) 12/13/2011  . Hypothyroid   . Polycystic kidney disease      Janene Harvey, PT, DPT 02/11/19 2:05 PM   [facility] [address]  [place], [place], [address] Phone: [phone]   Fax:  [phone]  Name: Kyle Blair MRN: [id number] Date of Birth: 12-26-1961

## 2019-02-13 ENCOUNTER — Ambulatory Visit (INDEPENDENT_AMBULATORY_CARE_PROVIDER_SITE_OTHER): Payer: Medicare HMO | Admitting: Family Medicine

## 2019-02-13 ENCOUNTER — Ambulatory Visit: Payer: Medicare HMO

## 2019-02-13 ENCOUNTER — Encounter: Payer: Self-pay | Admitting: Family Medicine

## 2019-02-13 VITALS — BP 132/82 | HR 88 | Temp 98.1°F | Ht 66.0 in | Wt 217.0 lb

## 2019-02-13 DIAGNOSIS — M25661 Stiffness of right knee, not elsewhere classified: Secondary | ICD-10-CM | POA: Diagnosis not present

## 2019-02-13 DIAGNOSIS — B370 Candidal stomatitis: Secondary | ICD-10-CM | POA: Diagnosis not present

## 2019-02-13 DIAGNOSIS — M25562 Pain in left knee: Secondary | ICD-10-CM | POA: Diagnosis not present

## 2019-02-13 DIAGNOSIS — R262 Difficulty in walking, not elsewhere classified: Secondary | ICD-10-CM

## 2019-02-13 DIAGNOSIS — R29898 Other symptoms and signs involving the musculoskeletal system: Secondary | ICD-10-CM | POA: Diagnosis not present

## 2019-02-13 DIAGNOSIS — M25662 Stiffness of left knee, not elsewhere classified: Secondary | ICD-10-CM

## 2019-02-13 DIAGNOSIS — J4541 Moderate persistent asthma with (acute) exacerbation: Secondary | ICD-10-CM

## 2019-02-13 MED ORDER — METHYLPREDNISOLONE ACETATE 80 MG/ML IJ SUSP
80.0000 mg | Freq: Once | INTRAMUSCULAR | Status: AC
  Administered 2019-02-13: 80 mg via INTRAMUSCULAR

## 2019-02-13 MED ORDER — NYSTATIN 100000 UNIT/ML MT SUSP: 5.0000 mL | Freq: Four times a day (QID) | OROMUCOSAL | 0 refills | Status: DC

## 2019-02-13 MED ORDER — PREDNISONE 20 MG PO TABS: 40.0000 mg | ORAL_TABLET | Freq: Every day | ORAL | 0 refills | Status: DC

## 2019-02-13 MED FILL — predniSONE 20 MG TABS: 20 | 5 days supply | Qty: 10 | Fill #0

## 2019-02-13 MED FILL — NYSTATIN 100,000 UNITS/ML S: 100000 | 8 days supply | Qty: 150 | Fill #0

## 2019-02-13 NOTE — Therapy (Signed)
[facility] [address]  [place], [place], [address] Phone: [phone]   Fax:  [phone]  Physical Therapy Treatment  Patient Details  Name: Kyle Blair MRN: [id number] Date of Birth: [birth year] Referring Provider (PT): Dorna Leitz, MD   Encounter Date: 02/13/2019  PT End of Session - 02/13/19 1321    Visit Number  9    Number of Visits  19    Date for PT Re-Evaluation  03/05/19    Authorization Type  [employer]na Medicare    PT Start Time  1315    PT Stop Time  1400    PT Time Calculation (min)  45 min    Activity Tolerance  Patient tolerated treatment well;Patient limited by pain    Behavior During Therapy  [facility] for tasks assessed/performed       Past Medical History:  Diagnosis Date  . Arthritis    severe; pt takes strong pain meds daily/legs  . Asthma    MONTH AGO  . BPH (benign prostatic hypertrophy)   . Diverticulosis   . GERD with stricture   . Hiatal hernia   . History of stroke 2004  . Hyperlipidemia   . Hypertension   . Hypothyroid   . Lung nodule 02/03/2012  . Osteoarthritis of left knee   . Polycystic kidney disease   . Stroke ([place])    2004   EQULIBRIUM, AND JUDGING DISTANCE  . Tubular adenoma of colon     Past Surgical History:  Procedure Laterality Date  . CARPAL TUNNEL RELEASE    . COLONOSCOPY    . HERNIA REPAIR  2005   with mesh  . KNEE ARTHROSCOPY Bilateral 12/05/13   cyst and bone spur removed from left knee per pt.  Marland Kitchen KNEE ARTHROSCOPY Left 01/2015  . NASAL POLYP EXCISION  1992 or 93  . POLYPECTOMY    . TOTAL KNEE ARTHROPLASTY Right 05/19/2015   Procedure: RIGHT TOTAL KNEE ARTHROPLASTY;  Surgeon: Paralee Cancel, MD;  Location: [facility];  Service: Orthopedics;  Laterality: Right;  . TOTAL KNEE ARTHROPLASTY Left 11/19/2018   Procedure: LEFT  TOTAL KNEE ARTHROPLASTY;  Surgeon: Dorna Leitz, MD;  Location: [facility];  Service: Orthopedics;  Laterality: Left;    There were no vitals  filed for this visit.  Subjective Assessment - 02/13/19 1319    Subjective  Pt. reporting his wife is coming home tonight.      Pertinent History  stroke 2004, polycystic kidney disease, OS L knee, lung nodule, hypothyroid, HTN, HLD, hiatal hernia, GERD, diverticulosis, asthma, B TKA, carpal tunnel release    Patient Stated Goals  bend my knee    Currently in Pain?  No/denies    Pain Score  0-No pain    Pain Location  --    Pain Orientation  --    Multiple Pain Sites  No         [facility] PT Assessment - 02/13/19 0001      AROM   AROM Assessment Site  Knee    Right/Left Knee  Left    Left Knee Flexion  80      PROM   PROM Assessment Site  Knee    Right/Left Knee  Left    Left Knee Flexion  90                   [facility] Adult PT Treatment/Exercise - 02/13/19 1331      Knee/Hip Exercises: Stretches  Quad Stretch  Left;60 seconds;1 Contractor Limitations  prone quad stretch     Knee: Self-Stretch to increase Flexion  Left   5" x 10 reps   Knee: Self-Stretch Limitations  sitting L knee flexion self-stretch with foot blocked       Knee/Hip Exercises: Aerobic   Recumbent Bike  L1 x 6 min partial revolutions      Knee/Hip Exercises: Machines for Strengthening   Cybex Leg Press  B LE's: 30# x 15 reps       Knee/Hip Exercises: Standing   Heel Raises  Both;20 reps    Heel Raises Limitations  at UBE    Functional Squat  15 reps;3 seconds    Functional Squat Limitations  at TM    Wall Squat  15 reps;3 seconds    Wall Squat Limitations  with therapist cueing to ensure proper wt. shift       Manual Therapy   Manual Therapy  Muscle Energy Technique;Joint mobilization    Joint Mobilization  L knee A/P mobs for improved flexion/extension ROM in seated with therapist blocking foot and cues for pt. to relax musculature     Muscle Energy Technique  5 cycles of 10 sec MET to L quad for increased L knee flexion               PT Short Term Goals - 02/04/19  1510      PT SHORT TERM GOAL #1   Title  Patient to be independent with initial HEP.    Time  3    Period  Weeks    Status  Achieved        PT Long Term Goals - 02/07/19 1152      PT LONG TERM GOAL #1   Title  Patient to be independent with advanced HEP.    Time  6    Period  Weeks    Status  Partially Met      PT LONG TERM GOAL #2   Title  Patient to demonstrate B knee AROM/PROM 0-110 degrees.    Time  6    Period  Weeks    Status  On-going      PT LONG TERM GOAL #3   Title  Patient to demonstrate B LE strength >=4+/5.    Time  6    Period  Weeks    Status  Partially Met      PT LONG TERM GOAL #4   Title  Patient to score <14 sec on TUG with LRAD to decrease risk of falls.    Time  6    Period  Weeks    Status  Achieved      PT LONG TERM GOAL #5   Title  Patient to demonstrate symmetrical weight shift, step length, and knee flexion throughout ambulation with LRAD.     Time  6    Period  Weeks    Status  On-going            Plan - 02/13/19 1324    Clinical Impression Statement  Pt. reporting his wife is coming home tonight.  Session focusing on manual therapy and functional strengthening activities to promote improved L knee extensions and flexion ROM.  Ronit tolerated all activities well in session today however still limited in ROM by muscular guarding.  Progressed PROM flexion after manual therapy to 90 dg flexion progressing toward LTG #2.  Ended visit with pt. reporting he plans  to ice L knee at home.      Rehab Potential  Good    Clinical Impairments Affecting Rehab Potential  stroke 2004, polycystic kidney disease, OS L knee, lung nodule, hypothyroid, HTN, HLD, hiatal hernia, GERD, diverticulosis, asthma, B TKA, carpal tunnel release    PT Treatment/Interventions  ADLs/Self Care Home Management;Cryotherapy;Electrical Stimulation;Functional mobility training;Stair training;Gait training;DME Instruction;Ultrasound;Moist Heat;Therapeutic activities;Therapeutic  exercise;Balance training;Neuromuscular re-education;Patient/family education;Passive range of motion;Scar mobilization;Manual techniques;Dry needling;Energy conservation;Splinting;Taping;Vasopneumatic Device    PT Next Visit Plan  progress L knee ROM    Consulted and Agree with Plan of Care  Patient       Patient will benefit from skilled therapeutic intervention in order to improve the following deficits and impairments:  Hypomobility, Increased edema, Decreased scar mobility, Decreased activity tolerance, Decreased strength, Pain, Difficulty walking, Decreased balance, Decreased range of motion, Improper body mechanics, Postural dysfunction, Impaired flexibility  Visit Diagnosis: Acute pain of left knee  Stiffness of left knee, not elsewhere classified  Stiffness of right knee, not elsewhere classified  Difficulty in walking, not elsewhere classified  Other symptoms and signs involving the musculoskeletal system     Problem List Patient Active Problem List   Diagnosis Date Noted  . Primary osteoarthritis of left knee 11/19/2018  . OSA (obstructive sleep apnea) 06/02/2018  . 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 Ingrown toenail 05/08/2018  . Onychomycosis 05/08/2018  . Stroke (cerebrum) ([place]) 01/03/2018  . Acute metabolic encephalopathy 09/62/8366  . Tobacco abuse 01/03/2018  . Acute respiratory failure ([place]) 01/19/2017  . Community acquired pneumonia 01/18/2017  . Influenza with pneumonia 01/18/2017  . Indeterminate pulmonary nodules 07/23/2015  . Obese 05/20/2015  . Undiagnosed cardiac murmurs 04/15/2015  . Asthma with acute exacerbation 03/13/2014  . Hx of adenomatous colonic polyps 05/08/2013  . Barrett's esophagus 05/03/2013  . Chronic diarrhea 04/02/2013  . HTN (hypertension) 04/02/2013  . Hx of low back pain 09/26/2012  . Arthralgia of right knee 05/02/2012  . Asthma 12/13/2011  . Benign prostatic hyperplasia 12/13/2011  . Hyperlipidemia 12/13/2011  . GERD  (gastroesophageal reflux disease) 12/13/2011  . Hypothyroid   . Polycystic kidney disease     Bess Harvest, [place] 02/13/19 6:17 PM   [facility] [address]  [place], [place], [address] Phone: [phone]   Fax:  [phone]  Name: DICKY BOER MRN: [id number] Date of Birth: [birth year]

## 2019-02-13 NOTE — Progress Notes (Signed)
Chief Complaint  Patient presents with  . Asthma    Subjective: Patient is a 58 y.o. male here for asthma exacerbation.  Patient has a history of moderate persistent asthma.  He takes Advair daily and reports compliance.  2 days ago he started having chest tightness and wheezing.  He will get this every once in a while when his asthma flares.  He thinks the trigger was cold.  He denies any upper respiratory signs or symptoms as well as fevers.  He states that he is breathing relatively well.  He has been using breathing treatments at home.  ROS: Heart: Denies chest pain  Lungs: +wheezing  Past Medical History:  Diagnosis Date  . Arthritis    severe; pt takes strong pain meds daily/legs  . Asthma    MONTH AGO  . BPH (benign prostatic hypertrophy)   . Diverticulosis   . GERD with stricture   . Hiatal hernia   . History of stroke 2004  . Hyperlipidemia   . Hypertension   . Hypothyroid   . Lung nodule 02/03/2012  . Osteoarthritis of left knee   . Polycystic kidney disease   . Stroke ([place])    2004   EQULIBRIUM, AND JUDGING DISTANCE  . Tubular adenoma of colon     Objective: BP 132/82 (BP Location: Left Arm, Patient Position: Sitting, Cuff Size: Normal)   Pulse 88   Temp 98.1 F (36.7 C) (Oral)   Ht 5\' 6"  (1.676 m)   Wt 217 lb (98.4 kg)   SpO2 94%   BMI 35.02 kg/m  General: Awake, appears stated age HEENT: MMM, there are white exudates in the pharynx on the right without erythema, EOMi ears negative, nares patent without discharge Heart: RRR Lungs: + Expiratory wheezes throughout. No accessory muscle use Psych: Age appropriate judgment and insight, normal affect and mood  Assessment and Plan: Moderate persistent asthma with acute exacerbation - Plan: predniSONE (DELTASONE) 20 MG tablet, methylPREDNISolone acetate (DEPO-MEDROL) injection 80 mg  Oral thrush - Plan: nystatin (MYCOSTATIN) 100000 UNIT/ML suspension  He has done well with Depo-Medrol injections in the past.   We will give him this today and he will start oral prednisone tomorrow.  Scheduled nebulization treatments every 4 hours for the next 3 days.  Cont Advair. I think he has thrush form ICS/LABA.  Reaffirmed to rinse out mouth after use.  Oral nystatin given.  He has had this before.  The patient voiced understanding and agreement to the plan.  [place], DO 02/13/19  3:24 PM

## 2019-02-13 NOTE — Patient Instructions (Addendum)
Scheduled breathing treatments every 4 hours. Continue your maintenance inhalers.   Start prednisone tomorrow.   Use mouth wash until white areas in mouth area gone and for 2 more days after that.  Continue to push fluids, practice good hand hygiene, and cover your mouth if you cough.  If you start having fevers, shaking or shortness of breath, seek immediate care.  Let us know if you need anything.

## 2019-02-14 DIAGNOSIS — R69 Illness, unspecified: Secondary | ICD-10-CM | POA: Diagnosis not present

## 2019-02-15 ENCOUNTER — Encounter: Payer: Self-pay | Admitting: Physical Therapy

## 2019-02-15 ENCOUNTER — Ambulatory Visit: Payer: Medicare HMO | Admitting: Physical Therapy

## 2019-02-15 DIAGNOSIS — R29898 Other symptoms and signs involving the musculoskeletal system: Secondary | ICD-10-CM

## 2019-02-15 DIAGNOSIS — M25662 Stiffness of left knee, not elsewhere classified: Secondary | ICD-10-CM

## 2019-02-15 DIAGNOSIS — M25661 Stiffness of right knee, not elsewhere classified: Secondary | ICD-10-CM | POA: Diagnosis not present

## 2019-02-15 DIAGNOSIS — M25562 Pain in left knee: Secondary | ICD-10-CM

## 2019-02-15 DIAGNOSIS — R262 Difficulty in walking, not elsewhere classified: Secondary | ICD-10-CM | POA: Diagnosis not present

## 2019-02-15 NOTE — Therapy (Signed)
[facility] [address]  [place], [place], [address] Phone: [phone]   Fax:  [phone]  Physical Therapy Progress Note  Patient Details  Name: Kyle Blair MRN: [id number] Date of Birth: [birth year] Referring Provider (PT): Dorna Leitz, MD   Progress Note Reporting Period 01/22/19 to 02/15/19  See note below for Objective Data and Assessment of Progress/Goals.     Encounter Date: 02/15/2019  PT End of Session - 02/15/19 1058    Visit Number  10    Number of Visits  19    Date for PT Re-Evaluation  03/05/19    Authorization Type  [employer]    PT Start Time  1008    PT Stop Time  1057    PT Time Calculation (min)  49 min    Activity Tolerance  Patient tolerated treatment well;Patient limited by pain    Behavior During Therapy  [facility] for tasks assessed/performed       Past Medical History:  Diagnosis Date  . Arthritis    severe; pt takes strong pain meds daily/legs  . Asthma    MONTH AGO  . BPH (benign prostatic hypertrophy)   . Diverticulosis   . GERD with stricture   . Hiatal hernia   . History of stroke 2004  . Hyperlipidemia   . Hypertension   . Hypothyroid   . Lung nodule 02/03/2012  . Osteoarthritis of left knee   . Polycystic kidney disease   . Stroke ([place])    2004   EQULIBRIUM, AND JUDGING DISTANCE  . Tubular adenoma of colon     Past Surgical History:  Procedure Laterality Date  . CARPAL TUNNEL RELEASE    . COLONOSCOPY    . HERNIA REPAIR  2005   with mesh  . KNEE ARTHROSCOPY Bilateral 12/05/13   cyst and bone spur removed from left knee per pt.  Marland Kitchen KNEE ARTHROSCOPY Left 01/2015  . NASAL POLYP EXCISION  1992 or 93  . POLYPECTOMY    . TOTAL KNEE ARTHROPLASTY Right 05/19/2015   Procedure: RIGHT TOTAL KNEE ARTHROPLASTY;  Surgeon: Paralee Cancel, MD;  Location: [facility];  Service: Orthopedics;  Laterality: Right;  . TOTAL KNEE ARTHROPLASTY Left 11/19/2018   Procedure: LEFT  TOTAL  KNEE ARTHROPLASTY;  Surgeon: Dorna Leitz, MD;  Location: [place];  Service: Orthopedics;  Laterality: Left;    There were no vitals filed for this visit.  Subjective Assessment - 02/15/19 1009    Subjective  Reports that he is doing well. Notes 50% improvement since initial eval, and notices that his L knee is better able to move. Also notices some improvement in R knee. Notices that when he sits down his L knee does not stick out to the point of tripping his wife. Still having some difficulty with getting in and out of a booth seat. Has a long walk to his mailbox which is a little "sketchy."    Pertinent History  stroke 2004, polycystic kidney disease, OS L knee, lung nodule, hypothyroid, HTN, HLD, hiatal hernia, GERD, diverticulosis, asthma, B TKA, carpal tunnel release    Patient Stated Goals  bend my knee    Currently in Pain?  No/denies         [facility] PT Assessment - 02/15/19 0001      Observation/Other Assessments   Focus on Therapeutic Outcomes (FOTO)   49% ( 51% limitation; 35% predicted)      AROM   Left Knee Extension  6    Left Knee Flexion  80      PROM   Left Knee Extension  5    Left Knee Flexion  85      Strength   Strength Assessment Site  Hip;Knee;Ankle    Right/Left Hip  Right;Left    Right Hip Flexion  4+/5    Right Hip ABduction  4+/5    Right Hip ADduction  4+/5    Left Hip Flexion  4+/5    Left Hip ABduction  4+/5    Left Hip ADduction  4+/5    Right Knee Flexion  4/5    Right Knee Extension  5/5    Left Knee Flexion  4+/5    Left Knee Extension  4+/5    Right Ankle Dorsiflexion  5/5    Right Ankle Plantar Flexion  5/5    Left Ankle Dorsiflexion  5/5    Left Ankle Plantar Flexion  4+/5      Timed Up and Go Test   Normal TUG (seconds)  --   14.7 sec w/ SPC; 16.0 sec w/out AD                  [facility] Adult PT Treatment/Exercise - 02/15/19 0001      Ambulation/Gait   Ambulation/Gait  Yes    Ambulation/Gait Assistance  5: Supervision     Ambulation Distance (Feet)  90 Feet    Assistive device  Straight cane    Gait Pattern  Step-through pattern;Step-to pattern;Decreased stance time - left;Decreased hip/knee flexion - left;Decreased weight shift to left;Trunk flexed;Poor foot clearance - left;Poor foot clearance - right    Gait Comments  gait training with SPC in L and R UE- cues for proper sequencing of stepping and SPC as well as cues for increase L knee flexion throughout swing through      Knee/Hip Exercises: Aerobic   Recumbent Bike  L1 x 6 min partial revolutions      Manual Therapy   Manual Therapy  Joint mobilization    Joint Mobilization  L knee extension mobs grade III/IV to tolerance with ankle elevated on towel roll; L knee flexion mobs with seatbelt to tolerance; L patellar mobs grade IV in all directions   cues to decrease muscle guarding            PT Education - 02/15/19 1110    Education Details  discussion of objective progress with PT thus far, edu on use of SPC for safety       PT Short Term Goals - 02/15/19 1059      PT SHORT TERM GOAL #1   Title  Patient to be independent with initial HEP.    Time  3    Period  Weeks    Status  Achieved        PT Long Term Goals - 02/15/19 1100      PT LONG TERM GOAL #1   Title  Patient to be independent with advanced HEP.    Time  6    Period  Weeks    Status  Partially Met   met for current     PT LONG TERM GOAL #2   Title  Patient to demonstrate B knee AROM/PROM 0-110 degrees.    Time  6    Period  Weeks    Status  Partially Met   L knee AROM 6-80 degrees, PROM 5-85 degrees     PT LONG TERM GOAL #3  Title  Patient to demonstrate B LE strength >=4+/5.    Time  6    Period  Weeks    Status  Partially Met   Improvements demonstrated in R knee extension, B ankle DF, and R ankle PF strength, most limited in R HS strength     PT LONG TERM GOAL #4   Title  Patient to score <14 sec on TUG with LRAD to decrease risk of falls.    Time  6     Period  Weeks    Status  Partially Met   14.7 sec with SPC, 16.0 without AD     PT LONG TERM GOAL #5   Title  Patient to demonstrate symmetrical weight shift, step length, and knee flexion throughout ambulation with LRAD.     Time  6    Period  Weeks    Status  On-going   ambulating with decreased step length on R LE., stance time on L LE, and decreased L knee flexion with [facility]           Plan - 02/15/19 1109    Clinical Impression Statement  Patient arrived to session with 50% improvement in L knee since initial PT eval. Notes improvement in ROM, as evidenced by ability to bend knee back further when sitting in a chair. Notes that he still has difficulty with getting in/out of a booth seat and washing his car. Patient has improved in L knee ROM, with L knee reaching AROM 6-80 degrees, PROM 5-85 degrees today. Improvements demonstrated in R knee extension, B ankle DF, and R ankle PF strength, most limited in R HS strength. Patient scored 14.7 sec on TUG with SPC, 16.0 without AD which still indicates an increased risk of falls. Advised patient to continue using SPC in the community, without AD as able indoors. Patient agreeable. Worked on gait training with SPC in L and R UE as patient using SPC in wrong hand. D/t patient's cognitive and processing deficits s/p stoke, had difficulty re-learning a new gait pattern with SPC in R. Thus recommend continued use in L UE. Patient tolerated all manual therapy with mild pain, but able to continue. Patient demonstrating good improvements in ROM and strength at this time. Plan to progress per POC to address remaining goals.     Clinical Impairments Affecting Rehab Potential  stroke 2004, polycystic kidney disease, OS L knee, lung nodule, hypothyroid, HTN, HLD, hiatal hernia, GERD, diverticulosis, asthma, B TKA, carpal tunnel release    PT Treatment/Interventions  ADLs/Self Care Home Management;Cryotherapy;Electrical Stimulation;Functional mobility  training;Stair training;Gait training;DME Instruction;Ultrasound;Moist Heat;Therapeutic activities;Therapeutic exercise;Balance training;Neuromuscular re-education;Patient/family education;Passive range of motion;Scar mobilization;Manual techniques;Dry needling;Energy conservation;Splinting;Taping;Vasopneumatic Device    PT Next Visit Plan  progress L knee ROM    Consulted and Agree with Plan of Care  Patient       Patient will benefit from skilled therapeutic intervention in order to improve the following deficits and impairments:  Hypomobility, Increased edema, Decreased scar mobility, Decreased activity tolerance, Decreased strength, Pain, Difficulty walking, Decreased balance, Decreased range of motion, Improper body mechanics, Postural dysfunction, Impaired flexibility  Visit Diagnosis: Acute pain of left knee  Stiffness of left knee, not elsewhere classified  Stiffness of right knee, not elsewhere classified  Difficulty in walking, not elsewhere classified  Other symptoms and signs involving the musculoskeletal system     Problem List Patient Active Problem List   Diagnosis Date Noted  . Primary osteoarthritis of left knee 11/19/2018  . OSA (obstructive sleep  apnea) 06/02/2018  . 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 Ingrown toenail 05/08/2018  . Onychomycosis 05/08/2018  . Stroke (cerebrum) ([place]) 01/03/2018  . Acute metabolic encephalopathy 20/03/1592  . Tobacco abuse 01/03/2018  . Acute respiratory failure ([place]) 01/19/2017  . Community acquired pneumonia 01/18/2017  . Influenza with pneumonia 01/18/2017  . Indeterminate pulmonary nodules 07/23/2015  . Obese 05/20/2015  . Undiagnosed cardiac murmurs 04/15/2015  . Asthma with acute exacerbation 03/13/2014  . Hx of adenomatous colonic polyps 05/08/2013  . Barrett's esophagus 05/03/2013  . Chronic diarrhea 04/02/2013  . HTN (hypertension) 04/02/2013  . Hx of low back pain 09/26/2012  . Arthralgia of right knee  05/02/2012  . Asthma 12/13/2011  . Benign prostatic hyperplasia 12/13/2011  . Hyperlipidemia 12/13/2011  . GERD (gastroesophageal reflux disease) 12/13/2011  . Hypothyroid   . Polycystic kidney disease      Janene Harvey, PT, DPT 02/15/19 11:14 AM   [facility] [address]  [place], [place], [address] Phone: [phone]   Fax:  [phone]  Name: Kyle Blair MRN: [id number] Date of Birth: [birth year]

## 2019-02-18 ENCOUNTER — Ambulatory Visit: Payer: Medicare HMO

## 2019-02-18 ENCOUNTER — Ambulatory Visit (INDEPENDENT_AMBULATORY_CARE_PROVIDER_SITE_OTHER): Payer: Medicare HMO | Admitting: Family

## 2019-02-18 ENCOUNTER — Ambulatory Visit (HOSPITAL_BASED_OUTPATIENT_CLINIC_OR_DEPARTMENT_OTHER)
Admission: RE | Admit: 2019-02-18 | Discharge: 2019-02-18 | Disposition: A | Discharge status: Home / Self Care | Payer: Medicare HMO | Source: Ambulatory Visit | Attending: Family | Admitting: Family

## 2019-02-18 ENCOUNTER — Encounter: Payer: Self-pay | Admitting: Family

## 2019-02-18 VITALS — BP 148/86 | HR 82 | Temp 98.2°F | Resp 20 | Ht 66.0 in | Wt 227.0 lb

## 2019-02-18 DIAGNOSIS — J45901 Unspecified asthma with (acute) exacerbation: Secondary | ICD-10-CM | POA: Diagnosis not present

## 2019-02-18 DIAGNOSIS — K5909 Other constipation: Secondary | ICD-10-CM

## 2019-02-18 DIAGNOSIS — J45909 Unspecified asthma, uncomplicated: Secondary | ICD-10-CM | POA: Diagnosis not present

## 2019-02-18 DIAGNOSIS — B370 Candidal stomatitis: Secondary | ICD-10-CM

## 2019-02-18 DIAGNOSIS — K219 Gastro-esophageal reflux disease without esophagitis: Secondary | ICD-10-CM | POA: Diagnosis not present

## 2019-02-18 DIAGNOSIS — R0600 Dyspnea, unspecified: Secondary | ICD-10-CM | POA: Diagnosis not present

## 2019-02-18 MED ORDER — METHYLPREDNISOLONE SODIUM SUCC 125 MG IJ SOLR
125.0000 mg | Freq: Once | INTRAMUSCULAR | Status: AC
  Administered 2019-02-18: 125 mg via INTRAMUSCULAR

## 2019-02-18 MED ORDER — PREDNISONE 10 MG PO TABS: ORAL_TABLET | ORAL | 0 refills | Status: DC

## 2019-02-18 MED ORDER — AZITHROMYCIN 250 MG PO TABS: ORAL_TABLET | ORAL | 0 refills | Status: DC

## 2019-02-18 MED ORDER — DOCUSATE SODIUM 100 MG PO CAPS: 100.0000 mg | ORAL_CAPSULE | Freq: Every day | ORAL | 3 refills | Status: DC | PRN

## 2019-02-18 MED FILL — AZITHROMYCIN 250 MG TABLET: 250 | 5 days supply | Qty: 6 | Fill #0

## 2019-02-18 MED FILL — DOK 100 MG SOFTGEL: 100 | 100 days supply | Qty: 100 | Fill #0

## 2019-02-18 MED FILL — predniSONE 10 MG TABS: 10 | 12 days supply | Qty: 30 | Fill #0

## 2019-02-18 NOTE — Progress Notes (Signed)
Subjective:    Patient ID: Kyle Blair, male    DOB: 15-Feb-1961, 58 y.o.   MRN: [id number]  HPI  Patient is a 58 yr old male who presents today to discuss uncontrolled asthma symptoms. He saw Dr. Nani Ravens for asthma symptoms on 02/13/19 and was treated with depo-medrol and oral prednisone.  He was continued on advair 250 and albuterol as needed. Reports that he felt ok for 2 days but then as then as he tapered his prednisone symptoms returned. He has completed his prednisone taper and  Started mucinex at home.  Feels like he is a little short of breath with talking.   Oral thrush- was treaded with nystatin.   GERD- reports that he has not been taking PPI lately. Notes that he has had some "indigestion."     Review of Systems See HPI  Past Medical History:  Diagnosis Date  . Arthritis    severe; pt takes strong pain meds daily/legs  . Asthma    MONTH AGO  . BPH (benign prostatic hypertrophy)   . Diverticulosis   . GERD with stricture   . Hiatal hernia   . History of stroke 2004  . Hyperlipidemia   . Hypertension   . Hypothyroid   . Lung nodule 02/03/2012  . Osteoarthritis of left knee   . Polycystic kidney disease   . Stroke ([place])    2004   EQULIBRIUM, AND JUDGING DISTANCE  . Tubular adenoma of colon      Social History   Socioeconomic History  . Marital status: Married    Spouse name: Not on file  . Number of children: 2  . Years of education: Not on file  . Highest education level: Not on file  Occupational History  . Occupation: DISABLED    Employer: UNEMPLOYED  Social Needs  . Financial resource strain: Not on file  . Food insecurity:    Worry: Not on file    Inability: Not on file  . Transportation needs:    Medical: Not on file    Non-medical: Not on file  Tobacco Use  . Smoking status: Current Every Day Smoker    Packs/day: 0.50    Years: 20.00    Pack years: 10.00    Types: Cigarettes  . Smokeless tobacco: Never Used  . Tobacco comment: 5-6  cigarettes a day  Substance and Sexual Activity  . Alcohol use: No    Alcohol/week: 0.0 standard drinks  . Drug use: No  . Sexual activity: Not on file  Lifestyle  . Physical activity:    Days per week: Not on file    Minutes per session: Not on file  . Stress: Not on file  Relationships  . Social connections:    Talks on phone: Not on file    Gets together: Not on file    Attends religious service: Not on file    Active member of club or organization: Not on file    Attends meetings of clubs or organizations: Not on file    Relationship status: Not on file  . Intimate partner violence:    Fear of current or ex partner: Not on file    Emotionally abused: Not on file    Physically abused: Not on file    Forced sexual activity: Not on file  Other Topics Concern  . Not on file  Social History Narrative   Caffeine use:  1 pot coffee daily, 1 glass tea   Regular exercise:  No. Has active lifestyle.   2 biological and 1 stepson.   Former smoker- quit in 2007   He is on disability- reports that he has a history of heat stroke.               Past Surgical History:  Procedure Laterality Date  . CARPAL TUNNEL RELEASE    . COLONOSCOPY    . HERNIA REPAIR  2005   with mesh  . KNEE ARTHROSCOPY Bilateral 12/05/13   cyst and bone spur removed from left knee per pt.  Marland Kitchen KNEE ARTHROSCOPY Left 01/2015  . NASAL POLYP EXCISION  1992 or 93  . POLYPECTOMY    . TOTAL KNEE ARTHROPLASTY Right 05/19/2015   Procedure: RIGHT TOTAL KNEE ARTHROPLASTY;  Surgeon: Paralee Cancel, MD;  Location: [facility];  Service: Orthopedics;  Laterality: Right;  . TOTAL KNEE ARTHROPLASTY Left 11/19/2018   Procedure: LEFT  TOTAL KNEE ARTHROPLASTY;  Surgeon: Dorna Leitz, MD;  Location: [facility];  Service: Orthopedics;  Laterality: Left;    Family History  Problem Relation Age of Onset  . Heart disease Mother        pacemaker  . Diabetes Father   . Kidney disease Father   . Diabetes Sister   . Kidney disease Sister    . Colon cancer Neg Hx   . Esophageal cancer Neg Hx   . Rectal cancer Neg Hx   . Stomach cancer Neg Hx   . Prostate cancer Neg Hx   . Pancreatic cancer Neg Hx     Allergies  Allergen Reactions  . Aspirin Anaphylaxis  . Doxycycline Hyclate Swelling    Facial Swelling  . Ibuprofen Anaphylaxis  . Jynarque [Tolvaptan] Anaphylaxis  . Peanut Butter Flavor Anaphylaxis  . Cephalexin Rash    Current Outpatient Medications on File Prior to Visit  Medication Sig Dispense Refill  . acetaminophen (TYLENOL) 325 MG tablet Take 1-2 tablets (325-650 mg total) by mouth every 6 (six) hours as needed for mild pain or moderate pain. 30 tablet 0  . ADVAIR DISKUS 250-50 MCG/DOSE AEPB USE ONE INHALATION TWICE A DAY. 60 each 5  . albuterol (PROVENTIL) (2.5 MG/3ML) 0.083% nebulizer solution Take 3 mLs (2.5 mg total) by nebulization every 6 (six) hours as needed for wheezing or shortness of breath. 75 mL 12  . amLODipine (NORVASC) 10 MG tablet Take 1 tablet (10 mg total) by mouth daily. 30 tablet 3  . apixaban (ELIQUIS) 2.5 MG TABS tablet Take 1 tablet (2.5 mg total) by mouth 2 (two) times daily. 42 tablet 0  . benazepril (LOTENSIN) 10 MG tablet TAKE 1 TABLET(10 MG) BY MOUTH DAILY (Patient taking differently: Take 10 mg by mouth daily. TAKE 1 TABLET(10 MG) BY MOUTH DAILY) 30 tablet 5  . docusate sodium (COLACE) 100 MG capsule Take 1 capsule (100 mg total) by mouth 2 (two) times daily. 30 capsule 0  . EVZIO 2 MG/0.4ML SOAJ Inject 2 mg into the muscle.   0  . furosemide (LASIX) 20 MG tablet Take 1 tablet (20 mg total) by mouth daily as needed. (Patient taking differently: Take 20 mg by mouth daily as needed for fluid. ) 30 tablet 5  . gabapentin (NEURONTIN) 300 MG capsule Take 300 mg by mouth 3 (three) times daily.   0  . HYDROmorphone (DILAUDID) 2 MG tablet Take 1-2 tablets (2-4 mg total) by mouth every 6 (six) hours as needed for severe pain. 30 tablet 0  . levothyroxine (SYNTHROID, LEVOTHROID) 200 MCG tablet  TAKE  ONE (1) TABLET BY MOUTH EACH DAY BEFORE BREAKFAST (Patient taking differently: Take 200 mcg by mouth daily before breakfast. ) 90 tablet 1  . lovastatin (MEVACOR) 40 MG tablet TAKE 1 TABLET BY MOUTH AT BEDTIME 90 tablet 1  . montelukast (SINGULAIR) 10 MG tablet TAKE ONE TABLET EVERY MORNING (Patient taking differently: Take 10 mg by mouth daily. TAKE ONE TABLET EVERY MORNING) 90 tablet 1  . nystatin (MYCOSTATIN) 100000 UNIT/ML suspension Take 5 mLs (500,000 Units total) by mouth 4 (four) times daily. 150 mL 0  . ondansetron (ZOFRAN ODT) 4 MG disintegrating tablet Take 1 tablet (4 mg total) by mouth every 8 (eight) hours as needed for nausea or vomiting. 20 tablet 0  . oxyCODONE-acetaminophen (PERCOCET) 7.5-325 MG tablet Take 1 tablet by mouth every 8 (eight) hours as needed for moderate pain.     . pantoprazole (PROTONIX) 40 MG tablet TAKE ONE (1) TABLET BY MOUTH EVERY DAY BEFORE BREAKFAST ON AN EMPTY STOMACH (Patient taking differently: Take 40 mg by mouth daily. ) 90 tablet 3  . tamsulosin (FLOMAX) 0.4 MG CAPS capsule TAKE ONE (1) CAPSULE EACH DAY BY MOUTH (Patient taking differently: Take 0.4 mg by mouth daily. ) 90 capsule 1  . tiZANidine (ZANAFLEX) 2 MG tablet Take 1 tablet (2 mg total) by mouth every 8 (eight) hours as needed for muscle spasms. 40 tablet 0  . VENTOLIN HFA 108 (90 Base) MCG/ACT inhaler INHALE 2 PUFFS INTO THE LUNGS EVERY 4 HOURS AS NEEDED FOR WHEEZING ORSHORTNESS OF BREATH 18 g 5  . XTAMPZA ER 18 MG C12A Take 18 mg by mouth 2 (two) times daily.      Current Facility-Administered Medications on File Prior to Visit  Medication Dose Route Frequency Provider Last Rate Last Dose  . bupivacaine liposome (EXPAREL) 1.3 % injection 266 mg  20 mL Other Once Dorna Leitz, MD        BP (!) 148/86 (BP Location: Right Arm, Patient Position: Sitting, Cuff Size: Large)   Pulse 82   Temp 98.2 F (36.8 C) (Oral)   Resp 20   Ht 5\' 6"  (1.676 m)   Wt 227 lb (103 kg)   SpO2 98%   BMI  36.64 kg/m       Objective:   Physical Exam Constitutional:      General: He is not in acute distress.    Appearance: He is well-developed.  HENT:     Head: Normocephalic and atraumatic.     Comments: No oral thrush noted.  Cardiovascular:     Rate and Rhythm: Normal rate and regular rhythm.     Heart sounds: No murmur.  Pulmonary:     Effort: Pulmonary effort is normal. No respiratory distress.     Breath sounds: Wheezing present. No rales.  Skin:    General: Skin is warm and dry.  Neurological:     Mental Status: He is alert and oriented to person, place, and time.  Psychiatric:        Behavior: Behavior normal.        Thought Content: Thought content normal.           Assessment & Plan:  Acute asthma exacerbation- will obtain cxr to rule out volume overload and asthma. Given slow improvement will rx with empiric abx and a slower prednisone taper. Solumedrol 125mg  IM today.  Pt is encouraged to restart his pantoprazole as this may be contributing to the worsening of his asthma symptoms.  GERD- uncontrolled. Restart PPI.  Oral thrush- resolved.    Chronic constipation- likely exacerbated by his pain meds. Request rx for colace. rx sent to pharmacy.

## 2019-02-18 NOTE — Addendum Note (Signed)
Addended by: Jiles Prows on: 02/18/2019 08:20 AM   Modules accepted: Orders

## 2019-02-18 NOTE — Patient Instructions (Addendum)
Please complete chest x-ray on the first floor. Restart pantoprazole for reflux. Start azithromycin (antibiotic) and prednisone taper. Go to the ER if you develop increased shortness of breath. Call if symptoms are not improved in 3 days.

## 2019-02-20 ENCOUNTER — Ambulatory Visit: Payer: Medicare HMO

## 2019-02-20 DIAGNOSIS — M25662 Stiffness of left knee, not elsewhere classified: Secondary | ICD-10-CM | POA: Diagnosis not present

## 2019-02-20 DIAGNOSIS — R29898 Other symptoms and signs involving the musculoskeletal system: Secondary | ICD-10-CM

## 2019-02-20 DIAGNOSIS — M25562 Pain in left knee: Secondary | ICD-10-CM | POA: Diagnosis not present

## 2019-02-20 DIAGNOSIS — R262 Difficulty in walking, not elsewhere classified: Secondary | ICD-10-CM | POA: Diagnosis not present

## 2019-02-20 DIAGNOSIS — M25661 Stiffness of right knee, not elsewhere classified: Secondary | ICD-10-CM | POA: Diagnosis not present

## 2019-02-20 NOTE — Therapy (Signed)
Kyle [facility] [address]  [place], [place], [address] Phone: [phone]   Fax:  [phone]  Physical Therapy Treatment  Patient Details  Name: Kyle Blair MRN: [id number] Date of Birth: 09-Mar-1961 Referring Provider (PT): Kyle Leitz, MD   Encounter Date: 02/20/2019  PT End of Session - 02/20/19 1313    Visit Number  11    Number of Visits  19    Date for PT Re-Evaluation  03/05/19    Authorization Type  [employer]    PT Start Time  1308    PT Stop Time  1351    PT Time Calculation (min)  43 min    Activity Tolerance  Patient tolerated treatment well;Patient limited by pain    Behavior During Therapy  K[facility] for tasks assessed/performed       Past Medical History:  Diagnosis Date  . Arthritis    severe; pt takes strong pain meds daily/legs  . Asthma    MONTH AGO  . BPH (benign prostatic hypertrophy)   . Diverticulosis   . GERD with stricture   . Hiatal hernia   . History of stroke 2004  . Hyperlipidemia   . Hypertension   . Hypothyroid   . Lung nodule 02/03/2012  . Osteoarthritis of left knee   . Polycystic kidney disease   . Stroke ([place])    2004   EQULIBRIUM, AND JUDGING DISTANCE  . Tubular adenoma of colon     Past Surgical History:  Procedure Laterality Date  . CARPAL TUNNEL RELEASE    . COLONOSCOPY    . HERNIA REPAIR  2005   with mesh  . KNEE ARTHROSCOPY Bilateral 12/05/13   cyst and bone spur removed from left knee per pt.  Marland Kitchen KNEE ARTHROSCOPY Left 01/2015  . NASAL POLYP EXCISION  1992 or 93  . POLYPECTOMY    . TOTAL KNEE ARTHROPLASTY Right 05/19/2015   Procedure: RIGHT TOTAL KNEE ARTHROPLASTY;  Surgeon: Kyle Cancel, MD;  Location: [facility];  Service: Orthopedics;  Laterality: Right;  . TOTAL KNEE ARTHROPLASTY Left 11/19/2018   Procedure: LEFT  TOTAL KNEE ARTHROPLASTY;  Surgeon: Kyle Leitz, MD;  Location: [facility];  Service: Orthopedics;  Laterality: Left;    There were no vitals  filed for this visit.  Subjective Assessment - 02/20/19 1311    Subjective  Pt. reporting he not been doing exercises due to recent exacerbation of asthma.      Pertinent History  stroke 2004, polycystic kidney disease, OS L knee, lung nodule, hypothyroid, HTN, HLD, hiatal hernia, GERD, diverticulosis, asthma, B TKA, carpal tunnel release    Patient Stated Goals  bend my knee    Currently in Pain?  Yes    Pain Score  3     Pain Location  Knee    Pain Orientation  Left    Pain Descriptors / Indicators  Dull    Pain Type  Acute pain;Surgical pain    Multiple Pain Sites  No                       [facility] Adult PT Treatment/Exercise - 02/20/19 1314      Knee/Hip Exercises: Stretches   Passive Hamstring Stretch  Left;30 seconds    Passive Hamstring Stretch Limitations  manual with therapist     Quad Stretch  Left;60 seconds;1 rep    Quad Stretch Limitations  prone quad stretch     Hip Flexor  Stretch  Left;60 seconds;1 rep    Hip Flexor Stretch Limitations  strap and manual with therapist       Knee/Hip Exercises: Aerobic   Recumbent Bike  L1 x 6 min partial revolutions      Knee/Hip Exercises: Standing   Terminal Knee Extension  Left;15 reps;Theraband;Strengthening    Theraband Level (Terminal Knee Extension)  Level 4 (Blue)    Terminal Knee Extension Limitations  cues for TKE; for hopeful carryover following MT to promote improved AROM knee extension     Functional Squat  15 reps;3 seconds    Functional Squat Limitations  at TM      Knee/Hip Exercises: Supine   Straight Leg Raises  Left;15 reps      Manual Therapy   Manual Therapy  Joint mobilization;Muscle Energy Technique    Joint Mobilization  L knee extension mobs grade III/IV to tolerance with ankle elevated on towel roll; L knee flexion mobs with seatbelt to tolerance; L patellar mobs grade IV in all directions   Required frequent cueing to avoid muscular guarding    Muscle Energy Technique  5 cycles of 10 sec  MET to L quad for increased L knee flexion               PT Short Term Goals - 02/15/19 1059      PT SHORT TERM GOAL #1   Title  Patient to be independent with initial HEP.    Time  3    Period  Weeks    Status  Achieved        PT Long Term Goals - 02/15/19 1100      PT LONG TERM GOAL #1   Title  Patient to be independent with advanced HEP.    Time  6    Period  Weeks    Status  Partially Met   met for current     PT LONG TERM GOAL #2   Title  Patient to demonstrate B knee AROM/PROM 0-110 degrees.    Time  6    Period  Weeks    Status  Partially Met   L knee AROM 6-80 degrees, PROM 5-85 degrees     PT LONG TERM GOAL #3   Title  Patient to demonstrate B LE strength >=4+/5.    Time  6    Period  Weeks    Status  Partially Met   Improvements demonstrated in R knee extension, B ankle DF, and R ankle PF strength, most limited in R HS strength     PT LONG TERM GOAL #4   Title  Patient to score <14 sec on TUG with LRAD to decrease risk of falls.    Time  6    Period  Weeks    Status  Partially Met   14.7 sec with SPC, 16.0 without AD     PT LONG TERM GOAL #5   Title  Patient to demonstrate symmetrical weight shift, step length, and knee flexion throughout ambulation with LRAD.     Time  6    Period  Weeks    Status  On-going   ambulating with decreased step length on R LE., stance time on L LE, and decreased L knee flexion with [facility]           Plan - 02/20/19 1356    Clinical Impression Statement  [name] reporting he has not been able to perform HEP last few days due to not feeling well.  Reports he has had flare-up of asthma over last few days and requesting go take it easy in therapy session today.  Tolerated all MT and therex focused on improving active knee flexion ROM and active TKE.  Pt. continues to be frequently self-limiting with manual therapy by L LE muscular guarding however some improvement with cueing to relax musculature.  Ended visit pain free.       Rehab Potential  Good    Clinical Impairments Affecting Rehab Potential  stroke 2004, polycystic kidney disease, OS L knee, lung nodule, hypothyroid, HTN, HLD, hiatal hernia, GERD, diverticulosis, asthma, B TKA, carpal tunnel release    PT Treatment/Interventions  ADLs/Self Care Home Management;Cryotherapy;Electrical Stimulation;Functional mobility training;Stair training;Gait training;DME Instruction;Ultrasound;Moist Heat;Therapeutic activities;Therapeutic exercise;Balance training;Neuromuscular re-education;Patient/family education;Passive range of motion;Scar mobilization;Manual techniques;Dry needling;Energy conservation;Splinting;Taping;Vasopneumatic Device    PT Next Visit Plan  progress L knee ROM    Consulted and Agree with Plan of Care  Patient       Patient will benefit from skilled therapeutic intervention in order to improve the following deficits and impairments:  Hypomobility, Increased edema, Decreased scar mobility, Decreased activity tolerance, Decreased strength, Pain, Difficulty walking, Decreased balance, Decreased range of motion, Improper body mechanics, Postural dysfunction, Impaired flexibility  Visit Diagnosis: Acute pain of left knee  Stiffness of left knee, not elsewhere classified  Stiffness of right knee, not elsewhere classified  Difficulty in walking, not elsewhere classified  Other symptoms and signs involving the musculoskeletal system     Problem List Patient Active Problem List   Diagnosis Date Noted  . Primary osteoarthritis of left knee 11/19/2018  . OSA (obstructive sleep apnea) 06/02/2018  . 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 Ingrown toenail 05/08/2018  . Onychomycosis 05/08/2018  . Stroke (cerebrum) ([place]) 01/03/2018  . Acute metabolic encephalopathy 97/41/6384  . Tobacco abuse 01/03/2018  . Acute respiratory failure ([place]) 01/19/2017  . Community acquired pneumonia 01/18/2017  . Influenza with pneumonia 01/18/2017  . Indeterminate  pulmonary nodules 07/23/2015  . Obese 05/20/2015  . Undiagnosed cardiac murmurs 04/15/2015  . Asthma with acute exacerbation 03/13/2014  . Hx of adenomatous colonic polyps 05/08/2013  . Barrett's esophagus 05/03/2013  . Chronic diarrhea 04/02/2013  . HTN (hypertension) 04/02/2013  . Hx of low back pain 09/26/2012  . Arthralgia of right knee 05/02/2012  . Asthma 12/13/2011  . Benign prostatic hyperplasia 12/13/2011  . Hyperlipidemia 12/13/2011  . GERD (gastroesophageal reflux disease) 12/13/2011  . Hypothyroid   . Polycystic kidney disease     Bess Harvest, [place] 02/20/19 4:14 PM   [facility] [address]  [place], [place], [address] Phone: [phone]   Fax:  [phone]  Name: Kyle Blair MRN: [id number] Date of Birth: 1961/01/13

## 2019-02-22 ENCOUNTER — Encounter: Payer: Self-pay | Admitting: Physical Therapy

## 2019-02-22 ENCOUNTER — Ambulatory Visit: Payer: Medicare HMO | Admitting: Physical Therapy

## 2019-02-22 DIAGNOSIS — R262 Difficulty in walking, not elsewhere classified: Secondary | ICD-10-CM | POA: Diagnosis not present

## 2019-02-22 DIAGNOSIS — M25661 Stiffness of right knee, not elsewhere classified: Secondary | ICD-10-CM | POA: Diagnosis not present

## 2019-02-22 DIAGNOSIS — M25662 Stiffness of left knee, not elsewhere classified: Secondary | ICD-10-CM

## 2019-02-22 DIAGNOSIS — R29898 Other symptoms and signs involving the musculoskeletal system: Secondary | ICD-10-CM | POA: Diagnosis not present

## 2019-02-22 DIAGNOSIS — M25562 Pain in left knee: Secondary | ICD-10-CM

## 2019-02-22 NOTE — Therapy (Signed)
[facility] [address]  [place], [place], [address] Phone: [phone]   Fax:  [phone]  Physical Therapy Treatment  Patient Details  Name: Kyle Blair MRN: [id number] Date of Birth: 10/18/1961 Referring Provider (PT): Dorna Leitz, MD   Encounter Date: 02/22/2019  PT End of Session - 02/22/19 1056    Visit Number  12    Number of Visits  19    Date for PT Re-Evaluation  03/05/19    Authorization Type  [employer] Medicare    PT Start Time  1013    PT Stop Time  1056    PT Time Calculation (min)  43 min    Activity Tolerance  Patient tolerated treatment well;Patient limited by pain    Behavior During Therapy  [facility] for tasks assessed/performed       Past Medical History:  Diagnosis Date  . Arthritis    severe; pt takes strong pain meds daily/legs  . Asthma    MONTH AGO  . BPH (benign prostatic hypertrophy)   . Diverticulosis   . GERD with stricture   . Hiatal hernia   . History of stroke 2004  . Hyperlipidemia   . Hypertension   . Hypothyroid   . Lung nodule 02/03/2012  . Osteoarthritis of left knee   . Polycystic kidney disease   . Stroke ([place])    2004   EQULIBRIUM, AND JUDGING DISTANCE  . Tubular adenoma of colon     Past Surgical History:  Procedure Laterality Date  . CARPAL TUNNEL RELEASE    . COLONOSCOPY    . HERNIA REPAIR  2005   with mesh  . KNEE ARTHROSCOPY Bilateral 12/05/13   cyst and bone spur removed from left knee per pt.  Marland Kitchen KNEE ARTHROSCOPY Left 01/2015  . NASAL POLYP EXCISION  1992 or 93  . POLYPECTOMY    . TOTAL KNEE ARTHROPLASTY Right 05/19/2015   Procedure: RIGHT TOTAL KNEE ARTHROPLASTY;  Surgeon: Paralee Cancel, MD;  Location: [facility];  Service: Orthopedics;  Laterality: Right;  . TOTAL KNEE ARTHROPLASTY Left 11/19/2018   Procedure: LEFT  TOTAL KNEE ARTHROPLASTY;  Surgeon: Dorna Leitz, MD;  Location: [facility];  Service: Orthopedics;  Laterality: Left;    There were no vitals  filed for this visit.  Subjective Assessment - 02/22/19 1014    Subjective  Reports that he was really sick- asthma was acting up. Believes he is on the mend. No SOB currently. Notes that he has been wobbly on his R knee lately.     Pertinent History  stroke 2004, polycystic kidney disease, OS L knee, lung nodule, hypothyroid, HTN, HLD, hiatal hernia, GERD, diverticulosis, asthma, B TKA, carpal tunnel release    Patient Stated Goals  bend my knee    Currently in Pain?  Yes    Pain Score  2     Pain Orientation  Left    Pain Descriptors / Indicators  --   stiffness   Pain Type  Acute pain;Surgical pain                       [facility] Adult PT Treatment/Exercise - 02/22/19 0001      Ambulation/Gait   Ambulation/Gait  Yes    Ambulation/Gait Assistance  5: Supervision    Ambulation Distance (Feet)  120 Feet    Assistive device  Straight cane    Gait Pattern  Step-through pattern;Step-to pattern;Decreased stance time - left;Decreased hip/knee  flexion - left;Decreased weight shift to left;Trunk flexed;Poor foot clearance - left;Poor foot clearance - right    Gait Comments  gait training with SPC- cues for increased L knee flexion at toe off, L knee extension at heel strike      Self-Care   Self-Care  Other Self-Care Comments    Other Self-Care Comments   instruction and practice with L knee extension stretch on chair for 2 min      Knee/Hip Exercises: Stretches   Quad Stretch  Left;2 reps;30 seconds    Quad Stretch Limitations  prone quad stretch     Other Knee/Hip Stretches  L knee flexion stretch on step at counter top 10x5"      Knee/Hip Exercises: Aerobic   Recumbent Bike  L1 x 6 min partial revolutions      Knee/Hip Exercises: Standing   Terminal Knee Extension  Left;Theraband;Strengthening;10 reps    Theraband Level (Terminal Knee Extension)  Level 4 (Blue)    Terminal Knee Extension Limitations  10x3" TKE + DF with 1 UE support on chair    Functional Squat  1  set;20 reps   cues not to hang on sink with UEs   Functional Squat Limitations  at counter top; to chair      Knee/Hip Exercises: Supine   Quad Sets  Strengthening;1 set;10 reps;Left    Quad Sets Limitations  10x10" ankle on 1/2 bolster   cues to breathe     Manual Therapy   Manual Therapy  Muscle Energy Technique;Passive ROM    Joint Mobilization  L knee extension mobs grade III/IV to tolerance with ankle elevated on 1/2 bolster   cues to decrease muscle guarding   Passive ROM  L knee flexion stretch 5x20" after MET    Muscle Energy Technique  5 cycles of 5 sec MET to L quad for increased L knee flexion             PT Education - 02/22/19 1056    Education Details  update to HEP    Person(s) Educated  Patient    Methods  Explanation;Demonstration;Tactile cues;Verbal cues;Handout    Comprehension  Verbalized understanding;Returned demonstration       PT Short Term Goals - 02/15/19 1059      PT SHORT TERM GOAL #1   Title  Patient to be independent with initial HEP.    Time  3    Period  Weeks    Status  Achieved        PT Long Term Goals - 02/15/19 1100      PT LONG TERM GOAL #1   Title  Patient to be independent with advanced HEP.    Time  6    Period  Weeks    Status  Partially Met   met for current     PT LONG TERM GOAL #2   Title  Patient to demonstrate B knee AROM/PROM 0-110 degrees.    Time  6    Period  Weeks    Status  Partially Met   L knee AROM 6-80 degrees, PROM 5-85 degrees     PT LONG TERM GOAL #3   Title  Patient to demonstrate B LE strength >=4+/5.    Time  6    Period  Weeks    Status  Partially Met   Improvements demonstrated in R knee extension, B ankle DF, and R ankle PF strength, most limited in R HS strength     PT LONG  TERM GOAL #4   Title  Patient to score <14 sec on TUG with LRAD to decrease risk of falls.    Time  6    Period  Weeks    Status  Partially Met   14.7 sec with SPC, 16.0 without AD     PT LONG TERM GOAL #5    Title  Patient to demonstrate symmetrical weight shift, step length, and knee flexion throughout ambulation with LRAD.     Time  6    Period  Weeks    Status  On-going   ambulating with decreased step length on R LE., stance time on L LE, and decreased L knee flexion with [facility]           Plan - 02/22/19 1057    Clinical Impression Statement  Patient arrived to session with report of asthma exacerbations causing him to feel ill earlier this week. Confirms that he is now feeling better and not having SOB. Tolerated MET to L quad with passive L knee flexion stretch with good observable improvement in ROM. Patient still with difficulty with muscle guarding with L knee extension mobilizations, but with good effort to relax. Reinforced L knee flexion at toe off and knee extension at heel strike during gait training with SPC as patient still with tendency to walk stiff-legged on L LE. Good effort to reach increased flexion ROM with squats today. Updated HEP with knee extension stretch. Patient reported understanding and with no complaints at end of session.     Clinical Impairments Affecting Rehab Potential  stroke 2004, polycystic kidney disease, OS L knee, lung nodule, hypothyroid, HTN, HLD, hiatal hernia, GERD, diverticulosis, asthma, B TKA, carpal tunnel release    PT Treatment/Interventions  ADLs/Self Care Home Management;Cryotherapy;Electrical Stimulation;Functional mobility training;Stair training;Gait training;DME Instruction;Ultrasound;Moist Heat;Therapeutic activities;Therapeutic exercise;Balance training;Neuromuscular re-education;Patient/family education;Passive range of motion;Scar mobilization;Manual techniques;Dry needling;Energy conservation;Splinting;Taping;Vasopneumatic Device    PT Next Visit Plan  progress L knee ROM    Consulted and Agree with Plan of Care  Patient       Patient will benefit from skilled therapeutic intervention in order to improve the following deficits and  impairments:  Hypomobility, Increased edema, Decreased scar mobility, Decreased activity tolerance, Decreased strength, Pain, Difficulty walking, Decreased balance, Decreased range of motion, Improper body mechanics, Postural dysfunction, Impaired flexibility  Visit Diagnosis: Acute pain of left knee  Stiffness of left knee, not elsewhere classified  Stiffness of right knee, not elsewhere classified  Difficulty in walking, not elsewhere classified  Other symptoms and signs involving the musculoskeletal system     Problem List Patient Active Problem List   Diagnosis Date Noted  . Primary osteoarthritis of left knee 11/19/2018  . OSA (obstructive sleep apnea) 06/02/2018  . 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 Ingrown toenail 05/08/2018  . Onychomycosis 05/08/2018  . Stroke (cerebrum) ([place]) 01/03/2018  . Acute metabolic encephalopathy 40/98/1191  . Tobacco abuse 01/03/2018  . Acute respiratory failure ([place]) 01/19/2017  . Community acquired pneumonia 01/18/2017  . Influenza with pneumonia 01/18/2017  . Indeterminate pulmonary nodules 07/23/2015  . Obese 05/20/2015  . Undiagnosed cardiac murmurs 04/15/2015  . Asthma with acute exacerbation 03/13/2014  . Hx of adenomatous colonic polyps 05/08/2013  . Barrett's esophagus 05/03/2013  . Chronic diarrhea 04/02/2013  . HTN (hypertension) 04/02/2013  . Hx of low back pain 09/26/2012  . Arthralgia of right knee 05/02/2012  . Asthma 12/13/2011  . Benign prostatic hyperplasia 12/13/2011  . Hyperlipidemia 12/13/2011  . GERD (gastroesophageal reflux disease) 12/13/2011  .  Hypothyroid   . Polycystic kidney disease      Janene Harvey, PT, DPT 02/22/19 10:59 AM   [facility] [address]  [place], [place], [address] Phone: [phone]   Fax:  [phone]  Name: Kyle Blair MRN: [id number] Date of Birth: [birth year]

## 2019-02-25 ENCOUNTER — Ambulatory Visit: Payer: Medicare HMO | Attending: Orthopedic Surgery | Admitting: Physical Therapy

## 2019-02-25 ENCOUNTER — Encounter: Payer: Self-pay | Admitting: Physical Therapy

## 2019-02-25 DIAGNOSIS — M25662 Stiffness of left knee, not elsewhere classified: Secondary | ICD-10-CM | POA: Diagnosis not present

## 2019-02-25 DIAGNOSIS — R262 Difficulty in walking, not elsewhere classified: Secondary | ICD-10-CM

## 2019-02-25 DIAGNOSIS — M25562 Pain in left knee: Secondary | ICD-10-CM | POA: Diagnosis not present

## 2019-02-25 DIAGNOSIS — M25661 Stiffness of right knee, not elsewhere classified: Secondary | ICD-10-CM

## 2019-02-25 DIAGNOSIS — R29898 Other symptoms and signs involving the musculoskeletal system: Secondary | ICD-10-CM | POA: Insufficient documentation

## 2019-02-25 NOTE — Therapy (Signed)
[facility] [address]  [place], [place], [address] Phone: [phone]   Fax:  [phone]  Physical Therapy Treatment  Patient Details  Name: Kyle Blair MRN: [id number] Date of Birth: June 09, 1961 Referring Provider (PT): Dorna Leitz, MD   Encounter Date: 02/25/2019  PT End of Session - 02/25/19 1147    Visit Number  13    Number of Visits  19    Date for PT Re-Evaluation  03/05/19    Authorization Type  [employer]    PT Start Time  1057    PT Stop Time  1141    PT Time Calculation (min)  44 min    Activity Tolerance  Patient tolerated treatment well;Patient limited by pain    Behavior During Therapy  [facility] for tasks assessed/performed       Past Medical History:  Diagnosis Date  . Arthritis    severe; pt takes strong pain meds daily/legs  . Asthma    MONTH AGO  . BPH (benign prostatic hypertrophy)   . Diverticulosis   . GERD with stricture   . Hiatal hernia   . History of stroke 2004  . Hyperlipidemia   . Hypertension   . Hypothyroid   . Lung nodule 02/03/2012  . Osteoarthritis of left knee   . Polycystic kidney disease   . Stroke ([place])    2004   EQULIBRIUM, AND JUDGING DISTANCE  . Tubular adenoma of colon     Past Surgical History:  Procedure Laterality Date  . CARPAL TUNNEL RELEASE    . COLONOSCOPY    . HERNIA REPAIR  2005   with mesh  . KNEE ARTHROSCOPY Bilateral 12/05/13   cyst and bone spur removed from left knee per pt.  Marland Kitchen KNEE ARTHROSCOPY Left 01/2015  . NASAL POLYP EXCISION  1992 or 93  . POLYPECTOMY    . TOTAL KNEE ARTHROPLASTY Right 05/19/2015   Procedure: RIGHT TOTAL KNEE ARTHROPLASTY;  Surgeon: Paralee Cancel, MD;  Location: [facility];  Service: Orthopedics;  Laterality: Right;  . TOTAL KNEE ARTHROPLASTY Left 11/19/2018   Procedure: LEFT  TOTAL KNEE ARTHROPLASTY;  Surgeon: Dorna Leitz, MD;  Location: [place];  Service: Orthopedics;  Laterality: Left;    There were no vitals  filed for this visit.  Subjective Assessment - 02/25/19 1058    Subjective  Reports that he was busy over the weekend and was doing things he probably shouldn't have been doing- yard work. Notes that he doesn't have anyone else to do it. Yesterday he went down on the toilet a little too fast and hit his side on the wall.  Notes 75% improvement with PT. Notes he is happy with progress but still having stiffness in L LE.     Pertinent History  stroke 2004, polycystic kidney disease, OS L knee, lung nodule, hypothyroid, HTN, HLD, hiatal hernia, GERD, diverticulosis, asthma, B TKA, carpal tunnel release    Patient Stated Goals  bend my knee    Currently in Pain?  No/denies         [facility] PT Assessment - 02/25/19 0001      AROM   Right Knee Extension  5    Right Knee Flexion  86    Left Knee Extension  4    Left Knee Flexion  84      PROM   Right Knee Extension  5    Right Knee Flexion  93    Left Knee  Extension  3    Left Knee Flexion  85      Strength   Right Hip Flexion  4+/5    Right Hip ABduction  4+/5    Right Hip ADduction  4+/5    Left Hip Flexion  4+/5    Left Hip ABduction  4+/5    Left Hip ADduction  4+/5    Right Knee Flexion  4+/5    Right Knee Extension  4+/5    Left Knee Flexion  4+/5    Left Knee Extension  4+/5      Timed Up and Go Test   TUG  Normal TUG    Normal TUG (seconds)  --   10.4 sec w/ SPC, 10.2 sec without AD                  [facility] Adult PT Treatment/Exercise - 02/25/19 0001      Ambulation/Gait   Ambulation/Gait  Yes    Ambulation/Gait Assistance  5: Supervision    Ambulation Distance (Feet)  130 Feet    Assistive device  Straight cane    Gait Pattern  Step-through pattern;Step-to pattern;Decreased stance time - left;Decreased hip/knee flexion - left;Decreased weight shift to left;Trunk flexed;Poor foot clearance - left;Poor foot clearance - right    Gait Comments  gait training with SPC with cues to increase L knee flexion; gait  training with SPC stepping over obstacles with L LE to encourage L knee fleixon      Knee/Hip Exercises: Stretches   Passive Hamstring Stretch  Left;2 reps;30 seconds    Passive Hamstring Stretch Limitations  supine with strap    Other Knee/Hip Stretches  L knee flexion stretch at counter top 10x5"      Knee/Hip Exercises: Aerobic   Recumbent Bike  L1 x 6 min partial revolutions      Knee/Hip Exercises: Standing   Functional Squat  2 sets;10 reps   cues to bring feet wider   Functional Squat Limitations  at counter top; to chair      Manual Therapy   Manual Therapy  Joint mobilization    Joint Mobilization  L knee extension mobs grade III/IV to tolerance with ankle elevated on 1/2 bolster; L knee flexion mobilizations grade IV to tolerance with seatbelt; L patellar mobilizations in all planes grade III/IV             PT Education - 02/25/19 1147    Education Details  discussion of objective progress with PT    Person(s) Educated  Patient    Methods  Explanation    Comprehension  Verbalized understanding       PT Short Term Goals - 02/25/19 1102      PT SHORT TERM GOAL #1   Title  Patient to be independent with initial HEP.    Time  3    Period  Weeks    Status  Achieved        PT Long Term Goals - 02/25/19 1102      PT LONG TERM GOAL #1   Title  Patient to be independent with advanced HEP.    Time  6    Period  Weeks    Status  Partially Met   met for current     PT LONG TERM GOAL #2   Title  Patient to demonstrate B knee AROM/PROM 0-110 degrees.    Time  6    Period  Weeks    Status  Partially  Met   L knee AROM 4-84 degrees, PROM 3-85 degrees; R AROM 5-86 degrees, PROM 5-93 degrees     PT LONG TERM GOAL #3   Title  Patient to demonstrate B LE strength >=4+/5.    Time  6    Period  Weeks    Status  Achieved      PT LONG TERM GOAL #4   Title  Patient to score <14 sec on TUG with LRAD to decrease risk of falls.    Time  6    Period  Weeks     Status  Achieved   10.4 sec with SPC, 10.2 without AD     PT LONG TERM GOAL #5   Title  Patient to demonstrate symmetrical weight shift, step length, and knee flexion throughout ambulation with LRAD.     Time  6    Period  Weeks    Status  Partially Met   patient ambulating with improvement in step length, weight shift, and gait speed, however still requires cues to increase L knee flexion at toe-off           Plan - 02/25/19 1148    Clinical Impression Statement  Patient arrived to session with report of 75% improvement in B knee since initial eval. Notes that he still notices stiffness in L knee, however is back to performing most of his daily activities. Does admit to performing yardwork over the weekend- advised patient to avoid performing activities that may lead to falls and to bring his [facility] with him if he has to walk on uneven surfaces. Patient agreeable.  Patient has demonstrated improvements in LE strength, now meeting strength goal. Tolerated joint mobilizations to L knee for improved knee flexion and extension. With measurements reaching 4-84 degrees L knee AROM and 3-85 degrees PROM after manual therapy. R knee ROM has also improved since initial eval. Patient has met TUG goal, placing him at a decreased risk of falls. Patient ambulating with improvement in step length, weight shift, and gait speed, however still requires cues to increase L knee flexion at toe-off. Ended session with no complaints. Patient showing good progress with PT thus far.     Clinical Impairments Affecting Rehab Potential  stroke 2004, polycystic kidney disease, OS L knee, lung nodule, hypothyroid, HTN, HLD, hiatal hernia, GERD, diverticulosis, asthma, B TKA, carpal tunnel release    PT Treatment/Interventions  ADLs/Self Care Home Management;Cryotherapy;Electrical Stimulation;Functional mobility training;Stair training;Gait training;DME Instruction;Ultrasound;Moist Heat;Therapeutic activities;Therapeutic  exercise;Balance training;Neuromuscular re-education;Patient/family education;Passive range of motion;Scar mobilization;Manual techniques;Dry needling;Energy conservation;Splinting;Taping;Vasopneumatic Device    PT Next Visit Plan  progress L knee ROM    Consulted and Agree with Plan of Care  Patient       Patient will benefit from skilled therapeutic intervention in order to improve the following deficits and impairments:  Hypomobility, Increased edema, Decreased scar mobility, Decreased activity tolerance, Decreased strength, Pain, Difficulty walking, Decreased balance, Decreased range of motion, Improper body mechanics, Postural dysfunction, Impaired flexibility  Visit Diagnosis: Acute pain of left knee  Stiffness of left knee, not elsewhere classified  Stiffness of right knee, not elsewhere classified  Difficulty in walking, not elsewhere classified  Other symptoms and signs involving the musculoskeletal system     Problem List Patient Active Problem List   Diagnosis Date Noted  . Primary osteoarthritis of left knee 11/19/2018  . OSA (obstructive sleep apnea) 06/02/2018  . 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 Ingrown toenail 05/08/2018  . Onychomycosis 05/08/2018  . Stroke (cerebrum) ([place])  01/03/2018  . Acute metabolic encephalopathy 65/78/4696  . Tobacco abuse 01/03/2018  . Acute respiratory failure ([place]) 01/19/2017  . Community acquired pneumonia 01/18/2017  . Influenza with pneumonia 01/18/2017  . Indeterminate pulmonary nodules 07/23/2015  . Obese 05/20/2015  . Undiagnosed cardiac murmurs 04/15/2015  . Asthma with acute exacerbation 03/13/2014  . Hx of adenomatous colonic polyps 05/08/2013  . Barrett's esophagus 05/03/2013  . Chronic diarrhea 04/02/2013  . HTN (hypertension) 04/02/2013  . Hx of low back pain 09/26/2012  . Arthralgia of right knee 05/02/2012  . Asthma 12/13/2011  . Benign prostatic hyperplasia 12/13/2011  . Hyperlipidemia 12/13/2011  . GERD  (gastroesophageal reflux disease) 12/13/2011  . Hypothyroid   . Polycystic kidney disease      Janene Harvey, PT, DPT 02/25/19 11:49 AM   [facility] [address]  [place], [place], [address] Phone: [phone]   Fax:  [phone]  Name: Kyle Blair MRN: [id number] Date of Birth: 10/12/1961

## 2019-02-28 ENCOUNTER — Ambulatory Visit: Payer: Medicare HMO

## 2019-02-28 DIAGNOSIS — M1712 Unilateral primary osteoarthritis, left knee: Secondary | ICD-10-CM | POA: Diagnosis not present

## 2019-02-28 DIAGNOSIS — R262 Difficulty in walking, not elsewhere classified: Secondary | ICD-10-CM | POA: Diagnosis not present

## 2019-02-28 DIAGNOSIS — R29898 Other symptoms and signs involving the musculoskeletal system: Secondary | ICD-10-CM

## 2019-02-28 DIAGNOSIS — M25662 Stiffness of left knee, not elsewhere classified: Secondary | ICD-10-CM | POA: Diagnosis not present

## 2019-02-28 DIAGNOSIS — M25562 Pain in left knee: Secondary | ICD-10-CM

## 2019-02-28 DIAGNOSIS — M25661 Stiffness of right knee, not elsewhere classified: Secondary | ICD-10-CM | POA: Diagnosis not present

## 2019-02-28 NOTE — Therapy (Signed)
[facility] [address]  [place], [place], [address] Phone: [phone]   Fax:  [phone]  Physical Therapy Treatment  Patient Details  Name: Kyle Blair MRN: [id number] Date of Birth: 1961-10-05 Referring Provider (PT): Dorna Leitz, MD   Encounter Date: 02/28/2019  PT End of Session - 02/28/19 1319    Visit Number  14    Number of Visits  19    Date for PT Re-Evaluation  03/05/19    Authorization Type  [employer]na Medicare    PT Start Time  8592    PT Stop Time  1409   ended visit with 10 min ice pack to L kne e   PT Time Calculation (min)  56 min    Activity Tolerance  Patient tolerated treatment well;Patient limited by pain    Behavior During Therapy  [facility] for tasks assessed/performed       Past Medical History:  Diagnosis Date  . Arthritis    severe; pt takes strong pain meds daily/legs  . Asthma    MONTH AGO  . BPH (benign prostatic hypertrophy)   . Diverticulosis   . GERD with stricture   . Hiatal hernia   . History of stroke 2004  . Hyperlipidemia   . Hypertension   . Hypothyroid   . Lung nodule 02/03/2012  . Osteoarthritis of left knee   . Polycystic kidney disease   . Stroke ([place])    2004   EQULIBRIUM, AND JUDGING DISTANCE  . Tubular adenoma of colon     Past Surgical History:  Procedure Laterality Date  . CARPAL TUNNEL RELEASE    . COLONOSCOPY    . HERNIA REPAIR  2005   with mesh  . KNEE ARTHROSCOPY Bilateral 12/05/13   cyst and bone spur removed from left knee per pt.  Marland Kitchen KNEE ARTHROSCOPY Left 01/2015  . NASAL POLYP EXCISION  1992 or 93  . POLYPECTOMY    . TOTAL KNEE ARTHROPLASTY Right 05/19/2015   Procedure: RIGHT TOTAL KNEE ARTHROPLASTY;  Surgeon: Paralee Cancel, MD;  Location: [facility];  Service: Orthopedics;  Laterality: Right;  . TOTAL KNEE ARTHROPLASTY Left 11/19/2018   Procedure: LEFT  TOTAL KNEE ARTHROPLASTY;  Surgeon: Dorna Leitz, MD;  Location: [place];  Service: Orthopedics;   Laterality: Left;    There were no vitals filed for this visit.  Subjective Assessment - 02/28/19 1318    Subjective  Pt. noting he is able to navigate the house better now.      Pertinent History  stroke 2004, polycystic kidney disease, OS L knee, lung nodule, hypothyroid, HTN, HLD, hiatal hernia, GERD, diverticulosis, asthma, B TKA, carpal tunnel release    Patient Stated Goals  bend my knee    Currently in Pain?  Yes    Pain Score  3     Pain Location  Knee    Pain Orientation  Left    Pain Descriptors / Indicators  --   "stiffness"   Pain Type  Acute pain;Surgical pain    Multiple Pain Sites  No         [facility] PT Assessment - 02/28/19 0001      Assessment   Medical Diagnosis  s/p R TKA     Referring Provider (PT)  Dorna Leitz, MD    Onset Date/Surgical Date  01/21/19    Hand Dominance  Right    Next MD Visit  04/11/19    Prior Therapy  Yes- for  R knee                   [facility] Adult PT Treatment/Exercise - 02/28/19 1334      Knee/Hip Exercises: Aerobic   Nustep  6 min, lvl 3, (UE/LE), seated set on lvl 8 for flexion stretch       Knee/Hip Exercises: Machines for Strengthening   Cybex Knee Extension  B LE's: 25# 2 x 10 reps     Cybex Knee Flexion  B LE's: 25# 2 x 10 reps     Cybex Leg Press  B LE's: 30# x 20 reps       Knee/Hip Exercises: Standing   Heel Raises  Both;20 reps    Functional Squat  1 set;15 reps;3 seconds    Functional Squat Limitations  TRX       Cryotherapy   Number Minutes Cryotherapy  10 Minutes    Cryotherapy Location  Knee   L with L heel proper on bolster and band preventing hip ER   Type of Cryotherapy  Ice pack      Manual Therapy   Manual Therapy  Joint mobilization    Joint Mobilization  L knee extension mobs grade III/IV to tolerance with ankle elevated on 1/2 bolster               PT Short Term Goals - 02/25/19 1102      PT SHORT TERM GOAL #1   Title  Patient to be independent with initial HEP.    Time  3     Period  Weeks    Status  Achieved        PT Long Term Goals - 02/25/19 1102      PT LONG TERM GOAL #1   Title  Patient to be independent with advanced HEP.    Time  6    Period  Weeks    Status  Partially Met   met for current     PT LONG TERM GOAL #2   Title  Patient to demonstrate B knee AROM/PROM 0-110 degrees.    Time  6    Period  Weeks    Status  Partially Met   L knee AROM 4-84 degrees, PROM 3-85 degrees; R AROM 5-86 degrees, PROM 5-93 degrees     PT LONG TERM GOAL #3   Title  Patient to demonstrate B LE strength >=4+/5.    Time  6    Period  Weeks    Status  Achieved      PT LONG TERM GOAL #4   Title  Patient to score <14 sec on TUG with LRAD to decrease risk of falls.    Time  6    Period  Weeks    Status  Achieved   10.4 sec with SPC, 10.2 without AD     PT LONG TERM GOAL #5   Title  Patient to demonstrate symmetrical weight shift, step length, and knee flexion throughout ambulation with LRAD.     Time  6    Period  Weeks    Status  Partially Met   patient ambulating with improvement in step length, weight shift, and gait speed, however still requires cues to increase L knee flexion at toe-off           Plan - 02/28/19 1320    Clinical Impression Statement  Pt. seen initially with PT order from MD for continue therapy for 2-4 weeks for 2-3x/week frequency.  Able  to demo some visible improvement in L knee extension ROM today following manual therapy and TKE/LE strengthening activities.  Progressed machine strengthening with cueing for TKE and flexion stretch without issue today with pt. responded very well to frequent verbal cueing with good carryover.  Ended visit with ice pack applied to L knee to reduce visible swelling at lateral knee with positioning heel proper on bolster in supine to encourage L knee extension stretch.  Will continue to progress toward goals.      Clinical Impairments Affecting Rehab Potential  stroke 2004, polycystic kidney disease,  OS L knee, lung nodule, hypothyroid, HTN, HLD, hiatal hernia, GERD, diverticulosis, asthma, B TKA, carpal tunnel release    PT Treatment/Interventions  ADLs/Self Care Home Management;Cryotherapy;Electrical Stimulation;Functional mobility training;Stair training;Gait training;DME Instruction;Ultrasound;Moist Heat;Therapeutic activities;Therapeutic exercise;Balance training;Neuromuscular re-education;Patient/family education;Passive range of motion;Scar mobilization;Manual techniques;Dry needling;Energy conservation;Splinting;Taping;Vasopneumatic Device    PT Next Visit Plan  progress L knee ROM    Consulted and Agree with Plan of Care  Patient       Patient will benefit from skilled therapeutic intervention in order to improve the following deficits and impairments:  Hypomobility, Increased edema, Decreased scar mobility, Decreased activity tolerance, Decreased strength, Pain, Difficulty walking, Decreased balance, Decreased range of motion, Improper body mechanics, Postural dysfunction, Impaired flexibility  Visit Diagnosis: Acute pain of left knee  Stiffness of left knee, not elsewhere classified  Stiffness of right knee, not elsewhere classified  Difficulty in walking, not elsewhere classified  Other symptoms and signs involving the musculoskeletal system     Problem List Patient Active Problem List   Diagnosis Date Noted  . Primary osteoarthritis of left knee 11/19/2018  . OSA (obstructive sleep apnea) 06/02/2018  . 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 Ingrown toenail 05/08/2018  . Onychomycosis 05/08/2018  . Stroke (cerebrum) ([place]) 01/03/2018  . Acute metabolic encephalopathy 33/54/5625  . Tobacco abuse 01/03/2018  . Acute respiratory failure ([place]) 01/19/2017  . Community acquired pneumonia 01/18/2017  . Influenza with pneumonia 01/18/2017  . Indeterminate pulmonary nodules 07/23/2015  . Obese 05/20/2015  . Undiagnosed cardiac murmurs 04/15/2015  . Asthma with acute  exacerbation 03/13/2014  . Hx of adenomatous colonic polyps 05/08/2013  . Barrett's esophagus 05/03/2013  . Chronic diarrhea 04/02/2013  . HTN (hypertension) 04/02/2013  . Hx of low back pain 09/26/2012  . Arthralgia of right knee 05/02/2012  . Asthma 12/13/2011  . Benign prostatic hyperplasia 12/13/2011  . Hyperlipidemia 12/13/2011  . GERD (gastroesophageal reflux disease) 12/13/2011  . Hypothyroid   . Polycystic kidney disease     Bess Harvest, [place] 02/28/19 3:16 PM   [facility] [address]  [place], [place], [address] Phone: [phone]   Fax:  [phone]  Name: Kyle Blair MRN: [id number] Date of Birth: May 21, 1961

## 2019-03-01 DIAGNOSIS — M545 Low back pain: Secondary | ICD-10-CM | POA: Diagnosis not present

## 2019-03-01 DIAGNOSIS — G894 Chronic pain syndrome: Secondary | ICD-10-CM | POA: Diagnosis not present

## 2019-03-01 DIAGNOSIS — M179 Osteoarthritis of knee, unspecified: Secondary | ICD-10-CM | POA: Diagnosis not present

## 2019-03-04 ENCOUNTER — Ambulatory Visit: Payer: Medicare HMO

## 2019-03-04 DIAGNOSIS — R262 Difficulty in walking, not elsewhere classified: Secondary | ICD-10-CM | POA: Diagnosis not present

## 2019-03-04 DIAGNOSIS — M25562 Pain in left knee: Secondary | ICD-10-CM | POA: Diagnosis not present

## 2019-03-04 DIAGNOSIS — R29898 Other symptoms and signs involving the musculoskeletal system: Secondary | ICD-10-CM | POA: Diagnosis not present

## 2019-03-04 DIAGNOSIS — M25661 Stiffness of right knee, not elsewhere classified: Secondary | ICD-10-CM | POA: Diagnosis not present

## 2019-03-04 DIAGNOSIS — M25662 Stiffness of left knee, not elsewhere classified: Secondary | ICD-10-CM | POA: Diagnosis not present

## 2019-03-04 NOTE — Therapy (Addendum)
[facility] [address]  [place], [place], [address] Phone: [phone]   Fax:  [phone]  Physical Therapy Treatment  Patient Details  Name: Kyle Blair MRN: [id number] Date of Birth: [birth year] Referring Provider (PT): Dorna Leitz, MD   Encounter Date: 03/04/2019  PT End of Session - 03/04/19 1107    Visit Number  15    Number of Visits  21    Date for PT Re-Evaluation  03/25/19    Authorization Type  [employer]    PT Start Time  1059    PT Stop Time  1143    PT Time Calculation (min)  44 min    Activity Tolerance  Patient tolerated treatment well;Patient limited by pain    Behavior During Therapy  [facility] for tasks assessed/performed       Past Medical History:  Diagnosis Date  . Arthritis    severe; pt takes strong pain meds daily/legs  . Asthma    MONTH AGO  . BPH (benign prostatic hypertrophy)   . Diverticulosis   . GERD with stricture   . Hiatal hernia   . History of stroke 2004  . Hyperlipidemia   . Hypertension   . Hypothyroid   . Lung nodule 02/03/2012  . Osteoarthritis of left knee   . Polycystic kidney disease   . Stroke ([place])    2004   EQULIBRIUM, AND JUDGING DISTANCE  . Tubular adenoma of colon     Past Surgical History:  Procedure Laterality Date  . CARPAL TUNNEL RELEASE    . COLONOSCOPY    . HERNIA REPAIR  2005   with mesh  . KNEE ARTHROSCOPY Bilateral 12/05/13   cyst and bone spur removed from left knee per pt.  Marland Kitchen KNEE ARTHROSCOPY Left 01/2015  . NASAL POLYP EXCISION  1992 or 93  . POLYPECTOMY    . TOTAL KNEE ARTHROPLASTY Right 05/19/2015   Procedure: RIGHT TOTAL KNEE ARTHROPLASTY;  Surgeon: Paralee Cancel, MD;  Location: [facility];  Service: Orthopedics;  Laterality: Right;  . TOTAL KNEE ARTHROPLASTY Left 11/19/2018   Procedure: LEFT  TOTAL KNEE ARTHROPLASTY;  Surgeon: Dorna Leitz, MD;  Location: [facility];  Service: Orthopedics;  Laterality: Left;    There were no vitals  filed for this visit.  Subjective Assessment - 03/04/19 1121    Subjective  Doing well with no new complaints.      Pertinent History  stroke 2004, polycystic kidney disease, OS L knee, lung nodule, hypothyroid, HTN, HLD, hiatal hernia, GERD, diverticulosis, asthma, B TKA, carpal tunnel release    Patient Stated Goals  bend my knee    Currently in Pain?  Yes    Pain Score  3     Pain Location  Knee    Pain Orientation  Left    Pain Descriptors / Indicators  Tightness    Pain Type  Acute pain;Surgical pain    Multiple Pain Sites  No         [facility] PT Assessment - 03/04/19 0001      Assessment   Medical Diagnosis  s/p R TKA     Referring Provider (PT)  Dorna Leitz, MD    Onset Date/Surgical Date  01/21/19    Hand Dominance  Right    Next MD Visit  04/11/19    Prior Therapy  Yes- for R knee      AROM   Right/Left Knee  Left    Left Knee  Extension  4    Left Knee Flexion  85      PROM   Right/Left Knee  Left    Left Knee Extension  3    Left Knee Flexion  87   Heavy overpressure provided into flexion stretch                  [facility] Adult PT Treatment/Exercise - 03/04/19 1138      Knee/Hip Exercises: Stretches   Quad Stretch  Left;1 rep;60 seconds    Quad Stretch Limitations  prone with strap     Other Knee/Hip Stretches  L knee flexion stretch with foot on 8" step at counter top 10x5"      Knee/Hip Exercises: Aerobic   Nustep  6 min, lvl 3, (UE/LE), seated set on lvl 8 for flexion stretch       Knee/Hip Exercises: Machines for Strengthening   Cybex Leg Press  B LE's: 35# 5" x 20 reps       Knee/Hip Exercises: Seated   Other Seated Knee/Hip Exercises  Seated L leg press (2 black bands) x 5" flexion stretch x 15 reps       Manual Therapy   Manual Therapy  Joint mobilization    Manual therapy comments  supine     Joint Mobilization  L patellar mobs all directions for improved flexion/extension ROM                PT Short Term Goals - 02/25/19  1102      PT SHORT TERM GOAL #1   Title  Patient to be independent with initial HEP.    Time  3    Period  Weeks    Status  Achieved        PT Long Term Goals - 03/04/19 1118      PT LONG TERM GOAL #1   Title  Patient to be independent with advanced HEP.    Time  3    Period  Weeks    Status  Partially Met   met for current   Target Date  03/25/19      PT LONG TERM GOAL #2   Title  Patient to demonstrate B knee AROM/PROM 0-110 degrees.    Time  3    Period  Weeks    Status  Partially Met   03/04/19: L knee AROM 4-85 degrees, PROM 3-87 degrees; R AROM 5-86 degrees, PROM 5-93 degrees   Target Date  03/25/19      PT LONG TERM GOAL #3   Title  Patient to demonstrate B LE strength >=4+/5.    Time  6    Period  Weeks    Status  Achieved      PT LONG TERM GOAL #4   Title  Patient to score <14 sec on TUG with LRAD to decrease risk of falls.    Time  6    Period  Weeks    Status  Achieved   Met on 02/25/19: 10.4 sec with SPC, 10.2 without AD     PT LONG TERM GOAL #5   Title  Patient to demonstrate symmetrical weight shift, step length, and knee flexion throughout ambulation with LRAD.     Time  3    Period  Weeks    Status  Partially Met   03/04/19: patient ambulating with improvement in step length, weight shift, and gait speed, however still requires cues to increase L knee flexion at toe-off  Target Date  03/25/19            Plan - 03/04/19 1112    Clinical Impression Statement  Pt. has made good progress with therapy.  Has either met or partially met all LTG's.  Still most limited with L knee ROM partially achieving goal AROM 4-85 dg, PROM 3-87 dg.  Still demonstrating some limitation with gait mechanics requiring cueing for increased hip/knee flexion with L toe-off for improved LE clearance with SPC partially achieving LTG #5.  All other LTG's achieved at this time.  Pt. will continue to benefit from further skilled therapy for improved functional mobility, and  functional ROM.  Will continue to progress toward remaining LTG's.      Rehab Potential  Good    Clinical Impairments Affecting Rehab Potential  stroke 2004, polycystic kidney disease, OS L knee, lung nodule, hypothyroid, HTN, HLD, hiatal hernia, GERD, diverticulosis, asthma, B TKA, carpal tunnel release    PT Treatment/Interventions  ADLs/Self Care Home Management;Cryotherapy;Electrical Stimulation;Functional mobility training;Stair training;Gait training;DME Instruction;Ultrasound;Moist Heat;Therapeutic activities;Therapeutic exercise;Balance training;Neuromuscular re-education;Patient/family education;Passive range of motion;Scar mobilization;Manual techniques;Dry needling;Energy conservation;Splinting;Taping;Vasopneumatic Device    PT Next Visit Plan  progress L knee ROM    Consulted and Agree with Plan of Care  Patient       Patient will benefit from skilled therapeutic intervention in order to improve the following deficits and impairments:  Hypomobility, Increased edema, Decreased scar mobility, Decreased activity tolerance, Decreased strength, Pain, Difficulty walking, Decreased balance, Decreased range of motion, Improper body mechanics, Postural dysfunction, Impaired flexibility  Visit Diagnosis: Acute pain of left knee  Stiffness of left knee, not elsewhere classified  Stiffness of right knee, not elsewhere classified  Difficulty in walking, not elsewhere classified  Other symptoms and signs involving the musculoskeletal system     Problem List Patient Active Problem List   Diagnosis Date Noted  . Primary osteoarthritis of left knee 11/19/2018  . OSA (obstructive sleep apnea) 06/02/2018  . 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 Ingrown toenail 05/08/2018  . Onychomycosis 05/08/2018  . Stroke (cerebrum) ([place]) 01/03/2018  . Acute metabolic encephalopathy 70/62/3762  . Tobacco abuse 01/03/2018  . Acute respiratory failure ([place]) 01/19/2017  . Community acquired pneumonia  01/18/2017  . Influenza with pneumonia 01/18/2017  . Indeterminate pulmonary nodules 07/23/2015  . Obese 05/20/2015  . Undiagnosed cardiac murmurs 04/15/2015  . Asthma with acute exacerbation 03/13/2014  . Hx of adenomatous colonic polyps 05/08/2013  . Barrett's esophagus 05/03/2013  . Chronic diarrhea 04/02/2013  . HTN (hypertension) 04/02/2013  . Hx of low back pain 09/26/2012  . Arthralgia of right knee 05/02/2012  . Asthma 12/13/2011  . Benign prostatic hyperplasia 12/13/2011  . Hyperlipidemia 12/13/2011  . GERD (gastroesophageal reflux disease) 12/13/2011  . Hypothyroid   . Polycystic kidney disease     Bess Harvest, [place] 03/04/19 12:49 PM   [facility] [address]  [place], [place], [address] Phone: [phone]   Fax:  [phone]  Name: Kyle Blair MRN: [id number] Date of Birth: 15-Dec-1961  Patient has met strength and TUG goals at this time. Still limited in L knee ROM, resulting in persisting gait deviations with use of SPC. D/t these limitations, patient has resulting difficulty with ADLs. Patient would continue to benefit from skilled PT services 2x/week for 3 weeks to address these impairments.   Janene Harvey, PT, DPT 03/04/19 12:52 PM

## 2019-03-04 NOTE — Addendum Note (Signed)
Addended by: Janene Harvey D on: 03/04/2019 12:54 PM   Modules accepted: Orders

## 2019-03-07 ENCOUNTER — Ambulatory Visit: Payer: Medicare HMO | Admitting: Physical Therapy

## 2019-03-07 ENCOUNTER — Encounter: Payer: Self-pay | Admitting: Physical Therapy

## 2019-03-07 ENCOUNTER — Other Ambulatory Visit: Payer: Self-pay

## 2019-03-07 DIAGNOSIS — M25661 Stiffness of right knee, not elsewhere classified: Secondary | ICD-10-CM | POA: Diagnosis not present

## 2019-03-07 DIAGNOSIS — M25662 Stiffness of left knee, not elsewhere classified: Secondary | ICD-10-CM

## 2019-03-07 DIAGNOSIS — R262 Difficulty in walking, not elsewhere classified: Secondary | ICD-10-CM | POA: Diagnosis not present

## 2019-03-07 DIAGNOSIS — M25562 Pain in left knee: Secondary | ICD-10-CM

## 2019-03-07 DIAGNOSIS — R29898 Other symptoms and signs involving the musculoskeletal system: Secondary | ICD-10-CM | POA: Diagnosis not present

## 2019-03-07 NOTE — Therapy (Addendum)
[facility] [address]  [place], [place], [address] Phone: [phone]   Fax:  [phone]  Physical Therapy Treatment  Patient Details  Name: Kyle Blair MRN: [id number] Date of Birth: 1961/11/13 Referring Provider (PT): Dorna Leitz, MD   Encounter Date: 03/07/2019  PT End of Session - 03/07/19 1344    Visit Number  16    Number of Visits  21    Date for PT Re-Evaluation  03/25/19    Authorization Type  [employer] Medicare    PT Start Time  1304    PT Stop Time  1342    PT Time Calculation (min)  38 min    Activity Tolerance  Patient tolerated treatment well;Patient limited by pain    Behavior During Therapy  [facility] for tasks assessed/performed       Past Medical History:  Diagnosis Date  . Arthritis    severe; pt takes strong pain meds daily/legs  . Asthma    MONTH AGO  . BPH (benign prostatic hypertrophy)   . Diverticulosis   . GERD with stricture   . Hiatal hernia   . History of stroke 2004  . Hyperlipidemia   . Hypertension   . Hypothyroid   . Lung nodule 02/03/2012  . Osteoarthritis of left knee   . Polycystic kidney disease   . Stroke ([place])    2004   EQULIBRIUM, AND JUDGING DISTANCE  . Tubular adenoma of colon     Past Surgical History:  Procedure Laterality Date  . CARPAL TUNNEL RELEASE    . COLONOSCOPY    . HERNIA REPAIR  2005   with mesh  . KNEE ARTHROSCOPY Bilateral 12/05/13   cyst and bone spur removed from left knee per pt.  Marland Kitchen KNEE ARTHROSCOPY Left 01/2015  . NASAL POLYP EXCISION  1992 or 93  . POLYPECTOMY    . TOTAL KNEE ARTHROPLASTY Right 05/19/2015   Procedure: RIGHT TOTAL KNEE ARTHROPLASTY;  Surgeon: Paralee Cancel, MD;  Location: [facility];  Service: Orthopedics;  Laterality: Right;  . TOTAL KNEE ARTHROPLASTY Left 11/19/2018   Procedure: LEFT  TOTAL KNEE ARTHROPLASTY;  Surgeon: Dorna Leitz, MD;  Location: [facility];  Service: Orthopedics;  Laterality: Left;    There were no vitals  filed for this visit.  Subjective Assessment - 03/07/19 1306    Subjective  Reports that he is now able to get in and out of his car without adjusting his seat now.     Pertinent History  stroke 2004, polycystic kidney disease, OS L knee, lung nodule, hypothyroid, HTN, HLD, hiatal hernia, GERD, diverticulosis, asthma, B TKA, carpal tunnel release    Patient Stated Goals  bend my knee    Currently in Pain?  Yes    Pain Score  5     Pain Location  Knee    Pain Orientation  Left    Pain Descriptors / Indicators  --   stiffness   Pain Type  Acute pain;Surgical pain         [facility] PT Assessment - 03/07/19 0001      PROM   Left Knee Flexion  89   in knee flexion stretch on step                  [facility] Adult PT Treatment/Exercise - 03/07/19 0001      Knee/Hip Exercises: Stretches   Passive Hamstring Stretch  Left;30 seconds;3 reps    Passive Hamstring Stretch Limitations  supine with strap    Hip Flexor Stretch  Left;3 reps;30 seconds    Hip Flexor Stretch Limitations  mod thomas with strap    Other Knee/Hip Stretches  L knee flexion stretch with foot on 8" step at counter top 10x5"      Knee/Hip Exercises: Aerobic   Recumbent Bike  L1 x 6 min partial revolutions      Manual Therapy   Manual Therapy  Joint mobilization    Manual therapy comments  supine     Joint Mobilization  L patellar mobs all directions for improved flexion/extension ROM - mildly hypomobile in superior and inferior directions; L knee posterior mobilizations on tibia for increased flexion grade III/IV to tolerance; L knee extension mobilizatioen grade III to tolerance with ankle on 1/2 bolster; L knee flexion mobilizations with seat belt grade III/IV to tolerance    Passive ROM  L knee flexion stretch in between mobilizations 5x20"              PT Education - 03/07/19 1344    Education Details  advised patient to improve consistency of stretching at home for max ROM improvement    Person(s)  Educated  Patient    Methods  Explanation;Demonstration;Tactile cues;Verbal cues    Comprehension  Verbalized understanding;Returned demonstration       PT Short Term Goals - 02/25/19 1102      PT SHORT TERM GOAL #1   Title  Patient to be independent with initial HEP.    Time  3    Period  Weeks    Status  Achieved        PT Long Term Goals - 03/04/19 1118      PT LONG TERM GOAL #1   Title  Patient to be independent with advanced HEP.    Time  3    Period  Weeks    Status  Partially Met   met for current   Target Date  03/25/19      PT LONG TERM GOAL #2   Title  Patient to demonstrate B knee AROM/PROM 0-110 degrees.    Time  3    Period  Weeks    Status  Partially Met   03/04/19: L knee AROM 4-85 degrees, PROM 3-87 degrees; R AROM 5-86 degrees, PROM 5-93 degrees   Target Date  03/25/19      PT LONG TERM GOAL #3   Title  Patient to demonstrate B LE strength >=4+/5.    Time  6    Period  Weeks    Status  Achieved      PT LONG TERM GOAL #4   Title  Patient to score <14 sec on TUG with LRAD to decrease risk of falls.    Time  6    Period  Weeks    Status  Achieved   Met on 02/25/19: 10.4 sec with SPC, 10.2 without AD     PT LONG TERM GOAL #5   Title  Patient to demonstrate symmetrical weight shift, step length, and knee flexion throughout ambulation with LRAD.     Time  3    Period  Weeks    Status  Partially Met   03/04/19: patient ambulating with improvement in step length, weight shift, and gait speed, however still requires cues to increase L knee flexion at toe-off   Target Date  03/25/19            Plan - 03/07/19 1345    Clinical  Impression Statement  Patient arrived to session with report that he is now able to get in and out of the car without adjusting his seat. Focused session on manual therapy to improve L knee extension and flexion. Patient with limited tolerance and with muscle guarding with knee extension mobilizations- better ROM and tolerance  after HS stretching as patient still with considerable soft tissue tightness in posterior knee. Able to reach 89 degrees of L knee flexion during standing knee flexions stretch on step. Patient reporting limited compliance with stretching HEP- advised patient to improve consistency with stretches to continue improving ROM. Plan to consolidate HEP for improved compliance next session. Patient reported understanding and with no complaints at session. Noted that he would ice his knee at home.     Clinical Impairments Affecting Rehab Potential  stroke 2004, polycystic kidney disease, OS L knee, lung nodule, hypothyroid, HTN, HLD, hiatal hernia, GERD, diverticulosis, asthma, B TKA, carpal tunnel release    PT Treatment/Interventions  ADLs/Self Care Home Management;Cryotherapy;Electrical Stimulation;Functional mobility training;Stair training;Gait training;DME Instruction;Ultrasound;Moist Heat;Therapeutic activities;Therapeutic exercise;Balance training;Neuromuscular re-education;Patient/family education;Passive range of motion;Scar mobilization;Manual techniques;Dry needling;Energy conservation;Splinting;Taping;Vasopneumatic Device    PT Next Visit Plan  progress L knee ROM and gait training     Consulted and Agree with Plan of Care  Patient       Patient will benefit from skilled therapeutic intervention in order to improve the following deficits and impairments:  Hypomobility, Increased edema, Decreased scar mobility, Decreased activity tolerance, Decreased strength, Pain, Difficulty walking, Decreased balance, Decreased range of motion, Improper body mechanics, Postural dysfunction, Impaired flexibility  Visit Diagnosis: Acute pain of left knee  Stiffness of left knee, not elsewhere classified  Stiffness of right knee, not elsewhere classified  Difficulty in walking, not elsewhere classified  Other symptoms and signs involving the musculoskeletal system     Problem List Patient Active Problem  List   Diagnosis Date Noted  . Primary osteoarthritis of left knee 11/19/2018  . OSA (obstructive sleep apnea) 06/02/2018  . 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 Ingrown toenail 05/08/2018  . Onychomycosis 05/08/2018  . Stroke (cerebrum) ([place]) 01/03/2018  . Acute metabolic encephalopathy 40/81/4481  . Tobacco abuse 01/03/2018  . Acute respiratory failure ([place]) 01/19/2017  . Community acquired pneumonia 01/18/2017  . Influenza with pneumonia 01/18/2017  . Indeterminate pulmonary nodules 07/23/2015  . Obese 05/20/2015  . Undiagnosed cardiac murmurs 04/15/2015  . Asthma with acute exacerbation 03/13/2014  . Hx of adenomatous colonic polyps 05/08/2013  . Barrett's esophagus 05/03/2013  . Chronic diarrhea 04/02/2013  . HTN (hypertension) 04/02/2013  . Hx of low back pain 09/26/2012  . Arthralgia of right knee 05/02/2012  . Asthma 12/13/2011  . Benign prostatic hyperplasia 12/13/2011  . Hyperlipidemia 12/13/2011  . GERD (gastroesophageal reflux disease) 12/13/2011  . Hypothyroid   . Polycystic kidney disease      Janene Harvey, PT, DPT 03/07/19 1:51 PM   [facility] [address]  [place], [place], [address] Phone: [phone]   Fax:  [phone]  Name: NAZEER ROMNEY MRN: [id number] Date of Birth: 1961-07-04

## 2019-03-11 ENCOUNTER — Encounter: Payer: Self-pay | Admitting: Physical Therapy

## 2019-03-11 ENCOUNTER — Other Ambulatory Visit: Payer: Self-pay

## 2019-03-11 ENCOUNTER — Ambulatory Visit: Payer: Medicare HMO | Admitting: Physical Therapy

## 2019-03-11 DIAGNOSIS — M25562 Pain in left knee: Secondary | ICD-10-CM

## 2019-03-11 DIAGNOSIS — R29898 Other symptoms and signs involving the musculoskeletal system: Secondary | ICD-10-CM

## 2019-03-11 DIAGNOSIS — R262 Difficulty in walking, not elsewhere classified: Secondary | ICD-10-CM | POA: Diagnosis not present

## 2019-03-11 DIAGNOSIS — M25662 Stiffness of left knee, not elsewhere classified: Secondary | ICD-10-CM | POA: Diagnosis not present

## 2019-03-11 DIAGNOSIS — M25661 Stiffness of right knee, not elsewhere classified: Secondary | ICD-10-CM | POA: Diagnosis not present

## 2019-03-11 NOTE — Therapy (Signed)
[facility] [address]  [place], [place], [address] Phone: [phone]   Fax:  [phone]  Physical Therapy Treatment  Patient Details  Name: Kyle Blair MRN: [id number] Date of Birth: June 04, 1961 Referring Provider (PT): Dorna Leitz, MD   Encounter Date: 03/11/2019  PT End of Session - 03/11/19 1137    Visit Number  17    Number of Visits  21    Date for PT Re-Evaluation  03/25/19    Authorization Type  [employer] Medicare    PT Start Time  1100    PT Stop Time  1147    PT Time Calculation (min)  47 min    Activity Tolerance  Patient tolerated treatment well;Patient limited by pain    Behavior During Therapy  [facility] for tasks assessed/performed       Past Medical History:  Diagnosis Date  . Arthritis    severe; pt takes strong pain meds daily/legs  . Asthma    MONTH AGO  . BPH (benign prostatic hypertrophy)   . Diverticulosis   . GERD with stricture   . Hiatal hernia   . History of stroke 2004  . Hyperlipidemia   . Hypertension   . Hypothyroid   . Lung nodule 02/03/2012  . Osteoarthritis of left knee   . Polycystic kidney disease   . Stroke ([place])    2004   EQULIBRIUM, AND JUDGING DISTANCE  . Tubular adenoma of colon     Past Surgical History:  Procedure Laterality Date  . CARPAL TUNNEL RELEASE    . COLONOSCOPY    . HERNIA REPAIR  2005   with mesh  . KNEE ARTHROSCOPY Bilateral 12/05/13   cyst and bone spur removed from left knee per pt.  Marland Kitchen KNEE ARTHROSCOPY Left 01/2015  . NASAL POLYP EXCISION  1992 or 93  . POLYPECTOMY    . TOTAL KNEE ARTHROPLASTY Right 05/19/2015   Procedure: RIGHT TOTAL KNEE ARTHROPLASTY;  Surgeon: Paralee Cancel, MD;  Location: [facility];  Service: Orthopedics;  Laterality: Right;  . TOTAL KNEE ARTHROPLASTY Left 11/19/2018   Procedure: LEFT  TOTAL KNEE ARTHROPLASTY;  Surgeon: Dorna Leitz, MD;  Location: [place];  Service: Orthopedics;  Laterality: Left;    There were no vitals  filed for this visit.  Subjective Assessment - 03/11/19 1104    Subjective  Patient bringing in HEP folder for review today. Reporting "lightening" nerve pain through his L leg over the weekend- believes he over-did himself by going to several grocery stores over the weekend.     Pertinent History  stroke 2004, polycystic kidney disease, OS L knee, lung nodule, hypothyroid, HTN, HLD, hiatal hernia, GERD, diverticulosis, asthma, B TKA, carpal tunnel release    Patient Stated Goals  bend my knee    Currently in Pain?  Yes    Pain Score  4     Pain Location  Knee    Pain Orientation  Left    Pain Descriptors / Indicators  --   stiffness   Pain Type  Acute pain;Surgical pain                       [facility] Adult PT Treatment/Exercise - 03/11/19 0001      Ambulation/Gait   Ambulation/Gait  Yes    Ambulation/Gait Assistance  5: Supervision;4: Min guard    Ambulation Distance (Feet)  180 Feet    Assistive device  Straight cane  Gait Pattern  Step-through pattern;Step-to pattern;Decreased stance time - left;Decreased hip/knee flexion - left;Decreased weight shift to left;Trunk flexed;Poor foot clearance - left;Poor foot clearance - right    Gait Comments  gait training with SPC and cues to increase L knee flexion at toe off as well as heel strike at initial contact- patient requiring cueing for speed; pre-gait training at counter top with L forward stepping with cues for L knee flexion at toe off      Knee/Hip Exercises: Stretches   Consulting civil engineer Limitations  prone with strap     Hip Flexor Stretch  Left;30 seconds;3 reps    Hip Flexor Stretch Limitations  mod thomas with strap    Other Knee/Hip Stretches  L knee flexion stretch with foot on 8" step at counter top 10x5"      Knee/Hip Exercises: Aerobic   Nustep  L2 x 7 min UEs/LEs      Knee/Hip Exercises: Machines for Strengthening   Cybex Leg Press  B LE's: 35# 5" x 15 reps; L LE 20# x  10      Manual Therapy   Manual Therapy  Muscle Energy Technique;Passive ROM    Passive ROM  L knee flexion stretch in prone to tolerance in between MET 5x20"    Muscle Energy Technique  5 cycles of 5 sec MET to L quad for increased L knee flexion             PT Education - 03/11/19 1136    Education Details  consolidation and review of HEP- advised patient to improve consistency    Person(s) Educated  Patient    Methods  Explanation;Demonstration;Tactile cues;Verbal cues;Handout    Comprehension  Verbalized understanding;Returned demonstration       PT Short Term Goals - 02/25/19 1102      PT SHORT TERM GOAL #1   Title  Patient to be independent with initial HEP.    Time  3    Period  Weeks    Status  Achieved        PT Long Term Goals - 03/04/19 1118      PT LONG TERM GOAL #1   Title  Patient to be independent with advanced HEP.    Time  3    Period  Weeks    Status  Partially Met   met for current   Target Date  03/25/19      PT LONG TERM GOAL #2   Title  Patient to demonstrate B knee AROM/PROM 0-110 degrees.    Time  3    Period  Weeks    Status  Partially Met   03/04/19: L knee AROM 4-85 degrees, PROM 3-87 degrees; R AROM 5-86 degrees, PROM 5-93 degrees   Target Date  03/25/19      PT LONG TERM GOAL #3   Title  Patient to demonstrate B LE strength >=4+/5.    Time  6    Period  Weeks    Status  Achieved      PT LONG TERM GOAL #4   Title  Patient to score <14 sec on TUG with LRAD to decrease risk of falls.    Time  6    Period  Weeks    Status  Achieved   Met on 02/25/19: 10.4 sec with SPC, 10.2 without AD     PT LONG TERM GOAL #5   Title  Patient to demonstrate symmetrical  weight shift, step length, and knee flexion throughout ambulation with LRAD.     Time  3    Period  Weeks    Status  Partially Met   03/04/19: patient ambulating with improvement in step length, weight shift, and gait speed, however still requires cues to increase L knee flexion  at toe-off   Target Date  03/25/19            Plan - 03/11/19 1147    Clinical Impression Statement  Patient arrived to session, ambulating with SPC and still demonstrating decreased L knee flexion throughout swing phase of gait. Worked on gait training with focus on increased L knee flexion at toe-off and improvement in TKE and heel strike at initial contact. Patient demonstrating some difficulty, thus worked on anterior stepping with break-down of this activity. Improved performance when increasing walking speed d/t imbalance and confusion. Provided consolidation and review of HEP and heavily emphasized importance of increased consistency of HEP to improve L knee flexion. Patient reported understanding. Introduced leg press with L LE only at 20 lbs with patient tolerating this well. No complaints at end of session.     Clinical Impairments Affecting Rehab Potential  stroke 2004, polycystic kidney disease, OS L knee, lung nodule, hypothyroid, HTN, HLD, hiatal hernia, GERD, diverticulosis, asthma, B TKA, carpal tunnel release    PT Treatment/Interventions  ADLs/Self Care Home Management;Cryotherapy;Electrical Stimulation;Functional mobility training;Stair training;Gait training;DME Instruction;Ultrasound;Moist Heat;Therapeutic activities;Therapeutic exercise;Balance training;Neuromuscular re-education;Patient/family education;Passive range of motion;Scar mobilization;Manual techniques;Dry needling;Energy conservation;Splinting;Taping;Vasopneumatic Device    PT Next Visit Plan  progress L knee ROM and gait training     Consulted and Agree with Plan of Care  Patient       Patient will benefit from skilled therapeutic intervention in order to improve the following deficits and impairments:  Hypomobility, Increased edema, Decreased scar mobility, Decreased activity tolerance, Decreased strength, Pain, Difficulty walking, Decreased balance, Decreased range of motion, Improper body mechanics, Postural  dysfunction, Impaired flexibility  Visit Diagnosis: Acute pain of left knee  Stiffness of left knee, not elsewhere classified  Stiffness of right knee, not elsewhere classified  Difficulty in walking, not elsewhere classified  Other symptoms and signs involving the musculoskeletal system     Problem List Patient Active Problem List   Diagnosis Date Noted  . Primary osteoarthritis of left knee 11/19/2018  . OSA (obstructive sleep apnea) 06/02/2018  . 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 Ingrown toenail 05/08/2018  . Onychomycosis 05/08/2018  . Stroke (cerebrum) ([place]) 01/03/2018  . Acute metabolic encephalopathy 28/41/3244  . Tobacco abuse 01/03/2018  . Acute respiratory failure ([place]) 01/19/2017  . Community acquired pneumonia 01/18/2017  . Influenza with pneumonia 01/18/2017  . Indeterminate pulmonary nodules 07/23/2015  . Obese 05/20/2015  . Undiagnosed cardiac murmurs 04/15/2015  . Asthma with acute exacerbation 03/13/2014  . Hx of adenomatous colonic polyps 05/08/2013  . Barrett's esophagus 05/03/2013  . Chronic diarrhea 04/02/2013  . HTN (hypertension) 04/02/2013  . Hx of low back pain 09/26/2012  . Arthralgia of right knee 05/02/2012  . Asthma 12/13/2011  . Benign prostatic hyperplasia 12/13/2011  . Hyperlipidemia 12/13/2011  . GERD (gastroesophageal reflux disease) 12/13/2011  . Hypothyroid   . Polycystic kidney disease      Janene Harvey, PT, DPT 03/11/19 11:54 AM   [facility] [address]  [place], [place], [address] Phone: [phone]   Fax:  [phone]  Name: Kyle Blair MRN: [id number] Date of Birth: 1961-06-15

## 2019-03-13 ENCOUNTER — Encounter: Payer: Self-pay | Admitting: Family Medicine

## 2019-03-13 ENCOUNTER — Ambulatory Visit (INDEPENDENT_AMBULATORY_CARE_PROVIDER_SITE_OTHER): Payer: Medicare HMO | Admitting: Family Medicine

## 2019-03-13 ENCOUNTER — Other Ambulatory Visit: Payer: Self-pay

## 2019-03-13 VITALS — BP 122/78 | HR 87 | Temp 98.2°F | Ht 66.0 in | Wt 228.0 lb

## 2019-03-13 DIAGNOSIS — J4541 Moderate persistent asthma with (acute) exacerbation: Secondary | ICD-10-CM

## 2019-03-13 MED ORDER — FLUTICASONE PROPIONATE HFA 110 MCG/ACT IN AERO: INHALATION_SPRAY | RESPIRATORY_TRACT | 12 refills | Status: DC

## 2019-03-13 MED ORDER — PREDNISONE 20 MG PO TABS: 40.0000 mg | ORAL_TABLET | Freq: Every day | ORAL | 0 refills | Status: AC

## 2019-03-13 MED ORDER — METHYLPREDNISOLONE ACETATE 80 MG/ML IJ SUSP
80.0000 mg | Freq: Once | INTRAMUSCULAR | Status: AC
  Administered 2019-03-13: 80 mg via INTRAMUSCULAR

## 2019-03-13 MED FILL — FLOVENT HFA 110 MCG INHALER: 110 | 30 days supply | Qty: 12 | Fill #0

## 2019-03-13 MED FILL — predniSONE 20 MG TABS: 20 | 5 days supply | Qty: 10 | Fill #0

## 2019-03-13 NOTE — Progress Notes (Signed)
Chief Complaint  Patient presents with  . Wheezing    Subjective: Patient is a 58 y.o. male here for asthma flare.  Wheezing, coughing and sob over past 3 d, getting worse. No sickness, environment seems to trigger s/s's. Denies fevers. Neb tx's unhelpful.    ROS: Heart: Denies chest pain  Lungs: +SOB   Past Medical History:  Diagnosis Date  . Arthritis    severe; pt takes strong pain meds daily/legs  . Asthma    MONTH AGO  . BPH (benign prostatic hypertrophy)   . Diverticulosis   . GERD with stricture   . Hiatal hernia   . History of stroke 2004  . Hyperlipidemia   . Hypertension   . Hypothyroid   . Lung nodule 02/03/2012  . Osteoarthritis of left knee   . Polycystic kidney disease   . Stroke ([place])    2004   EQULIBRIUM, AND JUDGING DISTANCE  . Tubular adenoma of colon     Objective: BP 122/78 (BP Location: Left Arm, Patient Position: Sitting, Cuff Size: Normal)   Pulse 87   Temp 98.2 F (36.8 C) (Oral)   Ht 5\' 6"  (1.676 m)   Wt 228 lb (103.4 kg)   SpO2 95%   BMI 36.80 kg/m  General: Awake, appears stated age HEENT: MMM, EOMi Heart: RRR Lungs: +diffuse wheezing. No accessory muscle use Psych: Age appropriate judgment and insight, normal affect and mood  Assessment and Plan: Moderate persistent asthma with acute exacerbation - Plan: fluticasone (FLOVENT HFA) 110 MCG/ACT inhaler, predniSONE (DELTASONE) 20 MG tablet  Orders as above. IM Depo, start pred tomorrow.  ICS for yellow zone. Rinse mouth out after use. Cont Advair. F/u prn.  The patient voiced understanding and agreement to the plan.  [place], DO 03/13/19  2:41 PM

## 2019-03-13 NOTE — Patient Instructions (Addendum)
Take the prednisone starting tomorrow.  Asthma Action Plan There are 3 zones to consider: 1. Green Zone- This is the zone we hope to keep you in. Avoiding allergens and compliance with inhalers will keep you in this safe zone. No need to take anything extra while in this zone. 2. Yellow Zone- This zone is where we can save time, money and visits. You are not doing well enough to be considered in the green zone, but not bad enough to be in the red zone. This is where we need to remove any allergen or factor that is contributing to your breathing issues. You have a separate inhaler (inhaled steroid) to take twice daily in addition to your other inhalers. This should give you the push you need to get back to the green zone. Contact our office if you have any questions. 3. Red Zone- This is the zone where you should consider calling 911 vs going to the ER. Despite compliance with inhalers/meds (or maybe because we haven't been using them), our breathing isn't where we want it to be. Albuterol isn't helping as much either and you need medical care. This is the zone we try to avoid as much as possible!  Let us know if you need anything.

## 2019-03-13 NOTE — Addendum Note (Signed)
Addended by: Sharon Seller B on: 03/13/2019 02:52 PM   Modules accepted: Orders

## 2019-03-14 ENCOUNTER — Other Ambulatory Visit: Payer: Self-pay

## 2019-03-14 ENCOUNTER — Ambulatory Visit: Payer: Medicare HMO

## 2019-03-14 DIAGNOSIS — M25662 Stiffness of left knee, not elsewhere classified: Secondary | ICD-10-CM | POA: Diagnosis not present

## 2019-03-14 DIAGNOSIS — M25661 Stiffness of right knee, not elsewhere classified: Secondary | ICD-10-CM

## 2019-03-14 DIAGNOSIS — R29898 Other symptoms and signs involving the musculoskeletal system: Secondary | ICD-10-CM

## 2019-03-14 DIAGNOSIS — R262 Difficulty in walking, not elsewhere classified: Secondary | ICD-10-CM

## 2019-03-14 DIAGNOSIS — M25562 Pain in left knee: Secondary | ICD-10-CM

## 2019-03-14 NOTE — Therapy (Addendum)
[facility] [address]  [place], [place], [address] Phone: [phone]   Fax:  [phone]  Physical Therapy Treatment  Patient Details  Name: Kyle Blair MRN: [id number] Date of Birth: Oct 21, 1961 Referring Provider (PT): Dorna Leitz, MD   Progress Note Reporting Period 01/22/19 to 03/14/19  See note below for Objective Data and Assessment of Progress/Goals.     Encounter Date: 03/14/2019  PT End of Session - 03/14/19 1327    Visit Number  18    Number of Visits  21    Date for PT Re-Evaluation  03/25/19    Authorization Type  Aetna Medicare    PT Start Time  1315    PT Stop Time  1400    PT Time Calculation (min)  45 min    Activity Tolerance  Patient tolerated treatment well;Patient limited by pain    Behavior During Therapy  [facility] for tasks assessed/performed       Past Medical History:  Diagnosis Date  . Arthritis    severe; pt takes strong pain meds daily/legs  . Asthma    MONTH AGO  . BPH (benign prostatic hypertrophy)   . Diverticulosis   . GERD with stricture   . Hiatal hernia   . History of stroke 2004  . Hyperlipidemia   . Hypertension   . Hypothyroid   . Lung nodule 02/03/2012  . Osteoarthritis of left knee   . Polycystic kidney disease   . Stroke ([place])    2004   EQULIBRIUM, AND JUDGING DISTANCE  . Tubular adenoma of colon     Past Surgical History:  Procedure Laterality Date  . CARPAL TUNNEL RELEASE    . COLONOSCOPY    . HERNIA REPAIR  2005   with mesh  . KNEE ARTHROSCOPY Bilateral 12/05/13   cyst and bone spur removed from left knee per pt.  Marland Kitchen KNEE ARTHROSCOPY Left 01/2015  . NASAL POLYP EXCISION  1992 or 93  . POLYPECTOMY    . TOTAL KNEE ARTHROPLASTY Right 05/19/2015   Procedure: RIGHT TOTAL KNEE ARTHROPLASTY;  Surgeon: Paralee Cancel, MD;  [facility];  Service: Orthopedics;  Laterality: Right;  . TOTAL KNEE ARTHROPLASTY Left 11/19/2018   Procedure: LEFT  TOTAL  KNEE ARTHROPLASTY;  Surgeon: Dorna Leitz, MD;  Location: [place];  Service: Orthopedics;  Laterality: Left;    There were no vitals filed for this visit.  Subjective Assessment - 03/14/19 1322    Subjective  pt. reporting he wishes to stop therapy due to concerns over being exposed to virus.      Pertinent History  stroke 2004, polycystic kidney disease, OS L knee, lung nodule, hypothyroid, HTN, HLD, hiatal hernia, GERD, diverticulosis, asthma, B TKA, carpal tunnel release    How long can you sit comfortably?  unlimited    How long can you stand comfortably?  10 min     How long can you walk comfortably?  1 hour     Patient Stated Goals  bend my knee    Currently in Pain?  Yes    Pain Score  1     Pain Location  Knee    Pain Orientation  Left    Pain Descriptors / Indicators  --   "stiffness"   Pain Type  Acute pain;Surgical pain    Multiple Pain Sites  No         [facility] PT Assessment - 03/14/19 0001  Observation/Other Assessments   Focus on Therapeutic Outcomes (FOTO)   61% (39% limitation)      AROM   Right/Left Knee  Right;Left    Right Knee Extension  5    Right Knee Flexion  86    Left Knee Extension  4    Left Knee Flexion  85      PROM   Right/Left Knee  Left;Right    Right Knee Extension  5    Right Knee Flexion  93    Left Knee Extension  3    Left Knee Flexion  91                   [facility] Adult PT Treatment/Exercise - 03/14/19 0001      Self-Care   Self-Care  Other Self-Care Comments    Other Self-Care Comments   comprehensive review and update of comprehensive HEP with update and handout issued to pt.; pt. verbalized understanding       Knee/Hip Exercises: Aerobic   Recumbent Bike  L1 x 6 min partial revolutions      Knee/Hip Exercises: Supine   Bridges  Both;10 reps             PT Education - 03/14/19 1408    Education Details  HEP update    Person(s) Educated  Patient    Methods  Explanation;Demonstration;Verbal cues;Handout     Comprehension  Verbalized understanding;Returned demonstration;Verbal cues required;Need further instruction       PT Short Term Goals - 02/25/19 1102      PT SHORT TERM GOAL #1   Title  Patient to be independent with initial HEP.    Time  3    Period  Weeks    Status  Achieved        PT Long Term Goals - 03/14/19 1328      PT LONG TERM GOAL #1   Title  Patient to be independent with advanced HEP.    Time  3    Period  Weeks    Status  Partially Met   met for current     PT LONG TERM GOAL #2   Title  Patient to demonstrate B knee AROM/PROM 0-110 degrees.    Time  3    Period  Weeks    Status  Partially Met   03/14/19: L knee AROM 4-85 degrees, PROM 3-91 degrees; R AROM 5-86 degrees, PROM 5-93 degrees     PT LONG TERM GOAL #3   Title  Patient to demonstrate B LE strength >=4+/5.    Time  6    Period  Weeks    Status  Achieved      PT LONG TERM GOAL #4   Title  Patient to score <14 sec on TUG with LRAD to decrease risk of falls.    Time  6    Period  Weeks    Status  Achieved   Met on 02/25/19: 10.4 sec with SPC, 10.2 without AD     PT LONG TERM GOAL #5   Title  Patient to demonstrate symmetrical weight shift, step length, and knee flexion throughout ambulation with LRAD.     Time  3    Period  Weeks    Status  Partially Met   03/14/19: patient ambulating with improvement in step length, weight shift, and gait speed, however still requires cues to increase L knee flexion at toe-off  Plan - 03/14/19 1335    Clinical Impression Statement  Kyle Blair reporting he wishes to go on hold from therapy due to concerns over Coronavirus.  Supervising PT approving this plan.  Kyle Blair able to demo mild improvement in L knee PROM to 4-91 dg today.  Kyle Blair has currrently met or partially met all LTG's at this time with therapy.  Session focused on review and update of comprehensive HEP to consolidate program to encourage ongoing adherence.  Pt. verbalized understnading and  issued handout.  Ended session with pt. on 30-day hold.      Rehab Potential  Good    Clinical Impairments Affecting Rehab Potential  stroke 2004, polycystic kidney disease, OS L knee, lung nodule, hypothyroid, HTN, HLD, hiatal hernia, GERD, diverticulosis, asthma, B TKA, carpal tunnel release    PT Treatment/Interventions  ADLs/Self Care Home Management;Cryotherapy;Electrical Stimulation;Functional mobility training;Stair training;Gait training;DME Instruction;Ultrasound;Moist Heat;Therapeutic activities;Therapeutic exercise;Balance training;Neuromuscular re-education;Patient/family education;Passive range of motion;Scar mobilization;Manual techniques;Dry needling;Energy conservation;Splinting;Taping;Vasopneumatic Device    PT Next Visit Plan  30-day hold from therapy    Consulted and Agree with Plan of Care  Patient       Patient will benefit from skilled therapeutic intervention in order to improve the following deficits and impairments:  Hypomobility, Increased edema, Decreased scar mobility, Decreased activity tolerance, Decreased strength, Pain, Difficulty walking, Decreased balance, Decreased range of motion, Improper body mechanics, Postural dysfunction, Impaired flexibility  Visit Diagnosis: Acute pain of left knee  Stiffness of left knee, not elsewhere classified  Stiffness of right knee, not elsewhere classified  Difficulty in walking, not elsewhere classified  Other symptoms and signs involving the musculoskeletal system     Problem List Patient Active Problem List   Diagnosis Date Noted  . Primary osteoarthritis of left knee 11/19/2018  . OSA (obstructive sleep apnea) 06/02/2018  . 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 Ingrown toenail 05/08/2018  . Onychomycosis 05/08/2018  . Stroke (cerebrum) ([place]) 01/03/2018  . Acute metabolic encephalopathy 38/93/7342  . Tobacco abuse 01/03/2018  . Acute respiratory failure ([place]) 01/19/2017  . Community acquired pneumonia  01/18/2017  . Influenza with pneumonia 01/18/2017  . Indeterminate pulmonary nodules 07/23/2015  . Obese 05/20/2015  . Undiagnosed cardiac murmurs 04/15/2015  . Asthma with acute exacerbation 03/13/2014  . Hx of adenomatous colonic polyps 05/08/2013  . Barrett's esophagus 05/03/2013  . Chronic diarrhea 04/02/2013  . HTN (hypertension) 04/02/2013  . Hx of low back pain 09/26/2012  . Arthralgia of right knee 05/02/2012  . Asthma 12/13/2011  . Benign prostatic hyperplasia 12/13/2011  . Hyperlipidemia 12/13/2011  . GERD (gastroesophageal reflux disease) 12/13/2011  . Hypothyroid   . Polycystic kidney disease     Bess Harvest, [place] 03/14/19 2:44 PM   [facility] [address]  [place], [place], [address] Phone: [phone]   Fax:  [phone]  Name: Kyle Blair MRN: [id number] Date of Birth: 31-Dec-1960  PHYSICAL THERAPY DISCHARGE SUMMARY  Visits from Start of Care: 18  Current functional level related to goals / functional outcomes: See above clinical impression- patient requested to wrap up with PT d/t concerns about COVID-19   Remaining deficits: Decreased AROM, gait deviations   Education / Equipment: HEP  Plan: Patient agrees to discharge.  Patient goals were partially met. Patient is being discharged due to the patient's request.  ?????     Janene Harvey, PT, DPT 04/24/19 12:54 PM

## 2019-03-25 ENCOUNTER — Encounter: Payer: Medicare HMO | Admitting: Physical Therapy

## 2019-03-28 ENCOUNTER — Ambulatory Visit: Payer: Medicare HMO | Admitting: Physical Therapy

## 2019-03-29 DIAGNOSIS — R69 Illness, unspecified: Secondary | ICD-10-CM | POA: Diagnosis not present

## 2019-04-04 DIAGNOSIS — Z79899 Other long term (current) drug therapy: Secondary | ICD-10-CM | POA: Diagnosis not present

## 2019-04-04 DIAGNOSIS — M25569 Pain in unspecified knee: Secondary | ICD-10-CM | POA: Diagnosis not present

## 2019-04-04 DIAGNOSIS — G894 Chronic pain syndrome: Secondary | ICD-10-CM | POA: Diagnosis not present

## 2019-04-04 DIAGNOSIS — M179 Osteoarthritis of knee, unspecified: Secondary | ICD-10-CM | POA: Diagnosis not present

## 2019-04-04 DIAGNOSIS — Z79891 Long term (current) use of opiate analgesic: Secondary | ICD-10-CM | POA: Diagnosis not present

## 2019-04-04 DIAGNOSIS — M545 Low back pain: Secondary | ICD-10-CM | POA: Diagnosis not present

## 2019-04-10 ENCOUNTER — Encounter: Payer: Self-pay | Admitting: Family Medicine

## 2019-04-10 ENCOUNTER — Other Ambulatory Visit: Payer: Self-pay

## 2019-04-10 ENCOUNTER — Ambulatory Visit (INDEPENDENT_AMBULATORY_CARE_PROVIDER_SITE_OTHER): Payer: Medicare HMO | Admitting: Family Medicine

## 2019-04-10 DIAGNOSIS — J4541 Moderate persistent asthma with (acute) exacerbation: Secondary | ICD-10-CM

## 2019-04-10 MED ORDER — PREDNISONE 20 MG PO TABS: 40.0000 mg | ORAL_TABLET | Freq: Every day | ORAL | 0 refills | Status: DC

## 2019-04-10 MED FILL — predniSONE 20 MG TABS: 20 | 5 days supply | Qty: 10 | Fill #0

## 2019-04-10 NOTE — Progress Notes (Signed)
CC: Asthma  Subjective: Patient is a 58 y.o. male here for asthma flare. Due to outbreak, we are interacting via web portal for an electronic face-to-face visit. I verified patient's ID using 2 identifiers.   1 d ago started having a flare in his asthma. He started taking Flovent INSTEAD of Advair having misunderstood his instructions. He is SOB and wheezing. Pred usually opens him up. No fevers or recent URI.   ROS: Lungs: +SOB   Past Medical History:  Diagnosis Date  . Arthritis    severe; pt takes strong pain meds daily/legs  . Asthma    MONTH AGO  . BPH (benign prostatic hypertrophy)   . Diverticulosis   . GERD with stricture   . Hiatal hernia   . History of stroke 2004  . Hyperlipidemia   . Hypertension   . Hypothyroid   . Lung nodule 02/03/2012  . Osteoarthritis of left knee   . Polycystic kidney disease   . Stroke ([place])    2004   EQULIBRIUM, AND JUDGING DISTANCE  . Tubular adenoma of colon     Objective: No conversational dyspnea Age appropriate judgment and insight Nml affect and mood  Assessment and Plan: Moderate persistent asthma with acute exacerbation - Plan: predniSONE (DELTASONE) 20 MG tablet  Orders as above. Clarified what I want him to do in his yellow zone. Needs to take ICS in addition to his Advair.  F/u prn.  The patient voiced understanding and agreement to the plan.  [place], DO 04/10/19  2:44 PM

## 2019-04-10 NOTE — Patient Instructions (Signed)
When you have asthma flares, use Flovent inhaler WITH the Advair. We are looking to increase the inhaled steroid to calm down and open your lungs.  Let us know if you need anything.

## 2019-04-11 ENCOUNTER — Telehealth: Payer: Self-pay | Admitting: Internal Medicine

## 2019-04-11 ENCOUNTER — Ambulatory Visit (INDEPENDENT_AMBULATORY_CARE_PROVIDER_SITE_OTHER): Payer: Medicare HMO

## 2019-04-11 ENCOUNTER — Other Ambulatory Visit: Payer: Self-pay

## 2019-04-11 DIAGNOSIS — J4541 Moderate persistent asthma with (acute) exacerbation: Secondary | ICD-10-CM | POA: Diagnosis not present

## 2019-04-11 MED ORDER — METHYLPREDNISOLONE ACETATE 40 MG/ML IJ SUSP
40.0000 mg | Freq: Once | INTRAMUSCULAR | Status: AC
  Administered 2019-04-11: 40 mg via INTRAMUSCULAR

## 2019-04-11 NOTE — Telephone Encounter (Signed)
Administered Depo 40 mg IM right hip per request of Dr. Larose Kells. Patietn tolerated well.

## 2019-04-11 NOTE — Telephone Encounter (Signed)
Pt  was seen yesterday with asthma exacerbation and was prescribed prednisone by mouth.  Historically, a local  IM injection helps better and the patient request so. Please advise patient: -There is a risk associated to coming to the office in person due to the COVID-19 pandemia -If he feels that is what he likes to do, then Rx, Depo-Medrol 40 mg IM x1 today. -Continue with prednisone as prescribed -Also make an appointment to see PCP and adjust asthma medications to prevent frequent asthma exacerbations. JP

## 2019-04-12 NOTE — Progress Notes (Signed)
Kathlene November, MD

## 2019-04-15 ENCOUNTER — Ambulatory Visit: Payer: Self-pay | Admitting: *Deleted

## 2019-04-15 ENCOUNTER — Telehealth: Payer: Self-pay

## 2019-04-15 MED ORDER — AZITHROMYCIN 250 MG PO TABS: ORAL_TABLET | ORAL | 0 refills | Status: DC

## 2019-04-15 MED ORDER — PREDNISONE 10 MG PO TABS: ORAL_TABLET | ORAL | 0 refills | Status: DC

## 2019-04-15 NOTE — Telephone Encounter (Signed)
Attempted to reach patient at number listed no answer.

## 2019-04-15 NOTE — Telephone Encounter (Signed)
Attempted to reach patient again.  No answer. Left detailed message on machine that I sent rx for prednisone and azithromycin to his pharmacy however I am concerned about his breathing and recommend that he go to ER if symptoms worsen in any way or if they fail to improve as risk of not seeking treatment would outweigh exposure risk in that instant. Advised pt to schedule a follow up virtual visit in 3 days.   Rod Holler before you go this afternoon can you reach back out to him to make sure he received the above message?

## 2019-04-15 NOTE — Telephone Encounter (Signed)
Called  A few times but no answer, left detailed message about medication that was sent to the pharmacy and to call back for virtual visit f/up on Wednesday.

## 2019-04-15 NOTE — Telephone Encounter (Signed)
Patient advised Kyle Blair reviewed all of his recent notes and treatments and she advises to go to the ed or urgent care. Patient still refuses to go he insist "he needs antibiotics and more steroids" said "if he goes to ed or uc he will get infected and die". I explained to him per Lenna Sciara this was the best course of action at this time and if he goes to ware a mask and gloves. Him and his wife still refusing to go.

## 2019-04-15 NOTE — Addendum Note (Signed)
Addended by: [name] on: 04/15/2019 11:48 AM   Modules accepted: Orders

## 2019-04-15 NOTE — Telephone Encounter (Signed)
Patient complaining of worsening symptoms. Kyle Blair, requesting a in office visit for steroids. Depo 40mg  im injection on 04-11-19. Refuses virtual visit, refuses going to er.  Please advsie.

## 2019-04-15 NOTE — Telephone Encounter (Signed)
We are not seeing patients in the office. Due to severity of his symptoms despite oral steroids prescribed by Dr. Nani Ravens I took suggest ED.  If he does not want to go to the ED he needs to go to Urgent care.  He should wear a mask when he goes.

## 2019-04-15 NOTE — Telephone Encounter (Signed)
Pt seen 4/15 and 4/16.Marland Kitchen  'Moderate persistent asthma with acute exacerbation'  Virtual visit 4/15 with Dr. Nani Ravens, saw Dr. Larose Kells , depo injection 04/11/2019. Calling today requesting "Zpack and steroids, another injection" States "I need to be seen."  States SOB, "Can hardly talk", chest tightness, all weekend. Audible wheezing noted during call, speech halting. States did nebs treatment at [facility], states inhalers help "For just a little while." Pt refuses ED disposition, "I don't want to get sick there, I will not go to any ED." [place] called practice and spoke with Anderson Malta. Instructed to route triage note. Pt made aware PCP will be alerted of worsening symptoms. Reiterated again need for ED disposition, refuses. CB# [phone]  Reason for Disposition . [1] MODERATE asthma attack (e.g., SOB at rest, speaks in phrases, audible wheezes) AND [2] not resolved after 2 nebulizer or inhaler treatments given 20 minutes apart  Answer Assessment - Initial Assessment Questions 1. RESPIRATORY STATUS: "Describe your breathing?" (e.g., wheezing, shortness of breath, unable to speak, severe coughing)      *No Answer* 2. ONSET: "When did this asthma attack begin?"      *No Answer* 3. TRIGGER: "What do you think triggered this attack?" (e.g., URI, exposure to pollen or other allergen, tobacco smoke)      *No Answer* 4. PEAK EXPIRATORY FLOW RATE (PEFR): "Do you use a peak flow meter?" If so, ask: "What's the current peak flow? What's your personal best peak flow?"      *No Answer* 5. SEVERITY: "How bad is this attack?"    - MILD: No SOB at rest, mild SOB with walking, speaks normally in sentences, can lay down, no retractions, pulse < 100. (GREEN Zone: PEFR 80-100%)   - MODERATE: SOB at rest, SOB with minimal exertion and prefers to sit, cannot lie down flat, speaks in phrases, mild retractions, audible wheezing, pulse 100-120. (YELLOW Zone: PEFR 50-80%)    - SEVERE: Very SOB at rest, speaks in single words, struggling  to breathe, sitting hunched forward, retractions, usually loud wheezing, sometimes minimal wheezing because of decreased air movement, pulse > 120. (RED Zone: PEFR < 50%).      *No Answer* 6. MEDICATIONS (Inhaler or nebs): "What are your asthma medications?" and "What treatments have you given so far?"    - Quick-relief: albuterol, metaproterenol, salbutamol, or other inhaled or nebulized beta-agonist medicines   - Long-term-control: steroids, cromolyn, or other anti-inflammatory medicines.     *No Answer* 7. OTHER SYMPTOMS: "Do you have any other symptoms? (e.g., runny nose, chest pain, fever)     *No Answer*  Protocols used: ASTHMA ATTACK-A-AH

## 2019-04-17 ENCOUNTER — Ambulatory Visit (INDEPENDENT_AMBULATORY_CARE_PROVIDER_SITE_OTHER): Payer: Medicare HMO | Admitting: Family

## 2019-04-17 ENCOUNTER — Other Ambulatory Visit: Payer: Self-pay

## 2019-04-17 DIAGNOSIS — G4700 Insomnia, unspecified: Secondary | ICD-10-CM

## 2019-04-17 DIAGNOSIS — F419 Anxiety disorder, unspecified: Secondary | ICD-10-CM

## 2019-04-17 DIAGNOSIS — J209 Acute bronchitis, unspecified: Secondary | ICD-10-CM | POA: Diagnosis not present

## 2019-04-17 DIAGNOSIS — R69 Illness, unspecified: Secondary | ICD-10-CM | POA: Diagnosis not present

## 2019-04-17 DIAGNOSIS — J45909 Unspecified asthma, uncomplicated: Secondary | ICD-10-CM

## 2019-04-17 MED ORDER — MONTELUKAST SODIUM 10 MG PO TABS: ORAL_TABLET | ORAL | 1 refills | Status: DC

## 2019-04-17 MED ORDER — AMLODIPINE BESYLATE 10 MG PO TABS: 10.0000 mg | ORAL_TABLET | Freq: Every day | ORAL | 3 refills | Status: DC

## 2019-04-17 MED ORDER — DOCUSATE SODIUM 100 MG PO CAPS: 100.0000 mg | ORAL_CAPSULE | Freq: Every day | ORAL | 3 refills | Status: DC | PRN

## 2019-04-17 MED ORDER — TAMSULOSIN HCL 0.4 MG PO CAPS: ORAL_CAPSULE | ORAL | 1 refills | Status: DC

## 2019-04-17 MED ORDER — VENTOLIN HFA 108 (90 BASE) MCG/ACT IN AERS: INHALATION_SPRAY | RESPIRATORY_TRACT | 5 refills | Status: DC

## 2019-04-17 MED ORDER — FUROSEMIDE 20 MG PO TABS: 20.0000 mg | ORAL_TABLET | Freq: Every day | ORAL | 5 refills | Status: DC | PRN

## 2019-04-17 MED ORDER — LEVOTHYROXINE SODIUM 200 MCG PO TABS: ORAL_TABLET | ORAL | 1 refills | Status: DC

## 2019-04-17 MED ORDER — BENAZEPRIL HCL 10 MG PO TABS: ORAL_TABLET | ORAL | 5 refills | Status: DC

## 2019-04-17 MED ORDER — LOVASTATIN 40 MG PO TABS: 40.0000 mg | ORAL_TABLET | Freq: Every day | ORAL | 1 refills | Status: DC

## 2019-04-17 MED ORDER — ADVAIR DISKUS 250-50 MCG/DOSE IN AEPB: INHALATION_SPRAY | RESPIRATORY_TRACT | 5 refills | Status: DC

## 2019-04-17 NOTE — Progress Notes (Signed)
Virtual Visit via Video Note  I connected with Kyle Blair  on 04/17/19 at 12:00 PM EDT by a video enabled telemedicine application and verified that I am speaking with the correct person using two identifiers. This visit type was conducted due to national recommendations for restrictions regarding the COVID-19 Pandemic (e.g. social distancing).  This format is felt to be most appropriate for this patient at this time.   I discussed the limitations of evaluation and management by telemedicine and the availability of in person appointments. The patient expressed understanding and agreed to proceed.  Only the patient and myself were on today's video visit. The patient was at home and I was in my office at the time of today's visit.   History of Present Illness:  Patient was seen for a virtual visit on 04/10/19 for an acute asthma exacerbation. He was treated with prednisone. Symptoms persisted and he reached out to [place] on 4/20. He declined ED visit despite recommendations. Additional prednisone taper and a zpak was sent to his pharmacy.   Anxiety- has trouble staying asleep. Drinks coffee all morning and then drinks iced tea all afternoon and evening.   Observations/Objective:   Gen: Awake, alert, no acute distress Resp: Breathing is even and non-labored Psych: calm/pleasant demeanor Neuro: Alert and Oriented x 3, + facial symmetry, speech is clear.  Assessment and Plan:  Bronchitis with bronchospasm- clinically improving on prednisone and zpak. Pt is advised to call if symptoms do not continue to improve.  Anxiety/insomnia- discussed importance of cutting back on his caffeine consumption. Recommended no caffeine after noon each day.  Monitor.   Follow Up Instructions:  3 months for a face to face.    I discussed the assessment and treatment plan with the patient. The patient was provided an opportunity to ask questions and all were answered. The patient agreed with the plan and  demonstrated an understanding of the instructions.   The patient was advised to call back or seek an in-person evaluation if the symptoms worsen or if the condition fails to improve as anticipated.    Nance Pear, NP

## 2019-04-17 NOTE — Telephone Encounter (Signed)
On scheduled to be seen today

## 2019-05-01 DIAGNOSIS — G894 Chronic pain syndrome: Secondary | ICD-10-CM | POA: Diagnosis not present

## 2019-05-01 DIAGNOSIS — M545 Low back pain: Secondary | ICD-10-CM | POA: Diagnosis not present

## 2019-05-01 DIAGNOSIS — M179 Osteoarthritis of knee, unspecified: Secondary | ICD-10-CM | POA: Diagnosis not present

## 2019-05-14 ENCOUNTER — Telehealth: Payer: Self-pay

## 2019-05-14 ENCOUNTER — Telehealth (INDEPENDENT_AMBULATORY_CARE_PROVIDER_SITE_OTHER): Payer: Medicare HMO | Admitting: Family

## 2019-05-14 DIAGNOSIS — R69 Illness, unspecified: Secondary | ICD-10-CM | POA: Diagnosis not present

## 2019-05-14 DIAGNOSIS — F17210 Nicotine dependence, cigarettes, uncomplicated: Secondary | ICD-10-CM

## 2019-05-14 DIAGNOSIS — R0989 Other specified symptoms and signs involving the circulatory and respiratory systems: Secondary | ICD-10-CM | POA: Diagnosis not present

## 2019-05-14 DIAGNOSIS — J4520 Mild intermittent asthma, uncomplicated: Secondary | ICD-10-CM

## 2019-05-14 MED ORDER — FLUTICASONE-SALMETEROL 500-50 MCG/DOSE IN AEPB: 1.0000 | INHALATION_SPRAY | Freq: Two times a day (BID) | RESPIRATORY_TRACT | 3 refills | Status: DC

## 2019-05-14 NOTE — Telephone Encounter (Signed)
Virtual Visit via Video Note  I connected with Kyle Blair on 05/14/19 at  by a video enabled telemedicine application and verified that I am speaking with the correct person using two identifiers. This visit type was conducted due to national recommendations for restrictions regarding the COVID-19 Pandemic (e.g. social distancing).  This format is felt to be most appropriate for this patient at this time.   I discussed the limitations of evaluation and management by telemedicine and the availability of in person appointments. The patient expressed understanding and agreed to proceed.  The patient his wife and myself were on today's video visit. The patient was at home and I was in my office at the time of today's visit.   History of Present Illness: Reports that he is "breathing ok." Reports that mucous makes him wheeze and he can't get it out of his chest.  Tried mucinex which helped some to break it up. He denies SOB or fever.  Notes some sweating a little bit.  Reports + fatigue.  Pt reports that he is using advair.           We last saw him on 04/17/19 with acute asthma exacerbation, wheezing/bronchitis symptoms. He was treated with prednisone and zpak. Notes that he did seem to improve following this treatment.  Feels overall though that he has struggled with his breathing for several months. He reports that he continues to drink 8-10 cigarettes a day.    Observations/Objective:   Gen: Tired appearing, no acute distress Resp: Breathing is even and non-labored, no increased work of breathing noted Psych: calm/pleasant demeanor Neuro: Alert and Oriented x 3, + facial symmetry, speech is clear.  Assessment and Plan:  Chest congestion- uncontrolled. Suspect element of underlying COPD with his smoking history. Will increase his advair from 250/50 to 500/50. Restart mucinex. Strongly advised pt to quit smoking as this is the greatest contributor to his asthma/wheezing symptoms. He will  come to imaging tomorrow and complete a chest x-ray for further evaluation. Will also refer to pulmonology.  He is advised to call if increased shortness of breath or if he develops fever.   Follow Up Instructions:    I discussed the assessment and treatment plan with the patient. The patient was provided an opportunity to ask questions and all were answered. The patient agreed with the plan and demonstrated an understanding of the instructions.   The patient was advised to call back or seek an in-person evaluation if the symptoms worsen or if the condition fails to improve as anticipated.    Nance Pear, NP

## 2019-05-14 NOTE — Addendum Note (Signed)
Addended by: [name] on: 05/14/2019 04:48 PM   Modules accepted: Orders

## 2019-05-14 NOTE — Telephone Encounter (Signed)
Lm again for pt to call us back.

## 2019-05-14 NOTE — Telephone Encounter (Signed)
Copied from [place] [id number]. Topic: General - Other >> May 14, 2019  7:02 AM Carolyn Stare wrote:  Asthma issues, wheezing

## 2019-05-14 NOTE — Telephone Encounter (Signed)
Complains of chest congestion and having trouble breathing due to mucus . Patient asking for medication for this. [facility].

## 2019-05-14 NOTE — Telephone Encounter (Signed)
Reports that he is "breathing ok." Reports that mucous makes him wheeze and he can't get it out of his chest.  Tried mucinex which helped some to break it up. He denies SOB or fever.  Notes some sweating a little bit.  Reports + fatigue.  Pt reports that he is using advair.

## 2019-05-14 NOTE — Telephone Encounter (Signed)
Phone call was transitioned to a video visit. See note.

## 2019-05-14 NOTE — Telephone Encounter (Signed)
Lm for patient to call back to get more information on his current symptomps

## 2019-05-15 ENCOUNTER — Ambulatory Visit (HOSPITAL_BASED_OUTPATIENT_CLINIC_OR_DEPARTMENT_OTHER)
Admission: RE | Admit: 2019-05-15 | Discharge: 2019-05-15 | Disposition: A | Discharge status: Home / Self Care | Payer: Medicare HMO | Source: Ambulatory Visit | Attending: Family | Admitting: Family

## 2019-05-15 ENCOUNTER — Other Ambulatory Visit: Payer: Self-pay

## 2019-05-15 DIAGNOSIS — R0989 Other specified symptoms and signs involving the circulatory and respiratory systems: Secondary | ICD-10-CM | POA: Diagnosis not present

## 2019-05-15 DIAGNOSIS — R0602 Shortness of breath: Secondary | ICD-10-CM | POA: Diagnosis not present

## 2019-05-15 NOTE — Telephone Encounter (Signed)
Addendum to office note 05/14/19:  Nicotine dependence, cigarettes, uncomplicated: discussed importance of smoking cessation with patient.

## 2019-05-15 NOTE — Telephone Encounter (Signed)
Okay.  Can you add the smoking dependence to the A&P- I will make sure goes out correct. Thanks, [employer]

## 2019-05-28 ENCOUNTER — Telehealth: Payer: Self-pay | Admitting: Family

## 2019-05-28 NOTE — Telephone Encounter (Signed)
Copied from [place] [id number]. Topic: Quick Communication - Rx Refill/Question >> May 28, 2019 11:44 AM Richardo Priest, NT wrote: Medication:  Lasix  Has the patient contacted their pharmacy? Yes patient states no refills.  Preferred Pharmacy (with phone number or street name):  [place], [place] - [address] [phone] (Phone) [phone] (Fax)  Agent: Please be advised that RX refills may take up to 3 business days. We ask that you follow-up with your pharmacy.

## 2019-05-29 NOTE — Telephone Encounter (Signed)
Per pharmacist rx sent 04-17-19 for 30 tabs with 5 refills was not received. rx called in. Pharmacy will call patient when ready for pick up.

## 2019-06-05 DIAGNOSIS — M170 Bilateral primary osteoarthritis of knee: Secondary | ICD-10-CM | POA: Diagnosis not present

## 2019-06-05 DIAGNOSIS — M25561 Pain in right knee: Secondary | ICD-10-CM | POA: Diagnosis not present

## 2019-06-05 DIAGNOSIS — Z96653 Presence of artificial knee joint, bilateral: Secondary | ICD-10-CM | POA: Diagnosis not present

## 2019-06-05 DIAGNOSIS — G894 Chronic pain syndrome: Secondary | ICD-10-CM | POA: Diagnosis not present

## 2019-06-11 ENCOUNTER — Other Ambulatory Visit: Payer: Self-pay

## 2019-06-11 ENCOUNTER — Ambulatory Visit (INDEPENDENT_AMBULATORY_CARE_PROVIDER_SITE_OTHER): Payer: Medicare HMO | Admitting: Family

## 2019-06-11 VITALS — BP 121/85 | HR 82 | Temp 98.8°F | Resp 16 | Ht 64.0 in | Wt 231.0 lb

## 2019-06-11 DIAGNOSIS — R69 Illness, unspecified: Secondary | ICD-10-CM | POA: Diagnosis not present

## 2019-06-11 DIAGNOSIS — F4321 Adjustment disorder with depressed mood: Secondary | ICD-10-CM

## 2019-06-11 DIAGNOSIS — K130 Diseases of lips: Secondary | ICD-10-CM

## 2019-06-11 DIAGNOSIS — J4541 Moderate persistent asthma with (acute) exacerbation: Secondary | ICD-10-CM | POA: Diagnosis not present

## 2019-06-11 DIAGNOSIS — H1013 Acute atopic conjunctivitis, bilateral: Secondary | ICD-10-CM | POA: Diagnosis not present

## 2019-06-11 MED ORDER — CLOTRIMAZOLE-BETAMETHASONE 1-0.05 % EX CREA: 1.0000 "application " | TOPICAL_CREAM | Freq: Two times a day (BID) | CUTANEOUS | 0 refills | Status: DC

## 2019-06-11 MED ORDER — PREDNISONE 10 MG PO TABS: ORAL_TABLET | ORAL | 0 refills | Status: DC

## 2019-06-11 MED FILL — FLOVENT HFA 110 MCG INHALER: 110 | 30 days supply | Qty: 12 | Fill #1

## 2019-06-11 NOTE — Progress Notes (Signed)
Subjective:    Patient ID: Kyle Blair, male    DOB: [birth year], 58 y.o.   MRN: [id number]  HPI  Patient is a 58 yr old male who presents today with two concerns:  1) Thrush- reports whitish discoloration on both corners of his mouth  2) Teary eyes-reports some burning and tears in his eyes.    3) Asthma- report that he has been feeling tight breathing x 1 week.   Reports that the lost his dog 2 weeks ago.  He is very sad about this because he is often alone during the day an his dog was his friend/companion.   Review of Systems     Past Medical History:  Diagnosis Date  . Arthritis    severe; pt takes strong pain meds daily/legs  . Asthma    MONTH AGO  . BPH (benign prostatic hypertrophy)   . Diverticulosis   . GERD with stricture   . Hiatal hernia   . History of stroke 2004  . Hyperlipidemia   . Hypertension   . Hypothyroid   . Lung nodule 02/03/2012  . Osteoarthritis of left knee   . Polycystic kidney disease   . Stroke ([place])    2004   EQULIBRIUM, AND JUDGING DISTANCE  . Tubular adenoma of colon      Social History   Socioeconomic History  . Marital status: Married    Spouse name: Not on file  . Number of children: 2  . Years of education: Not on file  . Highest education level: Not on file  Occupational History  . Occupation: DISABLED    Employer: UNEMPLOYED  Social Needs  . Financial resource strain: Not on file  . Food insecurity    Worry: Not on file    Inability: Not on file  . Transportation needs    Medical: Not on file    Non-medical: Not on file  Tobacco Use  . Smoking status: Current Every Day Smoker    Packs/day: 0.50    Years: 20.00    Pack years: 10.00    Types: Cigarettes  . Smokeless tobacco: Never Used  . Tobacco comment: 5-6 cigarettes a day  Substance and Sexual Activity  . Alcohol use: No    Alcohol/week: 0.0 standard drinks  . Drug use: No  . Sexual activity: Not on file  Lifestyle  . Physical activity    Days per  week: Not on file    Minutes per session: Not on file  . Stress: Not on file  Relationships  . Social Herbalist on phone: Not on file    Gets together: Not on file    Attends religious service: Not on file    Active member of club or organization: Not on file    Attends meetings of clubs or organizations: Not on file    Relationship status: Not on file  . Intimate partner violence    Fear of current or ex partner: Not on file    Emotionally abused: Not on file    Physically abused: Not on file    Forced sexual activity: Not on file  Other Topics Concern  . Not on file  Social History Narrative   Caffeine use:  1 pot coffee daily, 1 glass tea   Regular exercise:  No. Has active lifestyle.   2 biological and 1 stepson.   Former smoker- quit in 2007   He is on disability- reports that he has a history  of heat stroke.               Past Surgical History:  Procedure Laterality Date  . CARPAL TUNNEL RELEASE    . COLONOSCOPY    . HERNIA REPAIR  2005   with mesh  . KNEE ARTHROSCOPY Bilateral 12/05/13   cyst and bone spur removed from left knee per pt.  Marland Kitchen KNEE ARTHROSCOPY Left 01/2015  . NASAL POLYP EXCISION  1992 or 93  . POLYPECTOMY    . TOTAL KNEE ARTHROPLASTY Right 05/19/2015   Procedure: RIGHT TOTAL KNEE ARTHROPLASTY;  Surgeon: Paralee Cancel, MD;  Location: [facility];  Service: Orthopedics;  Laterality: Right;  . TOTAL KNEE ARTHROPLASTY Left 11/19/2018   Procedure: LEFT  TOTAL KNEE ARTHROPLASTY;  Surgeon: Dorna Leitz, MD;  Location: [facility];  Service: Orthopedics;  Laterality: Left;    Family History  Problem Relation Age of Onset  . Heart disease Mother        pacemaker  . Diabetes Father   . Kidney disease Father   . Diabetes Sister   . Kidney disease Sister   . Colon cancer Neg Hx   . Esophageal cancer Neg Hx   . Rectal cancer Neg Hx   . Stomach cancer Neg Hx   . Prostate cancer Neg Hx   . Pancreatic cancer Neg Hx     Allergies  Allergen Reactions  .  Aspirin Anaphylaxis  . Doxycycline Hyclate Swelling    Facial Swelling  . Ibuprofen Anaphylaxis  . Jynarque [Tolvaptan] Anaphylaxis  . Peanut Butter Flavor Anaphylaxis  . Cephalexin Rash    Current Outpatient Medications on File Prior to Visit  Medication Sig Dispense Refill  . acetaminophen (TYLENOL) 325 MG tablet Take 1-2 tablets (325-650 mg total) by mouth every 6 (six) hours as needed for mild pain or moderate pain. 30 tablet 0  . albuterol (PROVENTIL) (2.5 MG/3ML) 0.083% nebulizer solution Take 3 mLs (2.5 mg total) by nebulization every 6 (six) hours as needed for wheezing or shortness of breath. 75 mL 12  . amLODipine (NORVASC) 10 MG tablet Take 1 tablet (10 mg total) by mouth daily. 30 tablet 3  . apixaban (ELIQUIS) 2.5 MG TABS tablet Take 1 tablet (2.5 mg total) by mouth 2 (two) times daily. 42 tablet 0  . benazepril (LOTENSIN) 10 MG tablet TAKE 1 TABLET(10 MG) BY MOUTH DAILY 30 tablet 5  . docusate sodium (COLACE) 100 MG capsule Take 1 capsule (100 mg total) by mouth daily as needed for mild constipation. 60 capsule 3  . EVZIO 2 MG/0.4ML SOAJ Inject 2 mg into the muscle.   0  . Fluticasone-Salmeterol (ADVAIR) 500-50 MCG/DOSE AEPB Inhale 1 puff into the lungs 2 (two) times a day. 60 each 3  . furosemide (LASIX) 20 MG tablet Take 1 tablet (20 mg total) by mouth daily as needed. 30 tablet 5  . gabapentin (NEURONTIN) 300 MG capsule Take 300 mg by mouth 3 (three) times daily.   0  . levothyroxine (SYNTHROID) 200 MCG tablet TAKE ONE (1) TABLET BY MOUTH EACH DAY BEFORE BREAKFAST 90 tablet 1  . lovastatin (MEVACOR) 40 MG tablet Take 1 tablet (40 mg total) by mouth at bedtime. 90 tablet 1  . montelukast (SINGULAIR) 10 MG tablet TAKE ONE TABLET EVERY MORNING 90 tablet 1  . oxyCODONE-acetaminophen (PERCOCET) 7.5-325 MG tablet Take 1 tablet by mouth every 8 (eight) hours as needed for moderate pain.     . pantoprazole (PROTONIX) 40 MG tablet TAKE ONE (1) TABLET  BY MOUTH EVERY DAY BEFORE  BREAKFAST ON AN EMPTY STOMACH (Patient taking differently: Take 40 mg by mouth daily. ) 90 tablet 3  . tamsulosin (FLOMAX) 0.4 MG CAPS capsule TAKE ONE (1) CAPSULE EACH DAY BY MOUTH 90 capsule 1  . tiZANidine (ZANAFLEX) 2 MG tablet Take 1 tablet (2 mg total) by mouth every 8 (eight) hours as needed for muscle spasms. 40 tablet 0  . VENTOLIN HFA 108 (90 Base) MCG/ACT inhaler INHALE 2 PUFFS INTO THE LUNGS EVERY 4 HOURS AS NEEDED FOR WHEEZING ORSHORTNESS OF BREATH 18 g 5  . XTAMPZA ER 18 MG C12A Take 18 mg by mouth 2 (two) times daily.      No current facility-administered medications on file prior to visit.     BP 121/85 (BP Location: Right Arm, Patient Position: Sitting, Cuff Size: Small)   Pulse 82   Temp 98.8 F (37.1 C) (Oral)   Resp 16   Ht 5\' 4"  (1.626 m)   Wt 231 lb (104.8 kg)   SpO2 95%   BMI 39.65 kg/m    Objective:   Physical Exam Constitutional:      General: He is not in acute distress.    Appearance: He is well-developed.  HENT:     Head: Normocephalic and atraumatic.     Mouth/Throat:     Mouth: Mucous membranes are moist.     Comments: Thick white buildup noted in bilateral corners of lips Eyes:     Comments: Appear watery and mildly irritated  Cardiovascular:     Rate and Rhythm: Normal rate and regular rhythm.     Heart sounds: No murmur.  Pulmonary:     Effort: Pulmonary effort is normal. No respiratory distress.     Breath sounds: Wheezing present. No rales.  Skin:    General: Skin is warm and dry.  Neurological:     Mental Status: He is alert and oriented to person, place, and time.  Psychiatric:        Behavior: Behavior normal.        Thought Content: Thought content normal.           Assessment & Plan:  Angular cheilitis- will rx with topical lotrisone cream. Pt is advised to call if symptoms worsen or if they fail to improve.   Allergic conjunctivitis- declines eye drops. Recommended trial of claritin since it is long acting. He has been  using benadryl.  Acute asthma exacerbation- will rx with prednisone taper.   Grief reaction due to loss of pet- we discussed looking into adopting a new dog to help keep him company.

## 2019-06-11 NOTE — Patient Instructions (Signed)
Add claritin 10mg  once daily for allergies. Apply lotrisone cream twice daily to corners of your mouth- let me know if symptoms are not improved in 1 week. Begin prednisone taper.  Call if asthma symptoms fail to improve with the prednisone.

## 2019-07-04 DIAGNOSIS — M25569 Pain in unspecified knee: Secondary | ICD-10-CM | POA: Diagnosis not present

## 2019-07-04 DIAGNOSIS — M179 Osteoarthritis of knee, unspecified: Secondary | ICD-10-CM | POA: Diagnosis not present

## 2019-07-04 DIAGNOSIS — M545 Low back pain: Secondary | ICD-10-CM | POA: Diagnosis not present

## 2019-07-04 DIAGNOSIS — G894 Chronic pain syndrome: Secondary | ICD-10-CM | POA: Diagnosis not present

## 2019-07-04 DIAGNOSIS — Z79899 Other long term (current) drug therapy: Secondary | ICD-10-CM | POA: Diagnosis not present

## 2019-07-04 DIAGNOSIS — Z79891 Long term (current) use of opiate analgesic: Secondary | ICD-10-CM | POA: Diagnosis not present

## 2019-07-08 ENCOUNTER — Other Ambulatory Visit: Payer: Self-pay

## 2019-07-08 ENCOUNTER — Ambulatory Visit (INDEPENDENT_AMBULATORY_CARE_PROVIDER_SITE_OTHER): Payer: Medicare HMO | Admitting: Family

## 2019-07-08 DIAGNOSIS — J209 Acute bronchitis, unspecified: Secondary | ICD-10-CM | POA: Diagnosis not present

## 2019-07-08 MED ORDER — AZITHROMYCIN 250 MG PO TABS: ORAL_TABLET | ORAL | 0 refills | Status: DC

## 2019-07-08 MED ORDER — PREDNISONE 10 MG PO TABS: ORAL_TABLET | ORAL | 0 refills | Status: DC

## 2019-07-08 MED FILL — AZITHROMYCIN 250 MG TABLET: 250 | 5 days supply | Qty: 6 | Fill #0

## 2019-07-08 MED FILL — predniSONE 10 MG TABS: 10 | 8 days supply | Qty: 20 | Fill #0

## 2019-07-08 NOTE — Progress Notes (Signed)
Virtual Visit via Telephone Note  I connected with Kyle Blair on 07/08/19 at 11:40 AM EDT by telephone and verified that I am speaking with the correct person using two identifiers.  Location: Patient: home Provider: home   I discussed the limitations, risks, security and privacy concerns of performing an evaluation and management service by telephone and the availability of in person appointments. I also discussed with the patient that there may be a patient responsible charge related to this service. The patient expressed understanding and agreed to proceed.   History of Present Illness:  Patient is a 58yr old male (smoker) with hx of asthma who reports that he developed chest congestion on 7/10 PM. Patient reports thick "butterscotch mucous." Reports that breathing got tight.  Reports that he has been taking mucinex 3x a day which is helping. Still wheezing.  Reports brief relief with use of albuterol. Denies fever.   Past Medical History:  Diagnosis Date  . Arthritis    severe; pt takes strong pain meds daily/legs  . Asthma    MONTH AGO  . BPH (benign prostatic hypertrophy)   . Diverticulosis   . GERD with stricture   . Hiatal hernia   . History of stroke 2004  . Hyperlipidemia   . Hypertension   . Hypothyroid   . Lung nodule 02/03/2012  . Osteoarthritis of left knee   . Polycystic kidney disease   . Stroke ([place])    2004   EQULIBRIUM, AND JUDGING DISTANCE  . Tubular adenoma of colon      Social History   Socioeconomic History  . Marital status: Married    Spouse name: Not on file  . Number of children: 2  . Years of education: Not on file  . Highest education level: Not on file  Occupational History  . Occupation: DISABLED    Employer: UNEMPLOYED  Social Needs  . Financial resource strain: Not on file  . Food insecurity    Worry: Not on file    Inability: Not on file  . Transportation needs    Medical: Not on file    Non-medical: Not on file  Tobacco  Use  . Smoking status: Current Every Day Smoker    Packs/day: 0.50    Years: 20.00    Pack years: 10.00    Types: Cigarettes  . Smokeless tobacco: Never Used  . Tobacco comment: 5-6 cigarettes a day  Substance and Sexual Activity  . Alcohol use: No    Alcohol/week: 0.0 standard drinks  . Drug use: No  . Sexual activity: Not on file  Lifestyle  . Physical activity    Days per week: Not on file    Minutes per session: Not on file  . Stress: Not on file  Relationships  . Social Herbalist on phone: Not on file    Gets together: Not on file    Attends religious service: Not on file    Active member of club or organization: Not on file    Attends meetings of clubs or organizations: Not on file    Relationship status: Not on file  . Intimate partner violence    Fear of current or ex partner: Not on file    Emotionally abused: Not on file    Physically abused: Not on file    Forced sexual activity: Not on file  Other Topics Concern  . Not on file  Social History Narrative   Caffeine use:  1 pot  coffee daily, 1 glass tea   Regular exercise:  No. Has active lifestyle.   2 biological and 1 stepson.   Former smoker- quit in 2007   He is on disability- reports that he has a history of heat stroke.               Past Surgical History:  Procedure Laterality Date  . CARPAL TUNNEL RELEASE    . COLONOSCOPY    . HERNIA REPAIR  2005   with mesh  . KNEE ARTHROSCOPY Bilateral 12/05/13   cyst and bone spur removed from left knee per pt.  Marland Kitchen KNEE ARTHROSCOPY Left 01/2015  . NASAL POLYP EXCISION  1992 or 93  . POLYPECTOMY    . TOTAL KNEE ARTHROPLASTY Right 05/19/2015   Procedure: RIGHT TOTAL KNEE ARTHROPLASTY;  Surgeon: Paralee Cancel, MD;  Location: [facility];  Service: Orthopedics;  Laterality: Right;  . TOTAL KNEE ARTHROPLASTY Left 11/19/2018   Procedure: LEFT  TOTAL KNEE ARTHROPLASTY;  Surgeon: Dorna Leitz, MD;  Location: [place];  Service: Orthopedics;  Laterality: Left;     Family History  Problem Relation Age of Onset  . Heart disease Mother        pacemaker  . Diabetes Father   . Kidney disease Father   . Diabetes Sister   . Kidney disease Sister   . Colon cancer Neg Hx   . Esophageal cancer Neg Hx   . Rectal cancer Neg Hx   . Stomach cancer Neg Hx   . Prostate cancer Neg Hx   . Pancreatic cancer Neg Hx     Allergies  Allergen Reactions  . Aspirin Anaphylaxis  . Doxycycline Hyclate Swelling    Facial Swelling  . Ibuprofen Anaphylaxis  . Jynarque [Tolvaptan] Anaphylaxis  . Peanut Butter Flavor Anaphylaxis  . Cephalexin Rash    Current Outpatient Medications on File Prior to Visit  Medication Sig Dispense Refill  . acetaminophen (TYLENOL) 325 MG tablet Take 1-2 tablets (325-650 mg total) by mouth every 6 (six) hours as needed for mild pain or moderate pain. 30 tablet 0  . albuterol (PROVENTIL) (2.5 MG/3ML) 0.083% nebulizer solution Take 3 mLs (2.5 mg total) by nebulization every 6 (six) hours as needed for wheezing or shortness of breath. 75 mL 12  . amLODipine (NORVASC) 10 MG tablet Take 1 tablet (10 mg total) by mouth daily. 30 tablet 3  . apixaban (ELIQUIS) 2.5 MG TABS tablet Take 1 tablet (2.5 mg total) by mouth 2 (two) times daily. 42 tablet 0  . benazepril (LOTENSIN) 10 MG tablet TAKE 1 TABLET(10 MG) BY MOUTH DAILY 30 tablet 5  . clotrimazole-betamethasone (LOTRISONE) cream Apply 1 application topically 2 (two) times daily. 30 g 0  . docusate sodium (COLACE) 100 MG capsule Take 1 capsule (100 mg total) by mouth daily as needed for mild constipation. 60 capsule 3  . EVZIO 2 MG/0.4ML SOAJ Inject 2 mg into the muscle.   0  . Fluticasone-Salmeterol (ADVAIR) 500-50 MCG/DOSE AEPB Inhale 1 puff into the lungs 2 (two) times a day. 60 each 3  . furosemide (LASIX) 20 MG tablet Take 1 tablet (20 mg total) by mouth daily as needed. 30 tablet 5  . gabapentin (NEURONTIN) 300 MG capsule Take 300 mg by mouth 3 (three) times daily.   0  .  levothyroxine (SYNTHROID) 200 MCG tablet TAKE ONE (1) TABLET BY MOUTH EACH DAY BEFORE BREAKFAST 90 tablet 1  . lovastatin (MEVACOR) 40 MG tablet Take 1 tablet (40 mg total)  by mouth at bedtime. 90 tablet 1  . montelukast (SINGULAIR) 10 MG tablet TAKE ONE TABLET EVERY MORNING 90 tablet 1  . oxyCODONE-acetaminophen (PERCOCET) 7.5-325 MG tablet Take 1 tablet by mouth every 8 (eight) hours as needed for moderate pain.     . pantoprazole (PROTONIX) 40 MG tablet TAKE ONE (1) TABLET BY MOUTH EVERY DAY BEFORE BREAKFAST ON AN EMPTY STOMACH (Patient taking differently: Take 40 mg by mouth daily. ) 90 tablet 3  . tamsulosin (FLOMAX) 0.4 MG CAPS capsule TAKE ONE (1) CAPSULE EACH DAY BY MOUTH 90 capsule 1  . tiZANidine (ZANAFLEX) 2 MG tablet Take 1 tablet (2 mg total) by mouth every 8 (eight) hours as needed for muscle spasms. 40 tablet 0  . VENTOLIN HFA 108 (90 Base) MCG/ACT inhaler INHALE 2 PUFFS INTO THE LUNGS EVERY 4 HOURS AS NEEDED FOR WHEEZING ORSHORTNESS OF BREATH 18 g 5  . XTAMPZA ER 18 MG C12A Take 18 mg by mouth 2 (two) times daily.      No current facility-administered medications on file prior to visit.     There were no vitals taken for this visit.     Observations/Objective:   Gen: Awake, alert, no acute distress Resp: Breathing is even and non-labored Psych: calm/pleasant demeanor Neuro: Alert and Oriented x 3, speech is clear.  Assessment and Plan:  Bronchitis with bronchospasm- advised pt to continue mucinex, albuterol. Will add a zpak and prednisone taper. Pt is advised to call if new/worsening symptoms, if he develops fever or if symptoms fail to improve. Also discussed importance of quitting smoking for his lung health.   Follow Up Instructions:    I discussed the assessment and treatment plan with the patient. The patient was provided an opportunity to ask questions and all were answered. The patient agreed with the plan and demonstrated an understanding of the instructions.    The patient was advised to call back or seek an in-person evaluation if the symptoms worsen or if the condition fails to improve as anticipated.  I provided 11 minutes of non-face-to-face time during this encounter.   Nance Pear, NP

## 2019-07-10 NOTE — Telephone Encounter (Signed)
No notes requiered

## 2019-07-16 ENCOUNTER — Other Ambulatory Visit: Payer: Self-pay

## 2019-07-16 ENCOUNTER — Encounter: Payer: Self-pay | Admitting: Family Medicine

## 2019-07-16 ENCOUNTER — Ambulatory Visit (INDEPENDENT_AMBULATORY_CARE_PROVIDER_SITE_OTHER): Payer: Medicare HMO | Admitting: Family Medicine

## 2019-07-16 ENCOUNTER — Ambulatory Visit: Payer: Medicare HMO | Admitting: Family

## 2019-07-16 VITALS — BP 110/72 | HR 59 | Temp 97.8°F | Ht 67.0 in | Wt 239.0 lb

## 2019-07-16 DIAGNOSIS — J4541 Moderate persistent asthma with (acute) exacerbation: Secondary | ICD-10-CM | POA: Diagnosis not present

## 2019-07-16 MED ORDER — METHYLPREDNISOLONE ACETATE 80 MG/ML IJ SUSP
80.0000 mg | Freq: Once | INTRAMUSCULAR | Status: AC
  Administered 2019-07-16: 16:00:00 80 mg via INTRAMUSCULAR

## 2019-07-16 MED ORDER — PREDNISONE 20 MG PO TABS: 40.0000 mg | ORAL_TABLET | Freq: Every day | ORAL | 0 refills | Status: AC

## 2019-07-16 MED FILL — predniSONE 20 MG TABS: 20 | 4 days supply | Qty: 8 | Fill #0

## 2019-07-16 NOTE — Addendum Note (Signed)
Addended by: Sharon Seller B on: 07/16/2019 03:31 PM   Modules accepted: Orders

## 2019-07-16 NOTE — Patient Instructions (Addendum)
If you continue to have asthma flares, please schedule an appointment with Melissa.  Continue to push fluids, practice good hand hygiene, and cover your mouth if you cough.  If you start having fevers, shaking or worsening shortness of breath, seek immediate care.   Start the prednisone tomorrow.  Let us know if you need anything.

## 2019-07-16 NOTE — Progress Notes (Signed)
Chief Complaint  Patient presents with  . Asthma  . Wheezing    Kyle Blair here for asthma exacerbation.  Duration: 2 weeks  +hx of asthma, humidity is a trigger.  Associated symptoms: wheezing, shortness of breath, chest tightness and cough Denies: sinus congestion, sinus pain, rhinorrhea, itchy watery eyes, ear pain, ear drainage, sore throat, myalgia and fevers Treatment to date: SABA Sick contacts: No  ROS:  Const: Denies fevers HEENT: As noted in HPI Lungs: +SOB  Past Medical History:  Diagnosis Date  . Arthritis    severe; pt takes strong pain meds daily/legs  . Asthma    MONTH AGO  . BPH (benign prostatic hypertrophy)   . Diverticulosis   . GERD with stricture   . Hiatal hernia   . History of stroke 2004  . Hyperlipidemia   . Hypertension   . Hypothyroid   . Lung nodule 02/03/2012  . Osteoarthritis of left knee   . Polycystic kidney disease   . Stroke ([place])    2004   EQULIBRIUM, AND JUDGING DISTANCE  . Tubular adenoma of colon     BP 110/72 (BP Location: Left Arm, Patient Position: Sitting, Cuff Size: Large)   Pulse (!) 59   Temp 97.8 F (36.6 C) (Oral)   Ht 5\' 7"  (1.702 m)   Wt 239 lb (108.4 kg)   SpO2 93%   BMI 37.43 kg/m  General: Awake, alert, appears stated age HEENT: AT, [place], ears patent b/l and TM's neg, nares patent w/o discharge, pharynx pink and without exudates, MMM Neck: No masses or asymmetry Heart: RRR Lungs: +diffuse exp wheezes, no accessory muscle use Psych: Age appropriate judgment and insight, normal mood and affect  Moderate persistent asthma with exacerbation - Plan: predniSONE (DELTASONE) 20 MG tablet, start pred tomorrow. Depo 80 mg IM today. If he continues to have flares, rec'd he f/u with PCP to discuss either referral or adjusting his chronic meds.   F/u prn. If starting to experience fevers, shaking, or worsening shortness of breath, seek immediate care. Pt voiced understanding and agreement to the plan.  Pulpotio Bareas, DO 07/16/19 3:04 PM

## 2019-08-01 DIAGNOSIS — M545 Low back pain: Secondary | ICD-10-CM | POA: Diagnosis not present

## 2019-08-01 DIAGNOSIS — G894 Chronic pain syndrome: Secondary | ICD-10-CM | POA: Diagnosis not present

## 2019-08-01 DIAGNOSIS — Z79899 Other long term (current) drug therapy: Secondary | ICD-10-CM | POA: Diagnosis not present

## 2019-08-01 DIAGNOSIS — E669 Obesity, unspecified: Secondary | ICD-10-CM | POA: Diagnosis not present

## 2019-08-01 DIAGNOSIS — Z79891 Long term (current) use of opiate analgesic: Secondary | ICD-10-CM | POA: Diagnosis not present

## 2019-08-01 DIAGNOSIS — M179 Osteoarthritis of knee, unspecified: Secondary | ICD-10-CM | POA: Diagnosis not present

## 2019-08-20 MED FILL — FLOVENT HFA 110 MCG INHALER: 110 | 30 days supply | Qty: 12 | Fill #2

## 2019-09-10 ENCOUNTER — Other Ambulatory Visit: Payer: Self-pay

## 2019-09-11 ENCOUNTER — Ambulatory Visit (INDEPENDENT_AMBULATORY_CARE_PROVIDER_SITE_OTHER): Payer: Medicare HMO | Admitting: Family

## 2019-09-11 ENCOUNTER — Encounter: Payer: Self-pay | Admitting: Family

## 2019-09-11 VITALS — BP 147/91 | HR 95 | Temp 97.7°F | Resp 16 | Wt 225.0 lb

## 2019-09-11 DIAGNOSIS — M79605 Pain in left leg: Secondary | ICD-10-CM | POA: Diagnosis not present

## 2019-09-11 DIAGNOSIS — R030 Elevated blood-pressure reading, without diagnosis of hypertension: Secondary | ICD-10-CM | POA: Diagnosis not present

## 2019-09-11 DIAGNOSIS — R5383 Other fatigue: Secondary | ICD-10-CM

## 2019-09-11 DIAGNOSIS — M79604 Pain in right leg: Secondary | ICD-10-CM

## 2019-09-11 DIAGNOSIS — Z23 Encounter for immunization: Secondary | ICD-10-CM

## 2019-09-11 LAB — CBC WITH DIFFERENTIAL/PLATELET
Basophils Absolute: 0 10*3/uL (ref 0.0–0.1)
Basophils Relative: 0.1 % (ref 0.0–3.0)
Eosinophils Absolute: 0.2 10*3/uL (ref 0.0–0.7)
Eosinophils Relative: 3.6 % (ref 0.0–5.0)
HCT: 48.1 % (ref 39.0–52.0)
Hemoglobin: 16.4 g/dL (ref 13.0–17.0)
Lymphocytes Relative: 25.6 % (ref 12.0–46.0)
Lymphs Abs: 1.6 10*3/uL (ref 0.7–4.0)
MCHC: 34.1 g/dL (ref 30.0–36.0)
MCV: 91.8 fl (ref 78.0–100.0)
Monocytes Absolute: 0.6 10*3/uL (ref 0.1–1.0)
Monocytes Relative: 10 % (ref 3.0–12.0)
Neutro Abs: 3.7 10*3/uL (ref 1.4–7.7)
Neutrophils Relative %: 60.7 % (ref 43.0–77.0)
Platelets: 174 10*3/uL (ref 150.0–400.0)
RBC: 5.24 Mil/uL (ref 4.22–5.81)
RDW: 14.2 % (ref 11.5–15.5)
WBC: 6.1 10*3/uL (ref 4.0–10.5)

## 2019-09-11 LAB — TSH: TSH: 3.49 u[IU]/mL (ref 0.35–4.50)

## 2019-09-11 LAB — COMPREHENSIVE METABOLIC PANEL
ALT: 16 U/L (ref 0–53)
AST: 16 U/L (ref 0–37)
Albumin: 4.7 g/dL (ref 3.5–5.2)
Alkaline Phosphatase: 75 U/L (ref 39–117)
BUN: 9 mg/dL (ref 6–23)
CO2: 27 mEq/L (ref 19–32)
Calcium: 9.6 mg/dL (ref 8.4–10.5)
Chloride: 105 mEq/L (ref 96–112)
Creatinine, Ser: 0.73 mg/dL (ref 0.40–1.50)
GFR: 110.47 mL/min (ref >60.00)
Glucose, Bld: 69 mg/dL — ABNORMAL LOW (ref 70–99)
Potassium: 3.8 mEq/L (ref 3.5–5.1)
Sodium: 140 mEq/L (ref 135–145)
Total Bilirubin: 0.4 mg/dL (ref 0.2–1.2)
Total Protein: 6.9 g/dL (ref 6.0–8.3)

## 2019-09-11 MED ORDER — METHOCARBAMOL 500 MG PO TABS: 500.0000 mg | ORAL_TABLET | Freq: Two times a day (BID) | ORAL | 5 refills | Status: DC

## 2019-09-11 MED ORDER — GABAPENTIN 300 MG PO CAPS: 300.0000 mg | ORAL_CAPSULE | Freq: Three times a day (TID) | ORAL | 5 refills | Status: DC

## 2019-09-11 NOTE — Patient Instructions (Signed)
Please complete lab work prior to leaving.  Restart gabapentin and robaxin.

## 2019-09-11 NOTE — Progress Notes (Signed)
Subjective:    Patient ID: Kyle Blair, male    DOB: 10-26-1961, 58 y.o.   MRN: [id number]  HPI  Patient is a 58 yr old male who presents today with chief complaint of fatigue.  Reports that this started about 3 weeks ago.  Denies associated body aches/fever.  He is off pain medications since July.  Wife was taking his pain medications.  Reports that gabapentin helped his pain.  He was also using Robaxin bid for muscle spasms in his legs. Reports that he is not sleeping due to restless legs.     Review of Systems See HPI  Past Medical History:  Diagnosis Date  . Arthritis    severe; pt takes strong pain meds daily/legs  . Asthma    MONTH AGO  . BPH (benign prostatic hypertrophy)   . Diverticulosis   . GERD with stricture   . Hiatal hernia   . History of stroke 2004  . Hyperlipidemia   . Hypertension   . Hypothyroid   . Lung nodule 02/03/2012  . Osteoarthritis of left knee   . Polycystic kidney disease   . Stroke ([place])    2004   EQULIBRIUM, AND JUDGING DISTANCE  . Tubular adenoma of colon      Social History   Socioeconomic History  . Marital status: Married    Spouse name: Not on file  . Number of children: 2  . Years of education: Not on file  . Highest education level: Not on file  Occupational History  . Occupation: DISABLED    Employer: UNEMPLOYED  Social Needs  . Financial resource strain: Not on file  . Food insecurity    Worry: Not on file    Inability: Not on file  . Transportation needs    Medical: Not on file    Non-medical: Not on file  Tobacco Use  . Smoking status: Current Every Day Smoker    Packs/day: 0.50    Years: 20.00    Pack years: 10.00    Types: Cigarettes  . Smokeless tobacco: Never Used  . Tobacco comment: 5-6 cigarettes a day  Substance and Sexual Activity  . Alcohol use: No    Alcohol/week: 0.0 standard drinks  . Drug use: No  . Sexual activity: Not on file  Lifestyle  . Physical activity    Days per week: Not on  file    Minutes per session: Not on file  . Stress: Not on file  Relationships  . Social Herbalist on phone: Not on file    Gets together: Not on file    Attends religious service: Not on file    Active member of club or organization: Not on file    Attends meetings of clubs or organizations: Not on file    Relationship status: Not on file  . Intimate partner violence    Fear of current or ex partner: Not on file    Emotionally abused: Not on file    Physically abused: Not on file    Forced sexual activity: Not on file  Other Topics Concern  . Not on file  Social History Narrative   Caffeine use:  1 pot coffee daily, 1 glass tea   Regular exercise:  No. Has active lifestyle.   2 biological and 1 stepson.   Former smoker- quit in 2007   He is on disability- reports that he has a history of heat stroke.  Past Surgical History:  Procedure Laterality Date  . CARPAL TUNNEL RELEASE    . COLONOSCOPY    . HERNIA REPAIR  2005   with mesh  . KNEE ARTHROSCOPY Bilateral 12/05/13   cyst and bone spur removed from left knee per pt.  Marland Kitchen KNEE ARTHROSCOPY Left 01/2015  . NASAL POLYP EXCISION  1992 or 93  . POLYPECTOMY    . TOTAL KNEE ARTHROPLASTY Right 05/19/2015   Procedure: RIGHT TOTAL KNEE ARTHROPLASTY;  Surgeon: Paralee Cancel, MD;  Location: [facility];  Service: Orthopedics;  Laterality: Right;  . TOTAL KNEE ARTHROPLASTY Left 11/19/2018   Procedure: LEFT  TOTAL KNEE ARTHROPLASTY;  Surgeon: Dorna Leitz, MD;  Location: [place];  Service: Orthopedics;  Laterality: Left;    Family History  Problem Relation Age of Onset  . Heart disease Mother        pacemaker  . Diabetes Father   . Kidney disease Father   . Diabetes Sister   . Kidney disease Sister   . Colon cancer Neg Hx   . Esophageal cancer Neg Hx   . Rectal cancer Neg Hx   . Stomach cancer Neg Hx   . Prostate cancer Neg Hx   . Pancreatic cancer Neg Hx     Allergies  Allergen Reactions  . Aspirin  Anaphylaxis  . Doxycycline Hyclate Swelling    Facial Swelling  . Ibuprofen Anaphylaxis  . Jynarque [Tolvaptan] Anaphylaxis  . Peanut Butter Flavor Anaphylaxis  . Cephalexin Rash    Current Outpatient Medications on File Prior to Visit  Medication Sig Dispense Refill  . acetaminophen (TYLENOL) 325 MG tablet Take 1-2 tablets (325-650 mg total) by mouth every 6 (six) hours as needed for mild pain or moderate pain. 30 tablet 0  . albuterol (PROVENTIL) (2.5 MG/3ML) 0.083% nebulizer solution Take 3 mLs (2.5 mg total) by nebulization every 6 (six) hours as needed for wheezing or shortness of breath. 75 mL 12  . amLODipine (NORVASC) 10 MG tablet Take 1 tablet (10 mg total) by mouth daily. 30 tablet 3  . apixaban (ELIQUIS) 2.5 MG TABS tablet Take 1 tablet (2.5 mg total) by mouth 2 (two) times daily. 42 tablet 0  . benazepril (LOTENSIN) 10 MG tablet TAKE 1 TABLET(10 MG) BY MOUTH DAILY 30 tablet 5  . clotrimazole-betamethasone (LOTRISONE) cream Apply 1 application topically 2 (two) times daily. 30 g 0  . docusate sodium (COLACE) 100 MG capsule Take 1 capsule (100 mg total) by mouth daily as needed for mild constipation. 60 capsule 3  . EVZIO 2 MG/0.4ML SOAJ Inject 2 mg into the muscle.   0  . Fluticasone-Salmeterol (ADVAIR) 500-50 MCG/DOSE AEPB Inhale 1 puff into the lungs 2 (two) times a day. 60 each 3  . furosemide (LASIX) 20 MG tablet Take 1 tablet (20 mg total) by mouth daily as needed. 30 tablet 5  . levothyroxine (SYNTHROID) 200 MCG tablet TAKE ONE (1) TABLET BY MOUTH EACH DAY BEFORE BREAKFAST 90 tablet 1  . lovastatin (MEVACOR) 40 MG tablet Take 1 tablet (40 mg total) by mouth at bedtime. 90 tablet 1  . montelukast (SINGULAIR) 10 MG tablet TAKE ONE TABLET EVERY MORNING 90 tablet 1  . pantoprazole (PROTONIX) 40 MG tablet TAKE ONE (1) TABLET BY MOUTH EVERY DAY BEFORE BREAKFAST ON AN EMPTY STOMACH (Patient taking differently: Take 40 mg by mouth daily. ) 90 tablet 3  . tamsulosin (FLOMAX) 0.4 MG  CAPS capsule TAKE ONE (1) CAPSULE EACH DAY BY MOUTH 90 capsule 1  .  VENTOLIN HFA 108 (90 Base) MCG/ACT inhaler INHALE 2 PUFFS INTO THE LUNGS EVERY 4 HOURS AS NEEDED FOR WHEEZING ORSHORTNESS OF BREATH 18 g 5  . XTAMPZA ER 18 MG C12A Take 18 mg by mouth 2 (two) times daily.      No current facility-administered medications on file prior to visit.     BP (!) 147/91 (BP Location: Right Arm, Patient Position: Sitting, Cuff Size: Small)   Pulse 95   Temp 97.7 F (36.5 C) (Temporal)   Resp 16   Wt 225 lb (102.1 kg)   SpO2 97%   BMI 35.24 kg/m       Objective:   Physical Exam Constitutional:      General: He is not in acute distress.    Appearance: He is well-developed.  HENT:     Head: Normocephalic and atraumatic.  Cardiovascular:     Rate and Rhythm: Normal rate and regular rhythm.     Heart sounds: No murmur.  Pulmonary:     Effort: Pulmonary effort is normal. No respiratory distress.     Breath sounds: Normal breath sounds. No wheezing or rales.  Musculoskeletal:     Right lower leg: 2+ Edema present.     Left lower leg: 2+ Edema present.  Skin:    General: Skin is warm and dry.  Neurological:     Mental Status: He is alert and oriented to person, place, and time.  Psychiatric:        Behavior: Behavior normal.        Thought Content: Thought content normal.           Assessment & Plan:  Fatigue- ? Secondary to poor sleeping.  Will obtain TSH, CMET, CBC.  Leg pain- resume gabapentin, robaxin. If symptoms fail to improve could consider addition of requip for possible RLS treatment.  Elevated blood pressure- plan to recheck bp in 1 month at his follow up.  BP Readings from Last 3 Encounters:  09/11/19 (!) 147/91  07/16/19 110/72  06/11/19 121/85   Flu shot today.

## 2019-09-17 MED FILL — FLOVENT HFA 110 MCG INHALER: 110 | 30 days supply | Qty: 12 | Fill #3

## 2019-09-25 DIAGNOSIS — Q446 Cystic disease of liver: Secondary | ICD-10-CM | POA: Diagnosis not present

## 2019-09-25 DIAGNOSIS — N183 Chronic kidney disease, stage 3 (moderate): Secondary | ICD-10-CM | POA: Diagnosis not present

## 2019-09-25 DIAGNOSIS — I129 Hypertensive chronic kidney disease with stage 1 through stage 4 chronic kidney disease, or unspecified chronic kidney disease: Secondary | ICD-10-CM | POA: Diagnosis not present

## 2019-09-25 DIAGNOSIS — Q612 Polycystic kidney, adult type: Secondary | ICD-10-CM | POA: Diagnosis not present

## 2019-10-02 ENCOUNTER — Other Ambulatory Visit: Payer: Self-pay | Admitting: Nephrology

## 2019-10-02 DIAGNOSIS — N183 Chronic kidney disease, stage 3 unspecified: Secondary | ICD-10-CM

## 2019-10-02 DIAGNOSIS — Q612 Polycystic kidney, adult type: Secondary | ICD-10-CM

## 2019-10-07 ENCOUNTER — Other Ambulatory Visit: Payer: Self-pay

## 2019-10-08 ENCOUNTER — Other Ambulatory Visit: Payer: Self-pay

## 2019-10-08 ENCOUNTER — Ambulatory Visit (INDEPENDENT_AMBULATORY_CARE_PROVIDER_SITE_OTHER): Payer: Medicare HMO | Admitting: Family

## 2019-10-08 ENCOUNTER — Encounter: Payer: Self-pay | Admitting: Family

## 2019-10-08 VITALS — BP 149/81 | HR 98 | Temp 98.2°F | Resp 16 | Ht 65.0 in | Wt 225.0 lb

## 2019-10-08 DIAGNOSIS — G2581 Restless legs syndrome: Secondary | ICD-10-CM

## 2019-10-08 DIAGNOSIS — R5383 Other fatigue: Secondary | ICD-10-CM

## 2019-10-08 DIAGNOSIS — I10 Essential (primary) hypertension: Secondary | ICD-10-CM

## 2019-10-08 MED ORDER — BENAZEPRIL HCL 20 MG PO TABS: 20.0000 mg | ORAL_TABLET | Freq: Every day | ORAL | 5 refills | Status: DC

## 2019-10-08 MED ORDER — ROPINIROLE HCL 0.5 MG PO TABS: 0.5000 mg | ORAL_TABLET | Freq: Every day | ORAL | 5 refills | Status: DC

## 2019-10-08 NOTE — Progress Notes (Signed)
Subjective:    Patient ID: Kyle Blair, male    DOB: Dec 17, 1961, 58 y.o.   MRN: [id number]  HPI  Patient is a 58 yr old male who presents today for follow up. Last visit he noted fatigue.  He noted that he was not sleeping well due to RLS. He notes that he will have intermittent periods of RLS.  Was up for 1 hr last night.  We discussed good sleep hygiene and obtained routine lab work. TSH, CMET and CBC were unremarkable.   He reports that he thinks his fatigue was related to withdrawal from his pain medications. He notes that he is in some pain but glad to be off of the opiates.  He reports that he still gets swelling of the left knee.    Elevated blood pressure reading-  BP Readings from Last 3 Encounters:  10/08/19 (!) 149/81  09/11/19 (!) 147/91  07/16/19 110/72   Leg pain- last visit we resumed gabapentin and robaxin.  He notes significant improvement on these meds.    He reports improved mood since he made a decision to get out more during the day even though his wife sleeps much of the day.   Review of Systems    see HPI  Past Medical History:  Diagnosis Date  . Arthritis    severe; pt takes strong pain meds daily/legs  . Asthma    MONTH AGO  . BPH (benign prostatic hypertrophy)   . Diverticulosis   . GERD with stricture   . Hiatal hernia   . History of stroke 2004  . Hyperlipidemia   . Hypertension   . Hypothyroid   . Lung nodule 02/03/2012  . Osteoarthritis of left knee   . Polycystic kidney disease   . Stroke ([place])    2004   EQULIBRIUM, AND JUDGING DISTANCE  . Tubular adenoma of colon      Social History   Socioeconomic History  . Marital status: Married    Spouse name: Not on file  . Number of children: 2  . Years of education: Not on file  . Highest education level: Not on file  Occupational History  . Occupation: DISABLED    Employer: UNEMPLOYED  Social Needs  . Financial resource strain: Not on file  . Food insecurity    Worry: Not on  file    Inability: Not on file  . Transportation needs    Medical: Not on file    Non-medical: Not on file  Tobacco Use  . Smoking status: Current Every Day Smoker    Packs/day: 0.50    Years: 20.00    Pack years: 10.00    Types: Cigarettes  . Smokeless tobacco: Never Used  . Tobacco comment: 5-6 cigarettes a day  Substance and Sexual Activity  . Alcohol use: No    Alcohol/week: 0.0 standard drinks  . Drug use: No  . Sexual activity: Not on file  Lifestyle  . Physical activity    Days per week: Not on file    Minutes per session: Not on file  . Stress: Not on file  Relationships  . Social Herbalist on phone: Not on file    Gets together: Not on file    Attends religious service: Not on file    Active member of club or organization: Not on file    Attends meetings of clubs or organizations: Not on file    Relationship status: Not on file  .  Intimate partner violence    Fear of current or ex partner: Not on file    Emotionally abused: Not on file    Physically abused: Not on file    Forced sexual activity: Not on file  Other Topics Concern  . Not on file  Social History Narrative   Caffeine use:  1 pot coffee daily, 1 glass tea   Regular exercise:  No. Has active lifestyle.   2 biological and 1 stepson.   Former smoker- quit in 2007   He is on disability- reports that he has a history of heat stroke.               Past Surgical History:  Procedure Laterality Date  . CARPAL TUNNEL RELEASE    . COLONOSCOPY    . HERNIA REPAIR  2005   with mesh  . KNEE ARTHROSCOPY Bilateral 12/05/13   cyst and bone spur removed from left knee per pt.  [facility] KNEE ARTHROSCOPY Left 01/2015  . NASAL POLYP EXCISION  1992 or 93  . POLYPECTOMY    . TOTAL KNEE ARTHROPLASTY Right 05/19/2015   Procedure: RIGHT TOTAL KNEE ARTHROPLASTY;  Surgeon: Paralee Cancel, MD;  Location: [facility];  Service: Orthopedics;  Laterality: Right;  . TOTAL KNEE ARTHROPLASTY Left 11/19/2018   Procedure:  LEFT  TOTAL KNEE ARTHROPLASTY;  Surgeon: Dorna Leitz, MD;  Location: [facility];  Service: Orthopedics;  Laterality: Left;    Family History  Problem Relation Age of Onset  . Heart disease Mother        pacemaker  . Diabetes Father   . Kidney disease Father   . Diabetes Sister   . Kidney disease Sister   . Colon cancer Neg Hx   . Esophageal cancer Neg Hx   . Rectal cancer Neg Hx   . Stomach cancer Neg Hx   . Prostate cancer Neg Hx   . Pancreatic cancer Neg Hx     Allergies  Allergen Reactions  . Aspirin Anaphylaxis  . Doxycycline Hyclate Swelling    Facial Swelling  . Ibuprofen Anaphylaxis  . Jynarque [Tolvaptan] Anaphylaxis  . Peanut Butter Flavor Anaphylaxis  . Cephalexin Rash    Current Outpatient Medications on File Prior to Visit  Medication Sig Dispense Refill  . acetaminophen (TYLENOL) 325 MG tablet Take 1-2 tablets (325-650 mg total) by mouth every 6 (six) hours as needed for mild pain or moderate pain. 30 tablet 0  . albuterol (PROVENTIL) (2.5 MG/3ML) 0.083% nebulizer solution Take 3 mLs (2.5 mg total) by nebulization every 6 (six) hours as needed for wheezing or shortness of breath. 75 mL 12  . amLODipine (NORVASC) 10 MG tablet Take 1 tablet (10 mg total) by mouth daily. 30 tablet 3  . apixaban (ELIQUIS) 2.5 MG TABS tablet Take 1 tablet (2.5 mg total) by mouth 2 (two) times daily. 42 tablet 0  . benazepril (LOTENSIN) 10 MG tablet TAKE 1 TABLET(10 MG) BY MOUTH DAILY 30 tablet 5  . clotrimazole-betamethasone (LOTRISONE) cream Apply 1 application topically 2 (two) times daily. 30 g 0  . docusate sodium (COLACE) 100 MG capsule Take 1 capsule (100 mg total) by mouth daily as needed for mild constipation. 60 capsule 3  . EVZIO 2 MG/0.4ML SOAJ Inject 2 mg into the muscle.   0  . Fluticasone-Salmeterol (ADVAIR) 500-50 MCG/DOSE AEPB Inhale 1 puff into the lungs 2 (two) times a day. 60 each 3  . furosemide (LASIX) 20 MG tablet Take 1 tablet (20 mg total) by  mouth daily as needed.  30 tablet 5  . gabapentin (NEURONTIN) 300 MG capsule Take 1 capsule (300 mg total) by mouth 3 (three) times daily. 90 capsule 5  . levothyroxine (SYNTHROID) 200 MCG tablet TAKE ONE (1) TABLET BY MOUTH EACH DAY BEFORE BREAKFAST 90 tablet 1  . lovastatin (MEVACOR) 40 MG tablet Take 1 tablet (40 mg total) by mouth at bedtime. 90 tablet 1  . methocarbamol (ROBAXIN) 500 MG tablet Take 1 tablet (500 mg total) by mouth 2 (two) times daily. 60 tablet 5  . montelukast (SINGULAIR) 10 MG tablet TAKE ONE TABLET EVERY MORNING 90 tablet 1  . pantoprazole (PROTONIX) 40 MG tablet TAKE ONE (1) TABLET BY MOUTH EVERY DAY BEFORE BREAKFAST ON AN EMPTY STOMACH (Patient taking differently: Take 40 mg by mouth daily. ) 90 tablet 3  . tamsulosin (FLOMAX) 0.4 MG CAPS capsule TAKE ONE (1) CAPSULE EACH DAY BY MOUTH 90 capsule 1  . VENTOLIN HFA 108 (90 Base) MCG/ACT inhaler INHALE 2 PUFFS INTO THE LUNGS EVERY 4 HOURS AS NEEDED FOR WHEEZING ORSHORTNESS OF BREATH 18 g 5  . XTAMPZA ER 18 MG C12A Take 18 mg by mouth 2 (two) times daily.      No current facility-administered medications on file prior to visit.     BP (!) 149/81 (BP Location: Right Arm, Patient Position: Sitting, Cuff Size: Small)   Pulse 98   Temp 98.2 F (36.8 C) (Temporal)   Resp 16   Ht 5\' 5"  (1.651 m)   Wt 225 lb (102.1 kg)   SpO2 97%   BMI 37.44 kg/m    Objective:   Physical Exam Constitutional:      General: He is not in acute distress.    Appearance: He is well-developed.  HENT:     Head: Normocephalic and atraumatic.  Cardiovascular:     Rate and Rhythm: Normal rate and regular rhythm.     Heart sounds: No murmur.  Pulmonary:     Effort: Pulmonary effort is normal. No respiratory distress.     Breath sounds: Normal breath sounds. No wheezing or rales.  Skin:    General: Skin is warm and dry.  Neurological:     Mental Status: He is alert and oriented to person, place, and time.  Psychiatric:        Behavior: Behavior normal.         Thought Content: Thought content normal.           Assessment & Plan:  HTN- uncontrolled. Will increase benazepril from 10mg  to 20mg . Continue current dose of amlodipine.  RLS- uncontrolled. Trial of requip.  Fatigue- improved.  He has adjusted to being off of his opiates.  Monitor.

## 2019-10-08 NOTE — Patient Instructions (Signed)
Please increase benazepril from 10mg  to 20mg  once daily for blood pressure. Start Requip at bedtime for restless leg.

## 2019-10-16 ENCOUNTER — Other Ambulatory Visit: Payer: Self-pay | Admitting: Family

## 2019-10-18 ENCOUNTER — Other Ambulatory Visit: Payer: Medicare HMO

## 2019-10-23 ENCOUNTER — Other Ambulatory Visit: Payer: Medicare HMO

## 2019-10-23 ENCOUNTER — Ambulatory Visit
Admission: RE | Admit: 2019-10-23 | Discharge: 2019-10-23 | Disposition: A | Discharge status: Home / Self Care | Payer: Medicare HMO | Source: Ambulatory Visit | Attending: Nephrology | Admitting: Nephrology

## 2019-10-23 DIAGNOSIS — Q612 Polycystic kidney, adult type: Secondary | ICD-10-CM

## 2019-10-23 DIAGNOSIS — N183 Chronic kidney disease, stage 3 unspecified: Secondary | ICD-10-CM

## 2019-10-23 DIAGNOSIS — N1330 Unspecified hydronephrosis: Secondary | ICD-10-CM | POA: Diagnosis not present

## 2019-10-23 DIAGNOSIS — K7689 Other specified diseases of liver: Secondary | ICD-10-CM | POA: Diagnosis not present

## 2019-10-29 ENCOUNTER — Other Ambulatory Visit: Payer: Self-pay | Admitting: Family

## 2019-10-29 MED FILL — FLOVENT HFA 110 MCG INHALER: 110 | 30 days supply | Qty: 12 | Fill #4

## 2019-10-30 ENCOUNTER — Other Ambulatory Visit: Payer: Self-pay | Admitting: Family

## 2019-10-30 MED ORDER — AMLODIPINE BESYLATE 10 MG PO TABS: 10.0000 mg | ORAL_TABLET | Freq: Every day | ORAL | 5 refills | Status: DC

## 2019-11-08 ENCOUNTER — Ambulatory Visit (INDEPENDENT_AMBULATORY_CARE_PROVIDER_SITE_OTHER): Payer: Medicare HMO | Admitting: Family

## 2019-11-08 ENCOUNTER — Other Ambulatory Visit: Payer: Self-pay

## 2019-11-08 ENCOUNTER — Encounter: Payer: Self-pay | Admitting: Family

## 2019-11-08 VITALS — BP 132/81 | HR 87 | Temp 98.0°F | Resp 16 | Ht 65.0 in | Wt 230.0 lb

## 2019-11-08 DIAGNOSIS — J45909 Unspecified asthma, uncomplicated: Secondary | ICD-10-CM | POA: Diagnosis not present

## 2019-11-08 DIAGNOSIS — Z1159 Encounter for screening for other viral diseases: Secondary | ICD-10-CM | POA: Diagnosis not present

## 2019-11-08 DIAGNOSIS — I10 Essential (primary) hypertension: Secondary | ICD-10-CM

## 2019-11-08 LAB — BASIC METABOLIC PANEL
BUN: 11 mg/dL (ref 6–23)
CO2: 31 mEq/L (ref 19–32)
Calcium: 9.3 mg/dL (ref 8.4–10.5)
Chloride: 100 mEq/L (ref 96–112)
Creatinine, Ser: 0.76 mg/dL (ref 0.40–1.50)
GFR: 105.39 mL/min (ref >60.00)
Glucose, Bld: 96 mg/dL (ref 70–99)
Potassium: 3.9 mEq/L (ref 3.5–5.1)
Sodium: 139 mEq/L (ref 135–145)

## 2019-11-08 MED ORDER — PREDNISONE 10 MG PO TABS: ORAL_TABLET | ORAL | 0 refills | Status: DC

## 2019-11-08 MED ORDER — METHYLPREDNISOLONE SODIUM SUCC 125 MG IJ SOLR
125.0000 mg | Freq: Once | INTRAMUSCULAR | Status: AC
  Administered 2019-11-08: 125 mg via INTRAMUSCULAR

## 2019-11-08 MED FILL — predniSONE 10 MG TABS: 10 | 8 days supply | Qty: 20 | Fill #0

## 2019-11-08 NOTE — Patient Instructions (Signed)
Please complete lab work prior to leaving. Begin prednisone taper. Continue albuterol every 6 hours. It is important that you quit smoking. Please call if symptoms worsen or if not improved in 2-3 days.

## 2019-11-08 NOTE — Progress Notes (Signed)
Subjective:    Patient ID: Kyle Blair, male    DOB: 03/02/1961, 58 y.o.   MRN: [id number]  HPI  Patient is a 58 yr old male who presents today for follow up.  HTN- last visit we increased benazepril from 10 mg to 20 mg.   BP Readings from Last 3 Encounters:  11/08/19 132/81  10/08/19 (!) 149/81  09/11/19 (!) 147/91   Asthma- reports that he is not responding to albuterol. Still smokes some.  Reports that he has been trying to stay still and not move around too much as this makes him more short of breath. . Started a few days a go.    RLS- last visit we gave a trial of Requip. Reports significant improvement.    Review of Systems See HPI  Past Medical History:  Diagnosis Date   Arthritis    severe; pt takes strong pain meds daily/legs   Asthma    MONTH AGO   BPH (benign prostatic hypertrophy)    Diverticulosis    GERD with stricture    Hiatal hernia    History of stroke 2004   Hyperlipidemia    Hypertension    Hypothyroid    Lung nodule 02/03/2012   Osteoarthritis of left knee    Polycystic kidney disease    Stroke ([place])    2004   EQULIBRIUM, AND JUDGING DISTANCE   Tubular adenoma of colon      Social History   Socioeconomic History   Marital status: Married    Spouse name: Not on file   Number of children: 2   Years of education: Not on file   Highest education level: Not on file  Occupational History   Occupation: Tour manager: UNEMPLOYED  Social Designer, fashion/clothing strain: Not on file   Food insecurity    Worry: Not on file    Inability: Not on file   Transportation needs    Medical: Not on file    Non-medical: Not on file  Tobacco Use   Smoking status: Current Every Day Smoker    Packs/day: 0.50    Years: 20.00    Pack years: 10.00    Types: Cigarettes   Smokeless tobacco: Never Used   Tobacco comment: 5-6 cigarettes a day  Substance and Sexual Activity   Alcohol use: No    Alcohol/week: 0.0  standard drinks   Drug use: No   Sexual activity: Not on file  Lifestyle   Physical activity    Days per week: Not on file    Minutes per session: Not on file   Stress: Not on file  Relationships   Social connections    Talks on phone: Not on file    Gets together: Not on file    Attends religious service: Not on file    Active member of club or organization: Not on file    Attends meetings of clubs or organizations: Not on file    Relationship status: Not on file   Intimate partner violence    Fear of current or ex partner: Not on file    Emotionally abused: Not on file    Physically abused: Not on file    Forced sexual activity: Not on file  Other Topics Concern   Not on file  Social History Narrative   Caffeine use:  1 pot coffee daily, 1 glass tea   Regular exercise:  No. Has active lifestyle.   2 biological and  1 stepson.   Former smoker- quit in 2007   He is on disability- reports that he has a history of heat stroke.               Past Surgical History:  Procedure Laterality Date   CARPAL TUNNEL RELEASE     COLONOSCOPY     HERNIA REPAIR  2005   with mesh   KNEE ARTHROSCOPY Bilateral 12/05/13   cyst and bone spur removed from left knee per pt.   KNEE ARTHROSCOPY Left 01/2015   NASAL POLYP EXCISION  1992 or 93   POLYPECTOMY     TOTAL KNEE ARTHROPLASTY Right 05/19/2015   Procedure: RIGHT TOTAL KNEE ARTHROPLASTY;  Surgeon: Paralee Cancel, MD;  Location: [facility];  Service: Orthopedics;  Laterality: Right;   TOTAL KNEE ARTHROPLASTY Left 11/19/2018   Procedure: LEFT  TOTAL KNEE ARTHROPLASTY;  Surgeon: Dorna Leitz, MD;  Location: [facility];  Service: Orthopedics;  Laterality: Left;    Family History  Problem Relation Age of Onset   Heart disease Mother        pacemaker   Diabetes Father    Kidney disease Father    Diabetes Sister    Kidney disease Sister    Colon cancer Neg Hx    Esophageal cancer Neg Hx    Rectal cancer Neg Hx    Stomach  cancer Neg Hx    Prostate cancer Neg Hx    Pancreatic cancer Neg Hx     Allergies  Allergen Reactions   Aspirin Anaphylaxis   Doxycycline Hyclate Swelling    Facial Swelling   Ibuprofen Anaphylaxis   Jynarque [Tolvaptan] Anaphylaxis   Peanut Butter Flavor Anaphylaxis   Cephalexin Rash    Current Outpatient Medications on File Prior to Visit  Medication Sig Dispense Refill   acetaminophen (TYLENOL) 325 MG tablet Take 1-2 tablets (325-650 mg total) by mouth every 6 (six) hours as needed for mild pain or moderate pain. 30 tablet 0   albuterol (PROVENTIL) (2.5 MG/3ML) 0.083% nebulizer solution Take 3 mLs (2.5 mg total) by nebulization every 6 (six) hours as needed for wheezing or shortness of breath. 75 mL 12   amLODipine (NORVASC) 10 MG tablet Take 1 tablet (10 mg total) by mouth daily. 30 tablet 5   apixaban (ELIQUIS) 2.5 MG TABS tablet Take 1 tablet (2.5 mg total) by mouth 2 (two) times daily. 42 tablet 0   benazepril (LOTENSIN) 20 MG tablet Take 1 tablet (20 mg total) by mouth daily. 30 tablet 5   clotrimazole-betamethasone (LOTRISONE) cream Apply 1 application topically 2 (two) times daily. 30 g 0   docusate sodium (COLACE) 100 MG capsule Take 1 capsule (100 mg total) by mouth daily as needed for mild constipation. 60 capsule 3   EVZIO 2 MG/0.4ML SOAJ Inject 2 mg into the muscle.   0   Fluticasone-Salmeterol (ADVAIR) 500-50 MCG/DOSE AEPB Inhale 1 puff into the lungs 2 (two) times a day. 60 each 3   furosemide (LASIX) 20 MG tablet Take 1 tablet (20 mg total) by mouth daily as needed. 30 tablet 5   gabapentin (NEURONTIN) 300 MG capsule Take 1 capsule (300 mg total) by mouth 3 (three) times daily. 90 capsule 5   levothyroxine (SYNTHROID) 200 MCG tablet TAKE ONE (1) TABLET BY MOUTH EACH DAY BEFORE BREAKFAST 90 tablet 1   lovastatin (MEVACOR) 40 MG tablet Take 1 tablet (40 mg total) by mouth at bedtime. 90 tablet 1   methocarbamol (ROBAXIN) 500 MG tablet Take  1 tablet  (500 mg total) by mouth 2 (two) times daily. 60 tablet 5   montelukast (SINGULAIR) 10 MG tablet TAKE ONE TABLET EVERY MORNING 90 tablet 1   pantoprazole (PROTONIX) 40 MG tablet TAKE ONE (1) TABLET BY MOUTH EVERY DAY BEFORE BREAKFAST ON AN EMPTY STOMACH (Patient taking differently: Take 40 mg by mouth daily. ) 90 tablet 3   rOPINIRole (REQUIP) 0.5 MG tablet Take 1 tablet (0.5 mg total) by mouth at bedtime. 30 tablet 5   tamsulosin (FLOMAX) 0.4 MG CAPS capsule TAKE ONE (1) CAPSULE EACH DAY BY MOUTH 90 capsule 1   VENTOLIN HFA 108 (90 Base) MCG/ACT inhaler INHALE 2 PUFFS INTO THE LUNGS EVERY 4 HOURS AS NEEDED FOR WHEEZING OR SHORTNESS OF BREATH 18 g 5   XTAMPZA ER 18 MG C12A Take 18 mg by mouth 2 (two) times daily.      No current facility-administered medications on file prior to visit.     BP 132/81 (BP Location: Right Arm, Patient Position: Sitting, Cuff Size: Large)    Pulse 87    Temp 98 F (36.7 C) (Temporal)    Resp 16    Ht 5\' 5"  (1.651 m)    Wt 230 lb (104.3 kg)    SpO2 98%    BMI 38.27 kg/m       Objective:   Physical Exam Constitutional:      General: He is not in acute distress.    Appearance: He is well-developed.  HENT:     Head: Normocephalic and atraumatic.  Cardiovascular:     Rate and Rhythm: Normal rate and regular rhythm.     Heart sounds: No murmur.  Pulmonary:     Effort: Pulmonary effort is normal. No respiratory distress.     Breath sounds: Wheezing present. No rales.  Skin:    General: Skin is warm and dry.  Neurological:     Mental Status: He is alert and oriented to person, place, and time.  Psychiatric:        Behavior: Behavior normal.        Thought Content: Thought content normal.           Assessment & Plan:  Acute asthma exacerbation- will rx with prednisone taper.  IM solumedrol given in the office.  Continue albuterol. Counseled on smoking cessation.   HTN- bp improved on increased dose of lisinopril. Obtain follow up bmet.

## 2019-11-11 ENCOUNTER — Encounter: Payer: Self-pay | Admitting: Family

## 2019-11-11 LAB — HEPATITIS C ANTIBODY
Hepatitis C Ab: NONREACTIVE
SIGNAL TO CUT-OFF: 0.02 (ref <1.00)

## 2019-11-11 NOTE — Progress Notes (Signed)
Mailed out to pt 

## 2019-11-12 ENCOUNTER — Other Ambulatory Visit: Payer: Self-pay | Admitting: Family

## 2019-11-12 DIAGNOSIS — R69 Illness, unspecified: Secondary | ICD-10-CM | POA: Diagnosis not present

## 2019-12-16 ENCOUNTER — Other Ambulatory Visit: Payer: Self-pay

## 2019-12-16 ENCOUNTER — Ambulatory Visit: Payer: Self-pay | Admitting: *Deleted

## 2019-12-16 ENCOUNTER — Ambulatory Visit (INDEPENDENT_AMBULATORY_CARE_PROVIDER_SITE_OTHER): Payer: Medicare HMO | Admitting: Family

## 2019-12-16 DIAGNOSIS — N529 Male erectile dysfunction, unspecified: Secondary | ICD-10-CM

## 2019-12-16 DIAGNOSIS — G2581 Restless legs syndrome: Secondary | ICD-10-CM | POA: Diagnosis not present

## 2019-12-16 DIAGNOSIS — J45901 Unspecified asthma with (acute) exacerbation: Secondary | ICD-10-CM

## 2019-12-16 MED ORDER — AZITHROMYCIN 250 MG PO TABS: ORAL_TABLET | ORAL | 0 refills | Status: DC

## 2019-12-16 MED ORDER — SILDENAFIL CITRATE 20 MG PO TABS: ORAL_TABLET | ORAL | 5 refills | Status: DC

## 2019-12-16 MED FILL — AZITHROMYCIN 250 MG TABS: 250 | 5 days supply | Qty: 6 | Fill #0

## 2019-12-16 MED FILL — FLOVENT HFA 110 MCG INHALER: 110 | 30 days supply | Qty: 12 | Fill #5

## 2019-12-16 NOTE — Telephone Encounter (Signed)
Appt scheduled

## 2019-12-16 NOTE — Progress Notes (Signed)
Virtual Visit via Video Note  I connected with Kyle Blair on 12/16/19 at  1:00 PM EST by a video enabled telemedicine application and verified that I am speaking with the correct person using two identifiers.  Location: Patient: home Provider: home   I discussed the limitations of evaluation and management by telemedicine and the availability of in person appointments. The patient expressed understanding and agreed to proceed.  History of Present Illness:  Patient is a 58 yr old male who presents today with chief complaint of wheezing and cough. Reports that symptoms began 1 week ago.  Reports that he has been using generic mucinex without much improvement.  He is maintained on advair 500, singulair, prn albuterol.  Notes temporary relief with albuterol nebulizer.    RLS-notes some improvement with Requip which he is pleased about but also notes new problem with ED. Wonders if it is related to the requip    Past Medical History:  Diagnosis Date  . Arthritis    severe; pt takes strong pain meds daily/legs  . Asthma    MONTH AGO  . BPH (benign prostatic hypertrophy)   . Diverticulosis   . GERD with stricture   . Hiatal hernia   . History of stroke 2004  . Hyperlipidemia   . Hypertension   . Hypothyroid   . Lung nodule 02/03/2012  . Osteoarthritis of left knee   . Polycystic kidney disease   . Stroke ([place])    2004   EQULIBRIUM, AND JUDGING DISTANCE  . Tubular adenoma of colon      Social History   Socioeconomic History  . Marital status: Married    Spouse name: Not on file  . Number of children: 2  . Years of education: Not on file  . Highest education level: Not on file  Occupational History  . Occupation: DISABLED    Employer: UNEMPLOYED  Tobacco Use  . Smoking status: Current Every Day Smoker    Packs/day: 0.50    Years: 20.00    Pack years: 10.00    Types: Cigarettes  . Smokeless tobacco: Never Used  . Tobacco comment: 5-6 cigarettes a day  Substance and  Sexual Activity  . Alcohol use: No    Alcohol/week: 0.0 standard drinks  . Drug use: No  . Sexual activity: Not on file  Other Topics Concern  . Not on file  Social History Narrative   Caffeine use:  1 pot coffee daily, 1 glass tea   Regular exercise:  No. Has active lifestyle.   2 biological and 1 stepson.   Former smoker- quit in 2007   He is on disability- reports that he has a history of heat stroke.              Social Determinants of Health   Financial Resource Strain:   . Difficulty of Paying Living Expenses: Not on file  Food Insecurity:   . Worried About Charity fundraiser in the Last Year: Not on file  . Ran Out of Food in the Last Year: Not on file  Transportation Needs:   . Lack of Transportation (Medical): Not on file  . Lack of Transportation (Non-Medical): Not on file  Physical Activity:   . Days of Exercise per Week: Not on file  . Minutes of Exercise per Session: Not on file  Stress:   . Feeling of Stress : Not on file  Social Connections:   . Frequency of Communication with Friends and Family: Not  on file  . Frequency of Social Gatherings with Friends and Family: Not on file  . Attends Religious Services: Not on file  . Active Member of Clubs or Organizations: Not on file  . Attends Archivist Meetings: Not on file  . Marital Status: Not on file  Intimate Partner Violence:   . Fear of Current or Ex-Partner: Not on file  . Emotionally Abused: Not on file  . Physically Abused: Not on file  . Sexually Abused: Not on file    Past Surgical History:  Procedure Laterality Date  . CARPAL TUNNEL RELEASE    . COLONOSCOPY    . HERNIA REPAIR  2005   with mesh  . KNEE ARTHROSCOPY Bilateral 12/05/13   cyst and bone spur removed from left knee per pt.  Marland Kitchen KNEE ARTHROSCOPY Left 01/2015  . NASAL POLYP EXCISION  1992 or 93  . POLYPECTOMY    . TOTAL KNEE ARTHROPLASTY Right 05/19/2015   Procedure: RIGHT TOTAL KNEE ARTHROPLASTY;  Surgeon: Paralee Cancel,  MD;  Location: [facility];  Service: Orthopedics;  Laterality: Right;  . TOTAL KNEE ARTHROPLASTY Left 11/19/2018   Procedure: LEFT  TOTAL KNEE ARTHROPLASTY;  Surgeon: Dorna Leitz, MD;  Location: [facility];  Service: Orthopedics;  Laterality: Left;    Family History  Problem Relation Age of Onset  . Heart disease Mother        pacemaker  . Diabetes Father   . Kidney disease Father   . Diabetes Sister   . Kidney disease Sister   . Colon cancer Neg Hx   . Esophageal cancer Neg Hx   . Rectal cancer Neg Hx   . Stomach cancer Neg Hx   . Prostate cancer Neg Hx   . Pancreatic cancer Neg Hx     Allergies  Allergen Reactions  . Aspirin Anaphylaxis  . Doxycycline Hyclate Swelling    Facial Swelling  . Ibuprofen Anaphylaxis  . Jynarque [Tolvaptan] Anaphylaxis  . Peanut Butter Flavor Anaphylaxis  . Cephalexin Rash    Current Outpatient Medications on File Prior to Visit  Medication Sig Dispense Refill  . acetaminophen (TYLENOL) 325 MG tablet Take 1-2 tablets (325-650 mg total) by mouth every 6 (six) hours as needed for mild pain or moderate pain. 30 tablet 0  . ADVAIR DISKUS 500-50 MCG/DOSE AEPB USE ONE INHALATION TWICE A DAY. 60 each 3  . albuterol (PROVENTIL) (2.5 MG/3ML) 0.083% nebulizer solution USE 1 VIAL VIA NEBULIZER EVERY 6 HOURS AS NEEDED FOR WHEEZING OR SHORTNESS OF BREATH 75 mL 12  . amLODipine (NORVASC) 10 MG tablet Take 1 tablet (10 mg total) by mouth daily. 30 tablet 5  . apixaban (ELIQUIS) 2.5 MG TABS tablet Take 1 tablet (2.5 mg total) by mouth 2 (two) times daily. 42 tablet 0  . benazepril (LOTENSIN) 20 MG tablet Take 1 tablet (20 mg total) by mouth daily. 30 tablet 5  . clotrimazole-betamethasone (LOTRISONE) cream Apply 1 application topically 2 (two) times daily. 30 g 0  . docusate sodium (COLACE) 100 MG capsule Take 1 capsule (100 mg total) by mouth daily as needed for mild constipation. 60 capsule 3  . furosemide (LASIX) 20 MG tablet Take 1 tablet (20 mg total) by mouth  daily as needed. 30 tablet 5  . gabapentin (NEURONTIN) 300 MG capsule Take 1 capsule (300 mg total) by mouth 3 (three) times daily. 90 capsule 5  . levothyroxine (SYNTHROID) 200 MCG tablet TAKE ONE TABLET BY MOUTH EVERY MORNING BEFORE BREAKFAST 90 tablet 1  .  lovastatin (MEVACOR) 40 MG tablet Take 1 tablet (40 mg total) by mouth at bedtime. 90 tablet 1  . methocarbamol (ROBAXIN) 500 MG tablet Take 1 tablet (500 mg total) by mouth 2 (two) times daily. 60 tablet 5  . montelukast (SINGULAIR) 10 MG tablet TAKE ONE TABLET EVERY MORNING 90 tablet 1  . pantoprazole (PROTONIX) 40 MG tablet TAKE ONE (1) TABLET BY MOUTH EVERY DAY BEFORE BREAKFAST ON AN EMPTY STOMACH (Patient taking differently: Take 40 mg by mouth daily. ) 90 tablet 3  . predniSONE (DELTASONE) 10 MG tablet 4 tabs by mouth once daily for 2 days, then 3 tabs daily x 2 days, then 2 tabs daily x 2 days, then 1 tab daily x 2 days 20 tablet 0  . rOPINIRole (REQUIP) 0.5 MG tablet Take 1 tablet (0.5 mg total) by mouth at bedtime. 30 tablet 5  . tamsulosin (FLOMAX) 0.4 MG CAPS capsule TAKE ONE CAPSULE BY MOUTH DAILY 90 capsule 1  . VENTOLIN HFA 108 (90 Base) MCG/ACT inhaler INHALE 2 PUFFS INTO THE LUNGS EVERY 4 HOURS AS NEEDED FOR WHEEZING OR SHORTNESS OF BREATH 18 g 5   No current facility-administered medications on file prior to visit.    There were no vitals taken for this visit.   Observations/Objective:   Gen: Awake, alert, no acute distress Resp: Breathing is even and non-labored Psych: calm/pleasant demeanor Neuro: Alert and Oriented x 3, + facial symmetry, speech is clear.   Assessment and Plan:  Asthma with acute exacerbation- will rx with azithromycin, prednisone taper. Continue singulair, albuterol, advair. Refer to pulmonology for consultation due to recurrent flares and need for steroid tapers.  Pt is reminded about the importance of quitting smoking.    ED- wishes to continue requip. (reviewed side effect profile and there  is a 3% report of ED associated with requip use).  Will give a trial of generic sildenafil.    RLS- stable on requip, continue same.   Follow Up Instructions:    I discussed the assessment and treatment plan with the patient. The patient was provided an opportunity to ask questions and all were answered. The patient agreed with the plan and demonstrated an understanding of the instructions.   The patient was advised to call back or seek an in-person evaluation if the symptoms worsen or if the condition fails to improve as anticipated.  Nance Pear, NP

## 2019-12-16 NOTE — Telephone Encounter (Signed)
   Reason for Disposition . [1] Longstanding difficulty breathing (e.g., CHF, COPD, emphysema) AND [2] WORSE than normal  Answer Assessment - Initial Assessment Questions 1. RESPIRATORY STATUS: "Describe your breathing?" (e.g., wheezing, shortness of breath, unable to speak, severe coughing)      Chest tightness periodically during the day 2. ONSET: "When did this breathing problem begin?"      1 week 3. PATTERN "Does the difficult breathing come and go, or has it been constant since it started?"      Comes and goes- depends on activity 4. SEVERITY: "How bad is your breathing?" (e.g., mild, moderate, severe)    - MILD: No SOB at rest, mild SOB with walking, speaks normally in sentences, can lay down, no retractions, pulse < 100.    - MODERATE: SOB at rest, SOB with minimal exertion and prefers to sit, cannot lie down flat, speaks in phrases, mild retractions, audible wheezing, pulse 100-120.    - SEVERE: Very SOB at rest, speaks in single words, struggling to breathe, sitting hunched forward, retractions, pulse > 120      moderate 5. RECURRENT SYMPTOM: "Have you had difficulty breathing before?" If so, ask: "When was the last time?" and "What happened that time?"      Yes- history asthma 6. CARDIAC HISTORY: "Do you have any history of heart disease?" (e.g., heart attack, angina, bypass surgery, angioplasty)      no 7. LUNG HISTORY: "Do you have any history of lung disease?"  (e.g., pulmonary embolus, asthma, emphysema)     asthma 8. CAUSE: "What do you think is causing the breathing problem?"      Asthma flare 9. OTHER SYMPTOMS: "Do you have any other symptoms? (e.g., dizziness, runny nose, cough, chest pain, fever)     Some coughing- not bad 10. PREGNANCY: "Is there any chance you are pregnant?" "When was your last menstrual period?"       n/a 11. TRAVEL: "Have you traveled out of the country in the last month?" (e.g., travel history, exposures)       n/a  Protocols used: BREATHING  DIFFICULTY-A-AH

## 2019-12-17 ENCOUNTER — Other Ambulatory Visit: Payer: Self-pay

## 2019-12-17 ENCOUNTER — Ambulatory Visit: Payer: Medicare HMO | Admitting: Family

## 2019-12-17 ENCOUNTER — Other Ambulatory Visit: Payer: Self-pay | Admitting: Internal Medicine

## 2019-12-17 ENCOUNTER — Telehealth: Payer: Self-pay | Admitting: Family

## 2019-12-17 DIAGNOSIS — K219 Gastro-esophageal reflux disease without esophagitis: Secondary | ICD-10-CM

## 2019-12-17 MED ORDER — PREDNISONE 10 MG PO TABS: ORAL_TABLET | ORAL | 0 refills | Status: DC

## 2019-12-17 NOTE — Telephone Encounter (Signed)
rx was sent this AM.

## 2019-12-17 NOTE — Telephone Encounter (Signed)
Medication Refill - Medication: predniSONE (DELTASONE) 10 MG tablet  Per patient said he needs it today, Melissa forgot to send it in with this other medications.  Has the patient contacted their pharmacy? Yes.   (Agent: If no, request that the patient contact the pharmacy for the refill.) (Agent: If yes, when and what did the pharmacy advise?)  Preferred Pharmacy (with phone number or street name): [place], [place]  Agent: Please be advised that RX refills may take up to 3 business days. We ask that you follow-up with your pharmacy.

## 2019-12-24 ENCOUNTER — Ambulatory Visit: Payer: Self-pay | Admitting: *Deleted

## 2019-12-24 ENCOUNTER — Other Ambulatory Visit: Payer: Self-pay

## 2019-12-24 ENCOUNTER — Encounter (HOSPITAL_BASED_OUTPATIENT_CLINIC_OR_DEPARTMENT_OTHER): Payer: Self-pay | Admitting: Emergency Medicine

## 2019-12-24 DIAGNOSIS — I10 Essential (primary) hypertension: Secondary | ICD-10-CM | POA: Diagnosis not present

## 2019-12-24 DIAGNOSIS — J4541 Moderate persistent asthma with (acute) exacerbation: Secondary | ICD-10-CM | POA: Diagnosis not present

## 2019-12-24 DIAGNOSIS — Z7901 Long term (current) use of anticoagulants: Secondary | ICD-10-CM | POA: Insufficient documentation

## 2019-12-24 DIAGNOSIS — R0602 Shortness of breath: Secondary | ICD-10-CM | POA: Diagnosis present

## 2019-12-24 DIAGNOSIS — F17210 Nicotine dependence, cigarettes, uncomplicated: Secondary | ICD-10-CM | POA: Insufficient documentation

## 2019-12-24 DIAGNOSIS — Z79899 Other long term (current) drug therapy: Secondary | ICD-10-CM | POA: Insufficient documentation

## 2019-12-24 DIAGNOSIS — R69 Illness, unspecified: Secondary | ICD-10-CM | POA: Diagnosis not present

## 2019-12-24 DIAGNOSIS — E039 Hypothyroidism, unspecified: Secondary | ICD-10-CM | POA: Diagnosis not present

## 2019-12-24 DIAGNOSIS — Z8673 Personal history of transient ischemic attack (TIA), and cerebral infarction without residual deficits: Secondary | ICD-10-CM | POA: Diagnosis not present

## 2019-12-24 NOTE — Telephone Encounter (Signed)
Spoke to patient. He states that he is feeling a little better this evening. He is scare to go to the ER. I advised him that if his symptoms do not continue to improve overnight that he should go to Urgent care tomorrow AM.  Pt verbalizes understanding.

## 2019-12-24 NOTE — Telephone Encounter (Signed)
Pt called stating that he is having a hard time breathing, and ha snot gotten better since his visit on 12/07/2019; he was previously given prednisone and azithromycin; pt is audibly wheezing on phone, and speaking is difficult due to SOB;recommendations made per nurse triage protocol; he verbalized understanding,but does not want to go to the ED; explained that due to his symptoms, he needs prompt medical evaluation; the pt sees [name], [place]; will route to office for notification.   Reason for Disposition . [1] MODERATE difficulty breathing (e.g., speaks in phrases, SOB even at rest, pulse 100-120) AND [2] NEW-onset or WORSE than normal  Answer Assessment - Initial Assessment Questions 1. RESPIRATORY STATUS: "Describe your breathing?" (e.g., wheezing, shortness of breath, unable to speak, severe coughing)      Wheezing, sob 2. ONSET: "When did this breathing problem begin?"      Seen in office 12/16/2019 3. PATTERN "Does the difficult breathing come and go, or has it been constant since it started?"      constant 4. SEVERITY: "How bad is your breathing?" (e.g., mild, moderate, severe)    - MILD: No SOB at rest, mild SOB with walking, speaks normally in sentences, can lay down, no retractions, pulse < 100.    - MODERATE: SOB at rest, SOB with minimal exertion and prefers to sit, cannot lie down flat, speaks in phrases, mild retractions, audible wheezing, pulse 100-120.    - SEVERE: Very SOB at rest, speaks in single words, struggling to breathe, sitting hunched forward, retractions, pulse > 120    moderate 5. RECURRENT SYMPTOM: "Have you had difficulty breathing before?" If so, ask: "When was the last time?" and "What happened that time?"      Previously seen by PCP 12/17/2019 6. CARDIAC HISTORY: "Do you have any history of heart disease?" (e.g., heart attack, angina, bypass surgery, angioplasty)   HTN 7. LUNG HISTORY: "Do you have any history of lung disease?"  (e.g.,  pulmonary embolus, asthma, emphysema)    asthma 8. CAUSE: "What do you think is causing the breathing problem?"  asthma 9. OTHER SYMPTOMS: "Do you have any other symptoms? (e.g., dizziness, runny nose, cough, chest pain, fever)    Productive cough 10. PREGNANCY: "Is there any chance you are pregnant?" "When was your last menstrual period?"       11. TRAVEL: "Have you traveled out of the country in the last month?" (e.g., travel history, exposures)  Protocols used: BREATHING DIFFICULTY-A-AH

## 2019-12-24 NOTE — ED Triage Notes (Signed)
Patient presents with complaints of shortness of breath; states seen PCP and given prednisone and zpak; states finished both of those; states using nebulizer with no improvement. Ambulatory to triage room with steady gait.

## 2019-12-25 ENCOUNTER — Emergency Department (HOSPITAL_BASED_OUTPATIENT_CLINIC_OR_DEPARTMENT_OTHER)
Admission: EM | Admit: 2019-12-25 | Discharge: 2019-12-25 | Disposition: A | Discharge status: Home / Self Care | Payer: Medicare HMO | Attending: Emergency Medicine | Admitting: Emergency Medicine

## 2019-12-25 DIAGNOSIS — J4541 Moderate persistent asthma with (acute) exacerbation: Secondary | ICD-10-CM | POA: Diagnosis not present

## 2019-12-25 MED ORDER — ALBUTEROL SULFATE HFA 108 (90 BASE) MCG/ACT IN AERS
4.0000 | INHALATION_SPRAY | Freq: Once | RESPIRATORY_TRACT | Status: AC
  Administered 2019-12-25: 4 via RESPIRATORY_TRACT

## 2019-12-25 MED ORDER — IPRATROPIUM BROMIDE HFA 17 MCG/ACT IN AERS
2.0000 | INHALATION_SPRAY | Freq: Once | RESPIRATORY_TRACT | Status: AC
  Administered 2019-12-25: 2 via RESPIRATORY_TRACT
  Filled 2019-12-25: qty 12.9

## 2019-12-25 MED ORDER — METHYLPREDNISOLONE 4 MG PO TBPK: ORAL_TABLET | ORAL | 0 refills | Status: DC

## 2019-12-25 MED ORDER — ALBUTEROL SULFATE HFA 108 (90 BASE) MCG/ACT IN AERS
6.0000 | INHALATION_SPRAY | Freq: Once | RESPIRATORY_TRACT | Status: AC
  Administered 2019-12-25: 6 via RESPIRATORY_TRACT
  Filled 2019-12-25: qty 6.7

## 2019-12-25 MED ORDER — METHYLPREDNISOLONE SODIUM SUCC 125 MG IJ SOLR
125.0000 mg | Freq: Once | INTRAMUSCULAR | Status: AC
  Administered 2019-12-25: 125 mg via INTRAMUSCULAR
  Filled 2019-12-25: qty 2

## 2019-12-25 MED ORDER — IPRATROPIUM BROMIDE HFA 17 MCG/ACT IN AERS
1.0000 | INHALATION_SPRAY | Freq: Once | RESPIRATORY_TRACT | Status: AC
  Administered 2019-12-25: 1 via RESPIRATORY_TRACT

## 2019-12-25 NOTE — ED Provider Notes (Addendum)
[facility] EMERGENCY DEPARTMENT Provider Note  CSN: [id number] Arrival date & time: 12/24/19 2259  Chief Complaint(s) Shortness of Breath  HPI Kyle Blair is a 58 y.o. male   The history is provided by the patient.  Shortness of Breath Severity:  Moderate Onset quality:  Gradual Duration:  1 day Timing:  Constant Progression:  Worsening Chronicity:  Recurrent Relieved by:  Nothing Worsened by:  Exertion and coughing Associated symptoms: cough and wheezing   Associated symptoms: no fever, no sputum production and no vomiting   Risk factors comment:  Asthma. treated by PCP with prednisone and Zpak. initially improved, but completed course and now having SOB again   Past Medical History Past Medical History:  Diagnosis Date  . Arthritis    severe; pt takes strong pain meds daily/legs  . Asthma    MONTH AGO  . BPH (benign prostatic hypertrophy)   . Diverticulosis   . GERD with stricture   . Hiatal hernia   . History of stroke 2004  . Hyperlipidemia   . Hypertension   . Hypothyroid   . Lung nodule 02/03/2012  . Osteoarthritis of left knee   . Polycystic kidney disease   . Stroke ([place])    2004   EQULIBRIUM, AND JUDGING DISTANCE  . Tubular adenoma of colon    Patient Active Problem List   Diagnosis Date Noted  . Primary osteoarthritis of left knee 11/19/2018  . OSA (obstructive sleep apnea) 06/02/2018  . 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 Ingrown toenail 05/08/2018  . Onychomycosis 05/08/2018  . Stroke (cerebrum) ([place]) 01/03/2018  . Acute metabolic encephalopathy 0000000  . Tobacco abuse 01/03/2018  . Acute respiratory failure ([place]) 01/19/2017  . Community acquired pneumonia 01/18/2017  . Influenza with pneumonia 01/18/2017  . Indeterminate pulmonary nodules 07/23/2015  . Obese 05/20/2015  . Undiagnosed cardiac murmurs 04/15/2015  . Asthma with acute exacerbation 03/13/2014  . Hx of adenomatous colonic polyps 05/08/2013  . Barrett's  esophagus 05/03/2013  . Chronic diarrhea 04/02/2013  . HTN (hypertension) 04/02/2013  . Hx of low back pain 09/26/2012  . Arthralgia of right knee 05/02/2012  . Asthma 12/13/2011  . Benign prostatic hyperplasia 12/13/2011  . Hyperlipidemia 12/13/2011  . GERD (gastroesophageal reflux disease) 12/13/2011  . Hypothyroid   . Polycystic kidney disease    Home Medication(s) Prior to Admission medications   Medication Sig Start Date End Date Taking? Authorizing Provider  acetaminophen (TYLENOL) 325 MG tablet Take 1-2 tablets (325-650 mg total) by mouth every 6 (six) hours as needed for mild pain or moderate pain. 08/18/17   Domenic Moras, PA-C  ADVAIR DISKUS 500-50 MCG/DOSE AEPB USE ONE INHALATION TWICE A DAY. 11/12/19   [name], NP  albuterol (PROVENTIL) (2.5 MG/3ML) 0.083% nebulizer solution USE 1 VIAL VIA NEBULIZER EVERY 6 HOURS AS NEEDED FOR WHEEZING OR SHORTNESS OF BREATH 11/12/19   [name], NP  amLODipine (NORVASC) 10 MG tablet Take 1 tablet (10 mg total) by mouth daily. 10/30/19   [name], NP  apixaban (ELIQUIS) 2.5 MG TABS tablet Take 1 tablet (2.5 mg total) by mouth 2 (two) times daily. 11/19/18   Gary Fleet, PA-C  azithromycin (ZITHROMAX) 250 MG tablet Take 2 tablets by mouth today, then 1 tablet by mouth once daily for 4 more days 12/16/19   [name], NP  benazepril (LOTENSIN) 20 MG tablet Take 1 tablet (20 mg total) by mouth daily. 10/08/19   [name], NP  clotrimazole-betamethasone (LOTRISONE) cream Apply 1 application topically 2 (  two) times daily. 06/11/19   [name], NP  docusate sodium (COLACE) 100 MG capsule Take 1 capsule (100 mg total) by mouth daily as needed for mild constipation. 04/17/19   [name], NP  furosemide (LASIX) 20 MG tablet Take 1 tablet (20 mg total) by mouth daily as needed. 04/17/19   [name], NP  gabapentin (NEURONTIN) 300 MG capsule Take 1 capsule (300 mg total) by mouth  3 (three) times daily. 09/11/19   [name], NP  levothyroxine (SYNTHROID) 200 MCG tablet TAKE ONE TABLET BY MOUTH EVERY MORNING BEFORE BREAKFAST 11/12/19   [name], NP  lovastatin (MEVACOR) 40 MG tablet Take 1 tablet (40 mg total) by mouth at bedtime. 04/17/19   [name], NP  methocarbamol (ROBAXIN) 500 MG tablet Take 1 tablet (500 mg total) by mouth 2 (two) times daily. 09/11/19   [name], NP  methylPREDNISolone (MEDROL DOSEPAK) 4 MG TBPK tablet Use as directed on the package 12/25/19   Fatima Blank, MD  montelukast (SINGULAIR) 10 MG tablet TAKE ONE TABLET EVERY MORNING 04/17/19   [name], NP  pantoprazole (PROTONIX) 40 MG tablet Take 1 tablet (40 mg total) by mouth daily. MUST HAVE OFFICE VISIT FOR FURTHER REFILLS 12/17/19   Pyrtle, Lajuan Lines, MD  predniSONE (DELTASONE) 10 MG tablet 4 tabs by mouth once daily for 2 days, then 3 tabs daily x 2 days, then 2 tabs daily x 2 days, then 1 tab daily x 2 days 12/17/19   [name], NP  rOPINIRole (REQUIP) 0.5 MG tablet Take 1 tablet (0.5 mg total) by mouth at bedtime. 10/08/19   [name], NP  sildenafil (REVATIO) 20 MG tablet Take 1 to 2 tablets by mouth 30 minutes prior to sexual activity. 12/16/19   [name], NP  tamsulosin (FLOMAX) 0.4 MG CAPS capsule TAKE ONE CAPSULE BY MOUTH DAILY 11/12/19   [name], NP  VENTOLIN HFA 108 (90 Base) MCG/ACT inhaler INHALE 2 PUFFS INTO THE LUNGS EVERY 4 HOURS AS NEEDED FOR WHEEZING OR SHORTNESS OF BREATH 10/16/19   [name], NP                                                                                                                                    Past Surgical History Past Surgical History:  Procedure Laterality Date  . CARPAL TUNNEL RELEASE    . COLONOSCOPY    . HERNIA REPAIR  2005   with mesh  . KNEE ARTHROSCOPY Bilateral 12/05/13   cyst and bone spur removed from left knee per pt.  Marland Kitchen KNEE  ARTHROSCOPY Left 01/2015  . NASAL POLYP EXCISION  1992 or 93  . POLYPECTOMY    . TOTAL KNEE ARTHROPLASTY Right 05/19/2015   Procedure: RIGHT TOTAL KNEE ARTHROPLASTY;  Surgeon: Paralee Cancel, MD;  Location: [facility];  Service: Orthopedics;  Laterality: Right;  . TOTAL KNEE ARTHROPLASTY Left 11/19/2018   Procedure: LEFT  TOTAL KNEE ARTHROPLASTY;  Surgeon: Dorna Leitz, MD;  Location: [facility];  Service: Orthopedics;  Laterality: Left;   Family History Family History  Problem Relation Age of Onset  . Heart disease Mother        pacemaker  . Diabetes Father   . Kidney disease Father   . Diabetes Sister   . Kidney disease Sister   . Colon cancer Neg Hx   . Esophageal cancer Neg Hx   . Rectal cancer Neg Hx   . Stomach cancer Neg Hx   . Prostate cancer Neg Hx   . Pancreatic cancer Neg Hx     Social History Social History   Tobacco Use  . Smoking status: Current Every Day Smoker    Packs/day: 0.50    Years: 20.00    Pack years: 10.00    Types: Cigarettes  . Smokeless tobacco: Never Used  . Tobacco comment: 5-6 cigarettes a day  Substance Use Topics  . Alcohol use: No    Alcohol/week: 0.0 standard drinks  . Drug use: No   Allergies Aspirin, Doxycycline hyclate, Ibuprofen, Jynarque [tolvaptan], Peanut butter flavor, and Cephalexin  Review of Systems Review of Systems  Constitutional: Negative for fever.  Respiratory: Positive for cough, shortness of breath and wheezing. Negative for sputum production.   Gastrointestinal: Negative for vomiting.   All other systems are reviewed and are negative for acute change except as noted in the HPI  Physical Exam Vital Signs  I have reviewed the triage vital signs BP 134/84   Pulse 75   Temp 98.7 F (37.1 C) (Oral)   Resp 18   Ht 5\' 6"  (1.676 m)   Wt 104 kg   SpO2 94%   BMI 37.01 kg/m   Physical Exam Vitals reviewed.  Constitutional:      General: He is not in acute distress.    Appearance: He is well-developed. He is not  diaphoretic.  HENT:     Head: Normocephalic and atraumatic.     Nose: Nose normal.  Eyes:     General: No scleral icterus.       Right eye: No discharge.        Left eye: No discharge.     Conjunctiva/sclera: Conjunctivae normal.     Pupils: Pupils are equal, round, and reactive to light.  Cardiovascular:     Rate and Rhythm: Normal rate and regular rhythm.     Heart sounds: No murmur. No friction rub. No gallop.   Pulmonary:     Effort: Pulmonary effort is normal. Tachypnea and prolonged expiration present.     Breath sounds: No stridor. Examination of the right-upper field reveals wheezing. Examination of the left-upper field reveals wheezing. Examination of the right-middle field reveals wheezing. Examination of the left-middle field reveals wheezing. Examination of the right-lower field reveals wheezing. Examination of the left-lower field reveals wheezing. Wheezing present. No rales.  Abdominal:     General: There is no distension.     Palpations: Abdomen is soft.     Tenderness: There is no abdominal tenderness.  Musculoskeletal:        General: No tenderness.     Cervical back: Normal range of motion and neck supple.  Skin:    General: Skin is warm and dry.     Findings: No erythema or rash.  Neurological:     Mental Status: He is alert and oriented to person, place, and time.     ED Results and Treatments Labs (all labs  ordered are listed, but only abnormal results are displayed) Labs Reviewed - No data to display                                                                                                                       EKG  EKG Interpretation  Date/Time:    Ventricular Rate:    PR Interval:    QRS Duration:   QT Interval:    QTC Calculation:   R Axis:     Text Interpretation:        Radiology No results found.  Pertinent labs & imaging results that were available during my care of the patient were reviewed by me and considered in my medical  decision making (see chart for details).  Medications Ordered in ED Medications  albuterol (VENTOLIN HFA) 108 (90 Base) MCG/ACT inhaler 4 puff (has no administration in time range)  ipratropium (ATROVENT HFA) inhaler 1 puff (has no administration in time range)  ipratropium (ATROVENT HFA) inhaler 2 puff (2 puffs Inhalation Given 12/25/19 0120)  albuterol (VENTOLIN HFA) 108 (90 Base) MCG/ACT inhaler 6 puff (6 puffs Inhalation Given 12/25/19 0120)  methylPREDNISolone sodium succinate (SOLU-MEDROL) 125 mg/2 mL injection 125 mg (125 mg Intramuscular Given 12/25/19 0123)                                                                                                                                    Procedures .Critical Care Performed by: Fatima Blank, MD Authorized by: Fatima Blank, MD    CRITICAL CARE Performed by: Grayce Sessions Jahira Swiss Total critical care time: 35 minutes Critical care time was exclusive of separately billable procedures and treating other patients. Critical care was necessary to treat or prevent imminent or life-threatening deterioration. Critical care was time spent personally by me on the following activities: development of treatment plan with patient and/or surrogate as well as nursing, discussions with consultants, evaluation of patient's response to treatment, examination of patient, obtaining history from patient or surrogate, ordering and performing treatments and interventions, ordering and review of laboratory studies, ordering and review of radiographic studies, pulse oximetry and re-evaluation of patient's condition.    (including critical care time)  Medical Decision Making / ED Course I have reviewed the nursing notes for this encounter and the patient's prior records (if available in EHR or on provided paperwork).   Kyle Blair was evaluated in Emergency Department on  12/25/2019 for the symptoms described in the history of present  illness. He was evaluated in the context of the global COVID-19 pandemic, which necessitated consideration that the patient might be at risk for infection with the SARS-CoV-2 virus that causes COVID-19. Institutional protocols and algorithms that pertain to the evaluation of patients at risk for COVID-19 are in a state of rapid change based on information released by regulatory bodies including the CDC and federal and state organizations. These policies and algorithms were followed during the patient's care in the ED.  Asthma exacerbation Given total of 10 puffs albuterol and 3 puffs of atrovent. IM solumedrol given.  WOB and wheezing improved.   The patient appears reasonably screened and/or stabilized for discharge and I doubt any other medical condition or other [facility] requiring further screening, evaluation, or treatment in the ED at this time prior to discharge.  The patient is safe for discharge with strict return precautions.       Final Clinical Impression(s) / ED Diagnoses Final diagnoses:  Moderate persistent asthma with exacerbation     The patient appears reasonably screened and/or stabilized for discharge and I doubt any other medical condition or other [facility] requiring further screening, evaluation, or treatment in the ED at this time prior to discharge.  Disposition: Discharge  Condition: Good  I have discussed the results, Dx and Tx plan with the patient who expressed understanding and agree(s) with the plan. Discharge instructions discussed at great length. The patient was given strict return precautions who verbalized understanding of the instructions. No further questions at time of discharge.    ED Discharge Orders         Ordered    methylPREDNISolone (MEDROL DOSEPAK) 4 MG TBPK tablet     12/25/19 0335           Follow Up: [name], NP [place] [id number] [phone]  Schedule an appointment as soon as possible  for a visit  As needed     This chart was dictated using voice recognition software.  Despite best efforts to proofread,  errors can occur which can change the documentation meaning.     Fatima Blank, MD 12/25/19 [phone]

## 2019-12-25 NOTE — Progress Notes (Signed)
RN gave albuterol and atrovent treatment. 

## 2019-12-31 ENCOUNTER — Other Ambulatory Visit: Payer: Self-pay

## 2019-12-31 ENCOUNTER — Ambulatory Visit: Payer: Medicare HMO | Admitting: Internal Medicine

## 2019-12-31 ENCOUNTER — Encounter: Payer: Self-pay | Admitting: Internal Medicine

## 2019-12-31 VITALS — BP 130/72 | HR 97 | Temp 97.0°F | Ht 66.0 in | Wt 235.6 lb

## 2019-12-31 DIAGNOSIS — J339 Nasal polyp, unspecified: Secondary | ICD-10-CM

## 2019-12-31 DIAGNOSIS — F17210 Nicotine dependence, cigarettes, uncomplicated: Secondary | ICD-10-CM

## 2019-12-31 DIAGNOSIS — F17200 Nicotine dependence, unspecified, uncomplicated: Secondary | ICD-10-CM

## 2019-12-31 DIAGNOSIS — J45909 Unspecified asthma, uncomplicated: Secondary | ICD-10-CM | POA: Diagnosis not present

## 2019-12-31 DIAGNOSIS — J449 Chronic obstructive pulmonary disease, unspecified: Secondary | ICD-10-CM | POA: Diagnosis not present

## 2019-12-31 DIAGNOSIS — Z886 Allergy status to analgesic agent status: Secondary | ICD-10-CM | POA: Diagnosis not present

## 2019-12-31 DIAGNOSIS — Z720 Tobacco use: Secondary | ICD-10-CM

## 2019-12-31 DIAGNOSIS — R69 Illness, unspecified: Secondary | ICD-10-CM | POA: Diagnosis not present

## 2019-12-31 MED ORDER — VARENICLINE TARTRATE 0.5 MG X 11 & 1 MG X 42 PO MISC: ORAL | 1 refills | Status: DC

## 2019-12-31 MED ORDER — FLUTICASONE PROPIONATE 50 MCG/ACT NA SUSP: 1.0000 | Freq: Every day | NASAL | 2 refills | Status: DC

## 2019-12-31 NOTE — Patient Instructions (Addendum)
Keep taking singulair, advair with gargling, and albuterol as needed.   Varenicline - Varenicline (brand name: Chantix) is a prescription medication that works in the brain to reduce nicotine withdrawal symptoms and cigarette cravings. In several studies, it was more effective than placebo (a lookalike substitute that contains no medication.)  It should be taken after eating with a full glass of water as follows: ?One 0.5 mg tablet daily for three days ?One 0.5 mg tablet twice daily for the next four days ?One 1.0 mg tablet twice daily starting at day 7  You should plan to quit smoking between one and four weeks after starting varenicline.   You should continue it for 12 weeks before concluding if it is working; if you successfully quit at 12 weeks, you may continue taking it for an additional 12 weeks. If you have not quit after taking varenicline for 12 weeks, talk to your health care provider about the next step. Options include working harder to make behavioral changes and continuing varenicline or switching to another treatment.  Common side effects of varenicline include nausea and abnormal dreams.  In the very early years of Chantix there were some concerns about Chantix affecting mood and depression.  However, there has been lots of research on this and this is no longer a concern.  Chantix is considered safe to use even in patients taking medication for depression.   In 2011, the [place] Food and Drug Administration (FDA) issued an advisory that, in people who already have heart or blood vessel disease, varenicline may increase the risk of acute heart problems. If there is such a risk, it appears to be small, and is likely outweighed by the benefits of quitting smoking.   Flonase - 1 spray on each side of your nose twice a day for first week, then 1 spray on each side.   Instructions for use:  If you also use a saline nasal spray or rinse, use that first.  Position the head with the chin  slightly tucked. Use the right hand to spray into the left nostril and the right hand to spray into the left nostril.   Point the bottle away from the septum of your nose (cartilage that divides the two sides of your nose).   Hold the nostril closed on the opposite side from where you will spray  Spray once and gently sniff to pull the medicine into the higher parts of your nose.  Don't sniff too hard as the medicine will drain down the back of your throat instead.  Repeat with a second spray on the same side if prescribed.  Repeat on the other side of your nose.   What are the benefits of quitting smoking? Quitting smoking can lower your chances of getting or dying from heart disease, lung disease, kidney failure, infection, or cancer. It can also lower your chances of getting osteoporosis, a condition that makes your bones weak. Plus, quitting smoking can help your skin look younger and reduce the chances that you will have problems with sex.  Quitting smoking will improve your health no matter how old you are, and no matter how long or how much you have smoked.  What should I do if I want to quit smoking? The letters in the word "START" can help you remember the steps to take: S = Set a quit date. T = Tell family, friends, and the people around you that you plan to quit. A = Anticipate or plan ahead for  the tough times you'll face while quitting. R = Remove cigarettes and other tobacco products from your home, car, and work. T = Talk to your doctor about getting help to quit.  How can my doctor or nurse help? Your doctor or nurse can give you advice on the best way to quit. He or she can also put you in touch with counselors or other people you can call for support. Plus, your doctor or nurse can give you medicines to: ?Reduce your craving for cigarettes ?Reduce the unpleasant symptoms that happen when you stop smoking (called "withdrawal symptoms"). You can also get help from a free  phone line (1-800-QUIT-NOW) or go online to [employer].  What are the symptoms of withdrawal? The symptoms include: ?Trouble sleeping ?Being irritable, anxious or restless ?Getting frustrated or angry ?Having trouble thinking clearly  Some people who stop smoking become temporarily depressed. Some people need treatment for depression, such as counseling or antidepressant medicines. Depressed people might: ?No longer enjoy or care about doing the things they used to like to do ?Feel sad, down, hopeless, nervous, or cranky most of the day, almost every day ?Lose or gain weight ?Sleep too much or too little ?Feel tired or like they have no energy ?Feel guilty or like they are worth nothing ?Forget things or feel confused ?Move and speak more slowly than usual ?Act restless or have trouble staying still ?Think about death or suicide  If you think you might be depressed, see your doctor or nurse. Only someone trained in mental health can tell for sure if you are depressed. If you ever feel like you might hurt yourself, go straight to the nearest emergency department. Or you can call for an ambulance (in the [place] and [place], [place] 9-1-1) or call your doctor or nurse right away and tell them it is an emergency. You can also reach the [place] National Suicide Prevention Lifeline at (786)881-0317 or http://walker-sanchez.info/.  How do medicines help you stop smoking? Different medicines work in different ways: ?Nicotine replacement therapy eases withdrawal and reduces your body's craving for nicotine, the main drug found in cigarettes. There are different forms of nicotine replacement, including skin patches, lozenges, gum, nasal sprays, and "puffers" or inhalers. Many can be bought without a prescription, while others might require one. ?Bupropion is a prescription medicine that reduces your desire to smoke. This medicine is sold under the brand names Zyban and Wellbutrin. It is also  available in a generic version, which is cheaper than brand name medicines. ?Varenicline (brand names: Chantix, Champix) is a prescription medicine that reduces withdrawal symptoms and cigarette cravings. If you think you'd like to take varenicline and you have a history of depression, anxiety, or heart disease, discuss this with your doctor or nurse before taking the medicine. Varenicline can also increase the effects of alcohol in some people. It's a good idea to limit drinking while you're taking it, at least until you know how it affects you.  How does counseling work? Counseling can happen during formal office visits or just over the phone. A counselor can help you: ?Figure out what triggers your smoking and what to do instead ?Overcome cravings ?Figure out what went wrong when you tried to quit before  What works best? Studies show that people have the best luck at quitting if they take medicines to help them quit and work with a Social worker. It might also be helpful to combine nicotine replacement with one of the prescription medicines that help people  quit. In some cases, it might even make sense to take bupropion and varenicline together.  What about e-cigarettes? Sometimes people wonder if using electronic cigarettes, or "e-cigarettes," might help them quit smoking. Using e-cigarettes is also called "vaping." Doctors do not recommend e-cigarettes in place of medicines and counseling. That's because e-cigarettes still contain nicotine as well as other substances that might be harmful. It's not clear how they can affect a person's health in the long term.  Will I gain weight if I quit? Yes, you might gain a few pounds. But quitting smoking will have a much more positive effect on your health than weighing a few pounds more. Plus, you can help prevent some weight gain by being more active and eating less. Taking the medicine bupropion might help control weight gain.   What else can I do to  improve my chances of quitting? You can: ?Start exercising. ?Stay away from smokers and places that you associate with smoking. If people close to you smoke, ask them to quit with you. ?Keep gum, hard candy, or something to put in your mouth handy. If you get a craving for a cigarette, try one of these instead. ?Don't give up, even if you start smoking again. It takes most people a few tries before they succeed.  What if I am pregnant and I smoke? If you are pregnant, it's really important for the health of your baby that you quit. Ask your doctor what options you have, and what is safest for your baby

## 2019-12-31 NOTE — Progress Notes (Signed)
Kyle Blair    [id number]    May 04, 1961  Primary Care Physician:O'Sullivan, Lenna Sciara, NP  Referring Physician: [name], NP [address] [place],  [place] [address] Reason for Consultation: cough and shortness of breath Date of Consultation: 01/01/2020  Chief complaint:   Chief Complaint  Patient presents with   Consult    Patient has asthma. Patient states he has shortness of breath all the time. Patient states he is wheezing and has congestion. Patient has productive cough with brown sputum.     HPI: Recurrent pneumonia and respiratory infections as a child. Hospitalized at [facility]. Was on prednisone sometimes for a year at a time.    Hasn't seen a pulmonologist since 2006 when he was living in [place]. He had H1N1 as well a few years ago and was hospitalized at that time requiring BIPAP.   In the last ten years has had been doing fairly well but 2020 breathing has worsened and has recurrent asthma exacerbations.   Has had thrush when he wasn't gargling, but hasn't had it in 6 months.  Currently using albuterolnebulizer 4 times/day.   Also has nasal polyposis - last removed in 1990s, doesn't take budesonide.  Has had trouble with smelling for the last 2 years.  Current Regimen: Singulair, Albuterol (neb and MDI) - currently using 4 times/day, Advair 500/50 1 puff BID, benadryl every night.  Asthma Triggers: stress, cats Exacerbations in the last year: 6 in the last year History of hospitalization or intubation: Yes was intubated in the 1970s Hives: none Allergy Testing: Yes had that done as a child - underwent SCIT in the 1970s.  GERD: yes on Protonix  Allergic Rhinitis: ACT:  Asthma Control Test ACT Total Score  12/31/2019 6   FeNO: none  Quit successfully with chantix for 8 years. But picked it back up again.  He states that stress is a major trigger for his asthma, and he wonders now if his asthma has been more  poorly controlled since 2020 due to higher levels of stress.  Social history:  Occupation: used to work as Administrator. Had a Stroke and is now unable to work.  Exposures: Lives at home with son and wife.  Smoking history: yes   Social History   Occupational History   Occupation: Tour manager: UNEMPLOYED  Tobacco Use   Smoking status: Current Every Day Smoker    Packs/day: 0.50    Years: 20.00    Pack years: 10.00    Types: Cigarettes   Smokeless tobacco: Never Used  Substance and Sexual Activity   Alcohol use: No    Alcohol/week: 0.0 standard drinks   Drug use: No   Sexual activity: Not on file    Relevant family history:  Family History  Problem Relation Age of Onset   Heart disease Mother        pacemaker   Diabetes Father    Kidney disease Father    Diabetes Sister    Kidney disease Sister    Colon cancer Neg Hx    Esophageal cancer Neg Hx    Rectal cancer Neg Hx    Stomach cancer Neg Hx    Prostate cancer Neg Hx    Pancreatic cancer Neg Hx     Past Medical History:  Diagnosis Date   Arthritis    severe; pt takes strong pain meds daily/legs   Asthma    MONTH  AGO   BPH (benign prostatic hypertrophy)    Diverticulosis    GERD with stricture    Hiatal hernia    History of stroke 2004   Hyperlipidemia    Hypertension    Hypothyroid    Lung nodule 02/03/2012   Osteoarthritis of left knee    Polycystic kidney disease    Stroke ([place])    2004   EQULIBRIUM, AND JUDGING DISTANCE   Tubular adenoma of colon     Past Surgical History:  Procedure Laterality Date   CARPAL TUNNEL RELEASE     COLONOSCOPY     HERNIA REPAIR  2005   with mesh   KNEE ARTHROSCOPY Bilateral 12/05/13   cyst and bone spur removed from left knee per pt.   KNEE ARTHROSCOPY Left 01/2015   NASAL POLYP EXCISION  1992 or 93   POLYPECTOMY     TOTAL KNEE ARTHROPLASTY Right 05/19/2015   Procedure: RIGHT TOTAL KNEE ARTHROPLASTY;  Surgeon:  Paralee Cancel, MD;  Location: [facility];  Service: Orthopedics;  Laterality: Right;   TOTAL KNEE ARTHROPLASTY Left 11/19/2018   Procedure: LEFT  TOTAL KNEE ARTHROPLASTY;  Surgeon: Dorna Leitz, MD;  Location: [facility];  Service: Orthopedics;  Laterality: Left;     Review of systems: Review of Systems  Constitutional: Negative for chills and fever.  HENT: Positive for congestion, sinus pain and sore throat.   Eyes: Negative for discharge and redness.  Respiratory: Positive for cough, shortness of breath and wheezing. Negative for hemoptysis.   Cardiovascular: Negative for chest pain and palpitations.  Gastrointestinal: Positive for heartburn. Negative for nausea and vomiting.  Genitourinary: Negative.   Musculoskeletal: Negative for myalgias.  Skin: Negative for rash.  Neurological: Negative for focal weakness.  Endo/Heme/Allergies: Negative for environmental allergies.  Psychiatric/Behavioral: The patient is not nervous/anxious.   All other systems reviewed and are negative.   Physical Exam: Blood pressure 130/72, pulse 97, temperature (!) 97 F (36.1 C), temperature source Temporal, height 5\' 6"  (1.676 m), weight 235 lb 9.6 oz (106.9 kg), SpO2 96 %. Gen:      No acute distress Eyes: EOMI, sclera anicteric ENT:  no nasal polyps, mucus membranes moist, there is cobblestoning in the oropharynx as well as nasal erythema Neck:     Supple, no thyromegaly Lungs:    No increased respiratory effort, symmetric chest wall excursion, clear to auscultation bilaterally, no wheezes or crackles CV:         Regular rate and rhythm; no murmurs, rubs, or gallops.  No pedal edema Abd:      + bowel sounds; soft, non-tender; no distension MSK: no acute synovitis of DIP or PIP joints, no mechanics hands.  Skin:      Warm and dry; no rashes Neuro: normal speech, no focal facial asymmetry Psych: Anxious, visibly agitated.  Data Reviewed: Imaging: I have personally reviewed the notable chest x-rays done and  on record, most recently in May 2020.  Chest x-ray May 2020 shows no acute cardiopulmonary process on my evaluation.  PFTs: None on file.   Labs: Recent CBC is without peripheral eosinophilia Immunization status: Immunization History  Administered Date(s) Administered   Influenza Split 09/26/2011, 09/19/2012   Influenza,inj,Quad PF,6+ Mos 09/25/2013, 08/19/2014, 09/09/2015, 10/11/2016, 09/11/2019   Pneumococcal Conjugate-13 12/26/2008   Pneumococcal Polysaccharide-23 11/09/2016   Tdap 05/02/2014, 08/18/2017    Assessment:  Asthma COPD Overlap Syndrome Asthma with history of Intubation [place] with Nasal polyposis asthma and allergies and Nsaid induced respiratory disease Tobacco use disorder  Anxiety  Plan/Recommendations: Continue advair 1 puff BID.  Albuterol as needed.  He needs a full set of pulmonary function test which she has not had.  We will also get inflammatory markers in addition to FeNO, serum IgE and eosinophils.  We discussed her asthma management has changed dramatically in the last 15 years and he may be eligible for biologic agents which would greatly improve his symptom control.  I discussed with the patient my concerns that his stress and anxiety may be playing into his poor asthma management, as he himself suggested.  I thought it might be a good idea if he talked with his primary care provider Lemar Livings with whom he has a good relationship about this further.  At which point he promptly refused, saying that any flag of mental illness or health problems would interfere with his ability to can carry a concealed weapon.  I did suggest that his stress and anxiety could come to a degree that it would be life-threatening with regards to his asthma, and he says he does not want anything to interfere with his ability to carry his concealed weapon.  Towards the end of our visit the patient asked for prednisone.  I explained to him that because he is  completing full sentences, he has no audible wheezing, he is in not in respiratory distress and does not appear to be having an acute exacerbation, I do not think prednisone is indicated.  We discussed the risks and benefits as well as pros and cons of long-term glucocorticoid use.  He later left the encounter without receiving his paperwork or scheduling a follow-up appointment  I personally spent 310 minutes counseling the patient regarding tobacco use disorder.  Patient is symptomatic from tobacco use disorder due to the following condition: asthma.  The patient's response was contemplative.  We discussed nicotine replacement therapy, Wellbutrin, Chantix.  We identified to gather patient specific barriers to change.  The patient is open to future discussions about tobacco cessation.  He is going to try Chantix for smoking cessation which I have prescribed.   I spent 65 minutes on 01/01/2020 in care of this patient including face to face time and non-face to face time spent charting, review of outside records, and coordination of care.  Return to Care: Return in about 2 months (around 02/28/2020).  Lenice Llamas, MD [facility]  CC: [name], NP

## 2020-01-01 ENCOUNTER — Encounter: Payer: Self-pay | Admitting: Family

## 2020-01-01 ENCOUNTER — Ambulatory Visit (INDEPENDENT_AMBULATORY_CARE_PROVIDER_SITE_OTHER): Payer: Medicare HMO | Admitting: Family

## 2020-01-01 VITALS — Temp 98.2°F | Ht 66.0 in | Wt 228.0 lb

## 2020-01-01 DIAGNOSIS — J4540 Moderate persistent asthma, uncomplicated: Secondary | ICD-10-CM | POA: Diagnosis not present

## 2020-01-01 MED ORDER — PREDNISONE 10 MG PO TABS: ORAL_TABLET | ORAL | 0 refills | Status: DC

## 2020-01-01 NOTE — Progress Notes (Signed)
Virtual Visit via Telephone Note  I connected with Kyle Blair on 01/01/20 at 10:40 AM EST by telephone and verified that I am speaking with the correct person using two identifiers.  Location: Patient: home Provider: home   I discussed the limitations, risks, security and privacy concerns of performing an evaluation and management service by telephone and the availability of in person appointments. I also discussed with the patient that there may be a patient responsible charge related to this service. The patient expressed understanding and agreed to proceed.   History of Present Illness:  Patient is a 59 yr old male who presents today for follow up of his acute asthma exacerbation. I last saw the patient on 12/16/19. He was treated with azithromycin and prednisone taper.  A referral was placed to pulmonology.  He then proceeded to the ED on 12/25/19 due to worsening SOB. He was treated with albuterol, atrovent and IM solumedrol. A chest x-ray was not performed.  Symptoms improved and he was discharged home.   Reports that he is slightly better than he was but still having some sob and some wheezing. Saw pulmonology yesterday.  Will be going out of town next week and would like to have an rx for prednisone to have on hand in case symptoms worsen.    Past Medical History:  Diagnosis Date  . Arthritis    severe; pt takes strong pain meds daily/legs  . Asthma    MONTH AGO  . BPH (benign prostatic hypertrophy)   . Diverticulosis   . GERD with stricture   . Hiatal hernia   . History of stroke 2004  . Hyperlipidemia   . Hypertension   . Hypothyroid   . Lung nodule 02/03/2012  . Osteoarthritis of left knee   . Polycystic kidney disease   . Stroke ([place])    2004   EQULIBRIUM, AND JUDGING DISTANCE  . Tubular adenoma of colon      Social History   Socioeconomic History  . Marital status: Married    Spouse name: Not on file  . Number of children: 2  . Years of education: Not  on file  . Highest education level: Not on file  Occupational History  . Occupation: DISABLED    Employer: UNEMPLOYED  Tobacco Use  . Smoking status: Current Every Day Smoker    Packs/day: 0.50    Years: 20.00    Pack years: 10.00    Types: Cigarettes  . Smokeless tobacco: Never Used  Substance and Sexual Activity  . Alcohol use: No    Alcohol/week: 0.0 standard drinks  . Drug use: No  . Sexual activity: Not on file  Other Topics Concern  . Not on file  Social History Narrative   Caffeine use:  1 pot coffee daily, 1 glass tea   Regular exercise:  No. Has active lifestyle.   2 biological and 1 stepson.   Former smoker- quit in 2007   He is on disability- reports that he has a history of heat stroke.              Social Determinants of Health   Financial Resource Strain:   . Difficulty of Paying Living Expenses: Not on file  Food Insecurity:   . Worried About Charity fundraiser in the Last Year: Not on file  . Ran Out of Food in the Last Year: Not on file  Transportation Needs:   . Lack of Transportation (Medical): Not on file  .  Lack of Transportation (Non-Medical): Not on file  Physical Activity:   . Days of Exercise per Week: Not on file  . Minutes of Exercise per Session: Not on file  Stress:   . Feeling of Stress : Not on file  Social Connections:   . Frequency of Communication with Friends and Family: Not on file  . Frequency of Social Gatherings with Friends and Family: Not on file  . Attends Religious Services: Not on file  . Active Member of Clubs or Organizations: Not on file  . Attends Archivist Meetings: Not on file  . Marital Status: Not on file  Intimate Partner Violence:   . Fear of Current or Ex-Partner: Not on file  . Emotionally Abused: Not on file  . Physically Abused: Not on file  . Sexually Abused: Not on file    Past Surgical History:  Procedure Laterality Date  . CARPAL TUNNEL RELEASE    . COLONOSCOPY    . HERNIA REPAIR   2005   with mesh  . KNEE ARTHROSCOPY Bilateral 12/05/13   cyst and bone spur removed from left knee per pt.  Marland Kitchen KNEE ARTHROSCOPY Left 01/2015  . NASAL POLYP EXCISION  1992 or 93  . POLYPECTOMY    . TOTAL KNEE ARTHROPLASTY Right 05/19/2015   Procedure: RIGHT TOTAL KNEE ARTHROPLASTY;  Surgeon: Paralee Cancel, MD;  [facility];  Service: Orthopedics;  Laterality: Right;  . TOTAL KNEE ARTHROPLASTY Left 11/19/2018   Procedure: LEFT  TOTAL KNEE ARTHROPLASTY;  Surgeon: Dorna Leitz, MD;  Location: [facility];  Service: Orthopedics;  Laterality: Left;    Family History  Problem Relation Age of Onset  . Heart disease Mother        pacemaker  . Diabetes Father   . Kidney disease Father   . Diabetes Sister   . Kidney disease Sister   . Colon cancer Neg Hx   . Esophageal cancer Neg Hx   . Rectal cancer Neg Hx   . Stomach cancer Neg Hx   . Prostate cancer Neg Hx   . Pancreatic cancer Neg Hx     Allergies  Allergen Reactions  . Aspirin Anaphylaxis  . Doxycycline Hyclate Swelling    Facial Swelling  . Ibuprofen Anaphylaxis  . Jynarque [Tolvaptan] Anaphylaxis  . Peanut Butter Flavor Anaphylaxis  . Cephalexin Rash    Current Outpatient Medications on File Prior to Visit  Medication Sig Dispense Refill  . acetaminophen (TYLENOL) 325 MG tablet Take 1-2 tablets (325-650 mg total) by mouth every 6 (six) hours as needed for mild pain or moderate pain. 30 tablet 0  . ADVAIR DISKUS 500-50 MCG/DOSE AEPB USE ONE INHALATION TWICE A DAY. 60 each 3  . albuterol (PROVENTIL) (2.5 MG/3ML) 0.083% nebulizer solution USE 1 VIAL VIA NEBULIZER EVERY 6 HOURS AS NEEDED FOR WHEEZING OR SHORTNESS OF BREATH 75 mL 12  . amLODipine (NORVASC) 10 MG tablet Take 1 tablet (10 mg total) by mouth daily. 30 tablet 5  . apixaban (ELIQUIS) 2.5 MG TABS tablet Take 1 tablet (2.5 mg total) by mouth 2 (two) times daily. 42 tablet 0  . benazepril (LOTENSIN) 20 MG tablet Take 1 tablet (20 mg total) by mouth daily. 30 tablet 5  .  clotrimazole-betamethasone (LOTRISONE) cream Apply 1 application topically 2 (two) times daily. 30 g 0  . furosemide (LASIX) 20 MG tablet Take 1 tablet (20 mg total) by mouth daily as needed. 30 tablet 5  . gabapentin (NEURONTIN) 300 MG capsule Take 1  capsule (300 mg total) by mouth 3 (three) times daily. 90 capsule 5  . levothyroxine (SYNTHROID) 200 MCG tablet TAKE ONE TABLET BY MOUTH EVERY MORNING BEFORE BREAKFAST 90 tablet 1  . lovastatin (MEVACOR) 40 MG tablet Take 1 tablet (40 mg total) by mouth at bedtime. 90 tablet 1  . methocarbamol (ROBAXIN) 500 MG tablet Take 1 tablet (500 mg total) by mouth 2 (two) times daily. 60 tablet 5  . montelukast (SINGULAIR) 10 MG tablet TAKE ONE TABLET EVERY MORNING 90 tablet 1  . pantoprazole (PROTONIX) 40 MG tablet Take 1 tablet (40 mg total) by mouth daily. MUST HAVE OFFICE VISIT FOR FURTHER REFILLS 90 tablet 0  . rOPINIRole (REQUIP) 0.5 MG tablet Take 1 tablet (0.5 mg total) by mouth at bedtime. 30 tablet 5  . sildenafil (REVATIO) 20 MG tablet Take 1 to 2 tablets by mouth 30 minutes prior to sexual activity. 25 tablet 5  . tamsulosin (FLOMAX) 0.4 MG CAPS capsule TAKE ONE CAPSULE BY MOUTH DAILY 90 capsule 1  . varenicline (CHANTIX PAK) 0.5 MG X 11 & 1 MG X 42 tablet Take one 0.5 mg tablet by mouth once daily for 3 days, then increase to one 0.5 mg tablet twice daily for 4 days, then increase to one 1 mg tablet twice daily. 53 tablet 1  . VENTOLIN HFA 108 (90 Base) MCG/ACT inhaler INHALE 2 PUFFS INTO THE LUNGS EVERY 4 HOURS AS NEEDED FOR WHEEZING OR SHORTNESS OF BREATH 18 g 5  . docusate sodium (COLACE) 100 MG capsule Take 1 capsule (100 mg total) by mouth daily as needed for mild constipation. (Patient not taking: Reported on 12/31/2019) 60 capsule 3  . fluticasone (FLONASE) 50 MCG/ACT nasal spray Place 1 spray into both nostrils daily. 16 g 2   No current facility-administered medications on file prior to visit.    Temp 98.2 F (36.8 C) (Oral) Comment: per  pt  Ht 5\' 6"  (1.676 m)   Wt 228 lb (103.4 kg) Comment: per pt  BMI 36.80 kg/m    Observations/Objective:  Gen: Awake, alert, no acute distress Resp: Breathing sounds even and non-labored Psych: calm/pleasant demeanor Neuro: Alert and Oriented x 3, speech is clear.   Assessment and Plan:  Asthma- uncontrolled. Discussed risks of frequent steroid use.  Advised pt that I would given him an rx to bring with him but he is only to use if symptoms worsen. We discussed the importance of quitting smoking and the contribution of smoking in his worsening symptoms.   15 minutes spent on today's visit.  Follow Up Instructions:    I discussed the assessment and treatment plan with the patient. The patient was provided an opportunity to ask questions and all were answered. The patient agreed with the plan and demonstrated an understanding of the instructions.   The patient was advised to call back or seek an in-person evaluation if the symptoms worsen or if the condition fails to improve as anticipated.    Nance Pear, NP

## 2020-01-02 ENCOUNTER — Telehealth: Payer: Self-pay

## 2020-01-02 NOTE — Telephone Encounter (Signed)
PA request received from: [facility] requested: Chantix CMM Key: [id number] PA request has been sent to plan, and a determination is expected within 1-3 business days.   Routing to [place] for follow-up.

## 2020-01-06 NOTE — Telephone Encounter (Signed)
Per CMM, PA has been approved until 06/30/20. Pharmacy is aware of approval.

## 2020-01-14 ENCOUNTER — Telehealth: Payer: Self-pay

## 2020-01-14 ENCOUNTER — Ambulatory Visit (INDEPENDENT_AMBULATORY_CARE_PROVIDER_SITE_OTHER): Payer: Medicare HMO | Admitting: Medical

## 2020-01-14 ENCOUNTER — Telehealth: Payer: Self-pay | Admitting: Family

## 2020-01-14 ENCOUNTER — Encounter: Payer: Self-pay | Admitting: Medical

## 2020-01-14 VITALS — Ht 66.0 in | Wt 220.0 lb

## 2020-01-14 DIAGNOSIS — J209 Acute bronchitis, unspecified: Secondary | ICD-10-CM

## 2020-01-14 DIAGNOSIS — R062 Wheezing: Secondary | ICD-10-CM

## 2020-01-14 DIAGNOSIS — U071 COVID-19: Secondary | ICD-10-CM | POA: Diagnosis not present

## 2020-01-14 MED ORDER — ATROVENT HFA 17 MCG/ACT IN AERS: 2.0000 | INHALATION_SPRAY | Freq: Four times a day (QID) | RESPIRATORY_TRACT | 0 refills | Status: DC | PRN

## 2020-01-14 MED ORDER — AZITHROMYCIN 250 MG PO TABS: ORAL_TABLET | ORAL | 0 refills | Status: DC

## 2020-01-14 MED ORDER — PREDNISONE 10 MG (21) PO TBPK: ORAL_TABLET | ORAL | 0 refills | Status: DC

## 2020-01-14 MED FILL — AZITHROMYCIN 250 MG TABLET: 250 | 5 days supply | Qty: 6 | Fill #0

## 2020-01-14 MED FILL — ATROVENT HFA INHALER: 17 | 25 days supply | Qty: 13 | Fill #0

## 2020-01-14 MED FILL — predniSONE 10 MG TABS: 10 | 6 days supply | Qty: 21 | Fill #0

## 2020-01-14 NOTE — Progress Notes (Signed)
Subjective:    Patient ID: Kyle Blair, male    DOB: 05/11/1961, 59 y.o.   MRN: [id number]  HPI Virtual Visit via Telephone Note  I connected with Kyle Blair on 01/14/20 at  2:40 PM EST by telephone and verified that I am speaking with the correct person using two identifiers.  Location: Patient: home Provider: office   I discussed the limitations, risks, security and privacy concerns of performing an evaluation and management service by telephone and the availability of in person appointments. I also discussed with the patient that there may be a patient responsible charge related to this service. The patient expressed understanding and agreed to proceed.   History of Present Illness:(called pt various times at 3 am) Tried my cell via doximity and land line. No answer)  Pt has talked with medical assistant Tiffany and he explained asthma, covid exposure and struggle to breath. Sign/symptoms for one week and not getting better. Told staff lungs feel tight.  Tiffany asked me opinion. I advised ED based on covid exposure, hx of asthma and described signs/symptoms. In addition no 02 sat available and no vitals.   He refused to go to ED. So I called pt to do visit although very challenging circumstances.(pt explains that he went to emergency dept and was frustrated with care)  Pt states also some gurgling sounds in lungs. Sinus pressure and ha. Pt states baseline has very poor sense of smell.    Pt finished taper dose of prednisone last week. Pt also states atrovent that ED helped him.      Observations/Objective: General- no acute distress, sounds congested, alert, oriented. Normal speech. Pleasant but expresses frustration with illness.  Assessment and Plan: Pt has hx of asthma with severe type signs and symptoms. Did not want to go to ED. So I did call him and made effort to treat out patient with no ED evaluation. Concern for covid with possible lung infection and  exacerbation of asthma.  Rx azithromycin antibiotic, atrovent inhaler, and 6 day taper prednisone.  Advised get 02 sat monitor and bp cuff at pharmacy. Contact us by my chart and give update on bp, pulse and 02 sat. Pt aware of dangerous 02 sat numbers and values that would indicate need for ED evaluation.  Pt agrees he would go to ED if not improving.  Follow up 7 days virtual visit or as needed.  Ask staff to call back pt and give number to call to get scheduled for covid test.  30 minute spent with pt. 50% of time spent counseling on plan going forward and answering questions.  Follow Up Instructions:    I discussed the assessment and treatment plan with the patient. The patient was provided an opportunity to ask questions and all were answered. The patient agreed with the plan and demonstrated an understanding of the instructions.   The patient was advised to call back or seek an in-person evaluation if the symptoms worsen or if the condition fails to improve as anticipated.  I provided 30 minutes of non-face-to-face time during this encounter.   Mackie Pai, PA-C    Review of Systems  Constitutional: Negative for chills, fatigue and fever.  HENT: Positive for congestion.   Respiratory: Positive for cough, shortness of breath and wheezing.   Cardiovascular: Negative for chest pain and palpitations.  Gastrointestinal: Negative for abdominal distention and blood in stool.  Musculoskeletal: Negative for back pain and myalgias.  Skin: Negative for rash.  Neurological:  Positive for headaches. Negative for dizziness.  Hematological: Negative for adenopathy.  Psychiatric/Behavioral: Negative for behavioral problems and confusion.       Objective:   Physical Exam        Assessment & Plan:

## 2020-01-14 NOTE — Telephone Encounter (Signed)
Let's see if we can get him into the respiratory clinic today for exam and covid testing.

## 2020-01-14 NOTE — Telephone Encounter (Signed)
Patient scheduled to see Johnnette Gourd. Today as virtual telephone visit

## 2020-01-14 NOTE — Telephone Encounter (Signed)
Copied from [place] [id number]. Topic: General - Other >> Jan 14, 2020 11:27 AM Parke Poisson wrote: Reason for CRM: Pt is requesting a call back from office. He states he has asthma and is having some shortness of breath.His wife was just diagnosed with covid and he is requesting that the drug that was prescribed to Trump be prescribed to him. He did not want to speak to one of the nurses

## 2020-01-14 NOTE — Patient Instructions (Signed)
Pt has hx of asthma with severe type signs and symptoms. Did not want to go to ED. So I did call him and made effort to treat out patient with no ED evaluation. Concern for covid with possible lung infection and exacerbation of asthma.  Rx azithromycin antibiotic, atrovent inhaler, and 6 day taper prednisone.  Advised get 02 sat monitor and bp cuff at pharmacy. Contact us by my chart and give update on bp, pulse and 02 sat. Pt aware of dangerous 02 sat numbers and values that would indicate need for ED evaluation.  Pt agrees he would go to ED if not improving.  Follow up 7 days virtual visit or as needed.  Ask staff to call back pt and give number to call to get scheduled for covid test.

## 2020-01-15 NOTE — Telephone Encounter (Signed)
Patient advised how to go in the [facility] website to schedule testing

## 2020-01-15 NOTE — Telephone Encounter (Signed)
Opened in error

## 2020-01-15 NOTE — Telephone Encounter (Signed)
Caller Name: self Phone: [phone]  Pt stated he was on hold for [facility] for COVID-19 testing for over 40 minutes. He said last night he and his son did a test and could not smell anything (gasoline, bleach, etc). He is going to assume he has it and just stay home.   Could pt be set up at the [facility] resp clinic?

## 2020-01-16 ENCOUNTER — Other Ambulatory Visit: Payer: Self-pay

## 2020-01-16 ENCOUNTER — Ambulatory Visit: Payer: Medicare HMO | Attending: Family

## 2020-01-16 DIAGNOSIS — Z20822 Contact with and (suspected) exposure to covid-19: Secondary | ICD-10-CM

## 2020-01-17 ENCOUNTER — Telehealth: Payer: Self-pay

## 2020-01-17 LAB — NOVEL CORONAVIRUS, NAA: SARS-CoV-2, NAA: DETECTED — AB

## 2020-01-17 NOTE — Telephone Encounter (Signed)
Methocarbamol not covered. Preferred alternatives: cyclobenzaprine 5mg , 10mg .

## 2020-01-18 ENCOUNTER — Telehealth: Payer: Self-pay | Admitting: Nurse Practitioner

## 2020-01-18 ENCOUNTER — Other Ambulatory Visit: Payer: Self-pay | Admitting: Nurse Practitioner

## 2020-01-18 DIAGNOSIS — U071 COVID-19: Secondary | ICD-10-CM

## 2020-01-18 DIAGNOSIS — Q613 Polycystic kidney, unspecified: Secondary | ICD-10-CM

## 2020-01-18 DIAGNOSIS — J45901 Unspecified asthma with (acute) exacerbation: Secondary | ICD-10-CM

## 2020-01-18 NOTE — Progress Notes (Signed)
  I connected by phone with Kyle Blair on 01/18/2020 at 5:57 PM to discuss the potential use of an new treatment for mild to moderate COVID-19 viral infection in non-hospitalized patients.  This patient is a 59 y.o. male that meets the FDA criteria for Emergency Use Authorization of bamlanivimab or casirivimab\imdevimab.  Has a (+) direct SARS-CoV-2 viral test result  Has mild or moderate COVID-19   Is ? 59 years of age and weighs ? 40 kg  Is NOT hospitalized due to COVID-19  Is NOT requiring oxygen therapy or requiring an increase in baseline oxygen flow rate due to COVID-19  Is within 10 days of symptom onset  Has at least one of the high risk factor(s) for progression to severe COVID-19 and/or hospitalization as defined in EUA.  Specific high risk criteria : Chronic Kidney Disease (CKD), hypertension, severe asthma.    I have spoken and communicated the following to the patient or parent/caregiver:  1. FDA has authorized the emergency use of bamlanivimab and casirivimab\imdevimab for the treatment of mild to moderate COVID-19 in adults and pediatric patients with positive results of direct SARS-CoV-2 viral testing who are 61 years of age and older weighing at least 40 kg, and who are at high risk for progressing to severe COVID-19 and/or hospitalization.  2. The significant known and potential risks and benefits of bamlanivimab and casirivimab\imdevimab, and the extent to which such potential risks and benefits are unknown.  3. Information on available alternative treatments and the risks and benefits of those alternatives, including clinical trials.  4. Patients treated with bamlanivimab and casirivimab\imdevimab should continue to self-isolate and use infection control measures (e.g., wear mask, isolate, social distance, avoid sharing personal items, clean and disinfect "high touch" surfaces, and frequent handwashing) according to CDC guidelines.   5. The patient or  parent/caregiver has the option to accept or refuse bamlanivimab or casirivimab\imdevimab .  After reviewing this information with the patient, The patient agreed to proceed with receiving the bamlanimivab infusion and will be provided a copy of the Fact sheet prior to receiving the infusion.   Kyle Blair 01/18/2020 5:57 PM

## 2020-01-18 NOTE — Telephone Encounter (Signed)
Called to discuss with Orland Jarred about Covid symptoms and the use of bamlanivimab, a monoclonal antibody infusion for those with mild to moderate Covid symptoms and at a high risk of hospitalization.     Pt is qualified for this infusion at the [facility] infusion center due to co-morbid conditions (CKD, hypertension, severe asthma) and/or a member of an at-risk group.   Symptoms tier reviewed as well as criteria for ending isolation.  Symptoms reviewed that would warrant ED/Hospital evaluation. Preventative practices reviewed. Patient verbalized understanding. Patient advised to go to Urgent care or ED with severe symptoms.  Patient is requesting to receive infusion.   Patient has been scheduled for infusion as requested on 01/22/20 @ 1230. Patient verbalized understanding of treatment and appointment details.    Patient Active Problem List   Diagnosis Date Noted  . Primary osteoarthritis of left knee 11/19/2018  . OSA (obstructive sleep apnea) 06/02/2018  . 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 Ingrown toenail 05/08/2018  . Onychomycosis 05/08/2018  . 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 Acute metabolic encephalopathy 0000000  . Tobacco abuse 01/03/2018  . Acute respiratory failure ([place]) 01/19/2017  . Community acquired pneumonia 01/18/2017  . Influenza with pneumonia 01/18/2017  . Indeterminate pulmonary nodules 07/23/2015  . Obese 05/20/2015  . Undiagnosed cardiac murmurs 04/15/2015  . Asthma with acute exacerbation 03/13/2014  . Hx of adenomatous colonic polyps 05/08/2013  . Barrett's esophagus 05/03/2013  . Chronic diarrhea 04/02/2013  . HTN (hypertension) 04/02/2013  . Hx of low back pain 09/26/2012  . Arthralgia of right knee 05/02/2012  . Asthma 12/13/2011  . Benign prostatic hyperplasia 12/13/2011  . Hyperlipidemia 12/13/2011  . GERD (gastroesophageal reflux disease) 12/13/2011  . Hypothyroid   . Polycystic kidney disease     Alda Lea,  AGPCNP-BC Pager: [phone] Amion: N. Cousar

## 2020-01-20 ENCOUNTER — Emergency Department (HOSPITAL_BASED_OUTPATIENT_CLINIC_OR_DEPARTMENT_OTHER): Payer: Medicare HMO

## 2020-01-20 ENCOUNTER — Emergency Department (HOSPITAL_BASED_OUTPATIENT_CLINIC_OR_DEPARTMENT_OTHER)
Admission: EM | Admit: 2020-01-20 | Discharge: 2020-01-20 | Disposition: A | Discharge status: Home / Self Care | Payer: Medicare HMO | Attending: Emergency Medicine | Admitting: Emergency Medicine

## 2020-01-20 ENCOUNTER — Other Ambulatory Visit: Payer: Self-pay

## 2020-01-20 ENCOUNTER — Telehealth: Payer: Self-pay

## 2020-01-20 ENCOUNTER — Encounter (HOSPITAL_BASED_OUTPATIENT_CLINIC_OR_DEPARTMENT_OTHER): Payer: Self-pay | Admitting: *Deleted

## 2020-01-20 DIAGNOSIS — Z79899 Other long term (current) drug therapy: Secondary | ICD-10-CM | POA: Insufficient documentation

## 2020-01-20 DIAGNOSIS — U071 COVID-19: Secondary | ICD-10-CM | POA: Diagnosis not present

## 2020-01-20 DIAGNOSIS — Z881 Allergy status to other antibiotic agents status: Secondary | ICD-10-CM | POA: Diagnosis not present

## 2020-01-20 DIAGNOSIS — E785 Hyperlipidemia, unspecified: Secondary | ICD-10-CM | POA: Diagnosis not present

## 2020-01-20 DIAGNOSIS — Z91010 Allergy to peanuts: Secondary | ICD-10-CM | POA: Diagnosis not present

## 2020-01-20 DIAGNOSIS — Z8673 Personal history of transient ischemic attack (TIA), and cerebral infarction without residual deficits: Secondary | ICD-10-CM | POA: Diagnosis not present

## 2020-01-20 DIAGNOSIS — Z7901 Long term (current) use of anticoagulants: Secondary | ICD-10-CM | POA: Insufficient documentation

## 2020-01-20 DIAGNOSIS — I10 Essential (primary) hypertension: Secondary | ICD-10-CM | POA: Insufficient documentation

## 2020-01-20 DIAGNOSIS — Z888 Allergy status to other drugs, medicaments and biological substances status: Secondary | ICD-10-CM | POA: Diagnosis not present

## 2020-01-20 DIAGNOSIS — J45901 Unspecified asthma with (acute) exacerbation: Secondary | ICD-10-CM | POA: Insufficient documentation

## 2020-01-20 DIAGNOSIS — R0602 Shortness of breath: Secondary | ICD-10-CM | POA: Diagnosis not present

## 2020-01-20 DIAGNOSIS — E039 Hypothyroidism, unspecified: Secondary | ICD-10-CM | POA: Diagnosis not present

## 2020-01-20 DIAGNOSIS — Z885 Allergy status to narcotic agent status: Secondary | ICD-10-CM | POA: Diagnosis not present

## 2020-01-20 LAB — POCT I-STAT EG7
Acid-Base Excess: 2 mmol/L (ref 0.0–2.0)
Bicarbonate: 27.2 mmol/L (ref 20.0–28.0)
Calcium, Ion: 1.2 mmol/L (ref 1.15–1.40)
HCT: 51 % (ref 39.0–52.0)
Hemoglobin: 17.3 g/dL — ABNORMAL HIGH (ref 13.0–17.0)
O2 Saturation: 73 %
Patient temperature: 98.7
Potassium: 3.7 mmol/L (ref 3.5–5.1)
Sodium: 139 mmol/L (ref 135–145)
TCO2: 28 mmol/L (ref 22–32)
pCO2, Ven: 42.5 mmHg — ABNORMAL LOW (ref 44.0–60.0)
pH, Ven: 7.415 (ref 7.250–7.430)
pO2, Ven: 38 mmHg (ref 32.0–45.0)

## 2020-01-20 LAB — CBC WITH DIFFERENTIAL/PLATELET
Abs Immature Granulocytes: 0.05 10*3/uL (ref 0.00–0.07)
Basophils Absolute: 0 10*3/uL (ref 0.0–0.1)
Basophils Relative: 0 %
Eosinophils Absolute: 0 10*3/uL (ref 0.0–0.5)
Eosinophils Relative: 0 %
HCT: 51.4 % (ref 39.0–52.0)
Hemoglobin: 17.4 g/dL — ABNORMAL HIGH (ref 13.0–17.0)
Immature Granulocytes: 1 %
Lymphocytes Relative: 23 %
Lymphs Abs: 1.4 10*3/uL (ref 0.7–4.0)
MCH: 31.1 pg (ref 26.0–34.0)
MCHC: 33.9 g/dL (ref 30.0–36.0)
MCV: 91.9 fL (ref 80.0–100.0)
Monocytes Absolute: 0.5 10*3/uL (ref 0.1–1.0)
Monocytes Relative: 8 %
Neutro Abs: 4.2 10*3/uL (ref 1.7–7.7)
Neutrophils Relative %: 68 %
Platelets: 163 10*3/uL (ref 150–400)
RBC: 5.59 MIL/uL (ref 4.22–5.81)
RDW: 13 % (ref 11.5–15.5)
WBC: 6.2 10*3/uL (ref 4.0–10.5)
nRBC: 0 % (ref 0.0–0.2)

## 2020-01-20 LAB — BASIC METABOLIC PANEL
Anion gap: 9 (ref 5–15)
BUN: 13 mg/dL (ref 6–20)
CO2: 25 mmol/L (ref 22–32)
Calcium: 9 mg/dL (ref 8.9–10.3)
Chloride: 103 mmol/L (ref 98–111)
Creatinine, Ser: 0.8 mg/dL (ref 0.61–1.24)
GFR calc Af Amer: >60 mL/min (ref >60)
GFR calc non Af Amer: >60 mL/min (ref >60)
Glucose, Bld: 101 mg/dL — ABNORMAL HIGH (ref 70–99)
Potassium: 3.7 mmol/L (ref 3.5–5.1)
Sodium: 137 mmol/L (ref 135–145)

## 2020-01-20 MED ORDER — ALBUTEROL SULFATE HFA 108 (90 BASE) MCG/ACT IN AERS
4.0000 | INHALATION_SPRAY | Freq: Once | RESPIRATORY_TRACT | Status: AC
  Administered 2020-01-20: 4 via RESPIRATORY_TRACT

## 2020-01-20 MED ORDER — ALBUTEROL SULFATE HFA 108 (90 BASE) MCG/ACT IN AERS
INHALATION_SPRAY | RESPIRATORY_TRACT | Status: AC
  Administered 2020-01-20: 17:00:00 8
  Filled 2020-01-20: qty 6.7

## 2020-01-20 MED ORDER — MAGNESIUM SULFATE 2 GM/50ML IV SOLN
2.0000 g | Freq: Once | INTRAVENOUS | Status: AC
  Administered 2020-01-20: 2 g via INTRAVENOUS
  Filled 2020-01-20: qty 50

## 2020-01-20 MED ORDER — IPRATROPIUM BROMIDE HFA 17 MCG/ACT IN AERS
INHALATION_SPRAY | RESPIRATORY_TRACT | Status: AC
  Administered 2020-01-20: 17:00:00 2
  Filled 2020-01-20: qty 12.9

## 2020-01-20 MED ORDER — PREDNISONE 10 MG PO TABS: ORAL_TABLET | ORAL | 0 refills | Status: AC

## 2020-01-20 MED ORDER — CYCLOBENZAPRINE HCL 5 MG PO TABS: 5.0000 mg | ORAL_TABLET | Freq: Two times a day (BID) | ORAL | 2 refills | Status: DC | PRN

## 2020-01-20 MED ORDER — METHYLPREDNISOLONE SODIUM SUCC 125 MG IJ SOLR
125.0000 mg | Freq: Once | INTRAMUSCULAR | Status: AC
  Administered 2020-01-20: 125 mg via INTRAVENOUS
  Filled 2020-01-20: qty 2

## 2020-01-20 NOTE — Telephone Encounter (Signed)
Patient advised of rx.

## 2020-01-20 NOTE — Discharge Instructions (Addendum)
You are having a flare up of your asthma.  For the next 2 days, you should give yourself albuterol nebulizer breathing treatments every 4 hours.  Afterwards you can use your machine only as needed.  I also prescribed a longer course of steroids (15 days).  You received your dose for today (1/25).  Start taking these tomorrow.  The script was sent to the [facility].  Your family can pick it up in the morning.  If your breathing gets worse, you can always return to the ER.

## 2020-01-20 NOTE — Telephone Encounter (Signed)
Noted, I recommend that he go to Urgent care or the ED since his symptoms are not improving.

## 2020-01-20 NOTE — Telephone Encounter (Signed)
Patient advised to seek in person care today at urgent care or ed. He understands and agrees with plan.   He will also need a new nebulizer (has had the same one for over 10 years).

## 2020-01-20 NOTE — ED Notes (Signed)
ED Provider at bedside. 

## 2020-01-20 NOTE — Telephone Encounter (Signed)
Patient reports he is having trouble with his asthma now that he has covid19. "is getting harder to breath".  He reported a nurse from the [facility] covid center called and gave him and his wife an appointment to go in on Wednesday to have IV antibiotics.

## 2020-01-20 NOTE — ED Provider Notes (Signed)
[facility] EMERGENCY DEPARTMENT Provider Note   CSN: [id number] Arrival date & time: 01/20/20  1625     History Chief Complaint  Patient presents with  . Shortness of Breath  . Covid Positive    Kyle Blair is a 59 y.o. male history of asthma/COPD and recent Covid diagnosis presenting to emergency department wheezing shortness of breath.  Patient reports that he has had progressively worsening shortness of breath for the past several days.  He has been giving himself albuterol nebulizer treatments every 6 hours, most recently at 1 PM today.  However his family made him come to the emergency department because he felt his breathing was significantly worsening.  He is scheduled for his first outpatient antibody infusion in 2 days on Wednesday at [facility].  His last course of steroids was at the end of December after coming to the ED with an asthma exacerbation at that time.  He was diagnosed with Covid on January 21st, 2021.  HPI     Past Medical History:  Diagnosis Date  . Arthritis    severe; pt takes strong pain meds daily/legs  . Asthma    MONTH AGO  . BPH (benign prostatic hypertrophy)   . Diverticulosis   . GERD with stricture   . Hiatal hernia   . History of stroke 2004  . Hyperlipidemia   . Hypertension   . Hypothyroid   . Lung nodule 02/03/2012  . Osteoarthritis of left knee   . Polycystic kidney disease   . Stroke ([place])    2004   EQULIBRIUM, AND JUDGING DISTANCE  . Tubular adenoma of colon     Patient Active Problem List   Diagnosis Date Noted  . Primary osteoarthritis of left knee 11/19/2018  . OSA (obstructive sleep apnea) 06/02/2018  . 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 Ingrown toenail 05/08/2018  . Onychomycosis 05/08/2018  . 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 Acute metabolic encephalopathy 0000000  . Tobacco abuse 01/03/2018  . Acute respiratory failure ([place]) 01/19/2017  . Community acquired pneumonia 01/18/2017  .  Influenza with pneumonia 01/18/2017  . Indeterminate pulmonary nodules 07/23/2015  . Obese 05/20/2015  . Undiagnosed cardiac murmurs 04/15/2015  . Asthma with acute exacerbation 03/13/2014  . Hx of adenomatous colonic polyps 05/08/2013  . Barrett's esophagus 05/03/2013  . Chronic diarrhea 04/02/2013  . HTN (hypertension) 04/02/2013  . Hx of low back pain 09/26/2012  . Arthralgia of right knee 05/02/2012  . Asthma 12/13/2011  . Benign prostatic hyperplasia 12/13/2011  . Hyperlipidemia 12/13/2011  . GERD (gastroesophageal reflux disease) 12/13/2011  . Hypothyroid   . Polycystic kidney disease     Past Surgical History:  Procedure Laterality Date  . CARPAL TUNNEL RELEASE    . COLONOSCOPY    . HERNIA REPAIR  2005   with mesh  . KNEE ARTHROSCOPY Bilateral 12/05/13   cyst and bone spur removed from left knee per pt.  Marland Kitchen KNEE ARTHROSCOPY Left 01/2015  . NASAL POLYP EXCISION  1992 or 93  . POLYPECTOMY    . TOTAL KNEE ARTHROPLASTY Right 05/19/2015   Procedure: RIGHT TOTAL KNEE ARTHROPLASTY;  Surgeon: Paralee Cancel, MD;  Location: [facility];  Service: Orthopedics;  Laterality: Right;  . TOTAL KNEE ARTHROPLASTY Left 11/19/2018   Procedure: LEFT  TOTAL KNEE ARTHROPLASTY;  Surgeon: Dorna Leitz, MD;  Location: [facility];  Service: Orthopedics;  Laterality: Left;       Family History  Problem Relation Age of Onset  . Heart disease Mother  pacemaker  . Diabetes Father   . Kidney disease Father   . Diabetes Sister   . Kidney disease Sister   . Colon cancer Neg Hx   . Esophageal cancer Neg Hx   . Rectal cancer Neg Hx   . Stomach cancer Neg Hx   . Prostate cancer Neg Hx   . Pancreatic cancer Neg Hx     Social History   Tobacco Use  . Smoking status: Current Every Day Smoker    Packs/day: 0.50    Years: 20.00    Pack years: 10.00    Types: Cigarettes  . Smokeless tobacco: Never Used  Substance Use Topics  . Alcohol use: No    Alcohol/week: 0.0 standard drinks  . Drug use: No     Home Medications Prior to Admission medications   Medication Sig Start Date End Date Taking? Authorizing Provider  acetaminophen (TYLENOL) 325 MG tablet Take 1-2 tablets (325-650 mg total) by mouth every 6 (six) hours as needed for mild pain or moderate pain. 08/18/17   Domenic Moras, PA-C  ADVAIR DISKUS 500-50 MCG/DOSE AEPB USE ONE INHALATION TWICE A DAY. 11/12/19   [name], NP  albuterol (PROVENTIL) (2.5 MG/3ML) 0.083% nebulizer solution USE 1 VIAL VIA NEBULIZER EVERY 6 HOURS AS NEEDED FOR WHEEZING OR SHORTNESS OF BREATH 11/12/19   [name], NP  amLODipine (NORVASC) 10 MG tablet Take 1 tablet (10 mg total) by mouth daily. 10/30/19   [name], NP  apixaban (ELIQUIS) 2.5 MG TABS tablet Take 1 tablet (2.5 mg total) by mouth 2 (two) times daily. 11/19/18   Gary Fleet, PA-C  benazepril (LOTENSIN) 20 MG tablet Take 1 tablet (20 mg total) by mouth daily. 10/08/19   [name], NP  clotrimazole-betamethasone (LOTRISONE) cream Apply 1 application topically 2 (two) times daily. 06/11/19   [name], NP  cyclobenzaprine (FLEXERIL) 5 MG tablet Take 1 tablet (5 mg total) by mouth 2 (two) times daily as needed for muscle spasms. 01/20/20   [name], NP  docusate sodium (COLACE) 100 MG capsule Take 1 capsule (100 mg total) by mouth daily as needed for mild constipation. 04/17/19   [name], NP  fluticasone (FLONASE) 50 MCG/ACT nasal spray Place 1 spray into both nostrils daily. 12/31/19   Spero Geralds, MD  furosemide (LASIX) 20 MG tablet Take 1 tablet (20 mg total) by mouth daily as needed. 04/17/19   [name], NP  gabapentin (NEURONTIN) 300 MG capsule Take 1 capsule (300 mg total) by mouth 3 (three) times daily. 09/11/19   [name], NP  ipratropium (ATROVENT HFA) 17 MCG/ACT inhaler Inhale 2 puffs into the lungs every 6 (six) hours as needed for wheezing. 01/14/20   Saguier, Percell Miller, PA-C  levothyroxine (SYNTHROID)  200 MCG tablet TAKE ONE TABLET BY MOUTH EVERY MORNING BEFORE BREAKFAST 11/12/19   [name], NP  lovastatin (MEVACOR) 40 MG tablet Take 1 tablet (40 mg total) by mouth at bedtime. 04/17/19   [name], NP  montelukast (SINGULAIR) 10 MG tablet TAKE ONE TABLET EVERY MORNING 04/17/19   [name], NP  pantoprazole (PROTONIX) 40 MG tablet Take 1 tablet (40 mg total) by mouth daily. MUST HAVE OFFICE VISIT FOR FURTHER REFILLS 12/17/19   Pyrtle, Lajuan Lines, MD  predniSONE (DELTASONE) 10 MG tablet Take 6 tablets (60 mg total) by mouth daily for 5 days, THEN 4 tablets (40 mg total) daily for 5 days, THEN 2 tablets (20 mg total) daily for 5 days. 01/21/20 02/05/20  Octaviano Glow  J, MD  predniSONE (STERAPRED UNI-PAK 21 TAB) 10 MG (21) TBPK tablet Taper over 6 days 01/14/20   Saguier, Percell Miller, PA-C  rOPINIRole (REQUIP) 0.5 MG tablet Take 1 tablet (0.5 mg total) by mouth at bedtime. 10/08/19   [name], NP  sildenafil (REVATIO) 20 MG tablet Take 1 to 2 tablets by mouth 30 minutes prior to sexual activity. 12/16/19   [name], NP  tamsulosin (FLOMAX) 0.4 MG CAPS capsule TAKE ONE CAPSULE BY MOUTH DAILY 11/12/19   [name], NP  varenicline (CHANTIX PAK) 0.5 MG X 11 & 1 MG X 42 tablet Take one 0.5 mg tablet by mouth once daily for 3 days, then increase to one 0.5 mg tablet twice daily for 4 days, then increase to one 1 mg tablet twice daily. 12/31/19   Spero Geralds, MD  VENTOLIN HFA 108 [phone] Base) MCG/ACT inhaler INHALE 2 PUFFS INTO THE LUNGS EVERY 4 HOURS AS NEEDED FOR WHEEZING OR SHORTNESS OF BREATH 10/16/19   [name], NP    Allergies    Aspirin, Doxycycline hyclate, Ibuprofen, Jynarque [tolvaptan], Peanut butter flavor, and Cephalexin  Review of Systems   Review of Systems  Constitutional: Positive for fatigue. Negative for chills and fever.  Eyes: Negative for photophobia and visual disturbance.  Respiratory: Positive for cough, chest tightness,  shortness of breath and wheezing.   Cardiovascular: Negative for chest pain and palpitations.  Gastrointestinal: Negative for abdominal pain and vomiting.  Musculoskeletal: Negative for arthralgias and back pain.  Skin: Negative for color change and rash.  All other systems reviewed and are negative.   Physical Exam Updated Vital Signs BP (!) 132/92   Pulse 96   Temp 98.8 F (37.1 C) (Oral)   Resp 20   Ht 5\' 6"  (1.676 m) Comment: Simultaneous filing. User may not have seen previous data.  Wt 99.8 kg Comment: Simultaneous filing. User may not have seen previous data.  SpO2 94%   BMI 35.51 kg/m   Physical Exam Vitals and nursing note reviewed.  Constitutional:      Appearance: He is well-developed.  HENT:     Head: Normocephalic and atraumatic.  Eyes:     Conjunctiva/sclera: Conjunctivae normal.  Cardiovascular:     Rate and Rhythm: Normal rate and regular rhythm.     Heart sounds: No murmur.  Pulmonary:     Comments: 97% on room air Diffused wheezing Abdominal:     Palpations: Abdomen is soft.     Tenderness: There is no abdominal tenderness.  Musculoskeletal:     Cervical back: Neck supple.  Skin:    General: Skin is warm and dry.  Neurological:     Mental Status: He is alert.     ED Results / Procedures / Treatments   Labs (all labs ordered are listed, but only abnormal results are displayed) Labs Reviewed  BASIC METABOLIC PANEL - Abnormal; Notable for the following components:      Result Value   Glucose, Bld 101 (*)    All other components within normal limits  CBC WITH DIFFERENTIAL/PLATELET - Abnormal; Notable for the following components:   Hemoglobin 17.4 (*)    All other components within normal limits  POCT I-STAT EG7 - Abnormal; Notable for the following components:   pCO2, Ven 42.5 (*)    Hemoglobin 17.3 (*)    All other components within normal limits  I-STAT VENOUS BLOOD GAS, ED    EKG None  Radiology DG Chest [facility] 1 View  Result  Date: 01/20/2020 CLINICAL DATA:  Shortness of breath. Asthma exacerbation. COVID-19. EXAM: PORTABLE CHEST 1 VIEW COMPARISON:  05/15/2019 and chest CT dated 04/29/2016 FINDINGS: The heart size and mediastinal contours are within normal limits. Both lungs are clear. The visualized skeletal structures are unremarkable. IMPRESSION: No active disease. Electronically Signed   By: Lorriane Shire M.D.   On: 01/20/2020 18:15    Procedures Procedures (including critical care time)  Medications Ordered in ED Medications  albuterol (VENTOLIN HFA) 108 (90 Base) MCG/ACT inhaler (8 puffs  Given 01/20/20 1703)  ipratropium (ATROVENT HFA) 17 MCG/ACT inhaler (2 puffs  Given 01/20/20 1703)  methylPREDNISolone sodium succinate (SOLU-MEDROL) 125 mg/2 mL injection 125 mg (125 mg Intravenous Given 01/20/20 1717)  magnesium sulfate IVPB 2 g 50 mL (0 g Intravenous Stopped 01/20/20 1832)  albuterol (VENTOLIN HFA) 108 (90 Base) MCG/ACT inhaler 4 puff (4 puffs Inhalation Given 01/20/20 2024)    ED Course  I have reviewed the triage vital signs and the nursing notes.  Pertinent labs & imaging results that were available during my care of the patient were reviewed by me and considered in my medical decision making (see chart for details).  59 yo male w/ recent COVID diagnosis and hx of asthma/COPD presenting with wheezing and chest tightness.  Concern for asthma/copd exacerbation.  VBG without significant CO2 elevation.  DG chest unremarkable.  No leukocytosis.  Patient given several puffs of albuterol, as well as IV solumedrol and IV magnesium.  Due to COVID status we were unable to provide nebulizer treatments.  He did improve clinically after his treatments, and reported he was feeling better.   Clinical Course as of Jan 20 1118  Mon Jan 20, 2020  1959 Still wheezing but with some improvement, lying on bed using phone, speaking more comfortably.  I advised him to immediately give a nebulizer treatment when returning home  and use nebs every 4 hours for the next 2 days.  Will also prescribe a longer course of steroids.  He verbalized understanding.   [MT]    Clinical Course User Index [MT] Chris Narasimhan, Carola Rhine, MD    Final Clinical Impression(s) / ED Diagnoses Final diagnoses:  Exacerbation of asthma, unspecified asthma severity, unspecified whether persistent  COVID-19    Rx / DC Orders ED Discharge Orders         Ordered    predniSONE (DELTASONE) 10 MG tablet     01/20/20 2004           Wyvonnia Dusky, MD 01/21/20 1120

## 2020-01-20 NOTE — Telephone Encounter (Signed)
rx sent for cyclobenzaprine instead. Please advise pt.

## 2020-01-21 MED FILL — predniSONE 10 MG TABS: 10 | 15 days supply | Qty: 60 | Fill #0

## 2020-01-22 ENCOUNTER — Ambulatory Visit (HOSPITAL_COMMUNITY)
Admission: RE | Admit: 2020-01-22 | Discharge: 2020-01-22 | Disposition: A | Discharge status: Home / Self Care | Payer: Medicare Other | Source: Ambulatory Visit | Attending: Pulmonary Disease | Admitting: Pulmonary Disease

## 2020-01-22 DIAGNOSIS — Z23 Encounter for immunization: Secondary | ICD-10-CM | POA: Insufficient documentation

## 2020-01-22 DIAGNOSIS — U071 COVID-19: Secondary | ICD-10-CM | POA: Diagnosis present

## 2020-01-22 DIAGNOSIS — Q613 Polycystic kidney, unspecified: Secondary | ICD-10-CM

## 2020-01-22 DIAGNOSIS — J45901 Unspecified asthma with (acute) exacerbation: Secondary | ICD-10-CM

## 2020-01-22 MED ORDER — DIPHENHYDRAMINE HCL 50 MG/ML IJ SOLN: 50.0000 mg | Freq: Once | INTRAMUSCULAR | Status: DC | PRN

## 2020-01-22 MED ORDER — EPINEPHRINE 0.3 MG/0.3ML IJ SOAJ: 0.3000 mg | Freq: Once | INTRAMUSCULAR | Status: DC | PRN

## 2020-01-22 MED ORDER — SODIUM CHLORIDE 0.9 % IV SOLN
700.0000 mg | Freq: Once | INTRAVENOUS | Status: AC
  Administered 2020-01-22: 700 mg via INTRAVENOUS
  Filled 2020-01-22: qty 20

## 2020-01-22 MED ORDER — SODIUM CHLORIDE 0.9 % IV SOLN
INTRAVENOUS | Status: DC | PRN
  Administered 2020-01-22: 250 mL via INTRAVENOUS

## 2020-01-22 MED ORDER — METHYLPREDNISOLONE SODIUM SUCC 125 MG IJ SOLR: 125.0000 mg | Freq: Once | INTRAMUSCULAR | Status: DC | PRN

## 2020-01-22 MED ORDER — FAMOTIDINE IN NACL 20-0.9 MG/50ML-% IV SOLN: 20.0000 mg | Freq: Once | INTRAVENOUS | Status: DC | PRN

## 2020-01-22 MED ORDER — ALBUTEROL SULFATE HFA 108 (90 BASE) MCG/ACT IN AERS: 2.0000 | INHALATION_SPRAY | Freq: Once | RESPIRATORY_TRACT | Status: DC | PRN

## 2020-01-22 NOTE — Discharge Instructions (Signed)
10 Things You Can Do to Manage Your COVID-19 Symptoms at Home If you have possible or confirmed COVID-19: 1. Stay home from work and school. And stay away from other public places. If you must go out, avoid using any kind of public transportation, ridesharing, or taxis. 2. Monitor your symptoms carefully. If your symptoms get worse, call your healthcare provider immediately. 3. Get rest and stay hydrated. 4. If you have a medical appointment, call the healthcare provider ahead of time and tell them that you have or may have COVID-19. 5. For medical emergencies, call 911 and notify the dispatch personnel that you have or may have COVID-19. 6. Cover your cough and sneezes with a tissue or use the inside of your elbow. 7. Wash your hands often with soap and water for at least 20 seconds or clean your hands with an alcohol-based hand sanitizer that contains at least 60% alcohol. 8. As much as possible, stay in a specific room and away from other people in your home. Also, you should use a separate bathroom, if available. If you need to be around other people in or outside of the home, wear a mask. 9. Avoid sharing personal items with other people in your household, like dishes, towels, and bedding. 10. Clean all surfaces that are touched often, like counters, tabletops, and doorknobs. Use household cleaning sprays or wipes according to the label instructions. cdc.gov/coronavirus 06/26/2019 This information is not intended to replace advice given to you by your health care provider. Make sure you discuss any questions you have with your health care provider. Document Revised: 11/28/2019 Document Reviewed: 11/28/2019 Elsevier Patient Education  2020 Elsevier Inc.  What types of side effects do monoclonal antibody drugs cause?  Common side effects  In general, the more common side effects caused by monoclonal antibody drugs include: . Allergic reactions, such as hives or itching . Flu-like signs and  symptoms, including chills, fatigue, fever, and muscle aches and pains . Nausea, vomiting . Diarrhea . Skin rashes . Low blood pressure   The CDC is recommending patients who receive monoclonal antibody treatments wait at least 90 days before being vaccinated.  Currently, there are no data on the safety and efficacy of mRNA COVID-19 vaccines in persons who received monoclonal antibodies or convalescent plasma as part of COVID-19 treatment. Based on the estimated half-life of such therapies as well as evidence suggesting that reinfection is uncommon in the 90 days after initial infection, vaccination should be deferred for at least 90 days, as a precautionary measure until additional information becomes available, to avoid interference of the antibody treatment with vaccine-induced immune responses.  What types of side effects do monoclonal antibody drugs cause?  Common side effects  In general, the more common side effects caused by monoclonal antibody drugs include: . Allergic reactions, such as hives or itching . Flu-like signs and symptoms, including chills, fatigue, fever, and muscle aches and pains . Nausea, vomiting . Diarrhea . Skin rashes . Low blood pressure   The CDC is recommending patients who receive monoclonal antibody treatments wait at least 90 days before being vaccinated.  Currently, there are no data on the safety and efficacy of mRNA COVID-19 vaccines in persons who received monoclonal antibodies or convalescent plasma as part of COVID-19 treatment. Based on the estimated half-life of such therapies as well as evidence suggesting that reinfection is uncommon in the 90 days after initial infection, vaccination should be deferred for at least 90 days, as a precautionary measure until   additional information becomes available, to avoid interference of the antibody treatment with vaccine-induced immune responses. 

## 2020-01-22 NOTE — Progress Notes (Signed)
  Diagnosis: COVID-19  Physician: Asencion Noble  Procedure: Covid Infusion Clinic Med: bamlanivimab infusion - Provided patient with bamlanimivab fact sheet for patients, parents and caregivers prior to infusion.  Complications: No immediate complications noted.  Discharge: Discharged home   Foley, Friend C 01/22/2020

## 2020-01-28 ENCOUNTER — Ambulatory Visit (INDEPENDENT_AMBULATORY_CARE_PROVIDER_SITE_OTHER): Payer: Medicare HMO | Admitting: Family

## 2020-01-28 ENCOUNTER — Encounter: Payer: Self-pay | Admitting: Family

## 2020-01-28 ENCOUNTER — Other Ambulatory Visit: Payer: Self-pay

## 2020-01-28 DIAGNOSIS — U071 COVID-19: Secondary | ICD-10-CM

## 2020-01-28 MED ORDER — AZITHROMYCIN 250 MG PO TABS: ORAL_TABLET | ORAL | 0 refills | Status: DC

## 2020-01-28 NOTE — Progress Notes (Signed)
Virtual Visit via Video Note  I connected with Kyle Blair on 01/28/20 at  3:00 PM EST by a video enabled telemedicine application and verified that I am speaking with the correct person using two identifiers.  Location: Patient: home Provider: work   I discussed the limitations of evaluation and management by telemedicine and the availability of in person appointments. The patient expressed understanding and agreed to proceed.  History of Present Illness:  Patient is a 59 yr old male who presents today with chief complaint of chest congestion. He tested positive for covid-19 12 days ago. He received Banlanivamad infusion on 01/22/20. Was seen in the ED on 01/20/20 and had a negative CXR at that time.   He reports that he is "breathing OK." Has thick "butterscotch colored sputum." Using mucinex with some help.   He reports that congestion has started since he was seen in the ER.  Denies significant shortness of breath.    Past Medical History:  Diagnosis Date  . Arthritis    severe; pt takes strong pain meds daily/legs  . Asthma    MONTH AGO  . BPH (benign prostatic hypertrophy)   . Diverticulosis   . GERD with stricture   . Hiatal hernia   . History of stroke 2004  . Hyperlipidemia   . Hypertension   . Hypothyroid   . Lung nodule 02/03/2012  . Osteoarthritis of left knee   . Polycystic kidney disease   . Stroke ([place])    2004   EQULIBRIUM, AND JUDGING DISTANCE  . Tubular adenoma of colon      Social History   Socioeconomic History  . Marital status: Married    Spouse name: Not on file  . Number of children: 2  . Years of education: Not on file  . Highest education level: Not on file  Occupational History  . Occupation: DISABLED    Employer: UNEMPLOYED  Tobacco Use  . Smoking status: Current Every Day Smoker    Packs/day: 0.50    Years: 20.00    Pack years: 10.00    Types: Cigarettes  . Smokeless tobacco: Never Used  Substance and Sexual Activity  . Alcohol  use: No    Alcohol/week: 0.0 standard drinks  . Drug use: No  . Sexual activity: Not on file  Other Topics Concern  . Not on file  Social History Narrative   Caffeine use:  1 pot coffee daily, 1 glass tea   Regular exercise:  No. Has active lifestyle.   2 biological and 1 stepson.   Former smoker- quit in 2007   He is on disability- reports that he has a history of heat stroke.              Social Determinants of Health   Financial Resource Strain:   . Difficulty of Paying Living Expenses: Not on file  Food Insecurity:   . Worried About Charity fundraiser in the Last Year: Not on file  . Ran Out of Food in the Last Year: Not on file  Transportation Needs:   . Lack of Transportation (Medical): Not on file  . Lack of Transportation (Non-Medical): Not on file  Physical Activity:   . Days of Exercise per Week: Not on file  . Minutes of Exercise per Session: Not on file  Stress:   . Feeling of Stress : Not on file  Social Connections:   . Frequency of Communication with Friends and Family: Not on file  .  Frequency of Social Gatherings with Friends and Family: Not on file  . Attends Religious Services: Not on file  . Active Member of Clubs or Organizations: Not on file  . Attends Archivist Meetings: Not on file  . Marital Status: Not on file  Intimate Partner Violence:   . Fear of Current or Ex-Partner: Not on file  . Emotionally Abused: Not on file  . Physically Abused: Not on file  . Sexually Abused: Not on file    Past Surgical History:  Procedure Laterality Date  . CARPAL TUNNEL RELEASE    . COLONOSCOPY    . HERNIA REPAIR  2005   with mesh  . KNEE ARTHROSCOPY Bilateral 12/05/13   cyst and bone spur removed from left knee per pt.  Marland Kitchen KNEE ARTHROSCOPY Left 01/2015  . NASAL POLYP EXCISION  1992 or 93  . POLYPECTOMY    . TOTAL KNEE ARTHROPLASTY Right 05/19/2015   Procedure: RIGHT TOTAL KNEE ARTHROPLASTY;  Surgeon: Paralee Cancel, MD;  Location: [facility];   Service: Orthopedics;  Laterality: Right;  . TOTAL KNEE ARTHROPLASTY Left 11/19/2018   Procedure: LEFT  TOTAL KNEE ARTHROPLASTY;  Surgeon: Dorna Leitz, MD;  Location: [facility];  Service: Orthopedics;  Laterality: Left;    Family History  Problem Relation Age of Onset  . Heart disease Mother        pacemaker  . Diabetes Father   . Kidney disease Father   . Diabetes Sister   . Kidney disease Sister   . Colon cancer Neg Hx   . Esophageal cancer Neg Hx   . Rectal cancer Neg Hx   . Stomach cancer Neg Hx   . Prostate cancer Neg Hx   . Pancreatic cancer Neg Hx     Allergies  Allergen Reactions  . Aspirin Anaphylaxis  . Doxycycline Hyclate Swelling    Facial Swelling  . Ibuprofen Anaphylaxis  . Jynarque [Tolvaptan] Anaphylaxis  . Peanut Butter Flavor Anaphylaxis  . Cephalexin Rash    Current Outpatient Medications on File Prior to Visit  Medication Sig Dispense Refill  . acetaminophen (TYLENOL) 325 MG tablet Take 1-2 tablets (325-650 mg total) by mouth every 6 (six) hours as needed for mild pain or moderate pain. 30 tablet 0  . ADVAIR DISKUS 500-50 MCG/DOSE AEPB USE ONE INHALATION TWICE A DAY. 60 each 3  . albuterol (PROVENTIL) (2.5 MG/3ML) 0.083% nebulizer solution USE 1 VIAL VIA NEBULIZER EVERY 6 HOURS AS NEEDED FOR WHEEZING OR SHORTNESS OF BREATH 75 mL 12  . amLODipine (NORVASC) 10 MG tablet Take 1 tablet (10 mg total) by mouth daily. 30 tablet 5  . apixaban (ELIQUIS) 2.5 MG TABS tablet Take 1 tablet (2.5 mg total) by mouth 2 (two) times daily. 42 tablet 0  . benazepril (LOTENSIN) 20 MG tablet Take 1 tablet (20 mg total) by mouth daily. 30 tablet 5  . clotrimazole-betamethasone (LOTRISONE) cream Apply 1 application topically 2 (two) times daily. 30 g 0  . cyclobenzaprine (FLEXERIL) 5 MG tablet Take 1 tablet (5 mg total) by mouth 2 (two) times daily as needed for muscle spasms. 60 tablet 2  . docusate sodium (COLACE) 100 MG capsule Take 1 capsule (100 mg total) by mouth daily as  needed for mild constipation. 60 capsule 3  . fluticasone (FLONASE) 50 MCG/ACT nasal spray Place 1 spray into both nostrils daily. 16 g 2  . furosemide (LASIX) 20 MG tablet Take 1 tablet (20 mg total) by mouth daily as needed. 30 tablet 5  .  gabapentin (NEURONTIN) 300 MG capsule Take 1 capsule (300 mg total) by mouth 3 (three) times daily. 90 capsule 5  . ipratropium (ATROVENT HFA) 17 MCG/ACT inhaler Inhale 2 puffs into the lungs every 6 (six) hours as needed for wheezing. 1 Inhaler 0  . levothyroxine (SYNTHROID) 200 MCG tablet TAKE ONE TABLET BY MOUTH EVERY MORNING BEFORE BREAKFAST 90 tablet 1  . lovastatin (MEVACOR) 40 MG tablet Take 1 tablet (40 mg total) by mouth at bedtime. 90 tablet 1  . montelukast (SINGULAIR) 10 MG tablet TAKE ONE TABLET EVERY MORNING 90 tablet 1  . pantoprazole (PROTONIX) 40 MG tablet Take 1 tablet (40 mg total) by mouth daily. MUST HAVE OFFICE VISIT FOR FURTHER REFILLS 90 tablet 0  . predniSONE (DELTASONE) 10 MG tablet Take 6 tablets (60 mg total) by mouth daily for 5 days, THEN 4 tablets (40 mg total) daily for 5 days, THEN 2 tablets (20 mg total) daily for 5 days. 60 tablet 0  . rOPINIRole (REQUIP) 0.5 MG tablet Take 1 tablet (0.5 mg total) by mouth at bedtime. 30 tablet 5  . sildenafil (REVATIO) 20 MG tablet Take 1 to 2 tablets by mouth 30 minutes prior to sexual activity. 25 tablet 5  . tamsulosin (FLOMAX) 0.4 MG CAPS capsule TAKE ONE CAPSULE BY MOUTH DAILY 90 capsule 1  . varenicline (CHANTIX PAK) 0.5 MG X 11 & 1 MG X 42 tablet Take one 0.5 mg tablet by mouth once daily for 3 days, then increase to one 0.5 mg tablet twice daily for 4 days, then increase to one 1 mg tablet twice daily. 53 tablet 1  . VENTOLIN HFA 108 (90 Base) MCG/ACT inhaler INHALE 2 PUFFS INTO THE LUNGS EVERY 4 HOURS AS NEEDED FOR WHEEZING OR SHORTNESS OF BREATH 18 g 5   No current facility-administered medications on file prior to visit.    There were no vitals taken for this visit.      Observations/Objective:   Gen: Awake, alert, no acute distress Resp: Breathing is even and non-labored Psych: calm/pleasant demeanor Neuro: Alert and Oriented x 3, + facial symmetry, speech is clear.   Assessment and Plan:  COVID-19- due to worsening chest congestion will rx with azithromycin.  He is advised to continue mucinex as needed, stay well hydrated and place a humidifier in his bedroom. He is advised to call if symptoms worsen or if symptoms fail to improve.   Follow Up Instructions:    I discussed the assessment and treatment plan with the patient. The patient was provided an opportunity to ask questions and all were answered. The patient agreed with the plan and demonstrated an understanding of the instructions.   The patient was advised to call back or seek an in-person evaluation if the symptoms worsen or if the condition fails to improve as anticipated.  Nance Pear, NP

## 2020-02-03 ENCOUNTER — Ambulatory Visit (INDEPENDENT_AMBULATORY_CARE_PROVIDER_SITE_OTHER): Payer: Medicare HMO | Admitting: Family

## 2020-02-03 ENCOUNTER — Telehealth: Payer: Self-pay

## 2020-02-03 ENCOUNTER — Encounter: Payer: Self-pay | Admitting: Family

## 2020-02-03 ENCOUNTER — Telehealth: Payer: Self-pay | Admitting: Family

## 2020-02-03 VITALS — HR 80

## 2020-02-03 DIAGNOSIS — R05 Cough: Secondary | ICD-10-CM

## 2020-02-03 DIAGNOSIS — R059 Cough, unspecified: Secondary | ICD-10-CM

## 2020-02-03 MED ORDER — BENZONATATE 100 MG PO CAPS: 200.0000 mg | ORAL_CAPSULE | Freq: Three times a day (TID) | ORAL | 0 refills | Status: DC | PRN

## 2020-02-03 NOTE — Progress Notes (Signed)
Virtual Visit via Telephone Note  I connected with Kyle Blair on 02/03/20 at 11:40 AM EST by telephone and verified that I am speaking with the correct person using two identifiers.  Location: Patient: home Provider: home   I discussed the limitations, risks, security and privacy concerns of performing an evaluation and management service by telephone and the availability of in person appointments. I also discussed with the patient that there may be a patient responsible charge related to this service. The patient expressed understanding and agreed to proceed.   History of Present Illness:  Patient is a 59 yr old male who presents today to discuss persistent cough. He was last seen on 01/28/20 for a virtual visit.  We treated him at that time with azithromycin and mucinex for thick colored sputum and cough.   He reports that he is having trouble sleeping at night due to cough. Using [facility] generic robitussin.  Reports that his breathing seems OK.  Reports that cough is relentless.    Past Medical History:  Diagnosis Date  . Arthritis    severe; pt takes strong pain meds daily/legs  . Asthma    MONTH AGO  . BPH (benign prostatic hypertrophy)   . Diverticulosis   . GERD with stricture   . Hiatal hernia   . History of stroke 2004  . Hyperlipidemia   . Hypertension   . Hypothyroid   . Lung nodule 02/03/2012  . Osteoarthritis of left knee   . Polycystic kidney disease   . Stroke ([place])    2004   EQULIBRIUM, AND JUDGING DISTANCE  . Tubular adenoma of colon      Social History   Socioeconomic History  . Marital status: Married    Spouse name: Not on file  . Number of children: 2  . Years of education: Not on file  . Highest education level: Not on file  Occupational History  . Occupation: DISABLED    Employer: UNEMPLOYED  Tobacco Use  . Smoking status: Current Every Day Smoker    Packs/day: 0.50    Years: 20.00    Pack years: 10.00    Types: Cigarettes  . Smokeless  tobacco: Never Used  Substance and Sexual Activity  . Alcohol use: No    Alcohol/week: 0.0 standard drinks  . Drug use: No  . Sexual activity: Not on file  Other Topics Concern  . Not on file  Social History Narrative   Caffeine use:  1 pot coffee daily, 1 glass tea   Regular exercise:  No. Has active lifestyle.   2 biological and 1 stepson.   Former smoker- quit in 2007   He is on disability- reports that he has a history of heat stroke.              Social Determinants of Health   Financial Resource Strain:   . Difficulty of Paying Living Expenses: Not on file  Food Insecurity:   . Worried About Charity fundraiser in the Last Year: Not on file  . Ran Out of Food in the Last Year: Not on file  Transportation Needs:   . Lack of Transportation (Medical): Not on file  . Lack of Transportation (Non-Medical): Not on file  Physical Activity:   . Days of Exercise per Week: Not on file  . Minutes of Exercise per Session: Not on file  Stress:   . Feeling of Stress : Not on file  Social Connections:   . Frequency of  Communication with Friends and Family: Not on file  . Frequency of Social Gatherings with Friends and Family: Not on file  . Attends Religious Services: Not on file  . Active Member of Clubs or Organizations: Not on file  . Attends Archivist Meetings: Not on file  . Marital Status: Not on file  Intimate Partner Violence:   . Fear of Current or Ex-Partner: Not on file  . Emotionally Abused: Not on file  . Physically Abused: Not on file  . Sexually Abused: Not on file    Past Surgical History:  Procedure Laterality Date  . CARPAL TUNNEL RELEASE    . COLONOSCOPY    . HERNIA REPAIR  2005   with mesh  . KNEE ARTHROSCOPY Bilateral 12/05/13   cyst and bone spur removed from left knee per pt.  Marland Kitchen KNEE ARTHROSCOPY Left 01/2015  . NASAL POLYP EXCISION  1992 or 93  . POLYPECTOMY    . TOTAL KNEE ARTHROPLASTY Right 05/19/2015   Procedure: RIGHT TOTAL KNEE  ARTHROPLASTY;  Surgeon: Paralee Cancel, MD;  Location: [facility];  Service: Orthopedics;  Laterality: Right;  . TOTAL KNEE ARTHROPLASTY Left 11/19/2018   Procedure: LEFT  TOTAL KNEE ARTHROPLASTY;  Surgeon: Dorna Leitz, MD;  Location: [place];  Service: Orthopedics;  Laterality: Left;    Family History  Problem Relation Age of Onset  . Heart disease Mother        pacemaker  . Diabetes Father   . Kidney disease Father   . Diabetes Sister   . Kidney disease Sister   . Colon cancer Neg Hx   . Esophageal cancer Neg Hx   . Rectal cancer Neg Hx   . Stomach cancer Neg Hx   . Prostate cancer Neg Hx   . Pancreatic cancer Neg Hx     Allergies  Allergen Reactions  . Aspirin Anaphylaxis  . Doxycycline Hyclate Swelling    Facial Swelling  . Ibuprofen Anaphylaxis  . Jynarque [Tolvaptan] Anaphylaxis  . Peanut Butter Flavor Anaphylaxis  . Cephalexin Rash    Current Outpatient Medications on File Prior to Visit  Medication Sig Dispense Refill  . acetaminophen (TYLENOL) 325 MG tablet Take 1-2 tablets (325-650 mg total) by mouth every 6 (six) hours as needed for mild pain or moderate pain. 30 tablet 0  . ADVAIR DISKUS 500-50 MCG/DOSE AEPB USE ONE INHALATION TWICE A DAY. 60 each 3  . albuterol (PROVENTIL) (2.5 MG/3ML) 0.083% nebulizer solution USE 1 VIAL VIA NEBULIZER EVERY 6 HOURS AS NEEDED FOR WHEEZING OR SHORTNESS OF BREATH 75 mL 12  . amLODipine (NORVASC) 10 MG tablet Take 1 tablet (10 mg total) by mouth daily. 30 tablet 5  . apixaban (ELIQUIS) 2.5 MG TABS tablet Take 1 tablet (2.5 mg total) by mouth 2 (two) times daily. 42 tablet 0  . azithromycin (ZITHROMAX) 250 MG tablet Take 2 tabs by mouth today, then one tab once daily for 4 more days. 6 tablet 0  . benazepril (LOTENSIN) 20 MG tablet Take 1 tablet (20 mg total) by mouth daily. 30 tablet 5  . clotrimazole-betamethasone (LOTRISONE) cream Apply 1 application topically 2 (two) times daily. 30 g 0  . cyclobenzaprine (FLEXERIL) 5 MG tablet Take 1  tablet (5 mg total) by mouth 2 (two) times daily as needed for muscle spasms. 60 tablet 2  . docusate sodium (COLACE) 100 MG capsule Take 1 capsule (100 mg total) by mouth daily as needed for mild constipation. 60 capsule 3  . fluticasone (FLONASE)  50 MCG/ACT nasal spray Place 1 spray into both nostrils daily. 16 g 2  . furosemide (LASIX) 20 MG tablet Take 1 tablet (20 mg total) by mouth daily as needed. 30 tablet 5  . gabapentin (NEURONTIN) 300 MG capsule Take 1 capsule (300 mg total) by mouth 3 (three) times daily. 90 capsule 5  . ipratropium (ATROVENT HFA) 17 MCG/ACT inhaler Inhale 2 puffs into the lungs every 6 (six) hours as needed for wheezing. 1 Inhaler 0  . levothyroxine (SYNTHROID) 200 MCG tablet TAKE ONE TABLET BY MOUTH EVERY MORNING BEFORE BREAKFAST 90 tablet 1  . lovastatin (MEVACOR) 40 MG tablet Take 1 tablet (40 mg total) by mouth at bedtime. 90 tablet 1  . montelukast (SINGULAIR) 10 MG tablet TAKE ONE TABLET EVERY MORNING 90 tablet 1  . pantoprazole (PROTONIX) 40 MG tablet Take 1 tablet (40 mg total) by mouth daily. MUST HAVE OFFICE VISIT FOR FURTHER REFILLS 90 tablet 0  . predniSONE (DELTASONE) 10 MG tablet Take 6 tablets (60 mg total) by mouth daily for 5 days, THEN 4 tablets (40 mg total) daily for 5 days, THEN 2 tablets (20 mg total) daily for 5 days. 60 tablet 0  . rOPINIRole (REQUIP) 0.5 MG tablet Take 1 tablet (0.5 mg total) by mouth at bedtime. 30 tablet 5  . sildenafil (REVATIO) 20 MG tablet Take 1 to 2 tablets by mouth 30 minutes prior to sexual activity. 25 tablet 5  . tamsulosin (FLOMAX) 0.4 MG CAPS capsule TAKE ONE CAPSULE BY MOUTH DAILY 90 capsule 1  . varenicline (CHANTIX PAK) 0.5 MG X 11 & 1 MG X 42 tablet Take one 0.5 mg tablet by mouth once daily for 3 days, then increase to one 0.5 mg tablet twice daily for 4 days, then increase to one 1 mg tablet twice daily. 53 tablet 1  . VENTOLIN HFA 108 (90 Base) MCG/ACT inhaler INHALE 2 PUFFS INTO THE LUNGS EVERY 4 HOURS AS  NEEDED FOR WHEEZING OR SHORTNESS OF BREATH 18 g 5   No current facility-administered medications on file prior to visit.    There were no vitals taken for this visit.      Observations/Objective:   Gen: Awake, alert Resp: Breathing is even and non-labored Psych: calm/pleasant demeanor Neuro: Alert and Oriented x 3, speech is clear.   Assessment and Plan:  Cough- overall improving clinically.  Will add prn tessalon. Pt is advised to call if symptoms worsen or if symptoms fail to improve.   Follow Up Instructions:    I discussed the assessment and treatment plan with the patient. The patient was provided an opportunity to ask questions and all were answered. The patient agreed with the plan and demonstrated an understanding of the instructions.   The patient was advised to call back or seek an in-person evaluation if the symptoms worsen or if the condition fails to improve as anticipated.  I provided 15 minutes of non-face-to-face time during this encounter.   Nance Pear, NP

## 2020-02-03 NOTE — Telephone Encounter (Signed)
Pt is inquiring about his nebulizer and home health. He is interested in getting it from [facility]. Can you please check status and update patient?

## 2020-02-03 NOTE — Telephone Encounter (Signed)
Patient advised per [facility] ) nebulizer ready for pick up and they tried to contact him on 01-22-2020.  Phone number for [facility] given to patient so he can call them for the address.

## 2020-02-05 DIAGNOSIS — J45909 Unspecified asthma, uncomplicated: Secondary | ICD-10-CM | POA: Diagnosis not present

## 2020-02-10 ENCOUNTER — Telehealth: Payer: Self-pay

## 2020-02-10 ENCOUNTER — Other Ambulatory Visit: Payer: Self-pay

## 2020-02-10 MED ORDER — ATROVENT HFA 17 MCG/ACT IN AERS: 2.0000 | INHALATION_SPRAY | Freq: Four times a day (QID) | RESPIRATORY_TRACT | 1 refills | Status: DC | PRN

## 2020-02-10 NOTE — Telephone Encounter (Signed)
Caller Name: Shri, Enfinger pt Phone: [phone]  Pt noted earlier call was disconnected. He does not need albuterol inhaler. He has that. He needs the atrovent. Pt states that he is having a hard time at night and is out of it. It is more effective than albuterol at night for him. Pt requesting Melissa or covering provider send today due to ongoing COVID symptoms.   Medication: ipratropium (ATROVENT HFA) 17 MCG/ACT inhaler   Has the patient contacted their pharmacy? No - prescribed by [facility] (with phone number or street name):  [place], [place] Phone:  [phone]  Fax:  [phone]

## 2020-02-10 NOTE — Telephone Encounter (Signed)
Patient called in to see if Dr. Conley Canal can call in a prescription for  VENTOLIN HFA 108 (90 Base) MCG/ACT inhaler to his pharmacy.   Thanks,

## 2020-02-10 NOTE — Telephone Encounter (Signed)
Refill sent to the pharmacy as requested.

## 2020-02-12 ENCOUNTER — Ambulatory Visit (INDEPENDENT_AMBULATORY_CARE_PROVIDER_SITE_OTHER): Payer: Medicare HMO | Admitting: Family

## 2020-02-12 ENCOUNTER — Other Ambulatory Visit: Payer: Self-pay

## 2020-02-12 ENCOUNTER — Encounter: Payer: Self-pay | Admitting: Family

## 2020-02-12 ENCOUNTER — Ambulatory Visit (HOSPITAL_BASED_OUTPATIENT_CLINIC_OR_DEPARTMENT_OTHER)
Admission: RE | Admit: 2020-02-12 | Discharge: 2020-02-12 | Disposition: A | Discharge status: Home / Self Care | Payer: Medicare HMO | Source: Ambulatory Visit | Attending: Family | Admitting: Family

## 2020-02-12 DIAGNOSIS — R05 Cough: Secondary | ICD-10-CM | POA: Insufficient documentation

## 2020-02-12 DIAGNOSIS — R059 Cough, unspecified: Secondary | ICD-10-CM

## 2020-02-12 NOTE — Progress Notes (Signed)
Virtual Visit via Video Note  I connected with Kyle Blair on 02/12/20 at  9:20 AM EST by a video enabled telemedicine application and verified that I am speaking with the correct person using two identifiers.  Location: Patient: home Provider: home   I discussed the limitations of evaluation and management by telemedicine and the availability of in person appointments. The patient expressed understanding and agreed to proceed.  History of Present Illness:  Patient is a 59 yr old male who presents today with report of persistent cough since covid-19 infection. He tested positive back in mid-January.  Had an ED visit on 01/20/20 with neg cxr.   We saw him on 01/28/20 and treated him with azithromycin.   Reports that his voice is hoarse from cough.  Notes mild wheezing this AM. Reports that he is very limited in his activity due to the cough. Energy remains low. Denies fever.  Reports oxygen is running 94-96 % on room air.    Past Medical History:  Diagnosis Date  . Arthritis    severe; pt takes strong pain meds daily/legs  . Asthma    MONTH AGO  . BPH (benign prostatic hypertrophy)   . Diverticulosis   . GERD with stricture   . Hiatal hernia   . History of stroke 2004  . Hyperlipidemia   . Hypertension   . Hypothyroid   . Lung nodule 02/03/2012  . Osteoarthritis of left knee   . Polycystic kidney disease   . Stroke ([place])    2004   EQULIBRIUM, AND JUDGING DISTANCE  . Tubular adenoma of colon      Social History   Socioeconomic History  . Marital status: Married    Spouse name: Not on file  . Number of children: 2  . Years of education: Not on file  . Highest education level: Not on file  Occupational History  . Occupation: DISABLED    Employer: UNEMPLOYED  Tobacco Use  . Smoking status: Current Every Day Smoker    Packs/day: 0.50    Years: 20.00    Pack years: 10.00    Types: Cigarettes  . Smokeless tobacco: Never Used  Substance and Sexual Activity  . Alcohol  use: No    Alcohol/week: 0.0 standard drinks  . Drug use: No  . Sexual activity: Not on file  Other Topics Concern  . Not on file  Social History Narrative   Caffeine use:  1 pot coffee daily, 1 glass tea   Regular exercise:  No. Has active lifestyle.   2 biological and 1 stepson.   Former smoker- quit in 2007   He is on disability- reports that he has a history of heat stroke.              Social Determinants of Health   Financial Resource Strain:   . Difficulty of Paying Living Expenses: Not on file  Food Insecurity:   . Worried About Charity fundraiser in the Last Year: Not on file  . Ran Out of Food in the Last Year: Not on file  Transportation Needs:   . Lack of Transportation (Medical): Not on file  . Lack of Transportation (Non-Medical): Not on file  Physical Activity:   . Days of Exercise per Week: Not on file  . Minutes of Exercise per Session: Not on file  Stress:   . Feeling of Stress : Not on file  Social Connections:   . Frequency of Communication with Friends and Family:  Not on file  . Frequency of Social Gatherings with Friends and Family: Not on file  . Attends Religious Services: Not on file  . Active Member of Clubs or Organizations: Not on file  . Attends Archivist Meetings: Not on file  . Marital Status: Not on file  Intimate Partner Violence:   . Fear of Current or Ex-Partner: Not on file  . Emotionally Abused: Not on file  . Physically Abused: Not on file  . Sexually Abused: Not on file    Past Surgical History:  Procedure Laterality Date  . CARPAL TUNNEL RELEASE    . COLONOSCOPY    . HERNIA REPAIR  2005   with mesh  . KNEE ARTHROSCOPY Bilateral 12/05/13   cyst and bone spur removed from left knee per pt.  Marland Kitchen KNEE ARTHROSCOPY Left 01/2015  . NASAL POLYP EXCISION  1992 or 93  . POLYPECTOMY    . TOTAL KNEE ARTHROPLASTY Right 05/19/2015   Procedure: RIGHT TOTAL KNEE ARTHROPLASTY;  Surgeon: Paralee Cancel, MD;  Location: [facility];   Service: Orthopedics;  Laterality: Right;  . TOTAL KNEE ARTHROPLASTY Left 11/19/2018   Procedure: LEFT  TOTAL KNEE ARTHROPLASTY;  Surgeon: Dorna Leitz, MD;  Location: [facility];  Service: Orthopedics;  Laterality: Left;    Family History  Problem Relation Age of Onset  . Heart disease Mother        pacemaker  . Diabetes Father   . Kidney disease Father   . Diabetes Sister   . Kidney disease Sister   . Colon cancer Neg Hx   . Esophageal cancer Neg Hx   . Rectal cancer Neg Hx   . Stomach cancer Neg Hx   . Prostate cancer Neg Hx   . Pancreatic cancer Neg Hx     Allergies  Allergen Reactions  . Aspirin Anaphylaxis  . Doxycycline Hyclate Swelling    Facial Swelling  . Ibuprofen Anaphylaxis  . Jynarque [Tolvaptan] Anaphylaxis  . Peanut Butter Flavor Anaphylaxis  . Cephalexin Rash    Current Outpatient Medications on File Prior to Visit  Medication Sig Dispense Refill  . acetaminophen (TYLENOL) 325 MG tablet Take 1-2 tablets (325-650 mg total) by mouth every 6 (six) hours as needed for mild pain or moderate pain. 30 tablet 0  . ADVAIR DISKUS 500-50 MCG/DOSE AEPB USE ONE INHALATION TWICE A DAY. 60 each 3  . albuterol (PROVENTIL) (2.5 MG/3ML) 0.083% nebulizer solution USE 1 VIAL VIA NEBULIZER EVERY 6 HOURS AS NEEDED FOR WHEEZING OR SHORTNESS OF BREATH 75 mL 12  . amLODipine (NORVASC) 10 MG tablet Take 1 tablet (10 mg total) by mouth daily. 30 tablet 5  . apixaban (ELIQUIS) 2.5 MG TABS tablet Take 1 tablet (2.5 mg total) by mouth 2 (two) times daily. 42 tablet 0  . azithromycin (ZITHROMAX) 250 MG tablet Take 2 tabs by mouth today, then one tab once daily for 4 more days. 6 tablet 0  . benazepril (LOTENSIN) 20 MG tablet Take 1 tablet (20 mg total) by mouth daily. 30 tablet 5  . benzonatate (TESSALON) 100 MG capsule Take 2 capsules (200 mg total) by mouth 3 (three) times daily as needed for cough. 30 capsule 0  . clotrimazole-betamethasone (LOTRISONE) cream Apply 1 application topically 2  (two) times daily. 30 g 0  . cyclobenzaprine (FLEXERIL) 5 MG tablet Take 1 tablet (5 mg total) by mouth 2 (two) times daily as needed for muscle spasms. 60 tablet 2  . docusate sodium (COLACE) 100 MG capsule  Take 1 capsule (100 mg total) by mouth daily as needed for mild constipation. 60 capsule 3  . fluticasone (FLONASE) 50 MCG/ACT nasal spray Place 1 spray into both nostrils daily. 16 g 2  . furosemide (LASIX) 20 MG tablet Take 1 tablet (20 mg total) by mouth daily as needed. 30 tablet 5  . gabapentin (NEURONTIN) 300 MG capsule Take 1 capsule (300 mg total) by mouth 3 (three) times daily. 90 capsule 5  . ipratropium (ATROVENT HFA) 17 MCG/ACT inhaler Inhale 2 puffs into the lungs every 6 (six) hours as needed for wheezing. 1 Inhaler 1  . levothyroxine (SYNTHROID) 200 MCG tablet TAKE ONE TABLET BY MOUTH EVERY MORNING BEFORE BREAKFAST 90 tablet 1  . lovastatin (MEVACOR) 40 MG tablet Take 1 tablet (40 mg total) by mouth at bedtime. 90 tablet 1  . montelukast (SINGULAIR) 10 MG tablet TAKE ONE TABLET EVERY MORNING 90 tablet 1  . pantoprazole (PROTONIX) 40 MG tablet Take 1 tablet (40 mg total) by mouth daily. MUST HAVE OFFICE VISIT FOR FURTHER REFILLS 90 tablet 0  . rOPINIRole (REQUIP) 0.5 MG tablet Take 1 tablet (0.5 mg total) by mouth at bedtime. 30 tablet 5  . sildenafil (REVATIO) 20 MG tablet Take 1 to 2 tablets by mouth 30 minutes prior to sexual activity. 25 tablet 5  . tamsulosin (FLOMAX) 0.4 MG CAPS capsule TAKE ONE CAPSULE BY MOUTH DAILY 90 capsule 1  . varenicline (CHANTIX PAK) 0.5 MG X 11 & 1 MG X 42 tablet Take one 0.5 mg tablet by mouth once daily for 3 days, then increase to one 0.5 mg tablet twice daily for 4 days, then increase to one 1 mg tablet twice daily. 53 tablet 1  . VENTOLIN HFA 108 (90 Base) MCG/ACT inhaler INHALE 2 PUFFS INTO THE LUNGS EVERY 4 HOURS AS NEEDED FOR WHEEZING OR SHORTNESS OF BREATH 18 g 5   No current facility-administered medications on file prior to visit.     There were no vitals taken for this visit.   Observations/Objective:   Gen: Awake, alert, no acute distress Resp: Breathing is even and non-labored Psych: calm/pleasant demeanor Neuro: Alert and Oriented x 3, + facial symmetry, speech is clear.   Assessment and Plan:  Cough- afebrile, oxygen saturation stable on room air. I have advised the pt to proceed to the med center [facility] imaging department to complete a chest x-ray today for further evaluation.  I have placed the order and pt is agreeable. Plan abx if + for PNA.   Follow Up Instructions:    I discussed the assessment and treatment plan with the patient. The patient was provided an opportunity to ask questions and all were answered. The patient agreed with the plan and demonstrated an understanding of the instructions.   The patient was advised to call back or seek an in-person evaluation if the symptoms worsen or if the condition fails to improve as anticipated.  Nance Pear, NP

## 2020-02-14 ENCOUNTER — Telehealth: Payer: Self-pay | Admitting: Family

## 2020-02-14 NOTE — Telephone Encounter (Signed)
Please advise pt that I reviewed his cxr and it is negative for pneumonia. In regards to his ongoing cough, I would recommend that he call to arrange a follow up visit with his pulmonologist and avoid smoking.

## 2020-02-17 NOTE — Telephone Encounter (Signed)
Patient advised of results and provider's advise 

## 2020-02-19 ENCOUNTER — Other Ambulatory Visit: Payer: Self-pay

## 2020-02-19 MED ORDER — BENZONATATE 100 MG PO CAPS: 200.0000 mg | ORAL_CAPSULE | Freq: Three times a day (TID) | ORAL | 0 refills | Status: DC | PRN

## 2020-02-19 MED FILL — BENZONATATE 100 MG CAPS: 100 | 5 days supply | Qty: 30 | Fill #0

## 2020-02-21 MED FILL — FLOVENT HFA 110 MCG INHALER: 110 | 30 days supply | Qty: 12 | Fill #6

## 2020-03-26 ENCOUNTER — Telehealth: Payer: Self-pay | Admitting: Family

## 2020-03-26 NOTE — Progress Notes (Signed)
  Chronic Care Management   Note  03/26/2020 Name: JIGAR LUPOLD MRN: [id number] DOB: September 10, 1961  Orland Jarred is a 59 y.o. year old male who is a primary care patient of [name], NP. I reached out to Orland Jarred by phone today in response to a referral sent by Mr. Edwyna Ready Reisig's PCP, [name], NP.   Mr. Oberg was given information about Chronic Care Management services today including:  1. CCM service includes personalized support from designated clinical staff supervised by his physician, including individualized plan of care and coordination with other care providers 2. 24/7 contact phone numbers for assistance for urgent and routine care needs. 3. Service will only be billed when office clinical staff spend 20 minutes or more in a month to coordinate care. 4. Only one practitioner may furnish and bill the service in a calendar month. 5. The patient may stop CCM services at any time (effective at the end of the month) by phone call to the office staff.   Patient agreed to services and verbal consent obtained.   Follow up plan:   Raynicia Dukes [employer] Scheduler

## 2020-04-07 ENCOUNTER — Other Ambulatory Visit: Payer: Self-pay | Admitting: Internal Medicine

## 2020-04-07 ENCOUNTER — Other Ambulatory Visit: Payer: Self-pay | Admitting: Family

## 2020-04-07 ENCOUNTER — Other Ambulatory Visit: Payer: Self-pay | Admitting: Family Medicine

## 2020-04-07 DIAGNOSIS — J4541 Moderate persistent asthma with (acute) exacerbation: Secondary | ICD-10-CM

## 2020-04-07 DIAGNOSIS — K219 Gastro-esophageal reflux disease without esophagitis: Secondary | ICD-10-CM

## 2020-04-09 MED FILL — FLOVENT HFA 110 MCG INHALER: 110 | 30 days supply | Qty: 12 | Fill #0

## 2020-04-17 ENCOUNTER — Other Ambulatory Visit: Payer: Self-pay | Admitting: Family

## 2020-04-20 ENCOUNTER — Ambulatory Visit: Payer: Medicare HMO | Admitting: Pharmacist

## 2020-04-20 ENCOUNTER — Other Ambulatory Visit: Payer: Self-pay

## 2020-04-20 DIAGNOSIS — I10 Essential (primary) hypertension: Secondary | ICD-10-CM

## 2020-04-20 DIAGNOSIS — E785 Hyperlipidemia, unspecified: Secondary | ICD-10-CM

## 2020-04-20 DIAGNOSIS — G2581 Restless legs syndrome: Secondary | ICD-10-CM

## 2020-04-20 NOTE — Chronic Care Management (AMB) (Signed)
Chronic Care Management Pharmacy  Name: Kyle Blair  MRN: [id number] DOB: 11-29-1961  Chief Complaint/ HPI  Kyle Blair,  59 y.o. , male presents for their Initial CCM visit with the clinical pharmacist via telephone due to COVID-19 Pandemic.  PCP : [name], NP  Their chronic conditions include: Asthma, Hypertension, Hyperlipidemia, Hypothyroidism, Barrett's Esophagus, BPH, Osteoarthritis, Allergic Rhinitis, Restless Leg Syndrome, Polycystic Kidney Disease  Office Visits: 02/12/20: Visit w/ [name], NP - Persistent cough since COVID 19 infection. Pt tested positive in mid January. CXR ordered (negative). Recommended pt to follow up with pulmonology and stop smoking.  02/10/20: Patient call stating atrovent more helpful than albuterol for him  02/03/20: Visit w/ [name], NP - Persistent cough. Last seen on 01/28/20 and treated with azithromycin and mucinex. Added tessalon at this visit.  01/28/20: Visit w/ [name], NP - Visit post covid infection. Prescribed azithromycin. Advised to continue mucinex prn with humidifier and hydration.   01/14/20: Visit w/ Mackie Pai, PA-C - COVID exposure with SOB. Advised to go to ED, but pt refused. Prescribed azithromycin, atrovent, and 6 Zakira Ressel prednisone taper  01/01/20: Visit w/ [name], NP - Pt requested steroid in case of worsening symptoms. Risk expressed to patient and advised to use only if symptoms worsened.   Consult Visit: 12/31/19: Pulmonology visit w/ Dr. Shearon Stalls - Asthma worsened. Smoking cessation discussed in detail. Pt agreeable to trying Chantix.   ED Visit:  01/20/20: [facility] - SOB while COVID Positive (dx on 01/16/20). Scheduled for first antibody infusion in 2 days. Pt given albuterol puffs and IV solumedrol and IV magnesium. Pt clinically improved after treatment  Medications: Outpatient Encounter Medications as of 04/20/2020  Medication Sig Note  .  acetaminophen (TYLENOL) 325 MG tablet Take 1-2 tablets (325-650 mg total) by mouth every 6 (six) hours as needed for mild pain or moderate pain.   Marland Kitchen albuterol (PROVENTIL) (2.5 MG/3ML) 0.083% nebulizer solution USE 1 VIAL VIA NEBULIZER EVERY 6 HOURS AS NEEDED FOR WHEEZING OR SHORTNESS OF BREATH   . clotrimazole-betamethasone (LOTRISONE) cream Apply 1 application topically 2 (two) times daily.   . cyclobenzaprine (FLEXERIL) 5 MG tablet Take 1 tablet (5 mg total) by mouth 2 (two) times daily as needed for muscle spasms.   . fluticasone (FLOVENT HFA) 110 MCG/ACT inhaler INHALE 2 PUFFS INTO THE LUNGS DAILY WHILE IN YOUR YELLOW ZONE. RINSE MOUTH AND SPIT OUT AFTER USE.   . furosemide (LASIX) 20 MG tablet Take 1 tablet (20 mg total) by mouth daily as needed.   Marland Kitchen ipratropium (ATROVENT HFA) 17 MCG/ACT inhaler Inhale 2 puffs into the lungs every 6 (six) hours as needed for wheezing.   Marland Kitchen levothyroxine (SYNTHROID) 200 MCG tablet TAKE ONE TABLET BY MOUTH EVERY MORNING BEFORE BREAKFAST   . lovastatin (MEVACOR) 40 MG tablet TAKE ONE TABLET BY MOUTH EVERY NIGHT AT BEDTIME   . tamsulosin (FLOMAX) 0.4 MG CAPS capsule TAKE ONE CAPSULE BY MOUTH DAILY   . VENTOLIN HFA 108 (90 Base) MCG/ACT inhaler INHALE 2 PUFFS INTO THE LUNGS EVERY 4 HOURS AS NEEDED FOR WHEEZING OR SHORTNESS OF BREATH   . [DISCONTINUED] ADVAIR DISKUS 500-50 MCG/DOSE AEPB USE ONE INHALATION TWICE A Derinda Bartus.   . [DISCONTINUED] benazepril (LOTENSIN) 20 MG tablet Take 1 tablet (20 mg total) by mouth daily.   . [DISCONTINUED] gabapentin (NEURONTIN) 300 MG capsule TAKE 1 CAPSULE BY MOUTH THREE TIMES A Veneda Kirksey   . [DISCONTINUED] pantoprazole (PROTONIX) 40 MG tablet Take 1 tablet (  40 mg total) by mouth daily. MUST HAVE OFFICE VISIT FOR FURTHER REFILLS   . [DISCONTINUED] rOPINIRole (REQUIP) 0.5 MG tablet TAKE 1 TABLET BY MOUTH AT BEDTIME   . apixaban (ELIQUIS) 2.5 MG TABS tablet Take 1 tablet (2.5 mg total) by mouth 2 (two) times daily. (Patient not taking: Reported on  04/20/2020) 04/20/2020: Was given after knee surgery  . azithromycin (ZITHROMAX) 250 MG tablet Take 2 tabs by mouth today, then one tab once daily for 4 more days. (Patient not taking: Reported on 04/20/2020)   . benzonatate (TESSALON) 100 MG capsule Take 2 capsules (200 mg total) by mouth 3 (three) times daily as needed for cough. (Patient not taking: Reported on 04/20/2020)   . docusate sodium (COLACE) 100 MG capsule Take 1 capsule (100 mg total) by mouth daily as needed for mild constipation. (Patient not taking: Reported on 04/20/2020)   . fluticasone (FLONASE) 50 MCG/ACT nasal spray Place 1 spray into both nostrils daily. (Patient not taking: Reported on 04/20/2020)   . sildenafil (REVATIO) 20 MG tablet Take 1 to 2 tablets by mouth 30 minutes prior to sexual activity.   . varenicline (CHANTIX PAK) 0.5 MG X 11 & 1 MG X 42 tablet Take one 0.5 mg tablet by mouth once daily for 3 days, then increase to one 0.5 mg tablet twice daily for 4 days, then increase to one 1 mg tablet twice daily.   . [DISCONTINUED] amLODipine (NORVASC) 10 MG tablet Take 1 tablet (10 mg total) by mouth daily. 04/20/2020: Hasn't filled since 10/2019  . [DISCONTINUED] montelukast (SINGULAIR) 10 MG tablet TAKE ONE TABLET EVERY MORNING 04/20/2020: Has not taken in 3 months due to no refill   No facility-administered encounter medications on file as of 04/20/2020.     Current Diagnosis/Assessment:  Goals Addressed            This Visit's Progress   . Pharmacy Care Plan       CARE PLAN ENTRY  Current Barriers:  . Chronic Disease Management support, education, and care coordination needs related to Asthma, Hypertension, Hyperlipidemia, Hypothyroidism, Barrett's Esophagus, BPH, Osteoarthritis, Allergic Rhinitis, Restless Leg Syndrome, Polycystic Kidney Disease   Hypertension . Pharmacist Clinical Goal(s): o Over the next 90 days, patient will work with PharmD and providers to maintain BP goal <140/90 . Current regimen:   o Amlodipine 10mg  daily, benazepril 20mg  daily . Interventions: o Consider purchasing new blood pressure cuff . Patient self care activities - Over the next 90 days, patient will: o Check BP 2-3 times per week, document, and provide at future appointments o Ensure daily salt intake < 2300 mg/Dwane Andres  Hyperlipidemia . Pharmacist Clinical Goal(s): o Over the next 90 days, patient will work with PharmD and providers to achieve LDL goal <70 . Current regimen:  o Lovastatin 40mg  daily . Interventions: o Consider completing lipid panel at next visit o Consider changing statin to high intensity statin to help reach LDL goal . Patient self care activities - Over the next 90 days, patient will: o Maintain cholesterol medication regimen.   Restless Leg Syndrome/Pain . Pharmacist Clinical Goal(s) o Over the next 90 days, patient will work with PharmD and providers to reduce symptoms of restless leg syndrome and pain . Current regimen:  o Ropinirole 0.5mg  daily HS, Gabapentin 300mg  TID, acetaminophen 500mg  #16 throughout the Karalyn Kadel #4 Tylenol PM at night . Interventions: o Discuss medication options with Melissa  . Patient self care activities - Over the next 90 days, patient will: o  Maintain RLS/Pain regimen.   Health Maintenance . Pharmacist Clinical Goal(s) o Over the next 180 days, patient will work with PharmD and providers to Remain up to date on health maintenance  . Interventions: o Recommend patient to complete Shingrix vaccine series . Patient self care activities - Over the next 180 days, patient will: o Complete Shingrix vaccine series  Medication management . Pharmacist Clinical Goal(s): o Over the next 90 days, patient will work with PharmD and providers to achieve optimal medication adherence . Current pharmacy: [facility], switched to [facility]  . Interventions o Comprehensive medication review performed. o Utilize [facility] pharmacy for medication synchronization,  packaging and delivery . Patient self care activities - Over the next 90 days, patient will: o Focus on medication adherence by filling medications appropriately  o Take medications as prescribed o Report any questions or concerns to PharmD and/or provider(s)  Initial goal documentation       Social Hx:  Has lived in [place] since 2006. Born in [place].  Married since 1987.  Has 3 kids ages 21,31,21. Son goes to [employer]  Wife has had to use oxygen since she was dx with COVID  Asthma / Tobacco   Last spirometry score: 02/07/2012  FVC: 3.8 L (87%) FEV1: 2.9 L (83%) FEV1/FVC (96%)  Eosinophil count:   Lab Results  Component Value Date/Time   EOSPCT 0 01/20/2020 05:12 PM  %                               Eos (Absolute):  Lab Results  Component Value Date/Time   EOSABS 0.0 01/20/2020 05:12 PM    Tobacco Status:  Social History   Tobacco Use  Smoking Status Current Every Delsin Copen Smoker  . Packs/Latravion Graves: 0.50  . Years: 20.00  . Pack years: 10.00  . Types: Cigarettes  Smokeless Tobacco Never Used    Patient has failed these meds in past: None noted  Patient is currently controlled on the following medications: advair 500-50 1 puff BID, albuterol neb PRN, flovent 114mcg 2 puffs daily while in yellow zone. atrovent PRN, montelukast 10mg  daily, albuterol inhaler PRN, Chantix  Using maintenance inhaler regularly? Yes Frequency of rescue inhaler use:  1-2x per week   Cough has improved, but still persistent. Nebs: Only uses when in severe trouble. Hasn't used in 2-3 months   Smoking "Quit successfully with chantix for 8 years. But picked it back up again." per Dr. Shearon Stalls note 12/31/19 Smoking is a stress reliever. Smoking 1/2 pack per Raesha Coonrod.  Would be willing to stop smoking if he could get his pain under control.  Stopped going to pulmonology due to office location changing  We discussed:  Importance of smoking cessation  Plan -Continue current  medications  Hypertension   CMP Latest Ref Rng & Units 01/20/2020 01/20/2020 11/08/2019  Glucose 70 - 99 mg/dL - 101(H) 96  BUN 6 - 20 mg/dL - 13 11  Creatinine 0.61 - 1.24 mg/dL - 0.80 0.76  Sodium 135 - 145 mmol/L 139 137 139  Potassium 3.5 - 5.1 mmol/L 3.7 3.7 3.9  Chloride 98 - 111 mmol/L - 103 100  CO2 22 - 32 mmol/L - 25 31  Calcium 8.9 - 10.3 mg/dL - 9.0 9.3  Total Protein 6.0 - 8.3 g/dL - - -  Total Bilirubin 0.2 - 1.2 mg/dL - - -  Alkaline Phos 39 - 117 U/L - - -  AST 0 - 37 U/L - - -  ALT 0 - 53 U/L - - -  GFR     BP today is:  Unable to assess due to phone visit   Office blood pressures are  BP Readings from Last 3 Encounters:  01/22/20 127/80  01/20/20 (!) 132/92  12/31/19 130/72    Patient has failed these meds in the past: None noted  Patient is currently controlled on the following medications: amlodipine 10mg  daily, benazepril 20mg  daily  Has not had amlodipine filled since 10/30/19 #30  Patient checks BP at home Never. BP cuff died  Patient home BP readings are ranging: N/A  We discussed Importance of medication adherence  Plan -Consider purchasing new blood pressure cuff -Continue current medications   Hyperlipidemia   Lipid Panel     Component Value Date/Time   CHOL 176 01/03/2018 0436   TRIG 105 01/03/2018 0436   HDL 56 01/03/2018 0436   CHOLHDL 3.1 01/03/2018 0436   VLDL 21 01/03/2018 0436   LDLCALC 99 01/03/2018 0436   LDLDIRECT 97.0 10/11/2016 1143    LDL goal < 70 (stroke)  The ASCVD Risk score Mikey Bussing DC Jr., et al., 2013) failed to calculate for the following reasons:   The patient has a prior MI or stroke diagnosis   Patient has failed these meds in past: None noted  Patient is currently uncontrolled on the following medications: lovastatin 40mg  daily AM  Patient is agreeable to changing statin  We discussed:  LDL goal noting hx of stroke  Plan -Consider changing to high intensity statin after next lipid panel -Consider  taking lovastatin in the eveing -Continue current medications  Hypothyroidism   TSH  Date Value Ref Range Status  09/11/2019 3.49 0.35 - 4.50 uIU/mL Final     Patient has failed these meds in past: None noted  Patient is currently controlled on the following medications: levothyroxine 291mcg daily  Plan -Continue current medications  Barrett's Esophagus     Patient has failed these meds in past: None noted  Patient is currently controlled on the following medications: pantoprazole 40mg  daily  Has been out for a week  We discussed: The importance of this medication noting his diagnosis of Barrett's Esophagus  Plan -Refill pantoprazole for patient from [facility] (patient consented to switching to [facility]) -Continue current medications   BPH/ED    Patient has failed these meds in past: None noted  Patient is currently controlled on the following medications: tamsulosin 0.4mg  daily, sildenafil 20mg  PRN  Plan -Continue current medications   Osteoarthritis     Patient has failed these meds in past: None noted  Patient is currently uncontrolled on the following medications: None  There are days he can barely move.  His wife will have to pull him up out of the chair.  His physical therapy with interrupted by COVID. Patient would like to discuss other regimens to assist with pain/RLS  Plan -Discuss medications alternatives with Melissa -Continue current management  Allergic Rhinitis    Patient has failed these meds in past: None noted  Patient is currently controlled on the following medications: fluticasone 36mcg 1 spray both nostrils daily  Plan -Continue current medications   Restless Leg   04/17/18: Ferritin 64.8 ng/mL = 64.8 mcg/L  Patient has failed these meds in past:  Patient is currently uncontrolled on the following medications: ropinirole 0.5mg  daily HS, Gabapentin 300mg  TID, acetaminophen 500mg  #16 throughout the Kendalynn Wideman #4 Tylenol PM at night   "My  legs are my biggest problem other than my asthma" Was previously being treated with percocet per pain management, but was "dropped" July 2020 when his prescription came up missing.  Preferred Pain Management clinic on [address] (not seeing them anymore) "I don't have a very good outlook on my future" I want to be comfortable now. I don't think I will be here in another 2 years.  We discussed:  Possible pain options to discuss with Melissa including tramadol. We discussed the maximum dosing of acetaminophen noting patient is well over the maximum of 4000mg  daily putting himself at risk of overdose and liver damage. Pt verbalized understanding, but noted he is in severe pain.  RLS Research Checking iron studies may be considered in patients with new or worsening RLS symptoms.1  Treat the iron deficiency (e.g., serum ferritin ?75 mcg/L [170 pmol/L]) first, before trying other medications for RLS.1  1 -Rachael Darby, Armstrong MJ, [employer], et al. Practice guideline summary: treatment of restless legs syndrome in adults: reports of the guideline development, dissemination, and implementation subcommittee of the American Academy of Neurology. Neurology [phone].  Iron treatments. Ferrous sulfate (oral). It is likely that ferrous sulfate 325 mg with vitamin C 200 mg taken twice daily improves RLS symptoms in patients with serum ferritin ?75 ?g/L (1 Class I studye1).  IV iron. IV ferric carboxymaltose (FCM) 500 mg given twice 5 days apart likely improves RLS symptoms in patients with moderate to severe RLS regardless of ferritin level (1 Class I studye2). In this population, IV FCM likely improves RLS-specific QoL at 28 days after initial treatment (1 Class I studye2). There is insufficient evidence to support or refute an effect of IV FCM on subjective sleep measures or PLMI (1 Class I studye2 without statistical significance but with CIs including potentially clinically important effects).  Studies investigating iron sucrose use in RLS had insufficient precision to support or refute a treatment effect (2 Class II studiese3,e4 did not reach statistical significance but had CIs including clinically important effects).  Plan -Discuss medication options with Melissa that could assist with RLS -Continue current medications   Medication Options -Titrate ropinirole (max dose 3-4mg  daily; can titrate by 0.5mg  weekly) -Start ferrous sulfate 325mg  with vitamin C 200mg  twice daily -Consider tramadol or other pain meds as agreeable with Melissa (last line)  Polycystic Kidney Disease   Patient has failed these meds in past: None noted  Patient is currently controlled on the following medications: None   Was taking an experimental medication Last nephro appt on 10/2019 (through [facility]) Sister and Father have died from kidney disease  We discussed:  Avoiding nephrotoxic drugs  Plan -Continue avoidance of nephrotoxic drugs  Vaccines   Reviewed and discussed patient's vaccination history.    Immunization History  Administered Date(s) Administered  . Influenza Split 09/26/2011, 09/19/2012  . Influenza,inj,Quad PF,6+ Mos 09/25/2013, 08/19/2014, 09/09/2015, 10/11/2016, 09/11/2019  . Pneumococcal Conjugate-13 12/26/2008  . Pneumococcal Polysaccharide-23 11/09/2016  . Tdap 05/02/2014, 08/18/2017   Received J&J COVID Vaccine on 03/30/20  Plan -Recommended patient receive Shingrix vaccine in pharmacy.    Miscellaneous Concerns Ear aches and headaches due to dental problems   Miscellaneous Meds Docusate Lotrisone (sore on edge of mouth) Cyclobenzaprine Eliquis Furosemide (about 2 times per week)  Meds to D/C from list Azithromycin Benzonatate Eliquis  Docusate flonase (pt does not like; has polyps in his nose and he does not like nasal)  Verbal consent obtained for [facility] enhanced pharmacy services (medication synchronization, adherence packaging,  delivery coordination). A medication sync plan was created to allow patient to get all medications delivered once every 30 to 90 days per patient preference. Patient understands they have freedom to choose pharmacy and clinical pharmacist will coordinate care between all prescribers and [facility].

## 2020-04-24 ENCOUNTER — Other Ambulatory Visit: Payer: Self-pay

## 2020-04-24 DIAGNOSIS — K219 Gastro-esophageal reflux disease without esophagitis: Secondary | ICD-10-CM

## 2020-04-24 MED ORDER — GABAPENTIN 300 MG PO CAPS: ORAL_CAPSULE | ORAL | 5 refills | Status: DC

## 2020-04-24 MED ORDER — PANTOPRAZOLE SODIUM 40 MG PO TBEC: 40.0000 mg | DELAYED_RELEASE_TABLET | Freq: Every day | ORAL | 0 refills | Status: DC

## 2020-04-24 MED ORDER — AMLODIPINE BESYLATE 10 MG PO TABS: 10.0000 mg | ORAL_TABLET | Freq: Every day | ORAL | 5 refills | Status: DC

## 2020-04-24 MED ORDER — MONTELUKAST SODIUM 10 MG PO TABS: ORAL_TABLET | ORAL | 1 refills | Status: DC

## 2020-05-01 ENCOUNTER — Other Ambulatory Visit: Payer: Self-pay

## 2020-05-01 MED ORDER — ADVAIR DISKUS 500-50 MCG/DOSE IN AEPB: INHALATION_SPRAY | RESPIRATORY_TRACT | 3 refills | Status: DC

## 2020-05-01 MED ORDER — ROPINIROLE HCL 0.5 MG PO TABS: 0.5000 mg | ORAL_TABLET | Freq: Every day | ORAL | 5 refills | Status: DC

## 2020-05-04 ENCOUNTER — Other Ambulatory Visit: Payer: Self-pay

## 2020-05-04 MED ORDER — BENAZEPRIL HCL 20 MG PO TABS: 20.0000 mg | ORAL_TABLET | Freq: Every day | ORAL | 1 refills | Status: DC

## 2020-05-05 NOTE — Patient Instructions (Addendum)
Visit Information  Goals Addressed            This Visit's Progress   . Pharmacy Care Plan       CARE PLAN ENTRY  Current Barriers:  . Chronic Disease Management support, education, and care coordination needs related to Asthma, Hypertension, Hyperlipidemia, Hypothyroidism, Barrett's Esophagus, BPH, Osteoarthritis, Allergic Rhinitis, Restless Leg Syndrome, Polycystic Kidney Disease   Hypertension . Pharmacist Clinical Goal(s): o Over the next 90 days, patient will work with PharmD and providers to maintain BP goal <140/90 . Current regimen:  o Amlodipine 10mg  daily, benazepril 20mg  daily . Interventions: o Consider purchasing new blood pressure cuff . Patient self care activities - Over the next 90 days, patient will: o Check BP 2-3 times per week, document, and provide at future appointments o Ensure daily salt intake < 2300 mg/Kyle Blair  Hyperlipidemia . Pharmacist Clinical Goal(s): o Over the next 90 days, patient will work with PharmD and providers to achieve LDL goal <70 . Current regimen:  o Lovastatin 40mg  daily . Interventions: o Consider completing lipid panel at next visit o Consider changing statin to high intensity statin to help reach LDL goal . Patient self care activities - Over the next 90 days, patient will: o Maintain cholesterol medication regimen.   Restless Leg Syndrome/Pain . Pharmacist Clinical Goal(s) o Over the next 90 days, patient will work with PharmD and providers to reduce symptoms of restless leg syndrome and pain . Current regimen:  o Ropinirole 0.5mg  daily HS, Gabapentin 300mg  TID, acetaminophen 500mg  #16 throughout the Kyle Blair #4 Tylenol PM at night . Interventions: o Discuss medication options with Melissa  . Patient self care activities - Over the next 90 days, patient will: o Maintain RLS/Pain regimen.   Health Maintenance . Pharmacist Clinical Goal(s) o Over the next 180 days, patient will work with PharmD and providers to Remain up to date  on health maintenance  . Interventions: o Recommend patient to complete Shingrix vaccine series . Patient self care activities - Over the next 180 days, patient will: o Complete Shingrix vaccine series  Medication management . Pharmacist Clinical Goal(s): o Over the next 90 days, patient will work with PharmD and providers to achieve optimal medication adherence . Current pharmacy: [facility], switched to [facility]  . Interventions o Comprehensive medication review performed. o Utilize [facility] pharmacy for medication synchronization, packaging and delivery . Patient self care activities - Over the next 90 days, patient will: o Focus on medication adherence by filling medications appropriately  o Take medications as prescribed o Report any questions or concerns to PharmD and/or provider(s)  Initial goal documentation        Kyle Blair was given information about Chronic Care Management services today including:  1. CCM service includes personalized support from designated clinical staff supervised by his physician, including individualized plan of care and coordination with other care providers 2. 24/7 contact phone numbers for assistance for urgent and routine care needs. 3. Standard insurance, coinsurance, copays and deductibles apply for chronic care management only during months in which we provide at least 20 minutes of these services. Most insurances cover these services at 100%, however patients may be responsible for any copay, coinsurance and/or deductible if applicable. This service may help you avoid the need for more expensive face-to-face services. 4. Only one practitioner may furnish and bill the service in a calendar month. 5. The patient may stop CCM services at any time (effective at the end of the month) by  phone call to the office staff.  Patient agreed to services and verbal consent obtained.   The patient verbalized understanding of instructions provided  today and agreed to receive a mailed copy of patient instruction and/or educational materials. Telephone follow up appointment with pharmacy team member scheduled for: 05/08/2020  Melvenia Beam Levette Paulick, PharmD Clinical Pharmacist [facility] [phone]    Restless Legs Syndrome Restless legs syndrome is a condition that causes uncomfortable feelings or sensations in the legs, especially while sitting or lying down. The sensations usually cause an overwhelming urge to move the legs. The arms can also sometimes be affected. The condition can range from mild to severe. The symptoms often interfere with a person's ability to sleep. What are the causes? The cause of this condition is not known. What increases the risk? The following factors may make you more likely to develop this condition:  Being older than 50.  Pregnancy.  Being a woman. In general, the condition is more common in women than in men.  A family history of the condition.  Having iron deficiency.  Overuse of caffeine, nicotine, or alcohol.  Certain medical conditions, such as kidney disease, Parkinson's disease, or nerve damage.  Certain medicines, such as those for high blood pressure, nausea, colds, allergies, depression, and some heart conditions. What are the signs or symptoms? The main symptom of this condition is uncomfortable sensations in the legs, such as:  Pulling.  Tingling.  Prickling.  Throbbing.  Crawling.  Burning. Usually, the sensations:  Affect both sides of the body.  Are worse when you sit or lie down.  Are worse at night. These may wake you up or make it difficult to fall asleep.  Make you have a strong urge to move your legs.  Are temporarily relieved by moving your legs. The arms can also be affected, but this is rare. People who have this condition often have tiredness during the Kyle Blair because of their lack of sleep at night. How is this  diagnosed? This condition may be diagnosed based on:  Your symptoms.  Blood tests. In some cases, you may be monitored in a sleep lab by a specialist (a sleep study). This can detect any disruptions in your sleep. How is this treated? This condition is treated by managing the symptoms. This may include:  Lifestyle changes, such as exercising, using relaxation techniques, and avoiding caffeine, alcohol, or tobacco.  Medicines. Anti-seizure medicines may be tried first. Follow these instructions at home:     General instructions  Take over-the-counter and prescription medicines only as told by your health care provider.  Use methods to help relieve the uncomfortable sensations, such as: ? Massaging your legs. ? Walking or stretching. ? Taking a cold or hot bath.  Keep all follow-up visits as told by your health care provider. This is important. Lifestyle  Practice good sleep habits. For example, go to bed and get up at the same time every Kyle Blair. Most adults should get 7-9 hours of sleep each night.  Exercise regularly. Try to get at least 30 minutes of exercise most days of the week.  Practice ways of relaxing, such as yoga or meditation.  Avoid caffeine and alcohol.  Do not use any products that contain nicotine or tobacco, such as cigarettes and e-cigarettes. If you need help quitting, ask your health care provider. Contact a health care provider if:  Your symptoms get worse or they do not improve with treatment. Summary  Restless legs  syndrome is a condition that causes uncomfortable feelings or sensations in the legs, especially while sitting or lying down.  The symptoms often interfere with a person's ability to sleep.  This condition is treated by managing the symptoms. You may need to make lifestyle changes or take medicines. This information is not intended to replace advice given to you by your health care provider. Make sure you discuss any questions you have  with your health care provider. Document Revised: 01/01/2018 Document Reviewed: 01/01/2018 Elsevier Patient Education  [place].

## 2020-05-06 ENCOUNTER — Telehealth: Payer: Self-pay | Admitting: Family

## 2020-05-06 NOTE — Telephone Encounter (Signed)
Please contact pt to schedule follow up with me.  

## 2020-05-08 ENCOUNTER — Telehealth: Payer: Medicare HMO

## 2020-05-08 ENCOUNTER — Ambulatory Visit: Payer: Self-pay | Admitting: Pharmacist

## 2020-05-08 NOTE — Chronic Care Management (AMB) (Signed)
  Chronic Care Management   Outreach Note  05/08/2020 Name: Kyle Blair MRN: [id number] DOB: 06/02/1961  Referred by: [name], NP Reason for referral : No chief complaint on file.   A second unsuccessful telephone outreach was attempted today. The patient was referred to pharmacist for assistance with care management and care coordination.   Called at 2:40pm and 2:57pm for appt scheduled for today. Left VM for patient to return my call.  Reviewed and updated patient's chart including assuring all medications appropriately filled with [facility].  Follow Up Plan:  -Will try to contact patient next month for follow up  De Blanch, PharmD Clinical Pharmacist [facility] [phone]

## 2020-05-08 NOTE — Telephone Encounter (Signed)
Called left message on machine for [employer]

## 2020-05-08 NOTE — Chronic Care Management (AMB) (Deleted)
Chronic Care Management Pharmacy  Name: Kyle Blair  MRN: [id number] DOB: 03-04-1961  Chief Complaint/ HPI  Kyle Blair,  59 y.o. , male presents for their Initial CCM visit with the clinical pharmacist via telephone due to COVID-19 Pandemic.  PCP : [name], NP  Their chronic conditions include: Asthma, Hypertension, Hyperlipidemia, Hypothyroidism, Barrett's Esophagus, BPH, Osteoarthritis, Allergic Rhinitis, Restless Leg Syndrome, Polycystic Kidney Disease  Office Visits: 02/12/20: Visit w/ [name], NP - Persistent cough since COVID 19 infection. Pt tested positive in mid January. CXR ordered (negative). Recommended pt to follow up with pulmonology and stop smoking.  02/10/20: Patient call stating atrovent more helpful than albuterol for him  02/03/20: Visit w/ [name], NP - Persistent cough. Last seen on 01/28/20 and treated with azithromycin and mucinex. Added tessalon at this visit.  01/28/20: Visit w/ [name], NP - Visit post covid infection. Prescribed azithromycin. Advised to continue mucinex prn with humidifier and hydration.   01/14/20: Visit w/ Mackie Pai, PA-C - COVID exposure with SOB. Advised to go to ED, but pt refused. Prescribed azithromycin, atrovent, and 6 Malaika Arnall prednisone taper  01/01/20: Visit w/ [name], NP - Pt requested steroid in case of worsening symptoms. Risk expressed to patient and advised to use only if symptoms worsened.   Consult Visit: 12/31/19: Pulmonology visit w/ Dr. Shearon Stalls - Asthma worsened. Smoking cessation discussed in detail. Pt agreeable to trying Chantix.   ED Visit:  01/20/20: [facility] - SOB while COVID Positive (dx on 01/16/20). Scheduled for first antibody infusion in 2 days. Pt given albuterol puffs and IV solumedrol and IV magnesium. Pt clinically improved after treatment  Medications: Outpatient Encounter Medications as of 05/08/2020  Medication Sig Note  .  acetaminophen (TYLENOL) 325 MG tablet Take 1-2 tablets (325-650 mg total) by mouth every 6 (six) hours as needed for mild pain or moderate pain.   Marland Kitchen ADVAIR DISKUS 500-50 MCG/DOSE AEPB USE ONE INHALATION TWICE A Clementine Soulliere.   Marland Kitchen albuterol (PROVENTIL) (2.5 MG/3ML) 0.083% nebulizer solution USE 1 VIAL VIA NEBULIZER EVERY 6 HOURS AS NEEDED FOR WHEEZING OR SHORTNESS OF BREATH   . amLODipine (NORVASC) 10 MG tablet Take 1 tablet (10 mg total) by mouth daily.   Marland Kitchen apixaban (ELIQUIS) 2.5 MG TABS tablet Take 1 tablet (2.5 mg total) by mouth 2 (two) times daily. (Patient not taking: Reported on 04/20/2020) 04/20/2020: Was given after knee surgery  . azithromycin (ZITHROMAX) 250 MG tablet Take 2 tabs by mouth today, then one tab once daily for 4 more days. (Patient not taking: Reported on 04/20/2020)   . benazepril (LOTENSIN) 20 MG tablet Take 1 tablet (20 mg total) by mouth daily.   . benzonatate (TESSALON) 100 MG capsule Take 2 capsules (200 mg total) by mouth 3 (three) times daily as needed for cough. (Patient not taking: Reported on 04/20/2020)   . clotrimazole-betamethasone (LOTRISONE) cream Apply 1 application topically 2 (two) times daily.   . cyclobenzaprine (FLEXERIL) 5 MG tablet Take 1 tablet (5 mg total) by mouth 2 (two) times daily as needed for muscle spasms.   Marland Kitchen docusate sodium (COLACE) 100 MG capsule Take 1 capsule (100 mg total) by mouth daily as needed for mild constipation. (Patient not taking: Reported on 04/20/2020)   . fluticasone (FLONASE) 50 MCG/ACT nasal spray Place 1 spray into both nostrils daily. (Patient not taking: Reported on 04/20/2020)   . fluticasone (FLOVENT HFA) 110 MCG/ACT inhaler INHALE 2 PUFFS INTO THE LUNGS DAILY WHILE IN YOUR  YELLOW ZONE. RINSE MOUTH AND SPIT OUT AFTER USE.   . furosemide (LASIX) 20 MG tablet Take 1 tablet (20 mg total) by mouth daily as needed.   . gabapentin (NEURONTIN) 300 MG capsule TAKE 1 CAPSULE BY MOUTH THREE TIMES A Xareni Kelch   . ipratropium (ATROVENT HFA) 17 MCG/ACT  inhaler Inhale 2 puffs into the lungs every 6 (six) hours as needed for wheezing.   Marland Kitchen levothyroxine (SYNTHROID) 200 MCG tablet TAKE ONE TABLET BY MOUTH EVERY MORNING BEFORE BREAKFAST   . lovastatin (MEVACOR) 40 MG tablet TAKE ONE TABLET BY MOUTH EVERY NIGHT AT BEDTIME   . montelukast (SINGULAIR) 10 MG tablet TAKE ONE TABLET EVERY MORNING   . pantoprazole (PROTONIX) 40 MG tablet Take 1 tablet (40 mg total) by mouth daily. MUST HAVE OFFICE VISIT FOR FURTHER REFILLS   . rOPINIRole (REQUIP) 0.5 MG tablet Take 1 tablet (0.5 mg total) by mouth at bedtime.   . sildenafil (REVATIO) 20 MG tablet Take 1 to 2 tablets by mouth 30 minutes prior to sexual activity.   . tamsulosin (FLOMAX) 0.4 MG CAPS capsule TAKE ONE CAPSULE BY MOUTH DAILY   . varenicline (CHANTIX PAK) 0.5 MG X 11 & 1 MG X 42 tablet Take one 0.5 mg tablet by mouth once daily for 3 days, then increase to one 0.5 mg tablet twice daily for 4 days, then increase to one 1 mg tablet twice daily.   . VENTOLIN HFA 108 (90 Base) MCG/ACT inhaler INHALE 2 PUFFS INTO THE LUNGS EVERY 4 HOURS AS NEEDED FOR WHEEZING OR SHORTNESS OF BREATH    No facility-administered encounter medications on file as of 05/08/2020.     Current Diagnosis/Assessment:  Goals Addressed   None    Social Hx:  Has lived in [place] since 2006. Born in [place].  Married since 1987.  Has 3 kids ages 73,31,21. Son goes to [employer]  Wife has had to use oxygen since she was dx with COVID  Asthma / Tobacco   Last spirometry score: 02/07/2012  FVC: 3.8 L (87%) FEV1: 2.9 L (83%) FEV1/FVC (96%)  Eosinophil count:   Lab Results  Component Value Date/Time   EOSPCT 0 01/20/2020 05:12 PM  %                               Eos (Absolute):  Lab Results  Component Value Date/Time   EOSABS 0.0 01/20/2020 05:12 PM    Tobacco Status:  Social History   Tobacco Use  Smoking Status Current Every Tiny Rietz Smoker  . Packs/Makya Yurko: 0.50  . Years: 20.00  . Pack  years: 10.00  . Types: Cigarettes  Smokeless Tobacco Never Used    Patient has failed these meds in past: None noted  Patient is currently controlled on the following medications: advair 500-50 1 puff BID, albuterol neb PRN, flovent 136mcg 2 puffs daily while in yellow zone. atrovent PRN, montelukast 10mg  daily, albuterol inhaler PRN, Chantix  Using maintenance inhaler regularly? Yes Frequency of rescue inhaler use:  1-2x per week   Cough has improved, but still persistent. Nebs: Only uses when in severe trouble. Hasn't used in 2-3 months   Smoking "Quit successfully with chantix for 8 years. But picked it back up again." per Dr. Shearon Stalls note 12/31/19 Smoking is a stress reliever. Smoking 1/2 pack per Albino Bufford.  Would be willing to stop smoking if he could get his pain under control.  Stopped going to  pulmonology due to office location changing  We discussed:  Importance of smoking cessation  Plan -Continue current medications  Hypertension   CMP Latest Ref Rng & Units 01/20/2020 01/20/2020 11/08/2019  Glucose 70 - 99 mg/dL - 101(H) 96  BUN 6 - 20 mg/dL - 13 11  Creatinine 0.61 - 1.24 mg/dL - 0.80 0.76  Sodium 135 - 145 mmol/L 139 137 139  Potassium 3.5 - 5.1 mmol/L 3.7 3.7 3.9  Chloride 98 - 111 mmol/L - 103 100  CO2 22 - 32 mmol/L - 25 31  Calcium 8.9 - 10.3 mg/dL - 9.0 9.3  Total Protein 6.0 - 8.3 g/dL - - -  Total Bilirubin 0.2 - 1.2 mg/dL - - -  Alkaline Phos 39 - 117 U/L - - -  AST 0 - 37 U/L - - -  ALT 0 - 53 U/L - - -  GFR     BP today is:  Unable to assess due to phone visit   Office blood pressures are  BP Readings from Last 3 Encounters:  01/22/20 127/80  01/20/20 (!) 132/92  12/31/19 130/72    Patient has failed these meds in the past: None noted  Patient is currently controlled on the following medications: amlodipine 10mg  daily, benazepril 20mg  daily  Has not had amlodipine filled since 10/30/19 #30  Patient checks BP at home Never. BP cuff died  Patient  home BP readings are ranging: N/A  We discussed Importance of medication adherence  Plan -Consider purchasing new blood pressure cuff -Continue current medications   Hyperlipidemia   Lipid Panel     Component Value Date/Time   CHOL 176 01/03/2018 0436   TRIG 105 01/03/2018 0436   HDL 56 01/03/2018 0436   CHOLHDL 3.1 01/03/2018 0436   VLDL 21 01/03/2018 0436   LDLCALC 99 01/03/2018 0436   LDLDIRECT 97.0 10/11/2016 1143    LDL goal < 70 (stroke)  The ASCVD Risk score Mikey Bussing DC Jr., et al., 2013) failed to calculate for the following reasons:   The patient has a prior MI or stroke diagnosis   Patient has failed these meds in past: None noted  Patient is currently uncontrolled on the following medications: lovastatin 40mg  daily AM  Patient is agreeable to changing statin  We discussed:  LDL goal noting hx of stroke  Plan -Consider changing to high intensity statin after next lipid panel -Consider taking lovastatin in the eveing -Continue current medications  Hypothyroidism   TSH  Date Value Ref Range Status  09/11/2019 3.49 0.35 - 4.50 uIU/mL Final     Patient has failed these meds in past: None noted  Patient is currently controlled on the following medications: levothyroxine 211mcg daily  Plan -Continue current medications  Barrett's Esophagus     Patient has failed these meds in past: None noted  Patient is currently controlled on the following medications: pantoprazole 40mg  daily  Has been out for a week  We discussed: The importance of this medication noting his diagnosis of Barrett's Esophagus  Plan -Refill pantoprazole for patient from [facility] (patient consented to switching to [facility]) -Continue current medications   BPH/ED    Patient has failed these meds in past: None noted  Patient is currently controlled on the following medications: tamsulosin 0.4mg  daily, sildenafil 20mg  PRN  Plan -Continue current medications   Osteoarthritis      Patient has failed these meds in past: None noted  Patient is currently uncontrolled on the following medications: None  There  are days he can barely move.  His wife will have to pull him up out of the chair.  His physical therapy with interrupted by COVID. Patient would like to discuss other regimens to assist with pain/RLS  Plan -Discuss medications alternatives with Melissa -Continue current management  Allergic Rhinitis    Patient has failed these meds in past: None noted  Patient is currently controlled on the following medications: fluticasone 52mcg 1 spray both nostrils daily  Plan -Continue current medications   Restless Leg   04/17/18: Ferritin 64.8 ng/mL = 64.8 mcg/L  Patient has failed these meds in past:  Patient is currently uncontrolled on the following medications: ropinirole 0.5mg  daily HS, Gabapentin 300mg  TID, acetaminophen 500mg  #16 throughout the Kitti Mcclish #4 Tylenol PM at night   "My legs are my biggest problem other than my asthma" Was previously being treated with percocet per pain management, but was "dropped" July 2020 when his prescription came up missing.  Preferred Pain Management clinic on [address] (not seeing them anymore) "I don't have a very good outlook on my future" I want to be comfortable now. I don't think I will be here in another 2 years.  We discussed:  Possible pain options to discuss with Melissa including tramadol. We discussed the maximum dosing of acetaminophen noting patient is well over the maximum of 4000mg  daily putting himself at risk of overdose and liver damage. Pt verbalized understanding, but noted he is in severe pain.  RLS Research Checking iron studies may be considered in patients with new or worsening RLS symptoms.1  Treat the iron deficiency (e.g., serum ferritin ?75 mcg/L [170 pmol/L]) first, before trying other medications for RLS.1  1 -Rachael Darby, Armstrong MJ, [employer], et al. Practice guideline summary:  treatment of restless legs syndrome in adults: reports of the guideline development, dissemination, and implementation subcommittee of the American Academy of Neurology. Neurology [phone].  Iron treatments. Ferrous sulfate (oral). It is likely that ferrous sulfate 325 mg with vitamin C 200 mg taken twice daily improves RLS symptoms in patients with serum ferritin ?75 ?g/L (1 Class I studye1).  IV iron. IV ferric carboxymaltose (FCM) 500 mg given twice 5 days apart likely improves RLS symptoms in patients with moderate to severe RLS regardless of ferritin level (1 Class I studye2). In this population, IV FCM likely improves RLS-specific QoL at 28 days after initial treatment (1 Class I studye2). There is insufficient evidence to support or refute an effect of IV FCM on subjective sleep measures or PLMI (1 Class I studye2 without statistical significance but with CIs including potentially clinically important effects). Studies investigating iron sucrose use in RLS had insufficient precision to support or refute a treatment effect (2 Class II studiese3,e4 did not reach statistical significance but had CIs including clinically important effects).  Plan -Discuss medication options with Melissa that could assist with RLS -Continue current medications   Medication Options -Titrate ropinirole (max dose 3-4mg  daily; can titrate by 0.5mg  weekly) -Start ferrous sulfate 325mg  with vitamin C 200mg  twice daily -Consider tramadol or other pain meds as agreeable with Melissa (last line)  Polycystic Kidney Disease   Patient has failed these meds in past: None noted  Patient is currently controlled on the following medications: None   Was taking an experimental medication Last nephro appt on 10/2019 (through [facility]) Sister and Father have died from kidney disease  We discussed:  Avoiding nephrotoxic drugs  Plan -Continue avoidance of nephrotoxic drugs  Vaccines   Reviewed  and  discussed patient's vaccination history.    Immunization History  Administered Date(s) Administered  . Influenza Split 09/26/2011, 09/19/2012  . Influenza,inj,Quad PF,6+ Mos 09/25/2013, 08/19/2014, 09/09/2015, 10/11/2016, 09/11/2019  . Pneumococcal Conjugate-13 12/26/2008  . Pneumococcal Polysaccharide-23 11/09/2016  . Tdap 05/02/2014, 08/18/2017   Received J&J COVID Vaccine on 03/30/20  Plan -Recommended patient receive Shingrix vaccine in pharmacy.    Miscellaneous Concerns Ear aches and headaches due to dental problems   Miscellaneous Meds Docusate Lotrisone (sore on edge of mouth) Cyclobenzaprine Furosemide (about 2 times per week)  Meds to D/C from list Azithromycin Benzonatate Eliquis  Docusate flonase (pt does not like; has polyps in his nose and he does not like nasal)  Verbal consent obtained for [facility] enhanced pharmacy services (medication synchronization, adherence packaging, delivery coordination). A medication sync plan was created to allow patient to get all medications delivered once every 30 to 90 days per patient preference. Patient understands they have freedom to choose pharmacy and clinical pharmacist will coordinate care between all prescribers and [facility].

## 2020-05-21 ENCOUNTER — Telehealth: Payer: Self-pay | Admitting: Family

## 2020-05-21 MED FILL — FLOVENT HFA 110 MCG INHALER: 110 | 30 days supply | Qty: 12 | Fill #1

## 2020-05-21 NOTE — Telephone Encounter (Signed)
Medication: levothyroxine (SYNTHROID) 200 MCG tablet   Has the patient contacted their pharmacy? No. (If no, request that the patient contact the pharmacy for the refill.) (If yes, when and what did the pharmacy advise?)  Preferred Pharmacy (with phone number or street name): [facility] - [place], [place] - [address] Phone:  [phone]  Fax:  [phone]        Agent: Please be advised that RX refills may take up to 3 business days. We ask that you follow-up with your pharmacy.

## 2020-05-22 ENCOUNTER — Other Ambulatory Visit: Payer: Self-pay

## 2020-05-22 MED ORDER — CYCLOBENZAPRINE HCL 5 MG PO TABS: 5.0000 mg | ORAL_TABLET | Freq: Two times a day (BID) | ORAL | 1 refills | Status: DC | PRN

## 2020-05-22 MED ORDER — ATROVENT HFA 17 MCG/ACT IN AERS: 2.0000 | INHALATION_SPRAY | Freq: Four times a day (QID) | RESPIRATORY_TRACT | 1 refills | Status: DC | PRN

## 2020-05-22 MED ORDER — SILDENAFIL CITRATE 20 MG PO TABS: ORAL_TABLET | ORAL | 1 refills | Status: DC

## 2020-05-22 MED ORDER — LEVOTHYROXINE SODIUM 200 MCG PO TABS: ORAL_TABLET | ORAL | 1 refills | Status: DC

## 2020-05-22 MED ORDER — ALBUTEROL SULFATE (2.5 MG/3ML) 0.083% IN NEBU: INHALATION_SOLUTION | RESPIRATORY_TRACT | 1 refills | Status: DC

## 2020-05-22 MED ORDER — VENTOLIN HFA 108 (90 BASE) MCG/ACT IN AERS: INHALATION_SPRAY | RESPIRATORY_TRACT | 1 refills | Status: DC

## 2020-05-22 NOTE — Telephone Encounter (Signed)
Prescription sent earlier today

## 2020-06-04 ENCOUNTER — Other Ambulatory Visit: Payer: Self-pay | Admitting: Family

## 2020-06-04 ENCOUNTER — Ambulatory Visit: Payer: Self-pay | Admitting: Pharmacist

## 2020-06-04 MED ORDER — TAMSULOSIN HCL 0.4 MG PO CAPS: ORAL_CAPSULE | ORAL | 1 refills | Status: DC

## 2020-06-04 NOTE — Chronic Care Management (AMB) (Signed)
Patient called me, and I returned his call.   Patient required significant coordination to get his medications sent to the pharmacy. He now also need tamsulosin sent to the pharmacy for delivery on 06/05/20. He will also need Lovastatin sent in to [facility] for future fill to be synched for adherence packaging. Will coordinate to have this sent in.   Patient is concerned with his wife's health. Rescheduled appt with his wife and encouraged him to have her evaluated by [facility].

## 2020-06-05 ENCOUNTER — Other Ambulatory Visit: Payer: Self-pay

## 2020-06-05 MED ORDER — LOVASTATIN 40 MG PO TABS: 40.0000 mg | ORAL_TABLET | Freq: Every day | ORAL | 1 refills | Status: DC

## 2020-07-01 ENCOUNTER — Ambulatory Visit (INDEPENDENT_AMBULATORY_CARE_PROVIDER_SITE_OTHER): Payer: Medicare HMO | Admitting: Family Medicine

## 2020-07-01 ENCOUNTER — Encounter: Payer: Self-pay | Admitting: Family Medicine

## 2020-07-01 ENCOUNTER — Other Ambulatory Visit: Payer: Self-pay

## 2020-07-01 VITALS — BP 144/98 | HR 96 | Temp 98.2°F | Ht 67.0 in | Wt 236.5 lb

## 2020-07-01 DIAGNOSIS — J45901 Unspecified asthma with (acute) exacerbation: Secondary | ICD-10-CM | POA: Diagnosis not present

## 2020-07-01 MED ORDER — METHYLPREDNISOLONE ACETATE 80 MG/ML IJ SUSP
80.0000 mg | Freq: Once | INTRAMUSCULAR | Status: AC
  Administered 2020-07-01: 80 mg via INTRAMUSCULAR

## 2020-07-01 MED ORDER — PREDNISONE 20 MG PO TABS: 40.0000 mg | ORAL_TABLET | Freq: Every day | ORAL | 0 refills | Status: AC

## 2020-07-01 MED FILL — predniSONE 20 MG TABS: 20 | 5 days supply | Qty: 10 | Fill #0

## 2020-07-01 NOTE — Patient Instructions (Signed)
Use rescue inhaler every 2 hrs as needed. Use your nebulizer every 4 hours scheduled until your exacerbation is done. No need to wake up to use these unless you need it.  Continue your inhalers.  Let us know if you need anything.

## 2020-07-01 NOTE — Progress Notes (Signed)
Chief Complaint  Patient presents with   Asthma    Subjective: Patient is a 59 y.o. male here for asthma exacerbation.  Son got sick a couple weeks ago, 6 d ago, his asthma flared.  He is unsure if it is related to his son or if it was related to the weather changing.  Compliant w inhalers, no AE's. +sob, cough, wheezing. No ST, fevers, runny/stuffy nose, ear complaints. Has done well w pred + injection in past.   Past Medical History:  Diagnosis Date   Arthritis    severe; pt takes strong pain meds daily/legs   Asthma    MONTH AGO   BPH (benign prostatic hypertrophy)    Diverticulosis    GERD with stricture    Hiatal hernia    History of stroke 2004   Hyperlipidemia    Hypertension    Hypothyroid    Lung nodule 02/03/2012   Osteoarthritis of left knee    Polycystic kidney disease    Stroke ([place])    2004   EQULIBRIUM, AND JUDGING DISTANCE   Tubular adenoma of colon     Objective: BP (!) 144/98 (BP Location: Left Arm, Patient Position: Sitting, Cuff Size: Normal)    Pulse 96    Temp 98.2 F (36.8 C) (Oral)    Ht 5\' 7"  (1.702 m)    Wt 236 lb 8 oz (107.3 kg)    SpO2 97%    BMI 37.04 kg/m  General: Awake, appears stated age HEENT: MMM, EOMi, ears negative, nares patent without rhinorrhea Heart: RRR Lungs: Faint expiratory wheezes heard most prominently at the bases. No accessory muscle use Psych: Age appropriate judgment and insight, normal affect and mood  Assessment and Plan: Asthma with acute exacerbation, unspecified asthma severity, unspecified whether persistent - Plan: predniSONE (DELTASONE) 20 MG tablet  Depo shot today, start 40 mg daily prednisone tomorrow for 5 days.  Continue inhalers.  Albuterol inhaler every 2 hours as needed.  DuoNeb every 4 hours scheduled, no need to wake up to use this.  Continue this until exacerbation resolved. Follow-up as needed. The patient voiced understanding and agreement to the plan.  [place],  DO 07/01/20  11:53 AM

## 2020-07-03 ENCOUNTER — Telehealth: Payer: Self-pay | Admitting: Family

## 2020-07-03 NOTE — Telephone Encounter (Signed)
error 

## 2020-07-04 ENCOUNTER — Emergency Department (HOSPITAL_BASED_OUTPATIENT_CLINIC_OR_DEPARTMENT_OTHER): Payer: Medicare HMO

## 2020-07-04 ENCOUNTER — Emergency Department (HOSPITAL_BASED_OUTPATIENT_CLINIC_OR_DEPARTMENT_OTHER)
Admission: EM | Admit: 2020-07-04 | Discharge: 2020-07-04 | Disposition: A | Discharge status: Home / Self Care | Payer: Medicare HMO | Attending: Emergency Medicine | Admitting: Emergency Medicine

## 2020-07-04 ENCOUNTER — Other Ambulatory Visit: Payer: Self-pay

## 2020-07-04 ENCOUNTER — Encounter (HOSPITAL_BASED_OUTPATIENT_CLINIC_OR_DEPARTMENT_OTHER): Payer: Self-pay | Admitting: Emergency Medicine

## 2020-07-04 DIAGNOSIS — F17210 Nicotine dependence, cigarettes, uncomplicated: Secondary | ICD-10-CM | POA: Diagnosis not present

## 2020-07-04 DIAGNOSIS — M25552 Pain in left hip: Secondary | ICD-10-CM

## 2020-07-04 DIAGNOSIS — Z91010 Allergy to peanuts: Secondary | ICD-10-CM | POA: Insufficient documentation

## 2020-07-04 DIAGNOSIS — Z96652 Presence of left artificial knee joint: Secondary | ICD-10-CM | POA: Diagnosis not present

## 2020-07-04 DIAGNOSIS — W182XXA Fall in (into) shower or empty bathtub, initial encounter: Secondary | ICD-10-CM | POA: Insufficient documentation

## 2020-07-04 DIAGNOSIS — Y92012 Bathroom of single-family (private) house as the place of occurrence of the external cause: Secondary | ICD-10-CM | POA: Insufficient documentation

## 2020-07-04 DIAGNOSIS — S300XXA Contusion of lower back and pelvis, initial encounter: Secondary | ICD-10-CM | POA: Diagnosis not present

## 2020-07-04 DIAGNOSIS — T148XXA Other injury of unspecified body region, initial encounter: Secondary | ICD-10-CM

## 2020-07-04 DIAGNOSIS — M1612 Unilateral primary osteoarthritis, left hip: Secondary | ICD-10-CM | POA: Diagnosis not present

## 2020-07-04 DIAGNOSIS — E039 Hypothyroidism, unspecified: Secondary | ICD-10-CM | POA: Diagnosis not present

## 2020-07-04 DIAGNOSIS — Y998 Other external cause status: Secondary | ICD-10-CM | POA: Insufficient documentation

## 2020-07-04 DIAGNOSIS — R69 Illness, unspecified: Secondary | ICD-10-CM | POA: Diagnosis not present

## 2020-07-04 DIAGNOSIS — I10 Essential (primary) hypertension: Secondary | ICD-10-CM | POA: Insufficient documentation

## 2020-07-04 DIAGNOSIS — J45909 Unspecified asthma, uncomplicated: Secondary | ICD-10-CM | POA: Diagnosis not present

## 2020-07-04 DIAGNOSIS — Y93E1 Activity, personal bathing and showering: Secondary | ICD-10-CM | POA: Diagnosis not present

## 2020-07-04 DIAGNOSIS — S79912A Unspecified injury of left hip, initial encounter: Secondary | ICD-10-CM | POA: Diagnosis not present

## 2020-07-04 DIAGNOSIS — S3992XA Unspecified injury of lower back, initial encounter: Secondary | ICD-10-CM | POA: Diagnosis present

## 2020-07-04 DIAGNOSIS — W19XXXA Unspecified fall, initial encounter: Secondary | ICD-10-CM

## 2020-07-04 MED ORDER — LIDOCAINE 5 % EX OINT: 1.0000 "application " | TOPICAL_OINTMENT | Freq: Three times a day (TID) | CUTANEOUS | 0 refills | Status: AC | PRN

## 2020-07-04 MED ORDER — OXYCODONE-ACETAMINOPHEN 5-325 MG PO TABS
1.0000 | ORAL_TABLET | Freq: Once | ORAL | Status: AC
  Administered 2020-07-04: 1 via ORAL
  Filled 2020-07-04: qty 1

## 2020-07-04 NOTE — ED Provider Notes (Signed)
Emergency Department Provider Note   I have reviewed the triage vital signs and the nursing notes.   HISTORY  Chief Complaint Fall   HPI Kyle Blair is a 59 y.o. male presents to the ED with buttock and lower back/left hip pain after fall. Patient was getting out of the shower when he slipped and fell. He tried to grab the curtain/shower rod but fell backwards hitting the edge of the tub with his buttock. Denies head injury or LOC. No upper back or neck pain. No CP or SOB. Pain is moderate and worse with movement. He has chronic knee and hip pain. No radiation of symptoms or other modifying factors.    Past Medical History:  Diagnosis Date  . Arthritis    severe; pt takes strong pain meds daily/legs  . Asthma    MONTH AGO  . BPH (benign prostatic hypertrophy)   . Diverticulosis   . GERD with stricture   . Hiatal hernia   . History of stroke 2004  . Hyperlipidemia   . Hypertension   . Hypothyroid   . Lung nodule 02/03/2012  . Osteoarthritis of left knee   . Polycystic kidney disease   . Stroke ([place])    2004   EQULIBRIUM, AND JUDGING DISTANCE  . Tubular adenoma of colon     Patient Active Problem List   Diagnosis Date Noted  . Primary osteoarthritis of left knee 11/19/2018  . OSA (obstructive sleep apnea) 06/02/2018  . 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 Ingrown toenail 05/08/2018  . Onychomycosis 05/08/2018  . Stroke (cerebrum) ([place]) 01/03/2018  . Acute metabolic encephalopathy 83/15/1761  . Tobacco abuse 01/03/2018  . Acute respiratory failure ([place]) 01/19/2017  . Community acquired pneumonia 01/18/2017  . Influenza with pneumonia 01/18/2017  . Indeterminate pulmonary nodules 07/23/2015  . Obese 05/20/2015  . Undiagnosed cardiac murmurs 04/15/2015  . Asthma with acute exacerbation 03/13/2014  . Hx of adenomatous colonic polyps 05/08/2013  . Barrett's esophagus 05/03/2013  . Chronic diarrhea 04/02/2013  . HTN (hypertension) 04/02/2013  . Hx of low  back pain 09/26/2012  . Arthralgia of right knee 05/02/2012  . Asthma 12/13/2011  . Benign prostatic hyperplasia 12/13/2011  . Hyperlipidemia 12/13/2011  . GERD (gastroesophageal reflux disease) 12/13/2011  . Hypothyroid   . Polycystic kidney disease     Past Surgical History:  Procedure Laterality Date  . CARPAL TUNNEL RELEASE    . COLONOSCOPY    . HERNIA REPAIR  2005   with mesh  . KNEE ARTHROSCOPY Bilateral 12/05/13   cyst and bone spur removed from left knee per pt.  Marland Kitchen KNEE ARTHROSCOPY Left 01/2015  . NASAL POLYP EXCISION  1992 or 93  . POLYPECTOMY    . TOTAL KNEE ARTHROPLASTY Right 05/19/2015   Procedure: RIGHT TOTAL KNEE ARTHROPLASTY;  Surgeon: Paralee Cancel, MD;  Location: [facility];  Service: Orthopedics;  Laterality: Right;  . TOTAL KNEE ARTHROPLASTY Left 11/19/2018   Procedure: LEFT  TOTAL KNEE ARTHROPLASTY;  Surgeon: Dorna Leitz, MD;  Location: [facility];  Service: Orthopedics;  Laterality: Left;    Allergies Aspirin, Doxycycline hyclate, Ibuprofen, Jynarque [tolvaptan], Peanut butter flavor, and Cephalexin  Family History  Problem Relation Age of Onset  . Heart disease Mother        pacemaker  . Diabetes Father   . Kidney disease Father   . Diabetes Sister   . Kidney disease Sister   . Colon cancer Neg Hx   . Esophageal cancer Neg Hx   .  Rectal cancer Neg Hx   . Stomach cancer Neg Hx   . Prostate cancer Neg Hx   . Pancreatic cancer Neg Hx     Social History Social History   Tobacco Use  . Smoking status: Current Every Day Smoker    Packs/day: 0.50    Years: 20.00    Pack years: 10.00    Types: Cigarettes  . Smokeless tobacco: Never Used  Vaping Use  . Vaping Use: Never used  Substance Use Topics  . Alcohol use: No    Alcohol/week: 0.0 standard drinks  . Drug use: No    Review of Systems  Constitutional: No fever/chills Eyes: No visual changes. ENT: No sore throat. Cardiovascular: Denies chest pain. Respiratory: Denies shortness of  breath. Gastrointestinal: No abdominal pain.  No nausea, no vomiting.  No diarrhea.  No constipation. Genitourinary: Negative for dysuria. Musculoskeletal: Positive lower back, buttock, and left hip pain.  Skin: Negative for rash. Neurological: Negative for headaches, focal weakness or numbness.  10-point ROS otherwise negative.  ____________________________________________   PHYSICAL EXAM:  VITAL SIGNS: ED Triage Vitals  Enc Vitals Group     BP 07/04/20 2158 (!) 166/102     Pulse Rate 07/04/20 2158 92     Resp 07/04/20 2158 18     Temp 07/04/20 2158 98.7 F (37.1 C)     Temp Source 07/04/20 2158 Oral     SpO2 07/04/20 2158 100 %     Weight 07/04/20 2156 236 lb 8.9 oz (107.3 kg)   Constitutional: Alert and oriented. Well appearing and in no acute distress. Eyes: Conjunctivae are normal. PERRL.  Head: Atraumatic. Nose: No congestion/rhinnorhea. Mouth/Throat: Mucous membranes are moist.  Neck: No stridor. No cervical spine tenderness to palpation. Cardiovascular: Normal rate, regular rhythm. Good peripheral circulation. Grossly normal heart sounds.   Respiratory: Normal respiratory effort.  No retractions. Lungs CTAB. Gastrointestinal: Soft and nontender. No distention.  Musculoskeletal: Pain with passive ROM of the left hip. Contusion to the superior aspect of the right buttock. No lacerations.  Neurologic:  Normal speech and language. Normal strength and sensation in the B/L LEs.  Skin:  Skin is warm, dry and intact. No rash noted. Contusion to the right buttock described above.   ____________________________________________  RADIOLOGY  DG Hip Unilat W or Wo Pelvis 2-3 Views Left  Result Date: 07/04/2020 CLINICAL DATA:  Pain status post fall EXAM: DG HIP (WITH OR WITHOUT PELVIS) 2-3V LEFT COMPARISON:  None. FINDINGS: There are advanced degenerative changes of the right hip. There are mild-to-moderate degenerative changes of the left hip. There is no acute displaced  fracture. There is no dislocation. IMPRESSION: 1. No acute displaced fracture or dislocation. 2. 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 Electronically Signed   By: Constance Holster M.D.   On: 07/04/2020 22:35    ____________________________________________   PROCEDURES  Procedure(s) performed:   Procedures  None ____________________________________________   INITIAL IMPRESSION / ASSESSMENT AND PLAN / ED COURSE  Pertinent labs & imaging results that were available during my care of the patient were reviewed by me and considered in my medical decision making (see chart for details).   Patient presents to the ED after mechanical fall. Imaging of the hip reviewed. Contusion noted on exam. Advised cool compress today and transition to heat afterwards. No midline spine tenderness. No head injury. Normal mental status. No additional emergency imaging at this time. Discussed ED return precautions.    ____________________________________________  FINAL CLINICAL IMPRESSION(S) / ED DIAGNOSES  Final diagnoses:  Fall, initial encounter  Hematoma  Left hip pain     MEDICATIONS GIVEN DURING THIS VISIT:  Medications  oxyCODONE-acetaminophen (PERCOCET/ROXICET) 5-325 MG per tablet 1 tablet (1 tablet Oral Given 07/04/20 2220)     NEW OUTPATIENT MEDICATIONS STARTED DURING THIS VISIT:  Discharge Medication List as of 07/04/2020 10:58 PM    START taking these medications   Details  lidocaine (XYLOCAINE) 5 % ointment Apply 1 application topically 3 (three) times daily as needed., Starting Sat 07/04/2020, Normal        Note:  This document was prepared using Dragon voice recognition software and may include unintentional dictation errors.  Nanda Quinton, MD, [facility] Emergency Medicine    Pamla Pangle, [facility], MD 07/11/20 2009

## 2020-07-04 NOTE — ED Triage Notes (Signed)
Patient states that he fell earlier today after working on his sons car. The patient fell when he was in the shower. Pt states that he hit the nozzle and his lower back and right hip pain

## 2020-07-04 NOTE — Discharge Instructions (Signed)
You were seen in the emergency room today after fall.  You have a bruise to your right buttock.  Your x-ray did not show any bony fracture.  You can apply ice to the area tonight followed by heat tomorrow.  Take Tylenol as needed for pain.  Have also called in a prescription for lidocaine/numbing cream which you can rub in the area of pain.  Please follow closely with your primary care doctor.

## 2020-07-14 ENCOUNTER — Telehealth: Payer: Medicare HMO | Admitting: Family

## 2020-07-14 ENCOUNTER — Encounter: Payer: Self-pay | Admitting: Family Medicine

## 2020-07-14 ENCOUNTER — Other Ambulatory Visit: Payer: Self-pay

## 2020-07-14 ENCOUNTER — Telehealth (INDEPENDENT_AMBULATORY_CARE_PROVIDER_SITE_OTHER): Payer: Medicare HMO | Admitting: Family Medicine

## 2020-07-14 DIAGNOSIS — J40 Bronchitis, not specified as acute or chronic: Secondary | ICD-10-CM

## 2020-07-14 MED ORDER — PREDNISONE 20 MG PO TABS: 40.0000 mg | ORAL_TABLET | Freq: Every day | ORAL | 0 refills | Status: AC

## 2020-07-14 MED ORDER — AZITHROMYCIN 250 MG PO TABS: ORAL_TABLET | ORAL | 0 refills | Status: DC

## 2020-07-14 MED ORDER — BENZONATATE 100 MG PO CAPS: 100.0000 mg | ORAL_CAPSULE | Freq: Three times a day (TID) | ORAL | 0 refills | Status: DC | PRN

## 2020-07-14 MED FILL — predniSONE 20 MG TABS: 20 | 5 days supply | Qty: 10 | Fill #0

## 2020-07-14 MED FILL — AZITHROMYCIN 250 MG TABLET: 250 | 5 days supply | Qty: 6 | Fill #0

## 2020-07-14 MED FILL — BENZONATATE 100 MG CAPS: 100 | 10 days supply | Qty: 30 | Fill #0

## 2020-07-14 NOTE — Progress Notes (Signed)
Chief Complaint  Patient presents with  . Nasal Congestion    Kyle Blair here for URI complaints. Due to COVID-19 pandemic, we are interacting via telephone. I verified patient's ID using 2 identifiers. Patient agreed to proceed with visit via this method. Patient is at home, I am at office. Patient and I are present for visit.   Duration: 20 days  Associated symptoms: sinus congestion, rhinorrhea, wheezing and shortness of breath Denies: sinus pain, itchy watery eyes, ear pain, ear drainage, sore throat, myalgia and fevers Treatment to date: Prednisone helped but things came back Sick contacts: No  Past Medical History:  Diagnosis Date  . Arthritis    severe; pt takes strong pain meds daily/legs  . Asthma    MONTH AGO  . BPH (benign prostatic hypertrophy)   . Diverticulosis   . GERD with stricture   . Hiatal hernia   . History of stroke 2004  . Hyperlipidemia   . Hypertension   . Hypothyroid   . Lung nodule 02/03/2012  . Osteoarthritis of left knee   . Polycystic kidney disease   . Stroke ([place])    2004   EQULIBRIUM, AND JUDGING DISTANCE  . Tubular adenoma of colon    Exam No conversational dyspnea Age appropriate judgment and insight Nml affect and mood  Wheezy bronchitis - Plan: azithromycin (ZITHROMAX) 250 MG tablet, benzonatate (TESSALON) 100 MG capsule, predniSONE (DELTASONE) 20 MG tablet  Continue to push fluids, practice good hand hygiene, cover mouth when coughing. F/u next week if no improvement. Total time: 7 min Pt voiced understanding and agreement to the plan.  [place], DO 07/14/20 8:01 AM

## 2020-07-22 ENCOUNTER — Telehealth: Payer: Self-pay | Admitting: Pharmacist

## 2020-07-22 NOTE — Progress Notes (Addendum)
Reviewed chart for medication changes ahead of medication coordination call.  07-01-2020 OV. Patient presented in the office with Dr. Nani Ravens for asthma exacerbation. Patient was given a Depo shot at the time of the visit. Was also prescribed 40 mg of prednisone to take daily for 5 days.   Was advised to continue inhalers, Albuterol inhaler every 2 hours as needed.  DuoNeb every 4 hours scheduled. Continue this until exacerbation resolved.   BP Readings from Last 3 Encounters:  07/04/20 140/87  07/01/20 (!) 144/98  01/22/20 127/80    Lab Results  Component Value Date   HGBA1C 5.2 01/03/2018     Patient obtains medications through [facility]  Adherence Packaging  90 Days   This is the patient's first adherence delivery.   Patient declined Atrovent Hfa Aer 56mcg; two puffs every six hours -PRN Cyclobenzaprine 5mg : one tab twice a day for pain -PRN Lidocaine 5% Oint- does not need   Patient is due for next adherence delivery on: 07-24-2020. Called patient and reviewed medications and coordinated delivery.  This delivery to include: Advair DIskus 560mcg-50; inhale one puff into the lungs twice a day Ventolin Inhaler; two puffs PRN Lovastatin 40mg ; one tab bedtime Amlodipine 10mg ; one tab breakfast Montelukast 10mg ; one tab bedtime Pantoprazole 40mg ; one tab morning Levothyroxine 223mcg; one tab morning Benazepril 20mg ; one tab breakfast Tamsulosin 0.4mg ; one cap breakfast Gabapentin 300mg ; one cap with breakfast, one with lunch, one with dinner Ropinirole 0.5mg ; one tab at bedtime Sildenafil 20mg ; one to two tabs PRN    Patient needs refills for Pantoprazole 40mg ; one tab morning.  Confirmed delivery date of 07-24-2020 advised patient that pharmacy will contact them the morning of delivery.

## 2020-07-23 ENCOUNTER — Other Ambulatory Visit: Payer: Self-pay | Admitting: Family

## 2020-07-23 DIAGNOSIS — K219 Gastro-esophageal reflux disease without esophagitis: Secondary | ICD-10-CM

## 2020-07-31 ENCOUNTER — Ambulatory Visit: Payer: Self-pay | Admitting: Pharmacist

## 2020-07-31 NOTE — Chronic Care Management (AMB) (Signed)
Patient called to inform me that his father is in hospice due to cancer of his intestines and colon and this has him really upset. He will be taking a trip to care for his mother and wants assistance with a medication that could help him "chill out".   He reports diarrhea x6 days and states he thinks this is due to him being emotionally upset.   Will help to get patient coordinated to see Melissa.

## 2020-08-04 ENCOUNTER — Telehealth: Payer: Self-pay | Admitting: Family

## 2020-08-04 MED FILL — FLOVENT HFA 110 MCG INHALER: 110 | 30 days supply | Qty: 12 | Fill #2

## 2020-08-04 NOTE — Telephone Encounter (Signed)
Patient would like the prescription to be sent today since the patient is leaving in the morning to [facility].

## 2020-08-04 NOTE — Telephone Encounter (Signed)
Caller: Coulter Call back # [phone]  Patient states he has been having diarrhea for 8 days. He would like to Imodium to be sent to [facility], [address]  [place], [place] [address]  Phone:  [phone] Fax:  [phone]

## 2020-08-05 MED ORDER — LOPERAMIDE HCL 2 MG PO TABS
2.0000 mg | ORAL_TABLET | Freq: Four times a day (QID) | ORAL | 0 refills | Status: DC | PRN
Start: 2020-08-05 — End: 2020-09-28

## 2020-08-05 MED FILL — ANTI-DIARRHEAL 2 MG TABS: 2 | 8 days supply | Qty: 24 | Fill #0

## 2020-08-05 NOTE — Progress Notes (Signed)
[place] the beach. Appt was schedule for 08/21/20

## 2020-08-14 ENCOUNTER — Ambulatory Visit (HOSPITAL_BASED_OUTPATIENT_CLINIC_OR_DEPARTMENT_OTHER)
Admission: RE | Admit: 2020-08-14 | Discharge: 2020-08-14 | Disposition: A | Discharge status: Home / Self Care | Payer: Medicare HMO | Source: Ambulatory Visit | Attending: Family | Admitting: Family

## 2020-08-14 ENCOUNTER — Encounter: Payer: Self-pay | Admitting: Family

## 2020-08-14 ENCOUNTER — Ambulatory Visit (INDEPENDENT_AMBULATORY_CARE_PROVIDER_SITE_OTHER): Payer: Medicare HMO | Admitting: Family

## 2020-08-14 ENCOUNTER — Other Ambulatory Visit: Payer: Self-pay

## 2020-08-14 VITALS — BP 138/72 | HR 84 | Temp 98.5°F | Resp 16 | Ht 67.0 in | Wt 227.0 lb

## 2020-08-14 DIAGNOSIS — R140 Abdominal distension (gaseous): Secondary | ICD-10-CM | POA: Diagnosis not present

## 2020-08-14 DIAGNOSIS — R197 Diarrhea, unspecified: Secondary | ICD-10-CM | POA: Diagnosis not present

## 2020-08-14 DIAGNOSIS — I7789 Other specified disorders of arteries and arterioles: Secondary | ICD-10-CM | POA: Diagnosis not present

## 2020-08-14 DIAGNOSIS — R918 Other nonspecific abnormal finding of lung field: Secondary | ICD-10-CM | POA: Diagnosis not present

## 2020-08-14 DIAGNOSIS — J9811 Atelectasis: Secondary | ICD-10-CM | POA: Diagnosis not present

## 2020-08-14 LAB — CBC WITH DIFFERENTIAL/PLATELET
Basophils Absolute: 0 10*3/uL (ref 0.0–0.1)
Basophils Relative: 0.1 % (ref 0.0–3.0)
Eosinophils Absolute: 0 10*3/uL (ref 0.0–0.7)
Eosinophils Relative: 0.6 % (ref 0.0–5.0)
HCT: 45.8 % (ref 39.0–52.0)
Hemoglobin: 15.9 g/dL (ref 13.0–17.0)
Lymphocytes Relative: 19.5 % (ref 12.0–46.0)
Lymphs Abs: 1.3 10*3/uL (ref 0.7–4.0)
MCHC: 34.7 g/dL (ref 30.0–36.0)
MCV: 92.3 fl (ref 78.0–100.0)
Monocytes Absolute: 0.6 10*3/uL (ref 0.1–1.0)
Monocytes Relative: 9.3 % (ref 3.0–12.0)
Neutro Abs: 4.5 10*3/uL (ref 1.4–7.7)
Neutrophils Relative %: 70.5 % (ref 43.0–77.0)
Platelets: 172 10*3/uL (ref 150.0–400.0)
RBC: 4.97 Mil/uL (ref 4.22–5.81)
RDW: 13.3 % (ref 11.5–15.5)
WBC: 6.4 10*3/uL (ref 4.0–10.5)

## 2020-08-14 LAB — COMPREHENSIVE METABOLIC PANEL
ALT: 14 U/L (ref 0–53)
AST: 14 U/L (ref 0–37)
Albumin: 4.4 g/dL (ref 3.5–5.2)
Alkaline Phosphatase: 60 U/L (ref 39–117)
BUN: 9 mg/dL (ref 6–23)
CO2: 27 mEq/L (ref 19–32)
Calcium: 9.3 mg/dL (ref 8.4–10.5)
Chloride: 105 mEq/L (ref 96–112)
Creatinine, Ser: 0.78 mg/dL (ref 0.40–1.50)
GFR: 102.01 mL/min (ref >60.00)
Glucose, Bld: 94 mg/dL (ref 70–99)
Potassium: 3.6 mEq/L (ref 3.5–5.1)
Sodium: 140 mEq/L (ref 135–145)
Total Bilirubin: 0.4 mg/dL (ref 0.2–1.2)
Total Protein: 6.5 g/dL (ref 6.0–8.3)

## 2020-08-14 NOTE — Patient Instructions (Signed)
Please complete lab work prior to leaving. Complete stool studies and return at your earliest convenience. Complete x-rays on the first floor.

## 2020-08-14 NOTE — Progress Notes (Signed)
Subjective:    Patient ID: Kyle Blair, male    DOB: April 04, 1961, 59 y.o.   MRN: [id number]  HPI  Patient is a 59 yr old male who presents today with chief complaint of diarrhea. Diarrhea started about 16 days. He reports that he having about 10 loose stools a day. States that he is "barely eating because I know what's going to happen." Reports that he has had some abdominal cramping. Denies BRBPR.  Some of the stools have been dark.  Usually "brown water."  Like "oatmeal" if he eats something. Denies associated fever.   He has been using imodium AD. This does help.  He was treated with azithromycin back in July for bronchitis.   He reports increased stress and sadness lately because his step father who raised him is dying of cancer. He lives in [place]. He hopes to be able to visit him but wants to get his diarrhea cleared up first.   Review of Systems See HPI  Past Medical History:  Diagnosis Date  . Arthritis    severe; pt takes strong pain meds daily/legs  . Asthma    MONTH AGO  . BPH (benign prostatic hypertrophy)   . Diverticulosis   . GERD with stricture   . Hiatal hernia   . History of stroke 2004  . Hyperlipidemia   . Hypertension   . Hypothyroid   . Lung nodule 02/03/2012  . Osteoarthritis of left knee   . Polycystic kidney disease   . Stroke ([place])    2004   EQULIBRIUM, AND JUDGING DISTANCE  . Tubular adenoma of colon      Social History   Socioeconomic History  . Marital status: Married    Spouse name: Not on file  . Number of children: 2  . Years of education: Not on file  . Highest education level: Not on file  Occupational History  . Occupation: DISABLED    Employer: UNEMPLOYED  Tobacco Use  . Smoking status: Current Every Day Smoker    Packs/day: 0.50    Years: 20.00    Pack years: 10.00    Types: Cigarettes  . Smokeless tobacco: Never Used  Vaping Use  . Vaping Use: Never used  Substance and Sexual Activity  . Alcohol use: No     Alcohol/week: 0.0 standard drinks  . Drug use: No  . Sexual activity: Not on file  Other Topics Concern  . Not on file  Social History Narrative   Caffeine use:  1 pot coffee daily, 1 glass tea   Regular exercise:  No. Has active lifestyle.   2 biological and 1 stepson.   Former smoker- quit in 2007   He is on disability- reports that he has a history of heat stroke.              Social Determinants of Health   Financial Resource Strain:   . Difficulty of Paying Living Expenses: Not on file  Food Insecurity:   . Worried About Charity fundraiser in the Last Year: Not on file  . Ran Out of Food in the Last Year: Not on file  Transportation Needs:   . Lack of Transportation (Medical): Not on file  . Lack of Transportation (Non-Medical): Not on file  Physical Activity:   . Days of Exercise per Week: Not on file  . Minutes of Exercise per Session: Not on file  Stress:   . Feeling of Stress : Not on file  Social  Connections:   . Frequency of Communication with Friends and Family: Not on file  . Frequency of Social Gatherings with Friends and Family: Not on file  . Attends Religious Services: Not on file  . Active Member of Clubs or Organizations: Not on file  . Attends Archivist Meetings: Not on file  . Marital Status: Not on file  Intimate Partner Violence:   . Fear of Current or Ex-Partner: Not on file  . Emotionally Abused: Not on file  . Physically Abused: Not on file  . Sexually Abused: Not on file    Past Surgical History:  Procedure Laterality Date  . CARPAL TUNNEL RELEASE    . COLONOSCOPY    . HERNIA REPAIR  2005   with mesh  . KNEE ARTHROSCOPY Bilateral 12/05/13   cyst and bone spur removed from left knee per pt.  [facility] Kitchen KNEE ARTHROSCOPY Left 01/2015  . NASAL POLYP EXCISION  1992 or 93  . POLYPECTOMY    . TOTAL KNEE ARTHROPLASTY Right 05/19/2015   Procedure: RIGHT TOTAL KNEE ARTHROPLASTY;  Surgeon: Paralee Cancel, MD;  Location: [facility];  Service:  Orthopedics;  Laterality: Right;  . TOTAL KNEE ARTHROPLASTY Left 11/19/2018   Procedure: LEFT  TOTAL KNEE ARTHROPLASTY;  Surgeon: Dorna Leitz, MD;  Location: [facility];  Service: Orthopedics;  Laterality: Left;    Family History  Problem Relation Age of Onset  . Heart disease Mother        pacemaker  . Diabetes Father   . Kidney disease Father   . Diabetes Sister   . Kidney disease Sister   . Colon cancer Neg Hx   . Esophageal cancer Neg Hx   . Rectal cancer Neg Hx   . Stomach cancer Neg Hx   . Prostate cancer Neg Hx   . Pancreatic cancer Neg Hx     Allergies  Allergen Reactions  . Aspirin Anaphylaxis  . Doxycycline Hyclate Swelling    Facial Swelling  . Ibuprofen Anaphylaxis  . Jynarque [Tolvaptan] Anaphylaxis  . Peanut Butter Flavor Anaphylaxis  . Cephalexin Rash    Current Outpatient Medications on File Prior to Visit  Medication Sig Dispense Refill  . acetaminophen (TYLENOL) 325 MG tablet Take 1-2 tablets (325-650 mg total) by mouth every 6 (six) hours as needed for mild pain or moderate pain. 30 tablet 0  . ADVAIR DISKUS 500-50 MCG/DOSE AEPB USE ONE INHALATION TWICE A DAY. 60 each 3  . albuterol (PROVENTIL) (2.5 MG/3ML) 0.083% nebulizer solution USE 1 VIAL VIA NEBULIZER EVERY 6 HOURS AS NEEDED FOR WHEEZING OR SHORTNESS OF BREATH 225 mL 1  . amLODipine (NORVASC) 10 MG tablet Take 1 tablet (10 mg total) by mouth daily. 30 tablet 5  . apixaban (ELIQUIS) 2.5 MG TABS tablet Take 1 tablet (2.5 mg total) by mouth 2 (two) times daily. 42 tablet 0  . azithromycin (ZITHROMAX) 250 MG tablet Take 2 tabs the first day and then 1 tab daily until you run out. 6 tablet 0  . benazepril (LOTENSIN) 20 MG tablet Take 1 tablet (20 mg total) by mouth daily. 90 tablet 1  . benzonatate (TESSALON) 100 MG capsule Take 1 capsule (100 mg total) by mouth 3 (three) times daily as needed. 30 capsule 0  . clotrimazole-betamethasone (LOTRISONE) cream Apply 1 application topically 2 (two) times daily. 30 g  0  . cyclobenzaprine (FLEXERIL) 5 MG tablet Take 1 tablet (5 mg total) by mouth 2 (two) times daily as needed for muscle spasms. 180 tablet  1  . docusate sodium (COLACE) 100 MG capsule Take 1 capsule (100 mg total) by mouth daily as needed for mild constipation. 60 capsule 3  . fluticasone (FLONASE) 50 MCG/ACT nasal spray Place 1 spray into both nostrils daily. 16 g 2  . fluticasone (FLOVENT HFA) 110 MCG/ACT inhaler INHALE 2 PUFFS INTO THE LUNGS DAILY WHILE IN YOUR YELLOW ZONE. RINSE MOUTH AND SPIT OUT AFTER USE. 12 g 5  . furosemide (LASIX) 20 MG tablet Take 1 tablet (20 mg total) by mouth daily as needed. 30 tablet 5  . gabapentin (NEURONTIN) 300 MG capsule TAKE 1 CAPSULE BY MOUTH THREE TIMES A DAY 90 capsule 5  . ipratropium (ATROVENT HFA) 17 MCG/ACT inhaler Inhale 2 puffs into the lungs every 6 (six) hours as needed for wheezing. 3 Inhaler 1  . levothyroxine (SYNTHROID) 200 MCG tablet TAKE ONE TABLET BY MOUTH EVERY MORNING BEFORE BREAKFAST 90 tablet 1  . lidocaine (XYLOCAINE) 5 % ointment Apply 1 application topically 3 (three) times daily as needed. 35.44 g 0  . loperamide (IMODIUM A-D) 2 MG tablet Take 1 tablet (2 mg total) by mouth 4 (four) times daily as needed for diarrhea or loose stools. 30 tablet 0  . lovastatin (MEVACOR) 40 MG tablet Take 1 tablet (40 mg total) by mouth at bedtime. 90 tablet 1  . montelukast (SINGULAIR) 10 MG tablet TAKE ONE TABLET EVERY MORNING 90 tablet 1  . pantoprazole (PROTONIX) 40 MG tablet Take 1 tablet (40 mg total) by mouth daily. 90 tablet 3  . rOPINIRole (REQUIP) 0.5 MG tablet Take 1 tablet (0.5 mg total) by mouth at bedtime. 30 tablet 5  . sildenafil (REVATIO) 20 MG tablet Take 1 to 2 tablets by mouth 30 minutes prior to sexual activity. 75 tablet 1  . tamsulosin (FLOMAX) 0.4 MG CAPS capsule TAKE ONE CAPSULE BY MOUTH DAILY 90 capsule 1  . varenicline (CHANTIX PAK) 0.5 MG X 11 & 1 MG X 42 tablet Take one 0.5 mg tablet by mouth once daily for 3 days, then  increase to one 0.5 mg tablet twice daily for 4 days, then increase to one 1 mg tablet twice daily. 53 tablet 1  . VENTOLIN HFA 108 (90 Base) MCG/ACT inhaler INHALE 2 PUFFS INTO THE LUNGS EVERY 4 HOURS AS NEEDED FOR WHEEZING OR SHORTNESS OF BREATH 64 g 1   No current facility-administered medications on file prior to visit.    BP 138/72 (BP Location: Right Arm, Patient Position: Sitting, Cuff Size: Large)   Pulse 84   Temp 98.5 F (36.9 C) (Oral)   Resp 16   Ht 5\' 7"  (1.702 m)   Wt 227 lb (103 kg)   SpO2 95%   BMI 35.55 kg/m       Objective:   Physical Exam Constitutional:      General: He is not in acute distress.    Appearance: He is well-developed.  HENT:     Head: Normocephalic and atraumatic.  Cardiovascular:     Rate and Rhythm: Normal rate and regular rhythm.     Heart sounds: No murmur heard.   Pulmonary:     Effort: Pulmonary effort is normal. No respiratory distress.     Breath sounds: Examination of the right-lower field reveals rales. Rales present. No wheezing.  Abdominal:     General: Bowel sounds are increased.     Palpations: Abdomen is soft.     Tenderness: There is no abdominal tenderness.  Skin:    General: Skin  is warm and dry.  Neurological:     Mental Status: He is alert and oriented to person, place, and time.  Psychiatric:        Behavior: Behavior normal.        Thought Content: Thought content normal.           Assessment & Plan:  Diarrhea- hx and recent abx are concerning for possibility of C. Diff infection. Will obtain stool panel for further evaluation.  Obtain CBC, CMET. I will also obtain a KUB due to abdominal distension.  Abnormal lung exam- RLL rales.  Obtain CXR for further evaluation.  This visit occurred during the SARS-CoV-2 public health emergency.  Safety protocols were in place, including screening questions prior to the visit, additional usage of staff PPE, and extensive cleaning of exam room while observing  appropriate contact time as indicated for disinfecting solutions.

## 2020-08-18 ENCOUNTER — Other Ambulatory Visit: Payer: Self-pay

## 2020-08-18 ENCOUNTER — Other Ambulatory Visit (INDEPENDENT_AMBULATORY_CARE_PROVIDER_SITE_OTHER): Payer: Medicare HMO

## 2020-08-18 ENCOUNTER — Telehealth: Payer: Medicare HMO

## 2020-08-18 DIAGNOSIS — R197 Diarrhea, unspecified: Secondary | ICD-10-CM | POA: Diagnosis not present

## 2020-08-18 NOTE — Addendum Note (Signed)
Addended by: Kelle Darting A on: 08/18/2020 12:26 PM   Modules accepted: Orders

## 2020-08-19 ENCOUNTER — Other Ambulatory Visit: Payer: Medicare HMO

## 2020-08-20 ENCOUNTER — Other Ambulatory Visit (INDEPENDENT_AMBULATORY_CARE_PROVIDER_SITE_OTHER): Payer: Medicare HMO

## 2020-08-20 ENCOUNTER — Other Ambulatory Visit: Payer: Self-pay

## 2020-08-20 ENCOUNTER — Telehealth: Payer: Self-pay | Admitting: Family

## 2020-08-20 DIAGNOSIS — R197 Diarrhea, unspecified: Secondary | ICD-10-CM | POA: Diagnosis not present

## 2020-08-20 LAB — GI PROFILE, STOOL, PCR

## 2020-08-20 LAB — FECAL OCCULT BLOOD, IMMUNOCHEMICAL: Fecal Occult Bld: NEGATIVE

## 2020-08-20 NOTE — Telephone Encounter (Signed)
Please advise pt that his stool study is normal.  No obvious source of stool infection.  How is his diarrhea?  If he is still having diarrhea- please place referral to [facility] for diarrhea.

## 2020-08-20 NOTE — Telephone Encounter (Signed)
Patient advised of results and he reports diarrhea is not better. Was referred to his GI doctor, Dr. Hilarie Fredrickson.  Appt for tomorrow with PCP cancelled, he will rs when better from his other issue.

## 2020-08-21 ENCOUNTER — Ambulatory Visit: Payer: Medicare HMO | Admitting: Family

## 2020-08-24 ENCOUNTER — Telehealth: Payer: Self-pay | Admitting: Internal Medicine

## 2020-08-24 NOTE — Telephone Encounter (Signed)
Left message for pt to call back  °

## 2020-08-26 NOTE — Telephone Encounter (Signed)
Pt did not return the call. 

## 2020-08-28 NOTE — Telephone Encounter (Signed)
Patient returned your call, please call patient one more time.   

## 2020-09-01 ENCOUNTER — Ambulatory Visit: Payer: Self-pay | Admitting: Pharmacist

## 2020-09-01 NOTE — Chronic Care Management (AMB) (Signed)
Called pt's home to speak to his wife. Pt answered phone and stated he needed a refill of Symbicort. He also stated he needed a refill of ropinirole. Reminded patient this medication was now placed in his packaging. He confirmed it was placed correctly in his adherence packaging. Will coordinate with pharmacy to get patient an acute fill of symbicort.

## 2020-09-01 NOTE — Telephone Encounter (Signed)
Left message on machine to call back  

## 2020-09-02 ENCOUNTER — Telehealth: Payer: Self-pay | Admitting: *Deleted

## 2020-09-02 NOTE — Telephone Encounter (Signed)
Pt has called several times and we have returned his calls and have not been able to reach him. Called the home number again and the cell number again and left messages for him to call back.

## 2020-09-02 NOTE — Telephone Encounter (Signed)
Pt did not return call. 

## 2020-09-02 NOTE — Telephone Encounter (Signed)
Pt is scheduled to see [facility] 09/28/20. He is very frustrated because he continues to have diarrhea. States his PCP has done tests and cannot find a cause for the diarrhea. He is taking Imodium and still having diarrhea. Pt states he has lost 14 pounds. Pt wants to know if Dr. Hilarie Fredrickson has any recommendations prior to the appt. Please advise.

## 2020-09-02 NOTE — Telephone Encounter (Signed)
Patient showed up on 2nd floor and was annoyed you were unavailable.  Sorry. Please call.

## 2020-09-03 ENCOUNTER — Other Ambulatory Visit: Payer: Self-pay

## 2020-09-03 DIAGNOSIS — I129 Hypertensive chronic kidney disease with stage 1 through stage 4 chronic kidney disease, or unspecified chronic kidney disease: Secondary | ICD-10-CM | POA: Diagnosis not present

## 2020-09-03 DIAGNOSIS — J40 Bronchitis, not specified as acute or chronic: Secondary | ICD-10-CM

## 2020-09-03 DIAGNOSIS — N181 Chronic kidney disease, stage 1: Secondary | ICD-10-CM | POA: Diagnosis not present

## 2020-09-03 DIAGNOSIS — Q446 Cystic disease of liver: Secondary | ICD-10-CM | POA: Diagnosis not present

## 2020-09-03 DIAGNOSIS — Q612 Polycystic kidney, adult type: Secondary | ICD-10-CM | POA: Diagnosis not present

## 2020-09-03 MED ORDER — DIPHENOXYLATE-ATROPINE 2.5-0.025 MG PO TABS
1.0000 | ORAL_TABLET | Freq: Four times a day (QID) | ORAL | 2 refills | Status: DC | PRN
Start: 2020-09-03 — End: 2020-09-28

## 2020-09-03 NOTE — Telephone Encounter (Signed)
Spoke with pt and he is aware. Script sent to pharmacy for lomotil. Pt knows he may need to have repeat labs if his symptoms continue.

## 2020-09-03 NOTE — Telephone Encounter (Signed)
GI pathogen panel was normal recently when checked by primary care He should keep the appointment but okay to offer Lomotil 1 tablet 4 times daily as needed to help control diarrhea until he can be seen He will likely need labs and potentially additional stool studies if this continues. Labs prior to the office visit could be CBC, CMP, TTG, IgA, TSH. Additional stool studies would include fecal leukocytes/lactoferrin and fecal elastase

## 2020-09-22 ENCOUNTER — Other Ambulatory Visit: Payer: Self-pay | Admitting: Family

## 2020-09-25 ENCOUNTER — Other Ambulatory Visit: Payer: Self-pay | Admitting: Family

## 2020-09-25 NOTE — Telephone Encounter (Signed)
Refill request received for furosemide 20mg . Has not been refilled since 04/17/2019 #30 and 5RF. Please advise if okay to refill.

## 2020-09-27 NOTE — Progress Notes (Signed)
09/27/2020 Kyle Blair [id number] [birth year]   Chief Complaint:  Diarrhea   History of Present Illness: Kyle Blair is a 59 year old male with a past medical history of arthritis, asthma, hypertension, CVA 2004, lung nodule, polycytic kidney disease, BPH, GERD, liver cysts, diverticulosis and colon polyps.  He was last seen in office by Dr. Hilarie Blair on 03/20/2018 for further evaluation regarding elevated ammonia levels with episodes of confusion. At that time, there was low suspicion for cirrhosis as he had normal albumin, platelet and LFT levels. An abdominal MRI 03/24/2020 (mild to moderate motion degradation throughout) showed multiple hepatic cysts and bile duct hamartomas consistent with polycystic liver disease, no suspicious lesions or evidence of cirrhosis. An abdominal sonogram was done 10/23/2019 which was suggestive of hepatic steatosis and liver cysts. Mother with history of cirrhosis.   He presents today for further evaluation regarding diarrhea he reports having diarrhea since early the onset of his diarrhea abrupt associated with abdominal bloat worse after eating.  He is accompanied by his wife.  No specific food or stress triggers He reported passing 10-11 nonbloody nonwatery diarrhea with increased urgency.  He was seen by his PCP and a GI pathogen panel 08/18/2020 was negative. WBC 6.4. CMP was normal. He took Imodium 6-8 tabs daily without signet he contacted Dr. Hilarie Blair and he was prescribed Lomotil 1 tab 4 times daily as needed and  to schedule follow-up appointment are all hydration.   Currently, he reports the Lomotil has significantly reduced his diarrhea.  He is now having 4-5 episodes of diarrhea daily. His abdominal cramping is less. He reports losing 12 lbs over the past 4 to 6 weeks. No fever, sweats or chills.  He took a Z-Pak for upper respiratory infection/congestion end of June or early July.  No other antibiotics since that.  His stress level is moderately  elevated since the end of July as his stepfather was diagnosed with a terminal cancer.  He drinks 1 glass of mild daily with dinner. No prior history of lactose intolerance. No other complaints at this time.  Eliquis is listed on his current medication list. He was previously on Eliquis post knee replacement surgery in 2019. He verified he took Eliquis for 2 weeks post total  left knee replacement surgery (DVT prophylaxis 10/2018) then stopped it. No history of a DVT.   CBC Latest Ref Rng & Units 08/14/2020 01/20/2020 01/20/2020  WBC 4.0 - 10.5 K/uL 6.4 - 6.2  Hemoglobin 13.0 - 17.0 g/dL 15.9 17.3(H) 17.4(H)  Hematocrit 39 - 52 % 45.8 51.0 51.4  Platelets 150 - 400 K/uL 172.0 - 163    CMP Latest Ref Rng & Units 08/14/2020 01/20/2020 01/20/2020  Glucose 70 - 99 mg/dL 94 - 101(H)  BUN 6 - 23 mg/dL 9 - 13  Creatinine 0.40 - 1.50 mg/dL 0.78 - 0.80  Sodium 135 - 145 mEq/L 140 139 137  Potassium 3.5 - 5.1 mEq/L 3.6 3.7 3.7  Chloride 96 - 112 mEq/L 105 - 103  CO2 19 - 32 mEq/L 27 - 25  Calcium 8.4 - 10.5 mg/dL 9.3 - 9.0  Total Protein 6.0 - 8.3 g/dL 6.5 - -  Total Bilirubin 0.2 - 1.2 mg/dL 0.4 - -  Alkaline Phos 39 - 117 U/L 60 - -  AST 0 - 37 U/L 14 - -  ALT 0 - 53 U/L 14 - -   Abdominal sonogram 10/23/2019: 1. Heterogeneous liver echotexture slightly increased suggesting steatosis. There are  cysts in the liver. 2. Kidneys show no hydronephrosis. Cysts and complex cysts within the bilateral kidneys, the dominant measured lesions on the right are stable to decreased in size compared to prior sonography. The dominant lesions on the left are stable to slightly increased in size but remain compatible with cysts. Lesions were previously evaluated with MRI.  Abdominal MRI 03/24/2018: 1. Mild to moderate motion degradation throughout. 2. 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 No suspicious liver lesion. 3. Multiple renal  lesions which are favored to represent cysts and complex cysts. No highly suspicious or dominant lesion identified.   EGD 10/26/2017: - There is no endoscopic evidence of Barrett's esophagus. - Small hiatal hernia. - No endoscopic esophageal abnormality seen today to explain patient's dysphagia. Esophagus dilated to 18 mm with balloon. - Normal stomach. - Normal examined duodenum. - No specimens collected.  Colonoscopy 10/26/2017: - One 4 mm tubular adenomatous polyp in the proximal transverse colon, removed with a cold snare. Resected and retrieved. - A tattoo was seen in the transverse colon. There was no evidence of residual polyp tissue (a 51mm flat polyp was removed and site tattooed 05/03/2013). - One 5 mm tubular adenomatous polyp at the recto-sigmoid colon, removed with a cold snare. Resected and retrieved. - Moderate diverticulosis in the sigmoid colon, in the descending colon, at the hepatic flexure and in the ascending colon. - The distal rectum and anal verge are normal on retroflexion view. - 5 year recall colonoscopy.  Current Outpatient Medications on File Prior to Visit  Medication Sig Dispense Refill   acetaminophen (TYLENOL) 325 MG tablet Take 1-2 tablets (325-650 mg total) by mouth every 6 (six) hours as needed for mild pain or moderate pain. 30 tablet 0   ADVAIR DISKUS 500-50 MCG/DOSE AEPB INHALE ONE PUFF into THE lungs TWICE DAILY AS DIRECTED 60 each 3   albuterol (PROVENTIL) (2.5 MG/3ML) 0.083% nebulizer solution USE 1 VIAL VIA NEBULIZER EVERY 6 HOURS AS NEEDED FOR WHEEZING OR SHORTNESS OF BREATH 225 mL 1   amLODipine (NORVASC) 10 MG tablet Take 1 tablet (10 mg total) by mouth daily. 30 tablet 5   apixaban (ELIQUIS) 2.5 MG TABS tablet Take 1 tablet (2.5 mg total) by mouth 2 (two) times daily. 42 tablet 0   benazepril (LOTENSIN) 20 MG tablet Take 1 tablet (20 mg total) by mouth daily. 90 tablet 1   benzonatate (TESSALON) 100 MG capsule Take 1 capsule (100 mg  total) by mouth 3 (three) times daily as needed. 30 capsule 0   clotrimazole-betamethasone (LOTRISONE) cream Apply 1 application topically 2 (two) times daily. 30 g 0   fluticasone (FLOVENT HFA) 110 MCG/ACT inhaler INHALE 2 PUFFS INTO THE LUNGS DAILY WHILE IN YOUR YELLOW ZONE. RINSE MOUTH AND SPIT OUT AFTER USE. 12 g 5   furosemide (LASIX) 20 MG tablet TAKE ONE (1) TABLET BY MOUTH EVERY DAY 30 tablet 5   gabapentin (NEURONTIN) 300 MG capsule TAKE 1 CAPSULE BY MOUTH THREE TIMES A DAY 90 capsule 5   ipratropium (ATROVENT HFA) 17 MCG/ACT inhaler Inhale 2 puffs into the lungs every 6 (six) hours as needed for wheezing. 3 Inhaler 1   levothyroxine (SYNTHROID) 200 MCG tablet TAKE ONE TABLET BY MOUTH EVERY MORNING BEFORE BREAKFAST 90 tablet 1   lidocaine (XYLOCAINE) 5 % ointment Apply 1 application topically 3 (three) times daily as needed. 35.44 g 0   lovastatin (MEVACOR) 40 MG tablet Take 1 tablet (40 mg  total) by mouth at bedtime. 90 tablet 1   montelukast (SINGULAIR) 10 MG tablet TAKE ONE TABLET EVERY MORNING 90 tablet 1   pantoprazole (PROTONIX) 40 MG tablet Take 1 tablet (40 mg total) by mouth daily. 90 tablet 3   rOPINIRole (REQUIP) 0.5 MG tablet Take 1 tablet (0.5 mg total) by mouth at bedtime. 30 tablet 5   sildenafil (REVATIO) 20 MG tablet Take 1 to 2 tablets by mouth 30 minutes prior to sexual activity. 75 tablet 1   tamsulosin (FLOMAX) 0.4 MG CAPS capsule TAKE ONE CAPSULE BY MOUTH DAILY 90 capsule 1   varenicline (CHANTIX PAK) 0.5 MG X 11 & 1 MG X 42 tablet Take one 0.5 mg tablet by mouth once daily for 3 days, then increase to one 0.5 mg tablet twice daily for 4 days, then increase to one 1 mg tablet twice daily. 53 tablet 1   VENTOLIN HFA 108 (90 Base) MCG/ACT inhaler INHALE 2 PUFFS INTO THE LUNGS EVERY 4 HOURS AS NEEDED FOR WHEEZING OR SHORTNESS OF BREATH 64 g 1   No current facility-administered medications on file prior to visit.   Allergies  Allergen Reactions    Aspirin Anaphylaxis   Doxycycline Hyclate Swelling    Facial Swelling   Ibuprofen Anaphylaxis   Jynarque [Tolvaptan] Anaphylaxis   Peanut Butter Flavor Anaphylaxis   Cephalexin Rash     Current Medications, Allergies, Past Medical History, Past Surgical History, Family History and Social History were reviewed in [employer] record.   Review of Systems:   Constitutional: + 12 lb weight loss.  Respiratory: Negative for shortness of breath.   Cardiovascular: Negative for chest pain, palpitations and leg swelling.  Gastrointestinal: See HPI.  Musculoskeletal: Negative for back pain or muscle aches.  Neurological: Negative for dizziness, headaches or paresthesias.    Physical Exam: BP 118/82 (BP Location: Left Arm, Patient Position: Sitting, Cuff Size: Large)    Pulse 81    Ht 5\' 6"  (1.676 m)    Wt 228 lb 4 oz (103.5 kg)    SpO2 96%    BMI 36.84 kg/m   General: Well developed 59 year old male in no acute distress. Head: Normocephalic and atraumatic. Eyes: No scleral icterus. Conjunctiva pink . Ears: Normal auditory acuity. Mouth: Dentition intact. No ulcers or lesions.  Lungs: Clear throughout to auscultation. Heart: Regular rate and rhythm, no murmur. Abdomen: Soft, nontender and nondistended. No masses or hepatomegaly. Normal bowel sounds x 4 quadrants.  Rectal: Deferred.  Musculoskeletal: Symmetrical with no gross deformities. Extremities: No edema. Neurological: Alert oriented x 4. No focal deficits.  Psychological: Alert and cooperative. Normal mood and affect  Assessment and Recommendations: 1. Diarrhea -Repeat C. Diff PCR. Pancreatic elastase level, fecal leukocyte and Lactoferrin level. TSH, CBC, CMP, CRP, TTG and  IGA. -Ok to continue Lomotil 1 qid PRN for now -Phillip's bacteria probiotic of choice once daily or least expensive probiotic x 2 weeks  -No dairy products  -Push fluids, bland diet  -Discussed scheduling a diagnostic  colonoscopy if the above evaluation negative and if diarrhea persists  -Follow up in office with  Dr. Hilarie Blair in 6 weeks -patient to call our office if symptoms worsen  2. GERD, stable  -Continue Pantoprazole 40mg  QD for now, consider changing to alternative PPI or Famotidine if diarrhea persists   3. History of colon polyps -Next colonoscopy due 10/2022  4. History of liver cysts   5. History of polycystic kidney disease

## 2020-09-28 ENCOUNTER — Encounter: Payer: Self-pay | Admitting: Nurse Practitioner

## 2020-09-28 ENCOUNTER — Other Ambulatory Visit (INDEPENDENT_AMBULATORY_CARE_PROVIDER_SITE_OTHER): Payer: Medicare HMO

## 2020-09-28 ENCOUNTER — Ambulatory Visit (INDEPENDENT_AMBULATORY_CARE_PROVIDER_SITE_OTHER): Payer: Medicare HMO | Admitting: Nurse Practitioner

## 2020-09-28 VITALS — BP 118/82 | HR 81 | Ht 66.0 in | Wt 228.2 lb

## 2020-09-28 DIAGNOSIS — R197 Diarrhea, unspecified: Secondary | ICD-10-CM | POA: Insufficient documentation

## 2020-09-28 DIAGNOSIS — A09 Infectious gastroenteritis and colitis, unspecified: Secondary | ICD-10-CM

## 2020-09-28 LAB — CBC WITH DIFFERENTIAL/PLATELET
Basophils Absolute: 0 10*3/uL (ref 0.0–0.1)
Basophils Relative: 0.3 % (ref 0.0–3.0)
Eosinophils Absolute: 0.1 10*3/uL (ref 0.0–0.7)
Eosinophils Relative: 1.2 % (ref 0.0–5.0)
HCT: 46.5 % (ref 39.0–52.0)
Hemoglobin: 16.2 g/dL (ref 13.0–17.0)
Lymphocytes Relative: 19.7 % (ref 12.0–46.0)
Lymphs Abs: 1.5 10*3/uL (ref 0.7–4.0)
MCHC: 34.8 g/dL (ref 30.0–36.0)
MCV: 91 fl (ref 78.0–100.0)
Monocytes Absolute: 0.8 10*3/uL (ref 0.1–1.0)
Monocytes Relative: 10.8 % (ref 3.0–12.0)
Neutro Abs: 5 10*3/uL (ref 1.4–7.7)
Neutrophils Relative %: 68 % (ref 43.0–77.0)
Platelets: 175 10*3/uL (ref 150.0–400.0)
RBC: 5.11 Mil/uL (ref 4.22–5.81)
RDW: 13 % (ref 11.5–15.5)
WBC: 7.4 10*3/uL (ref 4.0–10.5)

## 2020-09-28 LAB — COMPREHENSIVE METABOLIC PANEL
ALT: 16 U/L (ref 0–53)
AST: 15 U/L (ref 0–37)
Albumin: 4.6 g/dL (ref 3.5–5.2)
Alkaline Phosphatase: 76 U/L (ref 39–117)
BUN: 19 mg/dL (ref 6–23)
CO2: 30 mEq/L (ref 19–32)
Calcium: 9.4 mg/dL (ref 8.4–10.5)
Chloride: 102 mEq/L (ref 96–112)
Creatinine, Ser: 1.04 mg/dL (ref 0.40–1.50)
GFR: 73.16 mL/min (ref >60.00)
Glucose, Bld: 89 mg/dL (ref 70–99)
Potassium: 4.2 mEq/L (ref 3.5–5.1)
Sodium: 138 mEq/L (ref 135–145)
Total Bilirubin: 0.4 mg/dL (ref 0.2–1.2)
Total Protein: 7.2 g/dL (ref 6.0–8.3)

## 2020-09-28 LAB — TSH: TSH: 4.95 u[IU]/mL — ABNORMAL HIGH (ref 0.35–4.50)

## 2020-09-28 LAB — C-REACTIVE PROTEIN: CRP: 1.1 mg/dL (ref 0.5–20.0)

## 2020-09-28 MED ORDER — DIPHENOXYLATE-ATROPINE 2.5-0.025 MG PO TABS: 1.0000 | ORAL_TABLET | Freq: Four times a day (QID) | ORAL | 2 refills | Status: DC | PRN

## 2020-09-28 NOTE — Patient Instructions (Addendum)
If you are age 59 or older, your body mass index should be between 23-30. Your Body mass index is 36.84 kg/m. If this is out of the aforementioned range listed, please consider follow up with your Primary Care Provider.  If you are age 50 or younger, your body mass index should be between 19-25. Your Body mass index is 36.84 kg/m. If this is out of the aformentioned range listed, please consider follow up with your Primary Care Provider.   We have sent the following medications to your pharmacy for you to pick up at your convenience:  Lomotil  Over the counter Hardin Negus probiotic  Your provider has requested that you go to the basement level for lab work before leaving today. Press "B" on the elevator. The lab is located at the first door on the left as you exit the elevator.  No dairy products for 2 weeks  Follow up with Dr Hilarie Fredrickson in 6 weeks  Due to recent changes in healthcare laws, you may see the results of your imaging and laboratory studies on MyChart before your provider has had a chance to review them.  We understand that in some cases there may be results that are confusing or concerning to you. Not all laboratory results come back in the same time frame and the provider may be waiting for multiple results in order to interpret others.  Please give [place] 48 hours in order for your provider to thoroughly review all the results before contacting the office for clarification of your results.   Thank you for choosing [facility] Noralyn Pick, CRNP  [phone]

## 2020-09-29 LAB — IGA: Immunoglobulin A: 59 mg/dL (ref 47–310)

## 2020-09-29 LAB — TISSUE TRANSGLUTAMINASE, IGA: (tTG) Ab, IgA: <1 U/mL

## 2020-10-01 NOTE — Progress Notes (Signed)
Addendum: Reviewed and agree with assessment and management plan. Kasondra Junod M, MD  

## 2020-10-04 ENCOUNTER — Telehealth: Payer: Self-pay | Admitting: Family

## 2020-10-04 MED ORDER — LEVOTHYROXINE SODIUM 25 MCG PO TABS: 25.0000 ug | ORAL_TABLET | Freq: Every day | ORAL | 1 refills | Status: DC

## 2020-10-04 NOTE — Telephone Encounter (Signed)
Please contact pt and let him know that his lab work with GI shows that his synthroid needs to be increased.  I would like him to continue synthroid 266mcg once daily but also add synthroid 25 mcg once daily.  [facility]. Repeat tsh in 6 weeks.

## 2020-10-05 ENCOUNTER — Telehealth: Payer: Self-pay | Admitting: *Deleted

## 2020-10-05 NOTE — Telephone Encounter (Signed)
[facility] sent over a request that patient insurance does not cover Wixela inhaler.  Insurance will cover Breo ellipta, Advair, or Advair HFA.    We sent Advair, which is same medication, [facility].  Which pharmacy is patient using?  Left message on machine for patient to call and confirm.

## 2020-10-05 NOTE — Telephone Encounter (Signed)
Called patient a few times today but no answer, left detail message for him to call back about his results.

## 2020-10-06 NOTE — Progress Notes (Signed)
[facility] will transfer needed medications to patient's requested pharmacy until he returns from [place].

## 2020-10-06 NOTE — Telephone Encounter (Signed)
Patient is our of town in [place] and will be there for over a month. Will try to see if pharmacy can mail medication there to his mother's house.

## 2020-10-06 NOTE — Telephone Encounter (Signed)
Per [facility] pharmacist they will try to get medication to patient.

## 2020-10-07 ENCOUNTER — Telehealth: Payer: Self-pay | Admitting: Pharmacist

## 2020-10-07 NOTE — Progress Notes (Addendum)
Spoke with patient and informed him that medications was sent to [facility] on [address]. Called [facility] to confirm but line stayed busy for an hour.  Informed patient and he agreed that store is very busy also.  Patient is aware I will keep trying but recommend that he try also.  [place] Shannon, [facility] [place] Pharmacist Assistant [phone]  Reviewed by: De Blanch, PharmD Clinical Pharmacist [facility] [phone]

## 2020-10-11 ENCOUNTER — Other Ambulatory Visit: Payer: Self-pay | Admitting: Family

## 2020-10-12 DIAGNOSIS — R69 Illness, unspecified: Secondary | ICD-10-CM | POA: Diagnosis not present

## 2020-10-13 ENCOUNTER — Telehealth: Payer: Self-pay | Admitting: Pharmacist

## 2020-10-13 NOTE — Progress Notes (Addendum)
Chronic Care Management Pharmacy Assistant   Name: Kyle Blair  MRN: [id number] DOB: 06-13-1961  Reason for Encounter: Medication Review  PCP : [name], NP    Allergies:   Allergies  Allergen Reactions  . Aspirin Anaphylaxis  . Doxycycline Hyclate Swelling    Facial Swelling  . Ibuprofen Anaphylaxis  . Jynarque [Tolvaptan] Anaphylaxis  . Peanut Butter Flavor Anaphylaxis  . Cephalexin Rash    Medications: Outpatient Encounter Medications as of 10/13/2020  Medication Sig Note  . acetaminophen (TYLENOL) 325 MG tablet Take 1-2 tablets (325-650 mg total) by mouth every 6 (six) hours as needed for mild pain or moderate pain.   Marland Kitchen ADVAIR DISKUS 500-50 MCG/DOSE AEPB INHALE ONE PUFF into THE lungs TWICE DAILY AS DIRECTED   . albuterol (PROVENTIL) (2.5 MG/3ML) 0.083% nebulizer solution USE 1 VIAL VIA NEBULIZER EVERY 6 HOURS AS NEEDED FOR WHEEZING OR SHORTNESS OF BREATH   . amLODipine (NORVASC) 10 MG tablet Take 1 tablet (10 mg total) by mouth daily.   Marland Kitchen apixaban (ELIQUIS) 2.5 MG TABS tablet Take 1 tablet (2.5 mg total) by mouth 2 (two) times daily. 04/20/2020: Was given after knee surgery  . benazepril (LOTENSIN) 20 MG tablet TAKE ONE TABLET BY MOUTH EVERY MORNING   . benzonatate (TESSALON) 100 MG capsule Take 1 capsule (100 mg total) by mouth 3 (three) times daily as needed.   . clotrimazole-betamethasone (LOTRISONE) cream Apply 1 application topically 2 (two) times daily.   . diphenoxylate-atropine (LOMOTIL) 2.5-0.025 MG tablet Take 1 tablet by mouth 4 (four) times daily as needed for diarrhea or loose stools.   . fluticasone (FLOVENT HFA) 110 MCG/ACT inhaler INHALE 2 PUFFS INTO THE LUNGS DAILY WHILE IN YOUR YELLOW ZONE. RINSE MOUTH AND SPIT OUT AFTER USE.   . furosemide (LASIX) 20 MG tablet TAKE ONE (1) TABLET BY MOUTH EVERY DAY   . gabapentin (NEURONTIN) 300 MG capsule TAKE 1 CAPSULE BY MOUTH THREE TIMES A DAY   . ipratropium (ATROVENT HFA) 17 MCG/ACT inhaler  Inhale 2 puffs into the lungs every 6 (six) hours as needed for wheezing.   Marland Kitchen levothyroxine (SYNTHROID) 200 MCG tablet TAKE ONE TABLET BY MOUTH BEFORE BREAKFAST   . levothyroxine (SYNTHROID) 25 MCG tablet Take 1 tablet (25 mcg total) by mouth daily before breakfast.   . lidocaine (XYLOCAINE) 5 % ointment Apply 1 application topically 3 (three) times daily as needed.   . lovastatin (MEVACOR) 40 MG tablet TAKE ONE TABLET BY MOUTH EVERYDAY AT BEDTIME   . montelukast (SINGULAIR) 10 MG tablet TAKE ONE TABLET BY MOUTH EVERYDAY AT BEDTIME   . pantoprazole (PROTONIX) 40 MG tablet Take 1 tablet (40 mg total) by mouth daily.   Marland Kitchen rOPINIRole (REQUIP) 0.5 MG tablet TAKE ONE TABLET BY MOUTH EVERYDAY AT BEDTIME   . sildenafil (REVATIO) 20 MG tablet Take 1 to 2 tablets by mouth 30 minutes prior to sexual activity.   . tamsulosin (FLOMAX) 0.4 MG CAPS capsule TAKE ONE CAPSULE BY MOUTH DAILY   . varenicline (CHANTIX PAK) 0.5 MG X 11 & 1 MG X 42 tablet Take one 0.5 mg tablet by mouth once daily for 3 days, then increase to one 0.5 mg tablet twice daily for 4 days, then increase to one 1 mg tablet twice daily.   . VENTOLIN HFA 108 (90 Base) MCG/ACT inhaler INHALE 2 PUFFS INTO THE LUNGS EVERY 4 HOURS AS NEEDED FOR WHEEZING OR SHORTNESS OF BREATH    No facility-administered encounter medications on file  as of 10/13/2020.    Current Diagnosis: Patient Active Problem List   Diagnosis Date Noted  . Diarrhea 09/28/2020  . Primary osteoarthritis of left knee 11/19/2018  . OSA (obstructive sleep apnea) 06/02/2018  . 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 Ingrown toenail 05/08/2018  . Onychomycosis 05/08/2018  . 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 Acute metabolic encephalopathy 02/58/5277  . Tobacco abuse 01/03/2018  . Acute respiratory failure ([place]) 01/19/2017  . Community acquired pneumonia 01/18/2017  . Influenza with pneumonia 01/18/2017  . Indeterminate pulmonary nodules 07/23/2015  . Obese 05/20/2015  .  Undiagnosed cardiac murmurs 04/15/2015  . Asthma with acute exacerbation 03/13/2014  . Hx of adenomatous colonic polyps 05/08/2013  . Barrett's esophagus 05/03/2013  . Chronic diarrhea 04/02/2013  . HTN (hypertension) 04/02/2013  . Hx of low back pain 09/26/2012  . Arthralgia of right knee 05/02/2012  . Asthma 12/13/2011  . Benign prostatic hyperplasia 12/13/2011  . Hyperlipidemia 12/13/2011  . GERD (gastroesophageal reflux disease) 12/13/2011  . Hypothyroid   . Polycystic kidney disease     Goals Addressed   None    Reviewed chart for medication changes ahead of medication coordination call.  Consults: 09-28-20(Gastro) Patient presented in office with Georgina Peer for diarrhea.  Labs ordered.  Provider recommended probiotics, bland diet, and push fluids.  No Office Visits or Hospitalizations since last CPP visit.  No medication changes indicated OR if recent visit, treatment plan here.  BP Readings from Last 3 Encounters:  09/28/20 118/82  08/14/20 138/72  07/04/20 140/87    Lab Results  Component Value Date   HGBA1C 5.2 01/03/2018     Patient obtains medications through Adherence Packaging  90 Days   Last adherence delivery included:  Advair DIskus 557mcg-50; inhale one puff into the lungs twice a day Ventolin Inhaler; two puffs PRN Lovastatin 40mg ; one tab bedtime Amlodipine 10mg ; one tab breakfast Montelukast 10mg ; one tab bedtime Pantoprazole 40mg ; one tab morning Levothyroxine 238mcg; one tab morning Benazepril 20mg ; one tab breakfast Tamsulosin 0.4mg ; one cap breakfast Gabapentin 300mg ; one cap with breakfast, one with lunch, one with dinner Ropinirole 0.5mg ; one tab at bedtime Sildenafil 20mg ; one to two tabs PRN  Patient declined medication: Atrovent Hfa Aer 74mcg; two puffs every six hours -PRN Cyclobenzaprine 5mg : one tab twice a day for pain -PRN Lidocaine 5% Oint- does not need Advair DIskus 561mcg-50; inhale one puff into the lungs twice a  day Ventolin Inhaler; two puffs PRN Lovastatin 40mg ; one tab bedtime Amlodipine 10mg ; one tab breakfast Montelukast 10mg ; one tab bedtime Pantoprazole 40mg ; one tab morning Levothyroxine 266mcg; one tab morning Benazepril 20mg ; one tab breakfast Tamsulosin 0.4mg ; one cap breakfast Gabapentin 300mg ; one cap with breakfast, one with lunch, one with dinner Ropinirole 0.5mg ; one tab at bedtime Sildenafil 20mg ; one to two tabs PRN   Patient is due for next adherence delivery on: 10-19-20 but patient is in [place] with the death of his father. He reports he has enough medication to last until he returns home Called patient and reviewed medications and coordinated delivery.  Patient needs no refills for medications at this time.   Follow-Up:  Pharmacist Review   [place] Shannon, [place] [facility] [place] Pharmacist Assistant [phone]  Reviewed by: De Blanch, PharmD Clinical Pharmacist [facility] at [facility] [phone]

## 2020-10-14 ENCOUNTER — Telehealth: Payer: Self-pay | Admitting: Pharmacist

## 2020-10-14 NOTE — Progress Notes (Addendum)
Chronic Care Management Pharmacy Assistant   Name: Kyle Blair  MRN: [id number] DOB: 1961/06/14  Reason for Encounter: Medication Review   PCP : [name], NP  Allergies:   Allergies  Allergen Reactions  . Aspirin Anaphylaxis  . Doxycycline Hyclate Swelling    Facial Swelling  . Ibuprofen Anaphylaxis  . Jynarque [Tolvaptan] Anaphylaxis  . Peanut Butter Flavor Anaphylaxis  . Cephalexin Rash    Medications: Outpatient Encounter Medications as of 10/14/2020  Medication Sig Note  . acetaminophen (TYLENOL) 325 MG tablet Take 1-2 tablets (325-650 mg total) by mouth every 6 (six) hours as needed for mild pain or moderate pain.   Marland Kitchen ADVAIR DISKUS 500-50 MCG/DOSE AEPB INHALE ONE PUFF into THE lungs TWICE DAILY AS DIRECTED   . albuterol (PROVENTIL) (2.5 MG/3ML) 0.083% nebulizer solution USE 1 VIAL VIA NEBULIZER EVERY 6 HOURS AS NEEDED FOR WHEEZING OR SHORTNESS OF BREATH   . amLODipine (NORVASC) 10 MG tablet Take 1 tablet (10 mg total) by mouth daily.   Marland Kitchen apixaban (ELIQUIS) 2.5 MG TABS tablet Take 1 tablet (2.5 mg total) by mouth 2 (two) times daily. 04/20/2020: Was given after knee surgery  . benazepril (LOTENSIN) 20 MG tablet TAKE ONE TABLET BY MOUTH EVERY MORNING   . benzonatate (TESSALON) 100 MG capsule Take 1 capsule (100 mg total) by mouth 3 (three) times daily as needed.   . clotrimazole-betamethasone (LOTRISONE) cream Apply 1 application topically 2 (two) times daily.   . diphenoxylate-atropine (LOMOTIL) 2.5-0.025 MG tablet Take 1 tablet by mouth 4 (four) times daily as needed for diarrhea or loose stools.   . fluticasone (FLOVENT HFA) 110 MCG/ACT inhaler INHALE 2 PUFFS INTO THE LUNGS DAILY WHILE IN YOUR YELLOW ZONE. RINSE MOUTH AND SPIT OUT AFTER USE.   . furosemide (LASIX) 20 MG tablet TAKE ONE (1) TABLET BY MOUTH EVERY DAY   . gabapentin (NEURONTIN) 300 MG capsule TAKE 1 CAPSULE BY MOUTH THREE TIMES A DAY   . ipratropium (ATROVENT HFA) 17 MCG/ACT inhaler  Inhale 2 puffs into the lungs every 6 (six) hours as needed for wheezing.   Marland Kitchen levothyroxine (SYNTHROID) 200 MCG tablet TAKE ONE TABLET BY MOUTH BEFORE BREAKFAST   . levothyroxine (SYNTHROID) 25 MCG tablet Take 1 tablet (25 mcg total) by mouth daily before breakfast.   . lidocaine (XYLOCAINE) 5 % ointment Apply 1 application topically 3 (three) times daily as needed.   . lovastatin (MEVACOR) 40 MG tablet TAKE ONE TABLET BY MOUTH EVERYDAY AT BEDTIME   . montelukast (SINGULAIR) 10 MG tablet TAKE ONE TABLET BY MOUTH EVERYDAY AT BEDTIME   . pantoprazole (PROTONIX) 40 MG tablet Take 1 tablet (40 mg total) by mouth daily.   Marland Kitchen rOPINIRole (REQUIP) 0.5 MG tablet TAKE ONE TABLET BY MOUTH EVERYDAY AT BEDTIME   . sildenafil (REVATIO) 20 MG tablet Take 1 to 2 tablets by mouth 30 minutes prior to sexual activity.   . tamsulosin (FLOMAX) 0.4 MG CAPS capsule TAKE ONE CAPSULE BY MOUTH DAILY   . varenicline (CHANTIX PAK) 0.5 MG X 11 & 1 MG X 42 tablet Take one 0.5 mg tablet by mouth once daily for 3 days, then increase to one 0.5 mg tablet twice daily for 4 days, then increase to one 1 mg tablet twice daily.   . VENTOLIN HFA 108 (90 Base) MCG/ACT inhaler INHALE 2 PUFFS INTO THE LUNGS EVERY 4 HOURS AS NEEDED FOR WHEEZING OR SHORTNESS OF BREATH    No facility-administered encounter medications on file as  of 10/14/2020.    Current Diagnosis: Patient Active Problem List   Diagnosis Date Noted  . Diarrhea 09/28/2020  . Primary osteoarthritis of left knee 11/19/2018  . OSA (obstructive sleep apnea) 06/02/2018  . 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 Ingrown toenail 05/08/2018  . Onychomycosis 05/08/2018  . 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 Acute metabolic encephalopathy 33/43/5686  . Tobacco abuse 01/03/2018  . Acute respiratory failure ([place]) 01/19/2017  . Community acquired pneumonia 01/18/2017  . Influenza with pneumonia 01/18/2017  . Indeterminate pulmonary nodules 07/23/2015  . Obese 05/20/2015  .  Undiagnosed cardiac murmurs 04/15/2015  . Asthma with acute exacerbation 03/13/2014  . Hx of adenomatous colonic polyps 05/08/2013  . Barrett's esophagus 05/03/2013  . Chronic diarrhea 04/02/2013  . HTN (hypertension) 04/02/2013  . Hx of low back pain 09/26/2012  . Arthralgia of right knee 05/02/2012  . Asthma 12/13/2011  . Benign prostatic hyperplasia 12/13/2011  . Hyperlipidemia 12/13/2011  . GERD (gastroesophageal reflux disease) 12/13/2011  . Hypothyroid   . Polycystic kidney disease     Goals Addressed   None    10-05-20 [facility], [place], stated pharmacy received a new rx for levothyroxine 25 mcg, note says to take it with the 200 mcg to equal 225 mcg.  [place] asked "are we waiting until his next delivery to fill this?"  10-05-20 Fanny Skates, CPA states she will reach out to him for clarity.  10-05-20 [place] Shannon, CPA informed pharmacy and Ermalinda Barrios that patient was out of town..  10-05-20 Faith from [facility] stated patient did not get advair inhaler on last delivery and is now out of state for family emergency.  Patient requested pharmacy to transfer RX to [facility] on [address] in [place]. CPP asked Faith to get levothyroxine and advair transferred.  10-07-20 Faith from [facility] stated she transferred medication but after calling [facility] 3 times she was unable to get through to make sure they got them ready in order to inform the patient.  10-07-20 [place] Shannon, CPA informed patient scripts was sent to [place] in [place].   Follow-Up:  Pharmacist Review   Reviewed by: De Blanch, PharmD Clinical Pharmacist [facility] at [facility] [phone]    [place] Shannon, [place] [facility] [place] Pharmacist Assistant [phone]

## 2020-10-19 ENCOUNTER — Telehealth: Payer: Self-pay | Admitting: Pharmacist

## 2020-10-19 NOTE — Progress Notes (Signed)
Chronic Care Management Pharmacy Assistant   Name: Kyle Blair  MRN: [id number] DOB: 08-14-1961  Reason for Encounter: Medication Review  Patient Questions:  1.  Have you seen any other providers since your last visit? No  2.  Any changes in your medicines or health? No    PCP : [name], NP  Allergies:   Allergies  Allergen Reactions   Aspirin Anaphylaxis   Doxycycline Hyclate Swelling    Facial Swelling   Ibuprofen Anaphylaxis   Jynarque [Tolvaptan] Anaphylaxis   Peanut Butter Flavor Anaphylaxis   Cephalexin Rash    Medications: Outpatient Encounter Medications as of 10/19/2020  Medication Sig Note   acetaminophen (TYLENOL) 325 MG tablet Take 1-2 tablets (325-650 mg total) by mouth every 6 (six) hours as needed for mild pain or moderate pain.    ADVAIR DISKUS 500-50 MCG/DOSE AEPB INHALE ONE PUFF into THE lungs TWICE DAILY AS DIRECTED    albuterol (PROVENTIL) (2.5 MG/3ML) 0.083% nebulizer solution USE 1 VIAL VIA NEBULIZER EVERY 6 HOURS AS NEEDED FOR WHEEZING OR SHORTNESS OF BREATH    amLODipine (NORVASC) 10 MG tablet Take 1 tablet (10 mg total) by mouth daily.    apixaban (ELIQUIS) 2.5 MG TABS tablet Take 1 tablet (2.5 mg total) by mouth 2 (two) times daily. 04/20/2020: Was given after knee surgery   benazepril (LOTENSIN) 20 MG tablet TAKE ONE TABLET BY MOUTH EVERY MORNING    benzonatate (TESSALON) 100 MG capsule Take 1 capsule (100 mg total) by mouth 3 (three) times daily as needed.    clotrimazole-betamethasone (LOTRISONE) cream Apply 1 application topically 2 (two) times daily.    diphenoxylate-atropine (LOMOTIL) 2.5-0.025 MG tablet Take 1 tablet by mouth 4 (four) times daily as needed for diarrhea or loose stools.    fluticasone (FLOVENT HFA) 110 MCG/ACT inhaler INHALE 2 PUFFS INTO THE LUNGS DAILY WHILE IN YOUR YELLOW ZONE. RINSE MOUTH AND SPIT OUT AFTER USE.    furosemide (LASIX) 20 MG tablet TAKE ONE (1) TABLET BY MOUTH EVERY DAY     gabapentin (NEURONTIN) 300 MG capsule TAKE 1 CAPSULE BY MOUTH THREE TIMES A DAY    ipratropium (ATROVENT HFA) 17 MCG/ACT inhaler Inhale 2 puffs into the lungs every 6 (six) hours as needed for wheezing.    levothyroxine (SYNTHROID) 200 MCG tablet TAKE ONE TABLET BY MOUTH BEFORE BREAKFAST    levothyroxine (SYNTHROID) 25 MCG tablet Take 1 tablet (25 mcg total) by mouth daily before breakfast.    lidocaine (XYLOCAINE) 5 % ointment Apply 1 application topically 3 (three) times daily as needed.    lovastatin (MEVACOR) 40 MG tablet TAKE ONE TABLET BY MOUTH EVERYDAY AT BEDTIME    montelukast (SINGULAIR) 10 MG tablet TAKE ONE TABLET BY MOUTH EVERYDAY AT BEDTIME    pantoprazole (PROTONIX) 40 MG tablet Take 1 tablet (40 mg total) by mouth daily.    rOPINIRole (REQUIP) 0.5 MG tablet TAKE ONE TABLET BY MOUTH EVERYDAY AT BEDTIME    sildenafil (REVATIO) 20 MG tablet Take 1 to 2 tablets by mouth 30 minutes prior to sexual activity.    tamsulosin (FLOMAX) 0.4 MG CAPS capsule TAKE ONE CAPSULE BY MOUTH DAILY    varenicline (CHANTIX PAK) 0.5 MG X 11 & 1 MG X 42 tablet Take one 0.5 mg tablet by mouth once daily for 3 days, then increase to one 0.5 mg tablet twice daily for 4 days, then increase to one 1 mg tablet twice daily.    VENTOLIN HFA 108 (90 Base) MCG/ACT  inhaler INHALE 2 PUFFS INTO THE LUNGS EVERY 4 HOURS AS NEEDED FOR WHEEZING OR SHORTNESS OF BREATH    No facility-administered encounter medications on file as of 10/19/2020.    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   Ingrown toenail 05/08/2018   Onychomycosis 05/08/2018   Stroke (cerebrum) ([place]) 08/67/6195   Acute metabolic encephalopathy 09/32/6712   Tobacco abuse 01/03/2018   Acute respiratory failure ([place]) 01/19/2017   Community acquired  pneumonia 01/18/2017   Influenza with pneumonia 01/18/2017   Indeterminate pulmonary nodules 07/23/2015   Obese 05/20/2015   Undiagnosed cardiac murmurs 04/15/2015   Asthma with acute exacerbation 03/13/2014   Hx of adenomatous colonic polyps 05/08/2013   Barrett's esophagus 05/03/2013   Chronic diarrhea 04/02/2013   HTN (hypertension) 04/02/2013   Hx of low back pain 09/26/2012   Arthralgia of right knee 05/02/2012   Asthma 12/13/2011   Benign prostatic hyperplasia 12/13/2011   Hyperlipidemia 12/13/2011   GERD (gastroesophageal reflux disease) 12/13/2011   Hypothyroid    Polycystic kidney disease     Goals Addressed   None    Reviewed chart for medication changes ahead of medication coordination call.  No OVs, Consults, or hospital visits since last care coordination call/Pharmacist visit. (If appropriate, list visit date, provider name)  No medication changes indicated OR if recent visit, treatment plan here.  BP Readings from Last 3 Encounters:  09/28/20 118/82  08/14/20 138/72  07/04/20 140/87    Lab Results  Component Value Date   HGBA1C 5.2 01/03/2018     Called patient and discussed medications, no problems at this time.  Patient stated he is still in [place] and will be back in two weeks.  Follow-Up:  Comptroller and Pharmacist Review   [place] Shannon, [place] [place] Pharmacist Assistant [phone]

## 2020-11-03 ENCOUNTER — Ambulatory Visit: Payer: Self-pay | Admitting: Pharmacist

## 2020-11-03 ENCOUNTER — Other Ambulatory Visit: Payer: Self-pay

## 2020-11-03 DIAGNOSIS — R69 Illness, unspecified: Secondary | ICD-10-CM | POA: Diagnosis not present

## 2020-11-03 MED ORDER — VENTOLIN HFA 108 (90 BASE) MCG/ACT IN AERS: INHALATION_SPRAY | RESPIRATORY_TRACT | 1 refills | Status: DC

## 2020-11-03 MED ORDER — TAMSULOSIN HCL 0.4 MG PO CAPS: ORAL_CAPSULE | ORAL | 1 refills | Status: DC

## 2020-11-03 MED ORDER — AMLODIPINE BESYLATE 10 MG PO TABS: 10.0000 mg | ORAL_TABLET | Freq: Every day | ORAL | 1 refills | Status: DC

## 2020-11-03 MED ORDER — ADVAIR DISKUS 500-50 MCG/DOSE IN AEPB: INHALATION_SPRAY | RESPIRATORY_TRACT | 1 refills | Status: DC

## 2020-11-03 MED ORDER — CLOTRIMAZOLE-BETAMETHASONE 1-0.05 % EX CREA: 1.0000 "application " | TOPICAL_CREAM | Freq: Two times a day (BID) | CUTANEOUS | 0 refills | Status: DC

## 2020-11-03 NOTE — Chronic Care Management (AMB) (Signed)
Reviewed chart for medication changes ahead of medication coordination call.  No OVs, Consults, or hospital visits since last care coordination call/Pharmacist visit. (If appropriate, list visit date, provider name)  No medication changes indicated OR if recent visit, treatment plan here.  BP Readings from Last 3 Encounters:  09/28/20 118/82  08/14/20 138/72  07/04/20 140/87    Lab Results  Component Value Date   HGBA1C 5.2 01/03/2018     Patient obtains medications through Adherence Packaging  90 Days   Last adherence delivery included: Advair DIskus 548mcg-50; inhale one puff into the lungs twice a [place] Ventolin Inhaler; two puffs PRN Lovastatin 40mg ; one tab bedtime Amlodipine 10mg ; one tab breakfast Montelukast 10mg ; one tab bedtime Pantoprazole 40mg ; one tab morning Levothyroxine 274mcg; one tab morning Benazepril 20mg ; one tab breakfast Tamsulosin 0.4mg ; one cap breakfast Gabapentin 300mg ; one cap with breakfast, one with lunch, one with dinner Ropinirole 0.5mg ; one tab at bedtime Sildenafil 20mg ; one to two tabs PRN  Patient declined the following meds last month due to PRN use/additional supply on hand. Atrovent Hfa Aer 38mcg; two puffs every six hours -PRN Cyclobenzaprine 5mg : one tab twice a [place] for pain -PRN Lidocaine 5% Oint- does not need  Patient is due for next adherence delivery on: 11-04-20. Called patient and reviewed medications and coordinated delivery.  This delivery to include: Levothyroxine 240mcg daily- Before Breakfast   Amlodipine 10mg  daily- Breakfast  Benazepril Hcl 20mg  daily- Breakfast  Pantoprazole 40mg  daily- Breakfast  Montelukast Sodium 10mg  daily- Bedtime  Lovastatin 40mg  daily- Bedtime Tamsulosin 0.4mg  daily- Bedtime Ropinirole 0.5 mg daily- Bedtime  Gabapentin 300mg  three times daily- Vial  Cyclobenzapr 5mg  as needed- Vial  Sildenafil 20mg  as needed- Vial  Lomotil - Vial Advair 500/19mcg Ventolin  Hfa Aerosol  Albuterol  nebs Atrovent Hfa Aerosol  Betamethasone/Clotrimazole   Patient declined the following medications (meds) due to (reason) Lidocaine 5% Oint  Patient needs refills for Advair, amlodipine, Ventolin, tamsulosin, betamethasone/clotrimazole.  Confirmed delivery date of 11-04-20, advised patient that pharmacy will contact them the morning of delivery.  De Blanch, PharmD Clinical Pharmacist [place] [facility] at [facility] [phone]

## 2020-11-06 ENCOUNTER — Telehealth: Payer: Self-pay | Admitting: Family

## 2020-11-06 NOTE — Progress Notes (Signed)
Reviewed anticipated need of patient. Returned patient's call at both numbers and left voicemail.   De Blanch, PharmD Clinical Pharmacist [place] [facility] at [facility] [phone]

## 2020-11-06 NOTE — Telephone Encounter (Signed)
Patient would like to speak to you in regards to a medication. He would not give me any other info.

## 2020-11-26 ENCOUNTER — Telehealth: Payer: Self-pay | Admitting: Pharmacist

## 2020-11-26 NOTE — Progress Notes (Addendum)
Chronic Care Management Pharmacy Assistant   Name: Kyle Blair  MRN: [id number] DOB: 05-Feb-1961  Reason for Encounter: Medication Review    PCP : [name], NP  Allergies:   Allergies  Allergen Reactions   Aspirin Anaphylaxis   Doxycycline Hyclate Swelling    Facial Swelling   Ibuprofen Anaphylaxis   Jynarque [Tolvaptan] Anaphylaxis   Peanut Butter Flavor Anaphylaxis   Cephalexin Rash    Medications: Outpatient Encounter Medications as of 11/26/2020  Medication Sig Note   acetaminophen (TYLENOL) 325 MG tablet Take 1-2 tablets (325-650 mg total) by mouth every 6 (six) hours as needed for mild pain or moderate pain.    ADVAIR DISKUS 500-50 MCG/DOSE AEPB INHALE ONE PUFF into THE lungs TWICE DAILY AS DIRECTED    albuterol (PROVENTIL) (2.5 MG/3ML) 0.083% nebulizer solution USE 1 VIAL VIA NEBULIZER EVERY 6 HOURS AS NEEDED FOR WHEEZING OR SHORTNESS OF BREATH    amLODipine (NORVASC) 10 MG tablet Take 1 tablet (10 mg total) by mouth daily.    apixaban (ELIQUIS) 2.5 MG TABS tablet Take 1 tablet (2.5 mg total) by mouth 2 (two) times daily. 04/20/2020: Was given after knee surgery   benazepril (LOTENSIN) 20 MG tablet TAKE ONE TABLET BY MOUTH EVERY MORNING    benzonatate (TESSALON) 100 MG capsule Take 1 capsule (100 mg total) by mouth 3 (three) times daily as needed.    clotrimazole-betamethasone (LOTRISONE) cream Apply 1 application topically 2 (two) times daily.    diphenoxylate-atropine (LOMOTIL) 2.5-0.025 MG tablet Take 1 tablet by mouth 4 (four) times daily as needed for diarrhea or loose stools.    fluticasone (FLOVENT HFA) 110 MCG/ACT inhaler INHALE 2 PUFFS INTO THE LUNGS DAILY WHILE IN YOUR YELLOW ZONE. RINSE MOUTH AND SPIT OUT AFTER USE.    furosemide (LASIX) 20 MG tablet TAKE ONE (1) TABLET BY MOUTH EVERY DAY    gabapentin (NEURONTIN) 300 MG capsule TAKE 1 CAPSULE BY MOUTH THREE TIMES A DAY    ipratropium (ATROVENT HFA) 17 MCG/ACT inhaler Inhale 2 puffs into the  lungs every 6 (six) hours as needed for wheezing.    levothyroxine (SYNTHROID) 200 MCG tablet TAKE ONE TABLET BY MOUTH BEFORE BREAKFAST    levothyroxine (SYNTHROID) 25 MCG tablet Take 1 tablet (25 mcg total) by mouth daily before breakfast.    lidocaine (XYLOCAINE) 5 % ointment Apply 1 application topically 3 (three) times daily as needed.    lovastatin (MEVACOR) 40 MG tablet TAKE ONE TABLET BY MOUTH EVERYDAY AT BEDTIME    montelukast (SINGULAIR) 10 MG tablet TAKE ONE TABLET BY MOUTH EVERYDAY AT BEDTIME    pantoprazole (PROTONIX) 40 MG tablet Take 1 tablet (40 mg total) by mouth daily.    rOPINIRole (REQUIP) 0.5 MG tablet TAKE ONE TABLET BY MOUTH EVERYDAY AT BEDTIME    sildenafil (REVATIO) 20 MG tablet Take 1 to 2 tablets by mouth 30 minutes prior to sexual activity.    tamsulosin (FLOMAX) 0.4 MG CAPS capsule TAKE ONE CAPSULE BY MOUTH DAILY    varenicline (CHANTIX PAK) 0.5 MG X 11 & 1 MG X 42 tablet Take one 0.5 mg tablet by mouth once daily for 3 days, then increase to one 0.5 mg tablet twice daily for 4 days, then increase to one 1 mg tablet twice daily.    VENTOLIN HFA 108 (90 Base) MCG/ACT inhaler INHALE 2 PUFFS INTO THE LUNGS EVERY 4 HOURS AS NEEDED FOR WHEEZING OR SHORTNESS OF BREATH    No facility-administered encounter medications on file as  of 11/26/2020.    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   Ingrown toenail 05/08/2018   Onychomycosis 05/08/2018   Stroke (cerebrum) ([place]) 65/02/5464   Acute metabolic encephalopathy 68/11/7516   Tobacco abuse 01/03/2018   Acute respiratory failure ([place]) 01/19/2017   Community acquired pneumonia 01/18/2017   Influenza with pneumonia 01/18/2017   Indeterminate pulmonary nodules 07/23/2015   Obese 05/20/2015   Undiagnosed cardiac murmurs 04/15/2015   Asthma with  acute exacerbation 03/13/2014   Hx of adenomatous colonic polyps 05/08/2013   Barrett's esophagus 05/03/2013   Chronic diarrhea 04/02/2013   HTN (hypertension) 04/02/2013   Hx of low back pain 09/26/2012   Arthralgia of right knee 05/02/2012   Asthma 12/13/2011   Benign prostatic hyperplasia 12/13/2011   Hyperlipidemia 12/13/2011   GERD (gastroesophageal reflux disease) 12/13/2011   Hypothyroid    Polycystic kidney disease     Goals Addressed   None    Reviewed chart for medication changes ahead of medication coordination call.  No OVs, Consults, or hospital visits since last Pharmacist visit.  No medication changes indicated.  BP Readings from Last 3 Encounters:  09/28/20 118/82  08/14/20 138/72  07/04/20 140/87    Lab Results  Component Value Date   HGBA1C 5.2 01/03/2018     Patient obtains medications through Adherence Packaging  90 Days   Last adherence delivery included:  Levothyroxine 200 mcg daily- Before Breakfast   Amlodipine 10 mg daily- Breakfast  Benazepril Hcl 20 mg daily- Breakfast  Pantoprazole 40 mg daily- Breakfast  Montelukast Sodium 10mg  daily- Bedtime  Lovastatin 4 0mg  daily- Bedtime Tamsulosin 0.4mg  daily- Bedtime Ropinirole 0.5 mg daily- Bedtime  Gabapentin 300 mg three times daily- Vial  Cyclobenzaprine 5 mg as needed- Vial  Sildenafil 20 mg as needed- Vial  Lomotil - Vial Advair 500/45mcg Ventolin  Hfa Aerosol  Albuterol nebs Atrovent Hfa Aerosol  Betamethasone/Clotrimazole  Patient declined the following medication last month: Lidocaine 5% Oint  Patient is not due for next adherence delivery at this time. Called patient and reviewed medications and coordinated delivery.   Patient declined the following medications due to receiving a 90 day supply 11-03-2020 Levothyroxine 200 mcg daily- Before Breakfast   Amlodipine 10 mg daily- Breakfast  Benazepril Hcl 20 mg daily- Breakfast  Pantoprazole 40 mg daily- Breakfast  Montelukast  Sodium 10mg  daily- Bedtime  Lovastatin 4 0mg  daily- Bedtime Tamsulosin 0.4mg  daily- Bedtime Ropinirole 0.5 mg daily- Bedtime  Gabapentin 300 mg three times daily- Vial  Cyclobenzaprine 5 mg as needed- Vial  Sildenafil 20 mg as needed- Vial  Lomotil - Vial Advair 500/37mcg Ventolin  Hfa Aerosol  Albuterol nebs Atrovent Hfa Aerosol  Betamethasone/Clotrimazole   Patient needs refills for clotrimazole-betamethasone cream. A request was sent PCP.  Patient reported getting his Covid19  Booster around November 15th and flu shot October 20th. He stated he had a small blister where the shot took place.  Follow-Up:  Pharmacist Review   Fanny Skates, [place] Pharmacist Assistant [phone]  Reviewed by: De Blanch, PharmD, BCACP Clinical Pharmacist [facility] [phone]

## 2020-11-30 ENCOUNTER — Telehealth: Payer: Self-pay

## 2020-11-30 NOTE — Telephone Encounter (Signed)
Nurse Assessment Nurse: Raphael Gibney, RN, Vanita Ingles Date/Time (Eastern Time): 11/27/2020 9:56:37 AM Confirm and document reason for call. If symptomatic, describe symptoms. ---Caller states he has appt next Friday. his right pin?gli<???????? in his finger. Feels colder than the other fingers. Color is normal. maybe a little swelling. pain level 5 and 10 when he bumps it. Does the patient have any new or worsening symptoms? ---Yes Will a triage be completed? ---Yes Related visit to physician within the last 2 weeks? ---No Does the PT have any chronic conditions? (i.e. diabetes, asthma, this includes High risk factors for pregnancy, etc.) ---Yes List chronic conditions. ---polycystic kidney disease; asthma Is this a behavioral health or substance abuse call? ---No Guidelines Guideline Title Affirmed Question Affirmed Notes Nurse Date/Time (Eastern Time) Finger Pain [1] Numbness (i.e., loss of sensation) in hand or fingers AND [2] Rosamaria Lints, RN, Vanita Ingles 11/27/2020 9:59:44 AM Disp. Time Eilene Ghazi Time) Disposition Final User 11/27/2020 10:04:33 AM See PCP within 24 Hours Yes Raphael Gibney, RN, Vanita Ingles PLEASE NOTE: All timestamps contained within this report are represented as [place] Standard Time. CONFIDENTIALTY NOTICE: This fax transmission is intended only for the addressee. It contains information that is legally privileged, confidential or otherwise protected from use or disclosure. If you are not the intended recipient, you are strictly prohibited from reviewing, disclosing, copying using or disseminating any of this information or taking any action in reliance on or regarding this information. If you have received this fax in error, please notify us immediately by telephone so that we can arrange for its return to [place]. Phone: [phone], Toll-Free: [phone], Fax: [phone] Page: 2 of 2 Call Id: [id number] [place] Disagree/Comply Disagree Caller Understands Yes PreDisposition Call Doctor Care  Advice Given Per Guideline SEE PCP WITHIN 24 HOURS: * IF OFFICE WILL BE OPEN: You need to be examined within the next 24 hours. Call your doctor (or NP/PA) when the office opens and make an appointment. CALL BACK IF: * You become worse CARE ADVICE given per Finger Pain (Adult) guideline. * Fever occurs Comments User: Dannielle Burn, RN Date/Time Eilene Ghazi Time): 11/27/2020 10:04:16 AM pt states no appts are available for today. Does not want want to go to urgent care due to [place] REFUSED  Appt scheduled 12/04/2020

## 2020-12-01 ENCOUNTER — Other Ambulatory Visit: Payer: Self-pay | Admitting: Family

## 2020-12-02 ENCOUNTER — Ambulatory Visit: Payer: Medicare HMO | Admitting: Internal Medicine

## 2020-12-04 ENCOUNTER — Ambulatory Visit (INDEPENDENT_AMBULATORY_CARE_PROVIDER_SITE_OTHER): Payer: Medicare HMO | Admitting: Family

## 2020-12-04 ENCOUNTER — Other Ambulatory Visit: Payer: Self-pay

## 2020-12-04 VITALS — BP 141/77 | HR 87 | Temp 98.1°F | Resp 16 | Ht 66.0 in | Wt 228.0 lb

## 2020-12-04 DIAGNOSIS — L989 Disorder of the skin and subcutaneous tissue, unspecified: Secondary | ICD-10-CM | POA: Diagnosis not present

## 2020-12-04 DIAGNOSIS — E039 Hypothyroidism, unspecified: Secondary | ICD-10-CM

## 2020-12-04 LAB — TSH: TSH: 0.01 u[IU]/mL — ABNORMAL LOW (ref 0.35–4.50)

## 2020-12-04 NOTE — Patient Instructions (Signed)
Please complete lab work prior to leaving.   

## 2020-12-04 NOTE — Progress Notes (Signed)
Subjective:    Patient ID: Kyle Blair, male    DOB: 1961-07-21, 59 y.o.   MRN: [id number]  HPI  Patient is a 59 yr old male who presents today with c/o insect bite on right 5th finger and left shoulder pain.    Reports that he felt that he was bitten by something about 3 weeks ago. Had pain. Had a small amount of drainage.    Left shoulder lesion- reports lesion on left shoulder x 1 month. Notes that it is improving.   Hypothyroid-last visit TSH was noted to be elevated and we increased his Synthroid. Lab Results  Component Value Date   TSH 4.95 (H) 09/28/2020      Review of Systems  See HPI  Past Medical History:  Diagnosis Date  . Arthritis    severe; pt takes strong pain meds daily/legs  . Asthma    MONTH AGO  . BPH (benign prostatic hypertrophy)   . Diverticulosis   . GERD with stricture   . Hiatal hernia   . History of stroke 2004  . Hyperlipidemia   . Hypertension   . Hypothyroid   . Lung nodule 02/03/2012  . Osteoarthritis of left knee   . Polycystic kidney disease   . Stroke ([place])    2004   EQULIBRIUM, AND JUDGING DISTANCE  . Tubular adenoma of colon      Social History   Socioeconomic History  . Marital status: Married    Spouse name: Not on file  . Number of children: 2  . Years of education: Not on file  . Highest education level: Not on file  Occupational History  . Occupation: DISABLED    Employer: UNEMPLOYED  Tobacco Use  . Smoking status: Current Every Day Smoker    Packs/day: 0.50    Years: 20.00    Pack years: 10.00    Types: Cigarettes  . Smokeless tobacco: Never Used  Vaping Use  . Vaping Use: Never used  Substance and Sexual Activity  . Alcohol use: Yes  . Drug use: No  . Sexual activity: Not on file  Other Topics Concern  . Not on file  Social History Narrative   Caffeine use:  1 pot coffee daily, 1 glass tea   Regular exercise:  No. Has active lifestyle.   2 biological and 1 stepson.   Former smoker- quit in 2007    He is on disability- reports that he has a history of heat stroke.              Social Determinants of Health   Financial Resource Strain: Not on file  Food Insecurity: Not on file  Transportation Needs: Not on file  Physical Activity: Not on file  Stress: Not on file  Social Connections: Not on file  Intimate Partner Violence: Not on file    Past Surgical History:  Procedure Laterality Date  . CARPAL TUNNEL RELEASE    . COLONOSCOPY    . HERNIA REPAIR  2005   with mesh  . KNEE ARTHROSCOPY Bilateral 12/05/13   cyst and bone spur removed from left knee per pt.  Marland Kitchen KNEE ARTHROSCOPY Left 01/2015  . NASAL POLYP EXCISION  1992 or 93  . POLYPECTOMY    . TOTAL KNEE ARTHROPLASTY Right 05/19/2015   Procedure: RIGHT TOTAL KNEE ARTHROPLASTY;  Surgeon: Paralee Cancel, MD;  Location: [facility];  Service: Orthopedics;  Laterality: Right;  . TOTAL KNEE ARTHROPLASTY Left 11/19/2018   Procedure: LEFT  TOTAL KNEE  ARTHROPLASTY;  Surgeon: Dorna Leitz, MD;  Location: [facility];  Service: Orthopedics;  Laterality: Left;    Family History  Problem Relation Age of Onset  . Heart disease Mother        pacemaker  . Diabetes Father   . Kidney disease Father   . Diabetes Sister   . Kidney disease Sister   . Colon cancer Neg Hx   . Esophageal cancer Neg Hx   . Rectal cancer Neg Hx   . Stomach cancer Neg Hx   . Prostate cancer Neg Hx   . Pancreatic cancer Neg Hx     Allergies  Allergen Reactions  . Aspirin Anaphylaxis  . Doxycycline Hyclate Swelling    Facial Swelling  . Ibuprofen Anaphylaxis  . Jynarque [Tolvaptan] Anaphylaxis  . Peanut Butter Flavor Anaphylaxis  . Cephalexin Rash    Current Outpatient Medications on File Prior to Visit  Medication Sig Dispense Refill  . acetaminophen (TYLENOL) 325 MG tablet Take 1-2 tablets (325-650 mg total) by mouth every 6 (six) hours as needed for mild pain or moderate pain. 30 tablet 0  . ADVAIR DISKUS 500-50 MCG/DOSE AEPB INHALE ONE PUFF into THE  lungs TWICE DAILY AS DIRECTED 180 each 1  . albuterol (PROVENTIL) (2.5 MG/3ML) 0.083% nebulizer solution USE 1 VIAL VIA NEBULIZER EVERY 6 HOURS AS NEEDED FOR WHEEZING OR SHORTNESS OF BREATH 225 mL 1  . amLODipine (NORVASC) 10 MG tablet Take 1 tablet (10 mg total) by mouth daily. 90 tablet 1  . apixaban (ELIQUIS) 2.5 MG TABS tablet Take 1 tablet (2.5 mg total) by mouth 2 (two) times daily. 42 tablet 0  . benazepril (LOTENSIN) 20 MG tablet TAKE ONE TABLET BY MOUTH EVERY MORNING 90 tablet 1  . benzonatate (TESSALON) 100 MG capsule Take 1 capsule (100 mg total) by mouth 3 (three) times daily as needed. 30 capsule 0  . clotrimazole-betamethasone (LOTRISONE) cream Apply 1 application topically 2 (two) times daily. 30 g 0  . diphenoxylate-atropine (LOMOTIL) 2.5-0.025 MG tablet Take 1 tablet by mouth 4 (four) times daily as needed for diarrhea or loose stools. 60 tablet 2  . fluticasone (FLOVENT HFA) 110 MCG/ACT inhaler INHALE 2 PUFFS INTO THE LUNGS DAILY WHILE IN YOUR YELLOW ZONE. RINSE MOUTH AND SPIT OUT AFTER USE. 12 g 5  . furosemide (LASIX) 20 MG tablet TAKE ONE (1) TABLET BY MOUTH EVERY DAY 30 tablet 5  . gabapentin (NEURONTIN) 300 MG capsule TAKE 1 CAPSULE BY MOUTH THREE TIMES A DAY 90 capsule 5  . levothyroxine (SYNTHROID) 200 MCG tablet TAKE ONE TABLET BY MOUTH BEFORE BREAKFAST 90 tablet 1  . levothyroxine (SYNTHROID) 25 MCG tablet Take 1 tablet (25 mcg total) by mouth daily before breakfast. 90 tablet 1  . lidocaine (XYLOCAINE) 5 % ointment Apply 1 application topically 3 (three) times daily as needed. 35.44 g 0  . lovastatin (MEVACOR) 40 MG tablet TAKE ONE TABLET BY MOUTH EVERYDAY AT BEDTIME 90 tablet 1  . montelukast (SINGULAIR) 10 MG tablet TAKE ONE TABLET BY MOUTH EVERYDAY AT BEDTIME 90 tablet 1  . pantoprazole (PROTONIX) 40 MG tablet Take 1 tablet (40 mg total) by mouth daily. 90 tablet 3  . rOPINIRole (REQUIP) 0.5 MG tablet TAKE ONE TABLET BY MOUTH EVERYDAY AT BEDTIME 90 tablet 1  .  sildenafil (REVATIO) 20 MG tablet TAKE 1 TO 2 TABLETS BY MOUTH 30 MINUTES prior TO sexual activity 75 tablet 1  . tamsulosin (FLOMAX) 0.4 MG CAPS capsule TAKE ONE CAPSULE BY MOUTH DAILY  90 capsule 1  . varenicline (CHANTIX PAK) 0.5 MG X 11 & 1 MG X 42 tablet Take one 0.5 mg tablet by mouth once daily for 3 days, then increase to one 0.5 mg tablet twice daily for 4 days, then increase to one 1 mg tablet twice daily. 53 tablet 1  . VENTOLIN HFA 108 (90 Base) MCG/ACT inhaler INHALE 2 PUFFS INTO THE LUNGS EVERY 4 HOURS AS NEEDED FOR WHEEZING OR SHORTNESS OF BREATH 64 g 1  . ipratropium (ATROVENT HFA) 17 MCG/ACT inhaler Inhale 2 puffs into the lungs every 6 (six) hours as needed for wheezing. 3 Inhaler 1   No current facility-administered medications on file prior to visit.    BP (!) 141/77 (BP Location: Right Arm, Patient Position: Sitting, Cuff Size: Small)   Pulse 87   Temp 98.1 F (36.7 C) (Oral)   Resp 16   Ht 5\' 6"  (1.676 m)   Wt 228 lb (103.4 kg)   SpO2 96%   BMI 36.80 kg/m        Objective:   Physical Exam Constitutional:      General: He is not in acute distress.    Appearance: He is well-developed and well-nourished.  HENT:     Head: Normocephalic and atraumatic.  Cardiovascular:     Rate and Rhythm: Normal rate and regular rhythm.     Heart sounds: No murmur heard.   Pulmonary:     Effort: Pulmonary effort is normal. No respiratory distress.     Breath sounds: Normal breath sounds. No wheezing or rales.  Musculoskeletal:        General: No edema.  Skin:    General: Skin is warm and dry.     Comments: Small scabbed lesion noted on right fifth finger dorsally no significant erythema.  Small scabbed lesion noted on left shoulder.  No erythema or drainage.  Neurological:     Mental Status: He is alert and oriented to person, place, and time.  Psychiatric:        Mood and Affect: Mood and affect normal.        Behavior: Behavior normal.        Thought Content:  Thought content normal.           Assessment & Plan:  Skin lesions-neither appear to be infected at this time.  It is possible they could have been insect bites.  He is advised to call if symptoms worsen or if they do not continue to improve.  Hypothyroid-clinically stable.  Continue Synthroid 225 mcg p.o. once daily.  Obtain follow-up TSH  This visit occurred during the SARS-CoV-2 public health emergency.  Safety protocols were in place, including screening questions prior to the visit, additional usage of staff PPE, and extensive cleaning of exam room while observing appropriate contact time as indicated for disinfecting solutions.

## 2020-12-05 ENCOUNTER — Telehealth: Payer: Self-pay | Admitting: Family

## 2020-12-05 NOTE — Telephone Encounter (Signed)
Cholesterol medication now looks too strong. He should continue levothyroxin 278mcg but stop the 5mcg tab. Repeat tsh in 6 weeks, dx hypothyroid.

## 2020-12-07 ENCOUNTER — Other Ambulatory Visit: Payer: Self-pay

## 2020-12-07 DIAGNOSIS — E039 Hypothyroidism, unspecified: Secondary | ICD-10-CM

## 2020-12-07 NOTE — Telephone Encounter (Signed)
Lvm for patient to call back

## 2020-12-07 NOTE — Telephone Encounter (Signed)
Results given to patient's wife and sent to his My chart with instructions to call back and schedule tsh.

## 2020-12-08 ENCOUNTER — Telehealth: Payer: Self-pay | Admitting: Family

## 2020-12-08 NOTE — Telephone Encounter (Signed)
-----   Message from Jiles Prows, [place] sent at 11/17/2020  4:50 PM EST ----- Regarding: FW: new patient appointment Please call this patient to give his mother a new patient appointment with Melissa. Thanks.  ----- Message ----- From: [name], NP Sent: 11/13/2020   5:06 PM EST To: Jiles Prows, CMA Subject: RE: new patient appointment                    OK.  ----- Message ----- From: Jiles Prows, CMA Sent: 11/13/2020   4:38 PM EST To: [name], NP Subject: new patient appointment                        Patient will like to know if Lenna Sciara will take his mother Sigurd Sos dob 03-08-1938 as a new patient. Mother moving in with patient after her husband passed.

## 2020-12-08 NOTE — Telephone Encounter (Signed)
Appt was scheduled with pt

## 2020-12-29 ENCOUNTER — Telehealth: Payer: Self-pay | Admitting: Pharmacist

## 2020-12-29 NOTE — Progress Notes (Addendum)
Chronic Care Management Pharmacy Assistant   Name: Kyle Blair  MRN: [id number] DOB: 1961/02/25  Reason for Encounter: Medication Review   PCP : [name], NP  Allergies:   Allergies  Allergen Reactions   Aspirin Anaphylaxis   Doxycycline Hyclate Swelling    Facial Swelling   Ibuprofen Anaphylaxis   Jynarque [Tolvaptan] Anaphylaxis   Peanut Butter Flavor Anaphylaxis   Cephalexin Rash    Medications: Outpatient Encounter Medications as of 12/29/2020  Medication Sig   acetaminophen (TYLENOL) 325 MG tablet Take 1-2 tablets (325-650 mg total) by mouth every 6 (six) hours as needed for mild pain or moderate pain.   ADVAIR DISKUS 500-50 MCG/DOSE AEPB INHALE ONE PUFF into THE lungs TWICE DAILY AS DIRECTED   albuterol (PROVENTIL) (2.5 MG/3ML) 0.083% nebulizer solution USE 1 VIAL VIA NEBULIZER EVERY 6 HOURS AS NEEDED FOR WHEEZING OR SHORTNESS OF BREATH   amLODipine (NORVASC) 10 MG tablet Take 1 tablet (10 mg total) by mouth daily.   benazepril (LOTENSIN) 20 MG tablet TAKE ONE TABLET BY MOUTH EVERY MORNING   clotrimazole-betamethasone (LOTRISONE) cream Apply 1 application topically 2 (two) times daily.   diphenoxylate-atropine (LOMOTIL) 2.5-0.025 MG tablet Take 1 tablet by mouth 4 (four) times daily as needed for diarrhea or loose stools.   fluticasone (FLOVENT HFA) 110 MCG/ACT inhaler INHALE 2 PUFFS INTO THE LUNGS DAILY WHILE IN YOUR YELLOW ZONE. RINSE MOUTH AND SPIT OUT AFTER USE.   furosemide (LASIX) 20 MG tablet TAKE ONE (1) TABLET BY MOUTH EVERY DAY   gabapentin (NEURONTIN) 300 MG capsule TAKE 1 CAPSULE BY MOUTH THREE TIMES A DAY   ipratropium (ATROVENT HFA) 17 MCG/ACT inhaler Inhale 2 puffs into the lungs every 6 (six) hours as needed for wheezing.   levothyroxine (SYNTHROID) 200 MCG tablet TAKE ONE TABLET BY MOUTH BEFORE BREAKFAST   lidocaine (XYLOCAINE) 5 % ointment Apply 1 application topically 3 (three) times daily as needed.   lovastatin (MEVACOR) 40 MG tablet  TAKE ONE TABLET BY MOUTH EVERYDAY AT BEDTIME   montelukast (SINGULAIR) 10 MG tablet TAKE ONE TABLET BY MOUTH EVERYDAY AT BEDTIME   pantoprazole (PROTONIX) 40 MG tablet Take 1 tablet (40 mg total) by mouth daily.   rOPINIRole (REQUIP) 0.5 MG tablet TAKE ONE TABLET BY MOUTH EVERYDAY AT BEDTIME   sildenafil (REVATIO) 20 MG tablet TAKE 1 TO 2 TABLETS BY MOUTH 30 MINUTES prior TO sexual activity   tamsulosin (FLOMAX) 0.4 MG CAPS capsule TAKE ONE CAPSULE BY MOUTH DAILY   VENTOLIN HFA 108 (90 Base) MCG/ACT inhaler INHALE 2 PUFFS INTO THE LUNGS EVERY 4 HOURS AS NEEDED FOR WHEEZING OR SHORTNESS OF BREATH   No facility-administered encounter medications on file as of 12/29/2020.    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   Ingrown toenail 05/08/2018   Onychomycosis 05/08/2018   Stroke (cerebrum) (HCC) 01/03/2018   Acute metabolic encephalopathy 01/03/2018   Tobacco abuse 01/03/2018   Acute respiratory failure (HCC) 01/19/2017   Community acquired pneumonia 01/18/2017   Influenza with pneumonia 01/18/2017   Indeterminate pulmonary nodules 07/23/2015   Obese 05/20/2015   Undiagnosed cardiac murmurs 04/15/2015   Asthma with acute exacerbation 03/13/2014   Hx of adenomatous colonic polyps 05/08/2013   Barrett's esophagus 05/03/2013   Chronic diarrhea 04/02/2013   HTN (hypertension) 04/02/2013   Hx of low back pain  09/26/2012   Arthralgia of right knee 05/02/2012   Asthma 12/13/2011   Benign prostatic hyperplasia 12/13/2011   Hyperlipidemia 12/13/2011   GERD (gastroesophageal reflux disease) 12/13/2011   Hypothyroid    Polycystic kidney disease     Goals Addressed   None    Reviewed chart for medication changes ahead of medication coordination call.  Office Visits: 12-04-2020 (PCP) Patient presented in  the office c/o an insect bite on his right 5th digit finger. Also has shoulder pain with a lesion for one month. Per the notes, skin lesions appeared to be insect bites. Lab work was ordered. No medication changes.  No Consults or hospital visits since last care coordination call.   No medication changes indicated.  BP Readings from Last 3 Encounters:  12/04/20 (!) 141/77  09/28/20 118/82  08/14/20 138/72    Lab Results  Component Value Date   HGBA1C 5.2 01/03/2018     Patient obtains medications through Adherence Packaging  90 Days   Last adherence delivery included:  Levothyroxine 200 mcg daily- Before Breakfast   Amlodipine 10 mg daily- Breakfast  Benazepril Hcl 20 mg daily- Breakfast  Pantoprazole 40 mg daily- Breakfast  Montelukast Sodium 10mg  daily- Bedtime  Lovastatin 4 0mg  daily- Bedtime Tamsulosin 0.4mg  daily- Bedtime Ropinirole 0.5 mg daily- Bedtime  Gabapentin 300 mg three times daily- Vial  Cyclobenzaprine 5 mg as needed- Vial  Sildenafil 20 mg as needed- Vial  Lomotil - Vial Advair 500/71mcg Ventolin  Hfa Aerosol  Albuterol nebs Atrovent Hfa Aerosol  Betamethasone/Clotrimazole   Patient declined the following medication last month: Lidocaine 5% Oint   Patient is not due for next adherence delivery at this time. Called patient and reviewed medications and coordinated delivery.   Patient declined the following medications due to receiving a 90 day supply 11-03-2020 Levothyroxine 200 mcg daily- Before Breakfast   Amlodipine 10 mg daily- Breakfast  Benazepril Hcl 20 mg daily- Breakfast  Pantoprazole 40 mg daily- Breakfast  Montelukast Sodium 10mg  daily- Bedtime  Lovastatin 4 0mg  daily- Bedtime Tamsulosin 0.4mg  daily- Bedtime Ropinirole 0.5 mg daily- Bedtime  Gabapentin 300 mg three times daily- Vial  Cyclobenzaprine 5 mg as needed- Vial  Sildenafil 20 mg as needed- Vial  Lomotil - Vial Advair 500/55mcg Ventolin  Hfa Aerosol  Albuterol nebs Atrovent  Hfa Aerosol  Betamethasone/Clotrimazole   Patient is not due for an adherence delivery at this time.  Called patient and reviewed medications. Patient stated he is out of Lasix and cannot locate the medication in his packages. Per the last entered prescription, Lasix was sent to [facility] in October. Kyle Blair stated he would be leaving to [facility] next Tuesday and would like this medication prior to his departure.  Sent a request to PCP for the medication to be sent to [facility].   Coordinated acute fill for Furosemide 20 mg tab to be delivered 01-01-2021.Allayne Gitelman  Patient declined the following medications due to receiving a 90 DS 11-03-2020. Levothyroxine 200 mcg daily- Before Breakfast   Amlodipine 10 mg daily- Breakfast  Benazepril Hcl 20 mg daily- Breakfast  Pantoprazole 40 mg daily- Breakfast  Montelukast Sodium 10mg  daily- Bedtime  Lovastatin 4 0mg  daily- Bedtime Tamsulosin 0.4mg  daily- Bedtime Ropinirole 0.5 mg daily- Bedtime  Gabapentin 300 mg three times daily- Vial  Cyclobenzaprine 5 mg as needed- Vial  Sildenafil 20 mg as needed- Vial  Lomotil - Vial Advair 500/69mcg Ventolin  Hfa Aerosol  Albuterol nebs Atrovent Hfa Aerosol  Betamethasone/Clotrimazole   Patient needs  a new prescription for Furosemide 20 mg..  Confirmed delivery date of 01-01-2021, advised patient that pharmacy will contact them the morning of delivery.  Follow-Up:  Pharmacist Review   Fanny Skates, [place] Pharmacist Assistant [phone]  3 minutes spent in review, coordination, and documentation.   Reviewed by: De Blanch, PharmD, BCACP Clinical Pharmacist [facility] [phone]

## 2020-12-30 ENCOUNTER — Other Ambulatory Visit: Payer: Self-pay | Admitting: Family

## 2020-12-30 ENCOUNTER — Encounter: Payer: Self-pay | Admitting: Family

## 2020-12-30 ENCOUNTER — Ambulatory Visit (INDEPENDENT_AMBULATORY_CARE_PROVIDER_SITE_OTHER): Payer: Medicare HMO | Admitting: Family

## 2020-12-30 ENCOUNTER — Other Ambulatory Visit: Payer: Self-pay

## 2020-12-30 VITALS — BP 126/72 | HR 97 | Temp 97.8°F | Resp 16 | Wt 228.0 lb

## 2020-12-30 DIAGNOSIS — J45909 Unspecified asthma, uncomplicated: Secondary | ICD-10-CM | POA: Diagnosis not present

## 2020-12-30 MED ORDER — AZITHROMYCIN 250 MG PO TABS: ORAL_TABLET | ORAL | 0 refills | Status: DC

## 2020-12-30 MED ORDER — PREDNISONE 10 MG PO TABS: ORAL_TABLET | ORAL | 0 refills | Status: DC

## 2020-12-30 MED ORDER — METHYLPREDNISOLONE SODIUM SUCC 125 MG IJ SOLR
125.0000 mg | Freq: Once | INTRAMUSCULAR | Status: AC
  Administered 2020-12-30: 125 mg via INTRAMUSCULAR

## 2020-12-30 MED FILL — predniSONE 10 MG TABS: 10 | 12 days supply | Qty: 30 | Fill #0

## 2020-12-30 MED FILL — AZITHROMYCIN 250 MG TABLET: 250 | 5 days supply | Qty: 6 | Fill #0

## 2020-12-30 NOTE — Patient Instructions (Signed)
Please get tested for COVID-19. Begin azithromycin and prednisone taper. Please call if symptoms worsen or if symptoms fail to improve in 2-3 days. Go to the ER if you develop severe shortness of breath.  Ways to schedule a covid test at [facility]:  1.  text "covid" to [id number]    or   2.  Schedule at [email or web address]   or    3.  Call [phone]

## 2020-12-30 NOTE — Progress Notes (Signed)
Subjective:    Patient ID: Kyle Blair, male    DOB: October 25, 1961, 60 y.o.   MRN: [id number]  HPI  Patient presents today with chief complaint of asthma flare.  States that symptoms began on 12/27/19 with sore throat. Sore throat resolved but he then developed  c/o nasal chest congestion (green), tightness in his breathing, cough, SOB and wheezing. Not much improvement with his inhaler. Denies associated HA.  Denies loss of taste or smell.  States his son had similar symptoms and son took a rapid covid test last night which was negative.   Review of Systems    see HPI  Past Medical History:  Diagnosis Date  . Arthritis    severe; pt takes strong pain meds daily/legs  . Asthma    MONTH AGO  . BPH (benign prostatic hypertrophy)   . Diverticulosis   . GERD with stricture   . Hiatal hernia   . History of stroke 2004  . Hyperlipidemia   . Hypertension   . Hypothyroid   . Lung nodule 02/03/2012  . Osteoarthritis of left knee   . Polycystic kidney disease   . Stroke (HCC)    2004   EQULIBRIUM, AND JUDGING DISTANCE  . Tubular adenoma of colon      Social History   Socioeconomic History  . Marital status: Married    Spouse name: Not on file  . Number of children: 2  . Years of education: Not on file  . Highest education level: Not on file  Occupational History  . Occupation: DISABLED    Employer: UNEMPLOYED  Tobacco Use  . Smoking status: Current Every Day Smoker    Packs/day: 0.50    Years: 20.00    Pack years: 10.00    Types: Cigarettes  . Smokeless tobacco: Never Used  Vaping Use  . Vaping Use: Never used  Substance and Sexual Activity  . Alcohol use: Yes  . Drug use: No  . Sexual activity: Not on file  Other Topics Concern  . Not on file  Social History Narrative   Caffeine use:  1 pot coffee daily, 1 glass tea   Regular exercise:  No. Has active lifestyle.   2 biological and 1 stepson.   Former smoker- quit in 2007   He is on disability- reports that he  has a history of heat stroke.              Social Determinants of Health   Financial Resource Strain: Not on file  Food Insecurity: Not on file  Transportation Needs: Not on file  Physical Activity: Not on file  Stress: Not on file  Social Connections: Not on file  Intimate Partner Violence: Not on file    Past Surgical History:  Procedure Laterality Date  . CARPAL TUNNEL RELEASE    . COLONOSCOPY    . HERNIA REPAIR  2005   with mesh  . KNEE ARTHROSCOPY Bilateral 12/05/13   cyst and bone spur removed from left knee per pt.  Marland Kitchen KNEE ARTHROSCOPY Left 01/2015  . NASAL POLYP EXCISION  1992 or 93  . POLYPECTOMY    . TOTAL KNEE ARTHROPLASTY Right 05/19/2015   Procedure: RIGHT TOTAL KNEE ARTHROPLASTY;  Surgeon: Durene Romans, MD;  Location: [facility];  Service: Orthopedics;  Laterality: Right;  . TOTAL KNEE ARTHROPLASTY Left 11/19/2018   Procedure: LEFT  TOTAL KNEE ARTHROPLASTY;  Surgeon: Jodi Geralds, MD;  Location: [facility] OR;  Service: Orthopedics;  Laterality: Left;  Family History  Problem Relation Age of Onset  . Heart disease Mother        pacemaker  . Diabetes Father   . Kidney disease Father   . Diabetes Sister   . Kidney disease Sister   . Colon cancer Neg Hx   . Esophageal cancer Neg Hx   . Rectal cancer Neg Hx   . Stomach cancer Neg Hx   . Prostate cancer Neg Hx   . Pancreatic cancer Neg Hx     Allergies  Allergen Reactions  . Aspirin Anaphylaxis  . Doxycycline Hyclate Swelling    Facial Swelling  . Ibuprofen Anaphylaxis  . Jynarque [Tolvaptan] Anaphylaxis  . Peanut Butter Flavor Anaphylaxis  . Cephalexin Rash    Current Outpatient Medications on File Prior to Visit  Medication Sig Dispense Refill  . acetaminophen (TYLENOL) 325 MG tablet Take 1-2 tablets (325-650 mg total) by mouth every 6 (six) hours as needed for mild pain or moderate pain. 30 tablet 0  . ADVAIR DISKUS 500-50 MCG/DOSE AEPB INHALE ONE PUFF into THE lungs TWICE DAILY AS DIRECTED 180 each 1  .  albuterol (PROVENTIL) (2.5 MG/3ML) 0.083% nebulizer solution USE 1 VIAL VIA NEBULIZER EVERY 6 HOURS AS NEEDED FOR WHEEZING OR SHORTNESS OF BREATH 225 mL 1  . amLODipine (NORVASC) 10 MG tablet Take 1 tablet (10 mg total) by mouth daily. 90 tablet 1  . benazepril (LOTENSIN) 20 MG tablet TAKE ONE TABLET BY MOUTH EVERY MORNING 90 tablet 1  . clotrimazole-betamethasone (LOTRISONE) cream Apply 1 application topically 2 (two) times daily. 30 g 0  . diphenoxylate-atropine (LOMOTIL) 2.5-0.025 MG tablet Take 1 tablet by mouth 4 (four) times daily as needed for diarrhea or loose stools. 60 tablet 2  . fluticasone (FLOVENT HFA) 110 MCG/ACT inhaler INHALE 2 PUFFS INTO THE LUNGS DAILY WHILE IN YOUR YELLOW ZONE. RINSE MOUTH AND SPIT OUT AFTER USE. 12 g 5  . furosemide (LASIX) 20 MG tablet TAKE ONE (1) TABLET BY MOUTH EVERY DAY 30 tablet 5  . gabapentin (NEURONTIN) 300 MG capsule TAKE 1 CAPSULE BY MOUTH THREE TIMES A DAY 90 capsule 5  . levothyroxine (SYNTHROID) 200 MCG tablet TAKE ONE TABLET BY MOUTH BEFORE BREAKFAST 90 tablet 1  . lidocaine (XYLOCAINE) 5 % ointment Apply 1 application topically 3 (three) times daily as needed. 35.44 g 0  . lovastatin (MEVACOR) 40 MG tablet TAKE ONE TABLET BY MOUTH EVERYDAY AT BEDTIME 90 tablet 1  . montelukast (SINGULAIR) 10 MG tablet TAKE ONE TABLET BY MOUTH EVERYDAY AT BEDTIME 90 tablet 1  . pantoprazole (PROTONIX) 40 MG tablet Take 1 tablet (40 mg total) by mouth daily. 90 tablet 3  . rOPINIRole (REQUIP) 0.5 MG tablet TAKE ONE TABLET BY MOUTH EVERYDAY AT BEDTIME 90 tablet 1  . sildenafil (REVATIO) 20 MG tablet TAKE 1 TO 2 TABLETS BY MOUTH 30 MINUTES prior TO sexual activity 75 tablet 1  . tamsulosin (FLOMAX) 0.4 MG CAPS capsule TAKE ONE CAPSULE BY MOUTH DAILY 90 capsule 1  . VENTOLIN HFA 108 (90 Base) MCG/ACT inhaler INHALE 2 PUFFS INTO THE LUNGS EVERY 4 HOURS AS NEEDED FOR WHEEZING OR SHORTNESS OF BREATH 64 g 1  . ipratropium (ATROVENT HFA) 17 MCG/ACT inhaler Inhale 2 puffs  into the lungs every 6 (six) hours as needed for wheezing. 3 Inhaler 1   No current facility-administered medications on file prior to visit.    BP 126/72 (BP Location: Right Arm, Patient Position: Sitting, Cuff Size: Small)   Pulse 97  Temp 97.8 F (36.6 C) (Temporal)   Resp 16   Wt 228 lb (103.4 kg)   SpO2 95%   BMI 36.80 kg/m    Objective:   Physical Exam Constitutional:      General: He is not in acute distress.    Appearance: He is well-developed and well-nourished.  HENT:     Head: Normocephalic and atraumatic.     Right Ear: Tympanic membrane is erythematous. Tympanic membrane is not bulging.     Left Ear: Tympanic membrane is not erythematous or bulging.  Cardiovascular:     Rate and Rhythm: Normal rate and regular rhythm.     Heart sounds: No murmur heard.   Pulmonary:     Effort: Pulmonary effort is normal. No respiratory distress.     Breath sounds: Examination of the right-lower field reveals decreased breath sounds. Decreased breath sounds and wheezing present. No rales.  Musculoskeletal:        General: No edema.  Skin:    General: Skin is warm and dry.  Neurological:     Mental Status: He is alert and oriented to person, place, and time.  Psychiatric:        Mood and Affect: Mood and affect normal.        Behavior: Behavior normal.        Thought Content: Thought content normal.           Assessment & Plan:  Acute Asthma exacerbation- will treat with IM solumedrol 125mg  in office to be followed with prednisone taper- 10mg  tabs- 4 tabs by mouth once daily for 3 days, then 3 tabs daily x 3 days, then 2 tabs daily x 3 days, then 1 tab daily x 3 days.  I advised him to also seek COVID-19 testing.    This visit occurred during the SARS-CoV-2 public health emergency.  Safety protocols were in place, including screening questions prior to the visit, additional usage of staff PPE, and extensive cleaning of exam room while observing appropriate contact time  as indicated for disinfecting solutions.

## 2020-12-31 ENCOUNTER — Other Ambulatory Visit: Payer: Self-pay

## 2020-12-31 MED ORDER — FUROSEMIDE 20 MG PO TABS
20.0000 mg | ORAL_TABLET | Freq: Every day | ORAL | 1 refills | Status: DC
Start: 2020-12-31 — End: 2021-10-24

## 2021-01-07 ENCOUNTER — Other Ambulatory Visit: Payer: Self-pay | Admitting: Family

## 2021-01-22 ENCOUNTER — Other Ambulatory Visit: Payer: Self-pay | Admitting: Family

## 2021-01-22 NOTE — Telephone Encounter (Signed)
Okay for refill?  

## 2021-01-26 ENCOUNTER — Telehealth: Payer: Self-pay | Admitting: Pharmacist

## 2021-01-26 NOTE — Progress Notes (Addendum)
Chronic Care Management Pharmacy Assistant   Name: Kyle Blair  MRN: [id number] DOB: August 05, 1961  Reason for Encounter: Medication Review  PCP : [name], NP  Allergies:   Allergies  Allergen Reactions   Aspirin Anaphylaxis   Doxycycline Hyclate Swelling    Facial Swelling   Ibuprofen Anaphylaxis   Jynarque [Tolvaptan] Anaphylaxis   Peanut Butter Flavor Anaphylaxis   Cephalexin Rash    Medications: Outpatient Encounter Medications as of 01/26/2021  Medication Sig   acetaminophen (TYLENOL) 325 MG tablet Take 1-2 tablets (325-650 mg total) by mouth every 6 (six) hours as needed for mild pain or moderate pain.   ADVAIR DISKUS 500-50 MCG/DOSE AEPB INHALE ONE PUFF into THE lungs TWICE DAILY AS DIRECTED   albuterol (PROVENTIL) (2.5 MG/3ML) 0.083% nebulizer solution USE 1 VIAL VIA NEBULIZER EVERY 6 HOURS AS NEEDED FOR WHEEZING OR SHORTNESS OF BREATH   amLODipine (NORVASC) 10 MG tablet Take 1 tablet (10 mg total) by mouth daily.   ATROVENT HFA 17 MCG/ACT inhaler INHALE TWO PUFFS into THE lungs EVERY 6 HOURS AS NEEDED FOR wheezing   azithromycin (ZITHROMAX) 250 MG tablet Take 2 tabs by mouth today, then one tab by mouth once daily for 4 more days   benazepril (LOTENSIN) 20 MG tablet TAKE ONE TABLET BY MOUTH EVERY MORNING   clotrimazole-betamethasone (LOTRISONE) cream Apply 1 application topically 2 (two) times daily.   cyclobenzaprine (FLEXERIL) 5 MG tablet TAKE ONE TABLET BY MOUTH twice A DAY AS NEEDED FOR MUSCLE SPASMS   diphenoxylate-atropine (LOMOTIL) 2.5-0.025 MG tablet Take 1 tablet by mouth 4 (four) times daily as needed for diarrhea or loose stools.   fluticasone (FLOVENT HFA) 110 MCG/ACT inhaler INHALE 2 PUFFS INTO THE LUNGS DAILY WHILE IN YOUR YELLOW ZONE. RINSE MOUTH AND SPIT OUT AFTER USE.   furosemide (LASIX) 20 MG tablet Take 1 tablet (20 mg total) by mouth daily.   gabapentin (NEURONTIN) 300 MG capsule TAKE ONE CAPSULE BY MOUTH threes times daily    levothyroxine (SYNTHROID) 200 MCG tablet TAKE ONE TABLET BY MOUTH BEFORE BREAKFAST   lidocaine (XYLOCAINE) 5 % ointment Apply 1 application topically 3 (three) times daily as needed.   lovastatin (MEVACOR) 40 MG tablet TAKE ONE TABLET BY MOUTH EVERYDAY AT BEDTIME   montelukast (SINGULAIR) 10 MG tablet TAKE ONE TABLET BY MOUTH EVERYDAY AT BEDTIME   pantoprazole (PROTONIX) 40 MG tablet Take 1 tablet (40 mg total) by mouth daily.   predniSONE (DELTASONE) 10 MG tablet 4 tabs by mouth once daily for 3 days, then 3 tabs daily x 3 days, then 2 tabs daily x 3  days, then 1 tab daily x 3 days   rOPINIRole (REQUIP) 0.5 MG tablet TAKE ONE TABLET BY MOUTH EVERYDAY AT BEDTIME   sildenafil (REVATIO) 20 MG tablet TAKE 1 TO 2 TABLETS BY MOUTH 30 MINUTES prior TO sexual activity   tamsulosin (FLOMAX) 0.4 MG CAPS capsule TAKE ONE CAPSULE BY MOUTH DAILY   VENTOLIN HFA 108 (90 Base) MCG/ACT inhaler INHALE 2 PUFFS INTO THE LUNGS EVERY 4 HOURS AS NEEDED FOR WHEEZING OR SHORTNESS OF BREATH   No facility-administered encounter medications on file as of 01/26/2021.    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   Ingrown toenail 05/08/2018   Onychomycosis 05/08/2018   Stroke (cerebrum) ([place]) 89/38/1017   Acute metabolic  encephalopathy 01/03/2018   Tobacco abuse 01/03/2018   Acute respiratory failure ([place]) 01/19/2017   Community acquired pneumonia 01/18/2017   Influenza with pneumonia 01/18/2017   Indeterminate pulmonary nodules 07/23/2015   Obese 05/20/2015   Undiagnosed cardiac murmurs 04/15/2015   Asthma with acute exacerbation 03/13/2014   Hx of adenomatous colonic polyps 05/08/2013   Barrett's esophagus 05/03/2013   Chronic diarrhea 04/02/2013   HTN (hypertension) 04/02/2013   Hx of low back pain 09/26/2012   Arthralgia of  right knee 05/02/2012   Asthma 12/13/2011   Benign prostatic hyperplasia 12/13/2011   Hyperlipidemia 12/13/2011   GERD (gastroesophageal reflux disease) 12/13/2011   Hypothyroid    Polycystic kidney disease     Goals Addressed   None    Reviewed chart for medication changes ahead of medication coordination call.  Recent Office Visits:  [name], NP on 12/31/19 - For Acute Asthma exacerbation- STARTED Azithromycin 250 mg and prednisone 10 mg taper medication  No other medication changes indicated.  BP Readings from Last 3 Encounters:  12/30/20 126/72  12/04/20 (!) 141/77  09/28/20 118/82    Lab Results  Component Value Date   HGBA1C 5.2 01/03/2018      Third unsuccessful telephone outreach was attempted today. The patient was referred to the pharmacist for assistance with care management and care coordination.    Follow-Up:  Pharmacist Review   Charlann Lange, RMA Clinical Pharmacist Assistant [phone]  5 minutes spent in review, coordination, and documentation.  Reviewed by: Beverly Milch, PharmD Clinical Pharmacist [facility] [phone]

## 2021-02-09 ENCOUNTER — Telehealth: Payer: Self-pay | Admitting: Pharmacist

## 2021-02-09 ENCOUNTER — Telehealth: Payer: Self-pay | Admitting: Nurse Practitioner

## 2021-02-09 NOTE — Progress Notes (Addendum)
Chronic Care Management Pharmacy Assistant   Name: Kyle Blair  MRN: [id number] DOB: September 04, 1961  Reason for Encounter: Medication Review  PCP : [name], NP  Allergies:   Allergies  Allergen Reactions   Aspirin Anaphylaxis   Doxycycline Hyclate Swelling    Facial Swelling   Ibuprofen Anaphylaxis   Jynarque [Tolvaptan] Anaphylaxis   Peanut Butter Flavor Anaphylaxis   Cephalexin Rash    Medications: Outpatient Encounter Medications as of 02/09/2021  Medication Sig   acetaminophen (TYLENOL) 325 MG tablet Take 1-2 tablets (325-650 mg total) by mouth every 6 (six) hours as needed for mild pain or moderate pain.   ADVAIR DISKUS 500-50 MCG/DOSE AEPB INHALE ONE PUFF into THE lungs TWICE DAILY AS DIRECTED   albuterol (PROVENTIL) (2.5 MG/3ML) 0.083% nebulizer solution USE 1 VIAL VIA NEBULIZER EVERY 6 HOURS AS NEEDED FOR WHEEZING OR SHORTNESS OF BREATH   amLODipine (NORVASC) 10 MG tablet Take 1 tablet (10 mg total) by mouth daily.   ATROVENT HFA 17 MCG/ACT inhaler INHALE TWO PUFFS into THE lungs EVERY 6 HOURS AS NEEDED FOR wheezing   azithromycin (ZITHROMAX) 250 MG tablet Take 2 tabs by mouth today, then one tab by mouth once daily for 4 more days   benazepril (LOTENSIN) 20 MG tablet TAKE ONE TABLET BY MOUTH EVERY MORNING   clotrimazole-betamethasone (LOTRISONE) cream Apply 1 application topically 2 (two) times daily.   cyclobenzaprine (FLEXERIL) 5 MG tablet TAKE ONE TABLET BY MOUTH twice A DAY AS NEEDED FOR MUSCLE SPASMS   diphenoxylate-atropine (LOMOTIL) 2.5-0.025 MG tablet Take 1 tablet by mouth 4 (four) times daily as needed for diarrhea or loose stools.   fluticasone (FLOVENT HFA) 110 MCG/ACT inhaler INHALE 2 PUFFS INTO THE LUNGS DAILY WHILE IN YOUR YELLOW ZONE. RINSE MOUTH AND SPIT OUT AFTER USE.   furosemide (LASIX) 20 MG tablet Take 1 tablet (20 mg total) by mouth daily.   gabapentin (NEURONTIN) 300 MG capsule TAKE ONE CAPSULE BY MOUTH threes times daily    levothyroxine (SYNTHROID) 200 MCG tablet TAKE ONE TABLET BY MOUTH BEFORE BREAKFAST   lidocaine (XYLOCAINE) 5 % ointment Apply 1 application topically 3 (three) times daily as needed.   lovastatin (MEVACOR) 40 MG tablet TAKE ONE TABLET BY MOUTH EVERYDAY AT BEDTIME   montelukast (SINGULAIR) 10 MG tablet TAKE ONE TABLET BY MOUTH EVERYDAY AT BEDTIME   pantoprazole (PROTONIX) 40 MG tablet Take 1 tablet (40 mg total) by mouth daily.   predniSONE (DELTASONE) 10 MG tablet 4 tabs by mouth once daily for 3 days, then 3 tabs daily x 3 days, then 2 tabs daily x 3  days, then 1 tab daily x 3 days   rOPINIRole (REQUIP) 0.5 MG tablet TAKE ONE TABLET BY MOUTH EVERYDAY AT BEDTIME   sildenafil (REVATIO) 20 MG tablet TAKE 1 TO 2 TABLETS BY MOUTH 30 MINUTES prior TO sexual activity   tamsulosin (FLOMAX) 0.4 MG CAPS capsule TAKE ONE CAPSULE BY MOUTH DAILY   VENTOLIN HFA 108 (90 Base) MCG/ACT inhaler INHALE 2 PUFFS INTO THE LUNGS EVERY 4 HOURS AS NEEDED FOR WHEEZING OR SHORTNESS OF BREATH   No facility-administered encounter medications on file as of 02/09/2021.    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   Ingrown toenail 05/08/2018   Onychomycosis 05/08/2018   Stroke (cerebrum) ([place]) 81/82/9937   Acute metabolic  encephalopathy 01/03/2018   Tobacco abuse 01/03/2018   Acute respiratory failure ([place]) 01/19/2017   Community acquired pneumonia 01/18/2017   Influenza with pneumonia 01/18/2017   Indeterminate pulmonary nodules 07/23/2015   Obese 05/20/2015   Undiagnosed cardiac murmurs 04/15/2015   Asthma with acute exacerbation 03/13/2014   Hx of adenomatous colonic polyps 05/08/2013   Barrett's esophagus 05/03/2013   Chronic diarrhea 04/02/2013   HTN (hypertension) 04/02/2013   Hx of low back pain 09/26/2012   Arthralgia of  right knee 05/02/2012   Asthma 12/13/2011   Benign prostatic hyperplasia 12/13/2011   Hyperlipidemia 12/13/2011   GERD (gastroesophageal reflux disease) 12/13/2011   Hypothyroid    Polycystic kidney disease     Goals Addressed   None    Reviewed chart for medication changes ahead of medication coordination call.  No Consults, or hospital visits since last care coordination call.  Office Visits: 12/30/20 [name], NP. For asthma. STARTED Azithromycin 250 mg pack for 5 days and Prednisone 10 mg pack for 12 days.   BP Readings from Last 3 Encounters:  12/30/20 126/72  12/04/20 (!) 141/77  09/28/20 118/82    Lab Results  Component Value Date   HGBA1C 5.2 01/03/2018     Patient obtains medications through Adherence Packaging  90 Days   Last adherence delivery included: (90 DS on 11/03/20) Levothyroxine 200 mcg daily- Before Breakfast   Amlodipine 10 mg daily- Breakfast  Benazepril Hcl 20 mg daily- Breakfast  Pantoprazole 40 mg daily- Breakfast  Montelukast Sodium 10mg  daily- Bedtime  Lovastatin 4 0mg  daily- Bedtime Tamsulosin 0.4mg  daily- Bedtime Ropinirole 0.5 mg daily- Bedtime  Gabapentin 300 mg three times daily- Vial  Cyclobenzaprine 5 mg as needed- Vial  Sildenafil 20 mg as needed- Vial  Lomotil - Vial Advair 500/16mcg Ventolin  Hfa Aerosol  Albuterol nebs Atrovent Hfa Aerosol  Betamethasone/Clotrimazole   Patient declined meds last month: Levothyroxine 200 mcg daily- Before Breakfast   Amlodipine 10 mg daily- Breakfast  Benazepril Hcl 20 mg daily- Breakfast  Pantoprazole 40 mg daily- Breakfast  Montelukast Sodium 10mg  daily- Bedtime  Lovastatin 4 0mg  daily- Bedtime Tamsulosin 0.4mg  daily- Bedtime Ropinirole 0.5 mg daily- Bedtime  Gabapentin 300 mg three times daily- Vial  Cyclobenzaprine 5 mg as needed- Vial  Sildenafil 20 mg as needed- Vial  Lomotil - Vial Advair 500/41mcg Ventolin  Hfa Aerosol  Albuterol nebs Atrovent Hfa Aerosol   Betamethasone/Clotrimazole   Patient is due for next adherence delivery on: 02/10/21.  Called patient and reviewed medications and coordinated delivery.  This delivery to include: Levothyroxine 200 mcg daily- Before Breakfast   Amlodipine 10 mg daily- Breakfast  Benazepril Hcl 20 mg daily- Breakfast  Pantoprazole 40 mg daily- Breakfast  Montelukast Sodium 10mg  daily- Bedtime  Lovastatin 4 0mg  daily- Bedtime Tamsulosin 0.4mg  daily- Bedtime Ropinirole 0.5 mg daily- Bedtime  Furosemide 20 mg daily; 50 pills Vial ( not synced with the rest of the medications) Gabapentin 300 mg three times daily- Vial  Cyclobenzaprine 5 mg as needed- Vial  Lomotil 2.5 mg 1 tablet four times daily as needed - Vial Advair 500/19mcg Ventolin  Hfa Aerosol  Albuterol nebs Atrovent Hfa Aerosol   Patient declined the following medications: Betamethasone/Clotrimazole Sildenafil 20 mg as needed- Vial  (enough on hand)  Patient needs refills for Lomotil 2.5 mg1 tablet four times daily as needed, already requested.  Confirmed delivery date of 02/10/21, advised patient that pharmacy will contact them the morning of delivery.   Follow-Up:  Coordination of Enhanced Pharmacy  Services and Pharmacist Review   Charlann Lange, RMA Clinical Pharmacist Assistant [phone]  10 minutes spent in review, coordination, and documentation.  Reviewed by: Beverly Milch, PharmD Clinical Pharmacist [facility] [phone]

## 2021-02-09 NOTE — Telephone Encounter (Signed)
This was sent to me in error.

## 2021-02-09 NOTE — Telephone Encounter (Signed)
Please advise on refill.

## 2021-02-09 NOTE — Telephone Encounter (Signed)
Requesting refill on Lomotil

## 2021-02-13 ENCOUNTER — Other Ambulatory Visit: Payer: Self-pay | Admitting: Nurse Practitioner

## 2021-02-13 MED ORDER — DIPHENOXYLATE-ATROPINE 2.5-0.025 MG PO TABS
1.0000 | ORAL_TABLET | Freq: Four times a day (QID) | ORAL | 1 refills | Status: DC | PRN
Start: 2021-02-13 — End: 2022-09-26

## 2021-02-13 NOTE — Telephone Encounter (Signed)
I sent RX for Lomotil.

## 2021-02-14 ENCOUNTER — Other Ambulatory Visit: Payer: Self-pay | Admitting: Internal Medicine

## 2021-02-22 ENCOUNTER — Telehealth: Payer: Self-pay | Admitting: Pharmacist

## 2021-02-22 NOTE — Progress Notes (Signed)
    Chronic Care Management Pharmacy Assistant   Name: Kyle Blair  MRN: [id number] DOB: 1961-04-22  Reason for Encounter: Adherence Review  PCP : [name], NP   Verified Adherence Gap Information. Per insurance data, Their most recent A1C was 5.3 on 01/03/2018 and their most recent blood pressure was 118/82 09/28/20. The patients total gaps-all measures was equal to 2.  Reviewed the patients chart for any health and/or medication changes. There were not any changes found at this time.   Follow-Up:  Pharmacist Review   Charlann Lange, [place] Pharmacist Assistant [phone],

## 2021-02-23 ENCOUNTER — Telehealth: Payer: Self-pay | Admitting: Pharmacist

## 2021-02-23 NOTE — Progress Notes (Addendum)
Chronic Care Management Pharmacy Assistant   Name: Kyle Blair  MRN: [id number] DOB: June 03, 1961  Reason for Encounter: Medication Review  PCP : [name], NP  Allergies:   Allergies  Allergen Reactions   Aspirin Anaphylaxis   Doxycycline Hyclate Swelling    Facial Swelling   Ibuprofen Anaphylaxis   Jynarque [Tolvaptan] Anaphylaxis   Peanut Butter Flavor Anaphylaxis   Cephalexin Rash    Medications: Outpatient Encounter Medications as of 02/23/2021  Medication Sig   acetaminophen (TYLENOL) 325 MG tablet Take 1-2 tablets (325-650 mg total) by mouth every 6 (six) hours as needed for mild pain or moderate pain.   ADVAIR DISKUS 500-50 MCG/DOSE AEPB INHALE ONE PUFF into THE lungs TWICE DAILY AS DIRECTED   albuterol (PROVENTIL) (2.5 MG/3ML) 0.083% nebulizer solution USE 1 VIAL VIA NEBULIZER EVERY 6 HOURS AS NEEDED FOR WHEEZING OR SHORTNESS OF BREATH   amLODipine (NORVASC) 10 MG tablet Take 1 tablet (10 mg total) by mouth daily.   ATROVENT HFA 17 MCG/ACT inhaler INHALE TWO PUFFS into THE lungs EVERY 6 HOURS AS NEEDED FOR wheezing   azithromycin (ZITHROMAX) 250 MG tablet Take 2 tabs by mouth today, then one tab by mouth once daily for 4 more days   benazepril (LOTENSIN) 20 MG tablet TAKE ONE TABLET BY MOUTH EVERY MORNING   clotrimazole-betamethasone (LOTRISONE) cream Apply 1 application topically 2 (two) times daily.   cyclobenzaprine (FLEXERIL) 5 MG tablet TAKE ONE TABLET BY MOUTH twice A DAY AS NEEDED FOR MUSCLE SPASMS   diphenoxylate-atropine (LOMOTIL) 2.5-0.025 MG tablet Take 1 tablet by mouth 4 (four) times daily as needed for diarrhea or loose stools.   fluticasone (FLOVENT HFA) 110 MCG/ACT inhaler INHALE 2 PUFFS INTO THE LUNGS DAILY WHILE IN YOUR YELLOW ZONE. RINSE MOUTH AND SPIT OUT AFTER USE.   furosemide (LASIX) 20 MG tablet Take 1 tablet (20 mg total) by mouth daily.   gabapentin (NEURONTIN) 300 MG capsule TAKE ONE CAPSULE BY MOUTH threes times daily    levothyroxine (SYNTHROID) 200 MCG tablet TAKE ONE TABLET BY MOUTH BEFORE BREAKFAST   lidocaine (XYLOCAINE) 5 % ointment Apply 1 application topically 3 (three) times daily as needed.   lovastatin (MEVACOR) 40 MG tablet TAKE ONE TABLET BY MOUTH EVERYDAY AT BEDTIME   montelukast (SINGULAIR) 10 MG tablet TAKE ONE TABLET BY MOUTH EVERYDAY AT BEDTIME   pantoprazole (PROTONIX) 40 MG tablet Take 1 tablet (40 mg total) by mouth daily.   predniSONE (DELTASONE) 10 MG tablet 4 tabs by mouth once daily for 3 days, then 3 tabs daily x 3 days, then 2 tabs daily x 3  days, then 1 tab daily x 3 days   rOPINIRole (REQUIP) 0.5 MG tablet TAKE ONE TABLET BY MOUTH EVERYDAY AT BEDTIME   sildenafil (REVATIO) 20 MG tablet TAKE 1 TO 2 TABLETS BY MOUTH 30 MINUTES prior TO sexual activity   tamsulosin (FLOMAX) 0.4 MG CAPS capsule TAKE ONE CAPSULE BY MOUTH DAILY   VENTOLIN HFA 108 (90 Base) MCG/ACT inhaler INHALE 2 PUFFS INTO THE LUNGS EVERY 4 HOURS AS NEEDED FOR WHEEZING OR SHORTNESS OF BREATH   No facility-administered encounter medications on file as of 02/23/2021.    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   Ingrown toenail 05/08/2018   Onychomycosis 05/08/2018   Stroke (cerebrum) ([place]) 99/37/1696   Acute metabolic  encephalopathy 01/03/2018   Tobacco abuse 01/03/2018   Acute respiratory failure ([place]) 01/19/2017   Community acquired pneumonia 01/18/2017   Influenza with pneumonia 01/18/2017   Indeterminate pulmonary nodules 07/23/2015   Obese 05/20/2015   Undiagnosed cardiac murmurs 04/15/2015   Asthma with acute exacerbation 03/13/2014   Hx of adenomatous colonic polyps 05/08/2013   Barrett's esophagus 05/03/2013   Chronic diarrhea 04/02/2013   HTN (hypertension) 04/02/2013   Hx of low back pain 09/26/2012   Arthralgia of  right knee 05/02/2012   Asthma 12/13/2011   Benign prostatic hyperplasia 12/13/2011   Hyperlipidemia 12/13/2011   GERD (gastroesophageal reflux disease) 12/13/2011   Hypothyroid    Polycystic kidney disease     Goals Addressed   None    Reviewed chart for medication changes ahead of medication coordination call.  No OVs, Consults, or hospital visits since last adherence review/care coordination call.   No medication changes indicated.  BP Readings from Last 3 Encounters:  12/30/20 126/72  12/04/20 (!) 141/77  09/28/20 118/82    Lab Results  Component Value Date   HGBA1C 5.2 01/03/2018     Patient obtains medications through Adherence Packaging  90 Days   Last adherence delivery included:  Levothyroxine 200 mcg daily- Before Breakfast   Amlodipine 10 mg daily- Breakfast  Benazepril Hcl 20 mg daily- Breakfast  Pantoprazole 40 mg daily- Breakfast  Montelukast Sodium 10mg  daily- Bedtime  Lovastatin 4 0mg  daily- Bedtime Tamsulosin 0.4mg  daily- Bedtime Ropinirole 0.5 mg daily- Bedtime  Furosemide 20 mg daily; 50 pills Vial ( not synced with the rest of the medications) Gabapentin 300 mg three times daily- Vial  Cyclobenzaprine 5 mg as needed- Vial  Lomotil 2.5 mg 1 tablet four times daily as needed - Vial Advair 500/56mcg Ventolin  Hfa Aerosol  Albuterol nebs Atrovent Hfa Aerosol   Patient declined (meds) last month: Betamethasone/Clotrimazole Sildenafil 20 mg as needed- Vial  (enough on hand)  Patient is not due for an adherence delivery at this time.  Called patient and reviewed medications and coordinated delivery.  This delivery to include: None  Patient declined the following medications: Levothyroxine 200 mcg daily- Before Breakfast   Amlodipine 10 mg daily- Breakfast  Benazepril Hcl 20 mg daily- Breakfast  Pantoprazole 40 mg daily- Breakfast  Montelukast Sodium 10mg  daily- Bedtime  Lovastatin 4 0mg  daily- Bedtime Tamsulosin 0.4mg  daily-  Bedtime Ropinirole 0.5 mg daily- Bedtime  Furosemide 20 mg daily; 50 pills Vial ( not synced with the rest of the medications) Gabapentin 300 mg three times daily- Vial  Cyclobenzaprine 5 mg as needed- Vial  Lomotil 2.5 mg 1 tablet four times daily as needed - Vial Advair 500/21mcg Ventolin  Hfa Aerosol  Albuterol nebs Atrovent Hfa Aerosol  Betamethasone/Clotrimazole Sildenafil 20 mg as needed- Vial   Patient does not need any refills for at this time.   Follow-Up:  Coordination of Enhanced Pharmacy Services and Pharmacist Review   Charlann Lange, [place] Pharmacist Assistant [phone]  10 minutes spent in review, coordination, and documentation.  Reviewed by: Beverly Milch, PharmD Clinical Pharmacist [facility] [phone]

## 2021-03-02 ENCOUNTER — Ambulatory Visit: Payer: Medicare HMO | Admitting: Family

## 2021-03-02 DIAGNOSIS — Z0289 Encounter for other administrative examinations: Secondary | ICD-10-CM

## 2021-03-04 DIAGNOSIS — I129 Hypertensive chronic kidney disease with stage 1 through stage 4 chronic kidney disease, or unspecified chronic kidney disease: Secondary | ICD-10-CM | POA: Diagnosis not present

## 2021-03-04 DIAGNOSIS — E039 Hypothyroidism, unspecified: Secondary | ICD-10-CM | POA: Diagnosis not present

## 2021-03-04 DIAGNOSIS — Q446 Cystic disease of liver: Secondary | ICD-10-CM | POA: Diagnosis not present

## 2021-03-04 DIAGNOSIS — N181 Chronic kidney disease, stage 1: Secondary | ICD-10-CM | POA: Diagnosis not present

## 2021-03-04 DIAGNOSIS — Q612 Polycystic kidney, adult type: Secondary | ICD-10-CM | POA: Diagnosis not present

## 2021-03-23 ENCOUNTER — Telehealth: Payer: Self-pay | Admitting: Pharmacist

## 2021-03-23 NOTE — Progress Notes (Addendum)
Chronic Care Management Pharmacy Assistant   Name: Kyle Blair  MRN: [id number] DOB: Mar 23, 1961  Reason for Encounter: Medication Review-Medication Coordination Call.  Medications: Outpatient Encounter Medications as of 03/23/2021  Medication Sig   acetaminophen (TYLENOL) 325 MG tablet Take 1-2 tablets (325-650 mg total) by mouth every 6 (six) hours as needed for mild pain or moderate pain.   ADVAIR DISKUS 500-50 MCG/DOSE AEPB INHALE ONE PUFF into THE lungs TWICE DAILY AS DIRECTED   albuterol (PROVENTIL) (2.5 MG/3ML) 0.083% nebulizer solution USE 1 VIAL VIA NEBULIZER EVERY 6 HOURS AS NEEDED FOR WHEEZING OR SHORTNESS OF BREATH   amLODipine (NORVASC) 10 MG tablet Take 1 tablet (10 mg total) by mouth daily.   ATROVENT HFA 17 MCG/ACT inhaler INHALE TWO PUFFS into THE lungs EVERY 6 HOURS AS NEEDED FOR wheezing   azithromycin (ZITHROMAX) 250 MG tablet Take 2 tabs by mouth today, then one tab by mouth once daily for 4 more days   benazepril (LOTENSIN) 20 MG tablet TAKE ONE TABLET BY MOUTH EVERY MORNING   clotrimazole-betamethasone (LOTRISONE) cream Apply 1 application topically 2 (two) times daily.   cyclobenzaprine (FLEXERIL) 5 MG tablet TAKE ONE TABLET BY MOUTH twice A DAY AS NEEDED FOR MUSCLE SPASMS   diphenoxylate-atropine (LOMOTIL) 2.5-0.025 MG tablet Take 1 tablet by mouth 4 (four) times daily as needed for diarrhea or loose stools.   fluticasone (FLOVENT HFA) 110 MCG/ACT inhaler INHALE 2 PUFFS INTO THE LUNGS DAILY WHILE IN YOUR YELLOW ZONE. RINSE MOUTH AND SPIT OUT AFTER USE.   furosemide (LASIX) 20 MG tablet Take 1 tablet (20 mg total) by mouth daily.   gabapentin (NEURONTIN) 300 MG capsule TAKE ONE CAPSULE BY MOUTH threes times daily   levothyroxine (SYNTHROID) 200 MCG tablet TAKE ONE TABLET BY MOUTH BEFORE BREAKFAST   lidocaine (XYLOCAINE) 5 % ointment Apply 1 application topically 3 (three) times daily as needed.   lovastatin (MEVACOR) 40 MG tablet TAKE ONE TABLET BY MOUTH  EVERYDAY AT BEDTIME   montelukast (SINGULAIR) 10 MG tablet TAKE ONE TABLET BY MOUTH EVERYDAY AT BEDTIME   pantoprazole (PROTONIX) 40 MG tablet Take 1 tablet (40 mg total) by mouth daily.   predniSONE (DELTASONE) 10 MG tablet 4 tabs by mouth once daily for 3 days, then 3 tabs daily x 3 days, then 2 tabs daily x 3  days, then 1 tab daily x 3 days   rOPINIRole (REQUIP) 0.5 MG tablet TAKE ONE TABLET BY MOUTH EVERYDAY AT BEDTIME   sildenafil (REVATIO) 20 MG tablet TAKE 1 TO 2 TABLETS BY MOUTH 30 MINUTES prior TO sexual activity   tamsulosin (FLOMAX) 0.4 MG CAPS capsule TAKE ONE CAPSULE BY MOUTH DAILY   VENTOLIN HFA 108 (90 Base) MCG/ACT inhaler INHALE 2 PUFFS INTO THE LUNGS EVERY 4 HOURS AS NEEDED FOR WHEEZING OR SHORTNESS OF BREATH   No facility-administered encounter medications on file as of 03/23/2021.   Reviewed chart for medication changes ahead of medication coordination call.  No OVs, Consults, or hospital visits since last care coordination call.  No medication changes indicated.  BP Readings from Last 3 Encounters:  12/30/20 126/72  12/04/20 (!) 141/77  09/28/20 118/82    Lab Results  Component Value Date   HGBA1C 5.2 01/03/2018     Patient obtains medications through Adherence Packaging  90 Days   Last adherence delivery included:( 90 DS 02/10/21) Levothyroxine 200 mcg daily- Before Breakfast   Amlodipine 10 mg daily- Breakfast  Benazepril Hcl 20 mg daily- Breakfast  Pantoprazole  40 mg daily- Breakfast  Montelukast Sodium 10mg  daily- Bedtime  Lovastatin 4 0mg  daily- Bedtime Tamsulosin 0.4mg  daily- Bedtime Ropinirole 0.5 mg daily- Bedtime  Furosemide 20 mg daily; 50 pills Vial ( not synced with the rest of the medications) Gabapentin 300 mg three times daily- Vial  Cyclobenzaprine 5 mg as needed- Vial  Lomotil 2.5 mg 1 tablet four times daily as needed - Vial Advair 500/63mcg Ventolin  Hfa Aerosol  Albuterol nebs Atrovent Hfa Aerosol  Patient declined meds last  month: Betamethasone/Clotrimazole Sildenafil 20 mg as needed- Vial  (enough on hand)  Patient is not due for next adherence delivery at this time.  Called patient and reviewed medications.  This delivery to include: None  Patient declined the following medications: Levothyroxine 200 mcg daily- Before Breakfast   Amlodipine 10 mg daily- Breakfast  Benazepril Hcl 20 mg daily- Breakfast  Pantoprazole 40 mg daily- Breakfast  Montelukast Sodium 10mg  daily- Bedtime  Lovastatin 4 0mg  daily- Bedtime Tamsulosin 0.4mg  daily- Bedtime Ropinirole 0.5 mg daily- Bedtime  Furosemide 20 mg daily; 50 pills Vial ( not synced with the rest of the medications) Gabapentin 300 mg three times daily- Vial  Cyclobenzaprine 5 mg as needed- Vial  Lomotil 2.5 mg 1 tablet four times daily as needed - Vial Advair 500/14mcg Ventolin  Hfa Aerosol  Albuterol nebs Atrovent Hfa Aerosol  Patient does not need refills at this time. He has been at the hospital with his wife for a few days now and just does not have the time to go home and look over his medications so he just asked to not delivery any medications at this time.    Follow-Up: Pharmacist Review   Charlann Lange, RMA Clinical Pharmacist Assistant [phone]  10 minutes spent in review, coordination, and documentation.  Reviewed by: Beverly Milch, PharmD Clinical Pharmacist [facility] [phone]

## 2021-03-25 ENCOUNTER — Ambulatory Visit (INDEPENDENT_AMBULATORY_CARE_PROVIDER_SITE_OTHER): Payer: Medicare HMO

## 2021-03-25 ENCOUNTER — Encounter: Payer: Self-pay | Admitting: Podiatrist

## 2021-03-25 ENCOUNTER — Other Ambulatory Visit: Payer: Self-pay

## 2021-03-25 ENCOUNTER — Ambulatory Visit: Payer: Medicare HMO | Admitting: Podiatrist

## 2021-03-25 DIAGNOSIS — M722 Plantar fascial fibromatosis: Secondary | ICD-10-CM | POA: Diagnosis not present

## 2021-03-25 MED ORDER — TRIAMCINOLONE ACETONIDE 40 MG/ML IJ SUSP
20.0000 mg | Freq: Once | INTRAMUSCULAR | Status: AC
  Administered 2021-03-25: 20 mg

## 2021-03-25 NOTE — Patient Instructions (Signed)

## 2021-03-25 NOTE — Progress Notes (Signed)
Subjective: Kyle Blair is a 60 y.o. male patient presents to office with complaint of moderate heel pain on the right heel. 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 Patient has treated this problem with padding in his shoes with no relief. He is unable to take aspirin or ibuprofen due to allergies.  He also has toenails which are long and painful in shoes when they get too long.  He is unable to trim them himself as he had bilateral knee surgery and has trouble reaching his toes.   Patient Active Problem List   Diagnosis Date Noted  . Diarrhea 09/28/2020  . Primary osteoarthritis of left knee 11/19/2018  . OSA (obstructive sleep apnea) 06/02/2018  . 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 Ingrown toenail 05/08/2018  . Onychomycosis 05/08/2018  . Stroke (cerebrum) ([place]) 01/03/2018  . Acute metabolic encephalopathy 75/09/2584  . Tobacco abuse 01/03/2018  . Acute respiratory failure ([place]) 01/19/2017  . Community acquired pneumonia 01/18/2017  . Influenza with pneumonia 01/18/2017  . Indeterminate pulmonary nodules 07/23/2015  . Obese 05/20/2015  . Undiagnosed cardiac murmurs 04/15/2015  . Asthma with acute exacerbation 03/13/2014  . Hx of adenomatous colonic polyps 05/08/2013  . Barrett's esophagus 05/03/2013  . Chronic diarrhea 04/02/2013  . HTN (hypertension) 04/02/2013  . Hx of low back pain 09/26/2012  . Arthralgia of right knee 05/02/2012  . Asthma 12/13/2011  . Benign prostatic hyperplasia 12/13/2011  . Hyperlipidemia 12/13/2011  . GERD (gastroesophageal reflux disease) 12/13/2011  . Hypothyroid   . Polycystic kidney disease     Current Outpatient Medications on File Prior to Visit  Medication Sig Dispense Refill  . acetaminophen (TYLENOL) 325 MG tablet Take 1-2 tablets (325-650 mg total) by mouth every 6 (six) hours as needed for mild pain or moderate pain. 30 tablet 0  . ADVAIR DISKUS 500-50 MCG/DOSE AEPB INHALE ONE PUFF into THE lungs TWICE  DAILY AS DIRECTED 180 each 1  . albuterol (PROVENTIL) (2.5 MG/3ML) 0.083% nebulizer solution USE 1 VIAL VIA NEBULIZER EVERY 6 HOURS AS NEEDED FOR WHEEZING OR SHORTNESS OF BREATH 225 mL 1  . amLODipine (NORVASC) 10 MG tablet Take 1 tablet (10 mg total) by mouth daily. 90 tablet 1  . ATROVENT HFA 17 MCG/ACT inhaler INHALE TWO PUFFS into THE lungs EVERY 6 HOURS AS NEEDED FOR wheezing 38.7 g 1  . azithromycin (ZITHROMAX) 250 MG tablet Take 2 tabs by mouth today, then one tab by mouth once daily for 4 more days 6 tablet 0  . benazepril (LOTENSIN) 20 MG tablet TAKE ONE TABLET BY MOUTH EVERY MORNING 90 tablet 1  . clotrimazole-betamethasone (LOTRISONE) cream Apply 1 application topically 2 (two) times daily. 30 g 0  . cyclobenzaprine (FLEXERIL) 5 MG tablet TAKE ONE TABLET BY MOUTH twice A DAY AS NEEDED FOR MUSCLE SPASMS 180 tablet 1  . diphenoxylate-atropine (LOMOTIL) 2.5-0.025 MG tablet Take 1 tablet by mouth 4 (four) times daily as needed for diarrhea or loose stools. 60 tablet 1  . fluticasone (FLOVENT HFA) 110 MCG/ACT inhaler INHALE 2 PUFFS INTO THE LUNGS DAILY WHILE IN YOUR YELLOW ZONE. RINSE MOUTH AND SPIT OUT AFTER USE. 12 g 5  . furosemide (LASIX) 20 MG tablet Take 1 tablet (20 mg total) by mouth daily. 90 tablet 1  . gabapentin (NEURONTIN) 300 MG capsule TAKE ONE CAPSULE BY MOUTH threes times daily 90 capsule 5  . levothyroxine (SYNTHROID) 200 MCG tablet TAKE ONE TABLET BY MOUTH BEFORE BREAKFAST 90 tablet 1  . lidocaine (  XYLOCAINE) 5 % ointment Apply 1 application topically 3 (three) times daily as needed. 35.44 g 0  . lovastatin (MEVACOR) 40 MG tablet TAKE ONE TABLET BY MOUTH EVERYDAY AT BEDTIME 90 tablet 1  . montelukast (SINGULAIR) 10 MG tablet TAKE ONE TABLET BY MOUTH EVERYDAY AT BEDTIME 90 tablet 1  . pantoprazole (PROTONIX) 40 MG tablet Take 1 tablet (40 mg total) by mouth daily. 90 tablet 3  . predniSONE (DELTASONE) 10 MG tablet 4 tabs by mouth once daily for 3 days, then 3 tabs daily x 3  days, then 2 tabs daily x 3  days, then 1 tab daily x 3 days 30 tablet 0  . rOPINIRole (REQUIP) 0.5 MG tablet TAKE ONE TABLET BY MOUTH EVERYDAY AT BEDTIME 90 tablet 1  . sildenafil (REVATIO) 20 MG tablet TAKE 1 TO 2 TABLETS BY MOUTH 30 MINUTES prior TO sexual activity 75 tablet 1  . tamsulosin (FLOMAX) 0.4 MG CAPS capsule TAKE ONE CAPSULE BY MOUTH DAILY 90 capsule 1  . VENTOLIN HFA 108 (90 Base) MCG/ACT inhaler INHALE 2 PUFFS INTO THE LUNGS EVERY 4 HOURS AS NEEDED FOR WHEEZING OR SHORTNESS OF BREATH 64 g 1   No current facility-administered medications on file prior to visit.    Allergies  Allergen Reactions  . Aspirin Anaphylaxis  . Doxycycline Hyclate Swelling    Facial Swelling  . Ibuprofen Anaphylaxis  . Jynarque [Tolvaptan] Anaphylaxis  . Peanut Butter Flavor Anaphylaxis  . Cephalexin Rash    Objective: Physical Exam General: The patient is alert and oriented x3 in no acute distress.  Dermatology: Skin is warm, dry and supple bilateral lower extremities. Nails 1-10 are thick, discolored, brittle and tender with palpation.  There is no erythema, edema, no eccymosis, no open lesions present. Hyperkeratotic skin is also present on the medial heel of bilateral feet.   Vascular: Dorsalis Pedis pulse and Posterior Tibial pulse are 2/4 bilateral. Capillary fill time is immediate to all digits.  Neurological: Grossly intact to light touch with an achilles reflex of +2/5 and a  negative Tinel's sign bilateral.  Musculoskeletal: Tenderness to palpation at the medial calcaneal tubercale and through the insertion of the plantar fascia on the Rightfoot. No pain with compression of calcaneus bilateral. No pain with tuning fork to calcaneus bilateral. No pain with calf compression bilateral. There is decreased Ankle joint range of motion bilateral. All other joints range of motion within normal limits bilateral. Strength 5/5 in all groups bilateral.   Gait: Unassisted, Antalgic avoid weight on  right heel  Xray, rightfoot:  Normal osseous mineralization. Joint spaces preserved. No fracture/dislocation/boney destruction. On Xray mild thickening of plantar fascia is noted.  Calcaneal inclination angle increased.  Talar delination angle increased on lateral view. It also appears he kept his heel off the weight bearing surface.  On the ap views metatarsus adductus deformity is noted. . No other soft tissue abnormalities or radiopaque foreign bodies seen.   Assessment and Plan: Problem List Items Addressed This Visit   None   Visit Diagnoses    Plantar fasciitis of right foot    -  Primary   Relevant Medications   triamcinolone acetonide (KENALOG-40) injection 20 mg (Completed)   Other Relevant Orders   DG Foot Complete Right (Completed)      -Complete examination performed.  -Xrays reviewed -Discussed with patient in detail the condition of plantar fasciitis, how this occurs and general treatment options. -After oral consent and aseptic prep, injected a mixture containing 20mg  Kenalog and  5mg  marcaine plain was infiltrated into the area of maximal tenderness of the Right heel. Post-injection care discussed with patient.  -Recommended good supportive shoes .  He has a pair of shoes at home which are more supportive he will wear for the next several weeks-   - will consider powerstep at next visit if able to tolerate.  -Explained and dispensed to patient daily stretching exercises.. -Patient to return to office in 4 weeks. Will also do a nail trim for him at this visit.

## 2021-04-01 ENCOUNTER — Telehealth: Payer: Self-pay | Admitting: Pharmacist

## 2021-04-01 NOTE — Progress Notes (Addendum)
Chronic Care Management Pharmacy Assistant   Name: Kyle Blair  MRN: [id number] DOB: 06/01/1961  Reason for Encounter: Medication Review  Medications: Outpatient Encounter Medications as of 04/01/2021  Medication Sig   acetaminophen (TYLENOL) 325 MG tablet Take 1-2 tablets (325-650 mg total) by mouth every 6 (six) hours as needed for mild pain or moderate pain.   ADVAIR DISKUS 500-50 MCG/DOSE AEPB INHALE ONE PUFF into THE lungs TWICE DAILY AS DIRECTED   albuterol (PROVENTIL) (2.5 MG/3ML) 0.083% nebulizer solution USE 1 VIAL VIA NEBULIZER EVERY 6 HOURS AS NEEDED FOR WHEEZING OR SHORTNESS OF BREATH   amLODipine (NORVASC) 10 MG tablet Take 1 tablet (10 mg total) by mouth daily.   ATROVENT HFA 17 MCG/ACT inhaler INHALE TWO PUFFS into THE lungs EVERY 6 HOURS AS NEEDED FOR wheezing   azithromycin (ZITHROMAX) 250 MG tablet TAKE 2 TABLETS BY MOUTH ON DAY 1 THEN TAKE 1 TABLET DAILY FOR THE NEXT 4 DAYS   benazepril (LOTENSIN) 20 MG tablet TAKE ONE TABLET BY MOUTH EVERY MORNING   clotrimazole-betamethasone (LOTRISONE) cream Apply 1 application topically 2 (two) times daily.   cyclobenzaprine (FLEXERIL) 5 MG tablet TAKE ONE TABLET BY MOUTH twice A DAY AS NEEDED FOR MUSCLE SPASMS   diphenoxylate-atropine (LOMOTIL) 2.5-0.025 MG tablet Take 1 tablet by mouth 4 (four) times daily as needed for diarrhea or loose stools.   fluticasone (FLOVENT HFA) 110 MCG/ACT inhaler INHALE 2 PUFFS INTO THE LUNGS DAILY WHILE IN YOUR YELLOW ZONE. RINSE MOUTH AND SPIT OUT AFTER USE.   furosemide (LASIX) 20 MG tablet Take 1 tablet (20 mg total) by mouth daily.   gabapentin (NEURONTIN) 300 MG capsule TAKE ONE CAPSULE BY MOUTH threes times daily   levothyroxine (SYNTHROID) 200 MCG tablet TAKE ONE TABLET BY MOUTH BEFORE BREAKFAST   lidocaine (XYLOCAINE) 5 % ointment Apply 1 application topically 3 (three) times daily as needed.   lovastatin (MEVACOR) 40 MG tablet TAKE ONE TABLET BY MOUTH EVERYDAY AT BEDTIME   montelukast  (SINGULAIR) 10 MG tablet TAKE ONE TABLET BY MOUTH EVERYDAY AT BEDTIME   pantoprazole (PROTONIX) 40 MG tablet Take 1 tablet (40 mg total) by mouth daily.   predniSONE (DELTASONE) 10 MG tablet TAKE 4 TABLETS BY MOUTH ONCE DAILY FOR 3 DAYS, THEN 3 TABLETS DAILY FOR 3 DAYS, THEN 2 TABLETS DAILY FOR 3 DAYS, THEN 1 TABLET DAILY FOR 3 DAYS   rOPINIRole (REQUIP) 0.5 MG tablet TAKE ONE TABLET BY MOUTH EVERYDAY AT BEDTIME   sildenafil (REVATIO) 20 MG tablet TAKE 1 TO 2 TABLETS BY MOUTH 30 MINUTES prior TO sexual activity   tamsulosin (FLOMAX) 0.4 MG CAPS capsule TAKE ONE CAPSULE BY MOUTH DAILY   VENTOLIN HFA 108 (90 Base) MCG/ACT inhaler INHALE 2 PUFFS INTO THE LUNGS EVERY 4 HOURS AS NEEDED FOR WHEEZING OR SHORTNESS OF BREATH   No facility-administered encounter medications on file as of 04/01/2021.    Called patient to follow up with him about his concern on his missing med box. He was at home and he was able to verify that he had enough medication for three months as he usually does. The change was that the medications were placed in two boxes instead of three. I directed him on where to look on the box for start and finish date. He also voiced that he did not need any more medications at this time after we went over them all. We both agreed that I would check back with him at either the end of April  or the beginning of the May.  Consults Visits:  03/25/21 Podiatry Kyle Blair, DPM. For plantar fasciitis of right foot. Per note: Encouraged exercises. No medication changes.    Follow-Up:Pharmacist Review  Charlann Lange, RMA Clinical Pharmacist Assistant [phone]  10 minutes spent in review, coordination, and documentation.  Reviewed by: Beverly Milch, PharmD Clinical Pharmacist [facility] [phone]

## 2021-04-02 ENCOUNTER — Other Ambulatory Visit (HOSPITAL_BASED_OUTPATIENT_CLINIC_OR_DEPARTMENT_OTHER): Payer: Self-pay

## 2021-04-02 ENCOUNTER — Encounter: Payer: Self-pay | Admitting: Family Medicine

## 2021-04-02 ENCOUNTER — Other Ambulatory Visit: Payer: Self-pay

## 2021-04-02 ENCOUNTER — Ambulatory Visit (INDEPENDENT_AMBULATORY_CARE_PROVIDER_SITE_OTHER): Payer: Medicare HMO | Admitting: Family Medicine

## 2021-04-02 VITALS — BP 132/86 | HR 98 | Temp 98.0°F | Ht 66.0 in | Wt 228.1 lb

## 2021-04-02 DIAGNOSIS — L509 Urticaria, unspecified: Secondary | ICD-10-CM

## 2021-04-02 MED ORDER — FAMOTIDINE 20 MG PO TABS: 20.0000 mg | ORAL_TABLET | Freq: Two times a day (BID) | ORAL | 0 refills | Status: DC

## 2021-04-02 MED ORDER — LEVOCETIRIZINE DIHYDROCHLORIDE 5 MG PO TABS
5.0000 mg | ORAL_TABLET | Freq: Every evening | ORAL | 0 refills | Status: DC
  Filled 2021-04-02: qty 30, 30d supply, fill #0

## 2021-04-02 MED ORDER — PREDNISONE 20 MG PO TABS: 40.0000 mg | ORAL_TABLET | Freq: Every day | ORAL | 0 refills | Status: DC

## 2021-04-02 MED ORDER — PREDNISONE 20 MG PO TABS
40.0000 mg | ORAL_TABLET | Freq: Every day | ORAL | 0 refills | Status: AC
  Filled 2021-04-02: qty 10, 5d supply, fill #0

## 2021-04-02 MED ORDER — FAMOTIDINE 20 MG PO TABS
20.0000 mg | ORAL_TABLET | Freq: Two times a day (BID) | ORAL | 0 refills | Status: DC
Start: 2021-04-02 — End: 2022-09-26
  Filled 2021-04-02: qty 14, 7d supply, fill #0

## 2021-04-02 MED ORDER — LEVOCETIRIZINE DIHYDROCHLORIDE 5 MG PO TABS: 5.0000 mg | ORAL_TABLET | Freq: Every evening | ORAL | 0 refills | Status: DC

## 2021-04-02 MED ORDER — METHYLPREDNISOLONE ACETATE 80 MG/ML IJ SUSP
80.0000 mg | Freq: Once | INTRAMUSCULAR | Status: AC
Start: 2021-04-02 — End: 2021-04-02
  Administered 2021-04-02: 80 mg via INTRAMUSCULAR

## 2021-04-02 NOTE — Patient Instructions (Addendum)
Try not to itch.  Avoid scented products.  Ice/cold pack over area for 10-15 min twice daily.  If you start having worsening symptoms, go to the ER.   Famotidine/Pepcid 20 mg daily is available over the counter.   Claritin (loratadine), Allegra (fexofenadine), Zyrtec (cetirizine) which is also equivalent to Xyzal (levocetirizine); these are listed in order from weakest to strongest. Generic, and therefore cheaper, options are in the parentheses.   There are available OTC, and the generic versions, which may be cheaper, are in parentheses. Show this to a pharmacist if you have trouble finding any of these items.  Let us know if you need anything.

## 2021-04-02 NOTE — Addendum Note (Signed)
Addended by: Sharon Seller B on: 04/02/2021 04:03 PM   Modules accepted: Orders

## 2021-04-02 NOTE — Progress Notes (Signed)
Chief Complaint  Patient presents with  . Urticaria    CARLIN ATTRIDGE is a 60 y.o. male here for a skin complaint.  Duration: 14 days Location: entire body Pruritic? Yes Painful? No Drainage? No New soaps/lotions/topicals/detergents? No Sick contacts? No Other associated symptoms: no fevers Therapies tried thus far: Benadryl  Past Medical History:  Diagnosis Date  . Arthritis    severe; pt takes strong pain meds daily/legs  . Asthma    MONTH AGO  . BPH (benign prostatic hypertrophy)   . Diverticulosis   . GERD with stricture   . Hiatal hernia   . History of stroke 2004  . Hyperlipidemia   . Hypertension   . Hypothyroid   . Lung nodule 02/03/2012  . Osteoarthritis of left knee   . Polycystic kidney disease   . Stroke ([place])    2004   EQULIBRIUM, AND JUDGING DISTANCE  . Tubular adenoma of colon     BP 132/86 (BP Location: Left Arm, Patient Position: Sitting, Cuff Size: Normal)   Pulse 98   Temp 98 F (36.7 C) (Oral)   Ht 5\' 6"  (1.676 m)   Wt 228 lb 2 oz (103.5 kg)   SpO2 96%   BMI 36.82 kg/m  Gen: awake, alert, appearing stated age Lungs: No accessory muscle use Skin: See below. No drainage, erythema, TTP, fluctuance. It does blanch.  Psych: Age appropriate judgment and insight      Urticaria - Plan: predniSONE (DELTASONE) 20 MG tablet, levocetirizine (XYZAL) 5 MG tablet, famotidine (PEPCID) 20 MG tablet  80 mg IM Depo today. Start 5 d pred burst tomorrow, 40 mg/d. Orders as above. OTC items listed in AVS.  ER if worsening s/s's.  F/u prn. The patient voiced understanding and agreement to the plan.  [place], DO 04/02/21 3:35 PM

## 2021-04-05 ENCOUNTER — Telehealth: Payer: Self-pay | Admitting: Family

## 2021-04-05 NOTE — Telephone Encounter (Signed)
Patient was seen by Dr. Rosita Kea on 04/02/21. Patient states he is well and would like to thank you for your amazing services. He is very pleased.

## 2021-04-12 ENCOUNTER — Encounter: Payer: Self-pay | Admitting: Podiatrist

## 2021-04-12 ENCOUNTER — Ambulatory Visit: Payer: Medicare HMO | Admitting: Podiatrist

## 2021-04-12 ENCOUNTER — Other Ambulatory Visit: Payer: Self-pay

## 2021-04-12 DIAGNOSIS — M79675 Pain in left toe(s): Secondary | ICD-10-CM

## 2021-04-12 DIAGNOSIS — M722 Plantar fascial fibromatosis: Secondary | ICD-10-CM

## 2021-04-12 DIAGNOSIS — M79674 Pain in right toe(s): Secondary | ICD-10-CM

## 2021-04-12 DIAGNOSIS — B351 Tinea unguium: Secondary | ICD-10-CM | POA: Diagnosis not present

## 2021-04-12 MED ORDER — TRIAMCINOLONE ACETONIDE 10 MG/ML IJ SUSP
10.0000 mg | Freq: Once | INTRAMUSCULAR | Status: AC
  Administered 2021-04-12: 10 mg

## 2021-04-12 NOTE — Progress Notes (Signed)
   Chief Complaint  Patient presents with  . Follow-up    F/u plantar fasciitis pt states shot helped but believes shot is wearing off would like another injection as pt states he was on his feet all weekend    . Nail Problem    Nail trim      HPI: Patient is 60 y.o. male who presents today for follow up of plantar fasciitis in the right heel.  He relates the shot did well for a while but he was up on his feet a lot the last few days and has noticed the pain has started to return.  The patient also relates his toenails are giving him trouble and would like to have them trimmed today as well.    Allergies  Allergen Reactions  . Aspirin Anaphylaxis  . Doxycycline Hyclate Swelling    Facial Swelling  . Ibuprofen Anaphylaxis  . Jynarque [Tolvaptan] Anaphylaxis  . Peanut Butter Flavor Anaphylaxis  . Cephalexin Rash    Review of systems is reviewed and negative.    Objective: Physical Exam General: The patient is alert and oriented x3 in no acute distress.   Dermatology: Skin is warm, dry and supple bilateral lower extremities. Nails of the great toe bilaterally are thick, discolored, brittle and tender with palpation.  Bilateral hallux nails also have incurvated corners which become painful as well.  There is no erythema, edema, no eccymosis, no open lesions present. Hyperkeratotic skin is also present on the medial heel of bilateral feet.    Vascular: Dorsalis Pedis pulse and Posterior Tibial pulse are 2/4 bilateral. Capillary fill time is immediate to all digits.   Neurological: Grossly intact to light touch with an achilles reflex of +2/5 and a  negative Tinel's sign bilateral.   Musculoskeletal: Tenderness to palpation at the medial calcaneal tubercale and through the insertion of the plantar fascia on the Right foot-  Improved since the last visit but still present. No pain with compression of calcaneus bilateral. No pain with tuning fork to calcaneus bilateral. No pain with  calf compression bilateral. There is decreased Ankle joint range of motion bilateral. All other joints range of motion within normal limits bilateral. Strength 5/5 in all groups bilateral.      Assessment:   ICD-10-CM   1. Plantar fasciitis of right foot  M72.2   2. Pain due to onychomycosis of toenails of both feet  B35.1    M79.675    M79.674      Plan: 1)Discussed treatment options and at this time a plantar fascial injection was recommended.   The patient agreed and a sterile skin prep was applied.  An injection consisting of kenalog and marcaine mixture was infiltrated at the point of maximal tenderness on the right Heel for injection #2..  The patient tolerated this well and was given instructions for aftercare.  2) Debridement of toenails was recommended.  Onychoreduction of symptomatic toenails was performed via nail nipper and power burr without iatrogenic incident.  Patient was instructed on signs and symptoms of infection and was told to call immediately should any of these arise.

## 2021-04-27 ENCOUNTER — Other Ambulatory Visit: Payer: Self-pay

## 2021-04-27 ENCOUNTER — Encounter: Payer: Self-pay | Admitting: Family

## 2021-04-27 ENCOUNTER — Ambulatory Visit (INDEPENDENT_AMBULATORY_CARE_PROVIDER_SITE_OTHER): Payer: Medicare HMO | Admitting: Family

## 2021-04-27 ENCOUNTER — Other Ambulatory Visit (HOSPITAL_BASED_OUTPATIENT_CLINIC_OR_DEPARTMENT_OTHER): Payer: Self-pay

## 2021-04-27 VITALS — BP 120/79 | HR 102 | Temp 98.2°F | Resp 16 | Ht 66.0 in | Wt 218.0 lb

## 2021-04-27 DIAGNOSIS — L509 Urticaria, unspecified: Secondary | ICD-10-CM | POA: Diagnosis not present

## 2021-04-27 DIAGNOSIS — J45901 Unspecified asthma with (acute) exacerbation: Secondary | ICD-10-CM

## 2021-04-27 DIAGNOSIS — R21 Rash and other nonspecific skin eruption: Secondary | ICD-10-CM | POA: Diagnosis not present

## 2021-04-27 MED ORDER — PREDNISONE 20 MG PO TABS
40.0000 mg | ORAL_TABLET | Freq: Every day | ORAL | 0 refills | Status: DC
  Filled 2021-04-27: qty 10, 5d supply, fill #0

## 2021-04-27 MED ORDER — METHYLPREDNISOLONE SODIUM SUCC 125 MG IJ SOLR
125.0000 mg | Freq: Once | INTRAMUSCULAR | Status: AC
Start: 2021-04-27 — End: 2021-04-27
  Administered 2021-04-27: 125 mg via INTRAMUSCULAR

## 2021-04-27 MED ORDER — AZITHROMYCIN 250 MG PO TABS
ORAL_TABLET | ORAL | 0 refills | Status: AC
  Filled 2021-04-27: qty 6, 5d supply, fill #0

## 2021-04-27 MED ORDER — LEVOCETIRIZINE DIHYDROCHLORIDE 5 MG PO TABS
5.0000 mg | ORAL_TABLET | Freq: Every evening | ORAL | 5 refills | Status: AC
  Filled 2021-04-27: qty 30, 30d supply, fill #0

## 2021-04-27 NOTE — Patient Instructions (Signed)
Please complete lab work prior to leaving. Start prednisone in the AM. Start azithromycin tonight for bronchitis.  Continue xyzal (antihistamine). Call if symptoms worsen or if not improved in 4 days.

## 2021-04-27 NOTE — Assessment & Plan Note (Addendum)
Most likely urticaria, however I have seen syphilis rash look similar. Will rx solumedrol 125mg  IM in clinic, to be followed by prednisone 40mg  once daily for 5 days and continue xyzal. Will check RPR to exclude syphilis.

## 2021-04-27 NOTE — Assessment & Plan Note (Signed)
Will rx with empiric azithromycin. Will also treat with prednisone for rash and asthma. He may require a longer prednisone taper.  I have advised him to reach out to me on day 3-4 if he continues to wheeze and I will extend his taper.

## 2021-04-27 NOTE — Progress Notes (Signed)
Established Patient Office Visit  Subjective:  Patient ID: Kyle Blair, male    DOB: [birth year]  Age: 60 y.o. MRN: [id number]  CC:  Chief Complaint  Patient presents with  . Urticaria    Complains of hives "all over body"    HPI Kyle Blair presents for c/o urticaria. Had similar rash last month. Was resolved with solumedrol and prednisone burst.  Symptoms came back 1 week later.  Past Medical History:  Diagnosis Date  . Arthritis    severe; pt takes strong pain meds daily/legs  . Asthma    MONTH AGO  . BPH (benign prostatic hypertrophy)   . Diverticulosis   . GERD with stricture   . Hiatal hernia   . History of stroke 2004  . Hyperlipidemia   . Hypertension   . Hypothyroid   . Lung nodule 02/03/2012  . Osteoarthritis of left knee   . Polycystic kidney disease   . Stroke ([place])    2004   EQULIBRIUM, AND JUDGING DISTANCE  . Tubular adenoma of colon     Past Surgical History:  Procedure Laterality Date  . CARPAL TUNNEL RELEASE    . COLONOSCOPY    . HERNIA REPAIR  2005   with mesh  . KNEE ARTHROSCOPY Bilateral 12/05/13   cyst and bone spur removed from left knee per pt.  Marland Kitchen KNEE ARTHROSCOPY Left 01/2015  . NASAL POLYP EXCISION  1992 or 93  . POLYPECTOMY    . TOTAL KNEE ARTHROPLASTY Right 05/19/2015   Procedure: RIGHT TOTAL KNEE ARTHROPLASTY;  Surgeon: Paralee Cancel, MD;  Location: [facility];  Service: Orthopedics;  Laterality: Right;  . TOTAL KNEE ARTHROPLASTY Left 11/19/2018   Procedure: LEFT  TOTAL KNEE ARTHROPLASTY;  Surgeon: Dorna Leitz, MD;  Location: [place];  Service: Orthopedics;  Laterality: Left;    Family History  Problem Relation Age of Onset  . Heart disease Mother        pacemaker  . Diabetes Father   . Kidney disease Father   . Diabetes Sister   . Kidney disease Sister   . Colon cancer Neg Hx   . Esophageal cancer Neg Hx   . Rectal cancer Neg Hx   . Stomach cancer Neg Hx   . Prostate cancer Neg Hx   . Pancreatic cancer Neg Hx      Social History   Socioeconomic History  . Marital status: Married    Spouse name: Not on file  . Number of children: 2  . Years of education: Not on file  . Highest education level: Not on file  Occupational History  . Occupation: DISABLED    Employer: UNEMPLOYED  Tobacco Use  . Smoking status: Current Every Day Smoker    Packs/day: 0.50    Years: 20.00    Pack years: 10.00    Types: Cigarettes  . Smokeless tobacco: Never Used  Vaping Use  . Vaping Use: Never used  Substance and Sexual Activity  . Alcohol use: Yes  . Drug use: No  . Sexual activity: Not on file  Other Topics Concern  . Not on file  Social History Narrative   Caffeine use:  1 pot coffee daily, 1 glass tea   Regular exercise:  No. Has active lifestyle.   2 biological and 1 stepson.   Former smoker- quit in 2007   He is on disability- reports that he has a history of heat stroke.  Social Determinants of Health   Financial Resource Strain: Not on file  Food Insecurity: Not on file  Transportation Needs: Not on file  Physical Activity: Not on file  Stress: Not on file  Social Connections: Not on file  Intimate Partner Violence: Not on file    Outpatient Medications Prior to Visit  Medication Sig Dispense Refill  . acetaminophen (TYLENOL) 325 MG tablet Take 1-2 tablets (325-650 mg total) by mouth every 6 (six) hours as needed for mild pain or moderate pain. 30 tablet 0  . ADVAIR DISKUS 500-50 MCG/DOSE AEPB INHALE ONE PUFF into THE lungs TWICE DAILY AS DIRECTED 180 each 1  . albuterol (PROVENTIL) (2.5 MG/3ML) 0.083% nebulizer solution USE 1 VIAL VIA NEBULIZER EVERY 6 HOURS AS NEEDED FOR WHEEZING OR SHORTNESS OF BREATH 225 mL 1  . amLODipine (NORVASC) 10 MG tablet Take 1 tablet (10 mg total) by mouth daily. 90 tablet 1  . ATROVENT HFA 17 MCG/ACT inhaler INHALE TWO PUFFS into THE lungs EVERY 6 HOURS AS NEEDED FOR wheezing 38.7 g 1  . benazepril (LOTENSIN) 20 MG tablet TAKE ONE TABLET BY  MOUTH EVERY MORNING 90 tablet 1  . cyclobenzaprine (FLEXERIL) 5 MG tablet TAKE ONE TABLET BY MOUTH twice A DAY AS NEEDED FOR MUSCLE SPASMS 180 tablet 1  . diphenoxylate-atropine (LOMOTIL) 2.5-0.025 MG tablet Take 1 tablet by mouth 4 (four) times daily as needed for diarrhea or loose stools. 60 tablet 1  . fluticasone (FLOVENT HFA) 110 MCG/ACT inhaler INHALE 2 PUFFS INTO THE LUNGS DAILY WHILE IN YOUR YELLOW ZONE. RINSE MOUTH AND SPIT OUT AFTER USE. 12 g 5  . furosemide (LASIX) 20 MG tablet Take 1 tablet (20 mg total) by mouth daily. 90 tablet 1  . levothyroxine (SYNTHROID) 200 MCG tablet TAKE ONE TABLET BY MOUTH BEFORE BREAKFAST 90 tablet 1  . lidocaine (XYLOCAINE) 5 % ointment Apply 1 application topically 3 (three) times daily as needed. 35.44 g 0  . lovastatin (MEVACOR) 40 MG tablet TAKE ONE TABLET BY MOUTH EVERYDAY AT BEDTIME 90 tablet 1  . montelukast (SINGULAIR) 10 MG tablet TAKE ONE TABLET BY MOUTH EVERYDAY AT BEDTIME 90 tablet 1  . pantoprazole (PROTONIX) 40 MG tablet Take 1 tablet (40 mg total) by mouth daily. 90 tablet 3  . rOPINIRole (REQUIP) 0.5 MG tablet TAKE ONE TABLET BY MOUTH EVERYDAY AT BEDTIME 90 tablet 1  . sildenafil (REVATIO) 20 MG tablet TAKE 1 TO 2 TABLETS BY MOUTH 30 MINUTES prior TO sexual activity 75 tablet 1  . tamsulosin (FLOMAX) 0.4 MG CAPS capsule TAKE ONE CAPSULE BY MOUTH DAILY 90 capsule 1  . VENTOLIN HFA 108 (90 Base) MCG/ACT inhaler INHALE 2 PUFFS INTO THE LUNGS EVERY 4 HOURS AS NEEDED FOR WHEEZING OR SHORTNESS OF BREATH 64 g 1  . levocetirizine (XYZAL) 5 MG tablet Take 1 tablet (5 mg total) by mouth every evening. 30 tablet 0  . famotidine (PEPCID) 20 MG tablet Take 1 tablet (20 mg total) by mouth 2 (two) times daily for 7 days. 14 tablet 0  . gabapentin (NEURONTIN) 300 MG capsule TAKE ONE CAPSULE BY MOUTH threes times daily (Patient not taking: Reported on 04/27/2021) 90 capsule 5   No facility-administered medications prior to visit.    Allergies  Allergen  Reactions  . Aspirin Anaphylaxis  . Doxycycline Hyclate Swelling    Facial Swelling  . Ibuprofen Anaphylaxis  . Jynarque [Tolvaptan] Anaphylaxis  . Peanut Butter Flavor Anaphylaxis  . Cephalexin Rash    ROS Review of  Systems    Objective:    Physical Exam Constitutional:      General: He is not in acute distress.    Appearance: He is well-developed.  HENT:     Head: Normocephalic and atraumatic.  Cardiovascular:     Rate and Rhythm: Normal rate and regular rhythm.     Heart sounds: No murmur heard.   Pulmonary:     Effort: Pulmonary effort is normal. No respiratory distress.     Breath sounds: Wheezing present. No rales.  Skin:    General: Skin is warm and dry.     Comments: Raised annular lesions noted on lower back, bilateral leg  Neurological:     Mental Status: He is alert and oriented to person, place, and time.  Psychiatric:        Behavior: Behavior normal.        Thought Content: Thought content normal.     BP 120/79 (BP Location: Right Arm, Patient Position: Sitting, Cuff Size: Small)   Pulse (!) 102   Temp 98.2 F (36.8 C) (Oral)   Resp 16   Ht 5\' 6"  (1.676 m)   Wt 218 lb (98.9 kg)   SpO2 97%   BMI 35.19 kg/m  Wt Readings from Last 3 Encounters:  04/27/21 218 lb (98.9 kg)  04/02/21 228 lb 2 oz (103.5 kg)  12/30/20 228 lb (103.4 kg)     There are no preventive care reminders to display for this patient.  There are no preventive care reminders to display for this patient.  Lab Results  Component Value Date   TSH 0.01 (L) 12/04/2020   Lab Results  Component Value Date   WBC 7.4 09/28/2020   HGB 16.2 09/28/2020   HCT 46.5 09/28/2020   MCV 91.0 09/28/2020   PLT 175.0 09/28/2020   Lab Results  Component Value Date   NA 138 09/28/2020   K 4.2 09/28/2020   CO2 30 09/28/2020   GLUCOSE 89 09/28/2020   BUN 19 09/28/2020   CREATININE 1.04 09/28/2020   BILITOT 0.4 09/28/2020   ALKPHOS 76 09/28/2020   AST 15 09/28/2020   ALT 16  09/28/2020   PROT 7.2 09/28/2020   ALBUMIN 4.6 09/28/2020   CALCIUM 9.4 09/28/2020   ANIONGAP 9 01/20/2020   GFR 73.16 09/28/2020   Lab Results  Component Value Date   CHOL 176 01/03/2018   Lab Results  Component Value Date   HDL 56 01/03/2018   Lab Results  Component Value Date   LDLCALC 99 01/03/2018   Lab Results  Component Value Date   TRIG 105 01/03/2018   Lab Results  Component Value Date   CHOLHDL 3.1 01/03/2018   Lab Results  Component Value Date   HGBA1C 5.2 01/03/2018      Assessment & Plan:   Problem List Items Addressed This Visit      Unprioritized   Rash - Primary    Most likely urticaria, however I have seen syphilis rash look similar. Will rx solumedrol 125mg  IM in clinic, to be followed by prednisone 40mg  once daily for 5 days and continue xyzal. Will check RPR to exclude syphilis.       Relevant Orders   RPR   Asthma with acute exacerbation    Will rx with empiric azithromycin. Will also treat with prednisone for rash and asthma. He may require a longer prednisone taper.  I have advised him to reach out to me on day 3-4 if he continues to wheeze  and I will extend his taper.       Relevant Medications   predniSONE (DELTASONE) 20 MG tablet    Other Visit Diagnoses    Urticaria       Relevant Medications   levocetirizine (XYZAL) 5 MG tablet   methylPREDNISolone sodium succinate (SOLU-MEDROL) 125 mg/2 mL injection 125 mg (Completed)      Meds ordered this encounter  Medications  . levocetirizine (XYZAL) 5 MG tablet    Sig: Take 1 tablet (5 mg total) by mouth every evening.    Dispense:  30 tablet    Refill:  5    Order Specific Question:   Supervising Provider    Answer:   Penni Homans A [[id number]]  . predniSONE (DELTASONE) 20 MG tablet    Sig: Take 2 tablets (40 mg total) by mouth daily with breakfast.    Dispense:  10 tablet    Refill:  0    Order Specific Question:   Supervising Provider    Answer:   Penni Homans A [[id number]]  .  azithromycin (ZITHROMAX) 250 MG tablet    Sig: Take 2 tablets on day 1, then 1 tablet daily on days 2 through 5    Dispense:  6 tablet    Refill:  0    Order Specific Question:   Supervising Provider    Answer:   Penni Homans A [[id number]]  . methylPREDNISolone sodium succinate (SOLU-MEDROL) 125 mg/2 mL injection 125 mg    Follow-up: Return in about 3 months (around 07/28/2021) for follow up visit.    Nance Pear, NP

## 2021-04-28 LAB — RPR: RPR Ser Ql: NONREACTIVE

## 2021-05-03 ENCOUNTER — Ambulatory Visit (HOSPITAL_BASED_OUTPATIENT_CLINIC_OR_DEPARTMENT_OTHER)
Admission: RE | Admit: 2021-05-03 | Discharge: 2021-05-03 | Disposition: A | Discharge status: Home / Self Care | Payer: Medicare HMO | Source: Ambulatory Visit | Attending: Family | Admitting: Family

## 2021-05-03 ENCOUNTER — Other Ambulatory Visit: Payer: Self-pay

## 2021-05-03 ENCOUNTER — Ambulatory Visit (INDEPENDENT_AMBULATORY_CARE_PROVIDER_SITE_OTHER): Payer: Medicare HMO | Admitting: Family

## 2021-05-03 ENCOUNTER — Telehealth: Payer: Self-pay | Admitting: Family

## 2021-05-03 ENCOUNTER — Other Ambulatory Visit (HOSPITAL_BASED_OUTPATIENT_CLINIC_OR_DEPARTMENT_OTHER): Payer: Self-pay

## 2021-05-03 ENCOUNTER — Other Ambulatory Visit: Payer: Self-pay | Admitting: Family

## 2021-05-03 VITALS — BP 116/90 | HR 103 | Temp 98.2°F | Resp 16

## 2021-05-03 DIAGNOSIS — R059 Cough, unspecified: Secondary | ICD-10-CM | POA: Diagnosis not present

## 2021-05-03 DIAGNOSIS — J45909 Unspecified asthma, uncomplicated: Secondary | ICD-10-CM | POA: Diagnosis not present

## 2021-05-03 DIAGNOSIS — J4541 Moderate persistent asthma with (acute) exacerbation: Secondary | ICD-10-CM

## 2021-05-03 DIAGNOSIS — R21 Rash and other nonspecific skin eruption: Secondary | ICD-10-CM

## 2021-05-03 DIAGNOSIS — J9811 Atelectasis: Secondary | ICD-10-CM | POA: Diagnosis not present

## 2021-05-03 MED ORDER — BENZONATATE 100 MG PO CAPS: 100.0000 mg | ORAL_CAPSULE | Freq: Two times a day (BID) | ORAL | 0 refills | Status: DC | PRN

## 2021-05-03 MED ORDER — PREDNISONE 10 MG PO TABS
ORAL_TABLET | ORAL | 0 refills | Status: DC
  Filled 2021-05-03: qty 20, 8d supply, fill #0

## 2021-05-03 MED ORDER — VENTOLIN HFA 108 (90 BASE) MCG/ACT IN AERS: INHALATION_SPRAY | RESPIRATORY_TRACT | 1 refills | Status: DC

## 2021-05-03 MED ORDER — FLUTICASONE-SALMETEROL 500-50 MCG/ACT IN AEPB: 1.0000 | INHALATION_SPRAY | Freq: Two times a day (BID) | RESPIRATORY_TRACT | 11 refills | Status: DC

## 2021-05-03 NOTE — Progress Notes (Signed)
Subjective:   By signing my name below, I, Shehryar Baig, attest that this documentation has been prepared under the direction and in the presence of [name] NP. 05/03/2021     Patient ID: Kyle Blair, male    DOB: 01-11-1961, 60 y.o.   MRN: [id number]  No chief complaint on file.   HPI Patient is in today for a office visit. We last saw him on 04/27/21 for urticarial rash and he was completed a steroid course. He reports that rash is significantly improve.   He was treated for acute asthma exacerbation on 5/3 with continuation of his steroids and a zpak. He reports that he had some improvement in his breathing with prednisone, but as soon as he completed the course, he had increased tightness/wheezing in his lungs. He also has cold symptoms.  He is requesting for a refill on his 2.5mg /77ml albuterol. He is also requesting a 500-50 mcg refill of Advair 2x daily PO. He is requesting tessalon perles to manage his cough. He reports only sleeping for 3 hours last night.   Past Medical History:  Diagnosis Date  . Arthritis    severe; pt takes strong pain meds daily/legs  . Asthma    MONTH AGO  . BPH (benign prostatic hypertrophy)   . Diverticulosis   . GERD with stricture   . Hiatal hernia   . History of stroke 2004  . Hyperlipidemia   . Hypertension   . Hypothyroid   . Lung nodule 02/03/2012  . Osteoarthritis of left knee   . Polycystic kidney disease   . Stroke ([place])    2004   EQULIBRIUM, AND JUDGING DISTANCE  . Tubular adenoma of colon     Past Surgical History:  Procedure Laterality Date  . CARPAL TUNNEL RELEASE    . COLONOSCOPY    . HERNIA REPAIR  2005   with mesh  . KNEE ARTHROSCOPY Bilateral 12/05/13   cyst and bone spur removed from left knee per pt.  Marland Kitchen KNEE ARTHROSCOPY Left 01/2015  . NASAL POLYP EXCISION  1992 or 93  . POLYPECTOMY    . TOTAL KNEE ARTHROPLASTY Right 05/19/2015   Procedure: RIGHT TOTAL KNEE ARTHROPLASTY;  Surgeon: Paralee Cancel, MD;   Location: [facility];  Service: Orthopedics;  Laterality: Right;  . TOTAL KNEE ARTHROPLASTY Left 11/19/2018   Procedure: LEFT  TOTAL KNEE ARTHROPLASTY;  Surgeon: Dorna Leitz, MD;  Location: [place];  Service: Orthopedics;  Laterality: Left;    Family History  Problem Relation Age of Onset  . Heart disease Mother        pacemaker  . Diabetes Father   . Kidney disease Father   . Diabetes Sister   . Kidney disease Sister   . Colon cancer Neg Hx   . Esophageal cancer Neg Hx   . Rectal cancer Neg Hx   . Stomach cancer Neg Hx   . Prostate cancer Neg Hx   . Pancreatic cancer Neg Hx     Social History   Socioeconomic History  . Marital status: Married    Spouse name: Not on file  . Number of children: 2  . Years of education: Not on file  . Highest education level: Not on file  Occupational History  . Occupation: DISABLED    Employer: UNEMPLOYED  Tobacco Use  . Smoking status: Current Every Day Smoker    Packs/day: 0.50    Years: 20.00    Pack years: 10.00    Types: Cigarettes  .  Smokeless tobacco: Never Used  Vaping Use  . Vaping Use: Never used  Substance and Sexual Activity  . Alcohol use: Yes  . Drug use: No  . Sexual activity: Not on file  Other Topics Concern  . Not on file  Social History Narrative   Caffeine use:  1 pot coffee daily, 1 glass tea   Regular exercise:  No. Has active lifestyle.   2 biological and 1 stepson.   Former smoker- quit in 2007   He is on disability- reports that he has a history of heat stroke.              Social Determinants of Health   Financial Resource Strain: Not on file  Food Insecurity: Not on file  Transportation Needs: Not on file  Physical Activity: Not on file  Stress: Not on file  Social Connections: Not on file  Intimate Partner Violence: Not on file    Outpatient Medications Prior to Visit  Medication Sig Dispense Refill  . acetaminophen (TYLENOL) 325 MG tablet Take 1-2 tablets (325-650 mg total) by mouth every 6  (six) hours as needed for mild pain or moderate pain. 30 tablet 0  . ADVAIR DISKUS 500-50 MCG/DOSE AEPB INHALE ONE PUFF into THE lungs TWICE DAILY AS DIRECTED 180 each 1  . albuterol (PROVENTIL) (2.5 MG/3ML) 0.083% nebulizer solution USE 1 VIAL VIA NEBULIZER EVERY 6 HOURS AS NEEDED FOR WHEEZING OR SHORTNESS OF BREATH 225 mL 1  . amLODipine (NORVASC) 10 MG tablet Take 1 tablet (10 mg total) by mouth daily. 90 tablet 1  . ATROVENT HFA 17 MCG/ACT inhaler INHALE TWO PUFFS into THE lungs EVERY 6 HOURS AS NEEDED FOR wheezing 38.7 g 1  . benazepril (LOTENSIN) 20 MG tablet TAKE ONE TABLET BY MOUTH EVERY MORNING 90 tablet 1  . cyclobenzaprine (FLEXERIL) 5 MG tablet TAKE ONE TABLET BY MOUTH twice A DAY AS NEEDED FOR MUSCLE SPASMS 180 tablet 1  . diphenoxylate-atropine (LOMOTIL) 2.5-0.025 MG tablet Take 1 tablet by mouth 4 (four) times daily as needed for diarrhea or loose stools. 60 tablet 1  . famotidine (PEPCID) 20 MG tablet Take 1 tablet (20 mg total) by mouth 2 (two) times daily for 7 days. 14 tablet 0  . fluticasone (FLOVENT HFA) 110 MCG/ACT inhaler INHALE 2 PUFFS INTO THE LUNGS DAILY WHILE IN YOUR YELLOW ZONE. RINSE MOUTH AND SPIT OUT AFTER USE. 12 g 5  . furosemide (LASIX) 20 MG tablet Take 1 tablet (20 mg total) by mouth daily. 90 tablet 1  . gabapentin (NEURONTIN) 300 MG capsule TAKE ONE CAPSULE BY MOUTH threes times daily (Patient not taking: Reported on 04/27/2021) 90 capsule 5  . levocetirizine (XYZAL) 5 MG tablet Take 1 tablet (5 mg total) by mouth every evening. 30 tablet 5  . levothyroxine (SYNTHROID) 200 MCG tablet TAKE ONE TABLET BY MOUTH BEFORE BREAKFAST 90 tablet 1  . lidocaine (XYLOCAINE) 5 % ointment Apply 1 application topically 3 (three) times daily as needed. 35.44 g 0  . lovastatin (MEVACOR) 40 MG tablet TAKE ONE TABLET BY MOUTH EVERYDAY AT BEDTIME 90 tablet 1  . montelukast (SINGULAIR) 10 MG tablet TAKE ONE TABLET BY MOUTH EVERYDAY AT BEDTIME 90 tablet 1  . pantoprazole (PROTONIX) 40  MG tablet Take 1 tablet (40 mg total) by mouth daily. 90 tablet 3  . predniSONE (DELTASONE) 20 MG tablet Take 2 tablets (40 mg total) by mouth daily with breakfast. 10 tablet 0  . rOPINIRole (REQUIP) 0.5 MG tablet TAKE ONE  TABLET BY MOUTH EVERYDAY AT BEDTIME 90 tablet 1  . sildenafil (REVATIO) 20 MG tablet TAKE 1 TO 2 TABLETS BY MOUTH 30 MINUTES prior TO sexual activity 75 tablet 1  . tamsulosin (FLOMAX) 0.4 MG CAPS capsule TAKE ONE CAPSULE BY MOUTH DAILY 90 capsule 1  . VENTOLIN HFA 108 (90 Base) MCG/ACT inhaler INHALE 2 PUFFS INTO THE LUNGS EVERY 4 HOURS AS NEEDED FOR WHEEZING OR SHORTNESS OF BREATH 64 g 1   No facility-administered medications prior to visit.    Allergies  Allergen Reactions  . Aspirin Anaphylaxis  . Doxycycline Hyclate Swelling    Facial Swelling  . Ibuprofen Anaphylaxis  . Jynarque [Tolvaptan] Anaphylaxis  . Peanut Butter Flavor Anaphylaxis  . Cephalexin Rash    Review of Systems  HENT: Positive for congestion and sore throat.        Nasal drainage.  Respiratory: Positive for cough.   Skin: Positive for rash (Right lower extremities and lower back.).       Objective:    Physical Exam Constitutional:      Appearance: Normal appearance.  HENT:     Head: Normocephalic and atraumatic.     Right Ear: External ear normal.     Left Ear: External ear normal.  Eyes:     Extraocular Movements: Extraocular movements intact.     Pupils: Pupils are equal, round, and reactive to light.  Cardiovascular:     Rate and Rhythm: Normal rate and regular rhythm.     Pulses: Normal pulses.     Heart sounds: Normal heart sounds.  Pulmonary:     Effort: Pulmonary effort is normal. No respiratory distress.     Breath sounds: No stridor. Wheezing (bilateral) present. No rhonchi or rales.  Skin:    General: Skin is warm and dry.     Comments: Rash is improved on lower back and lower extremities- some discoloration is all that remains.   Neurological:     Mental Status:  He is alert and oriented to person, place, and time.  Psychiatric:        Behavior: Behavior normal.     There were no vitals taken for this visit. Wt Readings from Last 3 Encounters:  04/27/21 218 lb (98.9 kg)  04/02/21 228 lb 2 oz (103.5 kg)  12/30/20 228 lb (103.4 kg)    Diabetic Foot Exam - Simple   No data filed    Lab Results  Component Value Date   WBC 7.4 09/28/2020   HGB 16.2 09/28/2020   HCT 46.5 09/28/2020   PLT 175.0 09/28/2020   GLUCOSE 89 09/28/2020   CHOL 176 01/03/2018   TRIG 105 01/03/2018   HDL 56 01/03/2018   LDLDIRECT 97.0 10/11/2016   LDLCALC 99 01/03/2018   ALT 16 09/28/2020   AST 15 09/28/2020   NA 138 09/28/2020   K 4.2 09/28/2020   CL 102 09/28/2020   CREATININE 1.04 09/28/2020   BUN 19 09/28/2020   CO2 30 09/28/2020   TSH 0.01 (L) 12/04/2020   PSA 0.13 01/12/2018   INR 0.98 11/12/2018   HGBA1C 5.2 01/03/2018    Lab Results  Component Value Date   TSH 0.01 (L) 12/04/2020   Lab Results  Component Value Date   WBC 7.4 09/28/2020   HGB 16.2 09/28/2020   HCT 46.5 09/28/2020   MCV 91.0 09/28/2020   PLT 175.0 09/28/2020   Lab Results  Component Value Date   NA 138 09/28/2020   K 4.2 09/28/2020  CO2 30 09/28/2020   GLUCOSE 89 09/28/2020   BUN 19 09/28/2020   CREATININE 1.04 09/28/2020   BILITOT 0.4 09/28/2020   ALKPHOS 76 09/28/2020   AST 15 09/28/2020   ALT 16 09/28/2020   PROT 7.2 09/28/2020   ALBUMIN 4.6 09/28/2020   CALCIUM 9.4 09/28/2020   ANIONGAP 9 01/20/2020   GFR 73.16 09/28/2020   Lab Results  Component Value Date   CHOL 176 01/03/2018   Lab Results  Component Value Date   HDL 56 01/03/2018   Lab Results  Component Value Date   LDLCALC 99 01/03/2018   Lab Results  Component Value Date   TRIG 105 01/03/2018   Lab Results  Component Value Date   CHOLHDL 3.1 01/03/2018   Lab Results  Component Value Date   HGBA1C 5.2 01/03/2018       Assessment & Plan:   Problem List Items Addressed This  Visit   None      No orders of the defined types were placed in this encounter.   I, [name] NP, personally preformed the services described in this documentation.  All medical record entries made by the scribe were at my direction and in my presence.  I have reviewed the chart and discharge instructions (if applicable) and agree that the record reflects my personal performance and is accurate and complete. 05/03/2021   I,Shehryar Baig,acting as a scribe for Nance Pear, NP.,have documented all relevant documentation on the behalf of Nance Pear, NP,as directed by  Nance Pear, NP while in the presence of Nance Pear, NP.   Shehryar [employer]

## 2021-05-03 NOTE — Telephone Encounter (Signed)
Patient was scheduled to be her today

## 2021-05-03 NOTE — Patient Instructions (Signed)
Please start prednisone taper. You may use tessalon as needed for cough. Complete chest x-ray on the first floor. Call if symptoms worsen or if not improved in 2-3 day

## 2021-05-03 NOTE — Telephone Encounter (Signed)
Pt would like a stronger dosage of the z pack maybe th e 500 mg he has the 25 mg

## 2021-05-03 NOTE — Telephone Encounter (Signed)
Patient needs an office visit please.  

## 2021-05-03 NOTE — Telephone Encounter (Signed)
Medication: #: [id number]  predniSONE (DELTASONE) 20 MG tablet [[id number]    Has the patient contacted their pharmacy? no  (If no, request that the patient contact the pharmacy for the refill.) (If yes, when and what did the pharmacy advise?)    Preferred Pharmacy (with phone number or street name):  [facility] Phone:  [phone]  Fax:  [phone]         Agent: Please be advised that RX refills may take up to 3 business days. We ask that you follow-up with your pharmacy.

## 2021-05-03 NOTE — Telephone Encounter (Signed)
Patient requesting another rx for prednisone and z pack 500mg . He complains of still having think mucus

## 2021-05-04 ENCOUNTER — Encounter: Payer: Self-pay | Admitting: Family

## 2021-05-04 ENCOUNTER — Telehealth: Payer: Self-pay | Admitting: Family

## 2021-05-04 DIAGNOSIS — E039 Hypothyroidism, unspecified: Secondary | ICD-10-CM

## 2021-05-04 NOTE — Telephone Encounter (Signed)
Please advise pt that he is overdue for follow up TSH. Please schedule lab visit.

## 2021-05-04 NOTE — Assessment & Plan Note (Signed)
Improved. Monitor.  

## 2021-05-04 NOTE — Assessment & Plan Note (Addendum)
Uncontrolled. Will obtain CXR to exclude PNA. Treat with prednisone taper. Continue albuterol MDI or nebulizer every 6 hours for the next few days.

## 2021-05-05 NOTE — Telephone Encounter (Signed)
FYI- you can contact him tomorrow. Tks.

## 2021-05-06 NOTE — Telephone Encounter (Signed)
Patient advised to call office back to schedule lab appointment for TSH

## 2021-05-10 NOTE — Telephone Encounter (Signed)
Lvm again today for patient to call and schedule TSH. Have been calling his home and cell numbers at different times but he is not answering and he has not call back.

## 2021-05-11 NOTE — Telephone Encounter (Signed)
Patient was scheduled to come in for labs on friday

## 2021-05-11 NOTE — Telephone Encounter (Signed)
Called pt, and left Vm to return call to office. -JMA

## 2021-05-14 ENCOUNTER — Other Ambulatory Visit (INDEPENDENT_AMBULATORY_CARE_PROVIDER_SITE_OTHER): Payer: Medicare HMO

## 2021-05-14 ENCOUNTER — Other Ambulatory Visit: Payer: Self-pay

## 2021-05-14 DIAGNOSIS — E039 Hypothyroidism, unspecified: Secondary | ICD-10-CM | POA: Diagnosis not present

## 2021-05-14 LAB — TSH: TSH: 0.47 u[IU]/mL (ref 0.35–4.50)

## 2021-05-17 ENCOUNTER — Encounter: Payer: Self-pay | Admitting: Podiatry

## 2021-05-17 ENCOUNTER — Ambulatory Visit: Payer: Medicare HMO | Admitting: Podiatry

## 2021-05-17 ENCOUNTER — Other Ambulatory Visit: Payer: Self-pay

## 2021-05-17 DIAGNOSIS — M722 Plantar fascial fibromatosis: Secondary | ICD-10-CM | POA: Diagnosis not present

## 2021-05-17 DIAGNOSIS — L600 Ingrowing nail: Secondary | ICD-10-CM | POA: Diagnosis not present

## 2021-05-17 DIAGNOSIS — M79671 Pain in right foot: Secondary | ICD-10-CM

## 2021-05-17 NOTE — Patient Instructions (Signed)
Plantar Fasciitis (Heel Spur Syndrome) with Rehab The plantar fascia is a fibrous, ligament-like, soft-tissue structure that spans the bottom of the foot. Plantar fasciitis is a condition that causes pain in the foot due to inflammation of the tissue. SYMPTOMS   Pain and tenderness on the underneath side of the foot.  Pain that worsens with standing or walking. CAUSES  Plantar fasciitis is caused by irritation and injury to the plantar fascia on the underneath side of the foot. Common mechanisms of injury include:  Direct trauma to bottom of the foot.  Damage to a small nerve that runs under the foot where the main fascia attaches to the heel bone.  Stress placed on the plantar fascia due to bone spurs. RISK INCREASES WITH:   Activities that place stress on the plantar fascia (running, jumping, pivoting, or cutting).  Poor strength and flexibility.  Improperly fitted shoes.  Tight calf muscles.  Flat feet.  Failure to warm-up properly before activity.  Obesity. PREVENTION  Warm up and stretch properly before activity.  Allow for adequate recovery between workouts.  Maintain physical fitness:  Strength, flexibility, and endurance.  Cardiovascular fitness.  Maintain a health body weight.  Avoid stress on the plantar fascia.  Wear properly fitted shoes, including arch supports for individuals who have flat feet.  PROGNOSIS  If treated properly, then the symptoms of plantar fasciitis usually resolve without surgery. However, occasionally surgery is necessary.  RELATED COMPLICATIONS   Recurrent symptoms that may result in a chronic condition.  Problems of the lower back that are caused by compensating for the injury, such as limping.  Pain or weakness of the foot during push-off following surgery.  Chronic inflammation, scarring, and partial or complete fascia tear, occurring more often from repeated injections.  TREATMENT  Treatment initially involves the  use of ice and medication to help reduce pain and inflammation. The use of strengthening and stretching exercises may help reduce pain with activity, especially stretches of the Achilles tendon. These exercises may be performed at home or with a therapist. Your caregiver may recommend that you use heel cups of arch supports to help reduce stress on the plantar fascia. Occasionally, corticosteroid injections are given to reduce inflammation. If symptoms persist for greater than 6 months despite non-surgical (conservative), then surgery may be recommended.   MEDICATION   If pain medication is necessary, then nonsteroidal anti-inflammatory medications, such as aspirin and ibuprofen, or other minor pain relievers, such as acetaminophen, are often recommended.  Do not take pain medication within 7 days before surgery.  Prescription pain relievers may be given if deemed necessary by your caregiver. Use only as directed and only as much as you need.  Corticosteroid injections may be given by your caregiver. These injections should be reserved for the most serious cases, because they may only be given a certain number of times.  HEAT AND COLD  Cold treatment (icing) relieves pain and reduces inflammation. Cold treatment should be applied for 10 to 15 minutes every 2 to 3 hours for inflammation and pain and immediately after any activity that aggravates your symptoms. Use ice packs or massage the area with a piece of ice (ice massage).  Heat treatment may be used prior to performing the stretching and strengthening activities prescribed by your caregiver, physical therapist, or athletic trainer. Use a heat pack or soak the injury in warm water.  SEEK IMMEDIATE MEDICAL CARE IF:  Treatment seems to offer no benefit, or the condition worsens.  Any medications   produce adverse side effects.  EXERCISES- RANGE OF MOTION (ROM) AND STRETCHING EXERCISES - Plantar Fasciitis (Heel Spur Syndrome) These exercises  may help you when beginning to rehabilitate your injury. Your symptoms may resolve with or without further involvement from your physician, physical therapist or athletic trainer. While completing these exercises, remember:   Restoring tissue flexibility helps normal motion to return to the joints. This allows healthier, less painful movement and activity.  An effective stretch should be held for at least 30 seconds.  A stretch should never be painful. You should only feel a gentle lengthening or release in the stretched tissue.  RANGE OF MOTION - Toe Extension, Flexion  Sit with your right / left leg crossed over your opposite knee.  Grasp your toes and gently pull them back toward the top of your foot. You should feel a stretch on the bottom of your toes and/or foot.  Hold this stretch for 10 seconds.  Now, gently pull your toes toward the bottom of your foot. You should feel a stretch on the top of your toes and or foot.  Hold this stretch for 10 seconds. Repeat  times. Complete this stretch 3 times per day.   RANGE OF MOTION - Ankle Dorsiflexion, Active Assisted  Remove shoes and sit on a chair that is preferably not on a carpeted surface.  Place right / left foot under knee. Extend your opposite leg for support.  Keeping your heel down, slide your right / left foot back toward the chair until you feel a stretch at your ankle or calf. If you do not feel a stretch, slide your bottom forward to the edge of the chair, while still keeping your heel down.  Hold this stretch for 10 seconds. Repeat 3 times. Complete this stretch 2 times per day.   STRETCH  Gastroc, Standing  Place hands on wall.  Extend right / left leg, keeping the front knee somewhat bent.  Slightly point your toes inward on your back foot.  Keeping your right / left heel on the floor and your knee straight, shift your weight toward the wall, not allowing your back to arch.  You should feel a gentle stretch  in the right / left calf. Hold this position for 10 seconds. Repeat 3 times. Complete this stretch 2 times per day.  STRETCH  Soleus, Standing  Place hands on wall.  Extend right / left leg, keeping the other knee somewhat bent.  Slightly point your toes inward on your back foot.  Keep your right / left heel on the floor, bend your back knee, and slightly shift your weight over the back leg so that you feel a gentle stretch deep in your back calf.  Hold this position for 10 seconds. Repeat 3 times. Complete this stretch 2 times per day.  STRETCH  Gastrocsoleus, Standing  Note: This exercise can place a lot of stress on your foot and ankle. Please complete this exercise only if specifically instructed by your caregiver.   Place the ball of your right / left foot on a step, keeping your other foot firmly on the same step.  Hold on to the wall or a rail for balance.  Slowly lift your other foot, allowing your body weight to press your heel down over the edge of the step.  You should feel a stretch in your right / left calf.  Hold this position for 10 seconds.  Repeat this exercise with a slight bend in your right /   left knee. Repeat 3 times. Complete this stretch 2 times per day.   STRENGTHENING EXERCISES - Plantar Fasciitis (Heel Spur Syndrome)  These exercises may help you when beginning to rehabilitate your injury. They may resolve your symptoms with or without further involvement from your physician, physical therapist or athletic trainer. While completing these exercises, remember:   Muscles can gain both the endurance and the strength needed for everyday activities through controlled exercises.  Complete these exercises as instructed by your physician, physical therapist or athletic trainer. Progress the resistance and repetitions only as guided.  STRENGTH - Towel Curls  Sit in a chair positioned on a non-carpeted surface.  Place your foot on a towel, keeping your heel  on the floor.  Pull the towel toward your heel by only curling your toes. Keep your heel on the floor. Repeat 3 times. Complete this exercise 2 times per day.  STRENGTH - Ankle Inversion  Secure one end of a rubber exercise band/tubing to a fixed object (table, pole). Loop the other end around your foot just before your toes.  Place your fists between your knees. This will focus your strengthening at your ankle.  Slowly, pull your big toe up and in, making sure the band/tubing is positioned to resist the entire motion.  Hold this position for 10 seconds.  Have your muscles resist the band/tubing as it slowly pulls your foot back to the starting position. Repeat 3 times. Complete this exercises 2 times per day.  Document Released: 12/12/2005 Document Revised: 03/05/2012 Document Reviewed: 03/26/2009 [employer] Patient Information 2014 [employer].    Soak Instructions    THE DAY AFTER THE PROCEDURE  Place 1/4 cup of epsom salts in a quart of warm tap water.  Submerge your foot or feet with outer bandage intact for the initial soak; this will allow the bandage to become moist and wet for easy lift off.  Once you remove your bandage, continue to soak in the solution for 20 minutes.  This soak should be done twice a day.  Next, remove your foot or feet from solution, blot dry the affected area and cover.  You may use a band aid large enough to cover the area or use gauze and tape.  Apply other medications to the area as directed by the doctor such as polysporin neosporin.  IF YOUR SKIN BECOMES IRRITATED WHILE USING THESE INSTRUCTIONS, IT IS OKAY TO SWITCH TO  WHITE VINEGAR AND WATER. Or you may use antibacterial soap and water to keep the toe clean  Monitor for any signs/symptoms of infection. Call the office immediately if any occur or go directly to the emergency room. Call with any questions/concerns.   

## 2021-05-18 ENCOUNTER — Ambulatory Visit: Payer: Medicare HMO | Admitting: Podiatry

## 2021-05-19 NOTE — Progress Notes (Signed)
Subjective: 60 year old male presents the office today for follow evaluation of right heel pain, plan fasciitis.  He states the injections helped quite a bit.  Still getting occasional discomfort but overall much improved.  He has not been doing any stretching exercises.  He also states that his right big toenails be trimmed today.  Certainly tender in the corner femurs removed previously but denies any redness, drainage or any signs of infection. Denies any systemic complaints such as fevers, chills, nausea, vomiting. No acute changes since last appointment, and no other complaints at this time.   Objective: AAO x3, NAD DP/PT pulses palpable bilaterally, CRT less than 3 seconds Mild incurvation of the distal lateral right hallux toenail.  No edema, erythema or signs of infection.  There is minimal discomfort on the plantar medial tubercle of the calcaneus at insertion plantar fascial right side.  Plantar fascial appears to be intact.  There is no pain with lateral compression of calcaneus.  No edema, erythema.  MMT 5/5.  No Tinel sign. No pain with calf compression, swelling, warmth, erythema  Assessment: 60 year old male with improving right heel pain, plantar fasciitis; right hallux ingrown toenail without infection  Plan: -All treatment options discussed with the patient including all alternatives, risks, complications.  -Discussed with him repeat partial nail avulsion he wished to hold off on this today it is going to vacation.  A sharp debrided the nail without any complications or bleeding.  Epson salt soaks.  If any signs or symptoms of infection about the nail.  Will possibly plan on partial nail avulsion gets back. -Overall the right heel is doing much better.  We held off another injection today.  I discussed some stretching, icing exercises daily as well as shoes and good arch support. -Patient encouraged to call the office with any questions, concerns, change in symptoms.   Trula Slade DPM

## 2021-06-17 ENCOUNTER — Other Ambulatory Visit: Payer: Self-pay

## 2021-06-17 ENCOUNTER — Ambulatory Visit: Payer: Medicare HMO | Admitting: Podiatry

## 2021-06-17 DIAGNOSIS — M7751 Other enthesopathy of right foot: Secondary | ICD-10-CM

## 2021-06-17 DIAGNOSIS — L600 Ingrowing nail: Secondary | ICD-10-CM | POA: Diagnosis not present

## 2021-06-17 DIAGNOSIS — B351 Tinea unguium: Secondary | ICD-10-CM | POA: Diagnosis not present

## 2021-06-17 DIAGNOSIS — M722 Plantar fascial fibromatosis: Secondary | ICD-10-CM | POA: Diagnosis not present

## 2021-06-17 MED ORDER — TRIAMCINOLONE ACETONIDE 10 MG/ML IJ SUSP
10.0000 mg | Freq: Once | INTRAMUSCULAR | Status: AC
Start: 2021-06-17 — End: 2021-06-17
  Administered 2021-06-17: 10 mg

## 2021-06-17 NOTE — Patient Instructions (Signed)
I have ordered a medication for you that will come from [facility] in [place]. They should be calling you to verify insurance and will mail the medication to you. If you live close by then you can go by their pharmacy to pick up the medication. Their phone number is [phone]. If you do not hear from them in the next few days, please give us a call at [phone].   Plantar Fasciitis (Heel Spur Syndrome) with Rehab The plantar fascia is a fibrous, ligament-like, soft-tissue structure that spans the bottom of the foot. Plantar fasciitis is a condition that causes pain in the foot due to inflammation of the tissue. SYMPTOMS  Pain and tenderness on the underneath side of the foot. Pain that worsens with standing or walking. CAUSES  Plantar fasciitis is caused by irritation and injury to the plantar fascia on the underneath side of the foot. Common mechanisms of injury include: Direct trauma to bottom of the foot. Damage to a small nerve that runs under the foot where the main fascia attaches to the heel bone. Stress placed on the plantar fascia due to bone spurs. RISK INCREASES WITH:  Activities that place stress on the plantar fascia (running, jumping, pivoting, or cutting). Poor strength and flexibility. Improperly fitted shoes. Tight calf muscles. Flat feet. Failure to warm-up properly before activity. Obesity. PREVENTION Warm up and stretch properly before activity. Allow for adequate recovery between workouts. Maintain physical fitness: Strength, flexibility, and endurance. Cardiovascular fitness. Maintain a health body weight. Avoid stress on the plantar fascia. Wear properly fitted shoes, including arch supports for individuals who have flat feet.  PROGNOSIS  If treated properly, then the symptoms of plantar fasciitis usually resolve without surgery. However, occasionally surgery is necessary.  RELATED COMPLICATIONS  Recurrent symptoms that may result in a  chronic condition. Problems of the lower back that are caused by compensating for the injury, such as limping. Pain or weakness of the foot during push-off following surgery. Chronic inflammation, scarring, and partial or complete fascia tear, occurring more often from repeated injections.  TREATMENT  Treatment initially involves the use of ice and medication to help reduce pain and inflammation. The use of strengthening and stretching exercises may help reduce pain with activity, especially stretches of the Achilles tendon. These exercises may be performed at home or with a therapist. Your caregiver may recommend that you use heel cups of arch supports to help reduce stress on the plantar fascia. Occasionally, corticosteroid injections are given to reduce inflammation. If symptoms persist for greater than 6 months despite non-surgical (conservative), then surgery may be recommended.   MEDICATION  If pain medication is necessary, then nonsteroidal anti-inflammatory medications, such as aspirin and ibuprofen, or other minor pain relievers, such as acetaminophen, are often recommended. Do not take pain medication within 7 days before surgery. Prescription pain relievers may be given if deemed necessary by your caregiver. Use only as directed and only as much as you need. Corticosteroid injections may be given by your caregiver. These injections should be reserved for the most serious cases, because they may only be given a certain number of times.  HEAT AND COLD Cold treatment (icing) relieves pain and reduces inflammation. Cold treatment should be applied for 10 to 15 minutes every 2 to 3 hours for inflammation and pain and immediately after any activity that aggravates your symptoms. Use ice packs or massage the area with a piece of ice (ice massage). Heat treatment may be used prior to performing the stretching   and strengthening activities prescribed by your caregiver, physical therapist, or  athletic trainer. Use a heat pack or soak the injury in warm water.  SEEK IMMEDIATE MEDICAL CARE IF: Treatment seems to offer no benefit, or the condition worsens. Any medications produce adverse side effects.  EXERCISES- RANGE OF MOTION (ROM) AND STRETCHING EXERCISES - Plantar Fasciitis (Heel Spur Syndrome) These exercises may help you when beginning to rehabilitate your injury. Your symptoms may resolve with or without further involvement from your physician, physical therapist or athletic trainer. While completing these exercises, remember:  Restoring tissue flexibility helps normal motion to return to the joints. This allows healthier, less painful movement and activity. An effective stretch should be held for at least 30 seconds. A stretch should never be painful. You should only feel a gentle lengthening or release in the stretched tissue.  RANGE OF MOTION - Toe Extension, Flexion Sit with your right / left leg crossed over your opposite knee. Grasp your toes and gently pull them back toward the top of your foot. You should feel a stretch on the bottom of your toes and/or foot. Hold this stretch for 10 seconds. Now, gently pull your toes toward the bottom of your foot. You should feel a stretch on the top of your toes and or foot. Hold this stretch for 10 seconds. Repeat  times. Complete this stretch 3 times per day.   RANGE OF MOTION - Ankle Dorsiflexion, Active Assisted Remove shoes and sit on a chair that is preferably not on a carpeted surface. Place right / left foot under knee. Extend your opposite leg for support. Keeping your heel down, slide your right / left foot back toward the chair until you feel a stretch at your ankle or calf. If you do not feel a stretch, slide your bottom forward to the edge of the chair, while still keeping your heel down. Hold this stretch for 10 seconds. Repeat 3 times. Complete this stretch 2 times per day.   STRETCH  Gastroc, Standing Place  hands on wall. Extend right / left leg, keeping the front knee somewhat bent. Slightly point your toes inward on your back foot. Keeping your right / left heel on the floor and your knee straight, shift your weight toward the wall, not allowing your back to arch. You should feel a gentle stretch in the right / left calf. Hold this position for 10 seconds. Repeat 3 times. Complete this stretch 2 times per day.  STRETCH  Soleus, Standing Place hands on wall. Extend right / left leg, keeping the other knee somewhat bent. Slightly point your toes inward on your back foot. Keep your right / left heel on the floor, bend your back knee, and slightly shift your weight over the back leg so that you feel a gentle stretch deep in your back calf. Hold this position for 10 seconds. Repeat 3 times. Complete this stretch 2 times per day.  STRETCH  Gastrocsoleus, Standing  Note: This exercise can place a lot of stress on your foot and ankle. Please complete this exercise only if specifically instructed by your caregiver.  Place the ball of your right / left foot on a step, keeping your other foot firmly on the same step. Hold on to the wall or a rail for balance. Slowly lift your other foot, allowing your body weight to press your heel down over the edge of the step. You should feel a stretch in your right / left calf. Hold this position for   10 seconds. Repeat this exercise with a slight bend in your right / left knee. Repeat 3 times. Complete this stretch 2 times per day.   STRENGTHENING EXERCISES - Plantar Fasciitis (Heel Spur Syndrome)  These exercises may help you when beginning to rehabilitate your injury. They may resolve your symptoms with or without further involvement from your physician, physical therapist or athletic trainer. While completing these exercises, remember:  Muscles can gain both the endurance and the strength needed for everyday activities through controlled exercises. Complete  these exercises as instructed by your physician, physical therapist or athletic trainer. Progress the resistance and repetitions only as guided.  STRENGTH - Towel Curls Sit in a chair positioned on a non-carpeted surface. Place your foot on a towel, keeping your heel on the floor. Pull the towel toward your heel by only curling your toes. Keep your heel on the floor. Repeat 3 times. Complete this exercise 2 times per day.  STRENGTH - Ankle Inversion Secure one end of a rubber exercise band/tubing to a fixed object (table, pole). Loop the other end around your foot just before your toes. Place your fists between your knees. This will focus your strengthening at your ankle. Slowly, pull your big toe up and in, making sure the band/tubing is positioned to resist the entire motion. Hold this position for 10 seconds. Have your muscles resist the band/tubing as it slowly pulls your foot back to the starting position. Repeat 3 times. Complete this exercises 2 times per day.  Document Released: 12/12/2005 Document Revised: 03/05/2012 Document Reviewed: 03/26/2009 [employer] Patient Information 2014 [employer].  

## 2021-06-24 NOTE — Progress Notes (Signed)
Subjective: 60 year old male presents the office today for follow-up evaluation of heel pain complaint that she has in the right side as well as for toenail fungus, ingrown nail.  Since ingrowing toenails doing well without any pain.  He does note some discoloration to the nail itself but no pain.  No redness or drainage he reports.  He states he has had a flareup of the plantar fasciitis on the right side and is requesting a heel injection today.  He states the modification did a lot of walking and the seem to aggravate it.  No recent injury or falls. Denies any systemic complaints such as fevers, chills, nausea, vomiting. No acute changes since last appointment, and no other complaints at this time.   Objective: AAO x3, NAD DP/PT pulses palpable bilaterally, CRT less than 3 seconds On the right hallux toenail mild incurvation but there is no edema, erythema or signs of infection to the nail is mildly hypertrophic, dystrophic with brown discoloration.  No signs of infection of the toenail.  There is tenderness palpation of the plantar medial tubercle of the calcaneus at insertion of plantar fashion on the right side.  Plantar fascial region intact.  No pain about the left calcaneus.  Mild discomfort on the peroneal and flexor tendons as well but overall tends to be intact without any edema to this area as well.  MMT/5. No pain with calf compression, swelling, warmth, erythema  Assessment: Plantar fasciitis/tendinitis right side; onychomycosis  Plan: -All treatment options discussed with the patient including all alternatives, risks, complications.  -Steroid injection performed on the right heel today.  Skin was prepped with alcohol and mixture 1 cc, 10, 1 cc lidocaine plain was infiltrated on the plantar medial tubercle of the calcaneus and insertion of plantar fascia.  Tolerated procedure well.  Postinjection care discussed. -Tri-Lock ankle brace dispensed -Continue stretching, icing daily as well  as wearing shoes and good arch supports. -We will hold off on partial nail avulsion today as the pain is improved no signs of infection.  Order compound cream today through [facility] for nail fungus.   -Patient encouraged to call the office with any questions, concerns, change in symptoms.   Trula Slade DPM

## 2021-07-22 ENCOUNTER — Other Ambulatory Visit: Payer: Self-pay | Admitting: Family

## 2021-07-23 ENCOUNTER — Other Ambulatory Visit: Payer: Self-pay | Admitting: Family

## 2021-07-23 DIAGNOSIS — K219 Gastro-esophageal reflux disease without esophagitis: Secondary | ICD-10-CM

## 2021-08-04 ENCOUNTER — Other Ambulatory Visit: Payer: Self-pay | Admitting: Internal Medicine

## 2021-08-17 ENCOUNTER — Ambulatory Visit: Payer: Medicare HMO | Admitting: Podiatry

## 2021-08-17 ENCOUNTER — Other Ambulatory Visit: Payer: Self-pay

## 2021-08-17 ENCOUNTER — Encounter: Payer: Self-pay | Admitting: Podiatry

## 2021-08-17 DIAGNOSIS — M722 Plantar fascial fibromatosis: Secondary | ICD-10-CM

## 2021-08-17 MED ORDER — CICLOPIROX 8 % EX SOLN: Freq: Every day | CUTANEOUS | 2 refills | Status: DC

## 2021-08-17 MED ORDER — METHYLPREDNISOLONE 4 MG PO TBPK: ORAL_TABLET | ORAL | 0 refills | Status: DC

## 2021-08-17 NOTE — Patient Instructions (Signed)
Walking Boot, Adult  A walking boot holds your foot or ankle in place after an injury or a medical procedure. This helps with healing and prevents further injury. It has a hard, rigid outer frame that limits movement and supports your foot and leg. The inner lining is a layer of padded material. Walking boots also have adjustablestraps to secure them over the foot and leg. A walking boot may be prescribed if you can put weight (bear weight) on your injured foot. How much you can walk while wearing the boot willdepend on the type and severity of your injury. How to put on a walking boot There are different types of walking boots. Each type has specific instructions about how to wear it properly. Follow instructions from your health care provider, such as: Ask someone to help you put on the boot, if needed. Sit to put on your boot. Doing this is more comfortable and helps to prevent falls. Open up the boot fully. Place your foot into the boot so your heel rests against the back. Your toes should be supported by the base of the boot. They should not hang over the front edge. Adjust the straps so the boot fits securely but is not too tight. Do not bend the hard frame of the boot to get a good fit. How to walk with a walking boot How much you can walk will depend on your injury. Some tips for managing with a boot include: Do not try to walk without wearing the boot unless your health care provider approves. Use other assistive walking devices, including crutches or canes, as told by your health care provider. On your uninjured foot, wear a shoe with a heel that is close to the height of the walking boot. Be careful when walking on surfaces that are uneven or wet. How to reduce swelling while using a walking boot  Rest your injured foot or leg as much as possible. If directed, put ice on the injured area. To do this: Put ice in a plastic bag. Place a towel between your skin and the bag. Leave the  ice on for 20 minutes, 2-3 times a day. Remove the ice if your skin turns bright red. This is very important. If you cannot feel pain, heat, or cold, you have a greater risk of damage to the area. Keep your injured foot or leg raised (elevated) above the level of your heart for at least 2?3 hours each day or as told by your health care provider. If swelling gets worse, loosen the boot. Rest and elevate your foot and leg. How to care for your skin and foot while using a walking boot Wear a long sock to protect your foot and leg from rubbing inside the boot. Take off the boot one time each day to check the injured area. Check your foot, the surrounding skin, and your leg to make sure there are no sores, rashes, swelling, or wounds. The skin should be a healthy color, not pale or blue. Try to notice if your walking pattern (gait) in the boot is fairly normal and that you are walking without a noticeable limp. Follow instructions from your health care provider about taking care of your incision or wound, if this applies. Clean and wash the injured area as told by your health care provider. Gently dry your foot and leg before putting the boot back on. Removing your walking boot Follow directions from your health care provider for removing the walking boot.   Generally, it is okay to remove your walking boot: When you are resting or sleeping. To clean your foot and leg. How to keep the walking boot clean Do not put any part of the boot in a washing machine or dryer. Do not use chemical cleaning products. These could irritate your skin, especially if you have a wound or an incision. Do not soak the liner of the boot. Use a washcloth with mild soap and water to clean the frame and the liner of the boot by hand. Allow the boot to air-dry completely before you put it back on your foot. Follow these instructions at home: Activity Your activity will be restricted depending on the type and severity of your  injury. Follow instructions from your health care provider. Also: Bathe and shower as told by your health care provider. Do not do any activities that could make your injury worse. Do not drive if your affected foot is the one that you use for driving. Contact a health care provider if: The boot is cracked or damaged. The boot does not fit properly. Your foot or leg hurts. You have a rash, sore, or open sore (ulcer) on your foot or leg. The skin on your foot or leg is pale. You have a wound or incision on the foot and it is getting worse. Your skin becomes painful, red, or irritated. Your swelling does not get better or it gets worse. Get help right away if: You have numbness in your foot or leg. The skin on your foot or leg is cold, blue, or gray. Summary A walking boot holds your foot or ankle in place after an injury or a medical procedure. There are different types of walking boots. Follow instructions about how to correctly wear your boot. Ask someone to help you put on the boot, if needed. It is important to check your skin and foot every day. Call your health care provider if you notice a rash or sore on your foot or leg. This information is not intended to replace advice given to you by your health care provider. Make sure you discuss any questions you have with your healthcare provider. Document Revised: 10/05/2020 Document Reviewed: 10/05/2020 Elsevier Patient Education  2022 [employer] Inc.  

## 2021-08-23 NOTE — Progress Notes (Signed)
Subjective: 60 year old male presents the office today for follow-up evaluation of heel pain as well as for toenail fungus.  He states that he is continue get discomfort on the right foot.  Injection helped for only a short time.  Overall the pain is about the same if not somewhat worse compared to what it was previously.  No recent injury or falls or changes otherwise.  Also he did not get the toenail fungus medication.  Objective: AAO x3, NAD DP/PT pulses palpable bilaterally, CRT less than 3 seconds On the right hallux toenail mild incurvation but there is no edema, erythema or signs of infection to the nail is mildly hypertrophic, dystrophic with brown discoloration.  No signs of infection of the toenail.  Overall unchanged. There is continued tenderness to palpation of the plantar medial tubercle of the calcaneus at insertion of plantar fashion on the right side.  Plantar fascial appears to be intact.  No pain with lateral compression of the calcaneus.  Mild discomfort on the peroneal and flexor tendons as well but overall tends to be intact without any edema to this area as well.  MMT/5. No pain with calf compression, swelling, warmth, erythema  Assessment: Plantar fasciitis/tendinitis right side; onychomycosis  Plan: -All treatment options discussed with the patient including all alternatives, risks, complications.  -Necessary injections not been overly helpful prescribed a Medrol Dosepak for him.  Encouraged continued stretching, icing daily.  Discussed shoes and good arch supports.  I also ordered an MRI of the right ankle. -Penlac ordered for nail fungus -Patient encouraged to call the office with any questions, concerns, change in symptoms.   Trula Slade DPM

## 2021-08-26 ENCOUNTER — Telehealth: Payer: Self-pay | Admitting: *Deleted

## 2021-08-26 NOTE — Telephone Encounter (Signed)
-----   Message from Trula Slade, DPM sent at 08/23/2021  5:52 PM EDT ----- I ordered an MRI of the right ankle last appointment.  Can you please follow-up on this?  Thank you.

## 2021-08-26 NOTE — Telephone Encounter (Signed)
Patient is scheduled for [facility] on 09-01-2021. Kyle Blair

## 2021-09-01 ENCOUNTER — Other Ambulatory Visit: Payer: Self-pay

## 2021-09-01 ENCOUNTER — Ambulatory Visit
Admission: RE | Admit: 2021-09-01 | Discharge: 2021-09-01 | Disposition: A | Discharge status: Home / Self Care | Payer: Medicare HMO | Source: Ambulatory Visit | Attending: Podiatry | Admitting: Podiatry

## 2021-09-01 DIAGNOSIS — M722 Plantar fascial fibromatosis: Secondary | ICD-10-CM

## 2021-09-01 DIAGNOSIS — M25471 Effusion, right ankle: Secondary | ICD-10-CM | POA: Diagnosis not present

## 2021-09-01 DIAGNOSIS — M19071 Primary osteoarthritis, right ankle and foot: Secondary | ICD-10-CM | POA: Diagnosis not present

## 2021-09-03 ENCOUNTER — Telehealth: Payer: Self-pay | Admitting: Family

## 2021-09-03 DIAGNOSIS — Z7689 Persons encountering health services in other specified circumstances: Secondary | ICD-10-CM | POA: Diagnosis not present

## 2021-09-03 DIAGNOSIS — Q612 Polycystic kidney, adult type: Secondary | ICD-10-CM | POA: Diagnosis not present

## 2021-09-03 DIAGNOSIS — N181 Chronic kidney disease, stage 1: Secondary | ICD-10-CM | POA: Diagnosis not present

## 2021-09-03 DIAGNOSIS — I10 Essential (primary) hypertension: Secondary | ICD-10-CM | POA: Diagnosis not present

## 2021-09-03 DIAGNOSIS — Z720 Tobacco use: Secondary | ICD-10-CM | POA: Diagnosis not present

## 2021-09-03 DIAGNOSIS — Z759 Unspecified problem related to medical facilities and other health care: Secondary | ICD-10-CM | POA: Diagnosis not present

## 2021-09-03 NOTE — Telephone Encounter (Signed)
Left message for patient to call back and schedule Medicare Annual Wellness Visit (AWV) in office.   If not able to come in office, please offer to do virtually or by telephone.  Left office number and my jabber [phone].  Last AWV:12/02/2016  Please schedule at anytime with Nurse Health Advisor.

## 2021-09-06 ENCOUNTER — Telehealth: Payer: Self-pay | Admitting: Podiatry

## 2021-09-06 ENCOUNTER — Other Ambulatory Visit: Payer: Self-pay | Admitting: Podiatry

## 2021-09-06 DIAGNOSIS — M722 Plantar fascial fibromatosis: Secondary | ICD-10-CM

## 2021-09-06 MED ORDER — METHYLPREDNISOLONE 4 MG PO TBPK: ORAL_TABLET | ORAL | 0 refills | Status: DC

## 2021-09-06 NOTE — Telephone Encounter (Signed)
Patient wife called and stated Mr. Kyle Blair would like a call about his MRI results at [phone] or  [phone]

## 2021-09-09 NOTE — Telephone Encounter (Signed)
Faxed over the PT referral yesterday to [facility] and the phone number is [phone] and the fax number is [phone]. Kyle Blair

## 2021-09-13 ENCOUNTER — Other Ambulatory Visit: Payer: Self-pay | Admitting: Family

## 2021-09-30 ENCOUNTER — Encounter: Payer: Self-pay | Admitting: Physician Assistant

## 2021-09-30 ENCOUNTER — Ambulatory Visit: Payer: Medicare HMO | Admitting: Physician Assistant

## 2021-09-30 VITALS — BP 120/70 | HR 90 | Ht 67.0 in | Wt 212.0 lb

## 2021-09-30 DIAGNOSIS — Q613 Polycystic kidney, unspecified: Secondary | ICD-10-CM

## 2021-09-30 DIAGNOSIS — R1319 Other dysphagia: Secondary | ICD-10-CM | POA: Diagnosis not present

## 2021-09-30 NOTE — Progress Notes (Signed)
Chief Complaint: Discuss EGD  HPI:    Kyle Blair is a 60 year old male with a past medical history as listed below including stroke in 2004, arthritis, polycystic kidney disease, known to Dr. Hilarie Fredrickson, who was referred to me by [name], NP for discussion of an EGD.    09/28/2020 patient seen in clinic by Carl Best for diarrhea.  At that time had repeat C. difficile, pancreatic elastase, fecal leukocyte and lactoferrin level and continued on Lomotil 1 4 times daily.  Labs are normal.  It does not look like he ever had repeat stool studies    Today, the patient tells me that over the past couple of months he has had a few severe choking moments where he gets food stuck in his throat and has to stick his finger down the back in order to get it up.  He has also noticed that when eating rice this seems to get stuck and he gets a lot of saliva formation and spits copious amounts of it.  Tells me that he has had trouble with this his whole life but typically after having an endoscopy with empiric dilation he does better for a long time.  Currently taking his Pantoprazole 40 mg once daily with only minimal breakthrough symptoms.    Reminds me today of his history of polycystic kidney disease and tells me he gets quite depressed when thinking about this and the fact there is no way to help him.    He is a Probation officer.  He used to ride more in the past when he didn't have arthritis.    Denies fever, chills, weight loss, blood in his stool, change in bowel habits or abdominal pain.  Previous GI work-up: Abdominal sonogram 10/23/2019: 1. Heterogeneous liver echotexture slightly increased suggesting steatosis. There are cysts in the liver. 2. Kidneys show no hydronephrosis. Cysts and complex cysts within the bilateral kidneys, the dominant measured lesions on the right are stable to decreased in size compared to prior sonography. The dominant lesions on the left are stable  to slightly increased in size but remain compatible with cysts. Lesions were previously evaluated with MRI.   Abdominal MRI 03/24/2018: 1. Mild to moderate motion degradation throughout. 2. 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 No suspicious liver lesion. 3. Multiple renal lesions which are favored to represent cysts and complex cysts. No highly suspicious or dominant lesion identified.     EGD 10/26/2017: - There is no endoscopic evidence of Barrett's esophagus. - Small hiatal hernia. - No endoscopic esophageal abnormality seen today to explain patient's dysphagia. Esophagus dilated to 18 mm with balloon. - Normal stomach. - Normal examined duodenum. - No specimens collected.   Colonoscopy 10/26/2017: - One 4 mm tubular adenomatous polyp in the proximal transverse colon, removed with a cold snare. Resected and retrieved. - A tattoo was seen in the transverse colon. There was no evidence of residual polyp tissue (a 81mm flat polyp was removed and site tattooed 05/03/2013). - One 5 mm tubular adenomatous polyp at the recto-sigmoid colon, removed with a cold snare. Resected and retrieved. - Moderate diverticulosis in the sigmoid colon, in the descending colon, at the hepatic flexure and in the ascending colon. - The distal rectum and anal verge are normal on retroflexion view. - 5 year recall colonoscopy  Past Medical History:  Diagnosis Date   Arthritis    severe; pt takes strong pain meds daily/legs  Asthma    MONTH AGO   BPH (benign prostatic hypertrophy)    Diverticulosis    GERD with stricture    Hiatal hernia    History of stroke 2004   Hyperlipidemia    Hypertension    Hypothyroid    Lung nodule 02/03/2012   Osteoarthritis of left knee    Polycystic kidney disease    Stroke ([place])    2004   EQULIBRIUM, AND JUDGING DISTANCE   Tubular adenoma of colon     Past Surgical History:   Procedure Laterality Date   CARPAL TUNNEL RELEASE     COLONOSCOPY     HERNIA REPAIR  2005   with mesh   KNEE ARTHROSCOPY Bilateral 12/05/13   cyst and bone spur removed from left knee per pt.   KNEE ARTHROSCOPY Left 01/2015   NASAL POLYP EXCISION  1992 or 93   POLYPECTOMY     TOTAL KNEE ARTHROPLASTY Right 05/19/2015   Procedure: RIGHT TOTAL KNEE ARTHROPLASTY;  Surgeon: Paralee Cancel, MD;  Location: [facility];  Service: Orthopedics;  Laterality: Right;   TOTAL KNEE ARTHROPLASTY Left 11/19/2018   Procedure: LEFT  TOTAL KNEE ARTHROPLASTY;  Surgeon: Dorna Leitz, MD;  Location: [place];  Service: Orthopedics;  Laterality: Left;    Current Outpatient Medications  Medication Sig Dispense Refill   acetaminophen (TYLENOL) 325 MG tablet Take 1-2 tablets (325-650 mg total) by mouth every 6 (six) hours as needed for mild pain or moderate pain. 30 tablet 0   albuterol (PROVENTIL) (2.5 MG/3ML) 0.083% nebulizer solution Take 3 mLs (2.5 mg total) by nebulization every 6 (six) hours as needed for wheezing or shortness of breath. 270 mL 1   amLODipine (NORVASC) 10 MG tablet TAKE ONE TABLET BY MOUTH EVERY MORNING 90 tablet 1   ATROVENT HFA 17 MCG/ACT inhaler INHALE TWO PUFFS BY MOUTH INTO LUNGS EVERY 6 HOURS AS NEEDED FOR wheezing 38.7 g 1   benazepril (LOTENSIN) 20 MG tablet Take 1 tablet (20 mg total) by mouth every morning. 90 tablet 1   benzonatate (TESSALON) 100 MG capsule Take 1 capsule (100 mg total) by mouth 2 (two) times daily as needed for cough. 20 capsule 0   ciclopirox (PENLAC) 8 % solution Apply topically at bedtime. Apply over nail and surrounding skin. Apply daily over previous coat. After seven (7) days, may remove with alcohol and continue cycle. 6.6 mL 2   cyclobenzaprine (FLEXERIL) 5 MG tablet TAKE ONE TABLET BY MOUTH twice A DAY AS NEEDED FOR MUSCLE SPASMS 180 tablet 1   diphenoxylate-atropine (LOMOTIL) 2.5-0.025 MG tablet Take 1 tablet by mouth 4 (four) times daily as needed for diarrhea or  loose stools. 60 tablet 1   famotidine (PEPCID) 20 MG tablet Take 1 tablet (20 mg total) by mouth 2 (two) times daily for 7 days. 14 tablet 0   fluticasone (FLOVENT HFA) 110 MCG/ACT inhaler INHALE 2 PUFFS INTO THE LUNGS DAILY WHILE IN YOUR YELLOW ZONE. RINSE MOUTH AND SPIT OUT AFTER USE. 12 g 5   fluticasone-salmeterol (ADVAIR DISKUS) 500-50 MCG/ACT AEPB Inhale 1 puff into the lungs in the morning and at bedtime. 1 each 11   furosemide (LASIX) 20 MG tablet Take 1 tablet (20 mg total) by mouth daily. 90 tablet 1   gabapentin (NEURONTIN) 300 MG capsule TAKE ONE CAPSULE BY MOUTH threes times daily (Patient not taking: Reported on 04/27/2021) 90 capsule 5   levocetirizine (XYZAL) 5 MG tablet Take 1 tablet (5 mg total) by mouth every evening. [place]  tablet 5   levothyroxine (SYNTHROID) 200 MCG tablet Take 1 tablet (200 mcg total) by mouth daily before breakfast. 90 tablet 1   lidocaine (XYLOCAINE) 5 % ointment Apply 1 application topically 3 (three) times daily as needed. 35.44 g 0   lovastatin (MEVACOR) 40 MG tablet Take 1 tablet (40 mg total) by mouth at bedtime. 90 tablet 1   methylPREDNISolone (MEDROL DOSEPAK) 4 MG TBPK tablet Take as directed 21 tablet 0   montelukast (SINGULAIR) 10 MG tablet Take 1 tablet (10 mg total) by mouth at bedtime. 90 tablet 3   pantoprazole (PROTONIX) 40 MG tablet TAKE ONE TABLET BY MOUTH EVERY MORNING 90 tablet 3   predniSONE (DELTASONE) 10 MG tablet Take 4 tablets by mouth once daily for 2 days, then 3 tablets daily for 2 days, then 2 tablets daily for 2 days, then 1 tablet daily for 2 days 20 tablet 0   rOPINIRole (REQUIP) 0.5 MG tablet Take 1 tablet (0.5 mg total) by mouth at bedtime. 90 tablet 1   sildenafil (REVATIO) 20 MG tablet TAKE 1 TO 2 TABLETS BY MOUTH 30 MINUTES prior TO sexual activity 75 tablet 1   tamsulosin (FLOMAX) 0.4 MG CAPS capsule TAKE ONE CAPSULE BY MOUTH EVERYDAY AT BEDTIME 90 capsule 1   VENTOLIN HFA 108 (90 Base) MCG/ACT inhaler Inhale 2 puffs into the  lungs every 4 (four) hours as needed for wheezing or shortness of breath. 18 g 5   No current facility-administered medications for this visit.    Allergies as of 09/30/2021 - Review Complete 08/17/2021  Allergen Reaction Noted   Aspirin Anaphylaxis 10/04/2011   Doxycycline hyclate Swelling 08/17/2015   Ibuprofen Anaphylaxis 10/04/2011   Jynarque [tolvaptan] Anaphylaxis 04/16/2018   Peanut butter flavor Anaphylaxis 01/28/2012   Cephalexin Rash 10/04/2011    Family History  Problem Relation Age of Onset   Heart disease Mother        pacemaker   Diabetes Father    Kidney disease Father    Diabetes Sister    Kidney disease Sister    Colon cancer Neg Hx    Esophageal cancer Neg Hx    Rectal cancer Neg Hx    Stomach cancer Neg Hx    Prostate cancer Neg Hx    Pancreatic cancer Neg Hx     Social History   Socioeconomic History   Marital status: Married    Spouse name: Not on file   Number of children: 2   Years of education: Not on file   Highest education level: Not on file  Occupational History   Occupation: DISABLED    Employer: UNEMPLOYED  Tobacco Use   Smoking status: Every Day    Packs/day: 0.50    Years: 20.00    Pack years: 10.00    Types: Cigarettes   Smokeless tobacco: Never  Vaping Use   Vaping Use: Never used  Substance and Sexual Activity   Alcohol use: Yes   Drug use: No   Sexual activity: Not on file  Other Topics Concern   Not on file  Social History Narrative   Caffeine use:  1 pot coffee daily, 1 glass tea   Regular exercise:  No. Has active lifestyle.   2 biological and 1 stepson.   Former smoker- quit in 2007   He is on disability- reports that he has a history of heat stroke.              Social Determinants of Health   Financial  Resource Strain: Not on file  Food Insecurity: Not on file  Transportation Needs: Not on file  Physical Activity: Not on file  Stress: Not on file  Social Connections: Not on file  Intimate Partner  Violence: Not on file    Review of Systems:    Constitutional: No weight loss, fever or chills Cardiovascular: No chest pain Respiratory: No SOB  Gastrointestinal: See HPI and otherwise negative   Physical Exam:  Vital signs: BP 120/70   Pulse 90   Ht 5\' 7"  (1.702 m)   Wt 212 lb (96.2 kg)   BMI 33.20 kg/m    Constitutional:   Pleasant Caucasian male appears to be in NAD, Well developed, Well nourished, alert and cooperative Respiratory: Respirations even and unlabored. Lungs clear to auscultation bilaterally.   No wheezes, crackles, or rhonchi.  Cardiovascular: Normal S1, S2. No MRG. Regular rate and rhythm. No peripheral edema, cyanosis or pallor.  Gastrointestinal:  Soft, nondistended, nontender. No rebound or guarding. Normal bowel sounds. No appreciable masses or hepatomegaly. Rectal:  Not performed.  Psychiatric: Demonstrates good judgement and reason without abnormal affect or behaviors.  MOST RECENT LABS AND IMAGING: CBC    Component Value Date/Time   WBC 7.4 09/28/2020 1200   RBC 5.11 09/28/2020 1200   HGB 16.2 09/28/2020 1200   HCT 46.5 09/28/2020 1200   PLT 175.0 09/28/2020 1200   MCV 91.0 09/28/2020 1200   MCH 31.1 01/20/2020 1712   MCHC 34.8 09/28/2020 1200   RDW 13.0 09/28/2020 1200   LYMPHSABS 1.5 09/28/2020 1200   MONOABS 0.8 09/28/2020 1200   EOSABS 0.1 09/28/2020 1200   BASOSABS 0.0 09/28/2020 1200    CMP     Component Value Date/Time   NA 138 09/28/2020 1200   K 4.2 09/28/2020 1200   CL 102 09/28/2020 1200   CO2 30 09/28/2020 1200   GLUCOSE 89 09/28/2020 1200   BUN 19 09/28/2020 1200   CREATININE 1.04 09/28/2020 1200   CREATININE 0.93 08/19/2014 1114   CALCIUM 9.4 09/28/2020 1200   PROT 7.2 09/28/2020 1200   ALBUMIN 4.6 09/28/2020 1200   AST 15 09/28/2020 1200   ALT 16 09/28/2020 1200   ALKPHOS 76 09/28/2020 1200   BILITOT 0.4 09/28/2020 1200   GFRNONAA >60 01/20/2020 1712   GFRNONAA >89 08/19/2014 1114   GFRAA >60 01/20/2020 1712    GFRAA >89 08/19/2014 1114    Assessment: 1.  Dysphagia: Chronic for the patient, has benefited from empiric dilation in the past, last EGD in 2018 2.  GERD: Moderately controlled on Pantoprazole 40 mg once daily 3.  Polycystic kidney disease  Plan: 1.  Scheduled patient for repeat EGD with dilation in the [place] with Dr. Hilarie Fredrickson.  Did provide the patient a detailed list of risks for the procedure and he agrees to proceed. 2.  Continue Pantoprazole 40 mg once daily 3.  Reviewed antidysphagia measures including taking small bites, chewing well, avoiding distraction while eating and the chin tuck technique. 4.  Patient follow in clinic per recommendations from Dr. Hilarie Fredrickson after time of procedure.  Ellouise Newer, PA-C [facility] 09/30/2021, 11:02 AM  Cc: [name], NP

## 2021-09-30 NOTE — Patient Instructions (Signed)
You have been scheduled for an endoscopy. Please follow written instructions given to you at your visit today. If you use inhalers (even only as needed), please bring them with you on the day of your procedure.  If you are age 60 or older, your body mass index should be between 23-30. Your Body mass index is 33.2 kg/m. If this is out of the aforementioned range listed, please consider follow up with your Primary Care Provider.  If you are age 74 or younger, your body mass index should be between 19-25. Your Body mass index is 33.2 kg/m. If this is out of the aformentioned range listed, please consider follow up with your Primary Care Provider.   __________________________________________________________  The [facility] providers would like to encourage you to use [facility] to communicate with providers for non-urgent requests or questions.  Due to long hold times on the telephone, sending your provider a message by [facility] may be a faster and more efficient way to get a response.  Please allow 48 business hours for a response.  Please remember that this is for non-urgent requests.

## 2021-10-05 ENCOUNTER — Other Ambulatory Visit (HOSPITAL_BASED_OUTPATIENT_CLINIC_OR_DEPARTMENT_OTHER): Payer: Self-pay

## 2021-10-05 MED ORDER — INFLUENZA VAC SPLIT QUAD 0.5 ML IM SUSY
PREFILLED_SYRINGE | INTRAMUSCULAR | 0 refills | Status: DC
  Filled 2021-10-05: qty 0.5, 1d supply, fill #0

## 2021-10-20 NOTE — Progress Notes (Signed)
Addendum: Reviewed and agree with assessment and management plan. Alvita Fana M, MD  

## 2021-10-22 ENCOUNTER — Other Ambulatory Visit: Payer: Self-pay | Admitting: Family

## 2021-10-23 ENCOUNTER — Other Ambulatory Visit: Payer: Self-pay | Admitting: Family

## 2021-11-02 ENCOUNTER — Telehealth: Payer: Self-pay

## 2021-11-02 NOTE — Chronic Care Management (AMB) (Signed)
    Chronic Care Management Pharmacy Assistant   Name: Kyle Blair  MRN: [id number] DOB: 01-12-1961  Reason for Encounter: Medication coordination   Reviewed chart for medication changes ahead of medication coordination call.  No OVs, Consults, or hospital visits since last care coordination call/Pharmacist visit. (If appropriate, list visit date, provider name)  No medication changes indicated OR if recent visit, treatment plan here.  BP Readings from Last 3 Encounters:  09/30/21 120/70  05/03/21 116/90  04/27/21 120/79    Lab Results  Component Value Date   HGBA1C 5.2 01/03/2018     Patient obtains medications through [facility]  30 Days    Patient is due for next adherence delivery on: 11/15/21.  Attempted to call patient x 3 left voicemail x 2. Mobile number on file [phone] is a wrong number call.   Medications to be delivered: Furosemide 20 mg 1 tablet for breakfast  Ropinirole 0.5 mg 1 tablet for bed time  Levothyroxine 200 mcg 1 tablet for before breakfast  Benazepril 20 mg 1 tablet for breakfast  Lovastatin 40 mg 1 tablet for bed time  Lovastatin 10 mg 1 tablet for breakfast  Tamsulosin 0.4 mg 1 tablet for bed time Cyclobenzaprine 5 mg 1 tablet twice daily as needed for muscle spasms Pantoprazole 40 mg 1 tablet for breakfast Montelukast 10 mg 1 tablet for bed time Atrovent 17 mcg Inhale 2 puffs into the lungs every 6 hours as needed Advair Diskus 500-50 mcg Inhale 1 puff into the lungs in the morning and at bedtime.     Andee Poles, CMA

## 2021-11-03 ENCOUNTER — Ambulatory Visit (AMBULATORY_SURGERY_CENTER): Payer: Medicare HMO | Admitting: Internal Medicine

## 2021-11-03 ENCOUNTER — Other Ambulatory Visit: Payer: Self-pay

## 2021-11-03 ENCOUNTER — Encounter: Payer: Self-pay | Admitting: Internal Medicine

## 2021-11-03 VITALS — BP 106/62 | HR 76 | Temp 98.1°F | Resp 20 | Ht 67.0 in | Wt 212.0 lb

## 2021-11-03 DIAGNOSIS — R1319 Other dysphagia: Secondary | ICD-10-CM | POA: Diagnosis not present

## 2021-11-03 DIAGNOSIS — I10 Essential (primary) hypertension: Secondary | ICD-10-CM | POA: Diagnosis not present

## 2021-11-03 DIAGNOSIS — K449 Diaphragmatic hernia without obstruction or gangrene: Secondary | ICD-10-CM

## 2021-11-03 DIAGNOSIS — E039 Hypothyroidism, unspecified: Secondary | ICD-10-CM | POA: Diagnosis not present

## 2021-11-03 DIAGNOSIS — K222 Esophageal obstruction: Secondary | ICD-10-CM | POA: Diagnosis not present

## 2021-11-03 DIAGNOSIS — R1310 Dysphagia, unspecified: Secondary | ICD-10-CM | POA: Diagnosis not present

## 2021-11-03 MED ORDER — SODIUM CHLORIDE 0.9 % IV SOLN: 500.0000 mL | Freq: Once | INTRAVENOUS | Status: DC

## 2021-11-03 NOTE — Progress Notes (Signed)
Sedate, gd SR, tolerated procedure well, VSS, report to RN 

## 2021-11-03 NOTE — Progress Notes (Signed)
GASTROENTEROLOGY PROCEDURE H&P NOTE   Primary Care Physician: [name], NP    Reason for Procedure:  Dysphagia  Plan:    EGD with probable dilation  Patient is appropriate for endoscopic procedure(s) in the ambulatory ([facility]) setting.  The nature of the procedure, as well as the risks, benefits, and alternatives were carefully and thoroughly reviewed with the patient. Ample time for discussion and questions allowed. The patient understood, was satisfied, and agreed to proceed.     HPI: Kyle Blair is a 60 y.o. male who presents for EGD to evaluate dysphagia.  Medical history as below.  Previous EGD in November 2018 with dilation of the distal esophagus and GE junction to 18 mm with balloon.  Seen very recently in the office, no significant change in medical history since that time.  No recent chest pain or shortness of breath.  No abdominal pain today.  Past Medical History:  Diagnosis Date   Arthritis    severe; pt takes strong pain meds daily/legs   Asthma    MONTH AGO   BPH (benign prostatic hypertrophy)    Diverticulosis    GERD with stricture    Hiatal hernia    History of stroke 2004   Hyperlipidemia    Hypertension    Hypothyroid    Lung nodule 02/03/2012   Osteoarthritis of left knee    Polycystic kidney disease    Stroke ([facility])    2004   EQULIBRIUM, AND JUDGING DISTANCE   Tubular adenoma of colon     Past Surgical History:  Procedure Laterality Date   CARPAL TUNNEL RELEASE     COLONOSCOPY     HERNIA REPAIR  2005   with mesh   KNEE ARTHROSCOPY Bilateral 12/05/13   cyst and bone spur removed from left knee per pt.   KNEE ARTHROSCOPY Left 01/2015   NASAL POLYP EXCISION  1992 or 93   POLYPECTOMY     TOTAL KNEE ARTHROPLASTY Right 05/19/2015   Procedure: RIGHT TOTAL KNEE ARTHROPLASTY;  Surgeon: Paralee Cancel, MD;  Location: [facility];  Service: Orthopedics;  Laterality: Right;   TOTAL KNEE ARTHROPLASTY Left 11/19/2018   Procedure: LEFT  TOTAL  KNEE ARTHROPLASTY;  Surgeon: Dorna Leitz, MD;  Location: [facility];  Service: Orthopedics;  Laterality: Left;    Prior to Admission medications   Medication Sig Start Date End Date Taking? Authorizing Provider  acetaminophen (TYLENOL) 325 MG tablet Take 1-2 tablets (325-650 mg total) by mouth every 6 (six) hours as needed for mild pain or moderate pain. 08/18/17  Yes Domenic Moras, PA-C  albuterol (PROVENTIL) (2.5 MG/3ML) 0.083% nebulizer solution Take 3 mLs (2.5 mg total) by nebulization every 6 (six) hours as needed for wheezing or shortness of breath. 05/03/21  Yes [name], NP  amLODipine (NORVASC) 10 MG tablet TAKE ONE TABLET BY MOUTH EVERY MORNING 07/23/21  Yes [name], NP  ATROVENT HFA 17 MCG/ACT inhaler INHALE TWO PUFFS BY MOUTH INTO LUNGS EVERY 6 HOURS AS NEEDED FOR wheezing 07/22/21  Yes [name], NP  benazepril (LOTENSIN) 20 MG tablet TAKE ONE TABLET BY MOUTH EVERY MORNING 10/22/21  Yes [name], NP  benzonatate (TESSALON) 100 MG capsule Take 1 capsule (100 mg total) by mouth 2 (two) times daily as needed for cough. 05/03/21  Yes [name], NP  ciclopirox (PENLAC) 8 % solution Apply topically at bedtime. Apply over nail and surrounding skin. Apply daily over previous coat. After seven (7) days, may remove with alcohol and continue cycle.  08/17/21  Yes Trula Slade, DPM  cyclobenzaprine (FLEXERIL) 5 MG tablet TAKE ONE TABLET BY MOUTH twice A DAY AS NEEDED FOR MUSCLE SPASMS 07/22/21  Yes [name], NP  famotidine (PEPCID) 20 MG tablet Take 1 tablet (20 mg total) by mouth 2 (two) times daily for 7 days. 04/02/21 11/03/21 Yes Wendling, Crosby Oyster, DO  fluticasone (FLOVENT HFA) 110 MCG/ACT inhaler INHALE 2 PUFFS INTO THE LUNGS DAILY WHILE IN YOUR YELLOW ZONE. RINSE MOUTH AND SPIT OUT AFTER USE. 04/09/20  Yes [name], NP  fluticasone-salmeterol (ADVAIR DISKUS) 500-50 MCG/ACT AEPB Inhale 1 puff into the lungs in the morning and at  bedtime. 05/03/21  Yes [name], NP  furosemide (LASIX) 20 MG tablet TAKE ONE TABLET BY MOUTH EVERY morning 10/24/21  Yes [name], NP  levocetirizine (XYZAL) 5 MG tablet Take 1 tablet (5 mg total) by mouth every evening. 04/27/21  Yes [name], NP  levothyroxine (SYNTHROID) 200 MCG tablet TAKE ONE TABLET BY MOUTH BEFORE BREAKFAST 10/22/21  Yes [name], NP  lovastatin (MEVACOR) 40 MG tablet TAKE ONE TABLET BY MOUTH EVERYDAY AT BEDTIME 10/22/21  Yes [name], NP  pantoprazole (PROTONIX) 40 MG tablet TAKE ONE TABLET BY MOUTH EVERY MORNING 07/23/21  Yes [name], NP  rOPINIRole (REQUIP) 0.5 MG tablet TAKE ONE TABLET BY MOUTH EVERYDAY AT BEDTIME 10/22/21  Yes [name], NP  tamsulosin (FLOMAX) 0.4 MG CAPS capsule TAKE ONE CAPSULE BY MOUTH EVERYDAY AT BEDTIME 07/23/21  Yes [name], NP  VENTOLIN HFA 108 (90 Base) MCG/ACT inhaler Inhale 2 puffs into the lungs every 4 (four) hours as needed for wheezing or shortness of breath. 09/13/21  Yes [name], NP  diphenoxylate-atropine (LOMOTIL) 2.5-0.025 MG tablet Take 1 tablet by mouth 4 (four) times daily as needed for diarrhea or loose stools. 02/13/21   Noralyn Pick, NP  lidocaine (XYLOCAINE) 5 % ointment Apply 1 application topically 3 (three) times daily as needed. 07/04/20   Long, Wonda Olds, MD  montelukast (SINGULAIR) 10 MG tablet Take 1 tablet (10 mg total) by mouth at bedtime. 05/03/21   [name], NP  sildenafil (REVATIO) 20 MG tablet TAKE 1 TO 2 TABLETS BY MOUTH 30 MINUTES prior TO sexual activity 12/01/20   [name], NP    Current Outpatient Medications  Medication Sig Dispense Refill   acetaminophen (TYLENOL) 325 MG tablet Take 1-2 tablets (325-650 mg total) by mouth every 6 (six) hours as needed for mild pain or moderate pain. 30 tablet 0   albuterol (PROVENTIL) (2.5 MG/3ML) 0.083% nebulizer solution Take 3 mLs (2.5 mg total) by  nebulization every 6 (six) hours as needed for wheezing or shortness of breath. 270 mL 1   amLODipine (NORVASC) 10 MG tablet TAKE ONE TABLET BY MOUTH EVERY MORNING 90 tablet 1   ATROVENT HFA 17 MCG/ACT inhaler INHALE TWO PUFFS BY MOUTH INTO LUNGS EVERY 6 HOURS AS NEEDED FOR wheezing 38.7 g 1   benazepril (LOTENSIN) 20 MG tablet TAKE ONE TABLET BY MOUTH EVERY MORNING 30 tablet 0   benzonatate (TESSALON) 100 MG capsule Take 1 capsule (100 mg total) by mouth 2 (two) times daily as needed for cough. 20 capsule 0   ciclopirox (PENLAC) 8 % solution Apply topically at bedtime. Apply over nail and surrounding skin. Apply daily over previous coat. After seven (7) days, may remove with alcohol and continue cycle. 6.6 mL 2   cyclobenzaprine (FLEXERIL) 5 MG tablet TAKE ONE TABLET BY MOUTH twice A DAY AS NEEDED  FOR MUSCLE SPASMS 180 tablet 1   famotidine (PEPCID) 20 MG tablet Take 1 tablet (20 mg total) by mouth 2 (two) times daily for 7 days. 14 tablet 0   fluticasone (FLOVENT HFA) 110 MCG/ACT inhaler INHALE 2 PUFFS INTO THE LUNGS DAILY WHILE IN YOUR YELLOW ZONE. RINSE MOUTH AND SPIT OUT AFTER USE. 12 g 5   fluticasone-salmeterol (ADVAIR DISKUS) 500-50 MCG/ACT AEPB Inhale 1 puff into the lungs in the morning and at bedtime. 1 each 11   furosemide (LASIX) 20 MG tablet TAKE ONE TABLET BY MOUTH EVERY morning 90 tablet 1   levocetirizine (XYZAL) 5 MG tablet Take 1 tablet (5 mg total) by mouth every evening. 30 tablet 5   levothyroxine (SYNTHROID) 200 MCG tablet TAKE ONE TABLET BY MOUTH BEFORE BREAKFAST 30 tablet 0   lovastatin (MEVACOR) 40 MG tablet TAKE ONE TABLET BY MOUTH EVERYDAY AT BEDTIME 30 tablet 0   pantoprazole (PROTONIX) 40 MG tablet TAKE ONE TABLET BY MOUTH EVERY MORNING 90 tablet 3   rOPINIRole (REQUIP) 0.5 MG tablet TAKE ONE TABLET BY MOUTH EVERYDAY AT BEDTIME 30 tablet 0   tamsulosin (FLOMAX) 0.4 MG CAPS capsule TAKE ONE CAPSULE BY MOUTH EVERYDAY AT BEDTIME 90 capsule 1   VENTOLIN HFA 108 (90 Base)  MCG/ACT inhaler Inhale 2 puffs into the lungs every 4 (four) hours as needed for wheezing or shortness of breath. 18 g 5   diphenoxylate-atropine (LOMOTIL) 2.5-0.025 MG tablet Take 1 tablet by mouth 4 (four) times daily as needed for diarrhea or loose stools. 60 tablet 1   lidocaine (XYLOCAINE) 5 % ointment Apply 1 application topically 3 (three) times daily as needed. 35.44 g 0   montelukast (SINGULAIR) 10 MG tablet Take 1 tablet (10 mg total) by mouth at bedtime. 90 tablet 3   sildenafil (REVATIO) 20 MG tablet TAKE 1 TO 2 TABLETS BY MOUTH 30 MINUTES prior TO sexual activity 75 tablet 1   Current Facility-Administered Medications  Medication Dose Route Frequency Provider Last Rate Last Admin   0.9 %  sodium chloride infusion  500 mL Intravenous Once Priest Lockridge, Lajuan Lines, MD        Allergies as of 11/03/2021 - Review Complete 11/03/2021  Allergen Reaction Noted   Aspirin Anaphylaxis 10/04/2011   Doxycycline hyclate Swelling 08/17/2015   Ibuprofen Anaphylaxis 10/04/2011   Jynarque [tolvaptan] Anaphylaxis 04/16/2018   Peanut butter flavor Anaphylaxis 01/28/2012   Cephalexin Rash 10/04/2011    Family History  Problem Relation Age of Onset   Heart disease Mother        pacemaker   Diabetes Father    Kidney disease Father    Diabetes Sister    Kidney disease Sister    Colon cancer Neg Hx    Esophageal cancer Neg Hx    Rectal cancer Neg Hx    Stomach cancer Neg Hx    Prostate cancer Neg Hx    Pancreatic cancer Neg Hx     Social History   Socioeconomic History   Marital status: Married    Spouse name: Not on file   Number of children: 2   Years of education: Not on file   Highest education level: Not on file  Occupational History   Occupation: DISABLED    Employer: UNEMPLOYED  Tobacco Use   Smoking status: Every Day    Packs/day: 0.50    Years: 20.00    Pack years: 10.00    Types: Cigarettes   Smokeless tobacco: Never  Vaping Use   Vaping  Use: Never used  Substance and  Sexual Activity   Alcohol use: Yes   Drug use: No   Sexual activity: Not on file  Other Topics Concern   Not on file  Social History Narrative   Caffeine use:  1 pot coffee daily, 1 glass tea   Regular exercise:  No. Has active lifestyle.   2 biological and 1 stepson.   Former smoker- quit in 2007   He is on disability- reports that he has a history of heat stroke.              Social Determinants of Health   Financial Resource Strain: Not on file  Food Insecurity: Not on file  Transportation Needs: Not on file  Physical Activity: Not on file  Stress: Not on file  Social Connections: Not on file  Intimate Partner Violence: Not on file    Physical Exam: Vital signs in last 24 hours: @BP  116/63   Pulse 86   Temp 98.1 F (36.7 C)   Ht 5\' 7"  (1.702 m)   Wt 212 lb (96.2 kg)   SpO2 95%   BMI 33.20 kg/m  GEN: NAD EYE: Sclerae anicteric ENT: MMM CV: Non-tachycardic Pulm: CTA b/l GI: Soft, NT/ND NEURO:  Alert & Oriented x 3   Zenovia Jarred, MD [facility]  11/03/2021 2:16 PM

## 2021-11-03 NOTE — Op Note (Signed)
[facility] Patient Name: Kyle Blair Procedure Date: 11/03/2021 2:10 PM MRN: [id number] Endoscopist: Jerene Bears , MD Age: 60 Referring MD:  Date of Birth: Nov 17, 1961 Gender: Male Account #: [id number] Procedure:                Upper GI endoscopy Indications:              Dysphagia, history of GERD, remote Barrett's                            esophagus not seen on last EGD, last EGD in Nov                            2018 with dilation to 18 mm. Medicines:                Monitored Anesthesia Care Procedure:                Pre-Anesthesia Assessment:                           - Prior to the procedure, a History and Physical                            was performed, and patient medications and                            allergies were reviewed. The patient's tolerance of                            previous anesthesia was also reviewed. The risks                            and benefits of the procedure and the sedation                            options and risks were discussed with the patient.                            All questions were answered, and informed consent                            was obtained. Prior Anticoagulants: The patient has                            taken no previous anticoagulant or antiplatelet                            agents. ASA Grade Assessment: III - A patient with                            severe systemic disease. After reviewing the risks                            and benefits, the patient was deemed in  satisfactory condition to undergo the procedure.                           After obtaining informed consent, the endoscope was                            passed under direct vision. Throughout the                            procedure, the patient's blood pressure, pulse, and                            oxygen saturations were monitored continuously. The                            Endoscope was introduced through  the mouth, and                            advanced to the second part of duodenum. The upper                            GI endoscopy was accomplished without difficulty.                            The patient tolerated the procedure well. Scope In: Scope Out: Findings:                 A low-grade of narrowing Schatzki ring was found at                            the gastroesophageal junction. A TTS dilator was                            passed through the scope. Dilation with an 18-19-20                            mm balloon dilator was performed to 20 mm in the                            lower esophagus and across the GE junction/ring.                            The dilation site was examined and showed mild                            mucosal disruption.                           A 2 cm hiatal hernia was present.                           The entire examined stomach was normal.  The examined duodenum was normal. Complications:            No immediate complications. Estimated Blood Loss:     Estimated blood loss was minimal. Impression:               - Low-grade of narrowing Schatzki ring. Dilated                            with balloon to 20 mm.                           - 2 cm hiatal hernia.                           - Normal stomach.                           - Normal examined duodenum.                           - No specimens collected. Recommendation:           - Patient has a contact number available for                            emergencies. The signs and symptoms of potential                            delayed complications were discussed with the                            patient. Return to normal activities tomorrow.                            Written discharge instructions were provided to the                            patient.                           - Resume previous diet.                           - Continue present medications.                            - Repeat upper endoscopy PRN for retreatment. Jerene Bears, MD 11/03/2021 2:42:15 PM This report has been signed electronically.

## 2021-11-03 NOTE — Progress Notes (Signed)
Called to room to assist during endoscopic procedure.  Patient ID and intended procedure confirmed with present staff. Received instructions for my participation in the procedure from the performing physician.  

## 2021-11-03 NOTE — Patient Instructions (Signed)
YOU HAD AN ENDOSCOPIC PROCEDURE TODAY AT [facility]:   Refer to the procedure report that was given to you for any specific questions about what was found during the examination.  If the procedure report does not answer your questions, please call your gastroenterologist to clarify.  If you requested that your care partner not be given the details of your procedure findings, then the procedure report has been included in a sealed envelope for you to review at your convenience later.  YOU SHOULD EXPECT: Some feelings of bloating in the abdomen. Passage of more gas than usual.  Walking can help get rid of the air that was put into your GI tract during the procedure and reduce the bloating. If you had a lower endoscopy (such as a colonoscopy or flexible sigmoidoscopy) you may notice spotting of blood in your stool or on the toilet paper. If you underwent a bowel prep for your procedure, you may not have a normal bowel movement for a few days.  Please Note:  You might notice some irritation and congestion in your nose or some drainage.  This is from the oxygen used during your procedure.  There is no need for concern and it should clear up in a day or so.  SYMPTOMS TO REPORT IMMEDIATELY:  Following upper endoscopy (EGD)  Vomiting of blood or coffee ground material  New chest pain or pain under the shoulder blades  Painful or persistently difficult swallowing  New shortness of breath  Fever of 100F or higher  Black, tarry-looking stools  For urgent or emergent issues, a gastroenterologist can be reached at any hour by calling [phone]. Do not use MyChart messaging for urgent concerns.    DIET:  We do recommend a small meal at first, but then you may proceed to your regular diet.  Drink plenty of fluids but you should avoid alcoholic beverages for 24 hours.  ACTIVITY:  You should plan to take it easy for the rest of today and you should NOT DRIVE or use heavy machinery until  tomorrow (because of the sedation medicines used during the test).    FOLLOW UP: Our staff will call the number listed on your records 48-72 hours following your procedure to check on you and address any questions or concerns that you may have regarding the information given to you following your procedure. If we do not reach you, we will leave a message.  We will attempt to reach you two times.  During this call, we will ask if you have developed any symptoms of COVID 19. If you develop any symptoms (ie: fever, flu-like symptoms, shortness of breath, cough etc.) before then, please call [phone].  If you test positive for Covid 19 in the 2 weeks post procedure, please call and report this information to [place].    SIGNATURES/CONFIDENTIALITY: You and/or your care partner have signed paperwork which will be entered into your electronic medical record.  These signatures attest to the fact that that the information above on your After Visit Summary has been reviewed and is understood.  Full responsibility of the confidentiality of this discharge information lies with you and/or your care-partner.

## 2021-11-05 ENCOUNTER — Telehealth: Payer: Self-pay | Admitting: *Deleted

## 2021-11-05 NOTE — Telephone Encounter (Signed)
  Follow up Call-  Call back number 11/03/2021  Post procedure Call Back phone  # [phone]  Permission to leave phone message Yes  Some recent data might be hidden     Patient questions:  Do you have a fever, pain , or abdominal swelling? No. Pain Score  0 *  Have you tolerated food without any problems? Yes.    Have you been able to return to your normal activities? Yes.    Do you have any questions about your discharge instructions: Diet   No. Medications  No. Follow up visit  No.  Do you have questions or concerns about your Care? No.  Actions: * If pain score is 4 or above: No action needed, pain <4.  Have you developed a fever since your procedure? no  2.   Have you had an respiratory symptoms (SOB or cough) since your procedure? no  3.   Have you tested positive for COVID 19 since your procedure no  4.   Have you had any family members/close contacts diagnosed with the COVID 19 since your procedure?  no   If yes to any of these questions please route to Joylene John, RN and Joella Prince, RN

## 2021-11-05 NOTE — Telephone Encounter (Signed)
  Follow up Call-  Call back number 11/03/2021  Post procedure Call Back phone  # [phone]  Permission to leave phone message Yes  Some recent data might be hidden     Patient questions: Message left to call us if necessary.

## 2021-11-09 NOTE — Progress Notes (Signed)
Request is being handled by [facility].

## 2021-12-01 ENCOUNTER — Other Ambulatory Visit: Payer: Self-pay | Admitting: Family

## 2021-12-17 ENCOUNTER — Telehealth (INDEPENDENT_AMBULATORY_CARE_PROVIDER_SITE_OTHER): Payer: Medicare HMO | Admitting: Family

## 2021-12-17 DIAGNOSIS — J209 Acute bronchitis, unspecified: Secondary | ICD-10-CM | POA: Diagnosis not present

## 2021-12-17 MED ORDER — PREDNISONE 10 MG PO TABS: ORAL_TABLET | ORAL | 0 refills | Status: DC

## 2021-12-17 MED ORDER — AZITHROMYCIN 250 MG PO TABS: ORAL_TABLET | ORAL | 0 refills | Status: AC

## 2021-12-17 NOTE — Progress Notes (Signed)
MyChart Video Visit    Virtual Visit via Video Note   This visit type was conducted due to national recommendations for restrictions regarding the COVID-19 Pandemic (e.g. social distancing) in an effort to limit this patient's exposure and mitigate transmission in our community. This patient is at least at moderate risk for complications without adequate follow up. This format is felt to be most appropriate for this patient at this time. Physical exam was limited by quality of the video and audio technology used for the visit. CMA was able to get the patient set up on a video visit.  Patient location: Home. Patient and provider in visit Provider location: Office  I discussed the limitations of evaluation and management by telemedicine and the availability of in person appointments. The patient expressed understanding and agreed to proceed.  Visit Date: 12/17/2021  Today's healthcare provider: Nance Pear, NP     Subjective:    Patient ID: Kyle Blair, male    DOB: 1961-12-11, 60 y.o.   MRN: [id number]  Chief Complaint  Patient presents with   Nasal Congestion    Complains of congestion, tested negative for covid yesterday    HPI  Patient reports that he developed cold symptoms 3 days ago. Having lots of mucous. No know fever but has felt "hot and cold."  Breathing is tight.  He has associated cough. Using his rescue inhaler frequently.    Past Medical History:  Diagnosis Date   Arthritis    severe; pt takes strong pain meds daily/legs   Asthma    MONTH AGO   BPH (benign prostatic hypertrophy)    Diverticulosis    GERD with stricture    Hiatal hernia    History of stroke 2004   Hyperlipidemia    Hypertension    Hypothyroid    Lung nodule 02/03/2012   Osteoarthritis of left knee    Polycystic kidney disease    Stroke ([place])    2004   EQULIBRIUM, AND JUDGING DISTANCE   Tubular adenoma of colon     Past Surgical History:  Procedure Laterality Date    CARPAL TUNNEL RELEASE     COLONOSCOPY     HERNIA REPAIR  2005   with mesh   KNEE ARTHROSCOPY Bilateral 12/05/13   cyst and bone spur removed from left knee per pt.   KNEE ARTHROSCOPY Left 01/2015   NASAL POLYP EXCISION  1992 or 93   POLYPECTOMY     TOTAL KNEE ARTHROPLASTY Right 05/19/2015   Procedure: RIGHT TOTAL KNEE ARTHROPLASTY;  Surgeon: Paralee Cancel, MD;  Location: [facility];  Service: Orthopedics;  Laterality: Right;   TOTAL KNEE ARTHROPLASTY Left 11/19/2018   Procedure: LEFT  TOTAL KNEE ARTHROPLASTY;  Surgeon: Dorna Leitz, MD;  Location: [facility];  Service: Orthopedics;  Laterality: Left;    Family History  Problem Relation Age of Onset   Heart disease Mother        pacemaker   Diabetes Father    Kidney disease Father    Diabetes Sister    Kidney disease Sister    Colon cancer Neg Hx    Esophageal cancer Neg Hx    Rectal cancer Neg Hx    Stomach cancer Neg Hx    Prostate cancer Neg Hx    Pancreatic cancer Neg Hx     Social History   Socioeconomic History   Marital status: Married    Spouse name: Not on file   Number of children: 2  Years of education: Not on file   Highest education level: Not on file  Occupational History   Occupation: DISABLED    Employer: UNEMPLOYED  Tobacco Use   Smoking status: Every Day    Packs/day: 0.50    Years: 20.00    Pack years: 10.00    Types: Cigarettes   Smokeless tobacco: Never  Vaping Use   Vaping Use: Never used  Substance and Sexual Activity   Alcohol use: Yes   Drug use: No   Sexual activity: Not on file  Other Topics Concern   Not on file  Social History Narrative   Caffeine use:  1 pot coffee daily, 1 glass tea   Regular exercise:  No. Has active lifestyle.   2 biological and 1 stepson.   Former smoker- quit in 2007   He is on disability- reports that he has a history of heat stroke.              Social Determinants of Health   Financial Resource Strain: Not on file  Food Insecurity: Not on file   Transportation Needs: Not on file  Physical Activity: Not on file  Stress: Not on file  Social Connections: Not on file  Intimate Partner Violence: Not on file    Outpatient Medications Prior to Visit  Medication Sig Dispense Refill   acetaminophen (TYLENOL) 325 MG tablet Take 1-2 tablets (325-650 mg total) by mouth every 6 (six) hours as needed for mild pain or moderate pain. 30 tablet 0   albuterol (PROVENTIL) (2.5 MG/3ML) 0.083% nebulizer solution Take 3 mLs (2.5 mg total) by nebulization every 6 (six) hours as needed for wheezing or shortness of breath. 270 mL 1   amLODipine (NORVASC) 10 MG tablet TAKE ONE TABLET BY MOUTH EVERY MORNING 90 tablet 1   ATROVENT HFA 17 MCG/ACT inhaler INHALE TWO PUFFS BY MOUTH INTO LUNGS EVERY 6 HOURS AS NEEDED FOR wheezing 38.7 g 1   benazepril (LOTENSIN) 20 MG tablet TAKE ONE TABLET BY MOUTH EVERY MORNING Needs appointment for further refills 30 tablet 0   benzonatate (TESSALON) 100 MG capsule Take 1 capsule (100 mg total) by mouth 2 (two) times daily as needed for cough. 20 capsule 0   ciclopirox (PENLAC) 8 % solution Apply topically at bedtime. Apply over nail and surrounding skin. Apply daily over previous coat. After seven (7) days, may remove with alcohol and continue cycle. 6.6 mL 2   cyclobenzaprine (FLEXERIL) 5 MG tablet TAKE ONE TABLET BY MOUTH twice A DAY AS NEEDED FOR MUSCLE SPASMS 180 tablet 1   diphenoxylate-atropine (LOMOTIL) 2.5-0.025 MG tablet Take 1 tablet by mouth 4 (four) times daily as needed for diarrhea or loose stools. 60 tablet 1   fluticasone (FLOVENT HFA) 110 MCG/ACT inhaler INHALE 2 PUFFS INTO THE LUNGS DAILY WHILE IN YOUR YELLOW ZONE. RINSE MOUTH AND SPIT OUT AFTER USE. 12 g 5   fluticasone-salmeterol (ADVAIR DISKUS) 500-50 MCG/ACT AEPB Inhale 1 puff into the lungs in the morning and at bedtime. 1 each 11   furosemide (LASIX) 20 MG tablet TAKE ONE TABLET BY MOUTH EVERY morning 90 tablet 1   levocetirizine (XYZAL) 5 MG tablet Take  1 tablet (5 mg total) by mouth every evening. 30 tablet 5   levothyroxine (SYNTHROID) 200 MCG tablet TAKE ONE TABLET BY MOUTH BEFORE BREAKFAST Needs appointment for further refills 30 tablet 0   lidocaine (XYLOCAINE) 5 % ointment Apply 1 application topically 3 (three) times daily as needed. 35.44 g 0  lovastatin (MEVACOR) 40 MG tablet TAKE ONE TABLET BY MOUTH EVERYDAY AT BEDTIME 30 tablet 0   montelukast (SINGULAIR) 10 MG tablet Take 1 tablet (10 mg total) by mouth at bedtime. 90 tablet 3   pantoprazole (PROTONIX) 40 MG tablet TAKE ONE TABLET BY MOUTH EVERY MORNING 90 tablet 3   rOPINIRole (REQUIP) 0.5 MG tablet TAKE ONE TABLET BY MOUTH EVERYDAY AT BEDTIME 30 tablet 0   sildenafil (REVATIO) 20 MG tablet TAKE 1 TO 2 TABLETS BY MOUTH 30 MINUTES prior TO sexual activity 75 tablet 1   tamsulosin (FLOMAX) 0.4 MG CAPS capsule TAKE ONE CAPSULE BY MOUTH EVERYDAY AT BEDTIME 90 capsule 1   VENTOLIN HFA 108 (90 Base) MCG/ACT inhaler Inhale 2 puffs into the lungs every 4 (four) hours as needed for wheezing or shortness of breath. 18 g 5   famotidine (PEPCID) 20 MG tablet Take 1 tablet (20 mg total) by mouth 2 (two) times daily for 7 days. 14 tablet 0   No facility-administered medications prior to visit.    Allergies  Allergen Reactions   Aspirin Anaphylaxis   Doxycycline Hyclate Swelling    Facial Swelling   Ibuprofen Anaphylaxis   Jynarque [Tolvaptan] Anaphylaxis   Peanut Butter Flavor Anaphylaxis   Cephalexin Rash    ROS    See HPI Objective:    Physical Exam Constitutional:      Appearance: Normal appearance.  Pulmonary:     Effort: Pulmonary effort is normal.  Skin:    General: Skin is dry.  Neurological:     Mental Status: He is alert and oriented to person, place, and time.  Psychiatric:        Mood and Affect: Mood normal.        Behavior: Behavior normal.        Thought Content: Thought content normal.        Judgment: Judgment normal.    There were no vitals taken for  this visit. Wt Readings from Last 3 Encounters:  11/03/21 212 lb (96.2 kg)  09/30/21 212 lb (96.2 kg)  04/27/21 218 lb (98.9 kg)       Assessment & Plan:   Problem List Items Addressed This Visit       Unprioritized   Acute bronchitis with bronchospasm - Primary    New. Recommended rx with azithromycin and slow prednisone taper. Continue albuterol.        I am having Kyle Blair start on predniSONE and azithromycin. I am also having him maintain his acetaminophen, Flovent HFA, lidocaine, sildenafil, diphenoxylate-atropine, famotidine, levocetirizine, albuterol, montelukast, fluticasone-salmeterol, benzonatate, Atrovent HFA, cyclobenzaprine, amLODipine, pantoprazole, tamsulosin, ciclopirox, Ventolin HFA, furosemide, rOPINIRole, benazepril, levothyroxine, and lovastatin.  Meds ordered this encounter  Medications   predniSONE (DELTASONE) 10 MG tablet    Sig: 4 tabs by mouth once daily for 3 days, then 3 tabs daily x 3 days, then 2 tabs daily x 3 days, then 1 tab daily x 3 days    Dispense:  20 tablet    Refill:  0    Order Specific Question:   Supervising Provider    Answer:   Penni Homans A [[id number]]   azithromycin (ZITHROMAX) 250 MG tablet    Sig: Take 2 tablets on day 1, then 1 tablet daily on days 2 through 5    Dispense:  6 tablet    Refill:  0    Order Specific Question:   Supervising Provider    Answer:   Penni Homans A [[id number]]  I discussed the assessment and treatment plan with the patient. The patient was provided an opportunity to ask questions and all were answered. The patient agreed with the plan and demonstrated an understanding of the instructions.   The patient was advised to call back or seek an in-person evaluation if the symptoms worsen or if the condition fails to improve as anticipated.   Nance Pear, NP [employer] [facility] [employer] [facility] [phone] (phone) [phone] (fax)  [place]

## 2021-12-17 NOTE — Assessment & Plan Note (Signed)
New. Recommended rx with azithromycin and slow prednisone taper. Continue albuterol.

## 2021-12-17 NOTE — Patient Instructions (Signed)
Please start zpak and prednisone taper. Go to ER if increased shortness of breath.  Let us know if symptoms do not improve.

## 2021-12-21 ENCOUNTER — Other Ambulatory Visit: Payer: Self-pay | Admitting: Family

## 2022-01-03 ENCOUNTER — Other Ambulatory Visit: Payer: Self-pay | Admitting: Family

## 2022-01-03 ENCOUNTER — Telehealth: Payer: Self-pay | Admitting: Family

## 2022-01-03 NOTE — Telephone Encounter (Signed)
Pt's wife states medication is almost out and his congestion is still there, she would like to know if more medication could be sent in. Please advice.     [facility] [place], [place] - [address]  [place], [place] [address]  Phone:  [phone]  Fax:  [phone]

## 2022-01-03 NOTE — Telephone Encounter (Signed)
Pt's wife is calling again, and is trying to get rx while she out. Please advise.

## 2022-01-04 NOTE — Telephone Encounter (Signed)
Talked to patient's wife and advised her that patient needs to seen in person evaluation. She said she will talk to him about going to urgent care when he gets up.

## 2022-01-04 NOTE — Telephone Encounter (Signed)
Patient had video visit.  If symptoms are not improved, he should be seen in person.  I would recommend that he schedule a visit in our office or at an urgent care closer to his home.

## 2022-01-23 ENCOUNTER — Other Ambulatory Visit: Payer: Self-pay | Admitting: Family

## 2022-01-27 ENCOUNTER — Other Ambulatory Visit: Payer: Self-pay | Admitting: Family

## 2022-02-16 ENCOUNTER — Telehealth: Payer: Self-pay | Admitting: Family

## 2022-02-16 NOTE — Telephone Encounter (Signed)
Left message for patient to call back and schedule Medicare Annual Wellness Visit (AWV) in office.   If not able to come in office, please offer to do virtually or by telephone.  Left office number and my jabber [phone].  Last AWV:12/02/2016  Please schedule at anytime with Nurse Health Advisor.

## 2022-03-04 ENCOUNTER — Other Ambulatory Visit: Payer: Self-pay | Admitting: Family

## 2022-03-04 ENCOUNTER — Telehealth: Payer: Self-pay | Admitting: Family

## 2022-03-04 DIAGNOSIS — E039 Hypothyroidism, unspecified: Secondary | ICD-10-CM

## 2022-03-04 DIAGNOSIS — K219 Gastro-esophageal reflux disease without esophagitis: Secondary | ICD-10-CM

## 2022-03-04 NOTE — Telephone Encounter (Signed)
Patient would like Melissa to order lab work to check his thyroid levels. He believes his medicine needs to be adjusted. Patient stated he will be in town on Tuesday and would like to get them done then. Please advise.  ?

## 2022-03-06 NOTE — Telephone Encounter (Signed)
Lab Results  ?Component Value Date  ? TSH 0.47 05/14/2021  ? ?Please schedule pt a lab appointment. TSH order placed.  ?

## 2022-03-07 NOTE — Telephone Encounter (Signed)
Appt made for 03/14 ?

## 2022-03-08 ENCOUNTER — Other Ambulatory Visit (INDEPENDENT_AMBULATORY_CARE_PROVIDER_SITE_OTHER): Payer: Medicare HMO

## 2022-03-08 DIAGNOSIS — E039 Hypothyroidism, unspecified: Secondary | ICD-10-CM

## 2022-03-08 LAB — TSH: TSH: 0.06 u[IU]/mL — ABNORMAL LOW (ref 0.35–5.50)

## 2022-03-09 ENCOUNTER — Telehealth: Payer: Self-pay | Admitting: Family

## 2022-03-09 DIAGNOSIS — E039 Hypothyroidism, unspecified: Secondary | ICD-10-CM

## 2022-03-09 MED ORDER — LEVOTHYROXINE SODIUM 175 MCG PO TABS: 175.0000 ug | ORAL_TABLET | Freq: Every day | ORAL | 0 refills | Status: DC

## 2022-03-09 NOTE — Telephone Encounter (Signed)
Patient advised of results and scheduled to return for tsh 04-22-22. Rx sent and lab order entered ?

## 2022-03-09 NOTE — Telephone Encounter (Signed)
Based on lab results, I would like him to decrease his synthroid to 175 mcg once daily.  ? ?Repeat TSH in 6 weeks. I sent rx to [facility].  ?

## 2022-03-11 ENCOUNTER — Telehealth: Payer: Self-pay | Admitting: Family

## 2022-03-11 NOTE — Telephone Encounter (Signed)
Left message for patient to call back and schedule Medicare Annual Wellness Visit (AWV) in office.  ? ?If not able to come in office, please offer to do virtually or by telephone.  Left office number and my jabber [phone]. ? ?Last AWV:12/02/2016 ? ?Please schedule at anytime with Nurse Health Advisor. ?  ?

## 2022-03-18 ENCOUNTER — Telehealth: Payer: Self-pay | Admitting: Family

## 2022-03-18 NOTE — Telephone Encounter (Signed)
LVM for pt to schedule cpe with provider. Pt due for a cpe. ?

## 2022-04-13 ENCOUNTER — Other Ambulatory Visit: Payer: Self-pay | Admitting: Family

## 2022-04-22 ENCOUNTER — Encounter: Payer: Self-pay | Admitting: Family

## 2022-04-22 ENCOUNTER — Other Ambulatory Visit: Payer: Medicare HMO

## 2022-04-22 ENCOUNTER — Ambulatory Visit (INDEPENDENT_AMBULATORY_CARE_PROVIDER_SITE_OTHER): Payer: Medicare HMO | Admitting: Family

## 2022-04-22 VITALS — BP 119/64 | HR 71 | Temp 98.0°F | Resp 16 | Ht 66.0 in | Wt 202.4 lb

## 2022-04-22 DIAGNOSIS — E785 Hyperlipidemia, unspecified: Secondary | ICD-10-CM

## 2022-04-22 DIAGNOSIS — Z720 Tobacco use: Secondary | ICD-10-CM | POA: Diagnosis not present

## 2022-04-22 DIAGNOSIS — N401 Enlarged prostate with lower urinary tract symptoms: Secondary | ICD-10-CM | POA: Diagnosis not present

## 2022-04-22 DIAGNOSIS — E039 Hypothyroidism, unspecified: Secondary | ICD-10-CM | POA: Diagnosis not present

## 2022-04-22 DIAGNOSIS — G8929 Other chronic pain: Secondary | ICD-10-CM

## 2022-04-22 DIAGNOSIS — Q613 Polycystic kidney, unspecified: Secondary | ICD-10-CM | POA: Diagnosis not present

## 2022-04-22 DIAGNOSIS — M25561 Pain in right knee: Secondary | ICD-10-CM

## 2022-04-22 DIAGNOSIS — G4733 Obstructive sleep apnea (adult) (pediatric): Secondary | ICD-10-CM | POA: Diagnosis not present

## 2022-04-22 DIAGNOSIS — I10 Essential (primary) hypertension: Secondary | ICD-10-CM

## 2022-04-22 DIAGNOSIS — K22719 Barrett's esophagus with dysplasia, unspecified: Secondary | ICD-10-CM

## 2022-04-22 DIAGNOSIS — J45909 Unspecified asthma, uncomplicated: Secondary | ICD-10-CM | POA: Diagnosis not present

## 2022-04-22 DIAGNOSIS — M25562 Pain in left knee: Secondary | ICD-10-CM | POA: Diagnosis not present

## 2022-04-22 DIAGNOSIS — Z0000 Encounter for general adult medical examination without abnormal findings: Secondary | ICD-10-CM

## 2022-04-22 MED ORDER — ALBUTEROL SULFATE (2.5 MG/3ML) 0.083% IN NEBU: 2.5000 mg | INHALATION_SOLUTION | Freq: Four times a day (QID) | RESPIRATORY_TRACT | 1 refills | Status: DC | PRN

## 2022-04-22 MED ORDER — ROPINIROLE HCL 0.5 MG PO TABS: ORAL_TABLET | ORAL | 1 refills | Status: DC

## 2022-04-22 MED ORDER — ATROVENT HFA 17 MCG/ACT IN AERS: INHALATION_SPRAY | RESPIRATORY_TRACT | 1 refills | Status: DC

## 2022-04-22 MED ORDER — BENAZEPRIL HCL 20 MG PO TABS: 20.0000 mg | ORAL_TABLET | Freq: Every morning | ORAL | 1 refills | Status: DC

## 2022-04-22 MED ORDER — AMLODIPINE BESYLATE 10 MG PO TABS: 10.0000 mg | ORAL_TABLET | Freq: Every morning | ORAL | 1 refills | Status: DC

## 2022-04-22 MED ORDER — LEVOTHYROXINE SODIUM 175 MCG PO TABS: 175.0000 ug | ORAL_TABLET | Freq: Every day | ORAL | 1 refills | Status: DC

## 2022-04-22 NOTE — Assessment & Plan Note (Signed)
Lab Results  ?Component Value Date  ? CHOL 176 01/03/2018  ? HDL 56 01/03/2018  ? Roberts 99 01/03/2018  ? LDLDIRECT 97.0 10/11/2016  ? TRIG 105 01/03/2018  ? CHOLHDL 3.1 01/03/2018  ? ?Maintained on mevacor.  Obtain follow up lipid panel.  ?

## 2022-04-22 NOTE — Progress Notes (Signed)
? ?Subjective:  ? ? ? Patient ID: Kyle Blair, male    DOB: 03/31/1961, 61 y.o.   MRN: [id number] ? ?No chief complaint on file. ? ? ?HPI ? ? ?Patient presents today for complete physical. ? ?Immunizations: due for shingrix, bivalent covid booster ?Diet: has been working on weight loss ?Exercise: limited due to knee pain ?Colonoscopy:  due in November ? ?Hypothyroid- continues synthorid, ? ?OSA- not on cpap. ? ?Tobacco abuse- still smoking cigarettes ? ?PCKD- would like nephrologist closer to home. ? ?Hyperlipidemia- continues statin. ? ?Asthma- reports symptoms have been stable. ? ?He is very upset that he is no longer able to ride his motorcycle due to decreased ROM of his knees.  ? ? ? ?Health Maintenance Due  ?Topic Date Due  ? Zoster Vaccines- Shingrix (1 of 2) Never done  ? COVID-19 Vaccine (3 - Booster for Janssen series) 01/01/2021  ? ? ?Past Medical History:  ?Diagnosis Date  ? Acute metabolic encephalopathy 04/30/2562  ? Arthritis   ? severe; pt takes strong pain meds daily/legs  ? Asthma   ? MONTH AGO  ? BPH (benign prostatic hypertrophy)   ? Community acquired pneumonia 01/18/2017  ? Diverticulosis   ? GERD with stricture   ? Hiatal hernia   ? History of stroke 2004  ? Hyperlipidemia   ? Hypertension   ? Hypothyroid   ? Lung nodule 02/03/2012  ? Osteoarthritis of left knee   ? Polycystic kidney disease   ? Stroke [facility])   ? 2004   EQULIBRIUM, AND JUDGING DISTANCE  ? Tubular adenoma of colon   ? ? ?Past Surgical History:  ?Procedure Laterality Date  ? CARPAL TUNNEL RELEASE    ? COLONOSCOPY    ? HERNIA REPAIR  2005  ? with mesh  ? KNEE ARTHROSCOPY Bilateral 12/05/13  ? cyst and bone spur removed from left knee per pt.  ? KNEE ARTHROSCOPY Left 01/2015  ? NASAL POLYP EXCISION  1992 or 93  ? POLYPECTOMY    ? TOTAL KNEE ARTHROPLASTY Right 05/19/2015  ? Procedure: RIGHT TOTAL KNEE ARTHROPLASTY;  Surgeon: Paralee Cancel, MD;  [facility];  Service: Orthopedics;  Laterality: Right;  ? TOTAL KNEE ARTHROPLASTY Left  11/19/2018  ? Procedure: LEFT  TOTAL KNEE ARTHROPLASTY;  Surgeon: Dorna Leitz, MD;  Location: [facility];  Service: Orthopedics;  Laterality: Left;  ? ? ?Family History  ?Problem Relation Age of Onset  ? Heart disease Mother   ?     pacemaker  ? Diabetes Father   ? Kidney disease Father   ? Diabetes Sister   ? Kidney disease Sister   ? Colon cancer Neg Hx   ? Esophageal cancer Neg Hx   ? Rectal cancer Neg Hx   ? Stomach cancer Neg Hx   ? Prostate cancer Neg Hx   ? Pancreatic cancer Neg Hx   ? ? ?Social History  ? ?Socioeconomic History  ? Marital status: Married  ?  Spouse name: Not on file  ? Number of children: 2  ? Years of education: Not on file  ? Highest education level: Not on file  ?Occupational History  ? Occupation: DISABLED  ?  Employer: UNEMPLOYED  ?Tobacco Use  ? Smoking status: Every Day  ?  Packs/day: 0.50  ?  Years: 20.00  ?  Pack years: 10.00  ?  Types: Cigarettes  ? Smokeless tobacco: Never  ?Vaping Use  ? Vaping Use: Never used  ?Substance and Sexual Activity  ?  Alcohol use: Yes  ? Drug use: No  ? Sexual activity: Not on file  ?Other Topics Concern  ? Not on file  ?Social History Narrative  ? Caffeine use:  1 pot coffee daily, 1 glass tea  ? Regular exercise:  No. Has active lifestyle.  ? 2 biological and 1 stepson.  ? Former smoker- quit in 2007  ? He is on disability- reports that he has a history of heat stroke.    ?   ?   ?   ? ?Social Determinants of Health  ? ?Financial Resource Strain: Not on file  ?Food Insecurity: Not on file  ?Transportation Needs: Not on file  ?Physical Activity: Not on file  ?Stress: Not on file  ?Social Connections: Not on file  ?Intimate Partner Violence: Not on file  ? ? ?Outpatient Medications Prior to Visit  ?Medication Sig Dispense Refill  ? acetaminophen (TYLENOL) 325 MG tablet Take 1-2 tablets (325-650 mg total) by mouth every 6 (six) hours as needed for mild pain or moderate pain. 30 tablet 0  ? ciclopirox (PENLAC) 8 % solution Apply topically at bedtime. Apply  over nail and surrounding skin. Apply daily over previous coat. After seven (7) days, may remove with alcohol and continue cycle. 6.6 mL 2  ? cyclobenzaprine (FLEXERIL) 5 MG tablet TAKE ONE TABLET BY MOUTH twice daily AS NEEDED FOR MUSCLE SPASMS 180 tablet 0  ? diphenoxylate-atropine (LOMOTIL) 2.5-0.025 MG tablet Take 1 tablet by mouth 4 (four) times daily as needed for diarrhea or loose stools. 60 tablet 1  ? fluticasone-salmeterol (ADVAIR DISKUS) 500-50 MCG/ACT AEPB Inhale 1 puff into the lungs in the morning and at bedtime. 1 each 11  ? furosemide (LASIX) 20 MG tablet TAKE ONE TABLET BY MOUTH EVERY morning 90 tablet 1  ? levocetirizine (XYZAL) 5 MG tablet Take 1 tablet (5 mg total) by mouth every evening. 30 tablet 5  ? lidocaine (XYLOCAINE) 5 % ointment Apply 1 application topically 3 (three) times daily as needed. 35.44 g 0  ? lovastatin (MEVACOR) 40 MG tablet TAKE ONE TABLET BY MOUTH EVERYDAY AT BEDTIME 30 tablet 2  ? montelukast (SINGULAIR) 10 MG tablet Take 1 tablet (10 mg total) by mouth at bedtime. 90 tablet 3  ? pantoprazole (PROTONIX) 40 MG tablet TAKE ONE TABLET BY MOUTH EVERY MORNING 90 tablet 3  ? sildenafil (REVATIO) 20 MG tablet TAKE 1 TO 2 TABLETS BY MOUTH 30 MINUTES prior TO sexual activity 75 tablet 1  ? tamsulosin (FLOMAX) 0.4 MG CAPS capsule TAKE ONE CAPSULE BY MOUTH EVERYDAY AT BEDTIME 90 capsule 0  ? VENTOLIN HFA 108 (90 Base) MCG/ACT inhaler INHALE TWO PUFFS BY MOUTH INTO LUNGS EVERY 4 HOURS AS NEEDED FOR SHORTNESS OF BREATH OR wheezing 18 g 0  ? albuterol (PROVENTIL) (2.5 MG/3ML) 0.083% nebulizer solution Take 3 mLs (2.5 mg total) by nebulization every 6 (six) hours as needed for wheezing or shortness of breath. 270 mL 1  ? amLODipine (NORVASC) 10 MG tablet TAKE ONE TABLET BY MOUTH EVERY MORNING 90 tablet 0  ? ATROVENT HFA 17 MCG/ACT inhaler INHALE TWO PUFFS BY MOUTH INTO LUNGS EVERY 6 HOURS AS NEEDED FOR wheezing 38.7 g 1  ? benazepril (LOTENSIN) 20 MG tablet TAKE ONE TABLET BY MOUTH EVERY  MORNING 30 tablet 2  ? fluticasone (FLOVENT HFA) 110 MCG/ACT inhaler INHALE 2 PUFFS INTO THE LUNGS DAILY WHILE IN YOUR YELLOW ZONE. RINSE MOUTH AND SPIT OUT AFTER USE. 12 g 5  ? levothyroxine (SYNTHROID) 175  MCG tablet Take 1 tablet (175 mcg total) by mouth daily. 90 tablet 0  ? rOPINIRole (REQUIP) 0.5 MG tablet TAKE ONE TABLET BY MOUTH EVERYDAY AT BEDTIME 30 tablet 2  ? famotidine (PEPCID) 20 MG tablet Take 1 tablet (20 mg total) by mouth 2 (two) times daily for 7 days. 14 tablet 0  ? benzonatate (TESSALON) 100 MG capsule Take 1 capsule (100 mg total) by mouth 2 (two) times daily as needed for cough. 20 capsule 0  ? ?No facility-administered medications prior to visit.  ? ? ?Allergies  ?Allergen Reactions  ? Aspirin Anaphylaxis  ? Doxycycline Hyclate Swelling  ?  Facial Swelling  ? Ibuprofen Anaphylaxis  ? Jynarque [Tolvaptan] Anaphylaxis  ? Peanut Butter Flavor Anaphylaxis  ? Cephalexin Rash  ? ? ?Review of Systems  ?Respiratory:  Negative for wheezing.   ?Genitourinary:  Positive for frequency and urgency.  ? ?   ?Objective:  ?  ?Physical Exam ? ?BP 119/64 (BP Location: Right Arm, Patient Position: Sitting, Cuff Size: Small)   Pulse 71   Temp 98 ?F (36.7 ?C) (Oral)   Resp 16   Ht '5\' 6"'$  (1.676 m)   Wt 202 lb 6.4 oz (91.8 kg)   SpO2 96%   BMI 32.67 kg/m?  ?Wt Readings from Last 3 Encounters:  ?04/22/22 202 lb 6.4 oz (91.8 kg)  ?11/03/21 212 lb (96.2 kg)  ?09/30/21 212 lb (96.2 kg)  ? ?Physical Exam  ?Constitutional: He is oriented to person, place, and time. He appears well-developed and well-nourished. No distress.  ?HENT:  ?Head: Normocephalic and atraumatic.  ?Right Ear: Tympanic membrane and ear canal normal.  ?Left Ear: Tympanic membrane and ear canal normal.  ?Mouth/Throat: Oropharynx is clear and moist.  ?Eyes: Pupils are equal, round, and reactive to light. No scleral icterus.  ?Neck: Normal range of motion. No thyromegaly present.  ?Cardiovascular: Normal rate and regular rhythm.   ?No murmur  heard. ?Pulmonary/Chest: Effort normal and breath sounds normal. No respiratory distress. He has no wheezes. He has no rales. He exhibits no tenderness.  ?Abdominal: Soft. Bowel sounds are normal. He exhibits no d

## 2022-04-22 NOTE — Assessment & Plan Note (Signed)
Advised pt to limit tylenol dosing to no more that 4000 mg/day. ?

## 2022-04-22 NOTE — Assessment & Plan Note (Signed)
Reports asthma is stable. Continue advair,  atrovent, albuterol.  ? ?Wt Readings from Last 3 Encounters:  ?04/22/22 202 lb 6.4 oz (91.8 kg)  ?11/03/21 212 lb (96.2 kg)  ?09/30/21 212 lb (96.2 kg)  ? ? ?

## 2022-04-22 NOTE — Assessment & Plan Note (Signed)
Discussed Cessation.   ?

## 2022-04-22 NOTE — Assessment & Plan Note (Addendum)
Lab Results  ?Component Value Date  ? PSA 0.13 01/12/2018  ? ? ?Currently maintained on flomax.  ?

## 2022-04-22 NOTE — Assessment & Plan Note (Signed)
Maintained on synthroid. Obtain follow up TSH. ?

## 2022-04-22 NOTE — Assessment & Plan Note (Signed)
Needs a new nephrologist closer to home. Will arrange ?

## 2022-04-22 NOTE — Assessment & Plan Note (Signed)
Clinically stable on protonix daily and prn pepcid.  ?

## 2022-04-22 NOTE — Assessment & Plan Note (Signed)
Does not wear a cpap, was only mild.  ?

## 2022-04-23 ENCOUNTER — Telehealth: Payer: Self-pay | Admitting: Family

## 2022-04-23 DIAGNOSIS — Z0000 Encounter for general adult medical examination without abnormal findings: Secondary | ICD-10-CM | POA: Insufficient documentation

## 2022-04-23 DIAGNOSIS — E039 Hypothyroidism, unspecified: Secondary | ICD-10-CM

## 2022-04-23 LAB — COMPREHENSIVE METABOLIC PANEL
AG Ratio: 2.2 (calc) (ref 1.0–2.5)
ALT: 13 U/L (ref 9–46)
AST: 13 U/L (ref 10–35)
Albumin: 4.6 g/dL (ref 3.6–5.1)
Alkaline phosphatase (APISO): 79 U/L (ref 35–144)
BUN/Creatinine Ratio: 16 (calc) (ref 6–22)
BUN: 11 mg/dL (ref 7–25)
CO2: 24 mmol/L (ref 20–32)
Calcium: 9.7 mg/dL (ref 8.6–10.3)
Chloride: 107 mmol/L (ref 98–110)
Creat: 0.69 mg/dL — ABNORMAL LOW (ref 0.70–1.35)
Globulin: 2.1 g/dL (calc) (ref 1.9–3.7)
Glucose, Bld: 93 mg/dL (ref 65–99)
Potassium: 4 mmol/L (ref 3.5–5.3)
Sodium: 141 mmol/L (ref 135–146)
Total Bilirubin: 0.5 mg/dL (ref 0.2–1.2)
Total Protein: 6.7 g/dL (ref 6.1–8.1)

## 2022-04-23 LAB — LIPID PANEL
Cholesterol: 176 mg/dL (ref <200)
HDL: 47 mg/dL (ref >=40)
LDL Cholesterol (Calc): 107 mg/dL (calc) — ABNORMAL HIGH
Non-HDL Cholesterol (Calc): 129 mg/dL (calc) (ref <130)
Total CHOL/HDL Ratio: 3.7 (calc) (ref <5.0)
Triglycerides: 122 mg/dL (ref <150)

## 2022-04-23 LAB — TSH: TSH: 0.26 mIU/L — ABNORMAL LOW (ref 0.40–4.50)

## 2022-04-23 LAB — PSA: PSA: 0.35 ng/mL (ref <=4.00)

## 2022-04-23 MED ORDER — LEVOTHYROXINE SODIUM 150 MCG PO TABS: 150.0000 ug | ORAL_TABLET | Freq: Every day | ORAL | 1 refills | Status: DC

## 2022-04-23 NOTE — Telephone Encounter (Signed)
Please advise pt that his thyroid medication needs to be changed to 150 mcg. [facility] rx to [facility]. Repeat TSH in 6 weeks. PSA normal. Cholesterol and kidney function look good. ? ? ?

## 2022-04-23 NOTE — Assessment & Plan Note (Signed)
BP Readings from Last 3 Encounters:  ?04/22/22 119/64  ?11/03/21 106/62  ?09/30/21 120/70  ? ?BP is at goal. Continue lotensi.  ?

## 2022-04-23 NOTE — Assessment & Plan Note (Signed)
Discussed healthy diet, exercise, weight loss. Declines covid booster.  Recommended that he try to obtain shingrix at his pharmacy. [place] will be due in November. ?

## 2022-04-25 NOTE — Telephone Encounter (Signed)
Patient advised of results and medication dose change. He verbalized understanding. Scheduled to come back for TSH 06-20-22 ?

## 2022-04-27 ENCOUNTER — Other Ambulatory Visit: Payer: Self-pay | Admitting: Family

## 2022-04-28 ENCOUNTER — Other Ambulatory Visit: Payer: Self-pay | Admitting: Family

## 2022-06-20 ENCOUNTER — Other Ambulatory Visit: Payer: Medicare HMO

## 2022-06-22 DIAGNOSIS — M2041 Other hammer toe(s) (acquired), right foot: Secondary | ICD-10-CM | POA: Diagnosis not present

## 2022-06-22 DIAGNOSIS — M19071 Primary osteoarthritis, right ankle and foot: Secondary | ICD-10-CM | POA: Diagnosis not present

## 2022-06-22 DIAGNOSIS — L565 Disseminated superficial actinic porokeratosis (DSAP): Secondary | ICD-10-CM | POA: Diagnosis not present

## 2022-06-22 DIAGNOSIS — D2371 Other benign neoplasm of skin of right lower limb, including hip: Secondary | ICD-10-CM | POA: Diagnosis not present

## 2022-06-22 DIAGNOSIS — M792 Neuralgia and neuritis, unspecified: Secondary | ICD-10-CM | POA: Diagnosis not present

## 2022-06-22 DIAGNOSIS — M2042 Other hammer toe(s) (acquired), left foot: Secondary | ICD-10-CM | POA: Diagnosis not present

## 2022-06-22 DIAGNOSIS — M19072 Primary osteoarthritis, left ankle and foot: Secondary | ICD-10-CM | POA: Diagnosis not present

## 2022-07-06 DIAGNOSIS — M19079 Primary osteoarthritis, unspecified ankle and foot: Secondary | ICD-10-CM | POA: Diagnosis not present

## 2022-07-06 DIAGNOSIS — M2042 Other hammer toe(s) (acquired), left foot: Secondary | ICD-10-CM | POA: Diagnosis not present

## 2022-07-06 DIAGNOSIS — L565 Disseminated superficial actinic porokeratosis (DSAP): Secondary | ICD-10-CM | POA: Diagnosis not present

## 2022-07-06 DIAGNOSIS — M792 Neuralgia and neuritis, unspecified: Secondary | ICD-10-CM | POA: Diagnosis not present

## 2022-07-06 DIAGNOSIS — M2041 Other hammer toe(s) (acquired), right foot: Secondary | ICD-10-CM | POA: Diagnosis not present

## 2022-07-07 ENCOUNTER — Telehealth: Payer: Self-pay | Admitting: Family

## 2022-07-07 NOTE — Telephone Encounter (Signed)
Pt's spouse dropped off document to be filled out by provider (1 page Disability Parking Placard) Pt would like to have document to be mailed out when ready- Pt address [place], [place], [place] [address]. Document put at front office tray under providers name.

## 2022-07-08 NOTE — Telephone Encounter (Signed)
Form completed and mailed back to patient as requested

## 2022-07-21 ENCOUNTER — Other Ambulatory Visit: Payer: Self-pay | Admitting: Family

## 2022-07-21 ENCOUNTER — Telehealth: Payer: Self-pay | Admitting: Family

## 2022-07-21 DIAGNOSIS — K219 Gastro-esophageal reflux disease without esophagitis: Secondary | ICD-10-CM

## 2022-07-21 NOTE — Telephone Encounter (Signed)
Medication: Albuterol   Has the patient contacted their pharmacy? Yes.   (If no, request that the patient contact the pharmacy for the refill.) (If yes, when and what did the pharmacy advise?)  Preferred Pharmacy (with phone number or street name): [facility] - [place], [place] - [address]  [address], [place] [address]  Phone:  [phone]  Fax:  [phone]   Agent: Please be advised that RX refills may take up to 3 business days. We ask that you follow-up with your pharmacy.

## 2022-07-22 ENCOUNTER — Other Ambulatory Visit: Payer: Self-pay

## 2022-07-22 MED ORDER — ALBUTEROL SULFATE (2.5 MG/3ML) 0.083% IN NEBU: 2.5000 mg | INHALATION_SOLUTION | Freq: Four times a day (QID) | RESPIRATORY_TRACT | 1 refills | Status: DC | PRN

## 2022-07-22 NOTE — Telephone Encounter (Signed)
Refill sent.

## 2022-07-26 ENCOUNTER — Telehealth: Payer: Self-pay | Admitting: Family

## 2022-07-26 NOTE — Telephone Encounter (Signed)
Left message for patient to call back and schedule Medicare Annual Wellness Visit (AWV).   Please offer to do virtually or by telephone.  Left office number and my jabber [phone].  Last AWV:12/02/2016  Please schedule at anytime with Nurse Health Advisor.

## 2022-08-22 ENCOUNTER — Ambulatory Visit: Payer: Medicare HMO | Admitting: Family

## 2022-09-04 ENCOUNTER — Other Ambulatory Visit: Payer: Self-pay | Admitting: Family

## 2022-09-22 ENCOUNTER — Telehealth: Payer: Self-pay | Admitting: Family

## 2022-09-22 NOTE — Telephone Encounter (Addendum)
Pt called stating he was experiencing the following symptoms:  -Racing Heartbeat at Rest, occasionally  -No Headache  -No Fatigue/Weakness  -No Blacking/Passing out  Pt asked hostilely, "What is with all the questions?" Advised pt that we ask these questions to gauge if the pt needs to be triaged for their symptoms and advised that he be transferred to triage nurse for further eval. Pt refused stating, "Well I you are going to do that, I'm going to hang up because that's what I want to talk to [place] or Rod Holler about." Pt was put on hold to see if Lenna Sciara or Rod Holler were available. Both were out of office at the time and pt was advised of this. Triage nurse was offered one last time to eval. symptoms and pt agreed on the condition that they would not be sent to the ED. Pt was transferred to triage for further eval.

## 2022-09-23 NOTE — Telephone Encounter (Signed)
Patient reports having tachycardia at rest "about 5 times yesterday and twice today". He was advised he needs to go to ed for evaluation but he is very hesitant "has to go to a truck show today and is not going to miss that for being at ed waiting room".  I repeated the advise again and he insists he is not going "does not like the ed and he will call on Monday for appointment"

## 2022-09-23 NOTE — Telephone Encounter (Signed)
Noted  

## 2022-09-23 NOTE — Telephone Encounter (Signed)
If he is still having racing heartbeat at rest he should go to the ED.  If no current symptoms, can schedule OV with me next week.

## 2022-09-25 ENCOUNTER — Other Ambulatory Visit: Payer: Self-pay | Admitting: Family

## 2022-09-26 ENCOUNTER — Telehealth: Payer: Self-pay | Admitting: Family

## 2022-09-26 ENCOUNTER — Encounter: Payer: Self-pay | Admitting: Family

## 2022-09-26 ENCOUNTER — Ambulatory Visit (INDEPENDENT_AMBULATORY_CARE_PROVIDER_SITE_OTHER): Payer: Medicare HMO | Admitting: Family

## 2022-09-26 VITALS — BP 110/60 | HR 78 | Temp 97.9°F | Ht 67.0 in | Wt 218.0 lb

## 2022-09-26 DIAGNOSIS — I10 Essential (primary) hypertension: Secondary | ICD-10-CM

## 2022-09-26 DIAGNOSIS — E039 Hypothyroidism, unspecified: Secondary | ICD-10-CM

## 2022-09-26 DIAGNOSIS — Z720 Tobacco use: Secondary | ICD-10-CM | POA: Diagnosis not present

## 2022-09-26 DIAGNOSIS — Q613 Polycystic kidney, unspecified: Secondary | ICD-10-CM | POA: Diagnosis not present

## 2022-09-26 DIAGNOSIS — R002 Palpitations: Secondary | ICD-10-CM

## 2022-09-26 DIAGNOSIS — K219 Gastro-esophageal reflux disease without esophagitis: Secondary | ICD-10-CM | POA: Diagnosis not present

## 2022-09-26 DIAGNOSIS — E785 Hyperlipidemia, unspecified: Secondary | ICD-10-CM | POA: Diagnosis not present

## 2022-09-26 DIAGNOSIS — N401 Enlarged prostate with lower urinary tract symptoms: Secondary | ICD-10-CM | POA: Diagnosis not present

## 2022-09-26 LAB — CBC WITH DIFFERENTIAL/PLATELET
Basophils Absolute: 0 10*3/uL (ref 0.0–0.1)
Basophils Relative: 0.2 % (ref 0.0–3.0)
Eosinophils Absolute: 0.2 10*3/uL (ref 0.0–0.7)
Eosinophils Relative: 2.2 % (ref 0.0–5.0)
HCT: 46.2 % (ref 39.0–52.0)
Hemoglobin: 15.9 g/dL (ref 13.0–17.0)
Lymphocytes Relative: 15.7 % (ref 12.0–46.0)
Lymphs Abs: 1.3 10*3/uL (ref 0.7–4.0)
MCHC: 34.4 g/dL (ref 30.0–36.0)
MCV: 90.6 fl (ref 78.0–100.0)
Monocytes Absolute: 0.6 10*3/uL (ref 0.1–1.0)
Monocytes Relative: 7.7 % (ref 3.0–12.0)
Neutro Abs: 6.2 10*3/uL (ref 1.4–7.7)
Neutrophils Relative %: 74.2 % (ref 43.0–77.0)
Platelets: 170 10*3/uL (ref 150.0–400.0)
RBC: 5.1 Mil/uL (ref 4.22–5.81)
RDW: 13.7 % (ref 11.5–15.5)
WBC: 8.3 10*3/uL (ref 4.0–10.5)

## 2022-09-26 LAB — COMPREHENSIVE METABOLIC PANEL
ALT: 15 U/L (ref 0–53)
AST: 13 U/L (ref 0–37)
Albumin: 4.5 g/dL (ref 3.5–5.2)
Alkaline Phosphatase: 82 U/L (ref 39–117)
BUN: 18 mg/dL (ref 6–23)
CO2: 27 mEq/L (ref 19–32)
Calcium: 9.6 mg/dL (ref 8.4–10.5)
Chloride: 104 mEq/L (ref 96–112)
Creatinine, Ser: 1.31 mg/dL (ref 0.40–1.50)
GFR: 59.06 mL/min — ABNORMAL LOW (ref >60.00)
Glucose, Bld: 95 mg/dL (ref 70–99)
Potassium: 4.1 mEq/L (ref 3.5–5.1)
Sodium: 139 mEq/L (ref 135–145)
Total Bilirubin: 0.3 mg/dL (ref 0.2–1.2)
Total Protein: 6.6 g/dL (ref 6.0–8.3)

## 2022-09-26 LAB — TSH: TSH: 4.15 u[IU]/mL (ref 0.35–5.50)

## 2022-09-26 NOTE — Assessment & Plan Note (Signed)
BP Readings from Last 3 Encounters:  09/26/22 110/60  04/22/22 119/64  11/03/21 106/62   Stable on amlodipine '10mg'$ .

## 2022-09-26 NOTE — Assessment & Plan Note (Signed)
Stable on pantoprazole. Continue same.

## 2022-09-26 NOTE — Telephone Encounter (Signed)
Pt stated he wants Cardiology referral sent to [facility] in [place]. [place], Dr.Vybiral. [phone]

## 2022-09-26 NOTE — Assessment & Plan Note (Signed)
Check renal function. 

## 2022-09-26 NOTE — Assessment & Plan Note (Signed)
Continues to smoke.  Not ready to quit.

## 2022-09-26 NOTE — Assessment & Plan Note (Addendum)
Lab Results  Component Value Date   CHOL 176 04/22/2022   HDL 47 04/22/2022   LDLCALC 107 (H) 04/22/2022   LDLDIRECT 97.0 10/11/2016   TRIG 122 04/22/2022   CHOLHDL 3.7 04/22/2022   Continues mevacor. Fair Lipids, continue same. Could consider trying crestor next visit as LDL is slightly above goal.

## 2022-09-26 NOTE — Progress Notes (Signed)
Subjective:   By signing my name below, I, Shehryar Baig, attest that this documentation has been prepared under the direction and in the presence of [name], NP. 09/26/2022     Patient ID: Kyle Blair, male    DOB: [birth year], 61 y.o.   MRN: [id number]  Chief Complaint  Patient presents with   Palpitations    And wants blood work for tsh     Palpitations    Patient is in today for a office visit.   Palpitations- He complains of heart palpitations for a weeks since last week. He had no palpitations for the past couple of days. He was having palpitations 6-7 times a day. He had similar symptoms a while ago while taking an inhaler which is now off the market. His synthroid dosage was reduced during his last visit.  He is interested in seeing a cardiologist.  He also complains of stress due to dealing with his wife and mother frequently arguing in his home.   Blood work- He had not completed blood work in a while and is interested in completing it after this visit. He does not follow up with a kidney specialist regularly. He is willing to see a kidney specialist.   Reflux- He continues taking pantoprazole and reports doing well while taking it.   Flomax- He reports no new issues with urinating while taking Flomax. He is urinating normally while taking it.   PSA- His last PSA levels are stable.  Lab Results  Component Value Date   PSA 0.35 04/22/2022   PSA 0.13 01/12/2018   Blood pressure- His blood pressure is doing well during this visit. He continues taking 10 mg amlodipine daily PO and reports no new issues while taking it.  BP Readings from Last 3 Encounters:  09/26/22 110/60  04/22/22 119/64  11/03/21 106/62   Pulse Readings from Last 3 Encounters:  09/26/22 78  04/22/22 71  11/03/21 76   Cholesterol- His last cholesterol levels were normal. He continues taking 40 mg lovastatin daily PO and reports no new issues while taking it.  Lab Results   Component Value Date   CHOL 176 04/22/2022   HDL 47 04/22/2022   LDLCALC 107 (H) 04/22/2022   LDLDIRECT 97.0 10/11/2016   TRIG 122 04/22/2022   CHOLHDL 3.7 04/22/2022   Smoking- He continues smoking regularly at this time. He does not plan on quitting soon.   Vaccines- He is not interested in receiving the flu vaccines. He is not interested in receiving anymore vaccines.    Health Maintenance Due  Topic Date Due   Zoster Vaccines- Shingrix (1 of 2) Never done   COVID-19 Vaccine (3 - Booster for [employer] series) 01/01/2021   INFLUENZA VACCINE  07/26/2022    Past Medical History:  Diagnosis Date   Acute metabolic encephalopathy 08/29/5037   Arthritis    severe; pt takes strong pain meds daily/legs   Asthma    MONTH AGO   BPH (benign prostatic hypertrophy)    Community acquired pneumonia 01/18/2017   Diverticulosis    GERD with stricture    Hiatal hernia    History of stroke 2004   Hyperlipidemia    Hypertension    Hypothyroid    Lung nodule 02/03/2012   Osteoarthritis of left knee    Polycystic kidney disease    Stroke ([place])    2004   EQULIBRIUM, AND JUDGING DISTANCE   Tubular adenoma of colon     Past Surgical History:  Procedure Laterality Date   CARPAL TUNNEL RELEASE     COLONOSCOPY     HERNIA REPAIR  2005   with mesh   KNEE ARTHROSCOPY Bilateral 12/05/13   cyst and bone spur removed from left knee per pt.   KNEE ARTHROSCOPY Left 01/2015   NASAL POLYP EXCISION  1992 or 93   POLYPECTOMY     TOTAL KNEE ARTHROPLASTY Right 05/19/2015   Procedure: RIGHT TOTAL KNEE ARTHROPLASTY;  Surgeon: Paralee Cancel, MD;  Location: [facility];  Service: Orthopedics;  Laterality: Right;   TOTAL KNEE ARTHROPLASTY Left 11/19/2018   Procedure: LEFT  TOTAL KNEE ARTHROPLASTY;  Surgeon: Dorna Leitz, MD;  Location: [place];  Service: Orthopedics;  Laterality: Left;    Family History  Problem Relation Age of Onset   Heart disease Mother        pacemaker   Diabetes Father    Kidney disease  Father    Diabetes Sister    Kidney disease Sister    Colon cancer Neg Hx    Esophageal cancer Neg Hx    Rectal cancer Neg Hx    Stomach cancer Neg Hx    Prostate cancer Neg Hx    Pancreatic cancer Neg Hx     Social History   Socioeconomic History   Marital status: Married    Spouse name: Not on file   Number of children: 2   Years of education: Not on file   Highest education level: Not on file  Occupational History   Occupation: DISABLED    Employer: UNEMPLOYED  Tobacco Use   Smoking status: Every Day    Packs/day: 0.50    Years: 20.00    Total pack years: 10.00    Types: Cigarettes   Smokeless tobacco: Never  Vaping Use   Vaping Use: Never used  Substance and Sexual Activity   Alcohol use: Yes   Drug use: No   Sexual activity: Not on file  Other Topics Concern   Not on file  Social History Narrative   Caffeine use:  1 pot coffee daily, 1 glass tea   Regular exercise:  No. Has active lifestyle.   2 biological and 1 stepson.   Former smoker- quit in 2007   He is on disability- reports that he has a history of heat stroke.              Social Determinants of Health   Financial Resource Strain: Not on file  Food Insecurity: Not on file  Transportation Needs: Not on file  Physical Activity: Not on file  Stress: Not on file  Social Connections: Not on file  Intimate Partner Violence: Not on file    Outpatient Medications Prior to Visit  Medication Sig Dispense Refill   acetaminophen (TYLENOL) 325 MG tablet Take 1-2 tablets (325-650 mg total) by mouth every 6 (six) hours as needed for mild pain or moderate pain. 30 tablet 0   ADVAIR DISKUS 500-50 MCG/ACT AEPB Inhale 1 puff into the lungs in the morning and at bedtime. 60 each 11   albuterol (PROVENTIL) (2.5 MG/3ML) 0.083% nebulizer solution Take 3 mLs (2.5 mg total) by nebulization every 6 (six) hours as needed for wheezing or shortness of breath. 270 mL 1   amLODipine (NORVASC) 10 MG tablet Take 1 tablet  (10 mg total) by mouth every morning. 90 tablet 1   ATROVENT HFA 17 MCG/ACT inhaler INHALE TWO PUFFS BY MOUTH INTO LUNGS EVERY 6 HOURS AS NEEDED FOR WHEEZING 38.7 g 1  benazepril (LOTENSIN) 20 MG tablet Take 1 tablet (20 mg total) by mouth every morning. 90 tablet 1   cyclobenzaprine (FLEXERIL) 5 MG tablet TAKE ONE TABLET BY MOUTH twice daily AS NEEDED FOR MUSCLE SPASMS 180 tablet 1   furosemide (LASIX) 20 MG tablet TAKE ONE TABLET BY MOUTH EVERY MORNING 90 tablet 1   levocetirizine (XYZAL) 5 MG tablet Take 1 tablet (5 mg total) by mouth every evening. 30 tablet 5   levothyroxine (SYNTHROID) 150 MCG tablet Take 1 tablet (150 mcg total) by mouth daily. 90 tablet 1   lidocaine (XYLOCAINE) 5 % ointment Apply 1 application topically 3 (three) times daily as needed. 35.44 g 0   lovastatin (MEVACOR) 40 MG tablet TAKE ONE TABLET BY MOUTH EVERYDAY AT BEDTIME 90 tablet 1   montelukast (SINGULAIR) 10 MG tablet TAKE ONE TABLET BY MOUTH EVERYDAY AT BEDTIME 90 tablet 3   pantoprazole (PROTONIX) 40 MG tablet TAKE ONE TABLET BY MOUTH EVERY MORNING 90 tablet 1   rOPINIRole (REQUIP) 0.5 MG tablet TAKE ONE TABLET BY MOUTH EVERYDAY AT BEDTIME 90 tablet 1   sildenafil (REVATIO) 20 MG tablet TAKE 1 TO 2 TABLETS BY MOUTH 30 MINUTES prior TO sexual activity 75 tablet 1   tamsulosin (FLOMAX) 0.4 MG CAPS capsule TAKE ONE CAPSULE BY MOUTH EVERYDAY AT BEDTIME 90 capsule 1   VENTOLIN HFA 108 (90 Base) MCG/ACT inhaler INHALE TWO PUFFS BY MOUTH INTO LUNGS EVERY 4 HOURS AS NEEDED FOR WHEEZING AND/OR SHORTNESS OF BREATH 18 g 0   diphenoxylate-atropine (LOMOTIL) 2.5-0.025 MG tablet Take 1 tablet by mouth 4 (four) times daily as needed for diarrhea or loose stools. 60 tablet 1   ciclopirox (PENLAC) 8 % solution Apply topically at bedtime. Apply over nail and surrounding skin. Apply daily over previous coat. After seven (7) days, may remove with alcohol and continue cycle. 6.6 mL 2   famotidine (PEPCID) 20 MG tablet Take 1 tablet  (20 mg total) by mouth 2 (two) times daily for 7 days. 14 tablet 0   No facility-administered medications prior to visit.    Allergies  Allergen Reactions   Aspirin Anaphylaxis   Doxycycline Hyclate Swelling    Facial Swelling   Ibuprofen Anaphylaxis   Jynarque [Tolvaptan] Anaphylaxis   Peanut Butter Flavor Anaphylaxis   Cephalexin Rash    Review of Systems  Cardiovascular:  Positive for palpitations.       Objective:    Physical Exam Constitutional:      General: He is not in acute distress.    Appearance: Normal appearance. He is not ill-appearing.  HENT:     Head: Normocephalic and atraumatic.     Right Ear: External ear normal.     Left Ear: External ear normal.  Eyes:     Extraocular Movements: Extraocular movements intact.     Pupils: Pupils are equal, round, and reactive to light.  Cardiovascular:     Rate and Rhythm: Normal rate and regular rhythm.     Heart sounds: Normal heart sounds. No murmur heard.    No gallop.  Pulmonary:     Effort: Pulmonary effort is normal. No respiratory distress.     Breath sounds: Normal breath sounds. No wheezing or rales.  Skin:    General: Skin is warm and dry.  Neurological:     Mental Status: He is alert and oriented to person, place, and time.  Psychiatric:        Judgment: Judgment normal.     BP 110/60  Pulse 78   Temp 97.9 F (36.6 C) (Oral)   Ht '5\' 7"'  (1.702 m)   Wt 218 lb (98.9 kg)   SpO2 95%   BMI 34.14 kg/m  Wt Readings from Last 3 Encounters:  09/26/22 218 lb (98.9 kg)  04/22/22 202 lb 6.4 oz (91.8 kg)  11/03/21 212 lb (96.2 kg)       Assessment & Plan:   Problem List Items Addressed This Visit       Unprioritized   Tobacco abuse    Continues to smoke. Not ready to quit.       Polycystic kidney disease    Check renal function.       Relevant Orders   Comp Met (CMET)   Hypothyroid    He did not follow up as scheduled for repeat tsh following reduction of his synthroid. Will check  today.       Relevant Orders   TSH   Hyperlipidemia    Lab Results  Component Value Date   CHOL 176 04/22/2022   HDL 47 04/22/2022   LDLCALC 107 (H) 04/22/2022   LDLDIRECT 97.0 10/11/2016   TRIG 122 04/22/2022   CHOLHDL 3.7 04/22/2022  Continues mevacor. Fair Lipids, continue same. Could consider trying crestor next visit as LDL is slightly above goal.       HTN (hypertension)    BP Readings from Last 3 Encounters:  09/26/22 110/60  04/22/22 119/64  11/03/21 106/62  Stable on amlodipine 12m.       Heart palpitations - Primary    EKG tracing is personally reviewed.  EKG notes NSR.  No acute changes.   Will check labs as ordered. Discussed that it would be a good idea for him to see cardiology to complete a zio patch monitor.      Relevant Orders   EKG 12-Lead (Completed)   Comp Met (CMET)   CBC with Differential/Platelet   Ambulatory referral to Cardiology   GERD (gastroesophageal reflux disease)    Stable on pantoprazole. Continue same.       Benign prostatic hyperplasia    Urinating well on flomax.  Lab Results  Component Value Date   PSA 0.35 04/22/2022   PSA 0.13 01/12/2018           No orders of the defined types were placed in this encounter.   I, MNance Pear NP, personally preformed the services described in this documentation.  All medical record entries made by the scribe were at my direction and in my presence.  I have reviewed the chart and discharge instructions (if applicable) and agree that the record reflects my personal performance and is accurate and complete. 09/26/2022   I,Shehryar Baig,acting as a scribe for MNance Pear NP.,have documented all relevant documentation on the behalf of MNance Pear NP,as directed by  MNance Pear NP while in the presence of MNance Pear NP.   MNance Pear NP

## 2022-09-26 NOTE — Assessment & Plan Note (Signed)
He did not follow up as scheduled for repeat tsh following reduction of his synthroid. Will check today.

## 2022-09-26 NOTE — Assessment & Plan Note (Signed)
Urinating well on flomax.  Lab Results  Component Value Date   PSA 0.35 04/22/2022   PSA 0.13 01/12/2018

## 2022-09-26 NOTE — Assessment & Plan Note (Signed)
EKG tracing is personally reviewed.  EKG notes NSR.  No acute changes.   Will check labs as ordered. Discussed that it would be a good idea for him to see cardiology to complete a zio patch monitor.

## 2022-09-27 NOTE — Telephone Encounter (Signed)
Referral has been sent already.

## 2022-10-05 DIAGNOSIS — M792 Neuralgia and neuritis, unspecified: Secondary | ICD-10-CM | POA: Diagnosis not present

## 2022-10-05 DIAGNOSIS — M2042 Other hammer toe(s) (acquired), left foot: Secondary | ICD-10-CM | POA: Diagnosis not present

## 2022-10-05 DIAGNOSIS — B351 Tinea unguium: Secondary | ICD-10-CM | POA: Diagnosis not present

## 2022-10-05 DIAGNOSIS — M19079 Primary osteoarthritis, unspecified ankle and foot: Secondary | ICD-10-CM | POA: Diagnosis not present

## 2022-10-05 DIAGNOSIS — M2041 Other hammer toe(s) (acquired), right foot: Secondary | ICD-10-CM | POA: Diagnosis not present

## 2022-10-05 DIAGNOSIS — L600 Ingrowing nail: Secondary | ICD-10-CM | POA: Diagnosis not present

## 2022-10-05 DIAGNOSIS — L565 Disseminated superficial actinic porokeratosis (DSAP): Secondary | ICD-10-CM | POA: Diagnosis not present

## 2022-10-10 ENCOUNTER — Other Ambulatory Visit: Payer: Self-pay | Admitting: Family

## 2022-10-11 DIAGNOSIS — Z733 Stress, not elsewhere classified: Secondary | ICD-10-CM | POA: Diagnosis not present

## 2022-10-11 DIAGNOSIS — Z720 Tobacco use: Secondary | ICD-10-CM | POA: Diagnosis not present

## 2022-10-11 DIAGNOSIS — R079 Chest pain, unspecified: Secondary | ICD-10-CM | POA: Diagnosis not present

## 2022-10-11 DIAGNOSIS — R002 Palpitations: Secondary | ICD-10-CM | POA: Diagnosis not present

## 2022-10-18 DIAGNOSIS — R002 Palpitations: Secondary | ICD-10-CM | POA: Diagnosis not present

## 2022-10-18 DIAGNOSIS — R079 Chest pain, unspecified: Secondary | ICD-10-CM | POA: Diagnosis not present

## 2022-10-18 DIAGNOSIS — R0600 Dyspnea, unspecified: Secondary | ICD-10-CM | POA: Diagnosis not present

## 2022-10-19 ENCOUNTER — Other Ambulatory Visit: Payer: Self-pay | Admitting: Family

## 2022-10-21 DIAGNOSIS — R002 Palpitations: Secondary | ICD-10-CM | POA: Diagnosis not present

## 2022-10-25 ENCOUNTER — Other Ambulatory Visit: Payer: Self-pay | Admitting: Family

## 2022-10-27 ENCOUNTER — Telehealth: Payer: Self-pay | Admitting: Family

## 2022-10-27 NOTE — Telephone Encounter (Signed)
Patient called to advise that he gets a 90 day supply and only got 1 inhaler. Patient is requesting the additional inhalers to be called in.

## 2022-10-27 NOTE — Telephone Encounter (Signed)
Patient advised, per [facility], they sent him one 18 gm inhaler because the prescription we sent did not have any refills. Patient advised to let us know before he runs out so we can send for 1 plus 2 refills.

## 2022-11-08 DIAGNOSIS — I480 Paroxysmal atrial fibrillation: Secondary | ICD-10-CM | POA: Diagnosis not present

## 2022-11-08 DIAGNOSIS — Z6833 Body mass index (BMI) 33.0-33.9, adult: Secondary | ICD-10-CM | POA: Diagnosis not present

## 2022-11-08 DIAGNOSIS — R079 Chest pain, unspecified: Secondary | ICD-10-CM | POA: Diagnosis not present

## 2022-11-08 DIAGNOSIS — Z720 Tobacco use: Secondary | ICD-10-CM | POA: Diagnosis not present

## 2022-12-05 ENCOUNTER — Telehealth (INDEPENDENT_AMBULATORY_CARE_PROVIDER_SITE_OTHER): Payer: Medicare HMO | Admitting: Family Medicine

## 2022-12-05 ENCOUNTER — Telehealth: Payer: Medicare HMO | Admitting: Family

## 2022-12-05 ENCOUNTER — Telehealth: Payer: Self-pay | Admitting: Family

## 2022-12-05 DIAGNOSIS — Z91199 Patient's noncompliance with other medical treatment and regimen due to unspecified reason: Secondary | ICD-10-CM

## 2022-12-05 DIAGNOSIS — J45909 Unspecified asthma, uncomplicated: Secondary | ICD-10-CM

## 2022-12-05 DIAGNOSIS — J45901 Unspecified asthma with (acute) exacerbation: Secondary | ICD-10-CM | POA: Diagnosis not present

## 2022-12-05 MED ORDER — PREDNISONE 10 MG (21) PO TBPK: ORAL_TABLET | ORAL | 0 refills | Status: DC

## 2022-12-05 MED ORDER — AZITHROMYCIN 250 MG PO TABS: ORAL_TABLET | ORAL | 0 refills | Status: AC

## 2022-12-05 NOTE — Progress Notes (Unsigned)
Assessment/Plan:   Problem List Items Addressed This Visit   None      Subjective:  HPI:  Kyle Blair is a 61 y.o. male who has Hypothyroid; Polycystic kidney disease; Asthma; Benign prostatic hyperplasia; Hyperlipidemia; GERD (gastroesophageal reflux disease); Arthralgia of right knee; Hx of low back pain; HTN (hypertension); Barrett's esophagus; Hx of adenomatous colonic polyps; Undiagnosed cardiac murmurs; Obese; Indeterminate pulmonary nodules; Stroke (cerebrum) ([place]); Tobacco abuse; Onychomycosis; Hemochromatosis carrier; OSA (obstructive sleep apnea); Primary osteoarthritis of left knee; Preventative health care; and Heart palpitations on their problem list..   He  has a past medical history of Acute metabolic encephalopathy (02/28/1442), Arthritis, Asthma, BPH (benign prostatic hypertrophy), Community acquired pneumonia (01/18/2017), Diverticulosis, GERD with stricture, Hiatal hernia, History of stroke (2004), Hyperlipidemia, Hypertension, Hypothyroid, Lung nodule (02/03/2012), Osteoarthritis of left knee, Polycystic kidney disease, Stroke ([place]), and Tubular adenoma of colon.Marland Kitchen   He presents with chief complaint of Asthma (Wheezing and tightness of chest x day after thanksgiving. Tan mucus) .   Virtual   Asthma for life long, has tan spitting up   Tried mucinex and otc cough syrup  Using nebulizer 3 times a day and albuterol inhaler  ED and return precautions  Telephone visit  Asthma Exacerbation: He has previously been evaluated here for asthma and now presents with an asthma exacerbation. This exacerbation began {0-10:33138} {time units:11} ago.  Associated symptoms include {respiratory symptoms:16811}.  Suspected precipitants include {asthma precips:408}.  Symptoms have been {clinical course - history:17::"unchanged"} since their onset.  This is the {first to tenth:10190} evaluation that has occurred during this exacerbation. The previous exacerbation occurred {0-10:33138}  {time units:11} ago. He has treated this current exacerbation with {Therapies; meds asthma:410::"short-acting inhaled beta-adrenergic agonists"}. {med adherence:16553::"The patient reports adherence to this regimen"}   Past Surgical History:  Procedure Laterality Date   CARPAL TUNNEL RELEASE     COLONOSCOPY     HERNIA REPAIR  2005   with mesh   KNEE ARTHROSCOPY Bilateral 12/05/13   cyst and bone spur removed from left knee per pt.   KNEE ARTHROSCOPY Left 01/2015   NASAL POLYP EXCISION  1992 or 93   POLYPECTOMY     TOTAL KNEE ARTHROPLASTY Right 05/19/2015   Procedure: RIGHT TOTAL KNEE ARTHROPLASTY;  Surgeon: Paralee Cancel, MD;  Location: [facility];  Service: Orthopedics;  Laterality: Right;   TOTAL KNEE ARTHROPLASTY Left 11/19/2018   Procedure: LEFT  TOTAL KNEE ARTHROPLASTY;  Surgeon: Dorna Leitz, MD;  Location: [place];  Service: Orthopedics;  Laterality: Left;    Outpatient Medications Prior to Visit  Medication Sig Dispense Refill   acetaminophen (TYLENOL) 325 MG tablet Take 1-2 tablets (325-650 mg total) by mouth every 6 (six) hours as needed for mild pain or moderate pain. 30 tablet 0   ADVAIR DISKUS 500-50 MCG/ACT AEPB Inhale 1 puff into the lungs in the morning and at bedtime. 60 each 11   albuterol (PROVENTIL) (2.5 MG/3ML) 0.083% nebulizer solution Take 3 mLs (2.5 mg total) by nebulization every 6 (six) hours as needed for wheezing or shortness of breath. 270 mL 1   albuterol (VENTOLIN HFA) 108 (90 Base) MCG/ACT inhaler INHALE TWO PUFFS BY MOUTH INTO LUNGS EVERY 4 HOURS AS NEEDED FOR WHEEZING AND/OR SHORTNESS OF BREATH 18 g 0   amLODipine (NORVASC) 10 MG tablet TAKE ONE TABLET BY MOUTH EVERY MORNING 90 tablet 1   ATROVENT HFA 17 MCG/ACT inhaler INHALE TWO PUFFS BY MOUTH INTO LUNGS EVERY 6 HOURS AS NEEDED FOR WHEEZING 38.7 g 1  benazepril (LOTENSIN) 20 MG tablet Take 1 tablet (20 mg total) by mouth every morning. 90 tablet 1   cyclobenzaprine (FLEXERIL) 5 MG tablet TAKE ONE TABLET BY  MOUTH twice daily AS NEEDED FOR MUSCLE SPASMS 180 tablet 1   furosemide (LASIX) 20 MG tablet Take 1 tablet (20 mg total) by mouth every morning. 90 tablet 1   levocetirizine (XYZAL) 5 MG tablet Take 1 tablet (5 mg total) by mouth every evening. 30 tablet 5   levothyroxine (SYNTHROID) 150 MCG tablet Take 1 tablet (150 mcg total) by mouth daily before breakfast. 90 tablet 1   lovastatin (MEVACOR) 40 MG tablet TAKE ONE TABLET BY MOUTH EVERYDAY AT BEDTIME 90 tablet 1   montelukast (SINGULAIR) 10 MG tablet TAKE ONE TABLET BY MOUTH EVERYDAY AT BEDTIME 90 tablet 3   pantoprazole (PROTONIX) 40 MG tablet TAKE ONE TABLET BY MOUTH EVERY MORNING 90 tablet 1   rOPINIRole (REQUIP) 0.5 MG tablet Take 1 tablet (0.5 mg total) by mouth at bedtime. 90 tablet 1   sildenafil (REVATIO) 20 MG tablet TAKE 1 TO 2 TABLETS BY MOUTH 30 MINUTES prior TO sexual activity 75 tablet 1   tamsulosin (FLOMAX) 0.4 MG CAPS capsule TAKE ONE CAPSULE BY MOUTH EVERYDAY AT BEDTIME 90 capsule 1   lidocaine (XYLOCAINE) 5 % ointment Apply 1 application topically 3 (three) times daily as needed. (Patient not taking: Reported on 12/05/2022) 35.44 g 0   No facility-administered medications prior to visit.    Family History  Problem Relation Age of Onset   Heart disease Mother        pacemaker   Diabetes Father    Kidney disease Father    Diabetes Sister    Kidney disease Sister    Colon cancer Neg Hx    Esophageal cancer Neg Hx    Rectal cancer Neg Hx    Stomach cancer Neg Hx    Prostate cancer Neg Hx    Pancreatic cancer Neg Hx     Social History   Socioeconomic History   Marital status: Married    Spouse name: Not on file   Number of children: 2   Years of education: Not on file   Highest education level: Not on file  Occupational History   Occupation: DISABLED    Employer: UNEMPLOYED  Tobacco Use   Smoking status: Every Day    Packs/day: 0.50    Years: 20.00    Total pack years: 10.00    Types: Cigarettes    Smokeless tobacco: Never  Vaping Use   Vaping Use: Never used  Substance and Sexual Activity   Alcohol use: Yes   Drug use: No   Sexual activity: Not on file  Other Topics Concern   Not on file  Social History Narrative   Caffeine use:  1 pot coffee daily, 1 glass tea   Regular exercise:  No. Has active lifestyle.   2 biological and 1 stepson.   Former smoker- quit in 2007   He is on disability- reports that he has a history of heat stroke.              Social Determinants of Health   Financial Resource Strain: Not on file  Food Insecurity: Not on file  Transportation Needs: Not on file  Physical Activity: Not on file  Stress: Not on file  Social Connections: Not on file  Intimate Partner Violence: Not on file  Objective:  Physical Exam: There were no vitals taken for this visit.   ***General: No acute distress. Awake and conversant.  Eyes: Normal conjunctiva, anicteric. Round symmetric pupils.  ENT: Hearing grossly intact. No nasal discharge.  Neck: Neck is supple. No masses or thyromegaly.  Respiratory: Respirations are non-labored. No auditory wheezing.  Skin: Warm. No rashes or ulcers.  Psych: Alert and oriented. Cooperative, Appropriate mood and affect, Normal judgment.  CV: No cyanosis or JVD MSK: Normal ambulation. No clubbing  Neuro: Sensation and CN II-XII grossly normal.        Alesia Banda, MD, MS

## 2022-12-05 NOTE — Telephone Encounter (Signed)
Patient called to follow up on his mychart visit since it's past the appt time. Patient said he should have been contacted by his cell # [phone]. That number is not listed. Please call to advise about appt.

## 2022-12-05 NOTE — Progress Notes (Signed)
Pt did not log onto visit. Was unavailable by phone x 2- cma left message on voicemail.

## 2022-12-05 NOTE — Telephone Encounter (Signed)
Tried calling Pt twice to triage him for his 12:40 VV. Pt did not answer, I also left a VM letting pt know I would be calling him back in 5 mins to triage for appt.

## 2022-12-30 ENCOUNTER — Other Ambulatory Visit: Payer: Self-pay | Admitting: Family

## 2023-01-02 ENCOUNTER — Telehealth: Payer: Medicare HMO | Admitting: Emergency Medicine

## 2023-01-03 ENCOUNTER — Telehealth (INDEPENDENT_AMBULATORY_CARE_PROVIDER_SITE_OTHER): Payer: Medicare HMO | Admitting: Emergency Medicine

## 2023-01-03 ENCOUNTER — Encounter: Payer: Self-pay | Admitting: Emergency Medicine

## 2023-01-03 DIAGNOSIS — Z8709 Personal history of other diseases of the respiratory system: Secondary | ICD-10-CM | POA: Diagnosis not present

## 2023-01-03 DIAGNOSIS — Z20828 Contact with and (suspected) exposure to other viral communicable diseases: Secondary | ICD-10-CM | POA: Diagnosis not present

## 2023-01-03 DIAGNOSIS — R0989 Other specified symptoms and signs involving the circulatory and respiratory systems: Secondary | ICD-10-CM | POA: Diagnosis not present

## 2023-01-03 DIAGNOSIS — J22 Unspecified acute lower respiratory infection: Secondary | ICD-10-CM

## 2023-01-03 DIAGNOSIS — R051 Acute cough: Secondary | ICD-10-CM

## 2023-01-03 MED ORDER — AZITHROMYCIN 250 MG PO TABS: ORAL_TABLET | ORAL | 0 refills | Status: DC

## 2023-01-03 MED ORDER — BENZONATATE 200 MG PO CAPS: 200.0000 mg | ORAL_CAPSULE | Freq: Two times a day (BID) | ORAL | 0 refills | Status: DC | PRN

## 2023-01-03 MED ORDER — PREDNISONE 20 MG PO TABS: 40.0000 mg | ORAL_TABLET | Freq: Every day | ORAL | 0 refills | Status: AC

## 2023-01-03 NOTE — Progress Notes (Addendum)
Telemedicine Encounter- SOAP NOTE Established Patient Unable to do MyChart video conference Patient: Home  Provider: Office   Patient present only  This telephone encounter was conducted with the patient's (or proxy's) verbal consent via audio telecommunications: yes/no: Yes Patient was instructed to have this encounter in a suitably private space; and to only have persons present to whom they give permission to participate. In addition, patient identity was confirmed by use of name plus two identifiers (DOB and address).  I discussed the limitations, risks, security and privacy concerns of performing an evaluation and management service by telephone and the availability of in person appointments. I also discussed with the patient that there may be a patient responsible charge related to this service. The patient expressed understanding and agreed to proceed.  I spent a total of TIME; 0 MIN TO 60 MIN: 20 minutes talking with the patient or their proxy.  No chief complaint on file.   Subjective   Kyle Blair is a 62 y.o. male established patient. Telephone visit today complaining of flulike symptoms for about a week.  Patient has history of asthma.  Mother and wife recently diagnosed with RSV infection and both in the hospital.  Mostly complaining of productive cough with occasional wheezing and chest congestion. Denies difficulty breathing.  No other associated symptoms. No other needs or medical concerns today.  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   Tobacco abuse 01/03/2018   Indeterminate pulmonary nodules 07/23/2015   Obese 05/20/2015   Undiagnosed cardiac murmurs 04/15/2015   Hx of  adenomatous colonic polyps 05/08/2013   Barrett's esophagus 05/03/2013   HTN (hypertension) 04/02/2013   Hx of low back pain 09/26/2012   Arthralgia of right knee 05/02/2012   Asthma 12/13/2011   Benign prostatic hyperplasia 12/13/2011   Hyperlipidemia 12/13/2011   GERD (gastroesophageal reflux disease) 12/13/2011   Hypothyroid    Polycystic kidney disease     Past Medical History:  Diagnosis Date   Acute metabolic encephalopathy 05/01/8468   Arthritis    severe; pt takes strong pain meds daily/legs   Asthma    MONTH AGO   BPH (benign prostatic hypertrophy)    Community acquired pneumonia 01/18/2017   Diverticulosis    GERD with stricture    Hiatal hernia    History of stroke 2004   Hyperlipidemia    Hypertension    Hypothyroid    Lung nodule 02/03/2012   Osteoarthritis of left knee    Polycystic kidney disease    Stroke ([place])    2004   EQULIBRIUM, AND JUDGING DISTANCE   Tubular adenoma of colon     Current Outpatient Medications  Medication Sig Dispense Refill   acetaminophen (TYLENOL) 325 MG tablet Take 1-2 tablets (325-650 mg total) by mouth every 6 (six) hours as needed for mild pain or moderate pain. 30 tablet 0   ADVAIR DISKUS 500-50 MCG/ACT AEPB Inhale 1 puff into the lungs in the morning and at bedtime. 60 each 11   albuterol (PROVENTIL) (2.5 MG/3ML) 0.083% nebulizer solution Take 3 mLs (2.5 mg total) by nebulization every 6 (six) hours as needed for wheezing or shortness of breath. 270 mL 1   amLODipine (NORVASC) 10 MG tablet TAKE ONE  TABLET BY MOUTH EVERY MORNING 90 tablet 1   ATROVENT HFA 17 MCG/ACT inhaler INHALE TWO PUFFS BY MOUTH INTO LUNGS EVERY 6 HOURS AS NEEDED FOR WHEEZING 38.7 g 1   benazepril (LOTENSIN) 20 MG tablet Take 1 tablet (20 mg total) by mouth every morning. 90 tablet 1   cyclobenzaprine (FLEXERIL) 5 MG tablet TAKE ONE TABLET BY MOUTH twice daily AS NEEDED FOR MUSCLE SPASMS 180 tablet 1   furosemide (LASIX) 20 MG tablet Take 1 tablet (20 mg total)  by mouth every morning. 90 tablet 1   levocetirizine (XYZAL) 5 MG tablet Take 1 tablet (5 mg total) by mouth every evening. 30 tablet 5   levothyroxine (SYNTHROID) 150 MCG tablet Take 1 tablet (150 mcg total) by mouth daily before breakfast. 90 tablet 1   lidocaine (XYLOCAINE) 5 % ointment Apply 1 application topically 3 (three) times daily as needed. (Patient not taking: Reported on 12/05/2022) 35.44 g 0   lovastatin (MEVACOR) 40 MG tablet TAKE ONE TABLET BY MOUTH EVERYDAY AT BEDTIME 90 tablet 1   montelukast (SINGULAIR) 10 MG tablet TAKE ONE TABLET BY MOUTH EVERYDAY AT BEDTIME 90 tablet 3   pantoprazole (PROTONIX) 40 MG tablet TAKE ONE TABLET BY MOUTH EVERY MORNING 90 tablet 1   predniSONE (STERAPRED UNI-PAK 21 TAB) 10 MG (21) TBPK tablet Take as directed 21 tablet 0   rOPINIRole (REQUIP) 0.5 MG tablet Take 1 tablet (0.5 mg total) by mouth at bedtime. 90 tablet 1   sildenafil (REVATIO) 20 MG tablet TAKE 1 TO 2 TABLETS BY MOUTH 30 MINUTES prior TO sexual activity 75 tablet 1   tamsulosin (FLOMAX) 0.4 MG CAPS capsule TAKE ONE CAPSULE BY MOUTH EVERYDAY AT BEDTIME 90 capsule 1   VENTOLIN HFA 108 (90 Base) MCG/ACT inhaler INHALE TWO PUFFS BY MOUTH INTO LUNGS EVERY 4 HOURS AS NEEDED FOR WHEEZING AND/OR SHORTNESS OF BREATH 18 g 0   No current facility-administered medications for this visit.    Allergies  Allergen Reactions   Aspirin Anaphylaxis   Doxycycline Hyclate Swelling    Facial Swelling   Ibuprofen Anaphylaxis   Jynarque [Tolvaptan] Anaphylaxis   Peanut Butter Flavor Anaphylaxis   Cephalexin Rash    Social History   Socioeconomic History   Marital status: Married    Spouse name: Not on file   Number of children: 2   Years of education: Not on file   Highest education level: Not on file  Occupational History   Occupation: DISABLED    Employer: UNEMPLOYED  Tobacco Use   Smoking status: Every Day    Packs/day: 0.50    Years: 20.00    Total pack years: 10.00    Types:  Cigarettes   Smokeless tobacco: Never  Vaping Use   Vaping Use: Never used  Substance and Sexual Activity   Alcohol use: Yes   Drug use: No   Sexual activity: Not on file  Other Topics Concern   Not on file  Social History Narrative   Caffeine use:  1 pot coffee daily, 1 glass tea   Regular exercise:  No. Has active lifestyle.   2 biological and 1 stepson.   Former smoker- quit in 2007   He is on disability- reports that he has a history of heat stroke.              Social Determinants of Health   Financial Resource Strain: Not on file  Food Insecurity: Not on file  Transportation Needs: Not on file  Physical  Activity: Not on file  Stress: Not on file  Social Connections: Not on file  Intimate Partner Violence: Not on file    Review of Systems  Constitutional: Negative.  Negative for chills and fever.  HENT:  Positive for congestion. Negative for sore throat.   Respiratory:  Positive for cough, sputum production and wheezing.   Cardiovascular: Negative.  Negative for chest pain and palpitations.  Gastrointestinal:  Negative for abdominal pain, diarrhea, nausea and vomiting.  Skin: Negative.  Negative for rash.  Neurological: Negative.  Negative for dizziness and headaches.  All other systems reviewed and are negative.   Objective   Vitals as reported by the patient: There were no vitals filed for this visit.  Problem List Items Addressed This Visit   None Visit Diagnoses     Lower respiratory infection    -  Primary   Relevant Medications   azithromycin (ZITHROMAX) 250 MG tablet   predniSONE (DELTASONE) 20 MG tablet   RSV exposure       History of asthma       Relevant Medications   predniSONE (DELTASONE) 20 MG tablet   Acute cough       Relevant Medications   benzonatate (TESSALON) 200 MG capsule   Chest congestion       Relevant Medications   predniSONE (DELTASONE) 20 MG tablet     Clinically stable.  No red flag signs or symptoms. History of  asthma.  Asthma flaring up.  Recommend to continue albuterol inhaler and start prednisone 40 mg daily for 5 days Productive cough.  May be developing secondary bacterial infection.  Recommend to start azithromycin daily for 5 days Cough management discussed.  Continue over-the-counter Mucinex DM and start Tessalon 200 mg 3 times a day as needed Advised to rest and stay well-hydrated ED precautions was given. Advised to contact the office if no better or worse during the next several days    I discussed the assessment and treatment plan with the patient. The patient was provided an opportunity to ask questions and all were answered. The patient agreed with the plan and demonstrated an understanding of the instructions.   The patient was advised to call back or seek an in-person evaluation if the symptoms worsen or if the condition fails to improve as anticipated.  I provided 20 minutes of non-face-to-face time during this encounter including preparing for this visit, review of most recent office visit notes, review of multiple chronic medical conditions and their management, review of all medications, diagnosis of viral respiratory infection and management, cough management, prognosis, ED precautions, documentation and need for follow-up  Horald Pollen, MD  Primary Care at [facility]

## 2023-01-08 ENCOUNTER — Other Ambulatory Visit: Payer: Self-pay | Admitting: Family

## 2023-01-08 DIAGNOSIS — K219 Gastro-esophageal reflux disease without esophagitis: Secondary | ICD-10-CM

## 2023-01-10 ENCOUNTER — Encounter: Payer: Self-pay | Admitting: Internal Medicine

## 2023-01-24 ENCOUNTER — Telehealth: Payer: Self-pay | Admitting: Family

## 2023-01-24 NOTE — Telephone Encounter (Signed)
[facility] called to get an ok on a 3 month supply for pt's Ventolin.

## 2023-01-25 ENCOUNTER — Telehealth: Payer: Self-pay

## 2023-01-25 NOTE — Telephone Encounter (Signed)
PA form received from [place]. Form completed and faxed back to [phone]. Form sent for scanning.

## 2023-01-26 NOTE — Telephone Encounter (Signed)
PA approved. Effective 12/26/22 to 12/26/23.  

## 2023-01-27 NOTE — Telephone Encounter (Signed)
Order has been sent

## 2023-02-27 ENCOUNTER — Other Ambulatory Visit: Payer: Self-pay | Admitting: Family

## 2023-03-01 ENCOUNTER — Other Ambulatory Visit: Payer: Self-pay

## 2023-03-01 MED ORDER — ADVAIR DISKUS 500-50 MCG/ACT IN AEPB: 1.0000 | INHALATION_SPRAY | Freq: Two times a day (BID) | RESPIRATORY_TRACT | 11 refills | Status: DC

## 2023-03-07 ENCOUNTER — Encounter: Payer: Self-pay | Admitting: Family

## 2023-03-07 ENCOUNTER — Ambulatory Visit (INDEPENDENT_AMBULATORY_CARE_PROVIDER_SITE_OTHER): Payer: Medicare HMO | Admitting: Family

## 2023-03-07 VITALS — BP 124/75 | HR 96 | Temp 98.2°F | Resp 16 | Wt 223.0 lb

## 2023-03-07 DIAGNOSIS — E785 Hyperlipidemia, unspecified: Secondary | ICD-10-CM | POA: Diagnosis not present

## 2023-03-07 DIAGNOSIS — I10 Essential (primary) hypertension: Secondary | ICD-10-CM

## 2023-03-07 DIAGNOSIS — N401 Enlarged prostate with lower urinary tract symptoms: Secondary | ICD-10-CM

## 2023-03-07 DIAGNOSIS — Z1211 Encounter for screening for malignant neoplasm of colon: Secondary | ICD-10-CM

## 2023-03-07 DIAGNOSIS — E039 Hypothyroidism, unspecified: Secondary | ICD-10-CM

## 2023-03-07 DIAGNOSIS — Z720 Tobacco use: Secondary | ICD-10-CM

## 2023-03-07 DIAGNOSIS — K22719 Barrett's esophagus with dysplasia, unspecified: Secondary | ICD-10-CM | POA: Diagnosis not present

## 2023-03-07 DIAGNOSIS — M7021 Olecranon bursitis, right elbow: Secondary | ICD-10-CM | POA: Diagnosis not present

## 2023-03-07 DIAGNOSIS — Q613 Polycystic kidney, unspecified: Secondary | ICD-10-CM

## 2023-03-07 NOTE — Progress Notes (Signed)
Subjective:   By signing my name below, I, Kyle Blair, attest that this documentation has been prepared under the direction and in the presence of Kyle Alar, NP. 03/07/2023.   Patient ID: Kyle Blair, male    DOB: [birth year], 62 y.o.   MRN: [id number]  Chief Complaint  Patient presents with   Mass    Patient reports having a growing mass on his right elbow.     HPI Patient is in today for an office visit.  Elbow swelling/pain:  He complains of a growing mass on his right elbow associated with pain and swelling. Initial onset was 1 month ago, but his symptoms have been worsening over the past 2 weeks. He is unable to rest his elbow on any surface. His pain is exacerbated with certain movements such as cutting meat. He denies any personal history of gout.  Knee stiffness:  It is very difficult for him to bend or ambulate his knees very far, which he demonstrates today. While sitting he lifts his knees upwards only slightly. In the past he has had bilateral knee replacements. He also notes that he is beginning to feel arthritis in his hips.  Prostate:  He believes his prostate is enlarged. He complains of urinary urgency but only produces 100 cc of urine at one time.  Acid reflux:  He is compliant with his pantoprazole. His acid reflux is very rare, but can be severe when it occurs.  Exercise:  Despite his pain, he makes sure to stay active.  Tobacco use:  Currently he smokes about 1/2 ppd of cigarettes. He will also use two delta 10 gummies daily.  Past Medical History:  Diagnosis Date   Acute metabolic encephalopathy [id number]   Arthritis    severe; pt takes strong pain meds daily/legs   Asthma    MONTH AGO   BPH (benign prostatic hypertrophy)    Community acquired pneumonia 01/18/2017   Diverticulosis    GERD with stricture    Hiatal hernia    History of stroke 2004   Hyperlipidemia    Hypertension    Hypothyroid    Lung nodule 02/03/2012   Osteoarthritis  of left knee    Polycystic kidney disease    Stroke ([place])    2004   EQULIBRIUM, AND JUDGING DISTANCE   Tubular adenoma of colon     Past Surgical History:  Procedure Laterality Date   CARPAL TUNNEL RELEASE     COLONOSCOPY     HERNIA REPAIR  2005   with mesh   KNEE ARTHROSCOPY Bilateral 12/05/13   cyst and bone spur removed from left knee per pt.   KNEE ARTHROSCOPY Left 01/2015   NASAL POLYP EXCISION  1992 or 93   POLYPECTOMY     TOTAL KNEE ARTHROPLASTY Right 05/19/2015   Procedure: RIGHT TOTAL KNEE ARTHROPLASTY;  Surgeon: Paralee Cancel, MD;  Location: [facility];  Service: Orthopedics;  Laterality: Right;   TOTAL KNEE ARTHROPLASTY Left 11/19/2018   Procedure: LEFT  TOTAL KNEE ARTHROPLASTY;  Surgeon: Dorna Leitz, MD;  Location: [facility];  Service: Orthopedics;  Laterality: Left;    Family History  Problem Relation Age of Onset   Heart disease Mother        pacemaker   Diabetes Father    Kidney disease Father    Diabetes Sister    Kidney disease Sister    Colon cancer Neg Hx    Esophageal cancer Neg Hx    Rectal cancer Neg Hx  Stomach cancer Neg Hx    Prostate cancer Neg Hx    Pancreatic cancer Neg Hx     Social History   Socioeconomic History   Marital status: Married    Spouse name: Not on file   Number of children: 2   Years of education: Not on file   Highest education level: Not on file  Occupational History   Occupation: DISABLED    Employer: UNEMPLOYED  Tobacco Use   Smoking status: Every Day    Packs/day: 0.50    Years: 20.00    Total pack years: 10.00    Types: Cigarettes   Smokeless tobacco: Never  Vaping Use   Vaping Use: Never used  Substance and Sexual Activity   Alcohol use: Yes   Drug use: No   Sexual activity: Not on file  Other Topics Concern   Not on file  Social History Narrative   Caffeine use:  1 pot coffee daily, 1 glass tea   Regular exercise:  No. Has active lifestyle.   2 biological and 1 stepson.   Former smoker- quit in 2007    He is on disability- reports that he has a history of heat stroke.              Social Determinants of Health   Financial Resource Strain: Not on file  Food Insecurity: Not on file  Transportation Needs: Not on file  Physical Activity: Not on file  Stress: Not on file  Social Connections: Not on file  Intimate Partner Violence: Not on file    Outpatient Medications Prior to Visit  Medication Sig Dispense Refill   acetaminophen (TYLENOL) 325 MG tablet Take 1-2 tablets (325-650 mg total) by mouth every 6 (six) hours as needed for mild pain or moderate pain. 30 tablet 0   ADVAIR DISKUS 500-50 MCG/ACT AEPB Inhale 1 puff into the lungs 2 (two) times daily. in the morning and at bedtime. 60 each 11   albuterol (PROVENTIL) (2.5 MG/3ML) 0.083% nebulizer solution Use 1 vial in nebulizer every SIX (six) hours as needed FOR wheezing OR shortness of breath.] 270 mL 1   amLODipine (NORVASC) 10 MG tablet TAKE ONE TABLET BY MOUTH EVERY MORNING 90 tablet 1   ATROVENT HFA 17 MCG/ACT inhaler INHALE TWO PUFFS BY MOUTH INTO LUNGS EVERY 6 HOURS AS NEEDED FOR WHEEZING 38.7 g 1   benazepril (LOTENSIN) 20 MG tablet Take 1 tablet (20 mg total) by mouth every morning. 90 tablet 1   cyclobenzaprine (FLEXERIL) 5 MG tablet TAKE ONE TABLET BY MOUTH TWICE DAILY AS NEEDED FOR MUSCLE SPASMS 180 tablet 1   furosemide (LASIX) 20 MG tablet Take 1 tablet (20 mg total) by mouth every morning. 90 tablet 1   levocetirizine (XYZAL) 5 MG tablet Take 1 tablet (5 mg total) by mouth every evening. 30 tablet 5   levothyroxine (SYNTHROID) 150 MCG tablet Take 1 tablet (150 mcg total) by mouth daily before breakfast. 90 tablet 1   lidocaine (XYLOCAINE) 5 % ointment Apply 1 application topically 3 (three) times daily as needed. 35.44 g 0   lovastatin (MEVACOR) 40 MG tablet TAKE ONE TABLET BY MOUTH EVERYDAY AT BEDTIME 90 tablet 1   montelukast (SINGULAIR) 10 MG tablet TAKE ONE TABLET BY MOUTH EVERYDAY AT BEDTIME 90 tablet 3    pantoprazole (PROTONIX) 40 MG tablet TAKE ONE TABLET BY MOUTH EVERY MORNING 90 tablet 1   rOPINIRole (REQUIP) 0.5 MG tablet Take 1 tablet (0.5 mg total) by mouth at bedtime.  90 tablet 1   sildenafil (REVATIO) 20 MG tablet TAKE 1 TO 2 TABLETS BY MOUTH 30 MINUTES prior TO sexual activity 75 tablet 1   tamsulosin (FLOMAX) 0.4 MG CAPS capsule TAKE ONE CAPSULE BY MOUTH EVERYDAY AT BEDTIME 90 capsule 1   VENTOLIN HFA 108 (90 Base) MCG/ACT inhaler INHALE TWO PUFFS BY MOUTH INTO LUNGS EVERY 4 HOURS AS NEEDED FOR WHEEZING AND/OR SHORTNESS OF BREATH 18 g 0   azithromycin (ZITHROMAX) 250 MG tablet Sig as indicated 6 tablet 0   benzonatate (TESSALON) 200 MG capsule Take 1 capsule (200 mg total) by mouth 2 (two) times daily as needed for cough. 20 capsule 0   No facility-administered medications prior to visit.    Allergies  Allergen Reactions   Aspirin Anaphylaxis   Doxycycline Hyclate Swelling    Facial Swelling   Ibuprofen Anaphylaxis   Jynarque [Tolvaptan] Anaphylaxis   Peanut Butter Flavor Anaphylaxis   Cephalexin Rash    Review of Systems  Genitourinary:  Positive for urgency.  Musculoskeletal:  Positive for joint pain (Right elbow).       Stiffness of bilateral knees.       Objective:    Physical Exam Constitutional:      General: He is not in acute distress.    Appearance: Normal appearance. He is not ill-appearing.  HENT:     Head: Normocephalic and atraumatic.     Right Ear: Tympanic membrane, ear canal and external ear normal.     Left Ear: Tympanic membrane, ear canal and external ear normal.  Eyes:     Extraocular Movements: Extraocular movements intact.     Pupils: Pupils are equal, round, and reactive to light.  Cardiovascular:     Rate and Rhythm: Normal rate and regular rhythm.     Heart sounds: Normal heart sounds. No murmur heard.    No gallop.  Pulmonary:     Effort: Pulmonary effort is normal. No respiratory distress.     Breath sounds: Normal breath sounds. No  wheezing or rales.  Musculoskeletal:     Right elbow: Swelling present.     Left elbow: Normal.     Comments: Swollen olecranon bursa, tender to touch  Skin:    General: Skin is warm and dry.  Neurological:     General: No focal deficit present.     Mental Status: He is alert and oriented to person, place, and time.  Psychiatric:        Mood and Affect: Mood normal.        Behavior: Behavior normal.     BP 124/75   Pulse 96   Temp 98.2 F (36.8 C) (Oral)   Resp 16   Wt 223 lb (101.2 kg)   SpO2 97%   BMI 34.93 kg/m  Wt Readings from Last 3 Encounters:  03/07/23 223 lb (101.2 kg)  09/26/22 218 lb (98.9 kg)  04/22/22 202 lb 6.4 oz (91.8 kg)       Assessment & Plan:   Problem List Items Addressed This Visit       Unprioritized   Tobacco abuse    Still smoking 1/2 PPD.  I think he is depressed but he declines further medication/treatment of his mood.      Olecranon bursitis of right elbow - Primary    New. Refer to sports medicine.       Relevant Orders   Uric acid   Ambulatory referral to Sports Medicine   Hypothyroid    Lab Results  Component Value Date   TSH 4.15 09/26/2022  Clinically stable on synthroid. Continue same, update TSH.       Relevant Orders   TSH   Hyperlipidemia    Lab Results  Component Value Date   CHOL 176 04/22/2022   HDL 47 04/22/2022   LDLCALC 107 (H) 04/22/2022   LDLDIRECT 97.0 10/11/2016   TRIG 122 04/22/2022   CHOLHDL 3.7 04/22/2022  Tolerating mevacor.  Continue same, obtain follow up lipid panel.       Relevant Orders   Comp Met (CMET)   Lipid panel   HTN (hypertension)    BP Readings from Last 3 Encounters:  03/07/23 124/75  09/26/22 110/60  04/22/22 119/64  BP stable on benazapril and amlodipine.      Benign prostatic hyperplasia    Uncontrolled. Will repeat PSA, continue flomax, declines urology referral at this time.       Relevant Orders   PSA   Barrett's esophagus    Pt continues pantoprazole.   Clinically stable.        Other Visit Diagnoses     Screening for colon cancer       Relevant Orders   Ambulatory referral to Gastroenterology        No orders of the defined types were placed in this encounter.   I, Nance Pear, NP, personally preformed the services described in this documentation.  All medical record entries made by the scribe were at my direction and in my presence.  I have reviewed the chart and discharge instructions (if applicable) and agree that the record reflects my personal performance and is accurate and complete. 03/07/2023.  I,Mathew Stumpf,acting as a Education administrator for [employer], NP.,have documented all relevant documentation on the behalf of Nance Pear, NP,as directed by  Nance Pear, NP while in the presence of Nance Pear, NP.   Nance Pear, NP

## 2023-03-07 NOTE — Assessment & Plan Note (Signed)
Uncontrolled. Will repeat PSA, continue flomax, declines urology referral at this time.

## 2023-03-07 NOTE — Assessment & Plan Note (Addendum)
Still smoking 1/2 PPD. Patient is counseled on cessation. I think he is depressed but he declines further medication/treatment of his mood.

## 2023-03-07 NOTE — Assessment & Plan Note (Signed)
Lab Results  Component Value Date   CHOL 176 04/22/2022   HDL 47 04/22/2022   LDLCALC 107 (H) 04/22/2022   LDLDIRECT 97.0 10/11/2016   TRIG 122 04/22/2022   CHOLHDL 3.7 04/22/2022   Tolerating mevacor.  Continue same, obtain follow up lipid panel.

## 2023-03-07 NOTE — Assessment & Plan Note (Signed)
BP Readings from Last 3 Encounters:  03/07/23 124/75  09/26/22 110/60  04/22/22 119/64   BP stable on benazapril and amlodipine.

## 2023-03-07 NOTE — Assessment & Plan Note (Signed)
New. Refer to sports medicine. ?

## 2023-03-07 NOTE — Assessment & Plan Note (Signed)
Lab Results  Component Value Date   TSH 4.15 09/26/2022   Clinically stable on synthroid. Continue same, update TSH.

## 2023-03-07 NOTE — Assessment & Plan Note (Signed)
Pt continues pantoprazole.  Clinically stable.

## 2023-03-07 NOTE — Assessment & Plan Note (Signed)
He declines to follow up with nephrologist.  Offered nephrologist closer to home and he declines.

## 2023-03-08 LAB — LIPID PANEL
Cholesterol: 177 mg/dL (ref 0–200)
HDL: 40.9 mg/dL (ref >39.00)
LDL Cholesterol: 103 mg/dL — ABNORMAL HIGH (ref 0–99)
NonHDL: 136.02
Total CHOL/HDL Ratio: 4
Triglycerides: 164 mg/dL — ABNORMAL HIGH (ref 0.0–149.0)
VLDL: 32.8 mg/dL (ref 0.0–40.0)

## 2023-03-08 LAB — COMPREHENSIVE METABOLIC PANEL
ALT: 17 U/L (ref 0–53)
AST: 13 U/L (ref 0–37)
Albumin: 4.4 g/dL (ref 3.5–5.2)
Alkaline Phosphatase: 76 U/L (ref 39–117)
BUN: 13 mg/dL (ref 6–23)
CO2: 27 mEq/L (ref 19–32)
Calcium: 9.8 mg/dL (ref 8.4–10.5)
Chloride: 105 mEq/L (ref 96–112)
Creatinine, Ser: 0.88 mg/dL (ref 0.40–1.50)
GFR: 93.01 mL/min (ref >60.00)
Glucose, Bld: 78 mg/dL (ref 70–99)
Potassium: 4.1 mEq/L (ref 3.5–5.1)
Sodium: 142 mEq/L (ref 135–145)
Total Bilirubin: 0.4 mg/dL (ref 0.2–1.2)
Total Protein: 6.6 g/dL (ref 6.0–8.3)

## 2023-03-08 LAB — TSH: TSH: 1.23 u[IU]/mL (ref 0.35–5.50)

## 2023-03-08 LAB — PSA: PSA: 0.28 ng/mL (ref 0.10–4.00)

## 2023-03-08 LAB — URIC ACID: Uric Acid, Serum: 5.5 mg/dL (ref 4.0–7.8)

## 2023-04-03 ENCOUNTER — Encounter: Payer: Self-pay | Admitting: *Deleted

## 2023-04-03 ENCOUNTER — Telehealth: Payer: Self-pay | Admitting: Family

## 2023-04-03 ENCOUNTER — Ambulatory Visit: Payer: Medicare HMO | Admitting: Family

## 2023-04-03 NOTE — Telephone Encounter (Signed)
Letter sent.

## 2023-04-03 NOTE — Telephone Encounter (Signed)
Please send pt no show letter. 

## 2023-04-08 ENCOUNTER — Other Ambulatory Visit: Payer: Self-pay | Admitting: Family

## 2023-04-12 ENCOUNTER — Telehealth: Payer: Self-pay | Admitting: Family

## 2023-04-12 NOTE — Telephone Encounter (Signed)
Pt called to look into waiving the no show charge he had received for a missed appt on 4.8.24. Advised pt that the appt had been made on a visit he had with Melissa on 10.2.23 since she wanted him to follow up with her in 6 months. Also advised pt that a text had been sent to remind him of his appt on 4.5.24. Pt was perplexed that we would send him a text reminder stating, "Why would you send a text reminder? I am an adult and not one of those teenagers who spends all their time on their phone. I don't know how to use that cause they didn't teach [place] how to do that in the 1960s." Pt also stated he did not get a reminder of any kind, phone call or otherwise about the appt. Advised a note would be sent back to look into waiving this.

## 2023-04-15 ENCOUNTER — Other Ambulatory Visit: Payer: Self-pay | Admitting: Family

## 2023-04-17 ENCOUNTER — Other Ambulatory Visit: Payer: Self-pay

## 2023-04-17 ENCOUNTER — Telehealth: Payer: Self-pay | Admitting: Family

## 2023-04-17 MED ORDER — VENTOLIN HFA 108 (90 BASE) MCG/ACT IN AERS: INHALATION_SPRAY | RESPIRATORY_TRACT | 0 refills | Status: DC

## 2023-04-17 MED ORDER — ATROVENT HFA 17 MCG/ACT IN AERS: INHALATION_SPRAY | RESPIRATORY_TRACT | 1 refills | Status: DC

## 2023-04-17 NOTE — Telephone Encounter (Signed)
Prescription Request  04/17/2023  Is this a "Controlled Substance" medicine? No  LOV: 03/07/2023  What is the name of the medication or equipment?   ATROVENT HFA 17 MCG/ACT inhaler [[id number]]   VENTOLIN HFA 108 (90 Base) MCG/ACT inhaler [[id number]]   Have you contacted your pharmacy to request a refill? Yes   Which pharmacy would you like this sent to?  [facility] - [place], [place] - [address] [place] [address] Phone: [phone] Fax: [phone]  Patient notified that their request is being sent to the clinical staff for review and that they should receive a response within 2 business days.   Please advise at Mobile There is no such number on file (mobile).

## 2023-04-17 NOTE — Telephone Encounter (Signed)
Prescriptions sent

## 2023-04-19 ENCOUNTER — Other Ambulatory Visit: Payer: Self-pay

## 2023-04-19 MED ORDER — VENTOLIN HFA 108 (90 BASE) MCG/ACT IN AERS: INHALATION_SPRAY | RESPIRATORY_TRACT | 1 refills | Status: DC

## 2023-07-13 ENCOUNTER — Other Ambulatory Visit: Payer: Self-pay | Admitting: Family

## 2023-07-13 DIAGNOSIS — K219 Gastro-esophageal reflux disease without esophagitis: Secondary | ICD-10-CM

## 2023-07-19 ENCOUNTER — Telehealth: Payer: Self-pay

## 2023-07-19 NOTE — Telephone Encounter (Signed)
Reached out to patient to set up follow up visit with provider to discuss chronic conditions.  Telephone encounter attempt : 1   A HIPAA compliant voice message was left requesting a return call.  Instructed patient to call office or to call me at [phone].  Kyle Blair [facility] Specialist

## 2023-08-22 ENCOUNTER — Other Ambulatory Visit: Payer: Self-pay | Admitting: Family

## 2023-08-22 DIAGNOSIS — K219 Gastro-esophageal reflux disease without esophagitis: Secondary | ICD-10-CM

## 2023-08-24 ENCOUNTER — Other Ambulatory Visit: Payer: Self-pay | Admitting: Family

## 2023-08-24 DIAGNOSIS — K219 Gastro-esophageal reflux disease without esophagitis: Secondary | ICD-10-CM

## 2023-08-25 ENCOUNTER — Other Ambulatory Visit: Payer: Self-pay | Admitting: Family

## 2023-08-28 ENCOUNTER — Other Ambulatory Visit: Payer: Self-pay | Admitting: Family

## 2023-09-26 HISTORY — PX: OTHER SURGICAL HISTORY: SHX169

## 2023-09-27 ENCOUNTER — Other Ambulatory Visit: Payer: Self-pay | Admitting: Family

## 2023-09-28 NOTE — Telephone Encounter (Signed)
Only number on file is wife's phone. Advised her to have him call us back since she did not remember her phone off the top of her head.

## 2023-09-28 NOTE — Telephone Encounter (Signed)
Please contact pt to schedule a follow up visit.  

## 2023-10-11 ENCOUNTER — Encounter: Payer: Self-pay | Admitting: Family

## 2023-10-11 ENCOUNTER — Ambulatory Visit: Payer: Medicare HMO | Admitting: Family

## 2023-10-11 ENCOUNTER — Ambulatory Visit (HOSPITAL_BASED_OUTPATIENT_CLINIC_OR_DEPARTMENT_OTHER)
Admission: RE | Admit: 2023-10-11 | Discharge: 2023-10-11 | Disposition: A | Discharge status: Home / Self Care | Payer: Medicare HMO | Source: Ambulatory Visit | Attending: Family | Admitting: Family

## 2023-10-11 ENCOUNTER — Telehealth: Payer: Self-pay | Admitting: Family

## 2023-10-11 VITALS — BP 130/85 | HR 77 | Temp 98.4°F | Resp 16 | Ht 67.0 in | Wt 229.0 lb

## 2023-10-11 DIAGNOSIS — Q613 Polycystic kidney, unspecified: Secondary | ICD-10-CM | POA: Diagnosis not present

## 2023-10-11 DIAGNOSIS — K22719 Barrett's esophagus with dysplasia, unspecified: Secondary | ICD-10-CM | POA: Diagnosis not present

## 2023-10-11 DIAGNOSIS — N401 Enlarged prostate with lower urinary tract symptoms: Secondary | ICD-10-CM

## 2023-10-11 DIAGNOSIS — J45909 Unspecified asthma, uncomplicated: Secondary | ICD-10-CM

## 2023-10-11 DIAGNOSIS — R918 Other nonspecific abnormal finding of lung field: Secondary | ICD-10-CM

## 2023-10-11 DIAGNOSIS — E039 Hypothyroidism, unspecified: Secondary | ICD-10-CM

## 2023-10-11 DIAGNOSIS — I10 Essential (primary) hypertension: Secondary | ICD-10-CM

## 2023-10-11 DIAGNOSIS — R1310 Dysphagia, unspecified: Secondary | ICD-10-CM

## 2023-10-11 DIAGNOSIS — E785 Hyperlipidemia, unspecified: Secondary | ICD-10-CM

## 2023-10-11 DIAGNOSIS — Z720 Tobacco use: Secondary | ICD-10-CM

## 2023-10-11 DIAGNOSIS — I4891 Unspecified atrial fibrillation: Secondary | ICD-10-CM

## 2023-10-11 LAB — TSH: TSH: 26.21 u[IU]/mL — ABNORMAL HIGH (ref 0.35–5.50)

## 2023-10-11 LAB — COMPREHENSIVE METABOLIC PANEL
ALT: 16 U/L (ref 0–53)
AST: 18 U/L (ref 0–37)
Albumin: 4.6 g/dL (ref 3.5–5.2)
Alkaline Phosphatase: 84 U/L (ref 39–117)
BUN: 10 mg/dL (ref 6–23)
CO2: 25 meq/L (ref 19–32)
Calcium: 9.4 mg/dL (ref 8.4–10.5)
Chloride: 104 meq/L (ref 96–112)
Creatinine, Ser: 0.86 mg/dL (ref 0.40–1.50)
GFR: 93.27 mL/min (ref >60.00)
Glucose, Bld: 99 mg/dL (ref 70–99)
Potassium: 4 meq/L (ref 3.5–5.1)
Sodium: 138 meq/L (ref 135–145)
Total Bilirubin: 0.5 mg/dL (ref 0.2–1.2)
Total Protein: 6.8 g/dL (ref 6.0–8.3)

## 2023-10-11 NOTE — Assessment & Plan Note (Addendum)
  Clinically stable on synthroid. Continue same.  Update TSH

## 2023-10-11 NOTE — Assessment & Plan Note (Signed)
Rate stable today. Will request records from cardiology.  I advised pt to follow back up with his cardiologist for further discussion of anticoagulation. We discussed risk of stroke due to AF if not anticoagulated.

## 2023-10-11 NOTE — Telephone Encounter (Signed)
Please request cardiology records from [facility] in [place]. [place].

## 2023-10-11 NOTE — Assessment & Plan Note (Signed)
Continues pantoprazole.  Overdue for follow up with GI. Will arrange follow up.

## 2023-10-11 NOTE — Assessment & Plan Note (Signed)
Lab Results  Component Value Date   CHOL 177 03/07/2023   HDL 40.90 03/07/2023   LDLCALC 103 (H) 03/07/2023   LDLDIRECT 97.0 10/11/2016   TRIG 164.0 (H) 03/07/2023   CHOLHDL 4 03/07/2023  Lipids stable last visit.

## 2023-10-11 NOTE — Assessment & Plan Note (Signed)
BP Readings from Last 3 Encounters:  10/11/23 (!) 161/88  03/07/23 124/75  09/26/22 110/60

## 2023-10-11 NOTE — Assessment & Plan Note (Addendum)
Smokes 1/2 PPD- we have discussed cessation many times.
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
## 2023-10-11 NOTE — Patient Instructions (Signed)
VISIT SUMMARY:  During our visit, we discussed your concerns about your kidney function and recent diagnosis of atrial fibrillation. We also reviewed your well-controlled asthma, Barrett's esophagus, history of squamous cell carcinoma, recent cataract surgery, and stress related to your wife's health issues. We discussed the importance of regular follow-ups with specialists for your conditions and the need for routine health maintenance.  YOUR PLAN:  -ATRIAL FIBRILLATION: Atrial fibrillation is an irregular and often rapid heart rate. We discussed the risks and benefits of blood thinners. I recommend you see a cardiologist to discuss this further.  -POLYCYSTIC KIDNEY DISEASE:I have ordered a kidney profile to assess your current kidney function.  -ASTHMA: Your asthma, a condition that narrows your airways and makes it harder to breathe, is well-controlled with montelukast. Continue taking this medication.  -BARRETT'S ESOPHAGUS: Barrett's esophagus is a condition where the tissue lining the esophagus changes to tissue similar to the lining of the intestine. I recommend a follow-up with a gastroenterologist for potential esophageal dilation.  -SQUAMOUS CELL CARCINOMA: You have a history of squamous cell carcinoma, a type of skin cancer, on your left arm which was removed by your dermatologist . Continue with routine skin checks.  -CATARACTS: You recently had surgery for cataracts, a clouding of the eye's natural lens, in your left eye. Continue with the post-operative care as directed by your ophthalmologist.  -HYPERTENSION: You have high blood pressure, which you are managing with amlodipine and benazepril. Continue taking these medications and monitor your blood pressure.  -THYROID DISORDER: You are on thyroid replacement therapy for a thyroid disorder. I have ordered thyroid function tests.  -TOBACCO USE: You report smoking half a pack of cigarettes per day. I strongly encourage you to quit  smoking for your overall health.  -GENERAL HEALTH MAINTENANCE: For your general health, consider getting a COVID-19 booster vaccine. I have also ordered a chest x-ray as you requested.  INSTRUCTIONS:  Please schedule follow-up appointments with a cardiologist, nephrologist, and gastroenterologist. Continue taking your current medications and monitor your blood pressure. Please also consider quitting smoking and getting a COVID-19 booster vaccine. We will contact you with the results of your kidney profile and thyroid function tests.

## 2023-10-11 NOTE — Assessment & Plan Note (Signed)
Lab Results  Component Value Date   PSA 0.28 03/07/2023   PSA 0.35 04/22/2022   PSA 0.13 01/12/2018   Stable on flomax.

## 2023-10-11 NOTE — Assessment & Plan Note (Signed)
Stable on advair and singulair.  Has albuterol for PRN use.

## 2023-10-11 NOTE — Assessment & Plan Note (Signed)
Update renal function.

## 2023-10-12 ENCOUNTER — Encounter: Payer: Self-pay | Admitting: Family

## 2023-10-12 DIAGNOSIS — E039 Hypothyroidism, unspecified: Secondary | ICD-10-CM

## 2023-10-13 NOTE — Telephone Encounter (Signed)
[facility] (Cardiology, Internal Medicine & [facility]): [phone]  Called but no answer, need fax number for request

## 2023-10-16 NOTE — Telephone Encounter (Signed)
Electronic request sent to [phone]

## 2023-10-17 NOTE — Telephone Encounter (Signed)
Called patient but no answer, left voice mail for patient to call back.   

## 2023-10-17 NOTE — Telephone Encounter (Signed)
Please contact pt and let him know that it looks like he needs more thyroid medication. Has he missed any doses? If so, restart daily synthroid.  If he has been taking daily then we should increase the synthroid dose to once daily #90 tabs.  Repeat TSH either way in 6 weeks.   Kidney function is normal.

## 2023-10-18 NOTE — Telephone Encounter (Signed)
Called patient on all numbers listed but no answer, lvm again

## 2023-10-26 ENCOUNTER — Other Ambulatory Visit: Payer: Self-pay | Admitting: Family

## 2023-10-26 DIAGNOSIS — K219 Gastro-esophageal reflux disease without esophagitis: Secondary | ICD-10-CM

## 2023-10-30 MED ORDER — LEVOTHYROXINE SODIUM 175 MCG PO TABS
175.0000 ug | ORAL_TABLET | Freq: Every day | ORAL | 0 refills | Status: DC
Start: 2023-10-30 — End: 2024-01-26

## 2024-01-26 ENCOUNTER — Other Ambulatory Visit: Payer: Self-pay | Admitting: Family

## 2024-01-26 DIAGNOSIS — E039 Hypothyroidism, unspecified: Secondary | ICD-10-CM

## 2024-01-28 MED ORDER — LEVOTHYROXINE SODIUM 175 MCG PO TABS: 175.0000 ug | ORAL_TABLET | Freq: Every day | ORAL | 0 refills | Status: DC

## 2024-01-28 NOTE — Telephone Encounter (Signed)
Pt overdue for TSH recheck.  Please schedule lab visit.

## 2024-01-30 NOTE — Telephone Encounter (Signed)
Patient has scheduled follow up appointment 02/13/24

## 2024-02-13 ENCOUNTER — Ambulatory Visit (INDEPENDENT_AMBULATORY_CARE_PROVIDER_SITE_OTHER): Payer: Medicare HMO | Admitting: Family

## 2024-02-13 ENCOUNTER — Encounter: Payer: Self-pay | Admitting: Family

## 2024-02-13 ENCOUNTER — Telehealth: Payer: Self-pay | Admitting: Family

## 2024-02-13 VITALS — BP 129/87 | HR 79 | Temp 97.8°F | Resp 16 | Ht 67.0 in | Wt 219.0 lb

## 2024-02-13 DIAGNOSIS — E785 Hyperlipidemia, unspecified: Secondary | ICD-10-CM

## 2024-02-13 DIAGNOSIS — K219 Gastro-esophageal reflux disease without esophagitis: Secondary | ICD-10-CM | POA: Diagnosis not present

## 2024-02-13 DIAGNOSIS — E039 Hypothyroidism, unspecified: Secondary | ICD-10-CM | POA: Diagnosis not present

## 2024-02-13 DIAGNOSIS — K22719 Barrett's esophagus with dysplasia, unspecified: Secondary | ICD-10-CM

## 2024-02-13 DIAGNOSIS — I639 Cerebral infarction, unspecified: Secondary | ICD-10-CM

## 2024-02-13 DIAGNOSIS — I10 Essential (primary) hypertension: Secondary | ICD-10-CM

## 2024-02-13 DIAGNOSIS — Q613 Polycystic kidney, unspecified: Secondary | ICD-10-CM

## 2024-02-13 DIAGNOSIS — N401 Enlarged prostate with lower urinary tract symptoms: Secondary | ICD-10-CM

## 2024-02-13 DIAGNOSIS — G4733 Obstructive sleep apnea (adult) (pediatric): Secondary | ICD-10-CM

## 2024-02-13 DIAGNOSIS — I4891 Unspecified atrial fibrillation: Secondary | ICD-10-CM

## 2024-02-13 DIAGNOSIS — J45909 Unspecified asthma, uncomplicated: Secondary | ICD-10-CM

## 2024-02-13 DIAGNOSIS — Z8673 Personal history of transient ischemic attack (TIA), and cerebral infarction without residual deficits: Secondary | ICD-10-CM

## 2024-02-13 DIAGNOSIS — Z1211 Encounter for screening for malignant neoplasm of colon: Secondary | ICD-10-CM

## 2024-02-13 DIAGNOSIS — Z720 Tobacco use: Secondary | ICD-10-CM

## 2024-02-13 LAB — COMPREHENSIVE METABOLIC PANEL
ALT: 11 U/L (ref 0–53)
AST: 13 U/L (ref 0–37)
Albumin: 4.7 g/dL (ref 3.5–5.2)
Alkaline Phosphatase: 84 U/L (ref 39–117)
BUN: 11 mg/dL (ref 6–23)
CO2: 29 meq/L (ref 19–32)
Calcium: 9.4 mg/dL (ref 8.4–10.5)
Chloride: 101 meq/L (ref 96–112)
Creatinine, Ser: 0.83 mg/dL (ref 0.40–1.50)
GFR: 94.05 mL/min (ref >60.00)
Glucose, Bld: 82 mg/dL (ref 70–99)
Potassium: 4.3 meq/L (ref 3.5–5.1)
Sodium: 138 meq/L (ref 135–145)
Total Bilirubin: 0.6 mg/dL (ref 0.2–1.2)
Total Protein: 7 g/dL (ref 6.0–8.3)

## 2024-02-13 LAB — LIPID PANEL
Cholesterol: 171 mg/dL (ref 0–200)
HDL: 42.1 mg/dL (ref >39.00)
LDL Cholesterol: 109 mg/dL — ABNORMAL HIGH (ref 0–99)
NonHDL: 128.92
Total CHOL/HDL Ratio: 4
Triglycerides: 101 mg/dL (ref 0.0–149.0)
VLDL: 20.2 mg/dL (ref 0.0–40.0)

## 2024-02-13 LAB — TSH: TSH: 4.26 u[IU]/mL (ref 0.35–5.50)

## 2024-02-13 NOTE — Assessment & Plan Note (Signed)
Lab Results  Component Value Date   CHOL 177 03/07/2023   HDL 40.90 03/07/2023   LDLCALC 103 (H) 03/07/2023   LDLDIRECT 97.0 10/11/2016   TRIG 164.0 (H) 03/07/2023   CHOLHDL 4 03/07/2023   Maintained on Mevacor, update Lipid panel.

## 2024-02-13 NOTE — Assessment & Plan Note (Signed)
 Mild, not on CPAP

## 2024-02-13 NOTE — Assessment & Plan Note (Signed)
BP stable, continues amlodipine and benazapril. Continue same.

## 2024-02-13 NOTE — Assessment & Plan Note (Signed)
Feels ok on current dose of synthroid. Update TSH.

## 2024-02-13 NOTE — Assessment & Plan Note (Signed)
Declines anticoagulation

## 2024-02-13 NOTE — Assessment & Plan Note (Signed)
 Continue PPI ?

## 2024-02-13 NOTE — Assessment & Plan Note (Signed)
Reports gerd symptoms are stable.

## 2024-02-13 NOTE — Assessment & Plan Note (Signed)
 Update renal function.

## 2024-02-13 NOTE — Assessment & Plan Note (Signed)
Stable on Advair. Continue same.
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
## 2024-02-13 NOTE — Assessment & Plan Note (Signed)
Voiding well with flomax.  Continue same.

## 2024-02-13 NOTE — Assessment & Plan Note (Signed)
Reports that he is not ready to quit smoking.    Lung Cancer Screening offered to patient. Patient declined screening due to personal preference. -No further action.

## 2024-02-15 NOTE — Patient Instructions (Signed)
VISIT SUMMARY:  Today, we discussed your medication management and addressed your concerns about receiving your medications prepackaged. We also reviewed your conditions, including atrial fibrillation, asthma, and an enlarged prostate. Additionally, we talked about the need for a colonoscopy and pending thyroid function tests.  YOUR PLAN:  -MEDICATION MANAGEMENT: You have had difficulty managing your medications, so we will transfer your prescriptions to [facility], which will provide them prepackaged for daily use.  -ATRIAL FIBRILLATION: Atrial fibrillation is an irregular and often rapid heart rate. You have chosen not to take blood thinners, and we will continue with your current management plan.  -ASTHMA: Asthma is a condition where your airways narrow and swell, making breathing difficult. Your asthma is well controlled with Advair and your inhaler, so we will continue your current regimen.  -ENLARGED PROSTATE: An enlarged prostate can cause urinary issues, but you currently have no symptoms. We will continue with your current management plan.  -COLON CANCER SCREENING: Colon cancer screening is important for early detection of colon cancer. You agreed to have a colonoscopy, and we will send a referral to Dr. Tora Duck at [facility].  -THYROID FUNCTION: We are waiting for your lab results to determine if your Synthroid dose needs adjustment. We will review the results and make changes if necessary.  INSTRUCTIONS:  Please follow up in 6 months. We will also send a referral for your colonoscopy to Dr. Tora Duck at [facility]. Once your thyroid function lab results are available, we will review them and adjust your Synthroid dose if needed.

## 2024-02-15 NOTE — Assessment & Plan Note (Signed)
Continues mevacor.  Allergic to aspirin.

## 2024-02-16 ENCOUNTER — Other Ambulatory Visit (HOSPITAL_COMMUNITY): Payer: Self-pay

## 2024-02-16 MED ORDER — PANTOPRAZOLE SODIUM 40 MG PO TBEC
40.0000 mg | DELAYED_RELEASE_TABLET | Freq: Every morning | ORAL | 1 refills | Status: DC
  Filled 2024-02-16: qty 90, 90d supply, fill #0
  Filled 2024-02-19 – 2024-02-20 (×2): qty 30, 30d supply, fill #0

## 2024-02-16 MED ORDER — LEVOTHYROXINE SODIUM 175 MCG PO TABS
175.0000 ug | ORAL_TABLET | Freq: Every day | ORAL | 0 refills | Status: DC
  Filled 2024-02-16: qty 90, 90d supply, fill #0
  Filled 2024-02-19 – 2024-02-20 (×2): qty 30, 30d supply, fill #0

## 2024-02-16 MED ORDER — LOVASTATIN 40 MG PO TABS
40.0000 mg | ORAL_TABLET | Freq: Every day | ORAL | 1 refills | Status: DC
  Filled 2024-02-16: qty 90, 90d supply, fill #0
  Filled 2024-02-19 – 2024-02-20 (×2): qty 30, 30d supply, fill #0

## 2024-02-16 MED ORDER — AMLODIPINE BESYLATE 10 MG PO TABS
10.0000 mg | ORAL_TABLET | Freq: Every morning | ORAL | 1 refills | Status: DC
  Filled 2024-02-16: qty 90, 90d supply, fill #0
  Filled 2024-02-19 – 2024-02-20 (×2): qty 30, 30d supply, fill #0

## 2024-02-16 MED ORDER — FUROSEMIDE 20 MG PO TABS
20.0000 mg | ORAL_TABLET | Freq: Every day | ORAL | 1 refills | Status: AC
  Filled 2024-02-16: qty 90, 90d supply, fill #0
  Filled 2024-02-19 – 2024-02-20 (×2): qty 30, 30d supply, fill #0

## 2024-02-16 MED ORDER — ROPINIROLE HCL 0.5 MG PO TABS
0.5000 mg | ORAL_TABLET | Freq: Every day | ORAL | 1 refills | Status: DC
  Filled 2024-02-16: qty 90, 90d supply, fill #0
  Filled 2024-02-19 – 2024-02-20 (×2): qty 30, 30d supply, fill #0

## 2024-02-16 MED ORDER — MONTELUKAST SODIUM 10 MG PO TABS
10.0000 mg | ORAL_TABLET | Freq: Every day | ORAL | 1 refills | Status: DC
  Filled 2024-02-16: qty 90, 90d supply, fill #0
  Filled 2024-02-19 – 2024-02-20 (×2): qty 30, 30d supply, fill #0

## 2024-02-16 MED ORDER — BENAZEPRIL HCL 20 MG PO TABS
20.0000 mg | ORAL_TABLET | Freq: Every day | ORAL | 1 refills | Status: AC
  Filled 2024-02-16: qty 90, 90d supply, fill #0
  Filled 2024-02-19 – 2024-02-20 (×2): qty 30, 30d supply, fill #0

## 2024-02-16 MED ORDER — TAMSULOSIN HCL 0.4 MG PO CAPS
0.4000 mg | ORAL_CAPSULE | Freq: Every day | ORAL | 1 refills | Status: DC
  Filled 2024-02-16 (×2): qty 90, 90d supply, fill #0
  Filled 2024-02-19 – 2024-02-20 (×2): qty 30, 30d supply, fill #0

## 2024-02-16 NOTE — Addendum Note (Signed)
Addended by: [name] on: 02/16/2024 03:57 PM   Modules accepted: Orders

## 2024-02-19 ENCOUNTER — Other Ambulatory Visit (HOSPITAL_COMMUNITY): Payer: Self-pay

## 2024-02-19 ENCOUNTER — Other Ambulatory Visit: Payer: Self-pay

## 2024-02-20 ENCOUNTER — Other Ambulatory Visit (HOSPITAL_BASED_OUTPATIENT_CLINIC_OR_DEPARTMENT_OTHER): Payer: Self-pay

## 2024-02-20 ENCOUNTER — Other Ambulatory Visit: Payer: Self-pay

## 2024-02-20 ENCOUNTER — Other Ambulatory Visit (HOSPITAL_COMMUNITY): Payer: Self-pay

## 2024-02-20 NOTE — Telephone Encounter (Signed)
 Opened in error

## 2024-02-21 ENCOUNTER — Other Ambulatory Visit: Payer: Self-pay

## 2024-02-22 ENCOUNTER — Other Ambulatory Visit: Payer: Self-pay

## 2024-02-26 ENCOUNTER — Other Ambulatory Visit: Payer: Self-pay

## 2024-02-29 ENCOUNTER — Other Ambulatory Visit: Payer: Self-pay

## 2024-03-01 ENCOUNTER — Other Ambulatory Visit: Payer: Self-pay

## 2024-03-06 ENCOUNTER — Other Ambulatory Visit: Payer: Self-pay

## 2024-03-08 ENCOUNTER — Other Ambulatory Visit: Payer: Self-pay

## 2024-03-11 ENCOUNTER — Other Ambulatory Visit: Payer: Self-pay

## 2024-03-11 ENCOUNTER — Other Ambulatory Visit (HOSPITAL_COMMUNITY): Payer: Self-pay

## 2024-03-27 ENCOUNTER — Telehealth: Payer: Self-pay | Admitting: Emergency Medicine

## 2024-03-27 ENCOUNTER — Other Ambulatory Visit: Payer: Self-pay

## 2024-03-27 DIAGNOSIS — E039 Hypothyroidism, unspecified: Secondary | ICD-10-CM

## 2024-03-27 MED ORDER — LEVOTHYROXINE SODIUM 175 MCG PO TABS: 175.0000 ug | ORAL_TABLET | Freq: Every day | ORAL | 1 refills | Status: AC

## 2024-03-27 MED ORDER — ADVAIR DISKUS 500-50 MCG/ACT IN AEPB
1.0000 | INHALATION_SPRAY | Freq: Two times a day (BID) | RESPIRATORY_TRACT | 11 refills | Status: AC
Start: 2024-03-27 — End: ?

## 2024-03-27 NOTE — Telephone Encounter (Signed)
 Copied from CRM [id number]. Topic: Clinical - Prescription Issue >> Mar 27, 2024 11:35 AM Kathryne Eriksson wrote: Reason for CRM: Prior Authorization

## 2024-03-27 NOTE — Telephone Encounter (Signed)
 Patient requested refill for levothyroxine and Advair. Both were sent to [facility] in [place]

## 2024-03-29 ENCOUNTER — Other Ambulatory Visit: Payer: Self-pay

## 2024-04-12 ENCOUNTER — Other Ambulatory Visit: Payer: Self-pay

## 2024-04-24 ENCOUNTER — Other Ambulatory Visit: Payer: Self-pay | Admitting: Family

## 2024-04-29 ENCOUNTER — Other Ambulatory Visit: Payer: Self-pay | Admitting: Family

## 2024-04-29 DIAGNOSIS — K219 Gastro-esophageal reflux disease without esophagitis: Secondary | ICD-10-CM

## 2024-05-21 ENCOUNTER — Telehealth: Payer: Self-pay | Admitting: Family

## 2024-05-21 NOTE — Telephone Encounter (Unsigned)
 Copied from CRM [id number]. Topic: Clinical - Medication Refill >> May 21, 2024 10:46 AM [place] J wrote: Medication: ipratropium (ATROVENT  HFA) 17 MCG/ACT inhaler  Has the patient contacted their pharmacy? No (Agent: If no, request that the patient contact the pharmacy for the refill. If patient does not wish to contact the pharmacy document the reason why and proceed with request.) (Agent: If yes, when and what did the pharmacy advise?)  This is the patient's preferred pharmacy:   [facility] [phone] - [place], [place] - [address] AT [facility] [address] [place] [address] Phone: [phone] Fax: [phone]  Is this the correct pharmacy for this prescription? Yes If no, delete pharmacy and type the correct one.   Has the prescription been filled recently? No  Is the patient out of the medication? Yes  Has the patient been seen for an appointment in the last year OR does the patient have an upcoming appointment? Yes  Can we respond through MyChart? No  Agent: Please be advised that Rx refills may take up to 3 business days. We ask that you follow-up with your pharmacy.

## 2024-05-22 MED ORDER — ATROVENT HFA 17 MCG/ACT IN AERS: 2.0000 | INHALATION_SPRAY | Freq: Four times a day (QID) | RESPIRATORY_TRACT | 5 refills | Status: AC | PRN

## 2024-06-03 ENCOUNTER — Telehealth: Payer: Self-pay | Admitting: Family
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
## 2024-06-04 NOTE — Telephone Encounter (Signed)
 Patient has been dismissed.

## 2024-08-14 ENCOUNTER — Ambulatory Visit: Payer: Medicare HMO | Admitting: Family
